# Patient Record
Sex: Male | Born: 1955 | Race: White | Hispanic: No | Smoking: Never smoker
Health system: Southern US, Community
[De-identification: ages and names within clinical notes are randomized; demographics above are authoritative.]

## PROBLEM LIST (undated history)

## (undated) DIAGNOSIS — K625 Hemorrhage of anus and rectum: Secondary | ICD-10-CM

## (undated) DIAGNOSIS — C099 Malignant neoplasm of tonsil, unspecified: Secondary | ICD-10-CM

## (undated) DIAGNOSIS — F1910 Other psychoactive substance abuse, uncomplicated: Secondary | ICD-10-CM

## (undated) DIAGNOSIS — F10951 Alcohol use, unspecified with alcohol-induced psychotic disorder with hallucinations: Secondary | ICD-10-CM

## (undated) DIAGNOSIS — C3490 Malignant neoplasm of unspecified part of unspecified bronchus or lung: Secondary | ICD-10-CM

## (undated) DIAGNOSIS — R5383 Other fatigue: Secondary | ICD-10-CM

## (undated) DIAGNOSIS — N529 Male erectile dysfunction, unspecified: Secondary | ICD-10-CM

## (undated) DIAGNOSIS — E7800 Pure hypercholesterolemia, unspecified: Secondary | ICD-10-CM

## (undated) DIAGNOSIS — C8590 Non-Hodgkin lymphoma, unspecified, unspecified site: Secondary | ICD-10-CM

## (undated) DIAGNOSIS — Z8701 Personal history of pneumonia (recurrent): Secondary | ICD-10-CM

## (undated) DIAGNOSIS — I10 Essential (primary) hypertension: Secondary | ICD-10-CM

## (undated) DIAGNOSIS — F10251 Alcohol dependence with alcohol-induced psychotic disorder with hallucinations: Secondary | ICD-10-CM

## (undated) HISTORY — DX: Other fatigue: R53.83

## (undated) HISTORY — DX: Malignant neoplasm of tonsil, unspecified: C09.9

## (undated) HISTORY — DX: Other psychoactive substance abuse, uncomplicated: F19.10

## (undated) HISTORY — DX: Hemorrhage of anus and rectum: K62.5

## (undated) HISTORY — DX: Alcohol use, unspecified with alcohol-induced psychotic disorder with hallucinations: F10.951

## (undated) HISTORY — DX: Personal history of pneumonia (recurrent): Z87.01

## (undated) HISTORY — PX: JOINT REPLACEMENT: SHX530

## (undated) HISTORY — DX: Alcohol dependence with alcohol-induced psychotic disorder with hallucinations: F10.251

---
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
## 2006-10-11 DIAGNOSIS — C8590 Non-Hodgkin lymphoma, unspecified, unspecified site: Secondary | ICD-10-CM

## 2006-10-11 HISTORY — PX: TONSILLECTOMY: SUR1361

## 2006-10-11 HISTORY — DX: Non-Hodgkin lymphoma, unspecified, unspecified site: C85.90

## 2007-10-12 HISTORY — PX: REPLACEMENT TOTAL KNEE BILATERAL: SUR1225

## 2017-10-08 ENCOUNTER — Encounter: Payer: Self-pay | Admitting: Gynecology

## 2017-10-08 ENCOUNTER — Other Ambulatory Visit: Payer: Self-pay

## 2017-10-08 ENCOUNTER — Ambulatory Visit
Admission: EM | Admit: 2017-10-08 | Discharge: 2017-10-08 | Disposition: A | Discharge status: Home / Self Care | Payer: Self-pay | Attending: Family Medicine | Admitting: Family Medicine

## 2017-10-08 DIAGNOSIS — E785 Hyperlipidemia, unspecified: Secondary | ICD-10-CM

## 2017-10-08 DIAGNOSIS — M25511 Pain in right shoulder: Secondary | ICD-10-CM

## 2017-10-08 DIAGNOSIS — G8929 Other chronic pain: Secondary | ICD-10-CM

## 2017-10-08 DIAGNOSIS — B001 Herpesviral vesicular dermatitis: Secondary | ICD-10-CM

## 2017-10-08 DIAGNOSIS — I10 Essential (primary) hypertension: Secondary | ICD-10-CM

## 2017-10-08 DIAGNOSIS — N528 Other male erectile dysfunction: Secondary | ICD-10-CM

## 2017-10-08 HISTORY — DX: Male erectile dysfunction, unspecified: N52.9

## 2017-10-08 HISTORY — DX: Essential (primary) hypertension: I10

## 2017-10-08 HISTORY — DX: Pure hypercholesterolemia, unspecified: E78.00

## 2017-10-08 MED ORDER — TADALAFIL 5 MG PO TABS: 5.0000 mg | ORAL_TABLET | Freq: Every day | ORAL | 0 refills | Status: DC | PRN

## 2017-10-08 MED ORDER — ATORVASTATIN CALCIUM 40 MG PO TABS: 40.0000 mg | ORAL_TABLET | Freq: Every day | ORAL | 0 refills | Status: DC

## 2017-10-08 MED ORDER — BENAZEPRIL HCL 40 MG PO TABS: 40.0000 mg | ORAL_TABLET | Freq: Every day | ORAL | 0 refills | Status: DC

## 2017-10-08 MED ORDER — AMLODIPINE BESYLATE 10 MG PO TABS: 10.0000 mg | ORAL_TABLET | Freq: Every day | ORAL | 0 refills | Status: DC

## 2017-10-08 MED ORDER — ACYCLOVIR 400 MG PO TABS: 400.0000 mg | ORAL_TABLET | Freq: Every day | ORAL | 0 refills | Status: DC

## 2017-10-08 MED ORDER — TRAMADOL HCL 50 MG PO TABS: 50.0000 mg | ORAL_TABLET | Freq: Four times a day (QID) | ORAL | 0 refills | Status: DC | PRN

## 2017-10-08 NOTE — Discharge Instructions (Signed)
Establish care with a Primary Care Physician/Provider ASAP for chronic medical conditions management and follow up

## 2017-10-08 NOTE — ED Provider Notes (Signed)
[facility]    CSN: [id number] Arrival date & time: 10/08/17  1145     History   Chief Complaint Chief Complaint  Patient presents with  . Shoulder Pain  . Medication Refill    HPI George Wade is a 61 y.o. male.   61 yo male here with a c/o medication refills on his chronic medications for his chronic conditions. Patient states he just moved here and has not set up care with a PCP but is out of his chronic meds. States takes tramadol for a right shoulder rotator cuff tear work injury. (brings in note from out of state orthopedist patient was seeing). Chronic medical problems as per note.    The history is provided by the patient.  Shoulder Pain  Medication Refill    Past Medical History:  Diagnosis Date  . ED (erectile dysfunction)   . Hypercholesteremia   . Hypertension     There are no active problems to display for this patient.   History reviewed. No pertinent surgical history.     Home Medications    Prior to Admission medications   Medication Sig Start Date End Date Taking? Authorizing Provider  acyclovir (ZOVIRAX) 400 MG tablet Take 1 tablet (400 mg total) by mouth 5 (five) times daily. 10/08/17   Norval Gable, MD  amLODipine (NORVASC) 10 MG tablet Take 1 tablet (10 mg total) by mouth daily. 10/08/17   Norval Gable, MD  atorvastatin (LIPITOR) 40 MG tablet Take 1 tablet (40 mg total) by mouth daily. 10/08/17   Norval Gable, MD  benazepril (LOTENSIN) 40 MG tablet Take 1 tablet (40 mg total) by mouth daily. 10/08/17   Norval Gable, MD  tadalafil (CIALIS) 5 MG tablet Take 1 tablet (5 mg total) by mouth daily as needed for erectile dysfunction. 10/08/17   Norval Gable, MD  traMADol (ULTRAM) 50 MG tablet Take 1 tablet (50 mg total) by mouth every 6 (six) hours as needed. 10/08/17   Norval Gable, MD    Family History Family History  Problem Relation Age of Onset  . Hypertension Father     Social History Social History    Tobacco Use  . Smoking status: Never Smoker  . Smokeless tobacco: Never Used  Substance Use Topics  . Alcohol use: Yes  . Drug use: No     Allergies   Patient has no known allergies.   Review of Systems Review of Systems   Physical Exam Triage Vital Signs ED Triage Vitals  Enc Vitals Group     BP 10/08/17 1210 (!) 131/100     Pulse Rate 10/08/17 1210 97     Resp 10/08/17 1210 16     Temp 10/08/17 1210 99 F (37.2 C)     Temp Source 10/08/17 1210 Oral     SpO2 10/08/17 1210 98 %     Weight 10/08/17 1203 175 lb (79.4 kg)     Height 10/08/17 1203 5\' 9"  (1.753 m)     Head Circumference --      Peak Flow --      Pain Score 10/08/17 1203 7     Pain Loc --      Pain Edu? --      Excl. in [place]? --    No data found.  Updated Vital Signs BP (!) 131/100 (BP Location: Left Arm)   Pulse 97   Temp 99 F (37.2 C) (Oral)   Resp 16   Ht 5\' 9"  (1.753 m)  Wt 175 lb (79.4 kg)   SpO2 98%   BMI 25.84 kg/m   Visual Acuity Right Eye Distance:   Left Eye Distance:   Bilateral Distance:    Right Eye Near:   Left Eye Near:    Bilateral Near:     Physical Exam  Constitutional: He appears well-developed and well-nourished. No distress.  Cardiovascular: Normal rate, regular rhythm and normal heart sounds.  Pulmonary/Chest: Effort normal and breath sounds normal. No stridor. No respiratory distress. He has no wheezes. He has no rales.  Skin: He is not diaphoretic.  Nursing note and vitals reviewed.    UC Treatments / Results  Labs (all labs ordered are listed, but only abnormal results are displayed) Labs Reviewed - No data to display  EKG  EKG Interpretation None       Radiology No results found.  Procedures Procedures (including critical care time)  Medications Ordered in UC Medications - No data to display   Initial Impression / Assessment and Plan / UC Course  I have reviewed the triage vital signs and the nursing notes.  Pertinent labs & imaging  results that were available during my care of the patient were reviewed by me and considered in my medical decision making (see chart for details).       Final Clinical Impressions(s) / UC Diagnoses   Final diagnoses:  Essential hypertension  Hyperlipidemia, unspecified hyperlipidemia type  Chronic right shoulder pain  Other male erectile dysfunction  Recurrent cold sores    ED Discharge Orders        Ordered    acyclovir (ZOVIRAX) 400 MG tablet  5 times daily     10/08/17 1310    amLODipine (NORVASC) 10 MG tablet  Daily     10/08/17 1310    atorvastatin (LIPITOR) 40 MG tablet  Daily     10/08/17 1310    benazepril (LOTENSIN) 40 MG tablet  Daily     10/08/17 1310    traMADol (ULTRAM) 50 MG tablet  Every 6 hours PRN     10/08/17 1310    tadalafil (CIALIS) 5 MG tablet  Daily PRN     10/08/17 1310     1. diagnosis reviewed with patient 2. rx as per orders above; reviewed possible side effects, interactions, risks and benefits; meds refilled x 1 month only 3. Discussed with patient need to establish care with PCP locally for continuing management of his chronic medical problems  Controlled Substance Prescriptions [place] Controlled Substance Registry consulted? Not Applicable   Norval Gable, MD 10/08/17 1416
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
## 2017-12-09 ENCOUNTER — Inpatient Hospital Stay: Payer: Self-pay

## 2017-12-09 ENCOUNTER — Emergency Department: Payer: Self-pay

## 2017-12-09 ENCOUNTER — Other Ambulatory Visit: Payer: Self-pay

## 2017-12-09 ENCOUNTER — Encounter: Payer: Self-pay | Admitting: Emergency Medicine

## 2017-12-09 ENCOUNTER — Inpatient Hospital Stay
Admission: EM | Admit: 2017-12-09 | Discharge: 2017-12-12 | DRG: 897 | Disposition: A | Discharge status: Home / Self Care | Payer: Self-pay | Attending: Family Medicine | Admitting: Family Medicine

## 2017-12-09 DIAGNOSIS — E871 Hypo-osmolality and hyponatremia: Secondary | ICD-10-CM

## 2017-12-09 DIAGNOSIS — R0602 Shortness of breath: Secondary | ICD-10-CM | POA: Diagnosis present

## 2017-12-09 DIAGNOSIS — Z85118 Personal history of other malignant neoplasm of bronchus and lung: Secondary | ICD-10-CM

## 2017-12-09 DIAGNOSIS — Z79899 Other long term (current) drug therapy: Secondary | ICD-10-CM

## 2017-12-09 DIAGNOSIS — E876 Hypokalemia: Secondary | ICD-10-CM | POA: Diagnosis present

## 2017-12-09 DIAGNOSIS — I10 Essential (primary) hypertension: Secondary | ICD-10-CM | POA: Diagnosis present

## 2017-12-09 DIAGNOSIS — R911 Solitary pulmonary nodule: Secondary | ICD-10-CM | POA: Diagnosis present

## 2017-12-09 DIAGNOSIS — F10239 Alcohol dependence with withdrawal, unspecified: Principal | ICD-10-CM | POA: Diagnosis present

## 2017-12-09 DIAGNOSIS — Y92239 Unspecified place in hospital as the place of occurrence of the external cause: Secondary | ICD-10-CM | POA: Diagnosis present

## 2017-12-09 DIAGNOSIS — E785 Hyperlipidemia, unspecified: Secondary | ICD-10-CM | POA: Diagnosis present

## 2017-12-09 DIAGNOSIS — W19XXXA Unspecified fall, initial encounter: Secondary | ICD-10-CM | POA: Diagnosis not present

## 2017-12-09 DIAGNOSIS — Z23 Encounter for immunization: Secondary | ICD-10-CM

## 2017-12-09 DIAGNOSIS — F10939 Alcohol use, unspecified with withdrawal, unspecified: Secondary | ICD-10-CM | POA: Diagnosis present

## 2017-12-09 DIAGNOSIS — F1010 Alcohol abuse, uncomplicated: Secondary | ICD-10-CM

## 2017-12-09 HISTORY — DX: Malignant neoplasm of unspecified part of unspecified bronchus or lung: C34.90

## 2017-12-09 LAB — INFLUENZA PANEL BY PCR (TYPE A & B)
INFLAPCR: NEGATIVE
Influenza B By PCR: NEGATIVE

## 2017-12-09 LAB — CBC
HCT: 37.7 % — ABNORMAL LOW (ref 40.0–52.0)
Hemoglobin: 13.2 g/dL (ref 13.0–18.0)
MCH: 33.3 pg (ref 26.0–34.0)
MCHC: 35 g/dL (ref 32.0–36.0)
MCV: 95.4 fL (ref 80.0–100.0)
Platelets: 191 10*3/uL (ref 150–440)
RBC: 3.95 MIL/uL — ABNORMAL LOW (ref 4.40–5.90)
RDW: 13.4 % (ref 11.5–14.5)
WBC: 8.3 10*3/uL (ref 3.8–10.6)

## 2017-12-09 LAB — COMPREHENSIVE METABOLIC PANEL
ALT: 89 U/L — ABNORMAL HIGH (ref 17–63)
ANION GAP: 12 (ref 5–15)
AST: 97 U/L — ABNORMAL HIGH (ref 15–41)
Albumin: 3.9 g/dL (ref 3.5–5.0)
Alkaline Phosphatase: 61 U/L (ref 38–126)
BUN: 11 mg/dL (ref 6–20)
CHLORIDE: 87 mmol/L — AB (ref 101–111)
CO2: 24 mmol/L (ref 22–32)
Calcium: 8.6 mg/dL — ABNORMAL LOW (ref 8.9–10.3)
Creatinine, Ser: 0.71 mg/dL (ref 0.61–1.24)
Glucose, Bld: 136 mg/dL — ABNORMAL HIGH (ref 65–99)
POTASSIUM: 3.1 mmol/L — AB (ref 3.5–5.1)
Sodium: 123 mmol/L — ABNORMAL LOW (ref 135–145)
Total Bilirubin: 1.6 mg/dL — ABNORMAL HIGH (ref 0.3–1.2)
Total Protein: 6.6 g/dL (ref 6.5–8.1)

## 2017-12-09 LAB — URINE DRUG SCREEN, QUALITATIVE (ARMC ONLY)
AMPHETAMINES, UR SCREEN: NOT DETECTED
BENZODIAZEPINE, UR SCRN: NOT DETECTED
Barbiturates, Ur Screen: NOT DETECTED
Cannabinoid 50 Ng, Ur ~~LOC~~: NOT DETECTED
Cocaine Metabolite,Ur ~~LOC~~: NOT DETECTED
MDMA (ECSTASY) UR SCREEN: NOT DETECTED
Methadone Scn, Ur: NOT DETECTED
Opiate, Ur Screen: NOT DETECTED
Phencyclidine (PCP) Ur S: NOT DETECTED
TRICYCLIC, UR SCREEN: NOT DETECTED

## 2017-12-09 LAB — MAGNESIUM: Magnesium: 1.6 mg/dL — ABNORMAL LOW (ref 1.7–2.4)

## 2017-12-09 LAB — TSH: TSH: 1.186 u[IU]/mL (ref 0.350–4.500)

## 2017-12-09 LAB — ETHANOL: Alcohol, Ethyl (B): <10 mg/dL (ref <10)

## 2017-12-09 LAB — TROPONIN I: Troponin I: 0.03 ng/mL (ref <0.03)

## 2017-12-09 MED ORDER — PHENOL 1.4 % MT LIQD
1.0000 | OROMUCOSAL | Status: DC | PRN
  Filled 2017-12-09: qty 177

## 2017-12-09 MED ORDER — AMLODIPINE BESYLATE 5 MG PO TABS
ORAL_TABLET | ORAL | Status: AC
  Administered 2017-12-09: 10 mg via ORAL
  Filled 2017-12-09: qty 2

## 2017-12-09 MED ORDER — ACETAMINOPHEN 650 MG RE SUPP: 650.0000 mg | Freq: Four times a day (QID) | RECTAL | Status: DC | PRN

## 2017-12-09 MED ORDER — ONDANSETRON HCL 4 MG/2ML IJ SOLN: 4.0000 mg | Freq: Four times a day (QID) | INTRAMUSCULAR | Status: DC | PRN

## 2017-12-09 MED ORDER — POTASSIUM CHLORIDE IN NACL 40-0.9 MEQ/L-% IV SOLN
INTRAVENOUS | Status: DC
  Administered 2017-12-09 – 2017-12-10 (×3): 125 mL/h via INTRAVENOUS
  Filled 2017-12-09 (×5): qty 1000

## 2017-12-09 MED ORDER — POTASSIUM CHLORIDE CRYS ER 20 MEQ PO TBCR
40.0000 meq | EXTENDED_RELEASE_TABLET | ORAL | Status: AC
  Administered 2017-12-09 (×2): 40 meq via ORAL
  Filled 2017-12-09: qty 2

## 2017-12-09 MED ORDER — LORAZEPAM 1 MG PO TABS
1.0000 mg | ORAL_TABLET | Freq: Four times a day (QID) | ORAL | Status: AC | PRN
  Administered 2017-12-11 – 2017-12-12 (×2): 1 mg via ORAL
  Filled 2017-12-09 (×4): qty 1

## 2017-12-09 MED ORDER — ENOXAPARIN SODIUM 40 MG/0.4ML ~~LOC~~ SOLN
40.0000 mg | SUBCUTANEOUS | Status: DC
  Administered 2017-12-09 – 2017-12-11 (×3): 40 mg via SUBCUTANEOUS
  Filled 2017-12-09 (×3): qty 0.4

## 2017-12-09 MED ORDER — IOPAMIDOL (ISOVUE-300) INJECTION 61%
75.0000 mL | Freq: Once | INTRAVENOUS | Status: AC | PRN
  Administered 2017-12-09: 75 mL via INTRAVENOUS

## 2017-12-09 MED ORDER — LORAZEPAM 2 MG/ML IJ SOLN
1.0000 mg | Freq: Four times a day (QID) | INTRAMUSCULAR | Status: AC | PRN
  Administered 2017-12-09 – 2017-12-11 (×4): 1 mg via INTRAVENOUS
  Filled 2017-12-09 (×3): qty 1

## 2017-12-09 MED ORDER — ADULT MULTIVITAMIN W/MINERALS CH
1.0000 | ORAL_TABLET | Freq: Every day | ORAL | Status: DC
  Administered 2017-12-09 – 2017-12-12 (×4): 1 via ORAL
  Filled 2017-12-09 (×3): qty 1

## 2017-12-09 MED ORDER — FOLIC ACID 1 MG PO TABS
1.0000 mg | ORAL_TABLET | Freq: Every day | ORAL | Status: DC
  Administered 2017-12-09 – 2017-12-12 (×4): 1 mg via ORAL
  Filled 2017-12-09 (×3): qty 1

## 2017-12-09 MED ORDER — FOLIC ACID 1 MG PO TABS
ORAL_TABLET | ORAL | Status: AC
  Administered 2017-12-09: 1 mg via ORAL
  Filled 2017-12-09: qty 1

## 2017-12-09 MED ORDER — TRAMADOL HCL 50 MG PO TABS: 50.0000 mg | ORAL_TABLET | Freq: Four times a day (QID) | ORAL | Status: DC | PRN

## 2017-12-09 MED ORDER — INFLUENZA VAC SPLIT QUAD 0.5 ML IM SUSY
0.5000 mL | PREFILLED_SYRINGE | INTRAMUSCULAR | Status: AC
  Administered 2017-12-11: 0.5 mL via INTRAMUSCULAR
  Filled 2017-12-09: qty 0.5

## 2017-12-09 MED ORDER — BENAZEPRIL HCL 40 MG PO TABS: 40.0000 mg | ORAL_TABLET | Freq: Every day | ORAL | 0 refills | Status: DC

## 2017-12-09 MED ORDER — LORAZEPAM 2 MG PO TABS
0.0000 mg | ORAL_TABLET | Freq: Four times a day (QID) | ORAL | Status: AC
  Administered 2017-12-09 (×2): 2 mg via ORAL
  Administered 2017-12-10: 1 mg via ORAL
  Administered 2017-12-10: 2 mg via ORAL
  Filled 2017-12-09 (×4): qty 1

## 2017-12-09 MED ORDER — CEPASTAT 14.5 MG MT LOZG
1.0000 | LOZENGE | OROMUCOSAL | Status: DC | PRN
  Administered 2017-12-09: 1 via BUCCAL
  Filled 2017-12-09: qty 9

## 2017-12-09 MED ORDER — THIAMINE HCL 100 MG/ML IJ SOLN: 100.0000 mg | Freq: Every day | INTRAMUSCULAR | Status: DC

## 2017-12-09 MED ORDER — BENAZEPRIL HCL 20 MG PO TABS
40.0000 mg | ORAL_TABLET | Freq: Every day | ORAL | Status: DC
  Administered 2017-12-10 – 2017-12-12 (×3): 40 mg via ORAL
  Filled 2017-12-09 (×4): qty 2

## 2017-12-09 MED ORDER — SODIUM CHLORIDE 0.9 % IV SOLN
Freq: Once | INTRAVENOUS | Status: AC
  Administered 2017-12-09: 11:00:00 via INTRAVENOUS

## 2017-12-09 MED ORDER — ADULT MULTIVITAMIN W/MINERALS CH
ORAL_TABLET | ORAL | Status: AC
  Administered 2017-12-09: 1 via ORAL
  Filled 2017-12-09: qty 1

## 2017-12-09 MED ORDER — ATORVASTATIN CALCIUM 40 MG PO TABS: 40.0000 mg | ORAL_TABLET | Freq: Every day | ORAL | 0 refills | Status: DC

## 2017-12-09 MED ORDER — LORAZEPAM 1 MG PO TABS
1.0000 mg | ORAL_TABLET | Freq: Once | ORAL | Status: AC
  Administered 2017-12-09: 1 mg via ORAL
  Filled 2017-12-09: qty 1

## 2017-12-09 MED ORDER — POTASSIUM CHLORIDE CRYS ER 20 MEQ PO TBCR
EXTENDED_RELEASE_TABLET | ORAL | Status: AC
  Administered 2017-12-09: 40 meq via ORAL
  Filled 2017-12-09: qty 2

## 2017-12-09 MED ORDER — ATORVASTATIN CALCIUM 20 MG PO TABS
40.0000 mg | ORAL_TABLET | Freq: Every day | ORAL | Status: DC
  Administered 2017-12-09 – 2017-12-11 (×3): 40 mg via ORAL
  Filled 2017-12-09 (×3): qty 2

## 2017-12-09 MED ORDER — VITAMIN B-1 100 MG PO TABS
100.0000 mg | ORAL_TABLET | Freq: Every day | ORAL | Status: DC
  Administered 2017-12-09 – 2017-12-12 (×4): 100 mg via ORAL
  Filled 2017-12-09 (×3): qty 1

## 2017-12-09 MED ORDER — LORAZEPAM 2 MG/ML IJ SOLN
0.0000 mg | Freq: Two times a day (BID) | INTRAMUSCULAR | Status: DC
  Administered 2017-12-11: 2 mg via INTRAVENOUS
  Filled 2017-12-09: qty 1

## 2017-12-09 MED ORDER — AMLODIPINE BESYLATE 10 MG PO TABS: 10.0000 mg | ORAL_TABLET | Freq: Every day | ORAL | 0 refills | Status: DC

## 2017-12-09 MED ORDER — AMLODIPINE BESYLATE 10 MG PO TABS
10.0000 mg | ORAL_TABLET | Freq: Every day | ORAL | Status: DC
  Administered 2017-12-09 – 2017-12-12 (×4): 10 mg via ORAL
  Filled 2017-12-09 (×3): qty 1

## 2017-12-09 MED ORDER — ONDANSETRON HCL 4 MG PO TABS: 4.0000 mg | ORAL_TABLET | Freq: Four times a day (QID) | ORAL | Status: DC | PRN

## 2017-12-09 MED ORDER — LORAZEPAM 2 MG/ML IJ SOLN
0.0000 mg | Freq: Four times a day (QID) | INTRAMUSCULAR | Status: AC
  Administered 2017-12-10 (×2): 2 mg via INTRAVENOUS
  Administered 2017-12-11: 1 mg via INTRAVENOUS
  Filled 2017-12-09 (×4): qty 1

## 2017-12-09 MED ORDER — PANTOPRAZOLE SODIUM 40 MG PO TBEC
40.0000 mg | DELAYED_RELEASE_TABLET | Freq: Every day | ORAL | Status: DC
  Administered 2017-12-09 – 2017-12-12 (×4): 40 mg via ORAL
  Filled 2017-12-09 (×5): qty 1

## 2017-12-09 MED ORDER — ACETAMINOPHEN 325 MG PO TABS: 650.0000 mg | ORAL_TABLET | Freq: Four times a day (QID) | ORAL | Status: DC | PRN

## 2017-12-09 MED ORDER — LORAZEPAM 2 MG PO TABS
0.0000 mg | ORAL_TABLET | Freq: Two times a day (BID) | ORAL | Status: DC
  Administered 2017-12-12: 1 mg via ORAL

## 2017-12-09 MED ORDER — VITAMIN B-1 100 MG PO TABS
100.0000 mg | ORAL_TABLET | Freq: Every day | ORAL | Status: DC
  Filled 2017-12-09: qty 1
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
## 2017-12-09 NOTE — Clinical Social Work Note (Signed)
Clinical Social Work Assessment  Patient Details  Name: George Wade MRN: [id number] Date of Birth: 09-Dec-1955  Date of referral:  12/09/17               Reason for consult:  [employer], Substance Use/ETOH Abuse                Permission sought to share information with:  George Wade granted to share information::  Yes, Verbal Permission Granted  Name::        George Wade::     Relationship::     Contact Information:     Housing/Transportation Living arrangements for the past 2 months:  Single Family Home Source of Information:  Patient Patient Interpreter Needed:  None Criminal Activity/Legal Involvement Pertinent to Current Situation/Hospitalization:  No - Comment as needed Significant Relationships:  Spouse, Friend Lives with:  Spouse, Friends Do you feel safe going back to the place where you live?  Yes Need for family participation in patient care:  Yes (Comment)  Care giving concerns:  Patient lives in [place] with his Wade George Wade and his friend George Wade and George Wade.    Social Worker assessment / plan:  Holiday representative (CSW) received substance abuse consult. Patient came to [facility] to detox from alcohol. CSW met with patient alone at bedside to address consult. Patient appeared to be trembling and stated that he did not feel well. Patient appeared impulsive and was getting out of bed. Patient reported that he lives in [place] and he wants to go to [facility] for detox. CSW explained that [facility] does not usually accept patient's from being admitted inpatient in the hospital because they are done with detox. Patient also asked about [facility] in [facility] and stated that he wants to get help for alcohol abuse. CSW gave patient [facility] resources including [facility] in [place]. Patient requested to call [facility] and used CSW's phone to do so. Patient completed a telephone interview with George Wade at [facility]. Per  [facility] residents are harder to get resources for in [place]. It is unlikely that patient will get into [facility] in [place]. CSW explained inpatient substance abuse rehab options. Per patient he does not have insurance and can't private pay for rehab. CSW explained that [facility] and is an inpatient rehab that accepts patients without insurance. Patient stated that he is not sure what he wants to do at this time. CSW provided emotional support. CSW will continue to follow and assist as needed.   Employment status:  Disabled (Comment on whether or not currently receiving Disability), Unemployed Insurance information:  Self Pay (Medicaid Pending) PT Recommendations:  Not assessed at this time Information / Referral to community resources:  Residential Substance Abuse Treatment Options, Outpatient Substance Abuse Treatment Options  Patient/Family's Response to care:  Patient is unsure of what he wants to do.   Patient/Family's Understanding of and Emotional Response to Diagnosis, Current Treatment, and Prognosis:  Patient stated that he was told me might have cancer. CSW provided emotional support.   Emotional Assessment Appearance:  Appears stated age Attitude/Demeanor/Rapport:    Affect (typically observed):  Restless, Hopeful Orientation:  Oriented to Self, Oriented to  Time, Fluctuating Orientation (Suspected and/or reported Sundowners) Alcohol / Substance use:  Alcohol Use Psych involvement (Current and /or in the community):  No (Comment)  Discharge Needs  Concerns to be addressed:  Discharge Planning Concerns  Readmission within the last 30 days:  No Current discharge risk:  Substance Abuse Barriers to Discharge:  Continued Medical Work up   [employer], Veronia Beets, LCSW 12/09/2017, 3:26 PM

## 2017-12-09 NOTE — ED Triage Notes (Addendum)
Patient ambulatory to triage with steady gait, without difficulty or distress noted; pt reports here for alcohol detox; denies SI or HI; st last drink hr ago

## 2017-12-09 NOTE — ED Notes (Signed)
Troponin of 0.03 reported to Dr Thomasene Mohair with verbal acknowledgement.

## 2017-12-09 NOTE — ED Provider Notes (Addendum)
[facility] Emergency Department Provider Note  Time seen: 8:45 AM  I have reviewed the triage vital signs and the nursing notes.   HISTORY  Chief Complaint Alcohol Problem    HPI George Wade is a 62 y.o. male with a past medical history of hypertension, alcohol use, presents to the emergency department for detox.  According to the patient he has been drinking alcohol since he was 62 years old, has never detoxed from alcohol.  Patient states he wishes to detox from alcohol, last drink approximately 2 hours ago.  Denies any recreational drug use.  Patient takes medications for hypertension but ran out 2 or 3 weeks ago.  Has no medical complaints today.  Denies any withdrawal seizure history, states he will occasionally get shaky if he does not drink but states this is fairly mild.  Past Medical History:  Diagnosis Date  . ED (erectile dysfunction)   . Hypercholesteremia   . Hypertension     There are no active problems to display for this patient.   History reviewed. No pertinent surgical history.  Prior to Admission medications   Medication Sig Start Date End Date Taking? Authorizing Provider  acyclovir (ZOVIRAX) 400 MG tablet Take 1 tablet (400 mg total) by mouth 5 (five) times daily. 10/08/17   Norval Gable, MD  amLODipine (NORVASC) 10 MG tablet Take 1 tablet (10 mg total) by mouth daily. 10/08/17   Norval Gable, MD  atorvastatin (LIPITOR) 40 MG tablet Take 1 tablet (40 mg total) by mouth daily. 10/08/17   Norval Gable, MD  benazepril (LOTENSIN) 40 MG tablet Take 1 tablet (40 mg total) by mouth daily. 10/08/17   Norval Gable, MD  tadalafil (CIALIS) 5 MG tablet Take 1 tablet (5 mg total) by mouth daily as needed for erectile dysfunction. 10/08/17   Norval Gable, MD  traMADol (ULTRAM) 50 MG tablet Take 1 tablet (50 mg total) by mouth every 6 (six) hours as needed. 10/08/17   Norval Gable, MD    No Known Allergies  Family History  Problem  Relation Age of Onset  . Hypertension Father     Social History Social History   Tobacco Use  . Smoking status: Never Smoker  . Smokeless tobacco: Never Used  Substance Use Topics  . Alcohol use: Yes    Comment: daily   . Drug use: No    Review of Systems Constitutional: Negative for fever. Eyes: Negative for visual complaints ENT: Negative for recent illness/congestion Cardiovascular: Negative for chest pain. Respiratory: Negative for cough or congestion Gastrointestinal: Negative for abdominal pain, vomiting Genitourinary: Negative for urinary compaints Musculoskeletal: Negative for musculoskeletal complaints Skin: Negative for skin complaints  Neurological: Negative for headache All other ROS negative  ____________________________________________   PHYSICAL EXAM:  VITAL SIGNS: ED Triage Vitals  Enc Vitals Group     BP 12/09/17 0653 (!) 129/96     Pulse Rate 12/09/17 0653 (!) 109     Resp 12/09/17 0653 20     Temp 12/09/17 0653 97.8 F (36.6 C)     Temp Source 12/09/17 0653 Oral     SpO2 12/09/17 0653 97 %     Weight 12/09/17 0652 170 lb (77.1 kg)     Height 12/09/17 0652 5\' 9"  (1.753 m)     Head Circumference --      Peak Flow --      Pain Score --      Pain Loc --      Pain Edu? --  Excl. in [place]? --    Constitutional: Alert and oriented. Well appearing and in no distress. Eyes: Normal exam ENT   Head: Normocephalic and atraumatic.   Mouth/Throat: Mucous membranes are moist. Cardiovascular: Normal rate, regular rhythm. No murmur Respiratory: Normal respiratory effort without tachypnea nor retractions. Breath sounds are clear  Gastrointestinal: Soft and nontender. No distention.  Musculoskeletal: Nontender with normal range of motion in all extremities.  Neurologic:  Normal speech and language. No gross focal neurologic deficits Skin:  Skin is warm, dry and intact.  Psychiatric: Mood and affect are normal.    ____________________________________________   INITIAL IMPRESSION / ASSESSMENT AND PLAN / ED COURSE  Pertinent labs & imaging results that were available during my care of the patient were reviewed by me and considered in my medical decision making (see chart for details).  Patient presents the emergency department with alcohol use disorder hoping to detox.  Differential includes substance use, alcohol use, and complicated detox.  We will check basic labs and discuss with behavioral health to see if the patient could be placed into a detox facility.  Currently the patient appears well, no distress he has no medical complaints at this time.  We will also refill the patient's blood pressure medications for him.  His labs have resulted with a significant hyponatremia 123.  I brought this up to the patient he states he has been feeling very weak over the past several weeks and shaky at times which he thought could have been due to alcohol.  Given the patient's hyponatremia we will admit to the hospitalist service.  I will order [place] protocols for the patient.   Interpreted by myself shows sinus tachycardia 107 bpm with a narrow QRS, normal axis, normal intervals, nonspecific ST changes. ____________________________________________   FINAL CLINICAL IMPRESSION(S) / ED DIAGNOSES  Alcohol use disorder Hyponatremia   Harvest Dark, MD 12/09/17 1031    Harvest Dark, MD 12/09/17 [id number]

## 2017-12-09 NOTE — ED Notes (Signed)
Pt reports that he is feeling nauseated.

## 2017-12-09 NOTE — Progress Notes (Signed)
Patient wants to know what this "exercise" is. Wants to know why all the people are taking equipment apart and putting it back together.  Reports he can hear them talking.  When he extends his arms he has no tremor, yet when trying to eat he has severe tremors and is having trouble eating because of it.  Assisted him to the bathroom.  He is very unsteady on his feet.

## 2017-12-09 NOTE — Progress Notes (Signed)
Patient has been alert and oriented with no complaints of pain. Ativan given twice as patient was becoming more restless, agitated and having tremors. Up to bathroom with assistance as patient's gait was a little unsteady. Patient with concerns about the results of his chest imaging and has questions for Dr. Posey Pronto. Care signed off to Marya Fossa, RN

## 2017-12-10 LAB — BASIC METABOLIC PANEL
ANION GAP: 7 (ref 5–15)
BUN: 8 mg/dL (ref 6–20)
CALCIUM: 8.3 mg/dL — AB (ref 8.9–10.3)
CO2: 22 mmol/L (ref 22–32)
Chloride: 104 mmol/L (ref 101–111)
Creatinine, Ser: 0.61 mg/dL (ref 0.61–1.24)
Glucose, Bld: 119 mg/dL — ABNORMAL HIGH (ref 65–99)
POTASSIUM: 4.1 mmol/L (ref 3.5–5.1)
SODIUM: 133 mmol/L — AB (ref 135–145)

## 2017-12-10 LAB — CBC
HCT: 34.5 % — ABNORMAL LOW (ref 40.0–52.0)
Hemoglobin: 12 g/dL — ABNORMAL LOW (ref 13.0–18.0)
MCH: 33.5 pg (ref 26.0–34.0)
MCHC: 34.8 g/dL (ref 32.0–36.0)
MCV: 96.2 fL (ref 80.0–100.0)
PLATELETS: 153 10*3/uL (ref 150–440)
RBC: 3.59 MIL/uL — AB (ref 4.40–5.90)
RDW: 13.5 % (ref 11.5–14.5)
WBC: 8.2 10*3/uL (ref 3.8–10.6)

## 2017-12-10 MED ORDER — HYDROCODONE-ACETAMINOPHEN 5-325 MG PO TABS
1.0000 | ORAL_TABLET | Freq: Four times a day (QID) | ORAL | Status: DC | PRN
  Administered 2017-12-10 – 2017-12-12 (×6): 1 via ORAL
  Filled 2017-12-10 (×7): qty 1

## 2017-12-10 NOTE — Progress Notes (Signed)
[facility] [place] NAME: George Wade    MR#:  [id number]  DATE OF BIRTH:  1956/01/19  SUBJECTIVE:  CHIEF COMPLAINT:   Chief Complaint  Patient presents with  . Alcohol Problem  Patient evaluated with nurse present, continued jitteriness/restlessness, problems with vision-made better with Ativan per patient, discontinue IV fluids  REVIEW OF SYSTEMS:  CONSTITUTIONAL: No fever, fatigue or weakness.  EYES: No blurred or double vision.  EARS, NOSE, AND THROAT: No tinnitus or ear pain.  RESPIRATORY: No cough, shortness of breath, wheezing or hemoptysis.  CARDIOVASCULAR: No chest pain, orthopnea, edema.  GASTROINTESTINAL: No nausea, vomiting, diarrhea or abdominal pain.  GENITOURINARY: No dysuria, hematuria.  ENDOCRINE: No polyuria, nocturia,  HEMATOLOGY: No anemia, easy bruising or bleeding SKIN: No rash or lesion. MUSCULOSKELETAL: No joint pain or arthritis.   NEUROLOGIC: No tingling, numbness, weakness.  PSYCHIATRY: No anxiety or depression.   ROS  DRUG ALLERGIES:  No Known Allergies  VITALS:  Blood pressure 129/83, pulse 95, temperature 98.6 F (37 C), resp. rate 18, height 5\' 9"  (1.753 m), weight 77.1 kg (170 lb), SpO2 94 %.  PHYSICAL EXAMINATION:  GENERAL:  62 y.o.-year-old patient lying in the bed with no acute distress.  EYES: Pupils equal, round, reactive to light and accommodation. No scleral icterus. Extraocular muscles intact.  HEENT: Head atraumatic, normocephalic. Oropharynx and nasopharynx clear.  NECK:  Supple, no jugular venous distention. No thyroid enlargement, no tenderness.  LUNGS: Normal breath sounds bilaterally, no wheezing, rales,rhonchi or crepitation. No use of accessory muscles of respiration.  CARDIOVASCULAR: S1, S2 normal. No murmurs, rubs, or gallops.  ABDOMEN: Soft, nontender, nondistended. Bowel sounds present. No organomegaly or mass.  EXTREMITIES: No pedal edema, cyanosis, or clubbing.   NEUROLOGIC: Cranial nerves II through XII are intact. Muscle strength 5/5 in all extremities. Sensation intact. Gait not checked.  PSYCHIATRIC: The patient is alert and oriented x 3.  SKIN: No obvious rash, lesion, or ulcer.   Physical Exam LABORATORY PANEL:   CBC Recent Labs  Lab 12/10/17 0343  WBC 8.2  HGB 12.0*  HCT 34.5*  PLT 153   ------------------------------------------------------------------------------------------------------------------  Chemistries  Recent Labs  Lab 12/09/17 0857 12/10/17 0343  NA 123* 133*  K 3.1* 4.1  CL 87* 104  CO2 24 22  GLUCOSE 136* 119*  BUN 11 8  CREATININE 0.71 0.61  CALCIUM 8.6* 8.3*  MG 1.6*  --   AST 97*  --   ALT 89*  --   ALKPHOS 61  --   BILITOT 1.6*  --    ------------------------------------------------------------------------------------------------------------------  Cardiac Enzymes Recent Labs  Lab 12/09/17 0857  TROPONINI 0.03*   ------------------------------------------------------------------------------------------------------------------  RADIOLOGY:  Dg Chest 2 View  Result Date: 12/09/2017 CLINICAL DATA:  Hyponatremia.  Small cell lung carcinoma EXAM: CHEST  2 VIEW COMPARISON:  None. FINDINGS: There is no edema or consolidation. There is mild bibasilar atelectasis. Heart size and pulmonary vascularity are normal. There is aortic atherosclerosis. There is soft tissue fullness in the right paratracheal region concerning for adenopathy. No other potential adenopathy. No bone lesions. IMPRESSION: Suspect right paratracheal region adenopathy. This area of opacity measures 4.6 x 2.2 cm. Contrast enhanced chest CT could confirm as clinically indicated. Mild bibasilar atelectasis. No edema or consolidation. There is aortic atherosclerosis. Aortic Atherosclerosis (ICD10-I70.0). Electronically Signed   By: Lowella Grip III M.D.   On: 12/09/2017 11:45   Ct Chest W Contrast  Result Date: 12/09/2017 CLINICAL DATA:   Abnormal chest  x-ray. Suspicion of right paratracheal adenopathy. EXAM: CT CHEST WITH CONTRAST TECHNIQUE: Multidetector CT imaging of the chest was performed during intravenous contrast administration. CONTRAST:  34mL ISOVUE-300 IOPAMIDOL (ISOVUE-300) INJECTION 61% COMPARISON:  None. FINDINGS: Cardiovascular: The heart is within normal limits in size. No pericardial effusion. There is mild tortuosity and calcification of the thoracic aorta. Age advanced three-vessel coronary artery calcifications are also noted. Mediastinum/Nodes: Small scattered mediastinal lymph nodes all measuring less than 8 mm. Right paratracheal node on image number 26 measures 7.5 mm. Lower right paratracheal node on image number 51 measures 6 mm. Subcarinal lymph node on image number 67 measures 7 mm. No right paratracheal lymphadenopathy. The findings on the x-ray likely due to mildly tortuous ectatic vessels. The esophagus is slightly dilated and contains some fluid and debris. There is a moderate-sized hiatal hernia and this could be due to reflux. Lungs/Pleura: Moderate breathing motion artifact. Suspect mild emphysematous changes. There is a vague ground-glass nodule in the right upper lobe on image number 49 measuring 13 x 9 mm. This is an indeterminate finding that will require follow-up. 5.5 mm superior segment right lower lobe pulmonary nodule on image number 60. Sub 4 mm right upper lobe pulmonary nodule on image number 65. Nodular density measuring 6.5 mm adjacent to the major fissure in the right lower lobe on image number 65 is likely a lymph node. Sub 4 mm right lower lobe pulmonary nodule on image number 69. 5 mm subpleural left lower lobe pulmonary nodule on image number 99. Upper Abdomen: The upper abdomen demonstrates diffuse and fairly marked fatty infiltration of the liver but no focal hepatic lesions. Musculoskeletal: The chest wall is unremarkable. No supraclavicular or axillary adenopathy. IMPRESSION: 1. No mediastinal  or hilar mass or overt adenopathy. Small scattered lymph nodes are noted. 2. 13 x 9 mm ground-glass nodule in the right upper lobe and several other smaller solid pulmonary nodules. Non-contrast chest CT at 3-6 months is recommended. If nodules persist, subsequent management will be based upon the most suspicious nodule(s). This recommendation follows the consensus statement: Guidelines for Management of Incidental Pulmonary Nodules Detected on CT Images: From the Fleischner Society 2017; Radiology 2017; 284:228-243. 3. Hiatal hernia and slightly dilated fluid-filled esophagus, likely due to reflux. 4. Age advanced coronary artery calcifications. 5. Diffuse and significant fatty infiltration of the liver. Aortic Atherosclerosis (ICD10-I70.0) and Emphysema (ICD10-J43.9). Electronically Signed   By: Marijo Sanes M.D.   On: 12/09/2017 12:49    ASSESSMENT AND PLAN:  Patient is a 62 year old white male with history of lung cancer and alcohol abuse presents to the ED for detox request  1.  Hyponatremia Nearly resolved likely related to alcohol abuse  Repleted with IV fluids for rehydration  2.  Hypokalemia likely related to alcohol abuse Repleted   3.  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    Acute on chronic alcoholism  Stable  Continue alcohol withdrawal protocol   5.  Essential hypertension, chronic Stable on Norvasc and benazepril  6.  Hyperlipidemia Stable on Lipitor  All the records are reviewed and case discussed with Care Management/Social Workerr. Management plans discussed with the patient, family and they are in agreement.  CODE STATUS: full  TOTAL TIME TAKING  CARE OF THIS PATIENT: 40 minutes.      POSSIBLE D/C IN 1-2 DAYS, DEPENDING ON CLINICAL CONDITION.   George Wade M.D on 12/10/2017   Between 7am to 6pm - Pager - [phone]  After 6pm go to [email or web address] - password [email or web address] [facility]  Office  [phone]  CC: Primary care physician; Patient, [name] Per  Note: This dictation was prepared with Dragon dictation along with smaller phrase technology. Any transcriptional errors that result from this process are unintentional.

## 2017-12-10 NOTE — Plan of Care (Signed)
  Progressing Coping: Level of anxiety will decrease 12/10/2017 2151 - Progressing by Loran Senters, RN Pain Managment: General experience of comfort will improve 12/10/2017 2151 - Progressing by Loran Senters, RN Physical Regulation: Complications related to the disease process, condition or treatment will be avoided or minimized 12/10/2017 2151 - Progressing by Loran Senters, RN Safety: Ability to remain free from injury will improve 12/10/2017 2151 - Progressing by Loran Senters, RN

## 2017-12-11 LAB — HIV ANTIBODY (ROUTINE TESTING W REFLEX): HIV Screen 4th Generation wRfx: NONREACTIVE

## 2017-12-11 MED ORDER — FLEET ENEMA 7-19 GM/118ML RE ENEM: 1.0000 | ENEMA | Freq: Once | RECTAL | Status: DC

## 2017-12-11 NOTE — Progress Notes (Signed)
Patient ambulated to BR with 1 assist and had BM

## 2017-12-11 NOTE — Clinical Social Work Note (Signed)
CSW met with the patient at bedside to discuss [facility] as an option for care. The patient indicated that he is not yet ready to make a decision regarding referral. The CSW provided the information about [facility] for the patient to consider including "What to Expect/What to Bring", the list of services offered, and how voluntary commitment would work as the patient is not appropriate for IVC. The patient thanked the CSW and plans to make a decision by tomorrow morning. CSW is following.  Santiago Bumpers, MSW, [name] [phone]

## 2017-12-11 NOTE — Progress Notes (Signed)
[facility] [place] NAME: George Wade    MR#:  [id number]  DATE OF BIRTH:  10-03-1956  SUBJECTIVE:  CHIEF COMPLAINT:   Chief Complaint  Patient presents with  . Alcohol Problem  Patient complains of a fall this morning with nursing staff present, continues to complain of vision changes-may better with Ativan per patient, continued restlessness/tremulousness, physical therapy to see, check orthostatics, ambulate with assistance  REVIEW OF SYSTEMS:  CONSTITUTIONAL: No fever, fatigue or weakness.  EYES: No blurred or double vision.  EARS, NOSE, AND THROAT: No tinnitus or ear pain.  RESPIRATORY: No cough, shortness of breath, wheezing or hemoptysis.  CARDIOVASCULAR: No chest pain, orthopnea, edema.  GASTROINTESTINAL: No nausea, vomiting, diarrhea or abdominal pain.  GENITOURINARY: No dysuria, hematuria.  ENDOCRINE: No polyuria, nocturia,  HEMATOLOGY: No anemia, easy bruising or bleeding SKIN: No rash or lesion. MUSCULOSKELETAL: No joint pain or arthritis.   NEUROLOGIC: No tingling, numbness, weakness.  PSYCHIATRY: No anxiety or depression.   ROS  DRUG ALLERGIES:  No Known Allergies  VITALS:  Blood pressure (!) 143/97, pulse 88, temperature 98 F (36.7 C), temperature source Oral, resp. rate 18, height 5\' 9"  (1.753 m), weight 77.1 kg (170 lb), SpO2 96 %.  PHYSICAL EXAMINATION:  GENERAL:  62 y.o.-year-old patient lying in the bed with no acute distress.  EYES: Pupils equal, round, reactive to light and accommodation. No scleral icterus. Extraocular muscles intact.  HEENT: Head atraumatic, normocephalic. Oropharynx and nasopharynx clear.  NECK:  Supple, no jugular venous distention. No thyroid enlargement, no tenderness.  LUNGS: Normal breath sounds bilaterally, no wheezing, rales,rhonchi or crepitation. No use of accessory muscles of respiration.  CARDIOVASCULAR: S1, S2 normal. No murmurs, rubs, or gallops.  ABDOMEN: Soft, nontender,  nondistended. Bowel sounds present. No organomegaly or mass.  EXTREMITIES: No pedal edema, cyanosis, or clubbing.  NEUROLOGIC: Cranial nerves II through XII are intact. Muscle strength 5/5 in all extremities. Sensation intact. Gait not checked.  PSYCHIATRIC: The patient is alert and oriented x 3.  SKIN: No obvious rash, lesion, or ulcer.   Physical Exam LABORATORY PANEL:   CBC Recent Labs  Lab 12/10/17 0343  WBC 8.2  HGB 12.0*  HCT 34.5*  PLT 153   ------------------------------------------------------------------------------------------------------------------  Chemistries  Recent Labs  Lab 12/09/17 0857 12/10/17 0343  NA 123* 133*  K 3.1* 4.1  CL 87* 104  CO2 24 22  GLUCOSE 136* 119*  BUN 11 8  CREATININE 0.71 0.61  CALCIUM 8.6* 8.3*  MG 1.6*  --   AST 97*  --   ALT 89*  --   ALKPHOS 61  --   BILITOT 1.6*  --    ------------------------------------------------------------------------------------------------------------------  Cardiac Enzymes Recent Labs  Lab 12/09/17 0857  TROPONINI 0.03*   ------------------------------------------------------------------------------------------------------------------  RADIOLOGY:  Dg Chest 2 View  Result Date: 12/09/2017 CLINICAL DATA:  Hyponatremia.  Small cell lung carcinoma EXAM: CHEST  2 VIEW COMPARISON:  None. FINDINGS: There is no edema or consolidation. There is mild bibasilar atelectasis. Heart size and pulmonary vascularity are normal. There is aortic atherosclerosis. There is soft tissue fullness in the right paratracheal region concerning for adenopathy. No other potential adenopathy. No bone lesions. IMPRESSION: Suspect right paratracheal region adenopathy. This area of opacity measures 4.6 x 2.2 cm. Contrast enhanced chest CT could confirm as clinically indicated. Mild bibasilar atelectasis. No edema or consolidation. There is aortic atherosclerosis. Aortic Atherosclerosis (ICD10-I70.0). Electronically Signed   By:  Lowella Grip III M.D.  On: 12/09/2017 11:45   Ct Chest W Contrast  Result Date: 12/09/2017 CLINICAL DATA:  Abnormal chest x-ray. Suspicion of right paratracheal adenopathy. EXAM: CT CHEST WITH CONTRAST TECHNIQUE: Multidetector CT imaging of the chest was performed during intravenous contrast administration. CONTRAST:  35mL ISOVUE-300 IOPAMIDOL (ISOVUE-300) INJECTION 61% COMPARISON:  None. FINDINGS: Cardiovascular: The heart is within normal limits in size. No pericardial effusion. There is mild tortuosity and calcification of the thoracic aorta. Age advanced three-vessel coronary artery calcifications are also noted. Mediastinum/Nodes: Small scattered mediastinal lymph nodes all measuring less than 8 mm. Right paratracheal node on image number 26 measures 7.5 mm. Lower right paratracheal node on image number 51 measures 6 mm. Subcarinal lymph node on image number 67 measures 7 mm. No right paratracheal lymphadenopathy. The findings on the x-ray likely due to mildly tortuous ectatic vessels. The esophagus is slightly dilated and contains some fluid and debris. There is a moderate-sized hiatal hernia and this could be due to reflux. Lungs/Pleura: Moderate breathing motion artifact. Suspect mild emphysematous changes. There is a vague ground-glass nodule in the right upper lobe on image number 49 measuring 13 x 9 mm. This is an indeterminate finding that will require follow-up. 5.5 mm superior segment right lower lobe pulmonary nodule on image number 60. Sub 4 mm right upper lobe pulmonary nodule on image number 65. Nodular density measuring 6.5 mm adjacent to the major fissure in the right lower lobe on image number 65 is likely a lymph node. Sub 4 mm right lower lobe pulmonary nodule on image number 69. 5 mm subpleural left lower lobe pulmonary nodule on image number 99. Upper Abdomen: The upper abdomen demonstrates diffuse and fairly marked fatty infiltration of the liver but no focal hepatic lesions.  Musculoskeletal: The chest wall is unremarkable. No supraclavicular or axillary adenopathy. IMPRESSION: 1. No mediastinal or hilar mass or overt adenopathy. Small scattered lymph nodes are noted. 2. 13 x 9 mm ground-glass nodule in the right upper lobe and several other smaller solid pulmonary nodules. Non-contrast chest CT at 3-6 months is recommended. If nodules persist, subsequent management will be based upon the most suspicious nodule(s). This recommendation follows the consensus statement: Guidelines for Management of Incidental Pulmonary Nodules Detected on CT Images: From the Fleischner Society 2017; Radiology 2017; 284:228-243. 3. Hiatal hernia and slightly dilated fluid-filled esophagus, likely due to reflux. 4. Age advanced coronary artery calcifications. 5. Diffuse and significant fatty infiltration of the liver. Aortic Atherosclerosis (ICD10-I70.0) and Emphysema (ICD10-J43.9). Electronically Signed   By: Marijo Sanes M.D.   On: 12/09/2017 12:49    ASSESSMENT AND PLAN:  Patient is a 62 year old white male with history of lung cancer and alcohol abuse presents to the ED for detox request  1. Acute on chronic alcoholism  Stable Fall this morning, tremulousness, restlessness, jitteriness Continue alcohol withdrawal protocol, fall precautions, check orthostatics, physical therapy to evaluate/treat, ambulate with assistance   2. 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 Management plans discussed with the patient, family and they are in agreement.  CODE STATUS: full  TOTAL TIME TAKING CARE OF THIS PATIENT: 35 minutes.     POSSIBLE D/C IN 1-2 DAYS, DEPENDING ON CLINICAL CONDITION.   Avel Peace Naima Veldhuizen M.D on 12/11/2017   Between 7am to 6pm - Pager - [phone]  After 6pm go to [email or web address] - password [email or web address] [facility]  Office  [phone]  CC: Primary care physician; Patient, [name] Per  Note: This dictation was prepared with Dragon dictation along with smaller phrase technology. Any transcriptional errors that result from this process are unintentional.

## 2017-12-12 ENCOUNTER — Other Ambulatory Visit: Payer: Self-pay | Admitting: *Deleted

## 2017-12-12 ENCOUNTER — Telehealth: Payer: Self-pay | Admitting: *Deleted

## 2017-12-12 MED ORDER — ADULT MULTIVITAMIN W/MINERALS CH: 1.0000 | ORAL_TABLET | Freq: Every day | ORAL | 0 refills | Status: DC

## 2017-12-12 MED ORDER — THIAMINE HCL 100 MG PO TABS: 100.0000 mg | ORAL_TABLET | Freq: Every day | ORAL | 0 refills | Status: DC

## 2017-12-12 MED ORDER — FOLIC ACID 1 MG PO TABS: 1.0000 mg | ORAL_TABLET | Freq: Every day | ORAL | 0 refills | Status: DC

## 2017-12-12 NOTE — Progress Notes (Addendum)
Clinical Social Worker (Chrisney) received a call from [place] with [facility] stating that patient can come on Wednesday 12/14/17 at 10 am for inpatient substance abuse treatment to [address] [place], [place] [address], Phone: [phone] or [phone]. CSW let Amy speak with patient directly via telephone and made him aware of above. Patient reported that he will go to [place] on Wednesday and is going back to [place] now. RN aware of above.   [employer], LCSW [phone]

## 2017-12-12 NOTE — Telephone Encounter (Signed)
Called pt to discuss appt scheduled for [name] on Friday 3/8. Pt did not answer. Message left to call back.

## 2017-12-12 NOTE — Progress Notes (Signed)
[facility] gave authorization for patient to go to [facility] for inpatient substance treatment. Authorization # [id number]. Amy in admissions at [facility] is aware of above.   [employer], LCSW [phone]

## 2017-12-12 NOTE — Progress Notes (Signed)
Clinical Social Worker (CSW) met with patient to discuss D/C plan. CSW explained to patient that he is stable for discharge today per MD. Patient reported that he has no where to go. On Friday 12/09/17 patient told this CSW that he lived in [place] with his friend [name], Jeannine Kitten wife and his wife [name]. Patient stated that he does not want to go back to [place] because his wife [name] does not want him to come back. Patient reported that he has no other friends or family to stay with. CSW offered [facility] and patient refused to go there. Patient reported that he would not stay in a shelter and does not understand why he can't stay at [facility]. CSW explained that [facility] is an acute hospital and not a long term treatment center. Patient asked CSW to send referral to [facility] for inpatient substance abuse treatment.  CSW sent referral to [facility] and [facility] for authorization. Patient reported that he would follow up on referral from home and and his friend Rogers Seeds from [place] will help him. Patient reported that he would go to [place] and said he has no transportation and no money for a taxi. CSW provided patient with a taxi voucher. RN aware of above. Please reconsult if future social work needs arise. CSW signing off.   [employer], LCSW [phone]

## 2017-12-12 NOTE — Care Management Note (Addendum)
Case Management Note  Patient Details  Name: Della Homan MRN: [id number] Date of Birth: December 12, 1955  Subjective/Objective:  Met with patient at bedside. Provided patient with information for the [facility] clinic. He states he has a walker at home. [facility] given [facility]. Email sent to both agencies.                   Action/Plan:   Expected Discharge Date:  12/12/17               Expected Discharge Plan:  Home/Self Care  In-House Referral:     Discharge planning Services  CM Consult  Post Acute Care Choice:    Choice offered to:     DME Arranged:    DME Agency:     HH Arranged:    HH Agency:     Status of Service:  Completed, signed off  If discussed at [employer] of Stay Meetings, dates discussed:    Additional Comments:  Jolly Mango, RN 12/12/2017, 1:59 PM

## 2017-12-12 NOTE — Plan of Care (Signed)
  Education: Knowledge of General Education information will improve 12/12/2017 0434 - Progressing by Tokiko Diefenderfer, Lucille Passy, RN   Health Behavior/Discharge Planning: Ability to manage health-related needs will improve 12/12/2017 0434 - Progressing by Christna Kulick, Lucille Passy, RN   Clinical Measurements: Ability to maintain clinical measurements within normal limits will improve 12/12/2017 0434 - Progressing by Anshul Meddings, Lucille Passy, RN Will remain free from infection 12/12/2017 0434 - Progressing by Bailey Faiella, Lucille Passy, RN Diagnostic test results will improve 12/12/2017 0434 - Progressing by Charlii Yost, Lucille Passy, RN Respiratory complications will improve 12/12/2017 0434 - Progressing by Bryna Colander, RN Cardiovascular complication will be avoided 12/12/2017 0434 - Progressing by Bryna Colander, RN   Activity: Risk for activity intolerance will decrease 12/12/2017 0434 - Progressing by Bryna Colander, RN   Nutrition: Adequate nutrition will be maintained 12/12/2017 0434 - Progressing by Bryna Colander, RN   Coping: Level of anxiety will decrease 12/12/2017 0434 - Progressing by Whyatt Klinger, Lucille Passy, RN
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
## 2017-12-13 ENCOUNTER — Encounter: Payer: Self-pay | Admitting: *Deleted

## 2017-12-14 NOTE — Progress Notes (Signed)
12/14/17: Clinical Social Worker (CSW) contacted [facility] to inquire if patient was able to admit to inpatient rehab. Per Amy admissions coordinator at [facility] patient did show up for his appointment today and was admitted to inpatient substance abuse rehab today.   [employer], LCSW [phone]

## 2017-12-16 ENCOUNTER — Encounter: Payer: Self-pay | Admitting: *Deleted

## 2017-12-16 ENCOUNTER — Encounter: Payer: Self-pay | Admitting: Oncology

## 2017-12-16 ENCOUNTER — Inpatient Hospital Stay: Payer: Self-pay | Attending: Oncology | Admitting: Oncology

## 2017-12-16 ENCOUNTER — Other Ambulatory Visit: Payer: Self-pay

## 2017-12-16 VITALS — BP 134/101 | HR 100 | Temp 98.4°F | Resp 18 | Ht 69.0 in | Wt 183.0 lb

## 2017-12-16 DIAGNOSIS — K760 Fatty (change of) liver, not elsewhere classified: Secondary | ICD-10-CM | POA: Insufficient documentation

## 2017-12-16 DIAGNOSIS — N529 Male erectile dysfunction, unspecified: Secondary | ICD-10-CM | POA: Insufficient documentation

## 2017-12-16 DIAGNOSIS — R599 Enlarged lymph nodes, unspecified: Secondary | ICD-10-CM | POA: Insufficient documentation

## 2017-12-16 DIAGNOSIS — I700 Atherosclerosis of aorta: Secondary | ICD-10-CM | POA: Insufficient documentation

## 2017-12-16 DIAGNOSIS — Z8589 Personal history of malignant neoplasm of other organs and systems: Secondary | ICD-10-CM | POA: Insufficient documentation

## 2017-12-16 DIAGNOSIS — K449 Diaphragmatic hernia without obstruction or gangrene: Secondary | ICD-10-CM | POA: Insufficient documentation

## 2017-12-16 DIAGNOSIS — Z923 Personal history of irradiation: Secondary | ICD-10-CM | POA: Insufficient documentation

## 2017-12-16 DIAGNOSIS — R911 Solitary pulmonary nodule: Secondary | ICD-10-CM

## 2017-12-16 DIAGNOSIS — E7800 Pure hypercholesterolemia, unspecified: Secondary | ICD-10-CM | POA: Insufficient documentation

## 2017-12-16 DIAGNOSIS — I10 Essential (primary) hypertension: Secondary | ICD-10-CM | POA: Insufficient documentation

## 2017-12-16 DIAGNOSIS — F1010 Alcohol abuse, uncomplicated: Secondary | ICD-10-CM | POA: Insufficient documentation

## 2017-12-16 DIAGNOSIS — Z809 Family history of malignant neoplasm, unspecified: Secondary | ICD-10-CM | POA: Insufficient documentation

## 2017-12-16 DIAGNOSIS — Z79899 Other long term (current) drug therapy: Secondary | ICD-10-CM | POA: Insufficient documentation

## 2017-12-16 DIAGNOSIS — E871 Hypo-osmolality and hyponatremia: Secondary | ICD-10-CM | POA: Insufficient documentation

## 2017-12-16 DIAGNOSIS — R918 Other nonspecific abnormal finding of lung field: Secondary | ICD-10-CM | POA: Insufficient documentation

## 2017-12-16 NOTE — Progress Notes (Signed)
  Oncology Nurse Navigator Documentation  Navigator Location: [facility]-Med Onc (12/16/17 1100)   )Navigator Encounter Type: Initial MedOnc (12/16/17 1100)   Abnormal Finding Date: 12/09/17 (12/16/17 1100)                     Barriers/Navigation Needs: Coordination of Care (12/16/17 1100)   Interventions: Coordination of Care (12/16/17 1100)   Coordination of Care: Appts;Radiology (12/16/17 1100)        Acuity: Level 1 (12/16/17 1100) Acuity Level 1: Initial guidance, education and coordination as needed;Minimal follow up required (12/16/17 1100)  met with patient during initial med-onc consultation with [name] to review CT scan and plan further follow up. All questions answered at the time of visit. Reviewed upcoming appts with patient. Pt given list of primary care clinics in the area and encouraged to call to establish care with PCP. Contact info given and instructed to call with any further needs or concerns. Pt verbalized understanding. Nothing further needed at this time.      Time Spent with Patient: 60 (12/16/17 1100)
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
## 2018-03-13 ENCOUNTER — Other Ambulatory Visit: Payer: Self-pay

## 2018-03-13 ENCOUNTER — Telehealth: Payer: Self-pay | Admitting: *Deleted

## 2018-03-13 ENCOUNTER — Ambulatory Visit: Admission: RE | Admit: 2018-03-13 | Payer: Self-pay | Source: Ambulatory Visit

## 2018-03-13 NOTE — Telephone Encounter (Signed)
No showed for PET this morning and they are not able to reach him to discuss rescheduling it

## 2018-03-16 ENCOUNTER — Telehealth: Payer: Self-pay | Admitting: *Deleted

## 2018-03-16 NOTE — Telephone Encounter (Signed)
Called pt and left message that pt missed ct scan on6/3. Not sure if pt did not know about it, forgot it or if something else happened but if he could call me I will be happy to reschedule it. I left my direct number.

## 2018-03-17 ENCOUNTER — Inpatient Hospital Stay: Payer: Self-pay | Admitting: Oncology

## 2018-04-05 ENCOUNTER — Encounter: Payer: Self-pay | Admitting: *Deleted

## 2018-04-07 ENCOUNTER — Telehealth: Payer: Self-pay | Admitting: *Deleted

## 2018-04-07 NOTE — Telephone Encounter (Signed)
Sent a letter to pt that we are unable to get in touch with him by phone and sending a message to let him know to call our office to get pet scan, labs and see md rescheduled.  If he does not call we will not be able to reschedule for the inability to contact him by telephone. The patient has a voicemail and I left message but never got call back

## 2019-03-31 ENCOUNTER — Inpatient Hospital Stay
Admission: EM | Admit: 2019-03-31 | Discharge: 2019-04-28 | DRG: 377 | Disposition: A | Discharge status: Home / Self Care | Payer: Self-pay | Attending: Internal Medicine | Admitting: Internal Medicine

## 2019-03-31 ENCOUNTER — Emergency Department: Payer: Self-pay

## 2019-03-31 ENCOUNTER — Other Ambulatory Visit: Payer: Self-pay

## 2019-03-31 ENCOUNTER — Encounter: Payer: Self-pay | Admitting: Emergency Medicine

## 2019-03-31 DIAGNOSIS — F10239 Alcohol dependence with withdrawal, unspecified: Secondary | ICD-10-CM | POA: Diagnosis present

## 2019-03-31 DIAGNOSIS — E512 Wernicke's encephalopathy: Secondary | ICD-10-CM | POA: Diagnosis present

## 2019-03-31 DIAGNOSIS — F10939 Alcohol use, unspecified with withdrawal, unspecified: Secondary | ICD-10-CM

## 2019-03-31 DIAGNOSIS — S80212A Abrasion, left knee, initial encounter: Secondary | ICD-10-CM | POA: Diagnosis present

## 2019-03-31 DIAGNOSIS — Z96653 Presence of artificial knee joint, bilateral: Secondary | ICD-10-CM | POA: Diagnosis present

## 2019-03-31 DIAGNOSIS — Z8711 Personal history of peptic ulcer disease: Secondary | ICD-10-CM

## 2019-03-31 DIAGNOSIS — Y95 Nosocomial condition: Secondary | ICD-10-CM | POA: Diagnosis present

## 2019-03-31 DIAGNOSIS — J690 Pneumonitis due to inhalation of food and vomit: Secondary | ICD-10-CM | POA: Diagnosis present

## 2019-03-31 DIAGNOSIS — Z79899 Other long term (current) drug therapy: Secondary | ICD-10-CM

## 2019-03-31 DIAGNOSIS — Z20828 Contact with and (suspected) exposure to other viral communicable diseases: Secondary | ICD-10-CM | POA: Diagnosis present

## 2019-03-31 DIAGNOSIS — Z923 Personal history of irradiation: Secondary | ICD-10-CM

## 2019-03-31 DIAGNOSIS — S80812A Abrasion, left lower leg, initial encounter: Secondary | ICD-10-CM | POA: Diagnosis present

## 2019-03-31 DIAGNOSIS — Z7141 Alcohol abuse counseling and surveillance of alcoholic: Secondary | ICD-10-CM

## 2019-03-31 DIAGNOSIS — K208 Other esophagitis: Secondary | ICD-10-CM | POA: Diagnosis present

## 2019-03-31 DIAGNOSIS — J189 Pneumonia, unspecified organism: Secondary | ICD-10-CM

## 2019-03-31 DIAGNOSIS — D62 Acute posthemorrhagic anemia: Secondary | ICD-10-CM | POA: Diagnosis present

## 2019-03-31 DIAGNOSIS — S80211A Abrasion, right knee, initial encounter: Secondary | ICD-10-CM | POA: Diagnosis present

## 2019-03-31 DIAGNOSIS — E8770 Fluid overload, unspecified: Secondary | ICD-10-CM | POA: Diagnosis present

## 2019-03-31 DIAGNOSIS — R509 Fever, unspecified: Secondary | ICD-10-CM

## 2019-03-31 DIAGNOSIS — E860 Dehydration: Secondary | ICD-10-CM | POA: Diagnosis present

## 2019-03-31 DIAGNOSIS — Z4659 Encounter for fitting and adjustment of other gastrointestinal appliance and device: Secondary | ICD-10-CM

## 2019-03-31 DIAGNOSIS — R7881 Bacteremia: Secondary | ICD-10-CM | POA: Diagnosis present

## 2019-03-31 DIAGNOSIS — Z85118 Personal history of other malignant neoplasm of bronchus and lung: Secondary | ICD-10-CM

## 2019-03-31 DIAGNOSIS — Z8249 Family history of ischemic heart disease and other diseases of the circulatory system: Secondary | ICD-10-CM

## 2019-03-31 DIAGNOSIS — E7800 Pure hypercholesterolemia, unspecified: Secondary | ICD-10-CM | POA: Diagnosis present

## 2019-03-31 DIAGNOSIS — R1310 Dysphagia, unspecified: Secondary | ICD-10-CM | POA: Diagnosis present

## 2019-03-31 DIAGNOSIS — E871 Hypo-osmolality and hyponatremia: Secondary | ICD-10-CM | POA: Diagnosis present

## 2019-03-31 DIAGNOSIS — N179 Acute kidney failure, unspecified: Secondary | ICD-10-CM | POA: Diagnosis present

## 2019-03-31 DIAGNOSIS — S80811A Abrasion, right lower leg, initial encounter: Secondary | ICD-10-CM | POA: Diagnosis present

## 2019-03-31 DIAGNOSIS — Z85818 Personal history of malignant neoplasm of other sites of lip, oral cavity, and pharynx: Secondary | ICD-10-CM

## 2019-03-31 DIAGNOSIS — E878 Other disorders of electrolyte and fluid balance, not elsewhere classified: Secondary | ICD-10-CM | POA: Diagnosis present

## 2019-03-31 DIAGNOSIS — I10 Essential (primary) hypertension: Secondary | ICD-10-CM | POA: Diagnosis present

## 2019-03-31 DIAGNOSIS — J9690 Respiratory failure, unspecified, unspecified whether with hypoxia or hypercapnia: Secondary | ICD-10-CM

## 2019-03-31 DIAGNOSIS — E872 Acidosis: Secondary | ICD-10-CM | POA: Diagnosis present

## 2019-03-31 DIAGNOSIS — E876 Hypokalemia: Secondary | ICD-10-CM | POA: Diagnosis present

## 2019-03-31 DIAGNOSIS — K922 Gastrointestinal hemorrhage, unspecified: Principal | ICD-10-CM | POA: Diagnosis present

## 2019-03-31 DIAGNOSIS — W19XXXA Unspecified fall, initial encounter: Secondary | ICD-10-CM | POA: Diagnosis present

## 2019-03-31 DIAGNOSIS — K920 Hematemesis: Secondary | ICD-10-CM | POA: Diagnosis present

## 2019-03-31 DIAGNOSIS — J9601 Acute respiratory failure with hypoxia: Secondary | ICD-10-CM | POA: Diagnosis present

## 2019-03-31 DIAGNOSIS — G312 Degeneration of nervous system due to alcohol: Secondary | ICD-10-CM | POA: Diagnosis present

## 2019-03-31 DIAGNOSIS — T796XXA Traumatic ischemia of muscle, initial encounter: Secondary | ICD-10-CM | POA: Diagnosis present

## 2019-03-31 DIAGNOSIS — B957 Other staphylococcus as the cause of diseases classified elsewhere: Secondary | ICD-10-CM | POA: Diagnosis present

## 2019-03-31 DIAGNOSIS — R0600 Dyspnea, unspecified: Secondary | ICD-10-CM

## 2019-03-31 LAB — URINALYSIS, COMPLETE (UACMP) WITH MICROSCOPIC
Bilirubin Urine: NEGATIVE
Glucose, UA: NEGATIVE mg/dL
Ketones, ur: 5 mg/dL — AB
Leukocytes,Ua: NEGATIVE
Nitrite: NEGATIVE
Protein, ur: 30 mg/dL — AB
Specific Gravity, Urine: 1.013 (ref 1.005–1.030)
pH: 5 (ref 5.0–8.0)

## 2019-03-31 LAB — CBC WITH DIFFERENTIAL/PLATELET
Abs Immature Granulocytes: 0 10*3/uL (ref 0.00–0.07)
Basophils Absolute: 0 10*3/uL (ref 0.0–0.1)
Basophils Relative: 0 %
Eosinophils Absolute: 0 10*3/uL (ref 0.0–0.5)
Eosinophils Relative: 0 %
HCT: 25.7 % — ABNORMAL LOW (ref 39.0–52.0)
Hemoglobin: 6.3 g/dL — ABNORMAL LOW (ref 13.0–17.0)
Lymphocytes Relative: 1 %
Lymphs Abs: 0.2 10*3/uL — ABNORMAL LOW (ref 0.7–4.0)
MCH: 19.7 pg — ABNORMAL LOW (ref 26.0–34.0)
MCHC: 24.5 g/dL — ABNORMAL LOW (ref 30.0–36.0)
MCV: 80.6 fL (ref 80.0–100.0)
Monocytes Absolute: 0.7 10*3/uL (ref 0.1–1.0)
Monocytes Relative: 3 %
Neutro Abs: 22.1 10*3/uL — ABNORMAL HIGH (ref 1.7–7.7)
Neutrophils Relative %: 96 %
Platelets: 386 10*3/uL (ref 150–400)
RBC: 3.19 MIL/uL — ABNORMAL LOW (ref 4.22–5.81)
RDW: 25.6 % — ABNORMAL HIGH (ref 11.5–15.5)
Smear Review: NORMAL
WBC: 23 10*3/uL — ABNORMAL HIGH (ref 4.0–10.5)
nRBC: 1.2 % — ABNORMAL HIGH (ref 0.0–0.2)
nRBC: 5 /100 WBC — ABNORMAL HIGH

## 2019-03-31 LAB — COMPREHENSIVE METABOLIC PANEL
ALT: 98 U/L — ABNORMAL HIGH (ref 0–44)
AST: 213 U/L — ABNORMAL HIGH (ref 15–41)
Albumin: 2.9 g/dL — ABNORMAL LOW (ref 3.5–5.0)
Alkaline Phosphatase: 280 U/L — ABNORMAL HIGH (ref 38–126)
Anion gap: 38 — ABNORMAL HIGH (ref 5–15)
BUN: 17 mg/dL (ref 8–23)
CO2: 12 mmol/L — ABNORMAL LOW (ref 22–32)
Calcium: 13.4 mg/dL (ref 8.9–10.3)
Chloride: 78 mmol/L — ABNORMAL LOW (ref 98–111)
Creatinine, Ser: 1.54 mg/dL — ABNORMAL HIGH (ref 0.61–1.24)
GFR calc Af Amer: 55 mL/min — ABNORMAL LOW (ref >60)
GFR calc non Af Amer: 48 mL/min — ABNORMAL LOW (ref >60)
Glucose, Bld: 82 mg/dL (ref 70–99)
Potassium: 3.2 mmol/L — ABNORMAL LOW (ref 3.5–5.1)
Sodium: 128 mmol/L — ABNORMAL LOW (ref 135–145)
Total Bilirubin: 1.9 mg/dL — ABNORMAL HIGH (ref 0.3–1.2)
Total Protein: 6.3 g/dL — ABNORMAL LOW (ref 6.5–8.1)

## 2019-03-31 LAB — BLOOD GAS, ARTERIAL
Acid-base deficit: 6.7 mmol/L — ABNORMAL HIGH (ref 0.0–2.0)
Bicarbonate: 17.3 mmol/L — ABNORMAL LOW (ref 20.0–28.0)
FIO2: 0.4
O2 Saturation: 94.2 %
Patient temperature: 37
pCO2 arterial: 30 mmHg — ABNORMAL LOW (ref 32.0–48.0)
pH, Arterial: 7.37 (ref 7.350–7.450)
pO2, Arterial: 74 mmHg — ABNORMAL LOW (ref 83.0–108.0)

## 2019-03-31 LAB — LACTIC ACID, PLASMA: Lactic Acid, Venous: >11 mmol/L (ref 0.5–1.9)

## 2019-03-31 LAB — SARS CORONAVIRUS 2 BY RT PCR (HOSPITAL ORDER, PERFORMED IN ~~LOC~~ HOSPITAL LAB): SARS Coronavirus 2: NEGATIVE

## 2019-03-31 LAB — ABO/RH: ABO/RH(D): B POS

## 2019-03-31 LAB — TROPONIN I: Troponin I: 0.04 ng/mL (ref <0.03)

## 2019-03-31 LAB — CK: Total CK: 1674 U/L — ABNORMAL HIGH (ref 49–397)

## 2019-03-31 LAB — ETHANOL: Alcohol, Ethyl (B): <10 mg/dL (ref <10)

## 2019-03-31 LAB — MAGNESIUM: Magnesium: 2.2 mg/dL (ref 1.7–2.4)

## 2019-03-31 LAB — PREPARE RBC (CROSSMATCH)

## 2019-03-31 MED ORDER — PANTOPRAZOLE SODIUM 40 MG IV SOLR: 40.0000 mg | Freq: Two times a day (BID) | INTRAVENOUS | Status: DC

## 2019-03-31 MED ORDER — IPRATROPIUM-ALBUTEROL 0.5-2.5 (3) MG/3ML IN SOLN
3.0000 mL | Freq: Four times a day (QID) | RESPIRATORY_TRACT | Status: DC | PRN
  Administered 2019-04-04 (×2): 3 mL via RESPIRATORY_TRACT
  Filled 2019-03-31: qty 3

## 2019-03-31 MED ORDER — POTASSIUM CHLORIDE 10 MEQ/100ML IV SOLN
10.0000 meq | INTRAVENOUS | Status: AC
  Administered 2019-04-01 (×2): 10 meq via INTRAVENOUS
  Filled 2019-03-31 (×2): qty 100

## 2019-03-31 MED ORDER — SODIUM CHLORIDE 0.9 % IV BOLUS: 1000.0000 mL | Freq: Once | INTRAVENOUS | Status: DC

## 2019-03-31 MED ORDER — ONDANSETRON HCL 4 MG/2ML IJ SOLN
4.0000 mg | Freq: Four times a day (QID) | INTRAMUSCULAR | Status: DC | PRN
  Administered 2019-04-04 – 2019-04-21 (×3): 4 mg via INTRAVENOUS
  Filled 2019-03-31 (×3): qty 2

## 2019-03-31 MED ORDER — SODIUM CHLORIDE 0.9 % IV SOLN
1.0000 g | Freq: Once | INTRAVENOUS | Status: AC
  Administered 2019-03-31: 1 g via INTRAVENOUS
  Filled 2019-03-31: qty 10

## 2019-03-31 MED ORDER — SODIUM CHLORIDE 0.9 % IV BOLUS: 500.0000 mL | Freq: Once | INTRAVENOUS | Status: DC

## 2019-03-31 MED ORDER — SODIUM CHLORIDE 0.9 % IV SOLN: 8.0000 mg/h | INTRAVENOUS | Status: DC

## 2019-03-31 MED ORDER — SODIUM CHLORIDE 0.9 % IV SOLN
8.0000 mg/h | INTRAVENOUS | Status: DC
  Administered 2019-03-31 – 2019-04-02 (×3): 8 mg/h via INTRAVENOUS
  Filled 2019-03-31 (×4): qty 80

## 2019-03-31 MED ORDER — THIAMINE HCL 100 MG/ML IJ SOLN
100.0000 mg | Freq: Every day | INTRAMUSCULAR | Status: DC
  Administered 2019-04-01: 100 mg via INTRAVENOUS
  Filled 2019-03-31: qty 2

## 2019-03-31 MED ORDER — SODIUM CHLORIDE 0.9 % IV BOLUS
500.0000 mL | Freq: Once | INTRAVENOUS | Status: AC
  Administered 2019-03-31: 500 mL via INTRAVENOUS

## 2019-03-31 MED ORDER — VANCOMYCIN HCL 10 G IV SOLR
2000.0000 mg | Freq: Once | INTRAVENOUS | Status: DC
  Filled 2019-03-31: qty 2000

## 2019-03-31 MED ORDER — ONDANSETRON HCL 4 MG PO TABS: 4.0000 mg | ORAL_TABLET | Freq: Four times a day (QID) | ORAL | Status: DC | PRN

## 2019-03-31 MED ORDER — ONDANSETRON HCL 4 MG/2ML IJ SOLN
4.0000 mg | Freq: Once | INTRAMUSCULAR | Status: AC
Start: 2019-03-31 — End: 2019-03-31
  Administered 2019-03-31: 4 mg via INTRAVENOUS
  Filled 2019-03-31: qty 2

## 2019-03-31 MED ORDER — LORAZEPAM 2 MG/ML IJ SOLN: 1.0000 mg | INTRAMUSCULAR | Status: DC | PRN

## 2019-03-31 MED ORDER — LORAZEPAM 2 MG/ML IJ SOLN: 1.0000 mg | Freq: Once | INTRAMUSCULAR | Status: DC

## 2019-03-31 MED ORDER — VANCOMYCIN HCL IN DEXTROSE 1-5 GM/200ML-% IV SOLN: 1000.0000 mg | Freq: Once | INTRAVENOUS | Status: DC

## 2019-03-31 MED ORDER — SODIUM CHLORIDE 0.9 % IV SOLN
80.0000 mg | Freq: Once | INTRAVENOUS | Status: AC
  Administered 2019-03-31: 80 mg via INTRAVENOUS
  Filled 2019-03-31: qty 80

## 2019-03-31 MED ORDER — SODIUM CHLORIDE 0.9% IV SOLUTION
Freq: Once | INTRAVENOUS | Status: AC
  Administered 2019-03-31: 22:00:00 via INTRAVENOUS
  Filled 2019-03-31: qty 250

## 2019-03-31 MED ORDER — SODIUM CHLORIDE 0.9 % IV SOLN
2.0000 g | Freq: Two times a day (BID) | INTRAVENOUS | Status: DC
  Filled 2019-03-31: qty 2

## 2019-03-31 MED ORDER — SODIUM CHLORIDE 0.9% IV SOLUTION
Freq: Once | INTRAVENOUS | Status: DC
  Filled 2019-03-31: qty 250

## 2019-03-31 MED ORDER — SODIUM CHLORIDE 0.9 % IV SOLN
INTRAVENOUS | Status: DC
  Administered 2019-04-01: 04:00:00 via INTRAVENOUS

## 2019-03-31 MED ORDER — SODIUM CHLORIDE 0.9 % IV SOLN
50.0000 ug/h | INTRAVENOUS | Status: DC
  Administered 2019-04-01 – 2019-04-02 (×3): 50 ug/h via INTRAVENOUS
  Filled 2019-03-31 (×7): qty 1

## 2019-03-31 MED ORDER — FOLIC ACID 5 MG/ML IJ SOLN
1.0000 mg | Freq: Every day | INTRAMUSCULAR | Status: DC
  Administered 2019-04-01 – 2019-04-02 (×2): 1 mg via INTRAVENOUS
  Filled 2019-03-31 (×3): qty 0.2

## 2019-03-31 MED ORDER — SODIUM CHLORIDE 0.9 % IV SOLN
80.0000 mg | Freq: Once | INTRAVENOUS | Status: DC
  Filled 2019-03-31: qty 80

## 2019-03-31 MED ORDER — SODIUM CHLORIDE 0.9% FLUSH
3.0000 mL | Freq: Two times a day (BID) | INTRAVENOUS | Status: DC
  Administered 2019-04-01 – 2019-04-28 (×53): 3 mL via INTRAVENOUS

## 2019-03-31 MED ORDER — LORAZEPAM 2 MG/ML IJ SOLN
1.0000 mg | Freq: Once | INTRAMUSCULAR | Status: AC
Start: 2019-03-31 — End: 2019-03-31
  Administered 2019-03-31: 1 mg via INTRAVENOUS
  Filled 2019-03-31: qty 1
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
## 2019-03-31 NOTE — ED Notes (Signed)
Pt seems more congested since his arrival. Coughing and then spits out rust colored sputum. States the diarrhea he has been having intermittently for over a month has been dark.

## 2019-03-31 NOTE — ED Notes (Signed)
Pt seems more relaxed,
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
## 2019-03-31 NOTE — ED Provider Notes (Addendum)
[facility] Emergency Department Provider Note  ____________________________________________   I have reviewed the triage vital signs and the nursing notes. Where available I have reviewed prior notes and, if possible and indicated, outside hospital notes.    HISTORY  Chief Complaint Weakness and Diarrhea    HPI George Wade is a 63 y.o. male in by EMS, and at time of low staffing as of the coronavirus, patient has a history of daily alcohol drinking and alcohol withdrawal, states he been having diarrhea for the last month and dark vomit since yesterday.  Diarrhea sometimes looks dark.  No fevers no chills.  He states he fell yesterday and fell today last alcohol was yesterday.  He was going to the grocery store but did not make it.  He was outside in cold weather for couple hours before EMS came.  He states he did not pass out.  He did try to crawl and has some scuff marks on his knees.  Denies any neck pain or acute injury from his fall.  She is a little cold but not shaky.  No headache, no stiff neck, no abdominal pain no chest pain no cough no fever   Past Medical History:  Diagnosis Date  . ED (erectile dysfunction)   . Hypercholesteremia   . Hypertension   . Lung cancer [facility])     Patient Active Problem List   Diagnosis Date Noted  . Alcohol withdrawal ([place]) 12/09/2017    Past Surgical History:  Procedure Laterality Date  . HERNIA REPAIR  1958  . REPLACEMENT TOTAL KNEE BILATERAL  2009  . TONSILLECTOMY  2008    Prior to Admission medications   Medication Sig Start Date End Date Taking? Authorizing Provider  amLODipine (NORVASC) 10 MG tablet Take 1 tablet (10 mg total) by mouth daily. 12/09/17   Harvest Dark, MD  atorvastatin (LIPITOR) 40 MG tablet Take 1 tablet (40 mg total) by mouth daily. 12/09/17   Harvest Dark, MD  benazepril (LOTENSIN) 40 MG tablet Take 1 tablet (40 mg total) by mouth daily. 12/09/17   Harvest Dark, MD  folic  acid (FOLVITE) 1 MG tablet Take 1 tablet (1 mg total) by mouth daily. 12/12/17   Salary, Avel Peace, MD  Multiple Vitamin (MULTIVITAMIN WITH MINERALS) TABS tablet Take 1 tablet by mouth daily. 12/12/17   Salary, Avel Peace, MD  omeprazole (PRILOSEC) 40 MG capsule Take 40 mg by mouth daily.    [provider]  tadalafil (CIALIS) 5 MG tablet Take 1 tablet (5 mg total) by mouth daily as needed for erectile dysfunction. 10/08/17   Norval Gable, MD  thiamine 100 MG tablet Take 1 tablet (100 mg total) by mouth daily. 12/12/17   Salary, Avel Peace, MD    Allergies Patient has no known allergies.  Family History  Problem Relation Age of Onset  . Cancer Mother   . Hypertension Father   . Cancer Paternal Grandmother     Social History Social History   Tobacco Use  . Smoking status: Never Smoker  . Smokeless tobacco: Never Used  Substance Use Topics  . Alcohol use: No    Frequency: Never    Comment: Quit 2 days ago  . Drug use: No    Review of Systems Constitutional: No fever/chills Eyes: No visual changes. ENT: No sore throat. No stiff neck no neck pain Cardiovascular: Denies chest pain. Respiratory: Denies shortness of breath. Gastrointestinal:   See HPI  genitourinary: Negative for dysuria. Musculoskeletal: Negative lower extremity  swelling Skin: Negative for rash. Neurological: Negative for severe headaches, focal weakness or numbness.   ____________________________________________   PHYSICAL EXAM:  VITAL SIGNS: ED Triage Vitals  Enc Vitals Group     BP 03/31/19 1910 121/77     Pulse Rate 03/31/19 1910 (!) 119     Resp 03/31/19 1910 18     Temp 03/31/19 1910 98 F (36.7 C)     Temp Source 03/31/19 1910 Oral     SpO2 03/31/19 1910 95 %     Weight 03/31/19 1912 172 lb (78 kg)     Height 03/31/19 1912 5\' 9"  (1.753 m)     Head Circumference --      Peak Flow --      Pain Score 03/31/19 1911 3     Pain Loc --      Pain Edu? --      Excl. in [place]? --      Constitutional: Alert and oriented.  Poorly kempt and dirty initially dominant we did clean him up.  Shaky, pale but in no acute medical distress Eyes: Conjunctivae are pale Head: Atraumatic HEENT: No congestion/rhinnorhea. Mucous membranes are moist.  Oropharynx non-erythematous Neck:   Nontender with no meningismus, no masses, no stridor Cardiovascular: Tachycardia noted regular rhythm. Grossly normal heart sounds.  Good peripheral circulation. Respiratory: Normal respiratory effort.  No retractions. Lungs CTAB. Abdominal: Soft and nontender. No distention. No guarding no rebound Back:  There is no focal tenderness or step off.  there is no midline tenderness there are no lesions noted. there is no CVA tenderness Musculoskeletal: Lateral abrasions to the knee, but full painless range of motion of both knees.  Slight discomfort of the left knee when I palpate it.  No upper extremity tenderness. No joint effusions, no DVT signs strong distal pulses no edema Neurologic:  Normal speech and language. No gross focal neurologic deficits are appreciated.  Skin:  Skin is warm, dry and mild abrasions noted. No rash noted. Psychiatric: Mood and affect are anxious. Speech and behavior are normal.  ____________________________________________   LABS (all labs ordered are listed, but only abnormal results are displayed)  Labs Reviewed  CBC WITH DIFFERENTIAL/PLATELET - Abnormal; Notable for the following components:      Result Value   WBC 23.0 (*)    RBC 3.19 (*)    Hemoglobin 6.3 (*)    HCT 25.7 (*)    MCH 19.7 (*)    MCHC 24.5 (*)    RDW 25.6 (*)    nRBC 1.2 (*)    All other components within normal limits  SARS CORONAVIRUS 2 (HOSPITAL ORDER, [place] LAB)  CULTURE, BLOOD (ROUTINE X 2)  CULTURE, BLOOD (ROUTINE X 2)  URINE CULTURE  ETHANOL  COMPREHENSIVE METABOLIC PANEL  TROPONIN I  CK  URINALYSIS, COMPLETE (UACMP) WITH MICROSCOPIC  LACTIC ACID, PLASMA   LACTIC ACID, PLASMA  CBG MONITORING, ED  TYPE AND SCREEN    Pertinent labs  results that were available during my care of the patient were reviewed by me and considered in my medical decision making (see chart for details). ____________________________________________  EKG  I personally interpreted any EKGs ordered by me or triage Sinus tach rate 119, normal axis no acute ischemic changes ____________________________________________  RADIOLOGY  Pertinent labs & imaging results that were available during my care of the patient were reviewed by me and considered in my medical decision making (see chart for details). If possible, patient and/or family made  aware of any abnormal findings.  Ct Head Wo Contrast  Result Date: 03/31/2019 CLINICAL DATA:  Head trauma EXAM: CT HEAD WITHOUT CONTRAST TECHNIQUE: Contiguous axial images were obtained from the base of the skull through the vertex without intravenous contrast. COMPARISON:  None. FINDINGS: Brain: No acute territorial infarction, hemorrhage, or intracranial mass. Atrophy and mild small vessel ischemic changes of the white matter. Slight ventricular prominence felt secondary to atrophy. Vascular: No hyperdense vessels. Scattered calcifications at the carotid siphon Skull: Normal. Negative for fracture or focal lesion. Sinuses/Orbits: No acute finding. Mucosal thickening in the right maxillary sinus. Other: None IMPRESSION: 1. No CT evidence for acute intracranial abnormality. 2. Atrophy and mild small vessel ischemic changes of the white matter Electronically Signed   By: Donavan Foil M.D.   On: 03/31/2019 20:23   ____________________________________________    PROCEDURES  Procedure(s) performed: None  Procedures  Critical Care performed: CRITICAL CARE Performed by: Schuyler Amor   Total critical care time: 55 minutes  Critical care time was exclusive of separately billable procedures and treating other patients.  Critical  care was necessary to treat or prevent imminent or life-threatening deterioration.  Critical care was time spent personally by me on the following activities: development of treatment plan with patient and/or surrogate as well as nursing, discussions with consultants, evaluation of patient's response to treatment, examination of patient, obtaining history from patient or surrogate, ordering and performing treatments and interventions, ordering and review of laboratory studies, ordering and review of radiographic studies, pulse oximetry and re-evaluation of patient's condition.   ____________________________________________   INITIAL IMPRESSION / ASSESSMENT AND PLAN / ED COURSE  Pertinent labs & imaging results that were available during my care of the patient were reviewed by me and considered in my medical decision making (see chart for details).  Patient here after a non-syncopal fall, history of significant EtOH abuse, multiple medical problems identified, appeared to be in early withdrawal I did give him Ativan which helped the shaking, his heart rate is elevated likely from multifactorial reasons.  He had one episode of dark emesis here which appeared to have some blood in it.  Hemoglobin is 6.5 we are sending type and screen, he consents to transfusion we will transfuse him him starting on Protonix and Protonix drip.  Given possibility of varices I will give him Rocephin.  We are giving him IV fluid, blood pressures are holding.  We are getting x-ray of the chest and knee.  White count is nonspecific.  He will be admitted to the hospitalist service for multiple different pathologies.  ----------------------------------------- 9:46 PM on 03/31/2019 -----------------------------------------  dw dr Allen Norris who agrees w mgt follow.  No indication for emergent GI procedure at this time given very significant comorbidities.  Discussed again with Levada Dy, nurse practitioner and hospitalist service,  they are aware of the critical findings and are coming to see the patient.  He is still awake and mentating well.    ____________________________________________   FINAL CLINICAL IMPRESSION(S) / ED DIAGNOSES  Final diagnoses:  Fall      This chart was dictated using voice recognition software.  Despite best efforts to proofread,  errors can occur which can change meaning.      Schuyler Amor, MD 03/31/19 2038    Schuyler Amor, MD 03/31/19 2146    Schuyler Amor, MD 03/31/19 2159

## 2019-03-31 NOTE — ED Notes (Signed)
Pt gave consent for blood transfusion - attempted to sign but physically unable to sign. Called in [place] to witness verbal consent

## 2019-04-01 ENCOUNTER — Inpatient Hospital Stay: Payer: Self-pay

## 2019-04-01 DIAGNOSIS — F10239 Alcohol dependence with withdrawal, unspecified: Secondary | ICD-10-CM

## 2019-04-01 DIAGNOSIS — K922 Gastrointestinal hemorrhage, unspecified: Principal | ICD-10-CM

## 2019-04-01 LAB — BASIC METABOLIC PANEL
Anion gap: 14 (ref 5–15)
BUN: 19 mg/dL (ref 8–23)
CO2: 27 mmol/L (ref 22–32)
Calcium: 10.5 mg/dL — ABNORMAL HIGH (ref 8.9–10.3)
Chloride: 87 mmol/L — ABNORMAL LOW (ref 98–111)
Creatinine, Ser: 1.02 mg/dL (ref 0.61–1.24)
GFR calc Af Amer: >60 mL/min (ref >60)
GFR calc non Af Amer: >60 mL/min (ref >60)
Glucose, Bld: 120 mg/dL — ABNORMAL HIGH (ref 70–99)
Potassium: 3.5 mmol/L (ref 3.5–5.1)
Sodium: 128 mmol/L — ABNORMAL LOW (ref 135–145)

## 2019-04-01 LAB — BLOOD CULTURE ID PANEL (REFLEXED)

## 2019-04-01 LAB — CORTISOL-AM, BLOOD: Cortisol - AM: 54 ug/dL — ABNORMAL HIGH (ref 6.7–22.6)

## 2019-04-01 LAB — CBC
HCT: 25.1 % — ABNORMAL LOW (ref 39.0–52.0)
Hemoglobin: 6.9 g/dL — ABNORMAL LOW (ref 13.0–17.0)
MCH: 21.4 pg — ABNORMAL LOW (ref 26.0–34.0)
MCHC: 27.5 g/dL — ABNORMAL LOW (ref 30.0–36.0)
MCV: 78 fL — ABNORMAL LOW (ref 80.0–100.0)
Platelets: 206 10*3/uL (ref 150–400)
RBC: 3.22 MIL/uL — ABNORMAL LOW (ref 4.22–5.81)
RDW: 26.4 % — ABNORMAL HIGH (ref 11.5–15.5)
WBC: 15.4 10*3/uL — ABNORMAL HIGH (ref 4.0–10.5)
nRBC: 0.7 % — ABNORMAL HIGH (ref 0.0–0.2)

## 2019-04-01 LAB — MRSA PCR SCREENING: MRSA by PCR: NEGATIVE

## 2019-04-01 LAB — HEMOGLOBIN AND HEMATOCRIT, BLOOD
HCT: 26.3 % — ABNORMAL LOW (ref 39.0–52.0)
HCT: 25.2 % — ABNORMAL LOW (ref 39.0–52.0)
Hemoglobin: 7.6 g/dL — ABNORMAL LOW (ref 13.0–17.0)
Hemoglobin: 7.2 g/dL — ABNORMAL LOW (ref 13.0–17.0)

## 2019-04-01 LAB — PROCALCITONIN: Procalcitonin: 2.77 ng/mL

## 2019-04-01 LAB — GLUCOSE, CAPILLARY
Glucose-Capillary: 107 mg/dL — ABNORMAL HIGH (ref 70–99)
Glucose-Capillary: 108 mg/dL — ABNORMAL HIGH (ref 70–99)

## 2019-04-01 LAB — PROTIME-INR
INR: 1.2 (ref 0.8–1.2)
Prothrombin Time: 15.2 seconds (ref 11.4–15.2)

## 2019-04-01 LAB — LACTIC ACID, PLASMA: Lactic Acid, Venous: 5.2 mmol/L (ref 0.5–1.9)

## 2019-04-01 LAB — CK: Total CK: 1017 U/L — ABNORMAL HIGH (ref 49–397)

## 2019-04-01 MED ORDER — DEXMEDETOMIDINE HCL IN NACL 400 MCG/100ML IV SOLN
0.4000 ug/kg/h | INTRAVENOUS | Status: DC
  Administered 2019-04-01: 0.8 ug/kg/h via INTRAVENOUS
  Administered 2019-04-01: 23:00:00 1 ug/kg/h via INTRAVENOUS
  Administered 2019-04-01 – 2019-04-02 (×2): 0.4 ug/kg/h via INTRAVENOUS
  Administered 2019-04-02: 0.8 ug/kg/h via INTRAVENOUS
  Administered 2019-04-03: 0.4 ug/kg/h via INTRAVENOUS
  Filled 2019-04-01 (×6): qty 100

## 2019-04-01 MED ORDER — THIAMINE HCL 100 MG/ML IJ SOLN
500.0000 mg | Freq: Every day | INTRAVENOUS | Status: DC
  Administered 2019-04-02: 500 mg via INTRAVENOUS
  Filled 2019-04-01 (×2): qty 5

## 2019-04-01 MED ORDER — THIAMINE HCL 100 MG/ML IJ SOLN
Freq: Once | INTRAVENOUS | Status: AC
  Administered 2019-04-01: 07:00:00 via INTRAVENOUS
  Filled 2019-04-01: qty 1000

## 2019-04-01 MED ORDER — SODIUM CHLORIDE 0.9 % IV SOLN
3.0000 g | Freq: Three times a day (TID) | INTRAVENOUS | Status: DC
  Administered 2019-04-01 (×2): 3 g via INTRAVENOUS
  Filled 2019-04-01 (×4): qty 3

## 2019-04-01 MED ORDER — LORAZEPAM 2 MG/ML IJ SOLN
1.0000 mg | INTRAMUSCULAR | Status: DC | PRN
  Administered 2019-04-02: 2 mg via INTRAVENOUS
  Filled 2019-04-01: qty 1

## 2019-04-01 MED ORDER — ORAL CARE MOUTH RINSE
15.0000 mL | Freq: Two times a day (BID) | OROMUCOSAL | Status: DC
  Administered 2019-04-01 – 2019-04-28 (×48): 15 mL via OROMUCOSAL

## 2019-04-01 MED ORDER — SODIUM CHLORIDE 0.9 % IV SOLN
3.0000 g | Freq: Four times a day (QID) | INTRAVENOUS | Status: DC
  Administered 2019-04-01 – 2019-04-06 (×19): 3 g via INTRAVENOUS
  Filled 2019-04-01 (×27): qty 3

## 2019-04-01 MED ORDER — CHLORHEXIDINE GLUCONATE CLOTH 2 % EX PADS
6.0000 | MEDICATED_PAD | Freq: Every day | CUTANEOUS | Status: DC
  Administered 2019-04-01 – 2019-04-03 (×3): 6 via TOPICAL

## 2019-04-01 MED ORDER — POTASSIUM CHLORIDE 10 MEQ/100ML IV SOLN
10.0000 meq | INTRAVENOUS | Status: AC
  Administered 2019-04-01 (×4): 10 meq via INTRAVENOUS
  Filled 2019-04-01 (×4): qty 100

## 2019-04-01 MED ORDER — LORAZEPAM 2 MG/ML IJ SOLN: 1.0000 mg | INTRAMUSCULAR | Status: DC | PRN

## 2019-04-01 MED ORDER — FOLIC ACID 5 MG/ML IJ SOLN: 1.0000 mg | Freq: Every day | INTRAMUSCULAR | Status: DC

## 2019-04-01 MED ORDER — THIAMINE HCL 100 MG/ML IJ SOLN: 100.0000 mg | Freq: Every day | INTRAMUSCULAR | Status: DC

## 2019-04-01 NOTE — Consult Note (Signed)
PULMONARY / CRITICAL CARE MEDICINE  Name: George Wade MRN: [id number] DOB: January 09, 1956    LOS: 1  Referring Provider: Dr. Sidney Ace Reason for Referral: Acute GI bleed and aspiration pneumonia  HPI: 63 year old male with a history of previous GI bleed, lung cancer, erectile dysfunction, hypertension, hyperlipidemia and alcohol abuse was brought to the ED by EMS after being found on the ground outside by his neighbors.  Patient is confused but is able to provide some history.  Patient states that he had 2 falls recently, one inside his home and another one yesterday outside.  He states that he wanted to go to the store when he fell and could not get up.  Patient was down for approximately 4 hours prior to EMS arrival.  When EMS arrived, patient had blood clots in his mouth.  He reported drinking at least 1 bottle of wine daily.  He also states that he has had some diarrhea 2 days prior to the fall and also had episodes of vomiting.  Describes emesis as dark-colored. At the ED, his sodium was 128, potassium 3.2, CO2 12, creatinine 1.54 up from his baseline of 0.61, AST 213, ALT 98, CK 1674, lactic acid greater than 11, troponin of 0.04, WBC of 23,000, hemoglobin of 6.3, hematocrit of 25.7, and a platelet count of 153.  He was started on a blood transfusion, Protonix infusion, octreotide infusion and broad-spectrum antibiotics and admitted to the ICU for further management.  Past Medical History:  Diagnosis Date  . ED (erectile dysfunction)   . Hypercholesteremia   . Hypertension   . Lung cancer [facility])    Past Surgical History:  Procedure Laterality Date  . HERNIA REPAIR  1958  . REPLACEMENT TOTAL KNEE BILATERAL  2009  . TONSILLECTOMY  2008   No current facility-administered medications on file prior to encounter.    Current Outpatient Medications on File Prior to Encounter  Medication Sig  . amLODipine (NORVASC) 10 MG tablet Take 1 tablet (10 mg total) by mouth daily. (Patient not taking:  Reported on 03/31/2019)  . atorvastatin (LIPITOR) 40 MG tablet Take 1 tablet (40 mg total) by mouth daily. (Patient not taking: Reported on 03/31/2019)  . benazepril (LOTENSIN) 40 MG tablet Take 1 tablet (40 mg total) by mouth daily. (Patient not taking: Reported on 03/31/2019)  . folic acid (FOLVITE) 1 MG tablet Take 1 tablet (1 mg total) by mouth daily. (Patient not taking: Reported on 03/31/2019)  . Multiple Vitamin (MULTIVITAMIN WITH MINERALS) TABS tablet Take 1 tablet by mouth daily. (Patient not taking: Reported on 03/31/2019)  . omeprazole (PRILOSEC) 40 MG capsule Take 40 mg by mouth daily.  . tadalafil (CIALIS) 5 MG tablet Take 1 tablet (5 mg total) by mouth daily as needed for erectile dysfunction. (Patient not taking: Reported on 03/31/2019)  . thiamine 100 MG tablet Take 1 tablet (100 mg total) by mouth daily. (Patient not taking: Reported on 03/31/2019)    Allergies No Known Allergies  Family History Family History  Problem Relation Age of Onset  . Cancer Mother   . Hypertension Father   . Cancer Paternal Grandmother    Social History  reports that he has never smoked. He has never used smokeless tobacco. He reports that he does not drink alcohol or use drugs.  Review Of Systems:   Constitutional: Reports generalized malaise HENT: Negative for congestion and rhinorrhea.  Eyes: Negative for redness and visual disturbance.  Respiratory: Negative for shortness of breath and wheezing but reports cough and  congestion. Cardiovascular: Negative for chest pain and palpitations.  Gastrointestinal: Positive for nausea , vomiting, abdominal pain and loose stools Genitourinary: Negative for dysuria and urgency.  Endocrine: Denies polyuria, polyphagia and heat intolerance Musculoskeletal: Negative for myalgias and arthralgias.  Skin: Negative for pallor and wound.  Neurological: Negative for dizziness and headaches   VITAL SIGNS: BP (!) 151/98   Pulse (!) 117   Temp 98.2 F (36.8 C)  (Oral)   Resp (!) 23   Ht 5\' 9"  (1.753 m)   Wt 78 kg   SpO2 95%   BMI 25.40 kg/m   HEMODYNAMICS:    VENTILATOR SETTINGS:    INTAKE / OUTPUT: No intake/output data recorded.  PHYSICAL EXAMINATION: General: Disheveled, acutely ill looking HEENT: [place]/AT, PERRLA, trachea midline Neuro: confused, moves all extremities Cardiovascular: AP tachycardic, S1/S2, no MRG, no edma Lungs:  Bilateral breath sounds with diffuse rhonchi in all lung fields Abdomen: mildly distended, +BS, no organomegaly Musculoskeletal:  +rom, no deformities Skin: multiple bruises in BL upper and lower extremities  LABS:  BMET Recent Labs  Lab 03/31/19 1950 04/01/19 0302  NA 128* 128*  K 3.2* 3.5  CL 78* 87*  CO2 12* 27  BUN 17 19  CREATININE 1.54* 1.02  GLUCOSE 82 120*    Electrolytes Recent Labs  Lab 03/31/19 1950 04/01/19 0302  CALCIUM 13.4* 10.5*  MG 2.2  --     CBC Recent Labs  Lab 03/31/19 1950 04/01/19 0302  WBC 23.0* 15.4*  HGB 6.3* 6.9*  HCT 25.7* 25.1*  PLT 386 206    Coag's Recent Labs  Lab 04/01/19 0302  INR 1.2    Sepsis Markers Recent Labs  Lab 03/31/19 2043 04/01/19 0302  LATICACIDVEN >11.0* 5.2*    ABG Recent Labs  Lab 03/31/19 2213  PHART 7.37  PCO2ART 30*  PO2ART 74*    Liver Enzymes Recent Labs  Lab 03/31/19 1950  AST 213*  ALT 98*  ALKPHOS 280*  BILITOT 1.9*  ALBUMIN 2.9*    Cardiac Enzymes Recent Labs  Lab 03/31/19 1950  TROPONINI 0.04*    Glucose No results for input(s): GLUCAP in the last 168 hours.  Imaging Dg Abd 1 View  Result Date: 04/01/2019 CLINICAL DATA:  NG tube placement. EXAM: ABDOMEN - 1 VIEW COMPARISON:  None. FINDINGS: Tip and side port of the enteric tube below the diaphragm in the stomach. High-density material in the colon suggestive of enteric contrast. No bowel dilatation in the upper abdomen. IMPRESSION: Tip and side port of the enteric tube below the diaphragm in the stomach. Electronically Signed    By: Keith Rake M.D.   On: 04/01/2019 03:53   Ct Head Wo Contrast  Result Date: 03/31/2019 CLINICAL DATA:  Head trauma EXAM: CT HEAD WITHOUT CONTRAST TECHNIQUE: Contiguous axial images were obtained from the base of the skull through the vertex without intravenous contrast. COMPARISON:  None. FINDINGS: Brain: No acute territorial infarction, hemorrhage, or intracranial mass. Atrophy and mild small vessel ischemic changes of the white matter. Slight ventricular prominence felt secondary to atrophy. Vascular: No hyperdense vessels. Scattered calcifications at the carotid siphon Skull: Normal. Negative for fracture or focal lesion. Sinuses/Orbits: No acute finding. Mucosal thickening in the right maxillary sinus. Other: None IMPRESSION: 1. No CT evidence for acute intracranial abnormality. 2. Atrophy and mild small vessel ischemic changes of the white matter Electronically Signed   By: Donavan Foil M.D.   On: 03/31/2019 20:23   Portable Chest 1 View  Result Date: 04/01/2019  CLINICAL DATA:  Dyspnea. EXAM: PORTABLE CHEST 1 VIEW COMPARISON:  Radiographs yesterday. FINDINGS: Enteric tube in place with tip and side-port below the diaphragm. Slight progression in heterogeneous bilateral bibasilar opacities from prior exam. Borderline cardiomegaly is again seen. Low lung volumes. No pleural effusion or pneumothorax. IMPRESSION: 1. Slight progression in bibasilar heterogeneous opacities from prior exam, may be aspiration or pneumonia. 2. Lower lung volumes from prior. Electronically Signed   By: Keith Rake M.D.   On: 04/01/2019 03:55   Dg Chest Port 1 View  Result Date: 03/31/2019 CLINICAL DATA:  Weakness EXAM: PORTABLE CHEST 1 VIEW COMPARISON:  CT 12/09/2017 FINDINGS: Mild reticular opacities at the bases. No consolidation or effusion. Borderline heart size. No pneumothorax. IMPRESSION: Mild reticular opacities in the right greater than left lung base, could reflect atypical infection. Borderline  cardiomegaly. Electronically Signed   By: Donavan Foil M.D.   On: 03/31/2019 20:40   Dg Knee Complete 4 Views Left  Result Date: 03/31/2019 CLINICAL DATA:  Status post fall. EXAM: LEFT KNEE - COMPLETE 4+ VIEW COMPARISON:  None. FINDINGS: No evidence of fracture, or dislocation. Small suprapatellar joint effusion. Post total left knee arthroplasty with normal alignment of the orthopedic components and no evidence of loosening. Mild suprapatellar soft tissue swelling. IMPRESSION: 1. No acute fracture or dislocation identified about the left knee. 2. Small suprapatellar joint effusion. Electronically Signed   By: Fidela Salisbury M.D.   On: 03/31/2019 20:40   STUDIES:  none  CULTURES: Blood cultures x2 Urine culture  ANTIBIOTICS: Unasyn for aspiration pneumonia  SIGNIFICANT EVENTS: 04/01/2019: Admitted  LINES/TUBES: Peripheral IVs  DISCUSSION: 63 year old male with a history of GI bleed and alcohol abuse presenting with acute GI bleed, acute blood loss anemia, aspiration pneumonia  ASSESSMENT  Acute GI bleed Aspiration pneumonia Acute blood loss anemia Generalized weakness Alcohol abuse Severe lactic acidosis Rhabdomyolysis Acute renal failure Hypokalemia Elevated LFTs due to alcohol abuse  PLAN GI consulted Continue octreotide and Protonix drip Antibiotics as above for an aspiration pneumonia Trend procalcitonin and adjust antibiotics Transfuse 2 units of packed red blood cells Trend hemoglobin and hematocrit Insert nasogastric tube to and connected to low intermittent suction Aspiration precautions IV hydration to maintain mean arterial blood pressure greater than 65 Trend CK and creatinine Monitor and correct electrolytes Trend LFTs CIWA protocol Trend lactic acid   Best Practice: Code Status: Full code Diet: N.p.o. GI prophylaxis: Already on Protonix drip VTE prophylaxis: SCDs/pharmacologic DVT prophylaxis not indicated due to GI bleed  FAMILY  -  Updates: Family updated by admitting service and emergency room.  Will update with any new changes in treatment plan or patient condition   S. Tukov-Yual ANP-BC Pulmonary and [place] Pager [phone] or [phone]  NB: This document was prepared using Dragon voice recognition software and may include unintentional dictation errors.    04/01/2019, 4:12 AM

## 2019-04-01 NOTE — Progress Notes (Signed)
CODE SEPSIS - PHARMACY COMMUNICATION  **Broad Spectrum Antibiotics should be administered within 1 hour of Sepsis diagnosis**  Time Code Sepsis Called/Page Received: n/a  Antibiotics Ordered: ceftriaxone/vanc/cefepime  Time of 1st antibiotic administration: 2105  Additional action taken by pharmacy:   If necessary, Name of Provider/Nurse Contacted:     Tobie Lords ,PharmD Clinical Pharmacist  04/01/2019  12:00 AM

## 2019-04-01 NOTE — H&P (Signed)
[facility] [place] NAME: George Wade    MR#:  [id number]  DATE OF BIRTH:  August 04, 1956  DATE OF ADMISSION:  03/31/2019  PRIMARY CARE PHYSICIAN: Patient, [name] Per   REQUESTING/REFERRING PHYSICIAN: Charlotte Crumb, MD  CHIEF COMPLAINT:   Chief Complaint  Patient presents with   Weakness   Diarrhea    HISTORY OF PRESENT ILLNESS:  George Wade  is a 63 y.o. 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    He was brought to the emergency room by EMS services after being found outside his home.  Patient reports having laid in the rain for approximately 4 hours prior to EMS services being called by his neighbor.  He reports becoming progressively weak over the last month with diarrhea as well as nausea and vomiting over the last 2 days.  Stools have been black and tarry at times with coffee-ground emesis according to the patient's report.  Coffee-ground emesis has been noted in the emergency room as well.  Patient endorses abdominal pain.  He continues to drink at least 1 L of wine a day and has drank up to 3 L a day in the past.  He denies chest pain or shortness of breath.  He notes no fevers or chills.  He denies diaphoresis.  Patient has noticeable tremor with his last alcoholic intake having been 2 days ago and his report.  According to patient's medical record, he has a history of seizure related to alcohol withdrawal.  On review of patient's prior records, he has a history of GI bleed with EGD performed at [facility] on 09/29/2018 finding severe ulcerating esophagitis of the entire mid and distal esophagus.  There was no evidence of varices or active esophageal bleeding.  Findings also included 2 chronic appearing clean-based  ulcers in the gastric fundus.  CT brain completed on his arrival with no acute intracranial abnormalities seen. Chest x-ray demonstrates mild reticular opacities in the right and left lung base possibly reflecting atypical infection.  Hemoglobin is 6.3 with hematocrit 25.7 and platelet count 386.  Total CK is 1674.  Lactic acid is greater than 11.  WBC is 23,000.  Potassium is 3.2.  Alcohol level less than 10.  Patient is receiving 2 units packed red blood cells for blood loss anemia.  Protonix and octreotide infusions have been initiated.  He received 2 L normal saline as well as IV potassium replacement.  He has been started on broad-spectrum antibiotic therapy.  Dr. Allen Norris was consulted by the ED physician regarding GI bleed.  Patient was admitted by the hospitalist service with transfer of care to the ICU.  I have spoken with Dr. Baker Janus. Link physician. PAST MEDICAL HISTORY:   Past Medical History:  Diagnosis Date   ED (erectile dysfunction)    Hypercholesteremia    Hypertension    Lung cancer ([place])     PAST SURGICAL HISTORY:   Past Surgical History:  Procedure Laterality Date   HERNIA REPAIR  1958   REPLACEMENT TOTAL KNEE BILATERAL  2009   TONSILLECTOMY  2008    SOCIAL HISTORY:   Social History   Tobacco Use   Smoking status: Never Smoker   Smokeless tobacco: Never Used  Substance Use Topics   Alcohol use: No  Frequency: Never    Comment: Quit 2 days ago    FAMILY HISTORY:   Family History  Problem Relation Age of Onset   Cancer Mother    Hypertension Father    Cancer Paternal Grandmother     DRUG ALLERGIES:  No Known Allergies  REVIEW OF SYSTEMS:   Review of Systems  Constitutional: Positive for malaise/fatigue. Negative for chills and fever.  HENT: Negative for congestion, nosebleeds, sinus pain and sore throat.   Eyes: Negative for blurred vision, double vision and pain.  Respiratory: Negative for cough and shortness of breath.     Cardiovascular: Negative for chest pain, palpitations, orthopnea and leg swelling.  Gastrointestinal: Positive for abdominal pain, diarrhea, heartburn, melena, nausea and vomiting. Negative for constipation.  Genitourinary: Negative for dysuria, flank pain and hematuria.  Musculoskeletal: Positive for falls (twice outside his home). Negative for back pain, joint pain, myalgias and neck pain.  Skin: Negative for itching and rash.  Neurological: Positive for tremors and weakness. Negative for dizziness, sensory change, speech change, focal weakness, loss of consciousness and headaches.  Psychiatric/Behavioral: Positive for substance abuse (drinks one liter wine a day).      MEDICATIONS AT HOME:   Prior to Admission medications   Medication Sig Start Date End Date Taking? Authorizing Provider  amLODipine (NORVASC) 10 MG tablet Take 1 tablet (10 mg total) by mouth daily. Patient not taking: Reported on 03/31/2019 12/09/17   Harvest Dark, MD  atorvastatin (LIPITOR) 40 MG tablet Take 1 tablet (40 mg total) by mouth daily. Patient not taking: Reported on 03/31/2019 12/09/17   Harvest Dark, MD  benazepril (LOTENSIN) 40 MG tablet Take 1 tablet (40 mg total) by mouth daily. Patient not taking: Reported on 03/31/2019 12/09/17   Harvest Dark, MD  folic acid (FOLVITE) 1 MG tablet Take 1 tablet (1 mg total) by mouth daily. Patient not taking: Reported on 03/31/2019 12/12/17   Salary, Avel Peace, MD  Multiple Vitamin (MULTIVITAMIN WITH MINERALS) TABS tablet Take 1 tablet by mouth daily. Patient not taking: Reported on 03/31/2019 12/12/17   Salary, Holly Bodily D, MD  omeprazole (PRILOSEC) 40 MG capsule Take 40 mg by mouth daily.    [provider]  tadalafil (CIALIS) 5 MG tablet Take 1 tablet (5 mg total) by mouth daily as needed for erectile dysfunction. Patient not taking: Reported on 03/31/2019 10/08/17   Norval Gable, MD  thiamine 100 MG tablet Take 1 tablet (100 mg total) by mouth  daily. Patient not taking: Reported on 03/31/2019 12/12/17   Salary, Holly Bodily D, MD      VITAL SIGNS:  Blood pressure (!) 126/91, pulse (!) 124, temperature 98.2 F (36.8 C), temperature source Oral, resp. rate 20, height 5\' 9"  (1.753 m), weight 78 kg, SpO2 99 %.  PHYSICAL EXAMINATION:  Physical Exam Constitutional:      Appearance: He is ill-appearing.  HENT:     Head: Normocephalic.     Right Ear: External ear normal.     Left Ear: External ear normal.     Nose: Nose normal. No congestion.     Mouth/Throat:     Mouth: Mucous membranes are moist.     Pharynx: Oropharynx is clear.  Eyes:     General: No scleral icterus.    Extraocular Movements: Extraocular movements intact.     Conjunctiva/sclera: Conjunctivae normal.     Pupils: Pupils are equal, round, and reactive to light.  Neck:     Musculoskeletal: Normal range of motion and neck supple.  Cardiovascular:     Rate and Rhythm: Regular rhythm. Tachycardia present.     Pulses: Normal pulses.     Heart sounds: Normal heart sounds. No murmur. No friction rub. No gallop.   Pulmonary:     Effort: Pulmonary effort is normal.     Breath sounds: Rhonchi (BLL) present. No wheezing or rales.  Abdominal:     General: Bowel sounds are normal.     Palpations: Abdomen is soft.     Tenderness: There is no abdominal tenderness. There is no right CVA tenderness, left CVA tenderness, guarding or rebound.     Hernia: No hernia is present.  Musculoskeletal: Normal range of motion.        General: No swelling.     Right lower leg: No edema.     Left lower leg: No edema.  Skin:    Capillary Refill: Capillary refill takes less than 2 seconds.     Findings: Bruising and lesion (abrasions to bilateral knees and lower legs) present.  Neurological:     Mental Status: He is alert and oriented to person, place, and time.     Motor: Weakness present.  Psychiatric:        Behavior: Behavior normal.     LABORATORY PANEL:   CBC Recent Labs   Lab 03/31/19 1950  WBC 23.0*  HGB 6.3*  HCT 25.7*  PLT 386   ------------------------------------------------------------------------------------------------------------------  Chemistries  Recent Labs  Lab 03/31/19 1950  NA 128*  K 3.2*  CL 78*  CO2 12*  GLUCOSE 82  BUN 17  CREATININE 1.54*  CALCIUM 13.4*  MG 2.2  AST 213*  ALT 98*  ALKPHOS 280*  BILITOT 1.9*   ------------------------------------------------------------------------------------------------------------------  Cardiac Enzymes Recent Labs  Lab 03/31/19 1950  TROPONINI 0.04*   ------------------------------------------------------------------------------------------------------------------  RADIOLOGY:  Ct Head Wo Contrast  Result Date: 03/31/2019 CLINICAL DATA:  Head trauma EXAM: CT HEAD WITHOUT CONTRAST TECHNIQUE: Contiguous axial images were obtained from the base of the skull through the vertex without intravenous contrast. COMPARISON:  None. FINDINGS: Brain: No acute territorial infarction, hemorrhage, or intracranial mass. Atrophy and mild small vessel ischemic changes of the white matter. Slight ventricular prominence felt secondary to atrophy. Vascular: No hyperdense vessels. Scattered calcifications at the carotid siphon Skull: Normal. Negative for fracture or focal lesion. Sinuses/Orbits: No acute finding. Mucosal thickening in the right maxillary sinus. Other: None IMPRESSION: 1. No CT evidence for acute intracranial abnormality. 2. Atrophy and mild small vessel ischemic changes of the white matter Electronically Signed   By: Donavan Foil M.D.   On: 03/31/2019 20:23   Dg Chest Port 1 View  Result Date: 03/31/2019 CLINICAL DATA:  Weakness EXAM: PORTABLE CHEST 1 VIEW COMPARISON:  CT 12/09/2017 FINDINGS: Mild reticular opacities at the bases. No consolidation or effusion. Borderline heart size. No pneumothorax. IMPRESSION: Mild reticular opacities in the right greater than left lung base, could  reflect atypical infection. Borderline cardiomegaly. Electronically Signed   By: Donavan Foil M.D.   On: 03/31/2019 20:40   Dg Knee Complete 4 Views Left  Result Date: 03/31/2019 CLINICAL DATA:  Status post fall. EXAM: LEFT KNEE - COMPLETE 4+ VIEW COMPARISON:  None. FINDINGS: No evidence of fracture, or dislocation. Small suprapatellar joint effusion. Post total left knee arthroplasty with normal alignment of the orthopedic components and no evidence of loosening. Mild suprapatellar soft tissue swelling. IMPRESSION: 1. No acute fracture or dislocation identified about the left knee. 2. Small suprapatellar joint effusion. Electronically Signed   By:  Fidela Salisbury M.D.   On: 03/31/2019 20:40      IMPRESSION AND PLAN:   1.  GI bleed - Hemoglobin 6.3.  Patient received 2 units packed red blood cells.  We will continue hemoglobin hematocrit every 4 hours and transfuse if necessary. - Protonix and octreotide infusions initiated - Dr. Allen Norris consulted by the ED physician with plans for management discussed.  Planning to follow patient with no indication for emergent GI procedure given multiple significant comorbidities.  2.  Lactic acidosis - He received 2 L normal saline bolus currently with normal saline infusing to peripheral IV at 125 cc/h. - Broad-spectrum antibiotic coverage initiated with cefepime and vancomycin for possibility of aspiration pneumonia - Will recheck lactic acid per protocol - Blood cultures pending - Repeat chest x-ray in the a.m.  3.  Acute kidney injury - Likely secondary to dehydration.  Will follow BUN and creatinine closely with repeated BMP in the a.m.  4.  Alcoholism/EtOH abuse-last reported alcohol consumption was 48 hours ago - Alcohol level less than 10 on arrival - CIWA protocol initiated - IV thiamine and folic acid - Substance abuse cessation and rehabilitation options discussed.  5.  Hypokalemia - Patient received IV potassium replacement - We  will repeat potassium level in the a.m. with BMP -Telemetry monitoring in ICU setting  6.Leukocytosis --Possibly inflammatory response with GI bleed - May be related to aspiration pneumonia.  Therefore broad-spectrum IV antibiotic therapy has been initiated with cefepime and vancomycin.  Will repeat chest x-ray in the a.m. - Blood cultures are pending - We will repeat CBC in the a.m.  DVT prophylaxis initiated with SCDs.    All the records are reviewed and case discussed with ED provider. The plan of care was discussed in details with the patient (and family). I answered all questions. The patient agreed to proceed with the above mentioned plan. Further management will depend upon hospital course.   CODE STATUS: Full code  TOTAL TIME TAKING CARE OF THIS PATIENT: 45 minutes.    [place] on 04/01/2019 at 12:48 AM  Pager - [phone]  After 6pm go to [email or web address] - Proofreader  [facility]  Office  [phone]  CC: Primary care physician; Patient, [name] Per   Note: This dictation was prepared with Dragon dictation along with smaller phrase technology. Any transcriptional errors that result from this process are unintentional.

## 2019-04-01 NOTE — Consult Note (Signed)
George Lame, MD [facility]  [address], [place], [place] [address] Phone: [phone] Fax : [phone]  Consultation  Referring Provider:     Dr. Burlene Arnt Primary Care Physician:  Patient, [name] Per Primary Gastroenterologist: Althia Forts         Reason for Consultation:     GI bleed  Date of Admission:  03/31/2019 Date of Consultation:  04/01/2019         HPI:   George Wade is a 63 y.o. male who was admitted with lactic acidosis leukocytosis hypokalemia alcohol abuse and possible rhabdomyolysis.  The patient has a history of alcohol abuse with drinking 1 L of wine a day and has drank up to 3 L/day in the past.  Patient is not a great historian but states that he has been feeling weak and tired for over a month and then was found down outside of his house and he reported that he was laying there for 5-1/2 hours.  He reports that he has had black tarry stools and some coffee ground emesis in the past.  It was reported by the ER that he actually had hematemesis with coffee-ground emesis in the ER.  The patient has had an upper endoscopy at an outside hospital back in 2019 with severe erosive esophagitis with the findings of 2 chronic appearing clean-based ulcers in the gastric fundus. The patient's lactic acid on admission was greater than 11 and the patient was treated with IV fluids.  The patient was also found to have a hemoglobin of 6.3 with his previous hemoglobin in March 2012 0.0.  This morning the hemoglobin was 7.6.  His lactic acid remains elevated but is only 5.2 this morning.  His INR was also 1.2. The patient CK on admission was 1674 and this morning it is down to 1017.  Past Medical History:  Diagnosis Date  . ED (erectile dysfunction)   . Hypercholesteremia   . Hypertension   . Lung cancer [facility])     Past Surgical History:  Procedure Laterality Date  . HERNIA REPAIR  1958  . REPLACEMENT TOTAL KNEE BILATERAL  2009  . TONSILLECTOMY  2008    Prior to Admission  medications   Medication Sig Start Date End Date Taking? Authorizing Provider  amLODipine (NORVASC) 10 MG tablet Take 1 tablet (10 mg total) by mouth daily. Patient not taking: Reported on 03/31/2019 12/09/17   Harvest Dark, MD  atorvastatin (LIPITOR) 40 MG tablet Take 1 tablet (40 mg total) by mouth daily. Patient not taking: Reported on 03/31/2019 12/09/17   Harvest Dark, MD  benazepril (LOTENSIN) 40 MG tablet Take 1 tablet (40 mg total) by mouth daily. Patient not taking: Reported on 03/31/2019 12/09/17   Harvest Dark, MD  folic acid (FOLVITE) 1 MG tablet Take 1 tablet (1 mg total) by mouth daily. Patient not taking: Reported on 03/31/2019 12/12/17   Salary, Avel Peace, MD  Multiple Vitamin (MULTIVITAMIN WITH MINERALS) TABS tablet Take 1 tablet by mouth daily. Patient not taking: Reported on 03/31/2019 12/12/17   Salary, Holly Bodily D, MD  omeprazole (PRILOSEC) 40 MG capsule Take 40 mg by mouth daily.    [provider]  tadalafil (CIALIS) 5 MG tablet Take 1 tablet (5 mg total) by mouth daily as needed for erectile dysfunction. Patient not taking: Reported on 03/31/2019 10/08/17   Norval Gable, MD  thiamine 100 MG tablet Take 1 tablet (100 mg total) by mouth daily. Patient not taking: Reported on 03/31/2019 12/12/17  Salary, Avel Peace, MD    Family History  Problem Relation Age of Onset  . Cancer Mother   . Hypertension Father   . Cancer Paternal Grandmother      Social History   Tobacco Use  . Smoking status: Never Smoker  . Smokeless tobacco: Never Used  Substance Use Topics  . Alcohol use: No    Frequency: Never    Comment: Quit 2 days ago  . Drug use: No    Allergies as of 03/31/2019  . (No Known Allergies)    Review of Systems:    All systems reviewed and negative except where noted in HPI.   Physical Exam:  Vital signs in last 24 hours: Temp:  [97.7 F (36.5 C)-98.7 F (37.1 C)] 98.7 F (37.1 C) (06/21 0800) Pulse Rate:  [105-124] 106 (06/21 1000)  Resp:  [18-28] 19 (06/21 1000) BP: (121-152)/(77-100) 141/94 (06/21 1000) SpO2:  [93 %-99 %] 93 % (06/21 1000) Weight:  [78 kg] 78 kg (06/20 1912) Last BM Date: (PTA) General:   Pleasant, cooperative in NAD Head:  Normocephalic and atraumatic. Eyes:   No icterus.   Conjunctiva pink. PERRLA. Ears:  Normal auditory acuity. Neck:  Supple; no masses or thyroidomegaly Lungs: Respirations even and unlabored. Lungs with diffuse rhonchi.  Heart: Tachycardia;  Without murmur, clicks, rubs or gallops Abdomen:  Soft, nondistended, nontender. Normal bowel sounds. No appreciable masses or hepatomegaly.  No rebound or guarding.  Rectal:  Not performed. Msk:  Symmetrical without gross deformities.    Extremities:  Without edema, cyanosis or clubbing. Neurologic:  Alert ;  grossly normal neurologically. Skin:  Intact without significant lesions or rashes. Cervical Nodes:  No significant cervical adenopathy. Psych:  Alert and cooperative. Normal affect.  LAB RESULTS: Recent Labs    03/31/19 1950 04/01/19 0302 04/01/19 0737  WBC 23.0* 15.4*  --   HGB 6.3* 6.9* 7.6*  HCT 25.7* 25.1* 26.3*  PLT 386 206  --    BMET Recent Labs    03/31/19 1950 04/01/19 0302  NA 128* 128*  K 3.2* 3.5  CL 78* 87*  CO2 12* 27  GLUCOSE 82 120*  BUN 17 19  CREATININE 1.54* 1.02  CALCIUM 13.4* 10.5*   LFT Recent Labs    03/31/19 1950  PROT 6.3*  ALBUMIN 2.9*  AST 213*  ALT 98*  ALKPHOS 280*  BILITOT 1.9*   PT/INR Recent Labs    04/01/19 0302  LABPROT 15.2  INR 1.2    STUDIES: Dg Abd 1 View  Result Date: 04/01/2019 CLINICAL DATA:  NG tube placement. EXAM: ABDOMEN - 1 VIEW COMPARISON:  None. FINDINGS: Tip and side port of the enteric tube below the diaphragm in the stomach. High-density material in the colon suggestive of enteric contrast. No bowel dilatation in the upper abdomen. IMPRESSION: Tip and side port of the enteric tube below the diaphragm in the stomach. Electronically Signed   By:  Keith Rake M.D.   On: 04/01/2019 03:53   Ct Head Wo Contrast  Result Date: 03/31/2019 CLINICAL DATA:  Head trauma EXAM: CT HEAD WITHOUT CONTRAST TECHNIQUE: Contiguous axial images were obtained from the base of the skull through the vertex without intravenous contrast. COMPARISON:  None. FINDINGS: Brain: No acute territorial infarction, hemorrhage, or intracranial mass. Atrophy and mild small vessel ischemic changes of the white matter. Slight ventricular prominence felt secondary to atrophy. Vascular: No hyperdense vessels. Scattered calcifications at the carotid siphon Skull: Normal. Negative for fracture or focal lesion. Sinuses/Orbits:  No acute finding. Mucosal thickening in the right maxillary sinus. Other: None IMPRESSION: 1. No CT evidence for acute intracranial abnormality. 2. Atrophy and mild small vessel ischemic changes of the white matter Electronically Signed   By: Donavan Foil M.D.   On: 03/31/2019 20:23   Portable Chest 1 View  Result Date: 04/01/2019 CLINICAL DATA:  Dyspnea. EXAM: PORTABLE CHEST 1 VIEW COMPARISON:  Radiographs yesterday. FINDINGS: Enteric tube in place with tip and side-port below the diaphragm. Slight progression in heterogeneous bilateral bibasilar opacities from prior exam. Borderline cardiomegaly is again seen. Low lung volumes. No pleural effusion or pneumothorax. IMPRESSION: 1. Slight progression in bibasilar heterogeneous opacities from prior exam, may be aspiration or pneumonia. 2. Lower lung volumes from prior. Electronically Signed   By: Keith Rake M.D.   On: 04/01/2019 03:55   Dg Chest Port 1 View  Result Date: 03/31/2019 CLINICAL DATA:  Weakness EXAM: PORTABLE CHEST 1 VIEW COMPARISON:  CT 12/09/2017 FINDINGS: Mild reticular opacities at the bases. No consolidation or effusion. Borderline heart size. No pneumothorax. IMPRESSION: Mild reticular opacities in the right greater than left lung base, could reflect atypical infection. Borderline  cardiomegaly. Electronically Signed   By: Donavan Foil M.D.   On: 03/31/2019 20:40   Dg Knee Complete 4 Views Left  Result Date: 03/31/2019 CLINICAL DATA:  Status post fall. EXAM: LEFT KNEE - COMPLETE 4+ VIEW COMPARISON:  None. FINDINGS: No evidence of fracture, or dislocation. Small suprapatellar joint effusion. Post total left knee arthroplasty with normal alignment of the orthopedic components and no evidence of loosening. Mild suprapatellar soft tissue swelling. IMPRESSION: 1. No acute fracture or dislocation identified about the left knee. 2. Small suprapatellar joint effusion. Electronically Signed   By: Fidela Salisbury M.D.   On: 03/31/2019 20:40      Impression / Plan:   Assessment: Active Problems:   GI bleed   George Wade is a 63 y.o. y/o male with who was admitted after being found laying outside of his home for 5-1/2 hours.  The patient is confused and reports that he thinks it is 68 but knows he is in the hospital but is not sure which hospital.  The patient had hematemesis and reports dark stools.  The patient has a history of severe esophagitis on an EGD last year.  There was also clean-based ulcers that time.  The patient's labs showed lactic acidosis with hypokalemia and rhabdo.  The patient's hemoglobin appears to be stable and he has had no further GI bleeding since admitted to the ICU.  Plan: This patient likely has esophagitis and possibly peptic ulcer disease similarly will was found on his last EGD.  The patient is acutely ill and will need to be stabilized before any further GI work-up is instituted.  When the patient is more stable and possibly out of the ICU I would recommend a repeat upper endoscopy.  This can be done more urgently if the patient should start to decompensate with an acute GI bleed.  I have spoken to the hospitalist about the plan and I have also informed the patient.  Thank you for involving me in the care of this patient.      LOS: 1 day    George Lame, MD  04/01/2019, 11:22 AM    Note: This dictation was prepared with Dragon dictation along with smaller phrase technology. Any transcriptional errors that result from this process are unintentional.

## 2019-04-01 NOTE — Progress Notes (Signed)
PHARMACY - PHYSICIAN COMMUNICATION CRITICAL VALUE ALERT - BLOOD CULTURE IDENTIFICATION (BCID)  George Wade is an 63 y.o. male who presented to [facility] on 03/31/2019 with a chief complaint of weakness and diarrhea.   Assessment:  63 year old admitted with aspiration pneumonia ordered Unasyn.  Name of physician (or Provider) Contacted: [facility]  Current antibiotics: Unasyn  Changes to prescribed antibiotics recommended:  Patient is on recommended antibiotics - No changes needed  Results for orders placed or performed during the hospital encounter of 03/31/19  Blood Culture ID Panel (Reflexed) (Collected: 03/31/2019  8:43 PM)  Result Value Ref Range   Enterococcus species NOT DETECTED NOT DETECTED   Listeria monocytogenes NOT DETECTED NOT DETECTED   Staphylococcus species DETECTED (A) NOT DETECTED   Staphylococcus aureus (BCID) NOT DETECTED NOT DETECTED   Methicillin resistance DETECTED (A) NOT DETECTED   Streptococcus species NOT DETECTED NOT DETECTED   Streptococcus agalactiae NOT DETECTED NOT DETECTED   Streptococcus pneumoniae NOT DETECTED NOT DETECTED   Streptococcus pyogenes NOT DETECTED NOT DETECTED   Acinetobacter baumannii NOT DETECTED NOT DETECTED   Enterobacteriaceae species NOT DETECTED NOT DETECTED   Enterobacter cloacae complex NOT DETECTED NOT DETECTED   Escherichia coli NOT DETECTED NOT DETECTED   Klebsiella oxytoca NOT DETECTED NOT DETECTED   Klebsiella pneumoniae NOT DETECTED NOT DETECTED   Proteus species NOT DETECTED NOT DETECTED   Serratia marcescens NOT DETECTED NOT DETECTED   Haemophilus influenzae NOT DETECTED NOT DETECTED   Neisseria meningitidis NOT DETECTED NOT DETECTED   Pseudomonas aeruginosa NOT DETECTED NOT DETECTED   Candida albicans NOT DETECTED NOT DETECTED   Candida glabrata NOT DETECTED NOT DETECTED   Candida krusei NOT DETECTED NOT DETECTED   Candida parapsilosis NOT DETECTED NOT DETECTED   Candida tropicalis NOT DETECTED NOT DETECTED     Annye Forrey 04/01/2019  1:50 PM

## 2019-04-01 NOTE — Progress Notes (Deleted)
Patient admitted early this morning.  Seen and examined by me later in the morning.  Please see H&P for further details.  -GI following-plan for repeat upper endoscopy when patient is stabilized -Lactic acidosis improving with fluids -AKI improving -Continue CIWA  Hyman Bible, MD

## 2019-04-01 NOTE — Progress Notes (Signed)
[facility] [place] NAME: George Wade    MR#:  [id number]  DATE OF BIRTH:  08-29-1956  SUBJECTIVE:   Feeling a little bit better this morning.  Denies any abdominal pain or additional episodes of vomiting.  REVIEW OF SYSTEMS:  ROS-difficult to obtain, as patient is not really answering questions consistently  DRUG ALLERGIES:  No Known Allergies VITALS:  Blood pressure (!) 140/106, pulse (!) 102, temperature 98.2 F (36.8 C), temperature source Oral, resp. rate (!) 23, height 5\' 9"  (1.753 m), weight 78 kg, SpO2 93 %. PHYSICAL EXAMINATION:  Physical Exam  GENERAL:  Laying in the bed with no acute distress, mildly disheveled appearing. HEENT: Head atraumatic, normocephalic. Pupils equal, round, reactive to light and accommodation. No scleral icterus. Extraocular muscles intact. Oropharynx and nasopharynx clear.  NECK:  Supple, no jugular venous distention. No thyroid enlargement. LUNGS: Lungs are clear to auscultation bilaterally. No wheezes, crackles, rhonchi. No use of accessory muscles of respiration.  CARDIOVASCULAR: Tachycardic, regular rhythm, S1, S2 normal. No murmurs, rubs, or gallops.  ABDOMEN: Soft, nontender, nondistended. Bowel sounds present.  EXTREMITIES: No pedal edema, cyanosis, or clubbing.  NEUROLOGIC: CN 2-12 intact, no focal deficits. 5/5 muscle strength throughout all extremities. Sensation intact throughout. Gait not checked.  PSYCHIATRIC: The patient is alert and oriented x 3.  SKIN: No obvious rash, lesion, or ulcer. Multiple ecchymoses present. LABORATORY PANEL:  Male CBC Recent Labs  Lab 04/01/19 0302 04/01/19 0737  WBC 15.4*  --   HGB 6.9* 7.6*  HCT 25.1* 26.3*  PLT 206  --    ------------------------------------------------------------------------------------------------------------------ Chemistries  Recent Labs  Lab 03/31/19 1950 04/01/19 0302  NA 128* 128*  K 3.2* 3.5  CL 78* 87*  CO2 12*  27  GLUCOSE 82 120*  BUN 17 19  CREATININE 1.54* 1.02  CALCIUM 13.4* 10.5*  MG 2.2  --   AST 213*  --   ALT 98*  --   ALKPHOS 280*  --   BILITOT 1.9*  --    RADIOLOGY:  Dg Abd 1 View  Result Date: 04/01/2019 CLINICAL DATA:  NG tube placement. EXAM: ABDOMEN - 1 VIEW COMPARISON:  None. FINDINGS: Tip and side port of the enteric tube below the diaphragm in the stomach. High-density material in the colon suggestive of enteric contrast. No bowel dilatation in the upper abdomen. IMPRESSION: Tip and side port of the enteric tube below the diaphragm in the stomach. Electronically Signed   By: Keith Rake M.D.   On: 04/01/2019 03:53   Ct Head Wo Contrast  Result Date: 03/31/2019 CLINICAL DATA:  Head trauma EXAM: CT HEAD WITHOUT CONTRAST TECHNIQUE: Contiguous axial images were obtained from the base of the skull through the vertex without intravenous contrast. COMPARISON:  None. FINDINGS: Brain: No acute territorial infarction, hemorrhage, or intracranial mass. Atrophy and mild small vessel ischemic changes of the white matter. Slight ventricular prominence felt secondary to atrophy. Vascular: No hyperdense vessels. Scattered calcifications at the carotid siphon Skull: Normal. Negative for fracture or focal lesion. Sinuses/Orbits: No acute finding. Mucosal thickening in the right maxillary sinus. Other: None IMPRESSION: 1. No CT evidence for acute intracranial abnormality. 2. Atrophy and mild small vessel ischemic changes of the white matter Electronically Signed   By: Donavan Foil M.D.   On: 03/31/2019 20:23   Portable Chest 1 View  Result Date: 04/01/2019 CLINICAL DATA:  Dyspnea. EXAM: PORTABLE CHEST 1 VIEW COMPARISON:  Radiographs yesterday. FINDINGS: Enteric tube in  place with tip and side-port below the diaphragm. Slight progression in heterogeneous bilateral bibasilar opacities from prior exam. Borderline cardiomegaly is again seen. Low lung volumes. No pleural effusion or pneumothorax.  IMPRESSION: 1. Slight progression in bibasilar heterogeneous opacities from prior exam, may be aspiration or pneumonia. 2. Lower lung volumes from prior. Electronically Signed   By: Keith Rake M.D.   On: 04/01/2019 03:55   Dg Chest Port 1 View  Result Date: 03/31/2019 CLINICAL DATA:  Weakness EXAM: PORTABLE CHEST 1 VIEW COMPARISON:  CT 12/09/2017 FINDINGS: Mild reticular opacities at the bases. No consolidation or effusion. Borderline heart size. No pneumothorax. IMPRESSION: Mild reticular opacities in the right greater than left lung base, could reflect atypical infection. Borderline cardiomegaly. Electronically Signed   By: Donavan Foil M.D.   On: 03/31/2019 20:40   Dg Knee Complete 4 Views Left  Result Date: 03/31/2019 CLINICAL DATA:  Status post fall. EXAM: LEFT KNEE - COMPLETE 4+ VIEW COMPARISON:  None. FINDINGS: No evidence of fracture, or dislocation. Small suprapatellar joint effusion. Post total left knee arthroplasty with normal alignment of the orthopedic components and no evidence of loosening. Mild suprapatellar soft tissue swelling. IMPRESSION: 1. No acute fracture or dislocation identified about the left knee. 2. Small suprapatellar joint effusion. Electronically Signed   By: Fidela Salisbury M.D.   On: 03/31/2019 20:40   ASSESSMENT AND PLAN:   GI bleed- likely due to PUD. -GI following-plan for EGD when patient is more stable -Continue octreotide and Protonix drips -NG tube in place to low intermittent suction  Acute blood loss anemia-hemoglobin improved s/p 2 units PRBC -Serial H/H -Transfuse for hemoglobin < 7  Lactic acidosis-improving -Continue IV fluids  Aspiration pneumonia -Trend procalcitonin -Continue IV antibiotics -Blood cultures pending  Acute rhabdomyolysis-due to being down on the ground for a few hours -IV fluids -Trend CK  Alcohol abuse- LFTs elevated -CIWA protocol  -IV thiamine and folic acid -Alcohol cessation counseling performed  this admission  Leukocytosis-improving -Blood cultures pending -Recheck WBC in the morning  DVT prophylaxis initiated with SCDs.  All the records are reviewed and case discussed with Care Management/Social Worker. Management plans discussed with the patient, family and they are in agreement.  CODE STATUS: Full Code  TOTAL TIME TAKING CARE OF THIS PATIENT: 40 minutes.   More than 50% of the time was spent in counseling/coordination of care: YES  POSSIBLE D/C IN 3-4 DAYS, DEPENDING ON CLINICAL CONDITION.   [name] Benn Tarver M.D on 04/01/2019 at 1:46 PM  Between 7am to 6pm - Pager - [phone]  After 6pm go to [email or web address] - Proofreader  [facility]  Office  [phone]  CC: Primary care physician; Patient, [name] Per  Note: This dictation was prepared with Dragon dictation along with smaller phrase technology. Any transcriptional errors that result from this process are unintentional.

## 2019-04-01 NOTE — Progress Notes (Signed)
Pharmacy Antibiotic Note  George Wade is a 63 y.o. male admitted on 03/31/2019 with aspiration pneumonia.  Pharmacy has been consulted for Unasyn dosing.  Plan: Will start Unasyn 3g IV q8h  Height: 5\' 9"  (175.3 cm) Weight: 172 lb (78 kg) IBW/kg (Calculated) : 70.7  Temp (24hrs), Avg:98 F (36.7 C), Min:97.7 F (36.5 C), Max:98.2 F (36.8 C)  Recent Labs  Lab 03/31/19 1950 03/31/19 2043  WBC 23.0*  --   CREATININE 1.54*  --   LATICACIDVEN  --  >11.0*    Estimated Creatinine Clearance: 49.7 mL/min (A) (by C-G formula based on SCr of 1.54 mg/dL (H)).    No Known Allergies  Thank you for allowing pharmacy to be a part of this patient's care.  Tobie Lords, PharmD, BCPS Clinical Pharmacist 04/01/2019 1:46 AM

## 2019-04-01 NOTE — ED Notes (Signed)
ED TO INPATIENT HANDOFF REPORT  ED Nurse Name and Phone #: Tryniti Laatsch [phone]  S Name/Age/Gender George Wade 63 y.o. male Room/Bed: ED25A/ED25A  Code Status   Code Status: Full Code  Home/SNF/Other Home Patient oriented to: self and place Is this baseline? No   Triage Complete: Triage complete  Chief Complaint Diarrhea  Triage Note Pt found outside - intermittent diarrhea x 1 month. Went out to the car to go to the store, fell from weakness and had diarrhea. Laid in the rain x approx 4 hrs before neighbors saw him and called ems   Allergies No Known Allergies  Level of Care/Admitting Diagnosis ED Disposition    ED Disposition Condition [place]: [place] [[id number]]  Level of Care: ICU [6]  Covid Evaluation: Confirmed COVID Negative  Diagnosis: GI bleed [409811]  Admitting Physician: Mayer Camel [[id number]]  Attending Physician: Mayer Camel [[id number]]  Estimated length of stay: 5 - 7 days  Certification:: I certify this patient will need inpatient services for at least 2 midnights  PT Class (Do Not Modify): Inpatient [101]  PT Acc Code (Do Not Modify): Private [1]       B Medical/Surgery History Past Medical History:  Diagnosis Date  . ED (erectile dysfunction)   . Hypercholesteremia   . Hypertension   . Lung cancer [facility])    Past Surgical History:  Procedure Laterality Date  . HERNIA REPAIR  1958  . REPLACEMENT TOTAL KNEE BILATERAL  2009  . TONSILLECTOMY  2008     A IV Location/Drains/Wounds Patient Lines/Drains/Airways Status   Active Line/Drains/Airways    Name:   Placement date:   Placement time:   Site:   Days:   Peripheral IV 03/31/19 Left Antecubital   03/31/19    1950    Antecubital   1          Intake/Output Last 24 hours No intake or output data in the 24 hours ending 04/01/19 0003  Labs/Imaging Results for orders placed or performed during the hospital encounter of 03/31/19 (from the past  48 hour(s))  CBC with Differential     Status: Abnormal   Collection Time: 03/31/19  7:50 PM  Result Value Ref Range   WBC 23.0 (H) 4.0 - 10.5 K/uL   RBC 3.19 (L) 4.22 - 5.81 MIL/uL   Hemoglobin 6.3 (L) 13.0 - 17.0 g/dL   HCT 25.7 (L) 39.0 - 52.0 %   MCV 80.6 80.0 - 100.0 fL   MCH 19.7 (L) 26.0 - 34.0 pg   MCHC 24.5 (L) 30.0 - 36.0 g/dL   RDW 25.6 (H) 11.5 - 15.5 %   Platelets 386 150 - 400 K/uL   nRBC 1.2 (H) 0.0 - 0.2 %   Neutrophils Relative % 96 %   Neutro Abs 22.1 (H) 1.7 - 7.7 K/uL   Lymphocytes Relative 1 %   Lymphs Abs 0.2 (L) 0.7 - 4.0 K/uL   Monocytes Relative 3 %   Monocytes Absolute 0.7 0.1 - 1.0 K/uL   Eosinophils Relative 0 %   Eosinophils Absolute 0.0 0.0 - 0.5 K/uL   Basophils Relative 0 %   Basophils Absolute 0.0 0.0 - 0.1 K/uL   WBC Morphology MORPHOLOGY UNREMARKABLE    Smear Review Normal platelet morphology    nRBC 5 (H) 0 /100 WBC   Abs Immature Granulocytes 0.00 0.00 - 0.07 K/uL   Dimorphism PRESENT    Polychromasia PRESENT     Comment: Performed  at [facility], [address] [place], [place] [address]  Ethanol     Status: None   Collection Time: 03/31/19  7:50 PM  Result Value Ref Range   Alcohol, Ethyl (B) <10 <10 mg/dL    Comment: (NOTE) Lowest detectable limit for serum alcohol is 10 mg/dL. For medical purposes only. Performed at [facility], [address] [place], [place] [address]   Comprehensive metabolic panel     Status: Abnormal   Collection Time: 03/31/19  7:50 PM  Result Value Ref Range   Sodium 128 (L) 135 - 145 mmol/L    Comment: LYTES REPEATED  MLK   Potassium 3.2 (L) 3.5 - 5.1 mmol/L   Chloride 78 (L) 98 - 111 mmol/L   CO2 12 (L) 22 - 32 mmol/L   Glucose, Bld 82 70 - 99 mg/dL   BUN 17 8 - 23 mg/dL   Creatinine, Ser 1.54 (H) 0.61 - 1.24 mg/dL   Calcium 13.4 (HH) 8.9 - 10.3 mg/dL    Comment: CRITICAL RESULT CALLED TO, READ BACK BY AND VERIFIED WITH Jumana Paccione AT 2055 ON 03/31/19 JJB    Total Protein  6.3 (L) 6.5 - 8.1 g/dL   Albumin 2.9 (L) 3.5 - 5.0 g/dL   AST 213 (H) 15 - 41 U/L    Comment: RESULT CONFIRMED BY MANUAL DILUTION JJB   ALT 98 (H) 0 - 44 U/L    Comment: RESULT CONFIRMED BY MANUAL DILUTION JJB   Alkaline Phosphatase 280 (H) 38 - 126 U/L   Total Bilirubin 1.9 (H) 0.3 - 1.2 mg/dL   GFR calc non Af Amer 48 (L) >60 mL/min   GFR calc Af Amer 55 (L) >60 mL/min   Anion gap 38 (H) 5 - 15    Comment: Performed at [facility], [address] [place], [place] [address]  Troponin I - Once     Status: Abnormal   Collection Time: 03/31/19  7:50 PM  Result Value Ref Range   Troponin I 0.04 (HH) <0.03 ng/mL    Comment: CRITICAL RESULT CALLED TO, READ BACK BY AND VERIFIED WITH Kin Galbraith AT 2055 ON 03/31/19 JJB Performed at [facility], [address] [place], [place] [address]   CK     Status: Abnormal   Collection Time: 03/31/19  7:50 PM  Result Value Ref Range   Total CK 1,674 (H) 49 - 397 U/L    Comment: Performed at [facility], [address] [place], [place] [address]  Urinalysis, Complete w Microscopic     Status: Abnormal   Collection Time: 03/31/19  7:50 PM  Result Value Ref Range   Color, Urine YELLOW (A) YELLOW   APPearance CLOUDY (A) CLEAR   Specific Gravity, Urine 1.013 1.005 - 1.030   pH 5.0 5.0 - 8.0   Glucose, UA NEGATIVE NEGATIVE mg/dL   Hgb urine dipstick LARGE (A) NEGATIVE   Bilirubin Urine NEGATIVE NEGATIVE   Ketones, ur 5 (A) NEGATIVE mg/dL   Protein, ur 30 (A) NEGATIVE mg/dL   Nitrite NEGATIVE NEGATIVE   Leukocytes,Ua NEGATIVE NEGATIVE   RBC / HPF 0-5 0 - 5 RBC/hpf   WBC, UA 0-5 0 - 5 WBC/hpf   Bacteria, UA RARE (A) NONE SEEN   Squamous Epithelial / LPF 0-5 0 - 5   Hyaline Casts, UA PRESENT    Granular Casts, UA PRESENT     Comment: Performed at [facility], [address] [place],  [address]  Magnesium     Status: None   Collection Time: 03/31/19  7:50 PM  Result Value Ref Range   Magnesium  2.2 1.7 - 2.4 mg/dL    Comment: Performed at [facility], [address] [place], [place] [address]  ABO/Rh     Status: None   Collection Time: 03/31/19  7:50 PM  Result Value Ref Range   ABO/RH(D)      B POS Performed at [facility], [address], [place], [place] [address]   SARS Coronavirus 2 (CEPHEID - Performed in [facility] hospital lab), Hosp Order     Status: None   Collection Time: 03/31/19  8:43 PM   Specimen: Nasopharyngeal Swab  Result Value Ref Range   SARS Coronavirus 2 NEGATIVE NEGATIVE    Comment: (NOTE) If result is NEGATIVE SARS-CoV-2 target nucleic acids are NOT DETECTED. The SARS-CoV-2 RNA is generally detectable in upper and lower  respiratory specimens during the acute phase of infection. The lowest  concentration of SARS-CoV-2 viral copies this assay can detect is 250  copies / mL. A negative result does not preclude SARS-CoV-2 infection  and should not be used as the sole basis for treatment or other  patient management decisions.  A negative result may occur with  improper specimen collection / handling, submission of specimen other  than nasopharyngeal swab, presence of viral mutation(s) within the  areas targeted by this assay, and inadequate number of viral copies  (<250 copies / mL). A negative result must be combined with clinical  observations, patient history, and epidemiological information. If result is POSITIVE SARS-CoV-2 target nucleic acids are DETECTED. The SARS-CoV-2 RNA is generally detectable in upper and lower  respiratory specimens dur ing the acute phase of infection.  Positive  results are indicative of active infection with SARS-CoV-2.  Clinical  correlation with patient history and other diagnostic information is  necessary to determine patient infection status.  Positive results do  not rule out bacterial infection or co-infection with other viruses. If result is PRESUMPTIVE POSTIVE SARS-CoV-2 nucleic  acids MAY BE PRESENT.   A presumptive positive result was obtained on the submitted specimen  and confirmed on repeat testing.  While 2019 novel coronavirus  (SARS-CoV-2) nucleic acids may be present in the submitted sample  additional confirmatory testing may be necessary for epidemiological  and / or clinical management purposes  to differentiate between  SARS-CoV-2 and other Sarbecovirus currently known to infect humans.  If clinically indicated additional testing with an alternate test  methodology [phone]) is advised. The SARS-CoV-2 RNA is generally  detectable in upper and lower respiratory sp ecimens during the acute  phase of infection. The expected result is Negative. Fact Sheet for Patients:  StrictlyIdeas.no Fact Sheet for Healthcare Providers: BankingDealers.co.za This test is not yet approved or cleared by the [place] FDA and has been authorized for detection and/or diagnosis of SARS-CoV-2 by FDA under an Emergency Use Authorization (EUA).  This EUA will remain in effect (meaning this test can be used) for the duration of the COVID-19 declaration under Section 564(b)(1) of the Act, 21 U.S.C. section 360bbb-3(b)(1), unless the authorization is terminated or revoked sooner. Performed at [facility], [address] [place], [place] [address]   Type and screen [place]     Status: None (Preliminary result)   Collection Time: 03/31/19  8:43 PM  Result Value Ref Range   ABO/RH(D) B POS    Antibody Screen NEG  Sample Expiration 04/03/2019,2359    Unit Number [id number]    Blood Component Type RBC LR PHER2    Unit division 00    Status of Unit ALLOCATED    Transfusion Status OK TO TRANSFUSE    Crossmatch Result Compatible    Unit Number [id number]    Blood Component Type RED CELLS,LR    Unit division 00    Status of Unit ISSUED    Transfusion Status OK TO TRANSFUSE     Crossmatch Result      Compatible Performed at [facility], [place], [place] [address]   Lactic acid, plasma     Status: Abnormal   Collection Time: 03/31/19  8:43 PM  Result Value Ref Range   Lactic Acid, Venous >11.0 (HH) 0.5 - 1.9 mmol/L    Comment: CRITICAL RESULT CALLED TO, READ BACK BY AND VERIFIED WITH Johnny Latu AT 2132 ON 03/31/19 JJB Performed at [facility], [address] [place], [place] [address]   Prepare RBC     Status: None   Collection Time: 03/31/19  8:43 PM  Result Value Ref Range   Order Confirmation      ORDER PROCESSED BY BLOOD BANK Performed at [facility], [address] [place], [place] [address]   Blood gas, arterial     Status: Abnormal   Collection Time: 03/31/19 10:13 PM  Result Value Ref Range   FIO2 0.40    pH, Arterial 7.37 7.350 - 7.450   pCO2 arterial 30 (L) 32.0 - 48.0 mmHg   pO2, Arterial 74 (L) 83.0 - 108.0 mmHg   Bicarbonate 17.3 (L) 20.0 - 28.0 mmol/L   Acid-base deficit 6.7 (H) 0.0 - 2.0 mmol/L   O2 Saturation 94.2 %   Patient temperature 37.0    Collection site LEFT RADIAL    Sample type ARTERIAL DRAW    Allens test (pass/fail) PASS PASS    Comment: Performed at [facility], [address] [place], [place] [address]   Ct Head Wo Contrast  Result Date: 03/31/2019 CLINICAL DATA:  Head trauma EXAM: CT HEAD WITHOUT CONTRAST TECHNIQUE: Contiguous axial images were obtained from the base of the skull through the vertex without intravenous contrast. COMPARISON:  None. FINDINGS: Brain: No acute territorial infarction, hemorrhage, or intracranial mass. Atrophy and mild small vessel ischemic changes of the white matter. Slight ventricular prominence felt secondary to atrophy. Vascular: No hyperdense vessels. Scattered calcifications at the carotid siphon Skull: Normal. Negative for fracture or focal lesion. Sinuses/Orbits: No acute finding. Mucosal thickening in the right maxillary  sinus. Other: None IMPRESSION: 1. No CT evidence for acute intracranial abnormality. 2. Atrophy and mild small vessel ischemic changes of the white matter Electronically Signed   By: Donavan Foil M.D.   On: 03/31/2019 20:23   Dg Chest Port 1 View  Result Date: 03/31/2019 CLINICAL DATA:  Weakness EXAM: PORTABLE CHEST 1 VIEW COMPARISON:  CT 12/09/2017 FINDINGS: Mild reticular opacities at the bases. No consolidation or effusion. Borderline heart size. No pneumothorax. IMPRESSION: Mild reticular opacities in the right greater than left lung base, could reflect atypical infection. Borderline cardiomegaly. Electronically Signed   By: Donavan Foil M.D.   On: 03/31/2019 20:40   Dg Knee Complete 4 Views Left  Result Date: 03/31/2019 CLINICAL DATA:  Status post fall. EXAM: LEFT KNEE - COMPLETE 4+ VIEW COMPARISON:  None. FINDINGS: No evidence of fracture, or dislocation. Small suprapatellar joint effusion. Post total left knee arthroplasty with normal alignment  of the orthopedic components and no evidence of loosening. Mild suprapatellar soft tissue swelling. IMPRESSION: 1. No acute fracture or dislocation identified about the left knee. 2. Small suprapatellar joint effusion. Electronically Signed   By: Fidela Salisbury M.D.   On: 03/31/2019 20:40    Pending Labs Unresulted Labs (From admission, onward)    Start     Ordered   04/01/19 0500  Protime-INR  Tomorrow morning,   STAT     03/31/19 2340   04/01/19 0500  Cortisol-am, blood  Tomorrow morning,   STAT     03/31/19 2340   04/01/19 0500  Procalcitonin  Tomorrow morning,   STAT     03/31/19 2340   04/01/19 9381  Basic metabolic panel  Tomorrow morning,   STAT     03/31/19 2340   04/01/19 0500  CBC  Tomorrow morning,   STAT     03/31/19 2340   03/31/19 2341  HIV antibody (Routine Testing)  Once,   STAT     03/31/19 2340   03/31/19 2341  Hemoglobin and hematocrit, blood  Now then every 4 hours,   STAT     03/31/19 2340   03/31/19 2340  Lactic  acid, plasma  STAT Now then every 3 hours,   STAT     03/31/19 2339   03/31/19 2031  Urine culture  Once,   STAT     03/31/19 2030   03/31/19 2029  Lactic acid, plasma  Now then every 2 hours,   STAT     03/31/19 2028   03/31/19 2029  Culture, blood (routine x 2)  BLOOD CULTURE X 2,   STAT     03/31/19 2028   03/31/19 1950  Pathologist smear review  Once,   STAT     03/31/19 1950          Vitals/Pain Today's Vitals   03/31/19 2300 03/31/19 2314 03/31/19 2330 04/01/19 0000  BP: (!) 144/100 136/81 (!) 126/91 136/89  Pulse: (!) 120 (!) 122  (!) 124  Resp: (!) 21 19 18 20   Temp:  98.2 F (36.8 C)    TempSrc:  Oral    SpO2: 98% 97%  99%  Weight:      Height:      PainSc:  6       Isolation Precautions No active isolations  Medications Medications  pantoprazole (PROTONIX) 80 mg in sodium chloride 0.9 % 250 mL (0.32 mg/mL) infusion (8 mg/hr Intravenous New Bag/Given 03/31/19 2154)  pantoprazole (PROTONIX) injection 40 mg (has no administration in time range)  0.9 %  sodium chloride infusion (Manually program via Guardrails IV Fluids) (has no administration in time range)  sodium chloride 0.9 % bolus 1,000 mL (has no administration in time range)  sodium chloride 0.9 % bolus 500 mL (has no administration in time range)  LORazepam (ATIVAN) injection 1 mg (has no administration in time range)  sodium chloride flush (NS) 0.9 % injection 3 mL (has no administration in time range)  0.9 %  sodium chloride infusion (has no administration in time range)  ceFEPIme (MAXIPIME) 2 g in sodium chloride 0.9 % 100 mL IVPB (has no administration in time range)  ondansetron (ZOFRAN) tablet 4 mg (has no administration in time range)    Or  ondansetron (ZOFRAN) injection 4 mg (has no administration in time range)  folic acid injection 1 mg (has no administration in time range)  thiamine (B-1) injection 100 mg (has no administration in time range)  octreotide (SANDOSTATIN) 500 mcg in sodium  chloride 0.9 % 250 mL (2 mcg/mL) infusion (has no administration in time range)  LORazepam (ATIVAN) injection 1-2 mg (has no administration in time range)  ipratropium-albuterol (DUONEB) 0.5-2.5 (3) MG/3ML nebulizer solution 3 mL (has no administration in time range)  potassium chloride 10 mEq in 100 mL IVPB (has no administration in time range)  vancomycin (VANCOCIN) 2,000 mg in sodium chloride 0.9 % 500 mL IVPB (has no administration in time range)  LORazepam (ATIVAN) injection 1 mg (1 mg Intravenous Given 03/31/19 1956)  ondansetron (ZOFRAN) injection 4 mg (4 mg Intravenous Given 03/31/19 1957)  sodium chloride 0.9 % bolus 500 mL (0 mLs Intravenous Stopped 03/31/19 2059)  pantoprazole (PROTONIX) 80 mg in sodium chloride 0.9 % 100 mL IVPB (0 mg Intravenous Stopped 03/31/19 2212)  0.9 %  sodium chloride infusion (Manually program via Guardrails IV Fluids) ( Intravenous New Bag/Given 03/31/19 2142)  cefTRIAXone (ROCEPHIN) 1 g in sodium chloride 0.9 % 100 mL IVPB (0 g Intravenous Stopped 03/31/19 2143)    Mobility walks Low fall risk   Focused Assessments Cardiac Assessment Handoff:  Cardiac Rhythm: Sinus tachycardia Lab Results  Component Value Date   CKTOTAL 1,674 (H) 03/31/2019   TROPONINI 0.04 ([place]) 03/31/2019   No results found for: DDIMER Does the Patient currently have chest pain? No     R Recommendations: See Admitting Provider Note  Report given to:   Additional Notes:

## 2019-04-02 ENCOUNTER — Inpatient Hospital Stay: Payer: Self-pay

## 2019-04-02 DIAGNOSIS — L080 Pyoderma: Secondary | ICD-10-CM

## 2019-04-02 DIAGNOSIS — E872 Acidosis: Secondary | ICD-10-CM

## 2019-04-02 DIAGNOSIS — R7881 Bacteremia: Secondary | ICD-10-CM

## 2019-04-02 DIAGNOSIS — G9340 Encephalopathy, unspecified: Secondary | ICD-10-CM

## 2019-04-02 DIAGNOSIS — D62 Acute posthemorrhagic anemia: Secondary | ICD-10-CM

## 2019-04-02 DIAGNOSIS — I10 Essential (primary) hypertension: Secondary | ICD-10-CM

## 2019-04-02 DIAGNOSIS — F10231 Alcohol dependence with withdrawal delirium: Secondary | ICD-10-CM

## 2019-04-02 DIAGNOSIS — J181 Lobar pneumonia, unspecified organism: Secondary | ICD-10-CM

## 2019-04-02 DIAGNOSIS — E871 Hypo-osmolality and hyponatremia: Secondary | ICD-10-CM

## 2019-04-02 DIAGNOSIS — E7800 Pure hypercholesterolemia, unspecified: Secondary | ICD-10-CM

## 2019-04-02 DIAGNOSIS — Z96653 Presence of artificial knee joint, bilateral: Secondary | ICD-10-CM

## 2019-04-02 DIAGNOSIS — Z8589 Personal history of malignant neoplasm of other organs and systems: Secondary | ICD-10-CM

## 2019-04-02 DIAGNOSIS — W19XXXA Unspecified fall, initial encounter: Secondary | ICD-10-CM

## 2019-04-02 DIAGNOSIS — Z9089 Acquired absence of other organs: Secondary | ICD-10-CM

## 2019-04-02 DIAGNOSIS — N179 Acute kidney failure, unspecified: Secondary | ICD-10-CM

## 2019-04-02 DIAGNOSIS — E876 Hypokalemia: Secondary | ICD-10-CM

## 2019-04-02 DIAGNOSIS — Z9889 Other specified postprocedural states: Secondary | ICD-10-CM

## 2019-04-02 DIAGNOSIS — Z8719 Personal history of other diseases of the digestive system: Secondary | ICD-10-CM

## 2019-04-02 DIAGNOSIS — J690 Pneumonitis due to inhalation of food and vomit: Secondary | ICD-10-CM

## 2019-04-02 DIAGNOSIS — T796XXA Traumatic ischemia of muscle, initial encounter: Secondary | ICD-10-CM

## 2019-04-02 LAB — CBC
HCT: 28.9 % — ABNORMAL LOW (ref 39.0–52.0)
Hemoglobin: 8 g/dL — ABNORMAL LOW (ref 13.0–17.0)
MCH: 22.6 pg — ABNORMAL LOW (ref 26.0–34.0)
MCHC: 27.7 g/dL — ABNORMAL LOW (ref 30.0–36.0)
MCV: 81.6 fL (ref 80.0–100.0)
Platelets: 187 10*3/uL (ref 150–400)
RBC: 3.54 MIL/uL — ABNORMAL LOW (ref 4.22–5.81)
RDW: 25.2 % — ABNORMAL HIGH (ref 11.5–15.5)
WBC: 10.3 10*3/uL (ref 4.0–10.5)
nRBC: 0.9 % — ABNORMAL HIGH (ref 0.0–0.2)

## 2019-04-02 LAB — COMPREHENSIVE METABOLIC PANEL
ALT: 98 U/L — ABNORMAL HIGH (ref 0–44)
AST: 207 U/L — ABNORMAL HIGH (ref 15–41)
Albumin: 2.3 g/dL — ABNORMAL LOW (ref 3.5–5.0)
Alkaline Phosphatase: 166 U/L — ABNORMAL HIGH (ref 38–126)
Anion gap: 10 (ref 5–15)
BUN: 22 mg/dL (ref 8–23)
CO2: 25 mmol/L (ref 22–32)
Calcium: 9.5 mg/dL (ref 8.9–10.3)
Chloride: 105 mmol/L (ref 98–111)
Creatinine, Ser: 0.84 mg/dL (ref 0.61–1.24)
GFR calc Af Amer: >60 mL/min (ref >60)
GFR calc non Af Amer: >60 mL/min (ref >60)
Glucose, Bld: 121 mg/dL — ABNORMAL HIGH (ref 70–99)
Potassium: 3.9 mmol/L (ref 3.5–5.1)
Sodium: 140 mmol/L (ref 135–145)
Total Bilirubin: 2.2 mg/dL — ABNORMAL HIGH (ref 0.3–1.2)
Total Protein: 5 g/dL — ABNORMAL LOW (ref 6.5–8.1)

## 2019-04-02 LAB — TYPE AND SCREEN
ABO/RH(D): B POS
Antibody Screen: NEGATIVE
Unit division: 0
Unit division: 0

## 2019-04-02 LAB — BPAM RBC
Blood Product Expiration Date: 202007182359
Blood Product Expiration Date: 202007182359
ISSUE DATE / TIME: 202006202227
ISSUE DATE / TIME: 202006210433
Unit Type and Rh: 7300
Unit Type and Rh: 7300

## 2019-04-02 LAB — LACTIC ACID, PLASMA: Lactic Acid, Venous: 1.2 mmol/L (ref 0.5–1.9)

## 2019-04-02 LAB — CK: Total CK: 347 U/L (ref 49–397)

## 2019-04-02 LAB — GLUCOSE, CAPILLARY
Glucose-Capillary: 103 mg/dL — ABNORMAL HIGH (ref 70–99)
Glucose-Capillary: 121 mg/dL — ABNORMAL HIGH (ref 70–99)
Glucose-Capillary: 142 mg/dL — ABNORMAL HIGH (ref 70–99)
Glucose-Capillary: 118 mg/dL — ABNORMAL HIGH (ref 70–99)

## 2019-04-02 LAB — URINE CULTURE

## 2019-04-02 LAB — PATHOLOGIST SMEAR REVIEW

## 2019-04-02 MED ORDER — PANTOPRAZOLE SODIUM 40 MG IV SOLR
40.0000 mg | Freq: Two times a day (BID) | INTRAVENOUS | Status: DC
  Administered 2019-04-02 (×2): 40 mg via INTRAVENOUS
  Filled 2019-04-02 (×2): qty 40

## 2019-04-02 MED ORDER — VANCOMYCIN HCL 1.5 G IV SOLR
1500.0000 mg | Freq: Once | INTRAVENOUS | Status: AC
  Administered 2019-04-02: 1500 mg via INTRAVENOUS
  Filled 2019-04-02: qty 1500

## 2019-04-02 MED ORDER — VANCOMYCIN HCL IN DEXTROSE 1-5 GM/200ML-% IV SOLN
1000.0000 mg | Freq: Two times a day (BID) | INTRAVENOUS | Status: DC
  Administered 2019-04-03 – 2019-04-05 (×5): 1000 mg via INTRAVENOUS
  Filled 2019-04-02 (×8): qty 200

## 2019-04-02 MED ORDER — LORAZEPAM 2 MG/ML IJ SOLN
1.0000 mg | INTRAMUSCULAR | Status: DC | PRN
  Administered 2019-04-02: 1 mg via INTRAVENOUS
  Filled 2019-04-02: qty 1

## 2019-04-02 MED ORDER — DEXTROSE IN LACTATED RINGERS 5 % IV SOLN
INTRAVENOUS | Status: DC
  Administered 2019-04-02: 50 mL/h via INTRAVENOUS

## 2019-04-02 MED ORDER — VANCOMYCIN HCL 10 G IV SOLR
1500.0000 mg | Freq: Once | INTRAVENOUS | Status: DC
  Filled 2019-04-02: qty 1500

## 2019-04-02 NOTE — Progress Notes (Signed)
[place] [facility] [place] NAME: George Wade    MR#:  [id number]  DATE OF BIRTH:  14-Jan-1956  SUBJECTIVE:   Patient is confused today.  Appears sleepy, but arousable to voice.  No concerns.  REVIEW OF SYSTEMS:  ROS-difficult to obtain, as patient is sleepy  DRUG ALLERGIES:  No Known Allergies VITALS:  Blood pressure 134/85, pulse (!) 55, temperature 97.8 F (36.6 C), temperature source Axillary, resp. rate 14, height 5\' 9"  (1.753 m), weight 78 kg, SpO2 (!) 89 %. PHYSICAL EXAMINATION:  Physical Exam  GENERAL:  Laying in the bed with no acute distress, mildly disheveled appearing. HEENT: Head atraumatic, normocephalic. Pupils equal, round, reactive to light and accommodation. No scleral icterus. Extraocular muscles intact. Oropharynx and nasopharynx clear. + NG tube in place. NECK:  Supple, no jugular venous distention. No thyroid enlargement. LUNGS: Lungs are clear to auscultation bilaterally. No wheezes, crackles, rhonchi. No use of accessory muscles of respiration.  CARDIOVASCULAR: Bradycardic, regular rhythm, S1, S2 normal. No murmurs, rubs, or gallops.  ABDOMEN: Soft, nontender, nondistended. Bowel sounds present.  EXTREMITIES: No pedal edema, cyanosis, or clubbing.  NEUROLOGIC: CN 2-12 intact, no focal deficits. 5/5 muscle strength throughout all extremities. Sensation intact throughout. Gait not checked.  PSYCHIATRIC: The patient is alert and oriented x 3.  SKIN: No obvious rash, lesion, or ulcer. Multiple ecchymoses present. LABORATORY PANEL:  Male CBC Recent Labs  Lab 04/02/19 0556  WBC 10.3  HGB 8.0*  HCT 28.9*  PLT 187   ------------------------------------------------------------------------------------------------------------------ Chemistries  Recent Labs  Lab 03/31/19 1950  04/02/19 0556  NA 128*   < > 140  K 3.2*   < > 3.9  CL 78*   < > 105  CO2 12*   < > 25  GLUCOSE 82   < > 121*  BUN 17   < > 22   CREATININE 1.54*   < > 0.84  CALCIUM 13.4*   < > 9.5  MG 2.2  --   --   AST 213*  --  207*  ALT 98*  --  98*  ALKPHOS 280*  --  166*  BILITOT 1.9*  --  2.2*   < > = values in this interval not displayed.   RADIOLOGY:  Dg Chest Port 1 View  Result Date: 04/02/2019 CLINICAL DATA:  Respiratory failure.  History of lung cancer. EXAM: PORTABLE CHEST 1 VIEW COMPARISON:  04/01/2019.  03/31/2019. FINDINGS: NG tube in stable position. Stable cardiomegaly. Persistent progressed bilateral pulmonary infiltrates/edema again noted. Interim bibasilar atelectasis. No pleural effusion or pneumothorax. IMPRESSION: 1.  NG tube stable position. 2.  Stable cardiomegaly 3. Persistent progressive bilateral pulmonary infiltrates/edema noted. Bibasilar atelectasis. Electronically Signed   By: Marcello Moores  Register   On: 04/02/2019 07:23   ASSESSMENT AND PLAN:   GI bleed- likely due to PUD. -GI following- will likely need EGD prior to discharge, waiting for patient to become more stable -Continue Protonix IV bid -Plan to remove NG tube today -Octreotide drip stopped today  Acute blood loss anemia- hemoglobin stable s/p 2 units PRBC -Serial H/H -Transfuse for hemoglobin < 7  Coag-negative Staph bacteremia -Continue vancomycin and unasyn  Aspiration pneumonia -Trend procalcitonin -Continue IV antibiotics -Blood cultures pending  Acute rhabdomyolysis- resolved with IVFs -IV fluids  Alcohol abuse- LFTs elevated -CIWA protocol  -IV thiamine and folic acid -Alcohol cessation counseling performed this admission   DVT prophylaxis- SCDs.  All the records are reviewed and case discussed with  Care Management/Social Worker. Management plans discussed with the patient, family and they are in agreement.  CODE STATUS: Full Code  TOTAL TIME TAKING CARE OF THIS PATIENT: 35 minutes.   More than 50% of the time was spent in counseling/coordination of care: YES  POSSIBLE D/C IN 3-4 DAYS, DEPENDING ON CLINICAL  CONDITION.   [name] Mayo M.D on 04/02/2019 at 2:51 PM  Between 7am to 6pm - Pager - [phone]  After 6pm go to [email or web address] - Proofreader  [employer] [facility]  Office  [phone]  CC: Primary care physician; Patient, [name] Per  Note: This dictation was prepared with Dragon dictation along with smaller phrase technology. Any transcriptional errors that result from this process are unintentional.

## 2019-04-02 NOTE — Progress Notes (Signed)
He remains encephalopathic/delirious.  He remains on dexmedetomidine infusion.  Upon my initial evaluation this morning he had just received lorazepam and was significantly somnolent.  There was no respiratory distress.  He was not able to answer questions for me.  NG tube was in place without evidence of gastric bleeding.  He has been hemodynamically stable and hemoglobin has remained stable  Vitals:   04/02/19 0800 04/02/19 0900 04/02/19 1000 04/02/19 1100  BP: (!) 132/93 122/80 134/82 134/85  Pulse: 65 (!) 54 (!) 54 (!) 55  Resp: (!) 21 14 12 14   Temp: 97.8 F (36.6 C)     TempSrc: Axillary     SpO2: 95% (!) 88% 100% (!) 89%  Weight:      Height:      4 LPM   Gen: Disheveled, somnolent, no overt respiratory distress HEENT: NCAT, sclericterus Neck: No JVD noted Lungs: breath sounds full, bilateral rhonchi, no wheezes Cardiovascular: Cardiac, regular, no M noted Abdomen: Soft, nontender, normal BS Ext: No clubbing, cyanosis, edema Neuro: Encephalopathic, no focal findings Skin: Limited exam, no lesions noted  BMP Latest Ref Rng & Units 04/02/2019 04/01/2019 03/31/2019  Glucose 70 - 99 mg/dL 121(H) 120(H) 82  BUN 8 - 23 mg/dL 22 19 17   Creatinine 0.61 - 1.24 mg/dL 0.84 1.02 1.54(H)  Sodium 135 - 145 mmol/L 140 128(L) 128(L)  Potassium 3.5 - 5.1 mmol/L 3.9 3.5 3.2(L)  Chloride 98 - 111 mmol/L 105 87(L) 78(L)  CO2 22 - 32 mmol/L 25 27 12(L)  Calcium 8.9 - 10.3 mg/dL 9.5 10.5(H) 13.4(HH)   Hepatic Function Latest Ref Rng & Units 04/02/2019 03/31/2019 12/09/2017  Total Protein 6.5 - 8.1 g/dL 5.0(L) 6.3(L) 6.6  Albumin 3.5 - 5.0 g/dL 2.3(L) 2.9(L) 3.9  AST 15 - 41 U/L 207(H) 213(H) 97(H)  ALT 0 - 44 U/L 98(H) 98(H) 89(H)  Alk Phosphatase 38 - 126 U/L 166(H) 280(H) 61  Total Bilirubin 0.3 - 1.2 mg/dL 2.2(H) 1.9(H) 1.6(H)   CBC Latest Ref Rng & Units 04/02/2019 04/01/2019 04/01/2019  WBC 4.0 - 10.5 K/uL 10.3 - -  Hemoglobin 13.0 - 17.0 g/dL 8.0(L) 7.2(L) 7.6(L)  Hematocrit 39.0 - 52.0 %  28.9(L) 25.2(L) 26.3(L)  Platelets 150 - 400 K/uL 187 - -   CXR: Increasing RLL opacity  IMPRESSION:  Acute UGIB -appears to be no longer actively bleeding RLL aspiration pneumonia Acute hypoxemic respiratory failure Acute blood loss anemia without active bleeding presently Alcohol abuse with withdrawal syndrome/DTs Lactic acidosis, resolved Rhabdomyolysis, resolved AKI, resolved Hypokalemia, resolved Mildly elevated LFTs  The major problem requiring persistent ICU/SDU level of care is alcohol withdrawal syndrome  PLAN/REC: Continue ICU/SDU level of care Continue supplemental oxygen to maintain SPO2 >90% Continue current antibiotics (ampicillin-sulbactam) Continue ICU hemodynamic monitoring Monitor BMET intermittently Monitor I/Os Correct electrolytes as indicated Maintenance IVFs ordered as patient is NPO Gastroenterology following Discontinue NGT, octreotide infusion Change pantoprazole infusion to pantoprazole 40 mg IV every 12 hours Continue dexmedetomidine as needed for agitated delirium Continue lorazepam as needed for alcohol withdrawal syndrome.  Dosage adjusted 6/22  Merton Border, MD PCCM service Mobile [phone] Pager [phone] 04/02/2019 12:39 PM

## 2019-04-02 NOTE — Progress Notes (Signed)
Pharmacy Antibiotic Note  George Wade is a 63 y.o. male admitted on 03/31/2019 with aspiration pneumonia and now with CoNS bacteremia.  Pharmacy has been consulted for Unasyn and vancomycin dosing. Since admission his Scr is back to baseline and leukocytosis has resolved but he remains encephalopathic with 3/4 positive blood cultures  Plan: 1) continue Unasyn 3g IV q8h  2) Vancomycin 1000 mg IV Q 12 hrs following a 1500 mg loading dose Goal AUC 400-550. Expected AUC: 445 SCr used: 0.84 mg/dL T1/2:  8.7h  Height: 5\' 9"  (175.3 cm) Weight: 172 lb (78 kg) IBW/kg (Calculated) : 70.7  Temp (24hrs), Avg:97.9 F (36.6 C), Min:97.5 F (36.4 C), Max:98.2 F (36.8 C)  Recent Labs  Lab 03/31/19 1950 03/31/19 2043 04/01/19 0302 04/02/19 0556  WBC 23.0*  --  15.4* 10.3  CREATININE 1.54*  --  1.02 0.84  LATICACIDVEN  --  >11.0* 5.2* 1.2    Estimated Creatinine Clearance: 91.2 mL/min (by C-G formula based on SCr of 0.84 mg/dL).    Microbiology 6/20 BCx 3/4 S epidermidis 6/21 MRSA PCR (-) 6/20 UCx recollect 6/20 SARS CoV-2 (-)  Antibiotics this Admission Unasyn 6/21 >> Vancomycin 6/22 >>  Thank you for allowing pharmacy to be a part of this patient's care.  Vallery Sa, PharmD Clinical Pharmacist 04/02/2019 8:34 AM

## 2019-04-02 NOTE — Progress Notes (Signed)
George Lame, MD [facility]   [address], [place], [place] [address] Phone: [phone] Fax : [phone]   Subjective: The patient is sleeping comfortably and hard to arouse.  I was told that the patient had been given sedation due to agitation.  The patient's hemoglobin is stable without any signs of any further GI bleeding.  The patient has an NG tube that did not show any aspiration of blood.   Objective: Vital signs in last 24 hours: Vitals:   04/02/19 0800 04/02/19 0900 04/02/19 1000 04/02/19 1100  BP: (!) 132/93 122/80 134/82 134/85  Pulse: 65 (!) 54 (!) 54 (!) 55  Resp: (!) 21 14 12 14   Temp: 97.8 F (36.6 C)     TempSrc: Axillary     SpO2: 95% (!) 88% 100% (!) 89%  Weight:      Height:       Weight change:   Intake/Output Summary (Last 24 hours) at 04/02/2019 1341 Last data filed at 04/02/2019 1040 Gross per 24 hour  Intake 2226.24 ml  Output 1525 ml  Net 701.24 ml     Exam: Heart:: Regular rate and rhythm, S1S2 present or without murmur or extra heart sounds Lungs: normal and clear to auscultation and percussion Abdomen: soft, nontender, normal bowel sounds   Lab Results: @LABTEST2 @ Micro Results: Recent Results (from the past 240 hour(s))  SARS Coronavirus 2 (CEPHEID - Performed in [place] hospital lab), Hosp Order     Status: None   Collection Time: 03/31/19  8:43 PM   Specimen: Nasopharyngeal Swab  Result Value Ref Range Status   SARS Coronavirus 2 NEGATIVE NEGATIVE Final    Comment: (NOTE) If result is NEGATIVE SARS-CoV-2 target nucleic acids are NOT DETECTED. The SARS-CoV-2 RNA is generally detectable in upper and lower  respiratory specimens during the acute phase of infection. The lowest  concentration of SARS-CoV-2 viral copies this assay can detect is 250  copies / mL. A negative result does not preclude SARS-CoV-2 infection  and should not be used as the sole basis for treatment or other  patient management decisions.  A negative  result may occur with  improper specimen collection / handling, submission of specimen other  than nasopharyngeal swab, presence of viral mutation(s) within the  areas targeted by this assay, and inadequate number of viral copies  (<250 copies / mL). A negative result must be combined with clinical  observations, patient history, and epidemiological information. If result is POSITIVE SARS-CoV-2 target nucleic acids are DETECTED. The SARS-CoV-2 RNA is generally detectable in upper and lower  respiratory specimens dur ing the acute phase of infection.  Positive  results are indicative of active infection with SARS-CoV-2.  Clinical  correlation with patient history and other diagnostic information is  necessary to determine patient infection status.  Positive results do  not rule out bacterial infection or co-infection with other viruses. If result is PRESUMPTIVE POSTIVE SARS-CoV-2 nucleic acids MAY BE PRESENT.   A presumptive positive result was obtained on the submitted specimen  and confirmed on repeat testing.  While 2019 novel coronavirus  (SARS-CoV-2) nucleic acids may be present in the submitted sample  additional confirmatory testing may be necessary for epidemiological  and / or clinical management purposes  to differentiate between  SARS-CoV-2 and other Sarbecovirus currently known to infect humans.  If clinically indicated additional testing with an alternate test  methodology [phone]) is advised. The SARS-CoV-2 RNA is generally  detectable in upper and lower respiratory sp ecimens  during the acute  phase of infection. The expected result is Negative. Fact Sheet for Patients:  StrictlyIdeas.no Fact Sheet for Healthcare Providers: BankingDealers.co.za This test is not yet approved or cleared by the [place] FDA and has been authorized for detection and/or diagnosis of SARS-CoV-2 by FDA under an Emergency Use Authorization  (EUA).  This EUA will remain in effect (meaning this test can be used) for the duration of the COVID-19 declaration under Section 564(b)(1) of the Act, 21 U.S.C. section 360bbb-3(b)(1), unless the authorization is terminated or revoked sooner. Performed at [facility], [address] [place], [place] [address]   Culture, blood (routine x 2)     Status: Abnormal (Preliminary result)   Collection Time: 03/31/19  8:43 PM   Specimen: BLOOD  Result Value Ref Range Status   Specimen Description   Final    BLOOD LEFT ANTECUBITAL Performed at [facility], [address] [place], [place] [address]    Special Requests   Final    BOTTLES DRAWN AEROBIC AND ANAEROBIC Blood Culture adequate volume Performed at [facility], [address], [place], [place] [address]    Culture  Setup Time   Final    GRAM POSITIVE COCCI AEROBIC BOTTLE ONLY CRITICAL VALUE NOTED.  VALUE IS CONSISTENT WITH PREVIOUSLY REPORTED AND CALLED VALUE. [place] Performed at [facility], [place] [address] [place], [place] [address]    Culture STAPHYLOCOCCUS EPIDERMIDIS (A)  Final   Report Status PENDING  Incomplete  Culture, blood (routine x 2)     Status: Abnormal (Preliminary result)   Collection Time: 03/31/19  8:43 PM   Specimen: BLOOD  Result Value Ref Range Status   Specimen Description   Final    BLOOD RIGHT ANTECUBITAL Performed at [facility], [address] [place], [place] [address]    Special Requests   Final    BOTTLES DRAWN AEROBIC AND ANAEROBIC Blood Culture adequate volume Performed at [facility], [address] [place], [place] [address]    Culture  Setup Time   Final    Organism ID to follow GRAM POSITIVE COCCI IN BOTH AEROBIC AND ANAEROBIC BOTTLES CRITICAL RESULT CALLED TO, READ BACK BY AND VERIFIED WITH: MYRA SLAUGHTER ON 04/01/2019 AT 1344 QSD GRAM STAIN REVIEWED-AGREE WITH RESULT T. TYSOR  Performed at [facility], [place] [address], [place], [place] [address]    Culture STAPHYLOCOCCUS EPIDERMIDIS (A)  Final   Report Status PENDING  Incomplete  Blood Culture ID Panel (Reflexed)     Status: Abnormal   Collection Time: 03/31/19  8:43 PM  Result Value Ref Range Status   Enterococcus species NOT DETECTED NOT DETECTED Final   Listeria monocytogenes NOT DETECTED NOT DETECTED Final   Staphylococcus species DETECTED (A) NOT DETECTED Final    Comment: Methicillin (oxacillin) resistant coagulase negative staphylococcus. Possible blood culture contaminant (unless isolated from more than one blood culture draw or clinical case suggests pathogenicity). No antibiotic treatment is indicated for blood  culture contaminants. CRITICAL RESULT CALLED TO, READ BACK BY AND VERIFIED WITH: MYRA SLAUGHTER ON 04/01/2019 AT 1344 QSD    Staphylococcus aureus (BCID) NOT DETECTED NOT DETECTED Final   Methicillin resistance DETECTED (A) NOT DETECTED Final    Comment: CRITICAL RESULT CALLED TO, READ BACK BY AND VERIFIED WITH: MYRA SLAUGHTER ON 04/01/2019 AT 1344 QSD    Streptococcus species NOT DETECTED NOT DETECTED Final   Streptococcus agalactiae NOT DETECTED NOT DETECTED  Final   Streptococcus pneumoniae NOT DETECTED NOT DETECTED Final   Streptococcus pyogenes NOT DETECTED NOT DETECTED Final   Acinetobacter baumannii NOT DETECTED NOT DETECTED Final   Enterobacteriaceae species NOT DETECTED NOT DETECTED Final   Enterobacter cloacae complex NOT DETECTED NOT DETECTED Final   Escherichia coli NOT DETECTED NOT DETECTED Final   Klebsiella oxytoca NOT DETECTED NOT DETECTED Final   Klebsiella pneumoniae NOT DETECTED NOT DETECTED Final   Proteus species NOT DETECTED NOT DETECTED Final   Serratia marcescens NOT DETECTED NOT DETECTED Final   Haemophilus influenzae NOT DETECTED NOT DETECTED Final   Neisseria meningitidis NOT DETECTED NOT DETECTED Final   Pseudomonas aeruginosa NOT DETECTED NOT DETECTED  Final   Candida albicans NOT DETECTED NOT DETECTED Final   Candida glabrata NOT DETECTED NOT DETECTED Final   Candida krusei NOT DETECTED NOT DETECTED Final   Candida parapsilosis NOT DETECTED NOT DETECTED Final   Candida tropicalis NOT DETECTED NOT DETECTED Final    Comment: Performed at [facility], [address] [place], [place] [address]  Urine culture     Status: Abnormal   Collection Time: 03/31/19 10:13 PM   Specimen: Urine, Random  Result Value Ref Range Status   Specimen Description   Final    URINE, RANDOM Performed at [facility], [address], [place], [place] [address]    Special Requests   Final    NONE Performed at [facility], [address] [place], [place] [address]    Culture MULTIPLE SPECIES PRESENT, SUGGEST RECOLLECTION (A)  Final   Report Status 04/02/2019 FINAL  Final  MRSA PCR Screening     Status: None   Collection Time: 04/01/19  1:54 AM   Specimen: Nasopharyngeal  Result Value Ref Range Status   MRSA by PCR NEGATIVE NEGATIVE Final    Comment:        The GeneXpert MRSA Assay (FDA approved for NASAL specimens only), is one component of a comprehensive MRSA colonization surveillance program. It is not intended to diagnose MRSA infection nor to guide or monitor treatment for MRSA infections. Performed at [facility], [address] [place], [place] [address]    Studies/Results: Dg Abd 1 View  Result Date: 04/01/2019 CLINICAL DATA:  NG tube placement. EXAM: ABDOMEN - 1 VIEW COMPARISON:  None. FINDINGS: Tip and side port of the enteric tube below the diaphragm in the stomach. High-density material in the colon suggestive of enteric contrast. No bowel dilatation in the upper abdomen. IMPRESSION: Tip and side port of the enteric tube below the diaphragm in the stomach. Electronically Signed   By: Keith Rake M.D.   On: 04/01/2019 03:53   Ct Head Wo Contrast  Result Date: 03/31/2019 CLINICAL DATA:   Head trauma EXAM: CT HEAD WITHOUT CONTRAST TECHNIQUE: Contiguous axial images were obtained from the base of the skull through the vertex without intravenous contrast. COMPARISON:  None. FINDINGS: Brain: No acute territorial infarction, hemorrhage, or intracranial mass. Atrophy and mild small vessel ischemic changes of the white matter. Slight ventricular prominence felt secondary to atrophy. Vascular: No hyperdense vessels. Scattered calcifications at the carotid siphon Skull: Normal. Negative for fracture or focal lesion. Sinuses/Orbits: No acute finding. Mucosal thickening in the right maxillary sinus. Other: None IMPRESSION: 1. No CT evidence for acute intracranial abnormality. 2. Atrophy and mild small vessel ischemic changes of the white matter Electronically Signed   By: Donavan Foil M.D.   On: 03/31/2019 20:23   Dg Chest Port 1  View  Result Date: 04/02/2019 CLINICAL DATA:  Respiratory failure.  History of lung cancer. EXAM: PORTABLE CHEST 1 VIEW COMPARISON:  04/01/2019.  03/31/2019. FINDINGS: NG tube in stable position. Stable cardiomegaly. Persistent progressed bilateral pulmonary infiltrates/edema again noted. Interim bibasilar atelectasis. No pleural effusion or pneumothorax. IMPRESSION: 1.  NG tube stable position. 2.  Stable cardiomegaly 3. Persistent progressive bilateral pulmonary infiltrates/edema noted. Bibasilar atelectasis. Electronically Signed   By: Marcello Moores  Register   On: 04/02/2019 07:23   Portable Chest 1 View  Result Date: 04/01/2019 CLINICAL DATA:  Dyspnea. EXAM: PORTABLE CHEST 1 VIEW COMPARISON:  Radiographs yesterday. FINDINGS: Enteric tube in place with tip and side-port below the diaphragm. Slight progression in heterogeneous bilateral bibasilar opacities from prior exam. Borderline cardiomegaly is again seen. Low lung volumes. No pleural effusion or pneumothorax. IMPRESSION: 1. Slight progression in bibasilar heterogeneous opacities from prior exam, may be aspiration or  pneumonia. 2. Lower lung volumes from prior. Electronically Signed   By: Keith Rake M.D.   On: 04/01/2019 03:55   Dg Chest Port 1 View  Result Date: 03/31/2019 CLINICAL DATA:  Weakness EXAM: PORTABLE CHEST 1 VIEW COMPARISON:  CT 12/09/2017 FINDINGS: Mild reticular opacities at the bases. No consolidation or effusion. Borderline heart size. No pneumothorax. IMPRESSION: Mild reticular opacities in the right greater than left lung base, could reflect atypical infection. Borderline cardiomegaly. Electronically Signed   By: Donavan Foil M.D.   On: 03/31/2019 20:40   Dg Knee Complete 4 Views Left  Result Date: 03/31/2019 CLINICAL DATA:  Status post fall. EXAM: LEFT KNEE - COMPLETE 4+ VIEW COMPARISON:  None. FINDINGS: No evidence of fracture, or dislocation. Small suprapatellar joint effusion. Post total left knee arthroplasty with normal alignment of the orthopedic components and no evidence of loosening. Mild suprapatellar soft tissue swelling. IMPRESSION: 1. No acute fracture or dislocation identified about the left knee. 2. Small suprapatellar joint effusion. Electronically Signed   By: Fidela Salisbury M.D.   On: 03/31/2019 20:40   Medications: I have reviewed the patient's current medications. Scheduled Meds: . Chlorhexidine Gluconate Cloth  6 each Topical Q0600  . folic acid  1 mg Intravenous Daily  . mouth rinse  15 mL Mouth Rinse BID  . pantoprazole  40 mg Intravenous Q12H  . sodium chloride flush  3 mL Intravenous Q12H   Continuous Infusions: . ampicillin-sulbactam (UNASYN) IV Stopped (04/02/19 0856)  . dexmedetomidine (PRECEDEX) IV infusion 0.4 mcg/kg/hr (04/02/19 1040)  . dextrose 5% lactated ringers 50 mL/hr at 04/02/19 1040  . sodium chloride    . sodium chloride    . thiamine injection 96 mL/hr at 04/02/19 1040   PRN Meds:.ipratropium-albuterol, LORazepam, [DISCONTINUED] ondansetron **OR** ondansetron (ZOFRAN) IV   Assessment: Active Problems:   GI bleed    Plan:  The patient is stable at this point.  Without any further sign of bleeding I agree with taking the NG tube out.  The patients is still not an optimal condition to undergo any elective procedures.  If the patient should start bleeding again we can then consider an urgent EGD.   LOS: 2 days   George Wade 04/02/2019, 1:41 PM

## 2019-04-02 NOTE — Consult Note (Signed)
NAME: George Wade  DOB: May 27, 1956  MRN: [id number]  Date/Time: 04/02/2019 2:20 PM  REQUESTING PROVIDER:gonzales Subjective:  REASON FOR CONSULT: staph epidermidis bacteremia ?No History is available from patient- chart reviewed George Wade is a 63 y.o. 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  He is also had diarrhea for 1 month and weakness.  Patient has had nausea and vomiting for 2 days.  Stools have been black and tarry at times with coffee-ground emesis according to patient.  He also has abdominal pain.  He continues to drink at least 1 L of wine a day and he drank up to 3 L a day in the past.  He denies chest pain or shortness of breath. In the ED he had a WBC of 22, CK of 1674, hemoglobin of 6.3, lactate of 11.  Blood cultures were sent.He was given blood transfusion 2 units.   I am asked to see the patient for blood cultures being positive for staph epidermidis. In Feb 2020 he was in [place] [facility]  In November 2019 he was in [place] with altered mental status due to EtOH withdrawal,  alcoholic ketoacidosis and hepatic encephalopathy versus Wernicke's encephalopathy. Past Medical History:  Diagnosis Date  . ED (erectile dysfunction)   . Hypercholesteremia   . Hypertension   . Lung cancer [facility])   Multiple fractures of the left ribs,  Hepatic encephalopathy, alcohol withdrawal syndrome with delirium GI bleed with EGD performed on 09/29/2018 shows severe ulcerating esophagitis of the entire mid and distal esophagus. Past Surgical History:  Procedure Laterality Date  . HERNIA REPAIR  1958  . REPLACEMENT TOTAL KNEE BILATERAL  2009  . TONSILLECTOMY  2008    Social  History   Socioeconomic History  . Marital status: Married    Spouse name: Not on file  . Number of children: Not on file  . Years of education: Not on file  . Highest education level: Not on file  Occupational History  . Not on file  Social Needs  . Financial resource strain: Not on file  . Food insecurity    Worry: Not on file    Inability: Not on file  . Transportation needs    Medical: Not on file    Non-medical: Not on file  Tobacco Use  . Smoking status: Never Smoker  . Smokeless tobacco: Never Used  Substance and Sexual Activity  . Alcohol use: No    Frequency: Never    Comment: Quit 2 days ago  . Drug use: No  . Sexual activity: Not on file  Lifestyle  . Physical activity    Days per week: Not on file    Minutes per session: Not on file  . Stress: Not on file  Relationships  . Social Herbalist on phone: Not on file    Gets together: Not on file    Attends religious service: Not on file    Active member of club or organization: Not on file    Attends meetings of clubs or organizations: Not on file    Relationship status: Not on file  . Intimate partner violence    Fear of current or ex partner: Not on  file    Emotionally abused: Not on file    Physically abused: Not on file    Forced sexual activity: Not on file  Other Topics Concern  . Not on file  Social History Narrative  . Not on file    Family History  Problem Relation Age of Onset  . Cancer Mother   . Hypertension Father   . Cancer Paternal Grandmother    No Known Allergies  ? Current Facility-Administered Medications  Medication Dose Route Frequency Provider Last Rate Last Dose  . Ampicillin-Sulbactam (UNASYN) 3 g in sodium chloride 0.9 % 100 mL IVPB  3 g Intravenous Q6H Charlett Nose, RPH   Stopped at 04/02/19 4235  . Chlorhexidine Gluconate Cloth 2 % PADS 6 each  6 each Topical Q0600 Tukov-Yual, Magdalene S, NP   6 each at 04/02/19 0600  . dexmedetomidine (PRECEDEX) 400  MCG/100ML (4 mcg/mL) infusion  0.4-1.2 mcg/kg/hr Intravenous Titrated Wilhelmina Mcardle, MD 7.8 mL/hr at 04/02/19 1357 0.4 mcg/kg/hr at 04/02/19 1357  . dextrose 5 % in lactated ringers infusion   Intravenous Continuous Wilhelmina Mcardle, MD 50 mL/hr at 04/02/19 1040    . folic acid injection 1 mg  1 mg Intravenous Daily Seals, Theo Dills, NP   1 mg at 04/02/19 0954  . ipratropium-albuterol (DUONEB) 0.5-2.5 (3) MG/3ML nebulizer solution 3 mL  3 mL Nebulization Q6H PRN Seals, Levada Dy H, NP      . LORazepam (ATIVAN) injection 1 mg  1 mg Intravenous Q2H PRN Wilhelmina Mcardle, MD      . MEDLINE mouth rinse  15 mL Mouth Rinse BID Wilhelmina Mcardle, MD   15 mL at 04/02/19 1011  . ondansetron (ZOFRAN) injection 4 mg  4 mg Intravenous Q6H PRN Seals, Angela H, NP      . pantoprazole (PROTONIX) injection 40 mg  40 mg Intravenous Q12H Wilhelmina Mcardle, MD   40 mg at 04/02/19 0954  . sodium chloride 0.9 % bolus 1,000 mL  1,000 mL Intravenous Once Schuyler Amor, MD      . sodium chloride 0.9 % bolus 500 mL  500 mL Intravenous Once Schuyler Amor, MD      . sodium chloride flush (NS) 0.9 % injection 3 mL  3 mL Intravenous Q12H Seals, Angela H, NP   3 mL at 04/02/19 1011  . thiamine 500mg  in normal saline (58ml) IVPB  500 mg Intravenous Daily Wilhelmina Mcardle, MD 96 mL/hr at 04/02/19 1040    . vancomycin (VANCOCIN) 1,500 mg in sodium chloride 0.9 % 500 mL IVPB  1,500 mg Intravenous Once Tyler Pita, MD      . Derrill Memo ON 04/03/2019] vancomycin (VANCOCIN) IVPB 1000 mg/200 mL premix  1,000 mg Intravenous Q12H Dallie Piles, RPH         Abtx:  Anti-infectives (From admission, onward)   Start     Dose/Rate Route Frequency Ordered Stop   04/03/19 0000  vancomycin (VANCOCIN) IVPB 1000 mg/200 mL premix     1,000 mg 200 mL/hr over 60 Minutes Intravenous Every 12 hours 04/02/19 1405     04/02/19 1430  vancomycin (VANCOCIN) 1,500 mg in sodium chloride 0.9 % 500 mL IVPB  Status:  Discontinued     1,500 mg 250  mL/hr over 120 Minutes Intravenous  Once 04/02/19 1353 04/02/19 1356   04/02/19 1400  vancomycin (VANCOCIN) 1,500 mg in sodium chloride 0.9 % 500 mL IVPB     1,500 mg 250 mL/hr  over 120 Minutes Intravenous  Once 04/02/19 1356     04/01/19 1500  Ampicillin-Sulbactam (UNASYN) 3 g in sodium chloride 0.9 % 100 mL IVPB     3 g 200 mL/hr over 30 Minutes Intravenous Every 6 hours 04/01/19 1107     04/01/19 1000  ceFEPIme (MAXIPIME) 2 g in sodium chloride 0.9 % 100 mL IVPB  Status:  Discontinued     2 g 200 mL/hr over 30 Minutes Intravenous Every 12 hours 03/31/19 2340 04/01/19 0106   04/01/19 0130  Ampicillin-Sulbactam (UNASYN) 3 g in sodium chloride 0.9 % 100 mL IVPB  Status:  Discontinued     3 g 200 mL/hr over 30 Minutes Intravenous Every 8 hours 04/01/19 0120 04/01/19 1107   04/01/19 0000  vancomycin (VANCOCIN) 2,000 mg in sodium chloride 0.9 % 500 mL IVPB  Status:  Discontinued     2,000 mg 250 mL/hr over 120 Minutes Intravenous  Once 03/31/19 2355 04/01/19 0106   03/31/19 2345  vancomycin (VANCOCIN) IVPB 1000 mg/200 mL premix  Status:  Discontinued     1,000 mg 200 mL/hr over 60 Minutes Intravenous  Once 03/31/19 2340 03/31/19 2355   03/31/19 2045  cefTRIAXone (ROCEPHIN) 1 g in sodium chloride 0.9 % 100 mL IVPB     1 g 200 mL/hr over 30 Minutes Intravenous  Once 03/31/19 2039 03/31/19 2143      REVIEW OF SYSTEMS:  NA Objective:  VITALS:  BP 134/85   Pulse (!) 55   Temp 97.8 F (36.6 C) (Axillary)   Resp 14   Ht 5\' 9"  (1.753 m)   Wt 78 kg   SpO2 (!) 89%   BMI 25.40 kg/m  PHYSICAL EXAM:  General: obtunded on precedex  Head: Normocephalic, without obvious abnormality, atraumatic. Eyes: PERLA ENT cannot examine Cannot examine oral cavity Neck Rt parotid/upper neck area   swollen,  Back: cannot examine Lungs: b/l air entry- crepts Heart: Regular rate and rhythm, no murmur, rub or gallop. Abdomen: Soft, non-tender,not distended. Bowel sounds normal. No masses Extremities:  b/l knee scar, bruises over the knees      Skin: as above Lymph: Cervical, supraclavicular normal. Neurologic: cannot examine Pertinent Labs Lab Results CBC    Component Value Date/Time   WBC 10.3 04/02/2019 0556   RBC 3.54 (L) 04/02/2019 0556   HGB 8.0 (L) 04/02/2019 0556   HCT 28.9 (L) 04/02/2019 0556   PLT 187 04/02/2019 0556   MCV 81.6 04/02/2019 0556   MCH 22.6 (L) 04/02/2019 0556   MCHC 27.7 (L) 04/02/2019 0556   RDW 25.2 (H) 04/02/2019 0556   LYMPHSABS 0.2 (L) 03/31/2019 1950   MONOABS 0.7 03/31/2019 1950   EOSABS 0.0 03/31/2019 1950   BASOSABS 0.0 03/31/2019 1950    CMP Latest Ref Rng & Units 04/02/2019 04/01/2019 03/31/2019  Glucose 70 - 99 mg/dL 121(H) 120(H) 82  BUN 8 - 23 mg/dL 22 19 17   Creatinine 0.61 - 1.24 mg/dL 0.84 1.02 1.54(H)  Sodium 135 - 145 mmol/L 140 128(L) 128(L)  Potassium 3.5 - 5.1 mmol/L 3.9 3.5 3.2(L)  Chloride 98 - 111 mmol/L 105 87(L) 78(L)  CO2 22 - 32 mmol/L 25 27 12(L)  Calcium 8.9 - 10.3 mg/dL 9.5 10.5(H) 13.4(HH)  Total Protein 6.5 - 8.1 g/dL 5.0(L) - 6.3(L)  Total Bilirubin 0.3 - 1.2 mg/dL 2.2(H) - 1.9(H)  Alkaline Phos 38 - 126 U/L 166(H) - 280(H)  AST 15 - 41 U/L 207(H) - 213(H)  ALT 0 - 44 U/L 98(H) -  98(H)      Microbiology: Recent Results (from the past 240 hour(s))  SARS Coronavirus 2 (CEPHEID - Performed in [place] hospital lab), Hosp Order     Status: None   Collection Time: 03/31/19  8:43 PM   Specimen: Nasopharyngeal Swab  Result Value Ref Range Status   SARS Coronavirus 2 NEGATIVE NEGATIVE Final    Comment: (NOTE) If result is NEGATIVE SARS-CoV-2 target nucleic acids are NOT DETECTED. The SARS-CoV-2 RNA is generally detectable in upper and lower  respiratory specimens during the acute phase of infection. The lowest  concentration of SARS-CoV-2 viral copies this assay can detect is 250  copies / mL. A negative result does not preclude SARS-CoV-2 infection  and should not be used as the sole basis for  treatment or other  patient management decisions.  A negative result may occur with  improper specimen collection / handling, submission of specimen other  than nasopharyngeal swab, presence of viral mutation(s) within the  areas targeted by this assay, and inadequate number of viral copies  (<250 copies / mL). A negative result must be combined with clinical  observations, patient history, and epidemiological information. If result is POSITIVE SARS-CoV-2 target nucleic acids are DETECTED. The SARS-CoV-2 RNA is generally detectable in upper and lower  respiratory specimens dur ing the acute phase of infection.  Positive  results are indicative of active infection with SARS-CoV-2.  Clinical  correlation with patient history and other diagnostic information is  necessary to determine patient infection status.  Positive results do  not rule out bacterial infection or co-infection with other viruses. If result is PRESUMPTIVE POSTIVE SARS-CoV-2 nucleic acids MAY BE PRESENT.   A presumptive positive result was obtained on the submitted specimen  and confirmed on repeat testing.  While 2019 novel coronavirus  (SARS-CoV-2) nucleic acids may be present in the submitted sample  additional confirmatory testing may be necessary for epidemiological  and / or clinical management purposes  to differentiate between  SARS-CoV-2 and other Sarbecovirus currently known to infect humans.  If clinically indicated additional testing with an alternate test  methodology [phone]) is advised. The SARS-CoV-2 RNA is generally  detectable in upper and lower respiratory sp ecimens during the acute  phase of infection. The expected result is Negative. Fact Sheet for Patients:  StrictlyIdeas.no Fact Sheet for Healthcare Providers: BankingDealers.co.za This test is not yet approved or cleared by the [place] FDA and has been authorized for detection and/or  diagnosis of SARS-CoV-2 by FDA under an Emergency Use Authorization (EUA).  This EUA will remain in effect (meaning this test can be used) for the duration of the COVID-19 declaration under Section 564(b)(1) of the Act, 21 U.S.C. section 360bbb-3(b)(1), unless the authorization is terminated or revoked sooner. Performed at [facility], [address] [place], [place] [address]   Culture, blood (routine x 2)     Status: Abnormal (Preliminary result)   Collection Time: 03/31/19  8:43 PM   Specimen: BLOOD  Result Value Ref Range Status   Specimen Description   Final    BLOOD LEFT ANTECUBITAL Performed at [facility], [address] [place], [place] [address]    Special Requests   Final    BOTTLES DRAWN AEROBIC AND ANAEROBIC Blood Culture adequate volume Performed at [facility], [address] [place], [place] [address]    Culture  Setup Time   Final    GRAM POSITIVE COCCI AEROBIC BOTTLE ONLY CRITICAL VALUE NOTED.  VALUE IS  CONSISTENT WITH PREVIOUSLY REPORTED AND CALLED VALUE. [place] Performed at [facility], [place] [address], [place], [place] [address]    Culture STAPHYLOCOCCUS EPIDERMIDIS (A)  Final   Report Status PENDING  Incomplete  Culture, blood (routine x 2)     Status: Abnormal (Preliminary result)   Collection Time: 03/31/19  8:43 PM   Specimen: BLOOD  Result Value Ref Range Status   Specimen Description   Final    BLOOD RIGHT ANTECUBITAL Performed at [facility], [address] [place], [place] [address]    Special Requests   Final    BOTTLES DRAWN AEROBIC AND ANAEROBIC Blood Culture adequate volume Performed at [facility], [address] [place], [place] [address]    Culture  Setup Time   Final    Organism ID to follow GRAM POSITIVE COCCI IN BOTH AEROBIC AND ANAEROBIC BOTTLES CRITICAL RESULT CALLED TO, READ BACK BY AND VERIFIED WITH: MYRA SLAUGHTER ON  04/01/2019 AT 1344 QSD GRAM STAIN REVIEWED-AGREE WITH RESULT T. TYSOR Performed at [facility], [place] [address] [place], [place] [address]    Culture STAPHYLOCOCCUS EPIDERMIDIS (A)  Final   Report Status PENDING  Incomplete  Blood Culture ID Panel (Reflexed)     Status: Abnormal   Collection Time: 03/31/19  8:43 PM  Result Value Ref Range Status   Enterococcus species NOT DETECTED NOT DETECTED Final   Listeria monocytogenes NOT DETECTED NOT DETECTED Final   Staphylococcus species DETECTED (A) NOT DETECTED Final    Comment: Methicillin (oxacillin) resistant coagulase negative staphylococcus. Possible blood culture contaminant (unless isolated from more than one blood culture draw or clinical case suggests pathogenicity). No antibiotic treatment is indicated for blood  culture contaminants. CRITICAL RESULT CALLED TO, READ BACK BY AND VERIFIED WITH: MYRA SLAUGHTER ON 04/01/2019 AT 1344 QSD    Staphylococcus aureus (BCID) NOT DETECTED NOT DETECTED Final   Methicillin resistance DETECTED (A) NOT DETECTED Final    Comment: CRITICAL RESULT CALLED TO, READ BACK BY AND VERIFIED WITH: MYRA SLAUGHTER ON 04/01/2019 AT 1344 QSD    Streptococcus species NOT DETECTED NOT DETECTED Final   Streptococcus agalactiae NOT DETECTED NOT DETECTED Final   Streptococcus pneumoniae NOT DETECTED NOT DETECTED Final   Streptococcus pyogenes NOT DETECTED NOT DETECTED Final   Acinetobacter baumannii NOT DETECTED NOT DETECTED Final   Enterobacteriaceae species NOT DETECTED NOT DETECTED Final   Enterobacter cloacae complex NOT DETECTED NOT DETECTED Final   Escherichia coli NOT DETECTED NOT DETECTED Final   Klebsiella oxytoca NOT DETECTED NOT DETECTED Final   Klebsiella pneumoniae NOT DETECTED NOT DETECTED Final   Proteus species NOT DETECTED NOT DETECTED Final   Serratia marcescens NOT DETECTED NOT DETECTED Final   Haemophilus influenzae NOT DETECTED NOT DETECTED Final   Neisseria meningitidis NOT DETECTED NOT  DETECTED Final   Pseudomonas aeruginosa NOT DETECTED NOT DETECTED Final   Candida albicans NOT DETECTED NOT DETECTED Final   Candida glabrata NOT DETECTED NOT DETECTED Final   Candida krusei NOT DETECTED NOT DETECTED Final   Candida parapsilosis NOT DETECTED NOT DETECTED Final   Candida tropicalis NOT DETECTED NOT DETECTED Final    Comment: Performed at [facility], [address] [place],  [address]  Urine culture     Status: Abnormal   Collection Time: 03/31/19 10:13 PM   Specimen: Urine, Random  Result Value Ref Range Status   Specimen Description   Final    URINE, RANDOM Performed  at [facility], [address] [place], [place] [address]    Special Requests   Final    NONE Performed at [facility], [address] [place], [place] [address]    Culture MULTIPLE SPECIES PRESENT, SUGGEST RECOLLECTION (A)  Final   Report Status 04/02/2019 FINAL  Final  MRSA PCR Screening     Status: None   Collection Time: 04/01/19  1:54 AM   Specimen: Nasopharyngeal  Result Value Ref Range Status   MRSA by PCR NEGATIVE NEGATIVE Final    Comment:        The GeneXpert MRSA Assay (FDA approved for NASAL specimens only), is one component of a comprehensive MRSA colonization surveillance program. It is not intended to diagnose MRSA infection nor to guide or monitor treatment for MRSA infections. Performed at [facility], [address] [place], [place] [address]       Mild reticular opacities in the right greater than left lung base, .I have personally reviewed the films ? Impression/Recommendation 63 y.o. 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 ?  Encephalopathy secondary to alcohol abuse and withdrawal syndrome- also on precedex, r/o wernicke's  Staph epidermidis bacteremia- 3/4 bottle so a true pathogen- likely source could be the skin abrasion on both knees. He has B/l TKA - hence watch closely for infection Agree with vancomycin- will evaluate later to see whether he needs TEE   Acute GI bleed - has a h/o erosive esophagitis- and gastric ulcer- on PPI  Anemia- due to above- received blood transfusion  Fall secondary to all of the above- rhabdomyolysis  Pneumonia rt lower lobe likely aspiration on unasyn  ?AKI, lactic acidosis, hypokalemia and hyponatremia have resolved Had hypercalcemia on presentation which has normalized ___________________________________________________ Discussed with requesting provider and pharmacist Note:  This document was prepared using Dragon voice recognition software and may include unintentional dictation errors.

## 2019-04-03 DIAGNOSIS — J9601 Acute respiratory failure with hypoxia: Secondary | ICD-10-CM

## 2019-04-03 LAB — CBC
HCT: 29.4 % — ABNORMAL LOW (ref 39.0–52.0)
Hemoglobin: 8 g/dL — ABNORMAL LOW (ref 13.0–17.0)
MCH: 22.7 pg — ABNORMAL LOW (ref 26.0–34.0)
MCHC: 27.2 g/dL — ABNORMAL LOW (ref 30.0–36.0)
MCV: 83.3 fL (ref 80.0–100.0)
Platelets: 148 10*3/uL — ABNORMAL LOW (ref 150–400)
RBC: 3.53 MIL/uL — ABNORMAL LOW (ref 4.22–5.81)
RDW: 26 % — ABNORMAL HIGH (ref 11.5–15.5)
WBC: 9.5 10*3/uL (ref 4.0–10.5)
nRBC: 0.7 % — ABNORMAL HIGH (ref 0.0–0.2)

## 2019-04-03 LAB — BASIC METABOLIC PANEL
Anion gap: 9 (ref 5–15)
BUN: 18 mg/dL (ref 8–23)
CO2: 29 mmol/L (ref 22–32)
Calcium: 9.1 mg/dL (ref 8.9–10.3)
Chloride: 105 mmol/L (ref 98–111)
Creatinine, Ser: 0.58 mg/dL — ABNORMAL LOW (ref 0.61–1.24)
GFR calc Af Amer: >60 mL/min (ref >60)
GFR calc non Af Amer: >60 mL/min (ref >60)
Glucose, Bld: 113 mg/dL — ABNORMAL HIGH (ref 70–99)
Potassium: 3.2 mmol/L — ABNORMAL LOW (ref 3.5–5.1)
Sodium: 143 mmol/L (ref 135–145)

## 2019-04-03 LAB — HIV ANTIBODY (ROUTINE TESTING W REFLEX): HIV Screen 4th Generation wRfx: NONREACTIVE

## 2019-04-03 LAB — GLUCOSE, CAPILLARY
Glucose-Capillary: 104 mg/dL — ABNORMAL HIGH (ref 70–99)
Glucose-Capillary: 107 mg/dL — ABNORMAL HIGH (ref 70–99)

## 2019-04-03 LAB — VANCOMYCIN, TROUGH: Vancomycin Tr: 33 ug/mL (ref 15–20)

## 2019-04-03 MED ORDER — SODIUM CHLORIDE 0.9 % IV SOLN
INTRAVENOUS | Status: DC | PRN
  Administered 2019-04-03 – 2019-04-04 (×5): 500 mL via INTRAVENOUS
  Administered 2019-04-05: 100 mL via INTRAVENOUS
  Administered 2019-04-12 – 2019-04-13 (×2): 250 mL via INTRAVENOUS

## 2019-04-03 MED ORDER — VITAMIN B-1 100 MG PO TABS
500.0000 mg | ORAL_TABLET | Freq: Every day | ORAL | Status: DC
  Administered 2019-04-03 – 2019-04-04 (×2): 500 mg via ORAL
  Filled 2019-04-03 (×2): qty 5

## 2019-04-03 MED ORDER — TAB-A-VITE/IRON PO TABS
1.0000 | ORAL_TABLET | Freq: Every day | ORAL | Status: DC
  Administered 2019-04-03 – 2019-04-04 (×2): 1 via ORAL
  Filled 2019-04-03 (×2): qty 1

## 2019-04-03 MED ORDER — PANTOPRAZOLE SODIUM 40 MG PO TBEC
40.0000 mg | DELAYED_RELEASE_TABLET | Freq: Two times a day (BID) | ORAL | Status: DC
  Administered 2019-04-03 – 2019-04-05 (×5): 40 mg via ORAL
  Filled 2019-04-03 (×6): qty 1

## 2019-04-03 MED ORDER — LORAZEPAM 2 MG/ML IJ SOLN
1.0000 mg | INTRAMUSCULAR | Status: DC | PRN
  Administered 2019-04-04: 1 mg via INTRAVENOUS
  Filled 2019-04-03: qty 1

## 2019-04-03 MED ORDER — POTASSIUM CHLORIDE 2 MEQ/ML IV SOLN
INTRAVENOUS | Status: AC
  Administered 2019-04-03 (×2): via INTRAVENOUS
  Filled 2019-04-03: qty 1000

## 2019-04-03 NOTE — Evaluation (Signed)
Physical Therapy Evaluation Patient Details Name: George Wade MRN: [id number] DOB: [birth year] Today's Date: 04/03/2019   History of Present Illness  presented to ER secondary to progressive weakness, black/tarry stools and fall/unable to recover (outside in rain x4-5 hours); admitted for management of GIB, aspiration pna, encephalopathy and staph bacteremia.  Clinical Impression  Upon evaluation, patient alert and oriented to self, location and general situation; follows simple commands, but demonstrates noted deficits in STM, insight/awareness.  Generally weak and deconditioned throughout all extremities; generalized soreness reported throughout.  Does demonstrate isolated weakness/ROM deficits to R shoulder, R ankle and intermittent tremulousness throughout bilat LEs.  Currently requiring mod assist for bed mobility; mod assist for sit/stand, basic transfers and gait (4') with RW.  3-point, gait pattern with heavy WBing bilat UEs; assist from therapist to guide walker and direct movement (frequent verbal cuing for redirection to task).  R foot drop noted, patient with poor awareness of such (acute?).  Poor balance, overall poor safety. Requires RW and +1 at all times.  Will continue to assess/progess mobility as medically appropriate. Of note, patient vitals stable and WFL on RA throughout session. Would benefit from skilled PT to address above deficits and promote optimal return to PLOF; recommend transition to STR upon discharge from acute hospitalization.     Follow Up Recommendations SNF    Equipment Recommendations       Recommendations for Other Services       Precautions / Restrictions Precautions Precautions: Fall Restrictions Weight Bearing Restrictions: No      Mobility  Bed Mobility Overal bed mobility: Needs Assistance Bed Mobility: Supine to Sit     Supine to sit: Mod assist     General bed mobility comments: assist for LE management, truncal  elevation  Transfers Overall transfer level: Needs assistance Equipment used: Rolling walker (2 wheeled) Transfers: Sit to/from Stand Sit to Stand: Mod assist         General transfer comment: cuing for hand placement, assit for lift off and standing balance; mod tremulousness bilat LEs in closed-chain  Ambulation/Gait Ambulation/Gait assistance: Mod assist Gait Distance (Feet): 4 Feet Assistive device: Rolling walker (2 wheeled)       General Gait Details: 3-point, gait pattern with heavy WBing bilat UEs; assist from therapist to guide walker and direct movement (frequent verbal cuing for redirection to task).  R foot drop noted, patient with poor awareness of such (acute?).  Poor balance, overall poor safety. Requires RW and +1 at all times.  Stairs            Wheelchair Mobility    Modified Rankin (Stroke Patients Only)       Balance Overall balance assessment: Needs assistance Sitting-balance support: No upper extremity supported;Feet supported Sitting balance-Leahy Scale: Fair     Standing balance support: Bilateral upper extremity supported Standing balance-Leahy Scale: Poor                               Pertinent Vitals/Pain Pain Assessment: (endorses generalized soreness, unrated, throughout body)    Home Living Family/patient expects to be discharged to:: Private residence Living Arrangements: Alone   Type of Home: House Home Access: Stairs to enter Entrance Stairs-Rails: Right;Left;Can reach both Entrance Stairs-Number of Steps: 6 Home Layout: One level Home Equipment: None      Prior Function Level of Independence: Independent         Comments: Indep with ADLs, household and community mobilization without  assist device     Hand Dominance   Dominant Hand: Right    Extremity/Trunk Assessment   Upper Extremity Assessment Upper Extremity Assessment: (R shoulder grossly 3-/5 (limited by pain), R elbow and wrist 4-/5; L UE  grossly 4-/5)    Lower Extremity Assessment Lower Extremity Assessment: Generalized weakness(R ankle grossly 2+/5, otherwise, LEs grossly 3-/5)       Communication   Communication: No difficulties  Cognition Arousal/Alertness: Awake/alert Behavior During Therapy: WFL for tasks assessed/performed Overall Cognitive Status: No family/caregiver present to determine baseline cognitive functioning                                 General Comments: oriented to self, general location, situation; follows simple commands, but demonstrates noted recall with STM and immediate/delayed recall of new information      General Comments      Exercises Other Exercises Other Exercises: Sit/stand x2 with RW, mod assist +1--limited carry-over of new information between trials   Assessment/Plan    PT Assessment Patient needs continued PT services  PT Problem List Decreased strength;Decreased activity tolerance;Decreased mobility;Decreased cognition;Decreased safety awareness;Cardiopulmonary status limiting activity;Decreased range of motion;Decreased balance;Decreased coordination;Decreased knowledge of use of DME;Decreased knowledge of precautions;Pain       PT Treatment Interventions DME instruction;Stair training;Therapeutic activities;Balance training;Cognitive remediation;Therapeutic exercise;Gait training;Functional mobility training;Patient/family education;Neuromuscular re-education    PT Goals (Current goals can be found in the Care Plan section)  Acute Rehab PT Goals Patient Stated Goal: agreeable to session, eager for OOB to chair PT Goal Formulation: With patient Time For Goal Achievement: 04/17/19 Potential to Achieve Goals: Fair    Frequency Min 2X/week   Barriers to discharge Decreased caregiver support      Co-evaluation               AM-PAC PT "6 Clicks" Mobility  Outcome Measure Help needed turning from your back to your side while in a flat bed  without using bedrails?: A Lot Help needed moving from lying on your back to sitting on the side of a flat bed without using bedrails?: A Lot Help needed moving to and from a bed to a chair (including a wheelchair)?: A Lot Help needed standing up from a chair using your arms (e.g., wheelchair or bedside chair)?: A Lot Help needed to walk in hospital room?: A Lot Help needed climbing 3-5 steps with a railing? : Total 6 Click Score: 11    End of Session Equipment Utilized During Treatment: Gait belt Activity Tolerance: Patient tolerated treatment well Patient left: in chair;with call bell/phone within reach(nursing aware of position) Nurse Communication: Mobility status PT Visit Diagnosis: Muscle weakness (generalized) (M62.81);Difficulty in walking, not elsewhere classified (R26.2);History of falling (Z91.81);Unsteadiness on feet (R26.81)    Time: 2248-2500 PT Time Calculation (min) (ACUTE ONLY): 23 min   Charges:   PT Evaluation $PT Eval Moderate Complexity: 1 Mod PT Treatments $Therapeutic Activity: 8-22 mins       Azaria Bartell H. Owens Shark, PT, DPT, NCS 04/03/19, 11:21 PM [phone]

## 2019-04-03 NOTE — Progress Notes (Signed)
Cognition much improved.  Oriented to person place and time.  He is now off dexmedetomidine infusion.  He is requiring low flow nasal cannula oxygen but is in no respiratory distress.  He denies shortness of breath and pain.  No new complaints.   Yesterday, his blood cultures came back 2/2 for Staphylococcus epidermidis.  ID consultation was obtained and he is presently on vancomycin for this in addition to amp-sulbactam for aspiration PNA  Vitals:   04/03/19 0800 04/03/19 0846 04/03/19 0900 04/03/19 1000  BP: (!) 133/98  (!) 139/93 (!) 146/82  Pulse: 67  68 78  Resp: (!) 21  18 20   Temp: 98.5 F (36.9 C)     TempSrc: Oral     SpO2: 97% 95% 92% 94%  Weight:      Height:      3 LPM   Gen: NAD HEENT: NCAT, Mild sclericterus Neck: No JVD noted Lungs: breath sounds full, scattered rhonchi, no wheezes Cardiovascular: RRR, no murmurs Abdomen: Soft, nontender, normal BS Ext: Excoriations/abrasions over both knees Neuro: grossly intact Skin: Limited exam, no lesions noted   BMP Latest Ref Rng & Units 04/03/2019 04/02/2019 04/01/2019  Glucose 70 - 99 mg/dL 113(H) 121(H) 120(H)  BUN 8 - 23 mg/dL 18 22 19   Creatinine 0.61 - 1.24 mg/dL 0.58(L) 0.84 1.02  Sodium 135 - 145 mmol/L 143 140 128(L)  Potassium 3.5 - 5.1 mmol/L 3.2(L) 3.9 3.5  Chloride 98 - 111 mmol/L 105 105 87(L)  CO2 22 - 32 mmol/L 29 25 27   Calcium 8.9 - 10.3 mg/dL 9.1 9.5 10.5(H)   Hepatic Function Latest Ref Rng & Units 04/02/2019 03/31/2019 12/09/2017  Total Protein 6.5 - 8.1 g/dL 5.0(L) 6.3(L) 6.6  Albumin 3.5 - 5.0 g/dL 2.3(L) 2.9(L) 3.9  AST 15 - 41 U/L 207(H) 213(H) 97(H)  ALT 0 - 44 U/L 98(H) 98(H) 89(H)  Alk Phosphatase 38 - 126 U/L 166(H) 280(H) 61  Total Bilirubin 0.3 - 1.2 mg/dL 2.2(H) 1.9(H) 1.6(H)   CBC Latest Ref Rng & Units 04/03/2019 04/02/2019 04/01/2019  WBC 4.0 - 10.5 K/uL 9.5 10.3 -  Hemoglobin 13.0 - 17.0 g/dL 8.0(L) 8.0(L) 7.2(L)  Hematocrit 39.0 - 52.0 % 29.4(L) 28.9(L) 25.2(L)  Platelets 150 - 400  K/uL 148(L) 187 -    Results for orders placed or performed during the hospital encounter of 03/31/19  SARS Coronavirus 2 (CEPHEID - Performed in [place] hospital lab), Hosp Order     Status: None   Collection Time: 03/31/19  8:43 PM   Specimen: Nasopharyngeal Swab  Result Value Ref Range Status   SARS Coronavirus 2 NEGATIVE NEGATIVE Final    Comment: (NOTE) If result is NEGATIVE SARS-CoV-2 target nucleic acids are NOT DETECTED. The SARS-CoV-2 RNA is generally detectable in upper and lower  respiratory specimens during the acute phase of infection. The lowest  concentration of SARS-CoV-2 viral copies this assay can detect is 250  copies / mL. A negative result does not preclude SARS-CoV-2 infection  and should not be used as the sole basis for treatment or other  patient management decisions.  A negative result may occur with  improper specimen collection / handling, submission of specimen other  than nasopharyngeal swab, presence of viral mutation(s) within the  areas targeted by this assay, and inadequate number of viral copies  (<250 copies / mL). A negative result must be combined with clinical  observations, patient history, and epidemiological information. If result is POSITIVE SARS-CoV-2 target nucleic acids are DETECTED. The  SARS-CoV-2 RNA is generally detectable in upper and lower  respiratory specimens dur ing the acute phase of infection.  Positive  results are indicative of active infection with SARS-CoV-2.  Clinical  correlation with patient history and other diagnostic information is  necessary to determine patient infection status.  Positive results do  not rule out bacterial infection or co-infection with other viruses. If result is PRESUMPTIVE POSTIVE SARS-CoV-2 nucleic acids MAY BE PRESENT.   A presumptive positive result was obtained on the submitted specimen  and confirmed on repeat testing.  While 2019 novel coronavirus  (SARS-CoV-2) nucleic acids may be  present in the submitted sample  additional confirmatory testing may be necessary for epidemiological  and / or clinical management purposes  to differentiate between  SARS-CoV-2 and other Sarbecovirus currently known to infect humans.  If clinically indicated additional testing with an alternate test  methodology [phone]) is advised. The SARS-CoV-2 RNA is generally  detectable in upper and lower respiratory sp ecimens during the acute  phase of infection. The expected result is Negative. Fact Sheet for Patients:  StrictlyIdeas.no Fact Sheet for Healthcare Providers: BankingDealers.co.za This test is not yet approved or cleared by the [place] FDA and has been authorized for detection and/or diagnosis of SARS-CoV-2 by FDA under an Emergency Use Authorization (EUA).  This EUA will remain in effect (meaning this test can be used) for the duration of the COVID-19 declaration under Section 564(b)(1) of the Act, 21 U.S.C. section 360bbb-3(b)(1), unless the authorization is terminated or revoked sooner. Performed at [facility], [address] [place], [place] [address]   Culture, blood (routine x 2)     Status: Abnormal (Preliminary result)   Collection Time: 03/31/19  8:43 PM   Specimen: BLOOD  Result Value Ref Range Status   Specimen Description   Final    BLOOD LEFT ANTECUBITAL Performed at [facility], [address] [place], [place] [address]    Special Requests   Final    BOTTLES DRAWN AEROBIC AND ANAEROBIC Blood Culture adequate volume Performed at [facility], [address] [place], [place] [address]    Culture  Setup Time   Final    GRAM POSITIVE COCCI AEROBIC BOTTLE ONLY CRITICAL VALUE NOTED.  VALUE IS CONSISTENT WITH PREVIOUSLY REPORTED AND CALLED VALUE. [employer] Performed at [facility], [place] [address] [place], [place] [address]     Culture STAPHYLOCOCCUS EPIDERMIDIS (A)  Final   Report Status PENDING  Incomplete  Culture, blood (routine x 2)     Status: Abnormal (Preliminary result)   Collection Time: 03/31/19  8:43 PM   Specimen: BLOOD  Result Value Ref Range Status   Specimen Description   Final    BLOOD RIGHT ANTECUBITAL Performed at [facility], [address], [place], [place] [address]    Special Requests   Final    BOTTLES DRAWN AEROBIC AND ANAEROBIC Blood Culture adequate volume Performed at [facility], [address] [place], [place] [address]    Culture  Setup Time   Final    GRAM POSITIVE COCCI IN BOTH AEROBIC AND ANAEROBIC BOTTLES CRITICAL RESULT CALLED TO, READ BACK BY AND VERIFIED WITH: MYRA SLAUGHTER ON 04/01/2019 AT 1344 QSD GRAM STAIN REVIEWED-AGREE WITH RESULT T. TYSOR    Culture (A)  Final    STAPHYLOCOCCUS EPIDERMIDIS SUSCEPTIBILITIES TO FOLLOW Performed at [facility], [place] [address] [place], [place] [address]    Report  Status PENDING  Incomplete  Blood Culture ID Panel (Reflexed)     Status: Abnormal   Collection Time: 03/31/19  8:43 PM  Result Value Ref Range Status   Enterococcus species NOT DETECTED NOT DETECTED Final   Listeria monocytogenes NOT DETECTED NOT DETECTED Final   Staphylococcus species DETECTED (A) NOT DETECTED Final    Comment: Methicillin (oxacillin) resistant coagulase negative staphylococcus. Possible blood culture contaminant (unless isolated from more than one blood culture draw or clinical case suggests pathogenicity). No antibiotic treatment is indicated for blood  culture contaminants. CRITICAL RESULT CALLED TO, READ BACK BY AND VERIFIED WITH: MYRA SLAUGHTER ON 04/01/2019 AT 1344 QSD    Staphylococcus aureus (BCID) NOT DETECTED NOT DETECTED Final   Methicillin resistance DETECTED (A) NOT DETECTED Final    Comment: CRITICAL RESULT CALLED TO, READ BACK BY AND VERIFIED WITH: MYRA SLAUGHTER ON 04/01/2019 AT 1344 QSD     Streptococcus species NOT DETECTED NOT DETECTED Final   Streptococcus agalactiae NOT DETECTED NOT DETECTED Final   Streptococcus pneumoniae NOT DETECTED NOT DETECTED Final   Streptococcus pyogenes NOT DETECTED NOT DETECTED Final   Acinetobacter baumannii NOT DETECTED NOT DETECTED Final   Enterobacteriaceae species NOT DETECTED NOT DETECTED Final   Enterobacter cloacae complex NOT DETECTED NOT DETECTED Final   Escherichia coli NOT DETECTED NOT DETECTED Final   Klebsiella oxytoca NOT DETECTED NOT DETECTED Final   Klebsiella pneumoniae NOT DETECTED NOT DETECTED Final   Proteus species NOT DETECTED NOT DETECTED Final   Serratia marcescens NOT DETECTED NOT DETECTED Final   Haemophilus influenzae NOT DETECTED NOT DETECTED Final   Neisseria meningitidis NOT DETECTED NOT DETECTED Final   Pseudomonas aeruginosa NOT DETECTED NOT DETECTED Final   Candida albicans NOT DETECTED NOT DETECTED Final   Candida glabrata NOT DETECTED NOT DETECTED Final   Candida krusei NOT DETECTED NOT DETECTED Final   Candida parapsilosis NOT DETECTED NOT DETECTED Final   Candida tropicalis NOT DETECTED NOT DETECTED Final    Comment: Performed at [facility], [address] [place], [place] [address]  Urine culture     Status: Abnormal   Collection Time: 03/31/19 10:13 PM   Specimen: Urine, Random  Result Value Ref Range Status   Specimen Description   Final    URINE, RANDOM Performed at [facility], [address], [place], [place] [address]    Special Requests   Final    NONE Performed at [facility], [address] [place], [place] [address]    Culture MULTIPLE SPECIES PRESENT, SUGGEST RECOLLECTION (A)  Final   Report Status 04/02/2019 FINAL  Final  MRSA PCR Screening     Status: None   Collection Time: 04/01/19  1:54 AM   Specimen: Nasopharyngeal  Result Value Ref Range Status   MRSA by PCR NEGATIVE NEGATIVE Final    Comment:        The GeneXpert MRSA Assay (FDA approved  for NASAL specimens only), is one component of a comprehensive MRSA colonization surveillance program. It is not intended to diagnose MRSA infection nor to guide or monitor treatment for MRSA infections. Performed at [facility], [address] [place], [place] [address]   CULTURE, BLOOD (ROUTINE X 2) w Reflex to ID Panel     Status: None (Preliminary result)   Collection Time: 04/02/19  3:05 PM   Specimen: BLOOD RIGHT WRIST  Result Value Ref Range Status   Specimen Description BLOOD RIGHT WRIST  Final   Special Requests NONE  Final  Culture   Final    NO GROWTH < 24 HOURS Performed at [facility], [address] [place], [place] [address]    Report Status PENDING  Incomplete  Culture, blood (Routine X 2) w Reflex to ID Panel     Status: None (Preliminary result)   Collection Time: 04/02/19 10:45 PM   Specimen: BLOOD  Result Value Ref Range Status   Specimen Description BLOOD RIGHT ASSIST CONTROL  Final   Special Requests   Final    BOTTLES DRAWN AEROBIC AND ANAEROBIC Blood Culture adequate volume   Culture   Final    NO GROWTH < 12 HOURS Performed at [facility], [address] [place], [place] [address]    Report Status PENDING  Incomplete     Anti-infectives (From admission, onward)   Start     Dose/Rate Route Frequency Ordered Stop   04/03/19 0000  vancomycin (VANCOCIN) IVPB 1000 mg/200 mL premix     1,000 mg 200 mL/hr over 60 Minutes Intravenous Every 12 hours 04/02/19 1405     04/02/19 1430  vancomycin (VANCOCIN) 1,500 mg in sodium chloride 0.9 % 500 mL IVPB  Status:  Discontinued     1,500 mg 250 mL/hr over 120 Minutes Intravenous  Once 04/02/19 1353 04/02/19 1356   04/02/19 1400  vancomycin (VANCOCIN) 1,500 mg in sodium chloride 0.9 % 500 mL IVPB     1,500 mg 250 mL/hr over 120 Minutes Intravenous  Once 04/02/19 1356 04/02/19 1652   04/01/19 1500  Ampicillin-Sulbactam (UNASYN) 3 g in sodium chloride 0.9 % 100 mL IVPB     3  g 200 mL/hr over 30 Minutes Intravenous Every 6 hours 04/01/19 1107     04/01/19 1000  ceFEPIme (MAXIPIME) 2 g in sodium chloride 0.9 % 100 mL IVPB  Status:  Discontinued     2 g 200 mL/hr over 30 Minutes Intravenous Every 12 hours 03/31/19 2340 04/01/19 0106   04/01/19 0130  Ampicillin-Sulbactam (UNASYN) 3 g in sodium chloride 0.9 % 100 mL IVPB  Status:  Discontinued     3 g 200 mL/hr over 30 Minutes Intravenous Every 8 hours 04/01/19 0120 04/01/19 1107   04/01/19 0000  vancomycin (VANCOCIN) 2,000 mg in sodium chloride 0.9 % 500 mL IVPB  Status:  Discontinued     2,000 mg 250 mL/hr over 120 Minutes Intravenous  Once 03/31/19 2355 04/01/19 0106   03/31/19 2345  vancomycin (VANCOCIN) IVPB 1000 mg/200 mL premix  Status:  Discontinued     1,000 mg 200 mL/hr over 60 Minutes Intravenous  Once 03/31/19 2340 03/31/19 2355   03/31/19 2045  cefTRIAXone (ROCEPHIN) 1 g in sodium chloride 0.9 % 100 mL IVPB     1 g 200 mL/hr over 30 Minutes Intravenous  Once 03/31/19 2039 03/31/19 2143      CXR: No new film  IMPRESSION:  Acute UGIB, no longer bleeding RLL aspiration pneumonia Acute hypoxemic respiratory failure mild Acute blood loss anemia.  Not actively bleeding Alcohol withdrawal syndrome, much improved Coag negative staph bacteremia (2 of 2 BC +.  Therefore likely real) Lactic acidosis, resolved Rhabdomyolysis, resolved AKI, resolved Hypokalemia, resolved Mildly elevated LFTs   PLAN/REC: Transfer to MedSurg floor After transfer, PCCM will sign off. Please call if we can be of further assistance Continue supplemental oxygen to maintain SPO2 >90% Continue current antibiotics per ID service Monitor BMET intermittently Monitor I/Os Correct electrolytes as indicated Maintenance IVFs adjusted Advance diet and activity Continue pantoprazole now and after  discharge (see GI recommendations) Continue low-dose lorazepam as needed for alcohol withdrawal syndrome.     Merton Border,  MD PCCM service Mobile [phone] Pager [phone] 04/03/2019 3:07 PM

## 2019-04-03 NOTE — Progress Notes (Signed)
[facility] NAME: George Wade    MR#:  [id number]  DATE OF BIRTH:  June 22, 1956  SUBJECTIVE:   Patient states he is feeling much better today.  His breathing is better.  He denies any abdominal pain.  He is feeling more like his normal self.  NG tube was taken out yesterday.  REVIEW OF SYSTEMS:  Review of Systems  Constitutional: Positive for malaise/fatigue. Negative for chills and fever.  HENT: Negative for congestion and sore throat.   Eyes: Negative for blurred vision and double vision.  Respiratory: Negative for cough and shortness of breath.   Cardiovascular: Negative for chest pain and palpitations.  Gastrointestinal: Negative for nausea and vomiting.  Genitourinary: Negative for dysuria and urgency.  Musculoskeletal: Negative for back pain and neck pain.  Neurological: Positive for weakness. Negative for dizziness, focal weakness and headaches.  Psychiatric/Behavioral: Negative for depression. The patient is not nervous/anxious.     DRUG ALLERGIES:  No Known Allergies VITALS:  Blood pressure (!) 146/82, pulse 78, temperature 98.5 F (36.9 C), temperature source Oral, resp. rate 20, height 5\' 9"  (1.753 m), weight 78 kg, SpO2 94 %. PHYSICAL EXAMINATION:  Physical Exam  GENERAL:  Laying in the bed with no acute distress. HEENT: Head atraumatic, normocephalic. Pupils equal, round, reactive to light and accommodation. No scleral icterus. Extraocular muscles intact. Oropharynx and nasopharynx clear. NECK:  Supple, no jugular venous distention. No thyroid enlargement. LUNGS: Lungs are clear to auscultation bilaterally. No wheezes, crackles, rhonchi. No use of accessory muscles of respiration.  CARDIOVASCULAR: RRR, S1, S2 normal. No murmurs, rubs, or gallops.  ABDOMEN: Soft, nontender, nondistended. Bowel sounds present.  EXTREMITIES: No pedal edema, cyanosis, or clubbing.  NEUROLOGIC: CN 2-12 intact, no focal deficits.  +global weakness. Sensation intact throughout. Gait not checked.  PSYCHIATRIC: The patient is alert and oriented x 3.  SKIN: No obvious rash, lesion, or ulcer. Multiple ecchymoses present. LABORATORY PANEL:  Male CBC Recent Labs  Lab 04/03/19 0422  WBC 9.5  HGB 8.0*  HCT 29.4*  PLT 148*   ------------------------------------------------------------------------------------------------------------------ Chemistries  Recent Labs  Lab 03/31/19 1950  04/02/19 0556 04/03/19 0422  NA 128*   < > 140 143  K 3.2*   < > 3.9 3.2*  CL 78*   < > 105 105  CO2 12*   < > 25 29  GLUCOSE 82   < > 121* 113*  BUN 17   < > 22 18  CREATININE 1.54*   < > 0.84 0.58*  CALCIUM 13.4*   < > 9.5 9.1  MG 2.2  --   --   --   AST 213*  --  207*  --   ALT 98*  --  98*  --   ALKPHOS 280*  --  166*  --   BILITOT 1.9*  --  2.2*  --    < > = values in this interval not displayed.   RADIOLOGY:  No results found. ASSESSMENT AND PLAN:   GI bleed- likely due to PUD. -GI following- recommend outpatient colonoscopy -Continue Protonix IV bid, will also continue on discharge  Acute blood loss anemia- hemoglobin stable s/p 2 units PRBC -Serial H/H -Transfuse for hemoglobin < 7  Coag-negative Staph bacteremia -Continue vancomycin -ID consult  Aspiration pneumonia -Continue unasyn  Acute rhabdomyolysis- resolved with IVFs -IV fluids  Alcohol abuse- LFTs elevated -CIWA protocol  -IV thiamine and folic acid -Alcohol cessation counseling performed this admission  DVT prophylaxis- SCDs.  All the records are reviewed and case discussed with Care Management/Social Worker. Management plans discussed with the patient, family and they are in agreement.  CODE STATUS: Full Code  TOTAL TIME TAKING CARE OF THIS PATIENT: 35 minutes.   More than 50% of the time was spent in counseling/coordination of care: YES  POSSIBLE D/C IN 1-2 DAYS, DEPENDING ON CLINICAL CONDITION.   [name]  M.D on 04/03/2019  at 3:40 PM  Between 7am to 6pm - Pager - [phone]  After 6pm go to [email or web address] - Proofreader  [facility]  Office  [phone]  CC: Primary care physician; Patient, [name] Per  Note: This dictation was prepared with Dragon dictation along with smaller phrase technology. Any transcriptional errors that result from this process are unintentional.

## 2019-04-03 NOTE — Progress Notes (Signed)
Pharmacy Antibiotic Note  George Wade is a 63 y.o. male admitted on 03/31/2019 with aspiration pneumonia and now with CoNS bacteremia.  Pharmacy has been consulted for Unasyn and vancomycin dosing. Since admission his Scr is back to baseline and leukocytosis has resolved but he remains encephalopathic with 3/4 positive blood cultures. - likely source could be the skin abrasion on both knees since he has B/L TKA. ID will evaluate later to see whether he needs TEE  Plan: 1) continue Unasyn 3g IV q8h  2) continue vancomycin 1000 mg IV Q 12 h   Goal AUC 400-550.  Expected AUC: 445  SCr used: 0.80 mg/dL (rounded up)  T1/2:  8.7h  Obtain vancomycin levels with 4th  Dose: 6/24: Vp 1400, Vt 2300  Height: 5\' 9"  (175.3 cm) Weight: 172 lb (78 kg) IBW/kg (Calculated) : 70.7  Temp (24hrs), Avg:98.2 F (36.8 C), Min:98 F (36.7 C), Max:98.3 F (36.8 C)  Recent Labs  Lab 03/31/19 1950 03/31/19 2043 04/01/19 0302 04/02/19 0556 04/03/19 0422  WBC 23.0*  --  15.4* 10.3 9.5  CREATININE 1.54*  --  1.02 0.84 0.58*  LATICACIDVEN  --  >11.0* 5.2* 1.2  --     Estimated Creatinine Clearance: 95.7 mL/min (A) (by C-G formula based on SCr of 0.58 mg/dL (L)).    Microbiology 6/20 BCx 3/4 S epidermidis 6/21 MRSA PCR (-) 6/20 UCx recollect 6/20 SARS CoV-2 (-)  Antibiotics this Admission Unasyn 6/21 >> Vancomycin 6/22 >>  Thank you for allowing pharmacy to be a part of this patient's care.  Vallery Sa, PharmD Clinical Pharmacist 04/03/2019 8:01 AM

## 2019-04-03 NOTE — Progress Notes (Signed)
Report given to RN Barnabas Lister for patient to be transferred to room 215. Patient with two patient belongings bag. Patient's soaked clothing left in a bag, patient stated to throw away that clothing. Patient kept bag with muddy sandals. Patient wearing one ring, one watch and cell phone in hand.

## 2019-04-03 NOTE — Progress Notes (Signed)
[name], MD [facility]   [address], [place], [place] [address] Phone: [phone] Fax : [phone]   Subjective: This patient has had no further signs of any GI bleeding.  The patient's hemoglobin has been stable.  The patient is sitting up and eating and denying any abdominal pain nausea vomiting fevers or chills.  The patient is much more alert today than he was yesterday.   Objective: Vital signs in last 24 hours: Vitals:   04/03/19 0800 04/03/19 0846 04/03/19 0900 04/03/19 1000  BP: (!) 133/98  (!) 139/93 (!) 146/82  Pulse: 67  68 78  Resp: (!) 21  18 20   Temp: 98.5 F (36.9 C)     TempSrc: Oral     SpO2: 97% 95% 92% 94%  Weight:      Height:       Weight change:   Intake/Output Summary (Last 24 hours) at 04/03/2019 1328 Last data filed at 04/03/2019 0800 Gross per 24 hour  Intake 2201.7 ml  Output 800 ml  Net 1401.7 ml     Exam: Heart:: Regular rate and rhythm, S1S2 present or without murmur or extra heart sounds Lungs: normal and clear to auscultation and percussion Abdomen: soft, nontender, normal bowel sounds   Lab Results: @LABTEST2 @ Micro Results: Recent Results (from the past 240 hour(s))  SARS Coronavirus 2 (CEPHEID - Performed in [place] hospital lab), Hosp Order     Status: None   Collection Time: 03/31/19  8:43 PM   Specimen: Nasopharyngeal Swab  Result Value Ref Range Status   SARS Coronavirus 2 NEGATIVE NEGATIVE Final    Comment: (NOTE) If result is NEGATIVE SARS-CoV-2 target nucleic acids are NOT DETECTED. The SARS-CoV-2 RNA is generally detectable in upper and lower  respiratory specimens during the acute phase of infection. The lowest  concentration of SARS-CoV-2 viral copies this assay can detect is 250  copies / mL. A negative result does not preclude SARS-CoV-2 infection  and should not be used as the sole basis for treatment or other  patient management decisions.  A negative result may occur with  improper specimen  collection / handling, submission of specimen other  than nasopharyngeal swab, presence of viral mutation(s) within the  areas targeted by this assay, and inadequate number of viral copies  (<250 copies / mL). A negative result must be combined with clinical  observations, patient history, and epidemiological information. If result is POSITIVE SARS-CoV-2 target nucleic acids are DETECTED. The SARS-CoV-2 RNA is generally detectable in upper and lower  respiratory specimens dur ing the acute phase of infection.  Positive  results are indicative of active infection with SARS-CoV-2.  Clinical  correlation with patient history and other diagnostic information is  necessary to determine patient infection status.  Positive results do  not rule out bacterial infection or co-infection with other viruses. If result is PRESUMPTIVE POSTIVE SARS-CoV-2 nucleic acids MAY BE PRESENT.   A presumptive positive result was obtained on the submitted specimen  and confirmed on repeat testing.  While 2019 novel coronavirus  (SARS-CoV-2) nucleic acids may be present in the submitted sample  additional confirmatory testing may be necessary for epidemiological  and / or clinical management purposes  to differentiate between  SARS-CoV-2 and other Sarbecovirus currently known to infect humans.  If clinically indicated additional testing with an alternate test  methodology [id number]) is advised. The SARS-CoV-2 RNA is generally  detectable in upper and lower respiratory sp ecimens during the acute  phase of infection.  The expected result is Negative. Fact Sheet for Patients:  StrictlyIdeas.no Fact Sheet for Healthcare Providers: BankingDealers.co.za This test is not yet approved or cleared by the [place] FDA and has been authorized for detection and/or diagnosis of SARS-CoV-2 by FDA under an Emergency Use Authorization (EUA).  This EUA will remain in effect  (meaning this test can be used) for the duration of the COVID-19 declaration under Section 564(b)(1) of the Act, 21 U.S.C. section 360bbb-3(b)(1), unless the authorization is terminated or revoked sooner. Performed at [facility], [address] [place], [place] [address]   Culture, blood (routine x 2)     Status: Abnormal (Preliminary result)   Collection Time: 03/31/19  8:43 PM   Specimen: BLOOD  Result Value Ref Range Status   Specimen Description   Final    BLOOD LEFT ANTECUBITAL Performed at [facility], [address], [place], [place] [address]    Special Requests   Final    BOTTLES DRAWN AEROBIC AND ANAEROBIC Blood Culture adequate volume Performed at [facility], [address], [place], [place] [address]    Culture  Setup Time   Final    GRAM POSITIVE COCCI AEROBIC BOTTLE ONLY CRITICAL VALUE NOTED.  VALUE IS CONSISTENT WITH PREVIOUSLY REPORTED AND CALLED VALUE. [place] Performed at [facility], [place] [address] [place], [place] [address]    Culture STAPHYLOCOCCUS EPIDERMIDIS (A)  Final   Report Status PENDING  Incomplete  Culture, blood (routine x 2)     Status: Abnormal (Preliminary result)   Collection Time: 03/31/19  8:43 PM   Specimen: BLOOD  Result Value Ref Range Status   Specimen Description   Final    BLOOD RIGHT ANTECUBITAL Performed at [facility], [address], [place], [place] [address]    Special Requests   Final    BOTTLES DRAWN AEROBIC AND ANAEROBIC Blood Culture adequate volume Performed at [facility], [address] [place], [place] [address]    Culture  Setup Time   Final    GRAM POSITIVE COCCI IN BOTH AEROBIC AND ANAEROBIC BOTTLES CRITICAL RESULT CALLED TO, READ BACK BY AND VERIFIED WITH: MYRA SLAUGHTER ON 04/01/2019 AT 1344 QSD GRAM STAIN REVIEWED-AGREE WITH RESULT T. TYSOR    Culture (A)  Final    STAPHYLOCOCCUS EPIDERMIDIS  SUSCEPTIBILITIES TO FOLLOW Performed at [facility], [place] [address] [place], [place] [address]    Report Status PENDING  Incomplete  Blood Culture ID Panel (Reflexed)     Status: Abnormal   Collection Time: 03/31/19  8:43 PM  Result Value Ref Range Status   Enterococcus species NOT DETECTED NOT DETECTED Final   Listeria monocytogenes NOT DETECTED NOT DETECTED Final   Staphylococcus species DETECTED (A) NOT DETECTED Final    Comment: Methicillin (oxacillin) resistant coagulase negative staphylococcus. Possible blood culture contaminant (unless isolated from more than one blood culture draw or clinical case suggests pathogenicity). No antibiotic treatment is indicated for blood  culture contaminants. CRITICAL RESULT CALLED TO, READ BACK BY AND VERIFIED WITH: MYRA SLAUGHTER ON 04/01/2019 AT 1344 QSD    Staphylococcus aureus (BCID) NOT DETECTED NOT DETECTED Final   Methicillin resistance DETECTED (A) NOT DETECTED Final    Comment: CRITICAL RESULT CALLED TO, READ BACK BY AND VERIFIED WITH: MYRA SLAUGHTER ON 04/01/2019 AT 1344 QSD    Streptococcus species NOT DETECTED NOT DETECTED Final   Streptococcus agalactiae NOT DETECTED NOT DETECTED Final   Streptococcus  pneumoniae NOT DETECTED NOT DETECTED Final   Streptococcus pyogenes NOT DETECTED NOT DETECTED Final   Acinetobacter baumannii NOT DETECTED NOT DETECTED Final   Enterobacteriaceae species NOT DETECTED NOT DETECTED Final   Enterobacter cloacae complex NOT DETECTED NOT DETECTED Final   Escherichia coli NOT DETECTED NOT DETECTED Final   Klebsiella oxytoca NOT DETECTED NOT DETECTED Final   Klebsiella pneumoniae NOT DETECTED NOT DETECTED Final   Proteus species NOT DETECTED NOT DETECTED Final   Serratia marcescens NOT DETECTED NOT DETECTED Final   Haemophilus influenzae NOT DETECTED NOT DETECTED Final   Neisseria meningitidis NOT DETECTED NOT DETECTED Final   Pseudomonas aeruginosa NOT DETECTED NOT DETECTED Final   Candida  albicans NOT DETECTED NOT DETECTED Final   Candida glabrata NOT DETECTED NOT DETECTED Final   Candida krusei NOT DETECTED NOT DETECTED Final   Candida parapsilosis NOT DETECTED NOT DETECTED Final   Candida tropicalis NOT DETECTED NOT DETECTED Final    Comment: Performed at [facility] [address], [place], [place] [address]  Urine culture     Status: Abnormal   Collection Time: 03/31/19 10:13 PM   Specimen: Urine, Random  Result Value Ref Range Status   Specimen Description   Final    URINE, RANDOM Performed at [facility], [address] [place], [place] [address]    Special Requests   Final    NONE Performed at [facility], [address] [place], [place] [address]    Culture MULTIPLE SPECIES PRESENT, SUGGEST RECOLLECTION (A)  Final   Report Status 04/02/2019 FINAL  Final  MRSA PCR Screening     Status: None   Collection Time: 04/01/19  1:54 AM   Specimen: Nasopharyngeal  Result Value Ref Range Status   MRSA by PCR NEGATIVE NEGATIVE Final    Comment:        The GeneXpert MRSA Assay (FDA approved for NASAL specimens only), is one component of a comprehensive MRSA colonization surveillance program. It is not intended to diagnose MRSA infection nor to guide or monitor treatment for MRSA infections. Performed at [facility], [address] [place], [place] [address]   CULTURE, BLOOD (ROUTINE X 2) w Reflex to ID Panel     Status: None (Preliminary result)   Collection Time: 04/02/19  3:05 PM   Specimen: BLOOD RIGHT WRIST  Result Value Ref Range Status   Specimen Description BLOOD RIGHT WRIST  Final   Special Requests NONE  Final   Culture   Final    NO GROWTH < 24 HOURS Performed at [facility], [address] [place], [place] [address]    Report Status PENDING  Incomplete  Culture, blood (Routine X 2) w Reflex to ID Panel     Status: None (Preliminary result)   Collection Time: 04/02/19 10:45 PM   Specimen:  BLOOD  Result Value Ref Range Status   Specimen Description BLOOD RIGHT ASSIST CONTROL  Final   Special Requests   Final    BOTTLES DRAWN AEROBIC AND ANAEROBIC Blood Culture adequate volume   Culture   Final    NO GROWTH < 12 HOURS Performed at [facility], [address], [place], [place] [address]    Report Status PENDING  Incomplete   Studies/Results: Dg Chest Port 1 View  Result Date: 04/02/2019 CLINICAL DATA:  Respiratory failure.  History of lung cancer. EXAM: PORTABLE CHEST 1 VIEW COMPARISON:  04/01/2019.  03/31/2019. FINDINGS: NG tube in stable position. Stable cardiomegaly. Persistent progressed  bilateral pulmonary infiltrates/edema again noted. Interim bibasilar atelectasis. No pleural effusion or pneumothorax. IMPRESSION: 1.  NG tube stable position. 2.  Stable cardiomegaly 3. Persistent progressive bilateral pulmonary infiltrates/edema noted. Bibasilar atelectasis. Electronically Signed   By: Marcello Moores  Register   On: 04/02/2019 07:23   Medications: I have reviewed the patient's current medications. Scheduled Meds: . Chlorhexidine Gluconate Cloth  6 each Topical Q0600  . mouth rinse  15 mL Mouth Rinse BID  . multivitamins with iron  1 tablet Oral Daily  . pantoprazole  40 mg Oral BID AC  . sodium chloride flush  3 mL Intravenous Q12H  . thiamine  500 mg Oral Daily   Continuous Infusions: . ampicillin-sulbactam (UNASYN) IV 3 g (04/03/19 0935)  . dextrose 5 % with kcl 50 mL/hr at 04/03/19 0920  . vancomycin 1,000 mg (04/03/19 1201)   PRN Meds:.ipratropium-albuterol, LORazepam, [DISCONTINUED] ondansetron **OR** ondansetron (ZOFRAN) IV   Assessment: Active Problems:   GI bleed    Plan: This patient is being treated for aspiration pneumonia and bacteremia.  The patient had been found to have anemia.  He has a history of severe esophagitis.  The patient has had his NG tube removed.  No further GI intervention is needed at this time.  The patient should undergo a  colonoscopy at some time after discharge and be treated for his history of esophagitis with a PPI as an outpatient.  I will sign off.  Please call if any further GI concerns or questions.  We would like to thank you for the opportunity to participate in the care of [name].     LOS: 3 days   Khaniya Tenaglia 04/03/2019, 1:28 PM

## 2019-04-04 ENCOUNTER — Inpatient Hospital Stay: Payer: Self-pay

## 2019-04-04 LAB — VANCOMYCIN, PEAK: Vancomycin Pk: 25 ug/mL — ABNORMAL LOW (ref 30–40)

## 2019-04-04 LAB — CBC
HCT: 27.9 % — ABNORMAL LOW (ref 39.0–52.0)
Hemoglobin: 7.9 g/dL — ABNORMAL LOW (ref 13.0–17.0)
MCH: 22.4 pg — ABNORMAL LOW (ref 26.0–34.0)
MCHC: 28.3 g/dL — ABNORMAL LOW (ref 30.0–36.0)
MCV: 79 fL — ABNORMAL LOW (ref 80.0–100.0)
Platelets: 152 10*3/uL (ref 150–400)
RBC: 3.53 MIL/uL — ABNORMAL LOW (ref 4.22–5.81)
RDW: 27.5 % — ABNORMAL HIGH (ref 11.5–15.5)
WBC: 11.5 10*3/uL — ABNORMAL HIGH (ref 4.0–10.5)
nRBC: 0.7 % — ABNORMAL HIGH (ref 0.0–0.2)

## 2019-04-04 LAB — COMPREHENSIVE METABOLIC PANEL
ALT: 125 U/L — ABNORMAL HIGH (ref 0–44)
AST: 201 U/L — ABNORMAL HIGH (ref 15–41)
Albumin: 2.3 g/dL — ABNORMAL LOW (ref 3.5–5.0)
Alkaline Phosphatase: 201 U/L — ABNORMAL HIGH (ref 38–126)
Anion gap: 6 (ref 5–15)
BUN: 7 mg/dL — ABNORMAL LOW (ref 8–23)
CO2: 27 mmol/L (ref 22–32)
Calcium: 8.2 mg/dL — ABNORMAL LOW (ref 8.9–10.3)
Chloride: 99 mmol/L (ref 98–111)
Creatinine, Ser: 0.47 mg/dL — ABNORMAL LOW (ref 0.61–1.24)
GFR calc Af Amer: >60 mL/min (ref >60)
GFR calc non Af Amer: >60 mL/min (ref >60)
Glucose, Bld: 124 mg/dL — ABNORMAL HIGH (ref 70–99)
Potassium: 2.9 mmol/L — ABNORMAL LOW (ref 3.5–5.1)
Sodium: 132 mmol/L — ABNORMAL LOW (ref 135–145)
Total Bilirubin: 4.1 mg/dL — ABNORMAL HIGH (ref 0.3–1.2)
Total Protein: 5.1 g/dL — ABNORMAL LOW (ref 6.5–8.1)

## 2019-04-04 LAB — CULTURE, BLOOD (ROUTINE X 2)
Special Requests: ADEQUATE
Special Requests: ADEQUATE

## 2019-04-04 LAB — MAGNESIUM: Magnesium: 1.9 mg/dL (ref 1.7–2.4)

## 2019-04-04 LAB — POTASSIUM: Potassium: 3.4 mmol/L — ABNORMAL LOW (ref 3.5–5.1)

## 2019-04-04 MED ORDER — FOLIC ACID 1 MG PO TABS
1.0000 mg | ORAL_TABLET | Freq: Every day | ORAL | Status: DC
  Administered 2019-04-04 – 2019-04-05 (×2): 1 mg via ORAL
  Filled 2019-04-04 (×2): qty 1

## 2019-04-04 MED ORDER — LORAZEPAM 1 MG PO TABS: 1.0000 mg | ORAL_TABLET | Freq: Four times a day (QID) | ORAL | Status: AC | PRN

## 2019-04-04 MED ORDER — SODIUM CHLORIDE 0.9 % IV SOLN
INTRAVENOUS | Status: DC
  Administered 2019-04-04: 16:00:00 via INTRAVENOUS

## 2019-04-04 MED ORDER — ACETAMINOPHEN 325 MG PO TABS
650.0000 mg | ORAL_TABLET | Freq: Four times a day (QID) | ORAL | Status: DC | PRN
  Administered 2019-04-04 – 2019-04-17 (×4): 650 mg via ORAL
  Filled 2019-04-04 (×4): qty 2

## 2019-04-04 MED ORDER — VITAMIN B-1 100 MG PO TABS
100.0000 mg | ORAL_TABLET | Freq: Every day | ORAL | Status: DC
  Administered 2019-04-05: 100 mg via ORAL
  Filled 2019-04-04: qty 1

## 2019-04-04 MED ORDER — CALCIUM CARBONATE ANTACID 500 MG PO CHEW
2.0000 | CHEWABLE_TABLET | Freq: Three times a day (TID) | ORAL | Status: DC | PRN
  Filled 2019-04-04: qty 2

## 2019-04-04 MED ORDER — SODIUM CHLORIDE 0.9 % IV SOLN
12.5000 mg | Freq: Once | INTRAVENOUS | Status: AC
  Administered 2019-04-04: 12.5 mg via INTRAVENOUS
  Filled 2019-04-04: qty 0.5

## 2019-04-04 MED ORDER — ADULT MULTIVITAMIN W/MINERALS CH
1.0000 | ORAL_TABLET | Freq: Every day | ORAL | Status: DC
  Administered 2019-04-05 – 2019-04-16 (×10): 1 via ORAL
  Filled 2019-04-04 (×10): qty 1

## 2019-04-04 MED ORDER — LORAZEPAM 2 MG/ML IJ SOLN
1.0000 mg | Freq: Four times a day (QID) | INTRAMUSCULAR | Status: AC | PRN
  Administered 2019-04-05 – 2019-04-06 (×4): 1 mg via INTRAVENOUS
  Filled 2019-04-04 (×4): qty 1

## 2019-04-04 MED ORDER — IPRATROPIUM-ALBUTEROL 0.5-2.5 (3) MG/3ML IN SOLN
3.0000 mL | Freq: Four times a day (QID) | RESPIRATORY_TRACT | Status: DC
  Administered 2019-04-04 – 2019-04-09 (×17): 3 mL via RESPIRATORY_TRACT
  Filled 2019-04-04 (×18): qty 3

## 2019-04-04 MED ORDER — THIAMINE HCL 100 MG/ML IJ SOLN
100.0000 mg | Freq: Every day | INTRAMUSCULAR | Status: DC
  Administered 2019-04-06 – 2019-04-07 (×2): 100 mg via INTRAVENOUS
  Filled 2019-04-04 (×2): qty 2

## 2019-04-04 MED ORDER — POTASSIUM CHLORIDE CRYS ER 20 MEQ PO TBCR
40.0000 meq | EXTENDED_RELEASE_TABLET | Freq: Two times a day (BID) | ORAL | Status: AC
  Administered 2019-04-04 (×2): 40 meq via ORAL
  Filled 2019-04-04 (×2): qty 2

## 2019-04-04 MED ORDER — POTASSIUM CHLORIDE 10 MEQ/100ML IV SOLN
10.0000 meq | INTRAVENOUS | Status: AC
  Administered 2019-04-04 (×2): 10 meq via INTRAVENOUS
  Filled 2019-04-04 (×2): qty 100

## 2019-04-04 NOTE — Consult Note (Addendum)
PHARMACY CONSULT NOTE - FOLLOW UP  Pharmacy Consult for Electrolyte Monitoring and Replacement   Recent Labs: Potassium (mmol/L)  Date Value  04/04/2019 2.9 (L)   Magnesium (mg/dL)  Date Value  03/31/2019 2.2   Calcium (mg/dL)  Date Value  04/04/2019 8.2 (L)   Albumin (g/dL)  Date Value  04/04/2019 2.3 (L)   Sodium (mmol/L)  Date Value  04/04/2019 132 (L)   Corrected Calcium 9.56  Assessment: RQ is 62 YOM admitted for pneumonia and GI bleed on 03/31/19. Past medical history for alcohol abuse and patient is currently under CIWA protocol.  -Hypokalemic 3.5>3.9>3.2>2.9 -Hyponatremic 128>140>143>132 -Mg 2.2  Patient completed D5W solution w/40 mEq KCl on 6/24 at 0459. Patient has completed KCl 10 mEq IV x2 on 6/24 at 0857. Patient scheduled to dose 1 of 2 potassium 40 mEq po on 6/24 at 0852. Second dose of potassium 40 mEq po scheduled later today after lab draw.  Patient will have received 60 mEq before next lab draw.  Goal of Therapy:  Potassium 4 Magnesium 2  Plan: -Ordered labs for 6/24 at 1800 -Monitor sodium for correction:  -Continue potassium 40 mEq po as scheduled and adjust accordingly based on labs tonight -Monitor magnesium to stay within normal limits  Pharmacy will continue to monitor and adjust accordingly.  Marisa Cyphers ,PharmD 04/04/2019 11:42 AM   Lu Duffel, PharmD, BCPS Clinical Pharmacist 04/04/2019 12:45 PM

## 2019-04-04 NOTE — TOC Initial Note (Signed)
Transition of Care [facility]) - Initial/Assessment Note    Patient Details  Name: George Wade MRN: [id number] Date of Birth: 1955-12-10  Transition of [facility] CM/SW Contact:    Aziel Morgan, Lenice Llamas Phone Number: [phone]  04/04/2019, 3:33 PM  Clinical Narrative: PT is recommending SNF. Clinical Social Worker (CSW) met with patient to discuss D/C plan. Patient was alert and oriented X3 and was laying in the bed. CSW introduced self and explained role of CSW department. Per patient he lives alone in [place] and still drives. Per patient he has his own car and has no transportation concerns. Per patient he receives social security however he would not tell CSW how much he receives. CSW explained that in order to qualify for programs like home health patient would have to report his income. CSW explained that PT is recommending SNF however he has no insurance to pay for it. Per patient he does not want to go to SNF and reported that he is going home. Per patient he has a cane at home and reported that his friend [name] can check on him. Patient reported that he has [name]. Patient stated that he does drink alcohol however he does not see it as an issue for him. Per patient he does not want to give out his income information.             Expected Discharge Plan: [place] Barriers to Discharge: Continued Medical Work up   Patient Goals and CMS Choice Patient states their goals for this hospitalization and ongoing recovery are:: To feel better      Expected Discharge Plan and Services Expected Discharge Plan: [place] In-house Referral: Clinical Social Work Discharge Planning Services: CM Consult   Living arrangements for the past 2 months: [place]                                      Prior Living Arrangements/Services Living arrangements for the past 2 months: Single Family Home Lives with:: Self Patient language and need  for interpreter reviewed:: No Do you feel safe going back to the place where you live?: Yes      Need for Family Participation in Patient Care: Yes (Comment) Care giver support system in place?: No (comment) Current home services: DME(Patient has a cane at home.) Criminal Activity/Legal Involvement Pertinent to Current Situation/Hospitalization: No - Comment as needed  Activities of Daily Living      Permission Sought/Granted Permission sought to share information with : Case Manager Permission granted to share information with : Yes, Verbal Permission Granted              Emotional Assessment Appearance:: Appears older than stated age   Affect (typically observed): Guarded, Calm Orientation: : Oriented to Self, Oriented to Place, Oriented to  Time, Oriented to Situation Alcohol / Substance Use: Alcohol Use Psych Involvement: No (comment)  Admission diagnosis:  Dehydration [E86.0] Dyspnea [R06.00] Fall [W19.XXXA] Alcohol withdrawal syndrome with complication ([place]) [B35.329] Traumatic rhabdomyolysis, initial encounter ([place]) [T79.6XXA] Gastrointestinal hemorrhage, unspecified gastrointestinal hemorrhage type [K92.2] Patient Active Problem List   Diagnosis Date Noted  . GI bleed 03/31/2019  . Alcohol withdrawal ([place]) 12/09/2017   PCP:  Patient, [name] Per Pharmacy:   [facility] [address] [place] [address] Phone: [phone]  Fax: [phone]     Social Determinants of Health (SDOH) Interventions    Readmission Risk Interventions No flowsheet data found.

## 2019-04-04 NOTE — Consult Note (Signed)
PHARMACY CONSULT NOTE - FOLLOW UP  Pharmacy Consult for Electrolyte Monitoring and Replacement   Recent Labs: Potassium (mmol/L)  Date Value  04/04/2019 3.4 (L)   Magnesium (mg/dL)  Date Value  04/04/2019 1.9   Calcium (mg/dL)  Date Value  04/04/2019 8.2 (L)   Albumin (g/dL)  Date Value  04/04/2019 2.3 (L)   Sodium (mmol/L)  Date Value  04/04/2019 132 (L)   Corrected Calcium 9.56  Assessment: RQ is 62 YOM admitted for pneumonia and GI bleed on 03/31/19. Past medical history for alcohol abuse and patient is currently under CIWA protocol.  -Hypokalemic 3.5>3.9>3.2>2.9> 3.4 -Hyponatremic 128>140>143>132 -Mg 2.2 > 1.9  Patient completed D5W solution w/40 mEq KCl on 6/24 at 0459. Patient has completed KCl 10 mEq IV x2 on 6/24 at 0857. Patient scheduled to dose 1 of 2 potassium 40 mEq po on 6/24 at 0852. Second dose of potassium 40 mEq po scheduled later today after lab draw.  Patient will have received 60 mEq before next lab draw.  6/24@1841 : Potassium remains slightly hypokalemic. However, patient has an active order for KCL 40 mEq that is due at 2200.   Goal of Therapy:  Potassium 4 Magnesium 2  Plan: Will continue KCL 40 mEq PO that is due at 2200. Will recheck electrolytes with AM labs.   Pharmacy will continue to monitor and adjust accordingly.  Rowland Lathe ,PharmD 04/04/2019 6:40 PM

## 2019-04-04 NOTE — Progress Notes (Signed)
Around lunch time, Pt was noted to have a fever of 101.1, also he had body shakes. MD made aware and new orders were placed for CIWA protocol. Tylenol given and temp recheck was 99.7. HR in the 110s MD aware.  Pt not eating or drinking well due to coughing. Speech eval completed and pt is now on nectar liquids and puree foods.   Wynema Birch, RN

## 2019-04-04 NOTE — Progress Notes (Signed)
Pharmacy Antibiotic Note  George Wade is a 63 y.o. male admitted on 03/31/2019 with aspiration pneumonia and now with CoNS bacteremia.  Pharmacy has been consulted for Unasyn and vancomycin dosing. Since admission his Scr is back to baseline and leukocytosis has resolved but he remains encephalopathic with 3/4 positive blood cultures. - likely source could be the skin abrasion on both knees since he has B/L TKA. ID will evaluate later to see whether he needs TEE  6/24: VP @1528 - 25 mcg/mL Vancomycin peak is subtherapeutic. Last dose of vancomycin was @ 1354 on 6/24 and Vp was drawn @1528  (~1.5 hrs early). This does not given an accurate representation of peak. Will need to obtain another peak.   Plan: 1) continue Unasyn 3g IV q8h  2) continue vancomycin 1000 mg IV Q 12 h   Goal AUC 400-550.  Expected AUC: 445  SCr used: 0.80 mg/dL (rounded up)  T1/2:  8.7h  Obtain vancomycin levels with 5th Dose:    6/24: VP @0500                          6/25: VT @1330    Height: 5\' 9"  (175.3 cm) Weight: 172 lb (78 kg) IBW/kg (Calculated) : 70.7  Temp (24hrs), Avg:99.6 F (37.6 C), Min:98.6 F (37 C), Max:101.1 F (38.4 C)  Recent Labs  Lab 03/31/19 1950 03/31/19 2043 04/01/19 0302 04/02/19 0556 04/03/19 0422 04/03/19 1321 04/04/19 0307 04/04/19 1528  WBC 23.0*  --  15.4* 10.3 9.5  --  11.5*  --   CREATININE 1.54*  --  1.02 0.84 0.58*  --  0.47*  --   LATICACIDVEN  --  >11.0* 5.2* 1.2  --   --   --   --   VANCOTROUGH  --   --   --   --   --  33*  --   --   VANCOPEAK  --   --   --   --   --   --   --  25*    Estimated Creatinine Clearance: 95.7 mL/min (A) (by C-G formula based on SCr of 0.47 mg/dL (L)).    Microbiology 6/20 BCx 3/4 S epidermidis 6/21 MRSA PCR (-) 6/20 UCx recollect 6/20 SARS CoV-2 (-)  Antibiotics this Admission Unasyn 6/21 >> Vancomycin 6/22 >>  Thank you for allowing pharmacy to be a part of this patient's care.  Kristeen Miss, PharmD Clinical  Pharmacist 04/04/2019 4:54 PM

## 2019-04-04 NOTE — Evaluation (Signed)
Clinical/Bedside Swallow Evaluation Patient Details  Name: George Wade MRN: [id number] Date of Birth: 05-18-1956  Today's Date: 04/04/2019 Time: SLP Start Time (ACUTE ONLY): 1320 SLP Stop Time (ACUTE ONLY): 1415 SLP Time Calculation (min) (ACUTE ONLY): 55 min  Past Medical History:  Past Medical History:  Diagnosis Date  . ED (erectile dysfunction)   . Hypercholesteremia   . Hypertension   . Lung cancer [facility])    Past Surgical History:  Past Surgical History:  Procedure Laterality Date  . HERNIA REPAIR  1958  . REPLACEMENT TOTAL KNEE BILATERAL  2009  . TONSILLECTOMY  2008   HPI:  Pt is a 63 y.o. male who has a history of daily alcohol drinking and alcohol withdrawal, seizure in past who states he has been having diarrhea for the last month and dark vomit since yesterday. 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  This admission, he was brought to the emergency room by EMS services after being found outside his home.  Patient reports having laid in the rain for approximately 4 hours prior to EMS services being called by his neighbor.  He reports becoming progressively weak over the last month with diarrhea as well as nausea and vomiting over the last 2 days.  Pt seemed more congested since his arrival. Coughing and then spits out rust colored sputum.  On review of patient's prior records, he has a history of GI bleed with EGD performed at [facility] on 09/29/2018 finding severe ulcerating esophagitis of the entire mid and distal esophagus. There was no evidence of varices or active esophageal bleeding.  Currently, GI is following but unable to do endoscopy at this time secondary to pt's illness/status.  Pt has been more somnolent per MD notes.    Assessment / Plan /  Recommendation Clinical Impression  Pt appears to present w/ Moderate oropharyngeal phase dysphagia w/ risk for choking/aspiration impacted by a Moderate decline in pt's coordination of breathing w/ increased tremorous activity. Pt also exhibited hiccups, belching, and congested coughing at baseline PRIOR to po trials given at eval. Unsure if this presentation is related to his ETOH withdrawal. Pt appeared fatigued and required full assistance w/ positioning upright in bed. He was verbal giving attention to some Orientation questions and conversation accurately, but decreased awareness in general. He declined most po's but agreed to few trials w/ SLP.  Pt exhibited discoordinated respiratory support for calm inhalation/exhalation breathing w/ the impact also from hiccups and belching/cough; congested, nonproductive coughing. The discoordination in breathing impacted the timing of swallowing and apnea moment (suspected) resulting in potential aspiration when drinking thin liquids. He attempted to hold boluses orally to coordinate the timing of the swallow. Of the trials given, pt exhibited overt s/s of aspiration w/ the thin liquids(immediate coughing) but appeared to better coordinate the swallow w/ trials of Nectar liquids and purees - suspect d/t increased viscosity/texture allowing time for oral holding to prepare the A-P transfer/swallow. Pt only accepted a few trials total - no solids were assessed d/t this. Pt appeared easily fatigued and required rest break; he often closed eyes b/t trials.  Due to pt's declined Respiratory status and coordination of timing of breathing and swallowing, recommend modifying the diet to a Dysphagia 1(puree) w/ Nectar liquids; strict aspiration precautions; Reflux precautions; 100% supervision during meals, feeding support. ST services  will f/u w/ toleration of diet and trials to upgrade diet when safe, appropriate. MD/NSG updated on findings of this evaluation.  SLP Visit  Diagnosis: Dysphagia, oropharyngeal phase (R13.12)(impacted by )    Aspiration Risk  Moderate aspiration risk;Risk for inadequate nutrition/hydration    Diet Recommendation  Dysphagia level 1 (puree) w/ NECTAR consistency liquids; aspiration and Reflux precautions. Supervision and feeding support at meals.  Medication Administration: Crushed with puree(whole if able)    Other  Recommendations Recommended Consults: (Dietician f/u) Oral Care Recommendations: Oral care BID;Staff/trained caregiver to provide oral care Other Recommendations: Order thickener from pharmacy;Prohibited food (jello, ice cream, thin soups);Remove water pitcher;Have oral suction available   Follow up Recommendations Skilled Nursing facility(TBD)      Frequency and Duration min 3x week  2 weeks       Prognosis Prognosis for Safe Diet Advancement: Fair Barriers to Reach Goals: Severity of deficits      Swallow Study   General Date of Onset: 03/31/19 HPI: Pt is a 63 y.o. male who has a history of daily alcohol drinking and alcohol withdrawal, seizure in past who states he has been having diarrhea for the last month and dark vomit since yesterday. 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  This admission, he was brought to the emergency room by EMS services after being found outside his home.  Patient reports having laid in the rain for approximately 4 hours prior to EMS services being called by his neighbor.  He reports becoming progressively weak over the last month with diarrhea as well as nausea and vomiting over the last 2 days.  Pt seemed more congested since his arrival. Coughing and then spits out rust colored sputum.  On review of patient's prior records, he has a history of GI bleed with EGD performed at [facility] on 09/29/2018 finding severe ulcerating esophagitis of the entire mid and distal esophagus. There was no evidence of varices or active esophageal bleeding.  Currently, GI is following but unable to do endoscopy at this time secondary to pt's illness/status.  Pt has been more somnolent per MD notes.  Type of Study: Bedside Swallow Evaluation Previous Swallow Assessment: none Diet Prior to this Study: Regular;Thin liquids Temperature Spikes Noted: Yes(wbc 11.5) Respiratory Status: Nasal cannula(2 liters) History of Recent Intubation: No Behavior/Cognition: Alert;Cooperative;Pleasant mood;Requires cueing(min; decreased awareness overall but oriented to self/place) Oral Cavity Assessment: Within Functional Limits(grossly) Oral Care Completed by SLP: Recent completion by staff Oral Cavity - Dentition: Adequate natural dentition;Missing dentition(several) Vision: Functional for self-feeding Self-Feeding Abilities: Total assist(tremulous) Patient Positioning: Upright in bed(needed positioning) Baseline Vocal Quality: Low vocal intensity(mild Dysphonia) Volitional Cough: Congested Volitional Swallow: Able to elicit(given time)    Oral/Motor/Sensory Function Overall Oral Motor/Sensory Function: Mild impairment(decreased coordination; generalized weakness) Facial Symmetry: Within Functional Limits Lingual ROM: Within Functional Limits Lingual Symmetry: Within Functional Limits Lingual Strength: Within Functional Limits(fair)   Ice Chips Ice chips: Within functional limits(grossly) Presentation: Spoon(fed; 3 trials) Other Comments: grossly wfl w/ bolus management; though pt exhibited the decreased coordination in breathing and swallowing during trials   Thin Liquid Thin Liquid: Impaired Presentation: Cup;Self Fed;Straw(fully supported; 8 trials) Oral Phase Impairments: Reduced lingual movement/coordination Oral Phase Functional Implications: Prolonged oral  transit Pharyngeal  Phase Impairments: Cough - Immediate Other Comments: noted pt to have discoordinated respiratory support for  calm inhalation/exhalation w/ resulting hiccups and belching/cough; also similar baseline congested coughing. The discoordination in breathing impacted the timing of swallowing and apnea moment(suspected) resulting in potential aspiration when drinking thin liquids. He attempted to hold boluses orally to coordinate the timing of the swallow.     Nectar Thick Nectar Thick Liquid: Within functional limits(grossly) Presentation: Self Fed;Straw(fully supported; 3 trials) Other Comments: pt denied any further; he exhibited decreased coordination in breathing and swallowing during the trials   Honey Thick Honey Thick Liquid: Not tested   Puree Puree: Within functional limits(grossly) Presentation: Spoon(fed; 5 trials) Other Comments: pt denied any further; he exhibited decreased coordination in breathing and swallowing during the trials   Solid     Solid: Not tested       Orinda Kenner, MS, CCC-SLP Watson,Katherine 04/04/2019,4:43 PM

## 2019-04-04 NOTE — Progress Notes (Addendum)
[facility] [place] NAME: George Wade    MR#:  [id number]  DATE OF BIRTH:  1956/08/02  SUBJECTIVE:   Patient states he is feeling okay this morning.  He does feel like he is more tremulous today.  He also notes some mild shortness of breath this morning.  Per RN, patient had a choking episode while taking his morning medicines.  REVIEW OF SYSTEMS:  Review of Systems  Constitutional: Positive for malaise/fatigue. Negative for chills and fever.  HENT: Negative for congestion and sore throat.   Eyes: Negative for blurred vision and double vision.  Respiratory: Positive for shortness of breath. Negative for cough.   Cardiovascular: Negative for chest pain and palpitations.  Gastrointestinal: Negative for nausea and vomiting.  Genitourinary: Negative for dysuria and urgency.  Musculoskeletal: Negative for back pain and neck pain.  Neurological: Positive for tremors and weakness. Negative for dizziness, focal weakness and headaches.  Psychiatric/Behavioral: Negative for depression. The patient is not nervous/anxious.     DRUG ALLERGIES:  No Known Allergies VITALS:  Blood pressure 112/87, pulse (!) 121, temperature (!) 101.1 F (38.4 C), temperature source Oral, resp. rate (!) 24, height 5\' 9"  (1.753 m), weight 78 kg, SpO2 93 %. PHYSICAL EXAMINATION:  Physical Exam  GENERAL:  Laying in the bed with no acute distress. HEENT: Head atraumatic, normocephalic. Pupils equal, round, reactive to light and accommodation. No scleral icterus. Extraocular muscles intact. Oropharynx and nasopharynx clear. NECK:  Supple, no jugular venous distention. No thyroid enlargement. LUNGS: Lungs are clear to auscultation bilaterally. No wheezes, crackles, rhonchi. No use of accessory muscles of respiration.  CARDIOVASCULAR: Tachycardic, regular rhythm,, S1, S2 normal. No murmurs, rubs, or gallops.  ABDOMEN: Soft, nontender, nondistended. Bowel sounds present.   EXTREMITIES: No pedal edema, cyanosis, or clubbing.  NEUROLOGIC: CN 2-12 intact, no focal deficits. +global weakness. Sensation intact throughout. Gait not checked. +tremor PSYCHIATRIC: The patient is alert and oriented x 3.  SKIN: No obvious rash, lesion, or ulcer. Skin abrasions present on both knees. Multiple bruises present. LABORATORY PANEL:  Male CBC Recent Labs  Lab 04/04/19 0307  WBC 11.5*  HGB 7.9*  HCT 27.9*  PLT 152   ------------------------------------------------------------------------------------------------------------------ Chemistries  Recent Labs  Lab 03/31/19 1950  04/04/19 0307  NA 128*   < > 132*  K 3.2*   < > 2.9*  CL 78*   < > 99  CO2 12*   < > 27  GLUCOSE 82   < > 124*  BUN 17   < > 7*  CREATININE 1.54*   < > 0.47*  CALCIUM 13.4*   < > 8.2*  MG 2.2  --   --   AST 213*   < > 201*  ALT 98*   < > 125*  ALKPHOS 280*   < > 201*  BILITOT 1.9*   < > 4.1*   < > = values in this interval not displayed.   RADIOLOGY:  No results found. ASSESSMENT AND PLAN:   GI bleed- likely due to PUD. Patient had recent EGD with PUD and gastritis. -GI following- recommend outpatient colonoscopy -Has received IV Protonix, will transition to p.o. Protonix today  Acute blood loss anemia- hemoglobin stable s/p 2 units PRBC -Serial H/H -Transfuse for hemoglobin < 7  Coag-negative Staph bacteremia- likely source is skin abrasions on bilateral knees. -Continue vancomycin -ID consult- recommend TTE and if negative, patient will need TEE -Repeat blood cultures 6/22 with no growth  to date  Aspiration pneumonia- improving, now on room air -Continue unasyn -SLP eval pending  Hypokalemia -Replete and recheck  Acute rhabdomyolysis- resolved with IVFs -IV fluids  Alcohol abuse- LFTs elevated -CIWA protocol  -IV thiamine and folic acid -Alcohol cessation counseling performed this admission  DVT prophylaxis- SCDs.  All the records are reviewed and case  discussed with Care Management/Social Worker. Management plans discussed with the patient, family and they are in agreement.  CODE STATUS: Full Code  TOTAL TIME TAKING CARE OF THIS PATIENT: 33 minutes.   More than 50% of the time was spent in counseling/coordination of care: YES  POSSIBLE D/C IN 1-2 DAYS, DEPENDING ON CLINICAL CONDITION.   [name] Mayo M.D on 04/04/2019 at 2:19 PM  Between 7am to 6pm - Pager [phone]  After 6pm go to [email or web address] - Proofreader  [facility]  Office  [phone]  CC: Primary care physician; Patient, [name] Per  Note: This dictation was prepared with Dragon dictation along with smaller phrase technology. Any transcriptional errors that result from this process are unintentional.

## 2019-04-05 ENCOUNTER — Inpatient Hospital Stay: Payer: Self-pay

## 2019-04-05 ENCOUNTER — Inpatient Hospital Stay (HOSPITAL_COMMUNITY)
Admit: 2019-04-05 | Discharge: 2019-04-05 | Disposition: A | Discharge status: Home / Self Care | Payer: Self-pay | Attending: Internal Medicine | Admitting: Internal Medicine

## 2019-04-05 ENCOUNTER — Other Ambulatory Visit: Payer: Self-pay

## 2019-04-05 DIAGNOSIS — R7881 Bacteremia: Secondary | ICD-10-CM

## 2019-04-05 DIAGNOSIS — G312 Degeneration of nervous system due to alcohol: Secondary | ICD-10-CM

## 2019-04-05 DIAGNOSIS — R0603 Acute respiratory distress: Secondary | ICD-10-CM

## 2019-04-05 DIAGNOSIS — R945 Abnormal results of liver function studies: Secondary | ICD-10-CM

## 2019-04-05 DIAGNOSIS — D72829 Elevated white blood cell count, unspecified: Secondary | ICD-10-CM

## 2019-04-05 DIAGNOSIS — Z8711 Personal history of peptic ulcer disease: Secondary | ICD-10-CM

## 2019-04-05 DIAGNOSIS — D649 Anemia, unspecified: Secondary | ICD-10-CM

## 2019-04-05 DIAGNOSIS — Z9049 Acquired absence of other specified parts of digestive tract: Secondary | ICD-10-CM

## 2019-04-05 DIAGNOSIS — B957 Other staphylococcus as the cause of diseases classified elsewhere: Secondary | ICD-10-CM

## 2019-04-05 DIAGNOSIS — R509 Fever, unspecified: Secondary | ICD-10-CM

## 2019-04-05 LAB — BASIC METABOLIC PANEL
Anion gap: 9 (ref 5–15)
BUN: <5 mg/dL — ABNORMAL LOW (ref 8–23)
CO2: 24 mmol/L (ref 22–32)
Calcium: 7.9 mg/dL — ABNORMAL LOW (ref 8.9–10.3)
Chloride: 104 mmol/L (ref 98–111)
Creatinine, Ser: 0.39 mg/dL — ABNORMAL LOW (ref 0.61–1.24)
GFR calc Af Amer: >60 mL/min (ref >60)
GFR calc non Af Amer: >60 mL/min (ref >60)
Glucose, Bld: 110 mg/dL — ABNORMAL HIGH (ref 70–99)
Potassium: 3.7 mmol/L (ref 3.5–5.1)
Sodium: 137 mmol/L (ref 135–145)

## 2019-04-05 LAB — VANCOMYCIN, PEAK: Vancomycin Pk: 14 ug/mL — ABNORMAL LOW (ref 30–40)

## 2019-04-05 LAB — CBC
HCT: 29.9 % — ABNORMAL LOW (ref 39.0–52.0)
Hemoglobin: 8.4 g/dL — ABNORMAL LOW (ref 13.0–17.0)
MCH: 22.7 pg — ABNORMAL LOW (ref 26.0–34.0)
MCHC: 28.1 g/dL — ABNORMAL LOW (ref 30.0–36.0)
MCV: 80.8 fL (ref 80.0–100.0)
Platelets: 206 10*3/uL (ref 150–400)
RBC: 3.7 MIL/uL — ABNORMAL LOW (ref 4.22–5.81)
RDW: 27.9 % — ABNORMAL HIGH (ref 11.5–15.5)
WBC: 16.5 10*3/uL — ABNORMAL HIGH (ref 4.0–10.5)
nRBC: 0.6 % — ABNORMAL HIGH (ref 0.0–0.2)

## 2019-04-05 LAB — ECHOCARDIOGRAM COMPLETE
Height: 69 in
Weight: 2752 oz

## 2019-04-05 LAB — PHOSPHORUS: Phosphorus: 1.3 mg/dL — ABNORMAL LOW (ref 2.5–4.6)

## 2019-04-05 LAB — MAGNESIUM: Magnesium: 1.9 mg/dL (ref 1.7–2.4)

## 2019-04-05 MED ORDER — AMLODIPINE BESYLATE 5 MG PO TABS
5.0000 mg | ORAL_TABLET | Freq: Every day | ORAL | Status: DC
  Administered 2019-04-05: 14:00:00 5 mg via ORAL
  Filled 2019-04-05: qty 1

## 2019-04-05 MED ORDER — POTASSIUM PHOSPHATES 15 MMOLE/5ML IV SOLN: 40.0000 meq | Freq: Once | INTRAVENOUS | Status: DC

## 2019-04-05 MED ORDER — VANCOMYCIN HCL 1.25 G IV SOLR
1250.0000 mg | Freq: Two times a day (BID) | INTRAVENOUS | Status: DC
  Administered 2019-04-05 – 2019-04-07 (×5): 1250 mg via INTRAVENOUS
  Filled 2019-04-05 (×7): qty 1250

## 2019-04-05 MED ORDER — POTASSIUM PHOSPHATES 15 MMOLE/5ML IV SOLN
20.0000 mmol | Freq: Once | INTRAVENOUS | Status: AC
  Administered 2019-04-05: 10:00:00 20 mmol via INTRAVENOUS
  Filled 2019-04-05: qty 6.67

## 2019-04-05 NOTE — Progress Notes (Signed)
Pharmacy Antibiotic Note  George Wade is a 63 y.o. male admitted on 03/31/2019 with aspiration pneumonia and now with CoNS bacteremia.  Pharmacy has been consulted for Unasyn and vancomycin dosing. Since admission his Scr is back to baseline and leukocytosis has resolved but he remains encephalopathic with 3/4 positive blood cultures. - likely source could be the skin abrasion on both knees since he has B/L TKA. ID will evaluate later to see whether he needs TEE  6/24: VP @1528 - 25 mcg/mL Vancomycin peak is subtherapeutic. Last dose of vancomycin was @ 1354 on 6/24 and Vp was drawn @1528  (~1.5 hrs early). This does not given an accurate representation of peak. Will need to obtain another peak.   Plan: 1) continue Unasyn 3g IV q8h  2) Vancomycin peak: 14. Level appears to be drawn correctly. Will adjust vancomycin dose to vancomycin 1250 mg IV Q 12 h   Goal AUC 400-550.  Expected AUC: 530  SCr used: 0.80 mg/dL (rounded up)   IF plan is to continue patient on vancomycin - will obtain vancomycin levels with 5th Dose  Height: 5\' 9"  (175.3 cm) Weight: 172 lb (78 kg) IBW/kg (Calculated) : 70.7  Temp (24hrs), Avg:99.7 F (37.6 C), Min:98.5 F (36.9 C), Max:101.1 F (38.4 C)  Recent Labs  Lab 03/31/19 1950 03/31/19 2043 04/01/19 0302 04/02/19 0556 04/03/19 0422 04/03/19 1321 04/04/19 0307 04/04/19 1528 04/05/19 0424  WBC 23.0*  --  15.4* 10.3 9.5  --  11.5*  --  16.5*  CREATININE 1.54*  --  1.02 0.84 0.58*  --  0.47*  --   --   LATICACIDVEN  --  >11.0* 5.2* 1.2  --   --   --   --   --   VANCOTROUGH  --   --   --   --   --  33*  --   --   --   VANCOPEAK  --   --   --   --   --   --   --  25* 14*    Estimated Creatinine Clearance: 95.7 mL/min (A) (by C-G formula based on SCr of 0.47 mg/dL (L)).    Microbiology 6/20 BCx 3/4 S epidermidis 6/21 MRSA PCR (-) 6/20 UCx recollect 6/20 SARS CoV-2 (-)  Antibiotics this Admission Unasyn 6/21 >> Vancomycin 6/22 >>  Thank you for  allowing pharmacy to be a part of this patient's care.  Pernell Dupre, PharmD, BCPS Clinical Pharmacist 04/05/2019 5:15 AM

## 2019-04-05 NOTE — Progress Notes (Signed)
[name], male   DOB: 1955-12-12, 63 y.o.   MRN: [id number]  Spoke with friend for 45 years.  The patient is living in his RV on his property.  The patient has been in rehabs numerous times.  Does not want him to come back to the RV.   Dr Leslye Peer

## 2019-04-05 NOTE — Progress Notes (Signed)
MD notified: would you like to stop NS running at 6ml/hr. Right lung sounds wet and coughs when being feed.

## 2019-04-05 NOTE — Progress Notes (Signed)
MD notified: Temp 101.1, HR 111, BP 149/93, Resp 24, O2 93% on 2L.  Tylenol given. 1mg  of Ativan given for anxiety, tremors, hallusinations. The patient reports seeing a little girl in the room and talks to her.

## 2019-04-05 NOTE — Progress Notes (Signed)
*  PRELIMINARY RESULTS* Echocardiogram 2D Echocardiogram has been performed.  George Wade 04/05/2019, 10:36 AM

## 2019-04-05 NOTE — Progress Notes (Signed)
[name], male   DOB: 12/07/1955, 63 y.o.   MRN: [id number]  [facility] PROGRESS NOTE  George Wade OEV:[id number] DOB: 1956/01/06 DOA: 03/31/2019 PCP: Patient, [name] Per  HPI/Subjective: Patient feels okay.  Offers no complaints.  As per nurse he is having hallucinations.  I was talking with him and then he went to sleep.  His only contact in the computer is a friend and I left a message to try to get some more information.  Objective: Vitals:   04/05/19 1200 04/05/19 1231  BP: (!) 126/96   Pulse: (!) 103   Resp: (!) 28 (!) 24  Temp: 100.1 F (37.8 C)   SpO2: 95%     Filed Weights   03/31/19 1912  Weight: 78 kg    ROS: Review of Systems  Unable to perform ROS: Acuity of condition  Respiratory: Positive for cough and shortness of breath.   Cardiovascular: Negative for chest pain.  Gastrointestinal: Negative for abdominal pain.   Exam: Physical Exam  HENT:  Nose: No mucosal edema.  Mouth/Throat: No oropharyngeal exudate or posterior oropharyngeal edema.  Eyes: Pupils are equal, round, and reactive to light. Conjunctivae, EOM and lids are normal.  Neck: No JVD present. Carotid bruit is not present. No edema present. No thyroid mass and no thyromegaly present.  Cardiovascular: S1 normal and S2 normal. Exam reveals no gallop.  No murmur heard. Pulses:      Dorsalis pedis pulses are 2+ on the right side and 2+ on the left side.  Respiratory: No respiratory distress. He has no wheezes. He has no rhonchi. He has no rales.  GI: Soft. Bowel sounds are normal. There is no abdominal tenderness.  Musculoskeletal:     Right shoulder: He exhibits no swelling.  Lymphadenopathy:    He has no cervical adenopathy.  Neurological: He is alert. No cranial nerve deficit.  Skin: Skin is warm. No rash noted. Nails show no clubbing.  Psychiatric: He has a normal mood and affect.      Data Reviewed: Basic Metabolic Panel: Recent Labs  Lab 03/31/19 1950  04/01/19 0302 04/02/19 0556 04/03/19 0422 04/04/19 0307 04/04/19 1754 04/05/19 0424  NA 128* 128* 140 143 132*  --  137  K 3.2* 3.5 3.9 3.2* 2.9* 3.4* 3.7  CL 78* 87* 105 105 99  --  104  CO2 12* 27 25 29 27   --  24  GLUCOSE 82 120* 121* 113* 124*  --  110*  BUN 17 19 22 18  7*  --  <5*  CREATININE 1.54* 1.02 0.84 0.58* 0.47*  --  0.39*  CALCIUM 13.4* 10.5* 9.5 9.1 8.2*  --  7.9*  MG 2.2  --   --   --   --  1.9 1.9  PHOS  --   --   --   --   --   --  1.3*   Liver Function Tests: Recent Labs  Lab 03/31/19 1950 04/02/19 0556 04/04/19 0307  AST 213* 207* 201*  ALT 98* 98* 125*  ALKPHOS 280* 166* 201*  BILITOT 1.9* 2.2* 4.1*  PROT 6.3* 5.0* 5.1*  ALBUMIN 2.9* 2.3* 2.3*   CBC: Recent Labs  Lab 03/31/19 1950 04/01/19 0302  04/01/19 1706 04/02/19 0556 04/03/19 0422 04/04/19 0307 04/05/19 0424  WBC 23.0* 15.4*  --   --  10.3 9.5 11.5* 16.5*  NEUTROABS 22.1*  --   --   --   --   --   --   --  HGB 6.3* 6.9*   < > 7.2* 8.0* 8.0* 7.9* 8.4*  HCT 25.7* 25.1*   < > 25.2* 28.9* 29.4* 27.9* 29.9*  MCV 80.6 78.0*  --   --  81.6 83.3 79.0* 80.8  PLT 386 206  --   --  187 148* 152 206   < > = values in this interval not displayed.   Cardiac Enzymes: Recent Labs  Lab 03/31/19 1950 04/01/19 0302 04/02/19 0556  CKTOTAL 1,674* 1,017* 347  TROPONINI 0.04*  --   --     CBG: Recent Labs  Lab 04/02/19 0155 04/02/19 0604 04/02/19 1139 04/03/19 0009 04/03/19 0603  GLUCAP 103* 121* 118* 107* 104*    Recent Results (from the past 240 hour(s))  SARS Coronavirus 2 (CEPHEID - Performed in [place] hospital lab), Hosp Order     Status: None   Collection Time: 03/31/19  8:43 PM   Specimen: Nasopharyngeal Swab  Result Value Ref Range Status   SARS Coronavirus 2 NEGATIVE NEGATIVE Final    Comment: (NOTE) If result is NEGATIVE SARS-CoV-2 target nucleic acids are NOT DETECTED. The SARS-CoV-2 RNA is generally detectable in upper and lower  respiratory specimens during the  acute phase of infection. The lowest  concentration of SARS-CoV-2 viral copies this assay can detect is 250  copies / mL. A negative result does not preclude SARS-CoV-2 infection  and should not be used as the sole basis for treatment or other  patient management decisions.  A negative result may occur with  improper specimen collection / handling, submission of specimen other  than nasopharyngeal swab, presence of viral mutation(s) within the  areas targeted by this assay, and inadequate number of viral copies  (<250 copies / mL). A negative result must be combined with clinical  observations, patient history, and epidemiological information. If result is POSITIVE SARS-CoV-2 target nucleic acids are DETECTED. The SARS-CoV-2 RNA is generally detectable in upper and lower  respiratory specimens dur ing the acute phase of infection.  Positive  results are indicative of active infection with SARS-CoV-2.  Clinical  correlation with patient history and other diagnostic information is  necessary to determine patient infection status.  Positive results do  not rule out bacterial infection or co-infection with other viruses. If result is PRESUMPTIVE POSTIVE SARS-CoV-2 nucleic acids MAY BE PRESENT.   A presumptive positive result was obtained on the submitted specimen  and confirmed on repeat testing.  While 2019 novel coronavirus  (SARS-CoV-2) nucleic acids may be present in the submitted sample  additional confirmatory testing may be necessary for epidemiological  and / or clinical management purposes  to differentiate between  SARS-CoV-2 and other Sarbecovirus currently known to infect humans.  If clinically indicated additional testing with an alternate test  methodology [phone]) is advised. The SARS-CoV-2 RNA is generally  detectable in upper and lower respiratory sp ecimens during the acute  phase of infection. The expected result is Negative. Fact Sheet for Patients:   StrictlyIdeas.no Fact Sheet for Healthcare Providers: BankingDealers.co.za This test is not yet approved or cleared by the [place] FDA and has been authorized for detection and/or diagnosis of SARS-CoV-2 by FDA under an Emergency Use Authorization (EUA).  This EUA will remain in effect (meaning this test can be used) for the duration of the COVID-19 declaration under Section 564(b)(1) of the Act, 21 U.S.C. section 360bbb-3(b)(1), unless the authorization is terminated or revoked sooner. Performed at [facility], [address] [place], [place] [address]  Culture, blood (routine x 2)     Status: Abnormal   Collection Time: 03/31/19  8:43 PM   Specimen: BLOOD  Result Value Ref Range Status   Specimen Description   Final    BLOOD LEFT ANTECUBITAL Performed at [facility], [address] [place], [place] [address]    Special Requests   Final    BOTTLES DRAWN AEROBIC AND ANAEROBIC Blood Culture adequate volume Performed at [facility], [address], [place], [place] [address]    Culture  Setup Time   Final    GRAM POSITIVE COCCI AEROBIC BOTTLE ONLY CRITICAL VALUE NOTED.  VALUE IS CONSISTENT WITH PREVIOUSLY REPORTED AND CALLED VALUE. [place] Performed at [facility], [place] [address], [place], [place] [address]    Culture STAPHYLOCOCCUS EPIDERMIDIS (A)  Final   Report Status 04/04/2019 FINAL  Final   Organism ID, Bacteria STAPHYLOCOCCUS EPIDERMIDIS  Final      Susceptibility   Staphylococcus epidermidis - MIC*    CIPROFLOXACIN <=0.5 SENSITIVE Sensitive     ERYTHROMYCIN >=8 RESISTANT Resistant     GENTAMICIN <=0.5 SENSITIVE Sensitive     OXACILLIN >=4 RESISTANT Resistant     TETRACYCLINE <=1 SENSITIVE Sensitive     VANCOMYCIN 2 SENSITIVE Sensitive     TRIMETH/SULFA <=10 SENSITIVE Sensitive     CLINDAMYCIN <=0.25 SENSITIVE Sensitive     RIFAMPIN  <=0.5 SENSITIVE Sensitive     Inducible Clindamycin NEGATIVE Sensitive     * STAPHYLOCOCCUS EPIDERMIDIS  Culture, blood (routine x 2)     Status: Abnormal   Collection Time: 03/31/19  8:43 PM   Specimen: BLOOD  Result Value Ref Range Status   Specimen Description   Final    BLOOD RIGHT ANTECUBITAL Performed at [facility], [address], [place], [place] [address]    Special Requests   Final    BOTTLES DRAWN AEROBIC AND ANAEROBIC Blood Culture adequate volume Performed at [facility], [address] [place], [place] [address]    Culture  Setup Time   Final    GRAM POSITIVE COCCI IN BOTH AEROBIC AND ANAEROBIC BOTTLES CRITICAL RESULT CALLED TO, READ BACK BY AND VERIFIED WITH: MYRA SLAUGHTER ON 04/01/2019 AT 1344 QSD GRAM STAIN REVIEWED-AGREE WITH RESULT T. TYSOR    Culture (A)  Final    STAPHYLOCOCCUS EPIDERMIDIS SUSCEPTIBILITIES PERFORMED ON PREVIOUS CULTURE WITHIN THE LAST 5 DAYS. Performed at [facility], [place] [address], [place], [place] [address]    Report Status 04/04/2019 FINAL  Final  Blood Culture ID Panel (Reflexed)     Status: Abnormal   Collection Time: 03/31/19  8:43 PM  Result Value Ref Range Status   Enterococcus species NOT DETECTED NOT DETECTED Final   Listeria monocytogenes NOT DETECTED NOT DETECTED Final   Staphylococcus species DETECTED (A) NOT DETECTED Final    Comment: Methicillin (oxacillin) resistant coagulase negative staphylococcus. Possible blood culture contaminant (unless isolated from more than one blood culture draw or clinical case suggests pathogenicity). No antibiotic treatment is indicated for blood  culture contaminants. CRITICAL RESULT CALLED TO, READ BACK BY AND VERIFIED WITH: MYRA SLAUGHTER ON 04/01/2019 AT 1344 QSD    Staphylococcus aureus (BCID) NOT DETECTED NOT DETECTED Final   Methicillin resistance DETECTED (A) NOT DETECTED Final    Comment: CRITICAL RESULT CALLED TO, READ BACK BY AND VERIFIED WITH: MYRA  SLAUGHTER ON 04/01/2019 AT 1344 QSD    Streptococcus species NOT DETECTED NOT DETECTED Final  Streptococcus agalactiae NOT DETECTED NOT DETECTED Final   Streptococcus pneumoniae NOT DETECTED NOT DETECTED Final   Streptococcus pyogenes NOT DETECTED NOT DETECTED Final   Acinetobacter baumannii NOT DETECTED NOT DETECTED Final   Enterobacteriaceae species NOT DETECTED NOT DETECTED Final   Enterobacter cloacae complex NOT DETECTED NOT DETECTED Final   Escherichia coli NOT DETECTED NOT DETECTED Final   Klebsiella oxytoca NOT DETECTED NOT DETECTED Final   Klebsiella pneumoniae NOT DETECTED NOT DETECTED Final   Proteus species NOT DETECTED NOT DETECTED Final   Serratia marcescens NOT DETECTED NOT DETECTED Final   Haemophilus influenzae NOT DETECTED NOT DETECTED Final   Neisseria meningitidis NOT DETECTED NOT DETECTED Final   Pseudomonas aeruginosa NOT DETECTED NOT DETECTED Final   Candida albicans NOT DETECTED NOT DETECTED Final   Candida glabrata NOT DETECTED NOT DETECTED Final   Candida krusei NOT DETECTED NOT DETECTED Final   Candida parapsilosis NOT DETECTED NOT DETECTED Final   Candida tropicalis NOT DETECTED NOT DETECTED Final    Comment: Performed at [facility], [address] [place], [place] [address]  Urine culture     Status: Abnormal   Collection Time: 03/31/19 10:13 PM   Specimen: Urine, Random  Result Value Ref Range Status   Specimen Description   Final    URINE, RANDOM Performed at [facility], [address], [place], [place] [address]    Special Requests   Final    NONE Performed at [facility], [address] [place], [place] [address]    Culture MULTIPLE SPECIES PRESENT, SUGGEST RECOLLECTION (A)  Final   Report Status 04/02/2019 FINAL  Final  MRSA PCR Screening     Status: None   Collection Time: 04/01/19  1:54 AM   Specimen: Nasopharyngeal  Result Value Ref Range Status   MRSA by PCR NEGATIVE NEGATIVE Final    Comment:         The GeneXpert MRSA Assay (FDA approved for NASAL specimens only), is one component of a comprehensive MRSA colonization surveillance program. It is not intended to diagnose MRSA infection nor to guide or monitor treatment for MRSA infections. Performed at [facility], [address] [place], [place] [address]   CULTURE, BLOOD (ROUTINE X 2) w Reflex to ID Panel     Status: None (Preliminary result)   Collection Time: 04/02/19  3:05 PM   Specimen: BLOOD RIGHT WRIST  Result Value Ref Range Status   Specimen Description BLOOD RIGHT WRIST  Final   Special Requests NONE  Final   Culture   Final    NO GROWTH 2 DAYS Performed at [facility], [address], [place], [place] [address]    Report Status PENDING  Incomplete  Culture, blood (Routine X 2) w Reflex to ID Panel     Status: None (Preliminary result)   Collection Time: 04/02/19 10:45 PM   Specimen: BLOOD  Result Value Ref Range Status   Specimen Description BLOOD RIGHT ASSIST CONTROL  Final   Special Requests   Final    BOTTLES DRAWN AEROBIC AND ANAEROBIC Blood Culture adequate volume   Culture   Final    NO GROWTH 2 DAYS Performed at [facility], [address], [place], [place] [address]    Report Status PENDING  Incomplete     Studies: Dg Chest 1 View  Result Date: 04/04/2019 CLINICAL DATA:  Fever. Admitted for gastrointestinal bleeding. History of lung cancer and hypertension. EXAM: CHEST  1 VIEW COMPARISON:  Radiographs  04/02/2019, 04/01/2019 and 03/31/2019. FINDINGS: 1358 hours. The enteric tube has been removed. The heart size and mediastinal contours are stable. There are persistent low lung volumes with bibasilar infiltrates. Overall aeration of the right lung base has mildly improved. There is no pneumothorax or significant pleural effusion. The bones appear unchanged. IMPRESSION: Persistent bibasilar infiltrates suspicious for pneumonia, possibly on the basis of aspiration. Overall  right basilar aeration has mildly improved. Electronically Signed   By: Richardean Sale M.D.   On: 04/04/2019 15:56    Scheduled Meds: . amLODipine  5 mg Oral Daily  . folic acid  1 mg Oral Daily  . ipratropium-albuterol  3 mL Nebulization Q6H  . mouth rinse  15 mL Mouth Rinse BID  . multivitamin with minerals  1 tablet Oral Daily  . pantoprazole  40 mg Oral BID AC  . sodium chloride flush  3 mL Intravenous Q12H  . thiamine  100 mg Oral Daily   Or  . thiamine  100 mg Intravenous Daily   Continuous Infusions: . sodium chloride 100 mL (04/05/19 1026)  . sodium chloride 75 mL/hr at 04/04/19 1558  . ampicillin-sulbactam (UNASYN) IV 3 g (04/05/19 0615)  . potassium PHOSPHATE IVPB (in mmol) 20 mmol (04/05/19 1027)  . vancomycin      Assessment/Plan:  1. Staph epidermidis bacteremia on vancomycin.  Echocardiogram results still pending.  If this is negative will need a TEE.  With repeat temperature today I will repeat blood cultures.  Potential for prolonged antibiotics mentioned in rounds. 2. Aspiration pneumonia on Unasyn.  Repeat chest x-ray today with fever. 3. Acute blood loss anemia with GI bleed.  Recent endoscopy with peptic ulcer disease and gastritis.  On Protonix. 4. Hypokalemia and hypophosphatemia.  K-Phos given 5. Acute rhabdomyolysis improved with IV fluids 6. Alcohol withdrawal with hallucinations on alcohol withdrawal protocol.  Code Status:     Code Status Orders  (From admission, onward)         Start     Ordered   03/31/19 2341  Full code  Continuous     03/31/19 2340        Code Status History    Date Active Date Inactive Code Status Order ID Comments User Context   12/09/2017 1336 12/12/2017 1845 Full Code [id number]  Dustin Flock, MD Inpatient   Advance Care Planning Activity     Family Communication: Tried to reach friend who is the main contact in the computer Disposition Plan: No plan at this point in time  Consultants:  Infectious  disease  Antibiotics:  Vancomycin  Unasyn  Time spent: 28 minutes  [place]

## 2019-04-05 NOTE — Progress Notes (Signed)
    Cardiology consulted for TEE in the setting of staph epidermidis bacteremia. 2D echo was a very limited study secondary to patient incoherence/noncompliance. Notes indicate the patient has been suffering from hallucinations during the day today. This afternoon upon visiting with the patient, he seems to have a hard time grasping the risks of a TEE and why this is needed. We will make him NPO at midnight and revisit with him in the morning to reassess if he is an adequate candidate for this procedure at this time.

## 2019-04-05 NOTE — Progress Notes (Signed)
  Fever and increasing WBC Lethargic resp distress  Patient Vitals for the past 24 hrs:  BP Temp Temp src Pulse Resp SpO2  04/05/19 1620 - 99.1 F (37.3 C) Oral 96 19 92 %  04/05/19 1231 - - - - (!) 24 -  04/05/19 1200 (!) 126/96 100.1 F (37.8 C) Oral (!) 103 (!) 28 95 %  04/05/19 1028 - 99.7 F (37.6 C) Axillary - - -  04/05/19 0945 (!) 149/93 (!) 101.1 F (38.4 C) Axillary (!) 111 (!) 24 93 %  04/05/19 0613 (!) 152/95 - - (!) 106 - -  04/05/19 0445 (!) 166/101 98.5 F (36.9 C) Oral (!) 109 20 96 %  04/05/19 0029 (!) 161/108 99.8 F (37.7 C) Oral (!) 113 - -  04/04/19 2225 (!) 143/96 99.2 F (37.3 C) Oral (!) 116 20 (!) 85 %  04/04/19 2010 - - - - - 95 %  04/04/19 1954 - 99.5 F (37.5 C) Oral - - -  04/04/19 1822 - - - (!) 109 - 95 %  04/04/19 1816 - 100.3 F (37.9 C) Oral (!) 107 - 91 %   CBC Latest Ref Rng & Units 04/05/2019 04/04/2019 04/03/2019  WBC 4.0 - 10.5 K/uL 16.5(H) 11.5(H) 9.5  Hemoglobin 13.0 - 17.0 g/dL 8.4(L) 7.9(L) 8.0(L)  Hematocrit 39.0 - 52.0 % 29.9(L) 27.9(L) 29.4(L)  Platelets 150 - 400 K/uL 206 152 148(L)    CMP Latest Ref Rng & Units 04/05/2019 04/04/2019 04/04/2019  Glucose 70 - 99 mg/dL 110(H) - 124(H)  BUN 8 - 23 mg/dL <5(L) - 7(L)  Creatinine 0.61 - 1.24 mg/dL 0.39(L) - 0.47(L)  Sodium 135 - 145 mmol/L 137 - 132(L)  Potassium 3.5 - 5.1 mmol/L 3.7 3.4(L) 2.9(L)  Chloride 98 - 111 mmol/L 104 - 99  CO2 22 - 32 mmol/L 24 - 27  Calcium 8.9 - 10.3 mg/dL 7.9(L) - 8.2(L)  Total Protein 6.5 - 8.1 g/dL - - 5.1(L)  Total Bilirubin 0.3 - 1.2 mg/dL - - 4.1(H)  Alkaline Phos 38 - 126 U/L - - 201(H)  AST 15 - 41 U/L - - 201(H)  ALT 0 - 44 U/L - - 125(H)      Impression 63 y.o.malewith a history ofalcohol abuse, hypertension, hypercholesterolemia, 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 ?  Rt sided pneumonia- ongoing aspiration- on unasyn   Encephalopathysecondary to alcohol abuse and withdrawal syndrome-   Staph epidermidis bacteremia- 3/4 bottle so a true pathogen- likely source could be the skin abrasion on both knees. He has B/l TKA - hence watch closely for infection On vancomycin- 2 d echo done- ideally will need TEE but not sure whetehr he is a candidate because of resp status  Acute GI bleed- has a h/o erosive esophagitis- and gastric ulcer- on PPI  Anemia-due to above- received blood transfusion  Fall secondary to all of the above- rhabdomyolysis  ?AKI, lactic acidosis, hypokalemia and hyponatremia have resolved Had hypercalcemia on presentation which has normalized  Abnormal LFTS- AST>ALT- could  have cirrhosis VS acute alcoholic hepatitis Discussed with his nurse

## 2019-04-05 NOTE — Progress Notes (Signed)
PT Cancellation Note  Patient Details Name: George Wade MRN: [id number] DOB: 1955/10/31   Cancelled Treatment:    Reason Eval/Treat Not Completed: Patient not medically ready. Per nursing, pt not appropriate today. Pt with fever and having hallucinations.  Will re-attempt later when time allows.  Roxanne Gates, PT, DPT  Roxanne Gates 04/05/2019, 3:00 PM

## 2019-04-05 NOTE — Consult Note (Addendum)
PHARMACY CONSULT NOTE - FOLLOW UP  Pharmacy Consult for Electrolyte Monitoring and Replacement   Recent Labs: Potassium (mmol/L)  Date Value  04/05/2019 3.7   Magnesium (mg/dL)  Date Value  04/05/2019 1.9   Calcium (mg/dL)  Date Value  04/05/2019 7.9 (L)   Albumin (g/dL)  Date Value  04/04/2019 2.3 (L)   Phosphorus (mg/dL)  Date Value  04/05/2019 1.3 (L)   Sodium (mmol/L)  Date Value  04/05/2019 137   Corrected Calcium 9.26  Assessment: RQ is 62 YOM admitted for pneumonia and GI bleed on 03/31/19. Past medical history for alcohol abuse and patient is currently under CIWA protocol.  -Hypokalemic  - K 3.5>3.9>3.2>2.9> 3.4>3.7  -Mg 2.2 > 1.9  -Phos 1.3  Goal of Therapy:  Potassium 4 Magnesium 2 Phosphorus 2.5  Plan: Will give 30 mmol Potassium Phosphate.  Will recheck electrolytes with AM labs.   Pharmacy will continue to monitor and adjust accordingly.  Lu Duffel, PharmD, BCPS Clinical Pharmacist 04/05/2019 7:33 AM

## 2019-04-06 ENCOUNTER — Encounter
Admission: EM | Disposition: A | Discharge status: Home / Self Care | Payer: Self-pay | Source: Home / Self Care | Attending: Internal Medicine

## 2019-04-06 DIAGNOSIS — Z8509 Personal history of malignant neoplasm of other digestive organs: Secondary | ICD-10-CM

## 2019-04-06 LAB — BASIC METABOLIC PANEL
Anion gap: 10 (ref 5–15)
BUN: 7 mg/dL — ABNORMAL LOW (ref 8–23)
CO2: 24 mmol/L (ref 22–32)
Calcium: 7.5 mg/dL — ABNORMAL LOW (ref 8.9–10.3)
Chloride: 105 mmol/L (ref 98–111)
Creatinine, Ser: 0.42 mg/dL — ABNORMAL LOW (ref 0.61–1.24)
GFR calc Af Amer: >60 mL/min (ref >60)
GFR calc non Af Amer: >60 mL/min (ref >60)
Glucose, Bld: 103 mg/dL — ABNORMAL HIGH (ref 70–99)
Potassium: 3.4 mmol/L — ABNORMAL LOW (ref 3.5–5.1)
Sodium: 139 mmol/L (ref 135–145)

## 2019-04-06 LAB — PHOSPHORUS: Phosphorus: 1.5 mg/dL — ABNORMAL LOW (ref 2.5–4.6)

## 2019-04-06 LAB — AMMONIA: Ammonia: 18 umol/L (ref 9–35)

## 2019-04-06 SURGERY — ECHOCARDIOGRAM, TRANSESOPHAGEAL
Anesthesia: Moderate Sedation

## 2019-04-06 MED ORDER — LORAZEPAM 2 MG/ML IJ SOLN
0.5000 mg | Freq: Four times a day (QID) | INTRAMUSCULAR | Status: DC
  Administered 2019-04-06 – 2019-04-07 (×3): 0.5 mg via INTRAVENOUS
  Filled 2019-04-06 (×3): qty 1

## 2019-04-06 MED ORDER — SODIUM CHLORIDE 0.9 % IV SOLN
2.0000 g | Freq: Two times a day (BID) | INTRAVENOUS | Status: DC
  Administered 2019-04-06 (×2): 2 g via INTRAVENOUS
  Filled 2019-04-06 (×4): qty 2

## 2019-04-06 MED ORDER — POTASSIUM CHLORIDE 10 MEQ/100ML IV SOLN
10.0000 meq | INTRAVENOUS | Status: AC
  Administered 2019-04-06 (×2): 10 meq via INTRAVENOUS
  Filled 2019-04-06 (×2): qty 100

## 2019-04-06 MED ORDER — POTASSIUM PHOSPHATES 15 MMOLE/5ML IV SOLN
30.0000 mmol | Freq: Once | INTRAVENOUS | Status: AC
  Administered 2019-04-06: 11:00:00 30 mmol via INTRAVENOUS
  Filled 2019-04-06: qty 10

## 2019-04-06 MED ORDER — FOLIC ACID 5 MG/ML IJ SOLN
1.0000 mg | Freq: Every day | INTRAMUSCULAR | Status: DC
  Administered 2019-04-06 – 2019-04-11 (×6): 1 mg via INTRAVENOUS
  Filled 2019-04-06 (×6): qty 0.2

## 2019-04-06 MED ORDER — METRONIDAZOLE IN NACL 5-0.79 MG/ML-% IV SOLN
500.0000 mg | Freq: Three times a day (TID) | INTRAVENOUS | Status: DC
  Filled 2019-04-06 (×2): qty 100

## 2019-04-06 MED ORDER — POTASSIUM CHLORIDE CRYS ER 20 MEQ PO TBCR: 40.0000 meq | EXTENDED_RELEASE_TABLET | Freq: Once | ORAL | Status: DC

## 2019-04-06 MED ORDER — VITAMIN C 500 MG PO TABS
500.0000 mg | ORAL_TABLET | Freq: Two times a day (BID) | ORAL | Status: DC
  Administered 2019-04-08 – 2019-04-28 (×41): 500 mg via ORAL
  Filled 2019-04-06 (×41): qty 1

## 2019-04-06 MED ORDER — PANTOPRAZOLE SODIUM 40 MG IV SOLR
40.0000 mg | Freq: Two times a day (BID) | INTRAVENOUS | Status: DC
  Administered 2019-04-06 – 2019-04-15 (×19): 40 mg via INTRAVENOUS
  Filled 2019-04-06 (×19): qty 40

## 2019-04-06 MED ORDER — METRONIDAZOLE IN NACL 5-0.79 MG/ML-% IV SOLN
500.0000 mg | Freq: Three times a day (TID) | INTRAVENOUS | Status: DC
  Administered 2019-04-06 – 2019-04-07 (×2): 500 mg via INTRAVENOUS
  Filled 2019-04-06 (×5): qty 100

## 2019-04-06 MED ORDER — SODIUM CHLORIDE 0.9% FLUSH
10.0000 mL | INTRAVENOUS | Status: DC | PRN
  Administered 2019-04-19: 21:00:00 10 mL
  Filled 2019-04-06: qty 40

## 2019-04-06 NOTE — Progress Notes (Signed)
Pharmacy Antibiotic Note  George Wade is a 63 y.o. male admitted on 03/31/2019 with aspiration pneumonia and now with CoNS bacteremia.  Pharmacy has been consulted for Unasyn and vancomycin dosing. Since admission his Scr is back to baseline and leukocytosis has resolved but he remains encephalopathic with 3/4 positive blood cultures. - likely source could be the skin abrasion on both knees since he has B/L TKA. ID will evaluate later to see whether he needs TEE - Cards deemed unsafe/  Plan: 1) Continue Unasyn 3g IV q8h  2) Continue Vancomycin 1250 mg IV Q 12 h   Goal AUC 400-550.  Expected AUC: 530  SCr used: 0.80 mg/dL (rounded up)  Current plan is to continue patient on vancomycin - will obtain vancomycin levels with 5th Dose   Height: 5\' 9"  (175.3 cm) Weight: 172 lb (78 kg) IBW/kg (Calculated) : 70.7  Temp (24hrs), Avg:99.5 F (37.5 C), Min:98.6 F (37 C), Max:101.2 F (38.4 C)  Recent Labs  Lab 03/31/19 2043 04/01/19 0302 04/02/19 0556 04/03/19 0422 04/03/19 1321 04/04/19 0307 04/04/19 1528 04/05/19 0424 04/06/19 0742  WBC  --  15.4* 10.3 9.5  --  11.5*  --  16.5*  --   CREATININE  --  1.02 0.84 0.58*  --  0.47*  --  0.39* 0.42*  LATICACIDVEN >11.0* 5.2* 1.2  --   --   --   --   --   --   VANCOTROUGH  --   --   --   --  33*  --   --   --   --   VANCOPEAK  --   --   --   --   --   --  25* 14*  --     Estimated Creatinine Clearance: 95.7 mL/min (A) (by C-G formula based on SCr of 0.42 mg/dL (L)).    Microbiology 6/20 BCx 3/4 S epidermidis 6/21 MRSA PCR (-) 6/20 UCx recollect 6/20 SARS CoV-2 (-)  Antibiotics this Admission Unasyn 6/21 >> Vancomycin 6/22 >>  Thank you for allowing pharmacy to be a part of this patient's care.  Lu Duffel, PharmD, BCPS Clinical Pharmacist 04/06/2019 12:02 PM

## 2019-04-06 NOTE — Progress Notes (Signed)
Asked Network engineer, Leda Gauze, to call and order a Telesitter - none available in our supply on the unit.

## 2019-04-06 NOTE — TOC Initial Note (Signed)
Transition of Care [facility]) - Initial/Assessment Note    Patient Details  Name: George Wade MRN: [id number] Date of Birth: Nov 27, 1955  [facility] CM/SW Contact:    Beverly Sessions, RN Phone Number: 04/06/2019, 4:25 PM  Clinical Narrative:                 Patient admitted with Staph epidermidis bacteremia  Patient completely disoriented at this time and bedside RN placing mitts on patient  RNCM spoke with friend Minda Ditto who is listed at contact  Per [name] patient has lived in an [place] that belongs to [place] on his property for the last 2 years  [name] states that 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  Patient roughly receives $1800 a month in social security. Over the course of the past 2 years steve states that he has invested 20,000-$30,000 in the patient trying to help him get back on his feet, however it just has not worked, and the patient drinks all the time.  [name] states that after the patient was taken to the hospital he went out to RV and found it completely trashed.  [name] states "it's not that Im not interested in his care, but he can not come back and stay here when he is discharged  [name] states states the patient's relatives are as follows - 4 children who are estranged - ex wife - was finalized 5 months ago - a brother in [place] who is estranged - and a sister Chong Sicilian who also does not have anything to do with the patient.  [name] was able to provide me with her phone number [phone])  PT has seen patient and recommended SNF.  It is also noted that patient will require 2-4 weeks of IV antibiotics. Patient does not have health insurance. RNCM discussed case with [facility] leadership Nathaniel Man.  Per [name] patient will likely have to stay inpatient for duration of antibiotics due to the above information  The Following steps will need to be completed below - Monday follow up with financial counselors to determine if patient has been  assessed for Medicaid - If patient mental status does not improve reach out to sisters listed above - If mental status does not improve, and sister is not willing to make decision reach back out to leadership to discuss guardianship   Expected Discharge Plan: [place] Barriers to Discharge: Continued Medical Work up   Patient Goals and CMS Choice Patient states their goals for this hospitalization and ongoing recovery are:: To feel better      Expected Discharge Plan and Services Expected Discharge Plan: [place] In-house Referral: Clinical Social Work Discharge Planning Services: CM Consult   Living arrangements for the past 2 months: [place]                                      Prior Living Arrangements/Services Living arrangements for the past 2 months: Single Family Home Lives with:: Self Patient language and need for interpreter reviewed:: No Do you feel safe going back to the place where you live?: Yes      Need for Family Participation in Patient Care: Yes (Comment) Care giver support system in place?: No (comment) Current home services: DME(Patient has a cane at home.) Criminal Activity/Legal Involvement Pertinent to Current Situation/Hospitalization: No - Comment as needed  Activities of Daily Living Home Assistive Devices/Equipment: None ADL Screening (condition at time of admission) Patient's cognitive ability adequate to safely complete daily activities?: No Is the patient deaf or have difficulty hearing?: No Does the patient have difficulty seeing, even when wearing glasses/contacts?: No Does the patient have difficulty concentrating, remembering, or making decisions?: Yes Patient able to express need for assistance with ADLs?: Yes Does the patient have difficulty dressing or bathing?: Yes Independently performs ADLs?: No Communication: Needs assistance Is this a change from baseline?: Pre-admission  baseline Dressing (OT): Needs assistance Is this a change from baseline?: Change from baseline, expected to last <3days Grooming: Needs assistance Is this a change from baseline?: Change from baseline, expected to last <3 days Feeding: Needs assistance Is this a change from baseline?: Change from baseline, expected to last <3 days Bathing: Needs assistance Is this a change from baseline?: Change from baseline, expected to last <3 days Toileting: Needs assistance Is this a change from baseline?: Change from baseline, expected to last <3 days In/Out Bed: Needs assistance Is this a change from baseline?: Change from baseline, expected to last <3 days Walks in Home: Needs assistance Is this a change from baseline?: Change from baseline, expected to last <3 days Does the patient have difficulty walking or climbing stairs?: Yes Weakness of Legs: None Weakness of Arms/Hands: None  Permission Sought/Granted Permission sought to share information with : Case Manager Permission granted to share information with : Yes, Verbal Permission Granted              Emotional Assessment Appearance:: Appears older than stated age   Affect (typically observed): Guarded, Calm Orientation: : Oriented to Self, Oriented to Place, Oriented to  Time, Oriented to Situation Alcohol / Substance Use: Alcohol Use Psych Involvement: No (comment)  Admission diagnosis:  Dehydration [E86.0] Dyspnea [R06.00] Fall [W19.XXXA] Alcohol withdrawal syndrome with complication ([place]) [L54.492] Traumatic rhabdomyolysis, initial encounter ([place]) [T79.6XXA] Gastrointestinal hemorrhage, unspecified gastrointestinal hemorrhage type [K92.2] Patient Active Problem List   Diagnosis Date Noted  . GI bleed 03/31/2019  . Alcohol withdrawal ([place]) 12/09/2017   PCP:  Patient, [name] Per Pharmacy:   [facility] [address], [place] [address] Phone: [phone] Fax:  [phone]     Social Determinants of Health (SDOH) Interventions    Readmission Risk Interventions No flowsheet data found.

## 2019-04-06 NOTE — Progress Notes (Signed)
Progress Note  Patient Name: George Wade Date of Encounter: 04/06/2019  Primary Cardiologist: new to [facility]  Subjective   More lucid this morning. No chest pain or SOB. Afebrile.   Inpatient Medications    Scheduled Meds: . amLODipine  5 mg Oral Daily  . folic acid  1 mg Oral Daily  . ipratropium-albuterol  3 mL Nebulization Q6H  . mouth rinse  15 mL Mouth Rinse BID  . multivitamin with minerals  1 tablet Oral Daily  . pantoprazole  40 mg Oral BID AC  . sodium chloride flush  3 mL Intravenous Q12H  . thiamine  100 mg Oral Daily   Or  . thiamine  100 mg Intravenous Daily   Continuous Infusions: . sodium chloride Stopped (04/06/19 0201)  . ampicillin-sulbactam (UNASYN) IV Stopped (04/06/19 0328)  . vancomycin 1,250 mg (04/06/19 0028)   PRN Meds: sodium chloride, acetaminophen, calcium carbonate, LORazepam **OR** LORazepam, [DISCONTINUED] ondansetron **OR** ondansetron (ZOFRAN) IV, sodium chloride flush   Vital Signs    Vitals:   04/06/19 0110 04/06/19 0219 04/06/19 0507 04/06/19 0600  BP:  (!) 144/94 (!) 146/86   Pulse:  (!) 109 (!) 105   Resp:   20   Temp:  98.7 F (37.1 C) (!) 101.2 F (38.4 C) 98.6 F (37 C)  TempSrc:  Oral Oral Oral  SpO2: 92% 96% 96%   Weight:      Height:        Intake/Output Summary (Last 24 hours) at 04/06/2019 0734 Last data filed at 04/06/2019 0400 Gross per 24 hour  Intake 1444.02 ml  Output 1125 ml  Net 319.02 ml   Filed Weights   03/31/19 1912  Weight: 78 kg    Telemetry    SR - Personally Reviewed  ECG    n/a - Personally Reviewed  Physical Exam   GEN: No acute distress.   Neck: No JVD. Cardiac: RRR, no murmurs, rubs, or gallops.  Respiratory: Clear to auscultation bilaterally.  GI: Soft, nontender, non-distended.   MS: No edema; No deformity. Neuro:  Alert and oriented x 3; Nonfocal.  Psych: Normal affect.  Labs    Chemistry Recent Labs  Lab 03/31/19 1950  04/02/19 0556 04/03/19 0422 04/04/19  0307 04/04/19 1754 04/05/19 0424  NA 128*   < > 140 143 132*  --  137  K 3.2*   < > 3.9 3.2* 2.9* 3.4* 3.7  CL 78*   < > 105 105 99  --  104  CO2 12*   < > 25 29 27   --  24  GLUCOSE 82   < > 121* 113* 124*  --  110*  BUN 17   < > 22 18 7*  --  <5*  CREATININE 1.54*   < > 0.84 0.58* 0.47*  --  0.39*  CALCIUM 13.4*   < > 9.5 9.1 8.2*  --  7.9*  PROT 6.3*  --  5.0*  --  5.1*  --   --   ALBUMIN 2.9*  --  2.3*  --  2.3*  --   --   AST 213*  --  207*  --  201*  --   --   ALT 98*  --  98*  --  125*  --   --   ALKPHOS 280*  --  166*  --  201*  --   --   BILITOT 1.9*  --  2.2*  --  4.1*  --   --  GFRNONAA 48*   < > >60 >60 >60  --  >60  GFRAA 55*   < > >60 >60 >60  --  >60  ANIONGAP 38*   < > 10 9 6   --  9   < > = values in this interval not displayed.     Hematology Recent Labs  Lab 04/03/19 0422 04/04/19 0307 04/05/19 0424  WBC 9.5 11.5* 16.5*  RBC 3.53* 3.53* 3.70*  HGB 8.0* 7.9* 8.4*  HCT 29.4* 27.9* 29.9*  MCV 83.3 79.0* 80.8  MCH 22.7* 22.4* 22.7*  MCHC 27.2* 28.3* 28.1*  RDW 26.0* 27.5* 27.9*  PLT 148* 152 206    Cardiac Enzymes Recent Labs  Lab 03/31/19 1950  TROPONINI 0.04*   No results for input(s): TROPIPOC in the last 168 hours.   BNPNo results for input(s): BNP, PROBNP in the last 168 hours.   DDimer No results for input(s): DDIMER in the last 168 hours.   Radiology    Dg Chest 1 View  Result Date: 04/04/2019 IMPRESSION: Persistent bibasilar infiltrates suspicious for pneumonia, possibly on the basis of aspiration. Overall right basilar aeration has mildly improved. Electronically Signed   By: Richardean Sale M.D.   On: 04/04/2019 15:56   Dg Chest Port 1 View  Result Date: 04/05/2019 IMPRESSION: 1. Bilateral infiltrates, worsened in the right upper lobe. Electronically Signed   By: Titus Dubin M.D.   On: 04/05/2019 15:28    Cardiac Studies   2D Echo 04/05/2019: 1. The left ventricle has hyperdynamic systolic function, with an ejection fraction  of >65%. The cavity size was normal. There is mild concentric left ventricular hypertrophy. Left ventricular diastolic function could not be evaluated due to nondiagnostic images. No evidence of left ventricular regional wall motion abnormalities.  2. Left atrial size was not assessed.  3. The mitral valve is grossly normal. No evidence of mitral valve stenosis.  4. The tricuspid valve is not well visualized. Tricuspid valve regurgitation was not assessed by color flow Doppler.  5. The aortic valve is abnormal. Moderate thickening of the aortic valve. Moderate calcification of the aortic valve. No stenosis of the aortic valve.  6. The interatrial septum was not assessed.  Patient Profile     63 y.o. male with history of alcoholism, GI bleed, tonsillar cancer HTN and HLD admitted with GI bleed and bacteremia   Assessment & Plan    1. Staph epidermidis bacteremia: -Consulted for TEE -Upon reviewing the patient's chart, he is not currently a candidate for TEE given his history of severe erosive esophagitis with melena and coffee ground emesis, tonsillar cancer and with an admission HGB of 6.3 -There is also some concern regarding his recent hallucinations and combative nature during surface echo -Please call if his clinical scenario improves    For questions or updates, please contact [place] Please consult [email or web address] for contact info under Cardiology/STEMI.    Signed, Christell Faith, PA-C [place] Pager: [phone] 04/06/2019, 7:34 AM

## 2019-04-06 NOTE — Progress Notes (Signed)
Date of Admission:  03/31/2019     Subjective: More Awake and alert Says he is feeling better Has cough while eating  Medications:  . amLODipine  5 mg Oral Daily  . folic acid  1 mg Intravenous Daily  . ipratropium-albuterol  3 mL Nebulization Q6H  . mouth rinse  15 mL Mouth Rinse BID  . multivitamin with minerals  1 tablet Oral Daily  . pantoprazole (PROTONIX) IV  40 mg Intravenous Q12H  . sodium chloride flush  3 mL Intravenous Q12H  . thiamine  100 mg Oral Daily   Or  . thiamine  100 mg Intravenous Daily    Objective: Vital signs in last 24 hours: Temp:  [98.6 F (37 C)-101.2 F (38.4 C)] 98.6 F (37 C) (06/26 0600) Pulse Rate:  [96-109] 105 (06/26 0507) Resp:  [19-28] 20 (06/26 0507) BP: (126-146)/(79-96) 146/86 (06/26 0507) SpO2:  [92 %-98 %] 96 % (06/26 0507)  PHYSICAL EXAM:  General: Alert, cooperative, some resp distress, pale.  Lungs: crepts rt side- rhonchi a few both sides Heart: tachycardia Abdomen: Soft,  Extremities: atraumatic, no cyanosis. No edema. No clubbing Neurologic: Grossly non-focal  Lab Results Recent Labs    04/04/19 0307  04/05/19 0424 04/06/19 0742  WBC 11.5*  --  16.5*  --   HGB 7.9*  --  8.4*  --   HCT 27.9*  --  29.9*  --   NA 132*  --  137 139  K 2.9*   < > 3.7 3.4*  CL 99  --  104 105  CO2 27  --  24 24  BUN 7*  --  <5* 7*  CREATININE 0.47*  --  0.39* 0.42*   < > = values in this interval not displayed.   Liver Panel Recent Labs    04/04/19 0307  PROT 5.1*  ALBUMIN 2.3*  AST 201*  ALT 125*  ALKPHOS 201*  BILITOT 4.1*   Sedimentation Rate No results for input(s): ESRSEDRATE in the last 72 hours. C-Reactive Protein No results for input(s): CRP in the last 72 hours.  Microbiology:  Studies/Results: Dg Chest 1 View  Result Date: 04/04/2019 CLINICAL DATA:  Fever. Admitted for gastrointestinal bleeding. History of lung cancer and hypertension. EXAM: CHEST  1 VIEW COMPARISON:  Radiographs 04/02/2019, 04/01/2019  and 03/31/2019. FINDINGS: 1358 hours. The enteric tube has been removed. The heart size and mediastinal contours are stable. There are persistent low lung volumes with bibasilar infiltrates. Overall aeration of the right lung base has mildly improved. There is no pneumothorax or significant pleural effusion. The bones appear unchanged. IMPRESSION: Persistent bibasilar infiltrates suspicious for pneumonia, possibly on the basis of aspiration. Overall right basilar aeration has mildly improved. Electronically Signed   By: Richardean Sale M.D.   On: 04/04/2019 15:56   Dg Chest Port 1 View  Result Date: 04/05/2019 CLINICAL DATA:  Fever.  History of lung cancer. EXAM: PORTABLE CHEST 1 VIEW COMPARISON:  Chest x-ray from yesterday. FINDINGS: Stable cardiomediastinal silhouette. Persistent low lung volumes with bilateral infiltrates, increasing in the right upper lobe. No pleural effusion or pneumothorax. No acute osseous abnormality. IMPRESSION: 1. Bilateral infiltrates, worsened in the right upper lobe. Electronically Signed   By: Titus Dubin M.D.   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 ?  Rt sided pneumonia- ongoing aspiration- on unasyn- changed to cefepime and flagyl today because of worsening leucoytosis fever and pneumonia   Encephalopathysecondary to alcohol abuse and withdrawal syndrome-fluctuating   Staph epidermidis bacteremia- 3/4 bottle so a true pathogen- likely source could be the skin abrasion on both knees. He has B/l TKA - hence watch closely for infection On vancomycin- 2 d echo done- ideally will need TEE but not a  candidate because of resp status- will treat for 4 weeks  Acute GI bleed- has a h/o erosive esophagitis- and gastric ulcer- on PPI  Anemia-due to above- received blood transfusion  Fall secondary to all of the above- rhabdomyolysis  ?AKI, lactic acidosis, hypokalemia and hyponatremia have resolved Had hypercalcemia on presentation which has normalized  Abnormal LFTS- AST>ALT- could have cirrhosis VS acute alcoholic hepatitis Discussed with patient and Dr.weiting ID will follow him remotely this weekend-call if needed

## 2019-04-06 NOTE — Progress Notes (Signed)
Physical Therapy Treatment Patient Details Name: George Wade MRN: [id number] DOB: 04-14-1956 Today's Date: 04/06/2019    History of Present Illness presented to ER secondary to progressive weakness, black/tarry stools and fall/unable to recover (outside in rain x4-5 hours); admitted for management of GIB, aspiration pna, encephalopathy and staph bacteremia.    PT Comments     Pt. Was pleasant and willing to participate with PT, followed cues well and demonstrated increased participation with Supine to sit but O2 sats dropped to a low of 87% with 5L O2 via [place] with effort of transfer at EOB, returned to 94% when returned to reclined position and cued for breathing technique. Pt. continues to perform STS with mod. Assist but was unable to demonstrate AMB today due to fatigue and generalized weakness. Continues to be mildly confused, describing items he has at home as if present but recognizes that he is in the hospital. He described back pain during transfer which he states is due to hitting his spine on a hard edge in his home the night before his admission to the hospital, no back injury otherwise noted in record. Continues to be appropriate for skilled PT to work toward achieving mobility goals.    Follow Up Recommendations  SNF     Equipment Recommendations       Recommendations for Other Services       Precautions / Restrictions Precautions Precautions: Fall Restrictions Weight Bearing Restrictions: No    Mobility  Bed Mobility Overal bed mobility: Needs Assistance Bed Mobility: Supine to Sit     Supine to sit: Min assist     General bed mobility comments: Pt. able to actively bend knees and place feet on bed as well as perform 4 small bridges to scoot hips to L with verbal cues, grab handrail with RUE and roll to the L, and push through R hand to perform supine to sit with min. assist.  Transfers Overall transfer level: Needs assistance Equipment used: Rolling walker  (2 wheeled) Transfers: Sit to/from Stand Sit to Stand: Mod assist         General transfer comment: cuing for hand placement, assit for lift off and standing balance; Pt. endurance low, unable to attain fully upright posture  Ambulation/Gait             General Gait Details: Pt. lacked adequate strength and endurance to ambulate during this session   Stairs             Wheelchair Mobility    Modified Rankin (Stroke Patients Only)       Balance Overall balance assessment: Needs assistance Sitting-balance support: Feet supported;Bilateral upper extremity supported Sitting balance-Leahy Scale: Good Sitting balance - Comments: Pt. able to maintain upright with BUE support for ~3 min. with CGA, responds to verbal cue to correct lean Postural control: Posterior lean Standing balance support: Bilateral upper extremity supported Standing balance-Leahy Scale: Poor Standing balance comment: Pt. stood for ~ 5 sec. before choosing to sit from fatigue                            Cognition Arousal/Alertness: Awake/alert Behavior During Therapy: WFL for tasks assessed/performed Overall Cognitive Status: No family/caregiver present to determine baseline cognitive functioning                                 General Comments: Pt. was eager to participate  in PT. Upon questioning he began by stating that he was in a hospital and that it was July 27th, 2010 and the president was Rowe Clack but later was able to recognize that this is 202 and that Trump is president. He spoke about "solid steel boots" that he believed were nearby but which were not in the patient's belongings.      Exercises      General Comments        Pertinent Vitals/Pain Pain Assessment: Faces(Pt. described hitting "right on his spine" the night before his incident that ended in hospitalization) Faces Pain Scale: Hurts a little bit Pain Location: Back Pain Descriptors /  Indicators: Aching Pain Intervention(s): Repositioned;Limited activity within patient's tolerance;Monitored during session    Home Living                      Prior Function            PT Goals (current goals can now be found in the care plan section) Acute Rehab PT Goals Patient Stated Goal: agreeable to session, eager for OOB to chair PT Goal Formulation: With patient Time For Goal Achievement: 04/17/19 Potential to Achieve Goals: Fair Progress towards PT goals: Progressing toward goals(Progressing toward bed mobility goal only, unable to AMB today)    Frequency    Min 2X/week      PT Plan Current plan remains appropriate    Co-evaluation              AM-PAC PT "6 Clicks" Mobility   Outcome Measure  Help needed turning from your back to your side while in a flat bed without using bedrails?: A Little Help needed moving from lying on your back to sitting on the side of a flat bed without using bedrails?: A Lot Help needed moving to and from a bed to a chair (including a wheelchair)?: A Lot Help needed standing up from a chair using your arms (e.g., wheelchair or bedside chair)?: A Lot Help needed to walk in hospital room?: A Lot Help needed climbing 3-5 steps with a railing? : Total 6 Click Score: 12    End of Session Equipment Utilized During Treatment: Gait belt Activity Tolerance: Patient tolerated treatment well Patient left: with call bell/phone within reach;in bed(nursing aware of position) Nurse Communication: Other (comment)(Pt. requesting liquids) PT Visit Diagnosis: Muscle weakness (generalized) (M62.81);Difficulty in walking, not elsewhere classified (R26.2);History of falling (Z91.81);Unsteadiness on feet (R26.81)     Time: 9628-3662 PT Time Calculation (min) (ACUTE ONLY): 38 min  Charges:  $Therapeutic Activity: 38-52 mins                     Willa Rough DPT, ATC Willa Rough 04/06/2019, 12:16 PM

## 2019-04-06 NOTE — Progress Notes (Signed)
Initial Nutrition Assessment  DOCUMENTATION CODES:   Not applicable  INTERVENTION:   RD will add supplements once diet advanced  MVI, thiamine and folic acid in the setting of etoh abuse- wound consider high dose IV thiamine as pt at high risk for wernickes  Vitamin C 250mg  po BID  Pt at high refeed risk; recommend monitor K, Mg and P labs daily   NUTRITION DIAGNOSIS:   Inadequate oral intake related to acute illness(GIB) as evidenced by NPO status.  GOAL:   Patient will meet greater than or equal to 90% of their needs  MONITOR:   PO intake, Supplement acceptance, Labs, Weight trends, Skin, I & O's  REASON FOR ASSESSMENT:   Malnutrition Screening Tool    ASSESSMENT:   63 y.o. 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 Pt found to have aspiration PNA and GIB  RD working remotely.  Suspect pt with poor appetite and oral intake pta r/t etoh abuse. Pt remains NPO today; pt was seen by SLP but not able to advance diet r/t decline in mental status. RD will add supplements once diet advanced. Continue vitamins. Would consider high dose IV thiamine as pt at high risk for Wernickes. Pt is at high refeed risk; monitor electrolytes. Pt also noted to have diarrhea x 1 month. Per chart, pt appears fairly weight stable pta. GI consult pending.    Medications reviewed and include: folic acid, MVI, protonix, thiamine, cefepime, metronidazole, KPhos, vancomycin   Labs reviewed: K 3.4(L), BUN 7(L), creat 0.42(L), P 1.5(L) Wbc- 16.5(H), Hgb 8.4(L), Hct 29.9(L), MCH 22.7(L), MCHC 28.1(L)  Unable to complete Nutrition-Focused physical exam at this time.   Diet Order:   Diet Order    None     EDUCATION NEEDS:   No education needs have been identified at this time  Skin:  Skin Assessment: Reviewed RN Assessment(Ecchymosis)  Last BM:  6/24- TYPE 3  Height:   Ht Readings from Last 1 Encounters:  03/31/19 5\' 9"  (1.753 m)    Weight:   Wt Readings from Last 1 Encounters:  03/31/19 78 kg    Ideal Body Weight:  72.7 kg  BMI:  Body mass index is 25.4 kg/m.  Estimated Nutritional Needs:   Kcal:  1900-2200kcal/day  Protein:  95-110g/day  Fluid:  >2.1L/day  Koleen Distance MS, RD, LDN Pager #- [phone] Office#- [phone] After Hours Pager: [phone]

## 2019-04-06 NOTE — Progress Notes (Signed)
SLP Cancellation Note  Patient Details Name: Rolfe Hartsell MRN: [id number] DOB: March 20, 1956   Cancelled treatment:       Reason Eval/Treat Not Completed: Medical issues which prohibited therapy;Patient not medically ready. Consulted NSG re: pt's status at this time. NSG reported he was verbal but continues to exhibit a decline in mental status, behavior. She is having to given Ativan currently.  Recommend continue NPO status d/t increased risk for aspiration/choking d/t declined mental status; frequent oral care for hygiene and stimulation of swallowing. ST services will f/u tomorrow for ongoing assessment of swallowing. NSG agreed.      Orinda Kenner, [place], CCC-SLP Dosha Broshears 04/06/2019, 12:22 PM

## 2019-04-06 NOTE — Progress Notes (Signed)
[name], male   DOB: May 25, 1956, 63 y.o.   MRN: [id number]  [facility] PROGRESS NOTE  George Wade ZRA:[id number] DOB: October 10, 1956 DOA: 03/31/2019 PCP: Patient, [name] Per  HPI/Subjective: Patient more alert today.  Answered a few more questions.  States he was not feeling good.  Able to communicate a little bit better today.  Objective: Vitals:   04/06/19 1253 04/06/19 1416  BP:  (!) 130/93  Pulse:  (!) 107  Resp:  18  Temp:    SpO2: 94% 92%    Filed Weights   03/31/19 1912  Weight: 78 kg    ROS: Review of Systems  Unable to perform ROS: Acuity of condition  Respiratory: Positive for cough and shortness of breath.   Cardiovascular: Negative for chest pain.  Gastrointestinal: Negative for abdominal pain.   Exam: Physical Exam  HENT:  Nose: No mucosal edema.  Mouth/Throat: No oropharyngeal exudate or posterior oropharyngeal edema.  Eyes: Pupils are equal, round, and reactive to light. Conjunctivae, EOM and lids are normal.  Neck: No JVD present. Carotid bruit is not present. No edema present. No thyroid mass and no thyromegaly present.  Cardiovascular: S1 normal and S2 normal. Exam reveals no gallop.  No murmur heard. Pulses:      Dorsalis pedis pulses are 2+ on the right side and 2+ on the left side.  Respiratory: No respiratory distress. He has decreased breath sounds in the right lower field and the left lower field. He has no wheezes. He has rhonchi in the right lower field and the left lower field. He has no rales.  GI: Soft. Bowel sounds are normal. There is no abdominal tenderness.  Musculoskeletal:     Right ankle: He exhibits swelling.     Left ankle: He exhibits swelling.  Lymphadenopathy:    He has no cervical adenopathy.  Neurological: He is alert.  Skin: Skin is warm. Nails show no clubbing.  Skin abrasions bilateral knees.  Psychiatric: His affect is blunt.      Data Reviewed: Basic Metabolic Panel: Recent Labs  Lab  03/31/19 1950  04/02/19 0556 04/03/19 0422 04/04/19 0307 04/04/19 1754 04/05/19 0424 04/06/19 0742  NA 128*   < > 140 143 132*  --  137 139  K 3.2*   < > 3.9 3.2* 2.9* 3.4* 3.7 3.4*  CL 78*   < > 105 105 99  --  104 105  CO2 12*   < > 25 29 27   --  24 24  GLUCOSE 82   < > 121* 113* 124*  --  110* 103*  BUN 17   < > 22 18 7*  --  <5* 7*  CREATININE 1.54*   < > 0.84 0.58* 0.47*  --  0.39* 0.42*  CALCIUM 13.4*   < > 9.5 9.1 8.2*  --  7.9* 7.5*  MG 2.2  --   --   --   --  1.9 1.9  --   PHOS  --   --   --   --   --   --  1.3* 1.5*   < > = values in this interval not displayed.   Liver Function Tests: Recent Labs  Lab 03/31/19 1950 04/02/19 0556 04/04/19 0307  AST 213* 207* 201*  ALT 98* 98* 125*  ALKPHOS 280* 166* 201*  BILITOT 1.9* 2.2* 4.1*  PROT 6.3* 5.0* 5.1*  ALBUMIN 2.9* 2.3* 2.3*   CBC: Recent Labs  Lab 03/31/19 1950 04/01/19 0302  04/01/19 1706 04/02/19 0556 04/03/19 0422 04/04/19 0307 04/05/19 0424  WBC 23.0* 15.4*  --   --  10.3 9.5 11.5* 16.5*  NEUTROABS 22.1*  --   --   --   --   --   --   --   HGB 6.3* 6.9*   < > 7.2* 8.0* 8.0* 7.9* 8.4*  HCT 25.7* 25.1*   < > 25.2* 28.9* 29.4* 27.9* 29.9*  MCV 80.6 78.0*  --   --  81.6 83.3 79.0* 80.8  PLT 386 206  --   --  187 148* 152 206   < > = values in this interval not displayed.   Cardiac Enzymes: Recent Labs  Lab 03/31/19 1950 04/01/19 0302 04/02/19 0556  CKTOTAL 1,674* 1,017* 347  TROPONINI 0.04*  --   --     CBG: Recent Labs  Lab 04/02/19 0155 04/02/19 0604 04/02/19 1139 04/03/19 0009 04/03/19 0603  GLUCAP 103* 121* 118* 107* 104*    Recent Results (from the past 240 hour(s))  SARS Coronavirus 2 (CEPHEID - Performed in [place] hospital lab), Hosp Order     Status: None   Collection Time: 03/31/19  8:43 PM   Specimen: Nasopharyngeal Swab  Result Value Ref Range Status   SARS Coronavirus 2 NEGATIVE NEGATIVE Final    Comment: (NOTE) If result is NEGATIVE SARS-CoV-2 target nucleic  acids are NOT DETECTED. The SARS-CoV-2 RNA is generally detectable in upper and lower  respiratory specimens during the acute phase of infection. The lowest  concentration of SARS-CoV-2 viral copies this assay can detect is 250  copies / mL. A negative result does not preclude SARS-CoV-2 infection  and should not be used as the sole basis for treatment or other  patient management decisions.  A negative result may occur with  improper specimen collection / handling, submission of specimen other  than nasopharyngeal swab, presence of viral mutation(s) within the  areas targeted by this assay, and inadequate number of viral copies  (<250 copies / mL). A negative result must be combined with clinical  observations, patient history, and epidemiological information. If result is POSITIVE SARS-CoV-2 target nucleic acids are DETECTED. The SARS-CoV-2 RNA is generally detectable in upper and lower  respiratory specimens dur ing the acute phase of infection.  Positive  results are indicative of active infection with SARS-CoV-2.  Clinical  correlation with patient history and other diagnostic information is  necessary to determine patient infection status.  Positive results do  not rule out bacterial infection or co-infection with other viruses. If result is PRESUMPTIVE POSTIVE SARS-CoV-2 nucleic acids MAY BE PRESENT.   A presumptive positive result was obtained on the submitted specimen  and confirmed on repeat testing.  While 2019 novel coronavirus  (SARS-CoV-2) nucleic acids may be present in the submitted sample  additional confirmatory testing may be necessary for epidemiological  and / or clinical management purposes  to differentiate between  SARS-CoV-2 and other Sarbecovirus currently known to infect humans.  If clinically indicated additional testing with an alternate test  methodology [id number]) is advised. The SARS-CoV-2 RNA is generally  detectable in upper and lower respiratory  sp ecimens during the acute  phase of infection. The expected result is Negative. Fact Sheet for Patients:  StrictlyIdeas.no Fact Sheet for Healthcare Providers: BankingDealers.co.za This test is not yet approved or cleared by the [place] FDA and has been authorized for detection and/or diagnosis of SARS-CoV-2 by FDA under an Emergency Use Authorization (EUA).  This EUA  will remain in effect (meaning this test can be used) for the duration of the COVID-19 declaration under Section 564(b)(1) of the Act, 21 U.S.C. section 360bbb-3(b)(1), unless the authorization is terminated or revoked sooner. Performed at [facility], [address] [place], [place] [address]   Culture, blood (routine x 2)     Status: Abnormal   Collection Time: 03/31/19  8:43 PM   Specimen: BLOOD  Result Value Ref Range Status   Specimen Description   Final    BLOOD LEFT ANTECUBITAL Performed at [facility], [address], [place], [place] [address]    Special Requests   Final    BOTTLES DRAWN AEROBIC AND ANAEROBIC Blood Culture adequate volume Performed at [facility], [address] [place], [place] [address]    Culture  Setup Time   Final    GRAM POSITIVE COCCI AEROBIC BOTTLE ONLY CRITICAL VALUE NOTED.  VALUE IS CONSISTENT WITH PREVIOUSLY REPORTED AND CALLED VALUE. [place] Performed at [facility], [place] [address], [place], [place] [address]    Culture STAPHYLOCOCCUS EPIDERMIDIS (A)  Final   Report Status 04/04/2019 FINAL  Final   Organism ID, Bacteria STAPHYLOCOCCUS EPIDERMIDIS  Final      Susceptibility   Staphylococcus epidermidis - MIC*    CIPROFLOXACIN <=0.5 SENSITIVE Sensitive     ERYTHROMYCIN >=8 RESISTANT Resistant     GENTAMICIN <=0.5 SENSITIVE Sensitive     OXACILLIN >=4 RESISTANT Resistant     TETRACYCLINE <=1 SENSITIVE Sensitive     VANCOMYCIN 2 SENSITIVE  Sensitive     TRIMETH/SULFA <=10 SENSITIVE Sensitive     CLINDAMYCIN <=0.25 SENSITIVE Sensitive     RIFAMPIN <=0.5 SENSITIVE Sensitive     Inducible Clindamycin NEGATIVE Sensitive     * STAPHYLOCOCCUS EPIDERMIDIS  Culture, blood (routine x 2)     Status: Abnormal   Collection Time: 03/31/19  8:43 PM   Specimen: BLOOD  Result Value Ref Range Status   Specimen Description   Final    BLOOD RIGHT ANTECUBITAL Performed at [facility], [address], [place], [place] [address]    Special Requests   Final    BOTTLES DRAWN AEROBIC AND ANAEROBIC Blood Culture adequate volume Performed at [facility], [address] [place], [place] [address]    Culture  Setup Time   Final    GRAM POSITIVE COCCI IN BOTH AEROBIC AND ANAEROBIC BOTTLES CRITICAL RESULT CALLED TO, READ BACK BY AND VERIFIED WITH: MYRA SLAUGHTER ON 04/01/2019 AT 1344 QSD GRAM STAIN REVIEWED-AGREE WITH RESULT T. TYSOR    Culture (A)  Final    STAPHYLOCOCCUS EPIDERMIDIS SUSCEPTIBILITIES PERFORMED ON PREVIOUS CULTURE WITHIN THE LAST 5 DAYS. Performed at [facility], [place] [address] [place], [place] [address]    Report Status 04/04/2019 FINAL  Final  Blood Culture ID Panel (Reflexed)     Status: Abnormal   Collection Time: 03/31/19  8:43 PM  Result Value Ref Range Status   Enterococcus species NOT DETECTED NOT DETECTED Final   Listeria monocytogenes NOT DETECTED NOT DETECTED Final   Staphylococcus species DETECTED (A) NOT DETECTED Final    Comment: Methicillin (oxacillin) resistant coagulase negative staphylococcus. Possible blood culture contaminant (unless isolated from more than one blood culture draw or clinical case suggests pathogenicity). No antibiotic treatment is indicated for blood  culture contaminants. CRITICAL RESULT CALLED TO, READ BACK BY AND VERIFIED WITH: MYRA SLAUGHTER ON 04/01/2019 AT 1344 QSD  Staphylococcus aureus (BCID) NOT DETECTED NOT DETECTED Final   Methicillin resistance  DETECTED (A) NOT DETECTED Final    Comment: CRITICAL RESULT CALLED TO, READ BACK BY AND VERIFIED WITH: MYRA SLAUGHTER ON 04/01/2019 AT 1344 QSD    Streptococcus species NOT DETECTED NOT DETECTED Final   Streptococcus agalactiae NOT DETECTED NOT DETECTED Final   Streptococcus pneumoniae NOT DETECTED NOT DETECTED Final   Streptococcus pyogenes NOT DETECTED NOT DETECTED Final   Acinetobacter baumannii NOT DETECTED NOT DETECTED Final   Enterobacteriaceae species NOT DETECTED NOT DETECTED Final   Enterobacter cloacae complex NOT DETECTED NOT DETECTED Final   Escherichia coli NOT DETECTED NOT DETECTED Final   Klebsiella oxytoca NOT DETECTED NOT DETECTED Final   Klebsiella pneumoniae NOT DETECTED NOT DETECTED Final   Proteus species NOT DETECTED NOT DETECTED Final   Serratia marcescens NOT DETECTED NOT DETECTED Final   Haemophilus influenzae NOT DETECTED NOT DETECTED Final   Neisseria meningitidis NOT DETECTED NOT DETECTED Final   Pseudomonas aeruginosa NOT DETECTED NOT DETECTED Final   Candida albicans NOT DETECTED NOT DETECTED Final   Candida glabrata NOT DETECTED NOT DETECTED Final   Candida krusei NOT DETECTED NOT DETECTED Final   Candida parapsilosis NOT DETECTED NOT DETECTED Final   Candida tropicalis NOT DETECTED NOT DETECTED Final    Comment: Performed at [facility], [address] [place], [place] [address]  Urine culture     Status: Abnormal   Collection Time: 03/31/19 10:13 PM   Specimen: Urine, Random  Result Value Ref Range Status   Specimen Description   Final    URINE, RANDOM Performed at [facility], [address], [place], [place] [address]    Special Requests   Final    NONE Performed at [facility], [address] [place], [place] [address]    Culture MULTIPLE SPECIES PRESENT, SUGGEST RECOLLECTION (A)  Final   Report Status 04/02/2019 FINAL  Final  MRSA PCR Screening     Status: None   Collection Time: 04/01/19  1:54 AM    Specimen: Nasopharyngeal  Result Value Ref Range Status   MRSA by PCR NEGATIVE NEGATIVE Final    Comment:        The GeneXpert MRSA Assay (FDA approved for NASAL specimens only), is one component of a comprehensive MRSA colonization surveillance program. It is not intended to diagnose MRSA infection nor to guide or monitor treatment for MRSA infections. Performed at [facility], [address] [place], [place] [address]   CULTURE, BLOOD (ROUTINE X 2) w Reflex to ID Panel     Status: None (Preliminary result)   Collection Time: 04/02/19  3:05 PM   Specimen: BLOOD RIGHT WRIST  Result Value Ref Range Status   Specimen Description BLOOD RIGHT WRIST  Final   Special Requests NONE  Final   Culture   Final    NO GROWTH 4 DAYS Performed at [facility], [address], [place], [place] [address]    Report Status PENDING  Incomplete  Culture, blood (Routine X 2) w Reflex to ID Panel     Status: None (Preliminary result)   Collection Time: 04/02/19 10:45 PM   Specimen: BLOOD  Result Value Ref Range Status   Specimen Description BLOOD RIGHT ASSIST CONTROL  Final   Special Requests   Final    BOTTLES DRAWN AEROBIC AND ANAEROBIC Blood Culture adequate volume   Culture   Final    NO GROWTH 4 DAYS Performed at [facility], [address] [place], [place] [address]    Report Status PENDING  Incomplete  CULTURE, BLOOD (ROUTINE X 2) w Reflex to ID Panel     Status: None (Preliminary result)   Collection Time: 04/05/19  1:33 PM   Specimen: BLOOD RIGHT ARM  Result Value Ref Range Status   Specimen Description BLOOD RIGHT ARM  Final   Special Requests   Final    BOTTLES DRAWN AEROBIC AND ANAEROBIC Blood Culture results may not be optimal due to an excessive volume of blood received in culture bottles   Culture   Final    NO GROWTH < 24 HOURS Performed at [facility], [address] [place], [place] [address]    Report Status PENDING   Incomplete  CULTURE, BLOOD (ROUTINE X 2) w Reflex to ID Panel     Status: None (Preliminary result)   Collection Time: 04/05/19  1:42 PM   Specimen: BLOOD LEFT ARM  Result Value Ref Range Status   Specimen Description BLOOD LEFT ARM  Final   Special Requests   Final    BOTTLES DRAWN AEROBIC AND ANAEROBIC Blood Culture results may not be optimal due to an excessive volume of blood received in culture bottles   Culture   Final    NO GROWTH < 24 HOURS Performed at [facility], [address], [place],  [address]    Report Status PENDING  Incomplete     Studies: Dg Chest Port 1 View  Result Date: 04/05/2019 CLINICAL DATA:  Fever.  History of lung cancer. EXAM: PORTABLE CHEST 1 VIEW COMPARISON:  Chest x-ray from yesterday. FINDINGS: Stable cardiomediastinal silhouette. Persistent low lung volumes with bilateral infiltrates, increasing in the right upper lobe. No pleural effusion or pneumothorax. No acute osseous abnormality. IMPRESSION: 1. Bilateral infiltrates, worsened in the right upper lobe. Electronically Signed   By: Titus Dubin M.D.   On: 04/05/2019 15:28    Scheduled Meds: . amLODipine  5 mg Oral Daily  . folic acid  1 mg Intravenous Daily  . ipratropium-albuterol  3 mL Nebulization Q6H  . LORazepam  0.5 mg Intravenous Q6H  . mouth rinse  15 mL Mouth Rinse BID  . multivitamin with minerals  1 tablet Oral Daily  . pantoprazole (PROTONIX) IV  40 mg Intravenous Q12H  . sodium chloride flush  3 mL Intravenous Q12H  . thiamine  100 mg Oral Daily   Or  . thiamine  100 mg Intravenous Daily  . vitamin C  500 mg Oral BID   Continuous Infusions: . sodium chloride Stopped (04/06/19 0201)  . ceFEPime (MAXIPIME) IV 200 mL/hr at 04/06/19 1256  . metronidazole    . potassium PHOSPHATE IVPB (in mmol) 85 mL/hr at 04/06/19 1256  . vancomycin Stopped (04/06/19 0158)    Assessment/Plan:  1. Staph epidermidis bacteremia on vancomycin.  Echocardiogram negative.  Spoke  with cardiology and they do not want to do a TEE.  Spoke with infectious disease specialist and we will do 2 to 4 weeks of IV antibiotics.  Continue to watch temperature curve. 2. Aspiration pneumonia.  Repeat chest x-ray showed worsening aspiration.  Infectious disease changed antibiotics to cefepime and Flagyl. 3. Acute blood loss anemia with GI bleed.  Recent endoscopy with peptic ulcer disease and gastritis.  On Protonix. 4. Hypokalemia and hypophosphatemia.  K-Phos replaced. 5. Acute rhabdomyolysis improved with IV fluids 6. Alcohol withdrawal with hallucinations on alcohol withdrawal protocol.  Nurse asking for some standing dose Ativan.  Ammonia level 18. 7. Hypertension on Norvasc  Code Status:     Code Status Orders  (From admission, onward)         Start     Ordered   03/31/19 2341  Full code  Continuous     03/31/19 2340        Code Status History    Date Active Date Inactive Code Status Order ID Comments User Context   12/09/2017 1336 12/12/2017 1845 Full Code [id number]  Dustin Flock, MD Inpatient   Advance Care Planning Activity     Family Communication: Spoke with the patient's friend yesterday evening. Disposition Plan: No plan at this point in time  Consultants:  Infectious disease  Antibiotics:  Vancomycin  Unasyn  Time spent: 27 minutes  [place]

## 2019-04-06 NOTE — Consult Note (Addendum)
PHARMACY CONSULT NOTE - FOLLOW UP  Pharmacy Consult for Electrolyte Monitoring and Replacement   Recent Labs: Potassium (mmol/L)  Date Value  04/06/2019 3.4 (L)   Magnesium (mg/dL)  Date Value  04/05/2019 1.9   Calcium (mg/dL)  Date Value  04/06/2019 7.5 (L)   Albumin (g/dL)  Date Value  04/04/2019 2.3 (L)   Phosphorus (mg/dL)  Date Value  04/06/2019 1.5 (L)   Sodium (mmol/L)  Date Value  04/06/2019 139   Corrected Calcium 9.26  Assessment: RQ is 62 YOM admitted for pneumonia and GI bleed on 03/31/19. Past medical history for alcohol abuse and patient is currently under CIWA protocol.  -Hypokalemic  - K 3.5>3.9>3.2>2.9> 3.4>3.7> 3.4  -Mg 2.2 > 1.9  -Phos 1.3, now 1.5  Goal of Therapy:  Potassium 4 Magnesium 2 Phosphorus 2.5  Plan: Will give 30 mmol Potassium Phosphate  Will give KCl 10 meq IV x 2  Will recheck electrolytes with AM labs.   Pharmacy will continue to monitor and adjust accordingly.  Lu Duffel, PharmD, BCPS Clinical Pharmacist 04/06/2019 9:03 AM

## 2019-04-07 ENCOUNTER — Inpatient Hospital Stay: Payer: Self-pay

## 2019-04-07 DIAGNOSIS — F1020 Alcohol dependence, uncomplicated: Secondary | ICD-10-CM

## 2019-04-07 LAB — COMPREHENSIVE METABOLIC PANEL
ALT: 76 U/L — ABNORMAL HIGH (ref 0–44)
AST: 80 U/L — ABNORMAL HIGH (ref 15–41)
Albumin: 2 g/dL — ABNORMAL LOW (ref 3.5–5.0)
Alkaline Phosphatase: 213 U/L — ABNORMAL HIGH (ref 38–126)
Anion gap: 8 (ref 5–15)
BUN: 9 mg/dL (ref 8–23)
CO2: 21 mmol/L — ABNORMAL LOW (ref 22–32)
Calcium: 6.9 mg/dL — ABNORMAL LOW (ref 8.9–10.3)
Chloride: 110 mmol/L (ref 98–111)
Creatinine, Ser: 0.47 mg/dL — ABNORMAL LOW (ref 0.61–1.24)
GFR calc Af Amer: >60 mL/min (ref >60)
GFR calc non Af Amer: >60 mL/min (ref >60)
Glucose, Bld: 97 mg/dL (ref 70–99)
Potassium: 3.7 mmol/L (ref 3.5–5.1)
Sodium: 139 mmol/L (ref 135–145)
Total Bilirubin: 3.6 mg/dL — ABNORMAL HIGH (ref 0.3–1.2)
Total Protein: 5 g/dL — ABNORMAL LOW (ref 6.5–8.1)

## 2019-04-07 LAB — BLOOD GAS, ARTERIAL
Acid-base deficit: 1.1 mmol/L (ref 0.0–2.0)
Bicarbonate: 25.3 mmol/L (ref 20.0–28.0)
FIO2: 0.44
O2 Saturation: 91.2 %
Patient temperature: 37
pCO2 arterial: 48 mmHg (ref 32.0–48.0)
pH, Arterial: 7.33 — ABNORMAL LOW (ref 7.350–7.450)
pO2, Arterial: 66 mmHg — ABNORMAL LOW (ref 83.0–108.0)

## 2019-04-07 LAB — CBC
HCT: 25.1 % — ABNORMAL LOW (ref 39.0–52.0)
HCT: 29.1 % — ABNORMAL LOW (ref 39.0–52.0)
Hemoglobin: 7.1 g/dL — ABNORMAL LOW (ref 13.0–17.0)
Hemoglobin: 7.8 g/dL — ABNORMAL LOW (ref 13.0–17.0)
MCH: 22.8 pg — ABNORMAL LOW (ref 26.0–34.0)
MCH: 22.4 pg — ABNORMAL LOW (ref 26.0–34.0)
MCHC: 28.3 g/dL — ABNORMAL LOW (ref 30.0–36.0)
MCHC: 26.8 g/dL — ABNORMAL LOW (ref 30.0–36.0)
MCV: 80.4 fL (ref 80.0–100.0)
MCV: 83.6 fL (ref 80.0–100.0)
Platelets: 282 10*3/uL (ref 150–400)
Platelets: 277 10*3/uL (ref 150–400)
RBC: 3.12 MIL/uL — ABNORMAL LOW (ref 4.22–5.81)
RBC: 3.48 MIL/uL — ABNORMAL LOW (ref 4.22–5.81)
RDW: 29.2 % — ABNORMAL HIGH (ref 11.5–15.5)
RDW: 28.5 % — ABNORMAL HIGH (ref 11.5–15.5)
WBC: 12.7 10*3/uL — ABNORMAL HIGH (ref 4.0–10.5)
WBC: 11.7 10*3/uL — ABNORMAL HIGH (ref 4.0–10.5)
nRBC: 0.5 % — ABNORMAL HIGH (ref 0.0–0.2)
nRBC: 0.3 % — ABNORMAL HIGH (ref 0.0–0.2)

## 2019-04-07 LAB — CULTURE, BLOOD (ROUTINE X 2)
Culture: NO GROWTH
Culture: NO GROWTH
Special Requests: ADEQUATE

## 2019-04-07 LAB — BRAIN NATRIURETIC PEPTIDE: B Natriuretic Peptide: 528 pg/mL — ABNORMAL HIGH (ref 0.0–100.0)

## 2019-04-07 LAB — AMMONIA: Ammonia: 30 umol/L (ref 9–35)

## 2019-04-07 LAB — MRSA PCR SCREENING: MRSA by PCR: NEGATIVE

## 2019-04-07 LAB — PHOSPHORUS: Phosphorus: 2.2 mg/dL — ABNORMAL LOW (ref 2.5–4.6)

## 2019-04-07 LAB — MAGNESIUM: Magnesium: 2 mg/dL (ref 1.7–2.4)

## 2019-04-07 LAB — GLUCOSE, CAPILLARY: Glucose-Capillary: 87 mg/dL (ref 70–99)

## 2019-04-07 MED ORDER — POTASSIUM PHOSPHATES 15 MMOLE/5ML IV SOLN
10.0000 mmol | Freq: Once | INTRAVENOUS | Status: AC
  Administered 2019-04-07: 09:00:00 10 mmol via INTRAVENOUS
  Filled 2019-04-07: qty 3.33

## 2019-04-07 MED ORDER — NIACIN 500 MG PO TABS
500.0000 mg | ORAL_TABLET | Freq: Every day | ORAL | Status: DC
  Administered 2019-04-08 – 2019-04-26 (×19): 500 mg via ORAL
  Filled 2019-04-07 (×23): qty 1

## 2019-04-07 MED ORDER — FUROSEMIDE 10 MG/ML IJ SOLN
20.0000 mg | Freq: Once | INTRAMUSCULAR | Status: AC
  Administered 2019-04-07: 20 mg via INTRAVENOUS

## 2019-04-07 MED ORDER — THIAMINE HCL 100 MG/ML IJ SOLN
500.0000 mg | INTRAVENOUS | Status: AC
  Administered 2019-04-07 – 2019-04-11 (×5): 500 mg via INTRAVENOUS
  Filled 2019-04-07 (×6): qty 5

## 2019-04-07 MED ORDER — SODIUM CHLORIDE 0.9 % IV SOLN
2.0000 g | INTRAVENOUS | Status: AC
  Administered 2019-04-07 – 2019-04-13 (×7): 2 g via INTRAVENOUS
  Filled 2019-04-07: qty 2
  Filled 2019-04-07: qty 20
  Filled 2019-04-07 (×4): qty 2
  Filled 2019-04-07: qty 20

## 2019-04-07 MED ORDER — FUROSEMIDE 10 MG/ML IJ SOLN
INTRAMUSCULAR | Status: AC
  Administered 2019-04-07: 17:00:00 20 mg via INTRAVENOUS
  Filled 2019-04-07: qty 2

## 2019-04-07 MED ORDER — CLONIDINE HCL 0.1 MG/24HR TD PTWK: 0.1000 mg | MEDICATED_PATCH | TRANSDERMAL | Status: DC

## 2019-04-07 MED ORDER — K PHOS MONO-SOD PHOS DI & MONO 155-852-130 MG PO TABS
500.0000 mg | ORAL_TABLET | Freq: Three times a day (TID) | ORAL | Status: DC
  Filled 2019-04-07 (×2): qty 2

## 2019-04-07 MED ORDER — HALOPERIDOL LACTATE 5 MG/ML IJ SOLN
1.0000 mg | Freq: Four times a day (QID) | INTRAMUSCULAR | Status: DC | PRN
  Administered 2019-04-07: 1 mg via INTRAVENOUS
  Filled 2019-04-07 (×2): qty 1

## 2019-04-07 NOTE — Evaluation (Signed)
SLP Cancellation Note  Patient Details Name: George Wade MRN: [id number] DOB: 06-24-1956   Cancelled treatment:       Reason Eval/Treat Not Completed: Medical issues which prohibited therapy ST will follow up as able. Pt recently moved to ICU with [place] 04/07/2019, 9:56 AM

## 2019-04-07 NOTE — Consult Note (Addendum)
PHARMACY CONSULT NOTE - FOLLOW UP  Pharmacy Consult for Electrolyte Monitoring and Replacement   Recent Labs: Potassium (mmol/L)  Date Value  04/07/2019 3.7   Magnesium (mg/dL)  Date Value  04/07/2019 2.0   Calcium (mg/dL)  Date Value  04/07/2019 6.9 (L)   Albumin (g/dL)  Date Value  04/07/2019 2.0 (L)   Phosphorus (mg/dL)  Date Value  04/07/2019 2.2 (L)   Sodium (mmol/L)  Date Value  04/07/2019 139   Corrected Calcium 8.5  Assessment: RQ is 62 YOM admitted for pneumonia and GI bleed on 03/31/19. Past medical history for alcohol abuse and patient is currently under CIWA protocol.  -Hypokalemic  - K 3.5>3.9>3.2>2.9> 3.4>3.7  -Mg 2.2 > 1.9>> 2.0  -Phos 1.3>>1.5>> 2.2  Goal of Therapy:  Potassium ~4 Magnesium ~ 2 Phosphorus ~2.5  Plan: Patient not taking oral meds at this time.   Will order KPhos 42mmol IV x 1 dose.   Will recheck electrolytes with AM labs.   Pharmacy will continue to monitor and adjust accordingly.  Pernell Dupre, PharmD, BCPS Clinical Pharmacist 04/07/2019 5:28 AM

## 2019-04-07 NOTE — Progress Notes (Signed)
Patient ID: Batu Cassin, male   DOB: 1955/12/22, 63 y.o.   MRN: [id number]  [facility] PROGRESS NOTE  Colon Rueth NGE:[id number] DOB: 13-Nov-1955 DOA: 03/31/2019 PCP: Patient, [name] Per  HPI/Subjective: Patient is a very lethargic and hypoxemic today.  Tachypneic requiring 6 L of oxygen.  Patient was transferred to ICU for BiPAP Objective: Vitals:   04/07/19 1100 04/07/19 1132  BP: 131/86   Pulse: 100   Resp: (!) 36   Temp:    SpO2: 95% 97%    Filed Weights   03/31/19 1912 04/07/19 0906  Weight: 78 kg 82.3 kg    ROS: Review of Systems  Unable to perform ROS: Acuity of condition  Respiratory: Positive for cough and shortness of breath.   Cardiovascular: Negative for chest pain.  Gastrointestinal: Negative for abdominal pain.   Exam: Physical Exam  HENT:  Nose: No mucosal edema.  Mouth/Throat: No oropharyngeal exudate or posterior oropharyngeal edema.  Eyes: Pupils are equal, round, and reactive to light. Conjunctivae, EOM and lids are normal.  Neck: No JVD present. Carotid bruit is not present. No edema present. No thyroid mass and no thyromegaly present.  Cardiovascular: S1 normal and S2 normal. Exam reveals no gallop.  No murmur heard. Pulses:      Dorsalis pedis pulses are 2+ on the right side and 2+ on the left side.  Respiratory: He is in respiratory distress. He has decreased breath sounds in the right lower field and the left lower field. He has no wheezes. He has rhonchi in the right lower field and the left lower field. He has no rales.  Positive crackles bilaterally, diminished breath sounds  GI: Soft. Bowel sounds are normal. There is no abdominal tenderness.  Musculoskeletal:     Right ankle: He exhibits swelling.     Left ankle: He exhibits swelling.  Lymphadenopathy:    He has no cervical adenopathy.  Neurological: He is alert.  Skin: Skin is warm. Nails show no clubbing.  Skin abrasions bilateral knees.  Psychiatric: His affect is blunt.       Data Reviewed: Basic Metabolic Panel: Recent Labs  Lab 03/31/19 1950  04/03/19 0422 04/04/19 4010 04/04/19 1754 04/05/19 0424 04/06/19 0742 04/07/19 0458  NA 128*   < > 143 132*  --  137 139 139  K 3.2*   < > 3.2* 2.9* 3.4* 3.7 3.4* 3.7  CL 78*   < > 105 99  --  104 105 110  CO2 12*   < > 29 27  --  24 24 21*  GLUCOSE 82   < > 113* 124*  --  110* 103* 97  BUN 17   < > 18 7*  --  <5* 7* 9  CREATININE 1.54*   < > 0.58* 0.47*  --  0.39* 0.42* 0.47*  CALCIUM 13.4*   < > 9.1 8.2*  --  7.9* 7.5* 6.9*  MG 2.2  --   --   --  1.9 1.9  --  2.0  PHOS  --   --   --   --   --  1.3* 1.5* 2.2*   < > = values in this interval not displayed.   Liver Function Tests: Recent Labs  Lab 03/31/19 1950 04/02/19 0556 04/04/19 0307 04/07/19 0458  AST 213* 207* 201* 80*  ALT 98* 98* 125* 76*  ALKPHOS 280* 166* 201* 213*  BILITOT 1.9* 2.2* 4.1* 3.6*  PROT 6.3* 5.0* 5.1* 5.0*  ALBUMIN 2.9* 2.3* 2.3*  2.0*   CBC: Recent Labs  Lab 03/31/19 1950  04/02/19 0556 04/03/19 0422 04/04/19 0307 04/05/19 0424 04/07/19 0458  WBC 23.0*   < > 10.3 9.5 11.5* 16.5* 12.7*  NEUTROABS 22.1*  --   --   --   --   --   --   HGB 6.3*   < > 8.0* 8.0* 7.9* 8.4* 7.1*  HCT 25.7*   < > 28.9* 29.4* 27.9* 29.9* 25.1*  MCV 80.6   < > 81.6 83.3 79.0* 80.8 80.4  PLT 386   < > 187 148* 152 206 282   < > = values in this interval not displayed.   Cardiac Enzymes: Recent Labs  Lab 03/31/19 1950 04/01/19 0302 04/02/19 0556  CKTOTAL 1,674* 1,017* 347  TROPONINI 0.04*  --   --     CBG: Recent Labs  Lab 04/02/19 0604 04/02/19 1139 04/03/19 0009 04/03/19 0603 04/07/19 0901  GLUCAP 121* 118* 107* 104* 87    Recent Results (from the past 240 hour(s))  SARS Coronavirus 2 (CEPHEID - Performed in [facility] hospital lab), Hosp Order     Status: None   Collection Time: 03/31/19  8:43 PM   Specimen: Nasopharyngeal Swab  Result Value Ref Range Status   SARS Coronavirus 2 NEGATIVE NEGATIVE Final     Comment: (NOTE) If result is NEGATIVE SARS-CoV-2 target nucleic acids are NOT DETECTED. The SARS-CoV-2 RNA is generally detectable in upper and lower  respiratory specimens during the acute phase of infection. The lowest  concentration of SARS-CoV-2 viral copies this assay can detect is 250  copies / mL. A negative result does not preclude SARS-CoV-2 infection  and should not be used as the sole basis for treatment or other  patient management decisions.  A negative result may occur with  improper specimen collection / handling, submission of specimen other  than nasopharyngeal swab, presence of viral mutation(s) within the  areas targeted by this assay, and inadequate number of viral copies  (<250 copies / mL). A negative result must be combined with clinical  observations, patient history, and epidemiological information. If result is POSITIVE SARS-CoV-2 target nucleic acids are DETECTED. The SARS-CoV-2 RNA is generally detectable in upper and lower  respiratory specimens dur ing the acute phase of infection.  Positive  results are indicative of active infection with SARS-CoV-2.  Clinical  correlation with patient history and other diagnostic information is  necessary to determine patient infection status.  Positive results do  not rule out bacterial infection or co-infection with other viruses. If result is PRESUMPTIVE POSTIVE SARS-CoV-2 nucleic acids MAY BE PRESENT.   A presumptive positive result was obtained on the submitted specimen  and confirmed on repeat testing.  While 2019 novel coronavirus  (SARS-CoV-2) nucleic acids may be present in the submitted sample  additional confirmatory testing may be necessary for epidemiological  and / or clinical management purposes  to differentiate between  SARS-CoV-2 and other Sarbecovirus currently known to infect humans.  If clinically indicated additional testing with an alternate test  methodology [phone]) is advised. The SARS-CoV-2  RNA is generally  detectable in upper and lower respiratory sp ecimens during the acute  phase of infection. The expected result is Negative. Fact Sheet for Patients:  StrictlyIdeas.no Fact Sheet for Healthcare Providers: BankingDealers.co.za This test is not yet approved or cleared by the [place] FDA and has been authorized for detection and/or diagnosis of SARS-CoV-2 by FDA under an Emergency Use Authorization (EUA).  This EUA  will remain in effect (meaning this test can be used) for the duration of the COVID-19 declaration under Section 564(b)(1) of the Act, 21 U.S.C. section 360bbb-3(b)(1), unless the authorization is terminated or revoked sooner. Performed at [facility], [address] [place], [place] [address]   Culture, blood (routine x 2)     Status: Abnormal   Collection Time: 03/31/19  8:43 PM   Specimen: BLOOD  Result Value Ref Range Status   Specimen Description   Final    BLOOD LEFT ANTECUBITAL Performed at [facility], [address], [place], [place] [address]    Special Requests   Final    BOTTLES DRAWN AEROBIC AND ANAEROBIC Blood Culture adequate volume Performed at [facility], [address] [place], [place] [address]    Culture  Setup Time   Final    GRAM POSITIVE COCCI AEROBIC BOTTLE ONLY CRITICAL VALUE NOTED.  VALUE IS CONSISTENT WITH PREVIOUSLY REPORTED AND CALLED VALUE. [place] Performed at [facility], [place] [address], [place], [place] [address]    Culture STAPHYLOCOCCUS EPIDERMIDIS (A)  Final   Report Status 04/04/2019 FINAL  Final   Organism ID, Bacteria STAPHYLOCOCCUS EPIDERMIDIS  Final      Susceptibility   Staphylococcus epidermidis - MIC*    CIPROFLOXACIN <=0.5 SENSITIVE Sensitive     ERYTHROMYCIN >=8 RESISTANT Resistant     GENTAMICIN <=0.5 SENSITIVE Sensitive     OXACILLIN >=4 RESISTANT Resistant      TETRACYCLINE <=1 SENSITIVE Sensitive     VANCOMYCIN 2 SENSITIVE Sensitive     TRIMETH/SULFA <=10 SENSITIVE Sensitive     CLINDAMYCIN <=0.25 SENSITIVE Sensitive     RIFAMPIN <=0.5 SENSITIVE Sensitive     Inducible Clindamycin NEGATIVE Sensitive     * STAPHYLOCOCCUS EPIDERMIDIS  Culture, blood (routine x 2)     Status: Abnormal   Collection Time: 03/31/19  8:43 PM   Specimen: BLOOD  Result Value Ref Range Status   Specimen Description   Final    BLOOD RIGHT ANTECUBITAL Performed at [facility], [address] [place], [place] [address]    Special Requests   Final    BOTTLES DRAWN AEROBIC AND ANAEROBIC Blood Culture adequate volume Performed at [facility], [address] [place], [place] [address]    Culture  Setup Time   Final    GRAM POSITIVE COCCI IN BOTH AEROBIC AND ANAEROBIC BOTTLES CRITICAL RESULT CALLED TO, READ BACK BY AND VERIFIED WITH: MYRA SLAUGHTER ON 04/01/2019 AT 1344 QSD GRAM STAIN REVIEWED-AGREE WITH RESULT T. TYSOR    Culture (A)  Final    STAPHYLOCOCCUS EPIDERMIDIS SUSCEPTIBILITIES PERFORMED ON PREVIOUS CULTURE WITHIN THE LAST 5 DAYS. Performed at [facility], [place] [address], [place], [place] [address]    Report Status 04/04/2019 FINAL  Final  Blood Culture ID Panel (Reflexed)     Status: Abnormal   Collection Time: 03/31/19  8:43 PM  Result Value Ref Range Status   Enterococcus species NOT DETECTED NOT DETECTED Final   Listeria monocytogenes NOT DETECTED NOT DETECTED Final   Staphylococcus species DETECTED (A) NOT DETECTED Final    Comment: Methicillin (oxacillin) resistant coagulase negative staphylococcus. Possible blood culture contaminant (unless isolated from more than one blood culture draw or clinical case suggests pathogenicity). No antibiotic treatment is indicated for blood  culture contaminants. CRITICAL RESULT CALLED TO, READ BACK BY AND VERIFIED WITH: MYRA SLAUGHTER ON 04/01/2019 AT 1344 QSD  Staphylococcus aureus  (BCID) NOT DETECTED NOT DETECTED Final   Methicillin resistance DETECTED (A) NOT DETECTED Final    Comment: CRITICAL RESULT CALLED TO, READ BACK BY AND VERIFIED WITH: MYRA SLAUGHTER ON 04/01/2019 AT 1344 QSD    Streptococcus species NOT DETECTED NOT DETECTED Final   Streptococcus agalactiae NOT DETECTED NOT DETECTED Final   Streptococcus pneumoniae NOT DETECTED NOT DETECTED Final   Streptococcus pyogenes NOT DETECTED NOT DETECTED Final   Acinetobacter baumannii NOT DETECTED NOT DETECTED Final   Enterobacteriaceae species NOT DETECTED NOT DETECTED Final   Enterobacter cloacae complex NOT DETECTED NOT DETECTED Final   Escherichia coli NOT DETECTED NOT DETECTED Final   Klebsiella oxytoca NOT DETECTED NOT DETECTED Final   Klebsiella pneumoniae NOT DETECTED NOT DETECTED Final   Proteus species NOT DETECTED NOT DETECTED Final   Serratia marcescens NOT DETECTED NOT DETECTED Final   Haemophilus influenzae NOT DETECTED NOT DETECTED Final   Neisseria meningitidis NOT DETECTED NOT DETECTED Final   Pseudomonas aeruginosa NOT DETECTED NOT DETECTED Final   Candida albicans NOT DETECTED NOT DETECTED Final   Candida glabrata NOT DETECTED NOT DETECTED Final   Candida krusei NOT DETECTED NOT DETECTED Final   Candida parapsilosis NOT DETECTED NOT DETECTED Final   Candida tropicalis NOT DETECTED NOT DETECTED Final    Comment: Performed at [facility], [address], [place], [place] [address]  Urine culture     Status: Abnormal   Collection Time: 03/31/19 10:13 PM   Specimen: Urine, Random  Result Value Ref Range Status   Specimen Description   Final    URINE, RANDOM Performed at [facility] [address], [place], [place] [address]    Special Requests   Final    NONE Performed at [facility], [address] [place], [place] [address]    Culture MULTIPLE SPECIES PRESENT, SUGGEST RECOLLECTION (A)  Final   Report Status 04/02/2019 FINAL  Final  MRSA PCR Screening      Status: None   Collection Time: 04/01/19  1:54 AM   Specimen: Nasopharyngeal  Result Value Ref Range Status   MRSA by PCR NEGATIVE NEGATIVE Final    Comment:        The GeneXpert MRSA Assay (FDA approved for NASAL specimens only), is one component of a comprehensive MRSA colonization surveillance program. It is not intended to diagnose MRSA infection nor to guide or monitor treatment for MRSA infections. Performed at [facility], [address] [place], [place] [address]   CULTURE, BLOOD (ROUTINE X 2) w Reflex to ID Panel     Status: None   Collection Time: 04/02/19  3:05 PM   Specimen: BLOOD RIGHT WRIST  Result Value Ref Range Status   Specimen Description BLOOD RIGHT WRIST  Final   Special Requests NONE  Final   Culture   Final    NO GROWTH 5 DAYS Performed at [facility], [address] [place], [place] [address]    Report Status 04/07/2019 FINAL  Final  Culture, blood (Routine X 2) w Reflex to ID Panel     Status: None   Collection Time: 04/02/19 10:45 PM   Specimen: BLOOD  Result Value Ref Range Status   Specimen Description BLOOD RIGHT ASSIST CONTROL  Final   Special Requests   Final    BOTTLES DRAWN AEROBIC AND ANAEROBIC Blood Culture adequate volume   Culture   Final    NO GROWTH 5 DAYS Performed at [facility], [place]  [address] [place], [place] [address]    Report Status 04/07/2019 FINAL  Final  CULTURE, BLOOD (ROUTINE X 2) w Reflex to ID Panel     Status: None (Preliminary result)   Collection Time: 04/05/19  1:33 PM   Specimen: BLOOD RIGHT ARM  Result Value Ref Range Status   Specimen Description BLOOD RIGHT ARM  Final   Special Requests   Final    BOTTLES DRAWN AEROBIC AND ANAEROBIC Blood Culture results may not be optimal due to an excessive volume of blood received in culture bottles   Culture   Final    NO GROWTH 2 DAYS Performed at [facility], [address], [place], [place] [address]    Report  Status PENDING  Incomplete  CULTURE, BLOOD (ROUTINE X 2) w Reflex to ID Panel     Status: None (Preliminary result)   Collection Time: 04/05/19  1:42 PM   Specimen: BLOOD LEFT ARM  Result Value Ref Range Status   Specimen Description BLOOD LEFT ARM  Final   Special Requests   Final    BOTTLES DRAWN AEROBIC AND ANAEROBIC Blood Culture results may not be optimal due to an excessive volume of blood received in culture bottles   Culture   Final    NO GROWTH 2 DAYS Performed at [facility], [address], [place], [place] [address]    Report Status PENDING  Incomplete  MRSA PCR Screening     Status: None   Collection Time: 04/07/19  9:08 AM   Specimen: Nasopharyngeal  Result Value Ref Range Status   MRSA by PCR NEGATIVE NEGATIVE Final    Comment:        The GeneXpert MRSA Assay (FDA approved for NASAL specimens only), is one component of a comprehensive MRSA colonization surveillance program. It is not intended to diagnose MRSA infection nor to guide or monitor treatment for MRSA infections. Performed at [facility], [address] [place], [place] [address]      Studies: Dg Chest [facility] 1 View  Result Date: 04/07/2019 CLINICAL DATA:  Hypoxia.  Reported history of lung carcinoma EXAM: PORTABLE CHEST 1 VIEW COMPARISON:  April 05, 2019 FINDINGS: There is widespread airspace consolidation throughout the right lung. There is patchy infiltrate in the left base which is stable. There is new patchy infiltrate in the left upper lobe. Heart is mildly enlarged with pulmonary vascularity normal. No adenopathy. There is aortic atherosclerosis. No bone lesions. IMPRESSION: Widespread airspace opacity consistent with multifocal pneumonia, essentially stable the right and in the left base. New patchy infiltrate left upper lobe. Stable cardiomegaly.  Aortic Atherosclerosis (ICD10-I70.0). Electronically Signed   By: Lowella Grip III M.D.   On: 04/07/2019 11:34   Dg Chest Port  1 View  Result Date: 04/05/2019 CLINICAL DATA:  Fever.  History of lung cancer. EXAM: PORTABLE CHEST 1 VIEW COMPARISON:  Chest x-ray from yesterday. FINDINGS: Stable cardiomediastinal silhouette. Persistent low lung volumes with bilateral infiltrates, increasing in the right upper lobe. No pleural effusion or pneumothorax. No acute osseous abnormality. IMPRESSION: 1. Bilateral infiltrates, worsened in the right upper lobe. Electronically Signed   By: Titus Dubin M.D.   On: 04/05/2019 15:28    Scheduled Meds: . amLODipine  5 mg Oral Daily  . folic acid  1 mg Intravenous Daily  . ipratropium-albuterol  3 mL Nebulization Q6H  . mouth rinse  15 mL Mouth Rinse BID  . multivitamin with minerals  1 tablet Oral Daily  . pantoprazole (  PROTONIX) IV  40 mg Intravenous Q12H  . sodium chloride flush  3 mL Intravenous Q12H  . thiamine  100 mg Oral Daily   Or  . thiamine  100 mg Intravenous Daily  . vitamin C  500 mg Oral BID   Continuous Infusions: . sodium chloride Stopped (04/06/19 0201)  . cefTRIAXone (ROCEPHIN)  IV    . potassium PHOSPHATE IVPB (in mmol) 10 mmol (04/07/19 0843)  . vancomycin Stopped (04/07/19 0318)    Assessment/Plan:  1. Acute respiratory failure with hypoxia from multifocal pneumonia-healthcare associated pneumonia.  Transfer patient to  stepdown unit for BiPAP.  On vancomycin and intensivist added Rocephin.  Bronchodilator treatments as needed.  Discussed with Dr. Patsey Berthold intensivist 2. Staph epidermidis bacteremia on vancomycin.  Echocardiogram negative.  Spoke with cardiology and they do not want to do a TEE.  Spoke with infectious disease specialist and we will do 2 to 4 weeks of IV antibiotics.  Continue to watch temperature curve. 3. Aspiration pneumonia.  Repeat chest x-ray showed worsening aspiration.  Infectious disease is following.  Appreciate their recommendations 4. Acute blood loss anemia with GI bleed.  Recent endoscopy with peptic ulcer disease and  gastritis.  On Protonix. 5. Hypokalemia and hypophosphatemia.  Replete and recheck 6. Acute rhabdomyolysis improved with IV fluids 7. Alcohol withdrawal with hallucinations on alcohol withdrawal protocol.  Nurse asking for some standing dose Ativan.  Ammonia level 18. 8. Hypertension on Norvasc  Plan discussed with intensivist  Code Status:     Code Status Orders  (From admission, onward)         Start     Ordered   03/31/19 2341  Full code  Continuous     03/31/19 2340        Code Status History    Date Active Date Inactive Code Status Order ID Comments User Context   12/09/2017 1336 12/12/2017 1845 Full Code [id number]  Dustin Flock, MD Inpatient   Advance Care Planning Activity     Family Communication: Spoke with the patient's friend Minda Ditto.  He endorsed that patient does not have any family members who is willing to take care of him and also he refused to be his healthcare power of attorney as it is overwhelming .  Will consult social worker Disposition Plan: No plan at this point in time  Consultants:  Infectious disease  Antibiotics:  Vancomycin  Critical care time spent: 41minutes  Illene Silver [employer]  [employer]

## 2019-04-07 NOTE — Progress Notes (Addendum)
Follow up - Critical Care Medicine Note  Patient Details:    George Wade is an 63 y.o. male admitted to [facility] on 31 March 2019 after being found down.  He has a history of heavy alcohol use.  He has been evaluated by PCCM at the beginning of his admission when he was in the ICU.  He was noted to have issues with encephalopathy and delirium.  He remains encephalopathic and delirious.  He has also been noted to have staph epi bacteremia and is being treated with vancomycin.  He has issues with dysphagia due to his persistent encephalopathy and has had recurrent aspiration.  Currently he is n.p.o.  Suspect that he is aspirating his own secretions.  Today he presented back to the stepdown unit due to increased respiratory distress and encephalopathy.  He is requiring BiPAP.  Of note he had his antibiotics changed yesterday and has been on cefepime and metronidazole.  Both of these antibiotics can worsen encephalopathy.  Patient cannot provide history.  Review of systems cannot be obtained.  Note also the patient has had recent GI bleed due to erosive esophagitis.  This has resolved.  Patient has no next of kin.  He has a friend as a contact who has actually severed all ties with the patient.  Lines, Airways, Drains: External Urinary Catheter (Active)  Collection Container Standard drainage bag 04/07/19 0906  Securement Method Other (Comment) 04/07/19 0906  Intervention Equipment Changed 04/06/19 0936  Output (mL) 450 mL 04/07/19 1942    Anti-infectives:  Anti-infectives (From admission, onward)   Start     Dose/Rate Route Frequency Ordered Stop   04/07/19 1100  cefTRIAXone (ROCEPHIN) 2 g in sodium chloride 0.9 % 100 mL IVPB     2 g 200 mL/hr over 30 Minutes Intravenous Every 24 hours 04/07/19 1052     04/06/19 1800  metroNIDAZOLE (FLAGYL) IVPB 500 mg  Status:  Discontinued     500 mg 100 mL/hr over 60 Minutes Intravenous Every 8 hours 04/06/19 1102 04/07/19 1039   04/06/19 1100  ceFEPIme  (MAXIPIME) 2 g in sodium chloride 0.9 % 100 mL IVPB  Status:  Discontinued     2 g 200 mL/hr over 30 Minutes Intravenous Every 12 hours 04/06/19 1055 04/07/19 1040   04/06/19 1100  metroNIDAZOLE (FLAGYL) IVPB 500 mg  Status:  Discontinued     500 mg 100 mL/hr over 60 Minutes Intravenous Every 8 hours 04/06/19 1056 04/06/19 1102   04/05/19 1200  vancomycin (VANCOCIN) 1,250 mg in sodium chloride 0.9 % 250 mL IVPB     1,250 mg 166.7 mL/hr over 90 Minutes Intravenous Every 12 hours 04/05/19 0517     04/03/19 0000  vancomycin (VANCOCIN) IVPB 1000 mg/200 mL premix  Status:  Discontinued     1,000 mg 200 mL/hr over 60 Minutes Intravenous Every 12 hours 04/02/19 1405 04/05/19 0517   04/02/19 1430  vancomycin (VANCOCIN) 1,500 mg in sodium chloride 0.9 % 500 mL IVPB  Status:  Discontinued     1,500 mg 250 mL/hr over 120 Minutes Intravenous  Once 04/02/19 1353 04/02/19 1356   04/02/19 1400  vancomycin (VANCOCIN) 1,500 mg in sodium chloride 0.9 % 500 mL IVPB     1,500 mg 250 mL/hr over 120 Minutes Intravenous  Once 04/02/19 1356 04/02/19 1652   04/01/19 1500  Ampicillin-Sulbactam (UNASYN) 3 g in sodium chloride 0.9 % 100 mL IVPB  Status:  Discontinued     3 g 200 mL/hr over 30 Minutes Intravenous  Every 6 hours 04/01/19 1107 04/06/19 1055   04/01/19 1000  ceFEPIme (MAXIPIME) 2 g in sodium chloride 0.9 % 100 mL IVPB  Status:  Discontinued     2 g 200 mL/hr over 30 Minutes Intravenous Every 12 hours 03/31/19 2340 04/01/19 0106   04/01/19 0130  Ampicillin-Sulbactam (UNASYN) 3 g in sodium chloride 0.9 % 100 mL IVPB  Status:  Discontinued     3 g 200 mL/hr over 30 Minutes Intravenous Every 8 hours 04/01/19 0120 04/01/19 1107   04/01/19 0000  vancomycin (VANCOCIN) 2,000 mg in sodium chloride 0.9 % 500 mL IVPB  Status:  Discontinued     2,000 mg 250 mL/hr over 120 Minutes Intravenous  Once 03/31/19 2355 04/01/19 0106   03/31/19 2345  vancomycin (VANCOCIN) IVPB 1000 mg/200 mL premix  Status:  Discontinued      1,000 mg 200 mL/hr over 60 Minutes Intravenous  Once 03/31/19 2340 03/31/19 2355   03/31/19 2045  cefTRIAXone (ROCEPHIN) 1 g in sodium chloride 0.9 % 100 mL IVPB     1 g 200 mL/hr over 30 Minutes Intravenous  Once 03/31/19 2039 03/31/19 2143      Microbiology: Results for orders placed or performed during the hospital encounter of 03/31/19  SARS Coronavirus 2 (CEPHEID - Performed in [facility] hospital lab), Hosp Order     Status: None   Collection Time: 03/31/19  8:43 PM   Specimen: Nasopharyngeal Swab  Result Value Ref Range Status   SARS Coronavirus 2 NEGATIVE NEGATIVE Final    Comment: (NOTE) If result is NEGATIVE SARS-CoV-2 target nucleic acids are NOT DETECTED. The SARS-CoV-2 RNA is generally detectable in upper and lower  respiratory specimens during the acute phase of infection. The lowest  concentration of SARS-CoV-2 viral copies this assay can detect is 250  copies / mL. A negative result does not preclude SARS-CoV-2 infection  and should not be used as the sole basis for treatment or other  patient management decisions.  A negative result may occur with  improper specimen collection / handling, submission of specimen other  than nasopharyngeal swab, presence of viral mutation(s) within the  areas targeted by this assay, and inadequate number of viral copies  (<250 copies / mL). A negative result must be combined with clinical  observations, patient history, and epidemiological information. If result is POSITIVE SARS-CoV-2 target nucleic acids are DETECTED. The SARS-CoV-2 RNA is generally detectable in upper and lower  respiratory specimens dur ing the acute phase of infection.  Positive  results are indicative of active infection with SARS-CoV-2.  Clinical  correlation with patient history and other diagnostic information is  necessary to determine patient infection status.  Positive results do  not rule out bacterial infection or co-infection with other  viruses. If result is PRESUMPTIVE POSTIVE SARS-CoV-2 nucleic acids MAY BE PRESENT.   A presumptive positive result was obtained on the submitted specimen  and confirmed on repeat testing.  While 2019 novel coronavirus  (SARS-CoV-2) nucleic acids may be present in the submitted sample  additional confirmatory testing may be necessary for epidemiological  and / or clinical management purposes  to differentiate between  SARS-CoV-2 and other Sarbecovirus currently known to infect humans.  If clinically indicated additional testing with an alternate test  methodology [phone]) is advised. The SARS-CoV-2 RNA is generally  detectable in upper and lower respiratory sp ecimens during the acute  phase of infection. The expected result is Negative. Fact Sheet for Patients:  StrictlyIdeas.no Fact Sheet for  Healthcare Providers: BankingDealers.co.za This test is not yet approved or cleared by the [place] and has been authorized for detection and/or diagnosis of SARS-CoV-2 by FDA under an Emergency Use Authorization (EUA).  This EUA will remain in effect (meaning this test can be used) for the duration of the COVID-19 declaration under Section 564(b)(1) of the Act, 21 U.S.C. section 360bbb-3(b)(1), unless the authorization is terminated or revoked sooner. Performed at [facility], [address] [place], [place] [address]   Culture, blood (routine x 2)     Status: Abnormal   Collection Time: 03/31/19  8:43 PM   Specimen: BLOOD  Result Value Ref Range Status   Specimen Description   Final    BLOOD LEFT ANTECUBITAL Performed at [facility], [address] [place], [place] [address]    Special Requests   Final    BOTTLES DRAWN AEROBIC AND ANAEROBIC Blood Culture adequate volume Performed at [facility], [address], [place], [place] [address]    Culture  Setup Time   Final    GRAM POSITIVE  COCCI AEROBIC BOTTLE ONLY CRITICAL VALUE NOTED.  VALUE IS CONSISTENT WITH PREVIOUSLY REPORTED AND CALLED VALUE. Thermal Performed at [facility], [place] [address] [place], [place] [address]    Culture STAPHYLOCOCCUS EPIDERMIDIS (A)  Final   Report Status 04/04/2019 FINAL  Final   Organism ID, Bacteria STAPHYLOCOCCUS EPIDERMIDIS  Final      Susceptibility   Staphylococcus epidermidis - MIC*    CIPROFLOXACIN <=0.5 SENSITIVE Sensitive     ERYTHROMYCIN >=8 RESISTANT Resistant     GENTAMICIN <=0.5 SENSITIVE Sensitive     OXACILLIN >=4 RESISTANT Resistant     TETRACYCLINE <=1 SENSITIVE Sensitive     VANCOMYCIN 2 SENSITIVE Sensitive     TRIMETH/SULFA <=10 SENSITIVE Sensitive     CLINDAMYCIN <=0.25 SENSITIVE Sensitive     RIFAMPIN <=0.5 SENSITIVE Sensitive     Inducible Clindamycin NEGATIVE Sensitive     * STAPHYLOCOCCUS EPIDERMIDIS  Culture, blood (routine x 2)     Status: Abnormal   Collection Time: 03/31/19  8:43 PM   Specimen: BLOOD  Result Value Ref Range Status   Specimen Description   Final    BLOOD RIGHT ANTECUBITAL Performed at [facility], [address] [place], [place] [address]    Special Requests   Final    BOTTLES DRAWN AEROBIC AND ANAEROBIC Blood Culture adequate volume Performed at [facility], [address] [place], [place] [address]    Culture  Setup Time   Final    GRAM POSITIVE COCCI IN BOTH AEROBIC AND ANAEROBIC BOTTLES CRITICAL RESULT CALLED TO, READ BACK BY AND VERIFIED WITH: MYRA SLAUGHTER ON 04/01/2019 AT 1344 QSD GRAM STAIN REVIEWED-AGREE WITH RESULT T. TYSOR    Culture (A)  Final    STAPHYLOCOCCUS EPIDERMIDIS SUSCEPTIBILITIES PERFORMED ON PREVIOUS CULTURE WITHIN THE LAST 5 DAYS. Performed at [facility], [place] [address] [name], [place] [address]    Report Status 04/04/2019 FINAL  Final  Blood Culture ID Panel (Reflexed)     Status: Abnormal   Collection Time: 03/31/19  8:43  PM  Result Value Ref Range Status   Enterococcus species NOT DETECTED NOT DETECTED Final   Listeria monocytogenes NOT DETECTED NOT DETECTED Final   Staphylococcus species DETECTED (A) NOT DETECTED Final    Comment: Methicillin (oxacillin) resistant coagulase negative staphylococcus. Possible blood culture contaminant (unless isolated from more than one  blood culture draw or clinical case suggests pathogenicity). No antibiotic treatment is indicated for blood  culture contaminants. CRITICAL RESULT CALLED TO, READ BACK BY AND VERIFIED WITH: MYRA SLAUGHTER ON 04/01/2019 AT 1344 QSD    Staphylococcus aureus (BCID) NOT DETECTED NOT DETECTED Final   Methicillin resistance DETECTED (A) NOT DETECTED Final    Comment: CRITICAL RESULT CALLED TO, READ BACK BY AND VERIFIED WITH: MYRA SLAUGHTER ON 04/01/2019 AT 1344 QSD    Streptococcus species NOT DETECTED NOT DETECTED Final   Streptococcus agalactiae NOT DETECTED NOT DETECTED Final   Streptococcus pneumoniae NOT DETECTED NOT DETECTED Final   Streptococcus pyogenes NOT DETECTED NOT DETECTED Final   Acinetobacter baumannii NOT DETECTED NOT DETECTED Final   Enterobacteriaceae species NOT DETECTED NOT DETECTED Final   Enterobacter cloacae complex NOT DETECTED NOT DETECTED Final   Escherichia coli NOT DETECTED NOT DETECTED Final   Klebsiella oxytoca NOT DETECTED NOT DETECTED Final   Klebsiella pneumoniae NOT DETECTED NOT DETECTED Final   Proteus species NOT DETECTED NOT DETECTED Final   Serratia marcescens NOT DETECTED NOT DETECTED Final   Haemophilus influenzae NOT DETECTED NOT DETECTED Final   Neisseria meningitidis NOT DETECTED NOT DETECTED Final   Pseudomonas aeruginosa NOT DETECTED NOT DETECTED Final   Candida albicans NOT DETECTED NOT DETECTED Final   Candida glabrata NOT DETECTED NOT DETECTED Final   Candida krusei NOT DETECTED NOT DETECTED Final   Candida parapsilosis NOT DETECTED NOT DETECTED Final   Candida tropicalis NOT DETECTED NOT  DETECTED Final    Comment: Performed at [facility], [address], [place], [place] [address]  Urine culture     Status: Abnormal   Collection Time: 03/31/19 10:13 PM   Specimen: Urine, Random  Result Value Ref Range Status   Specimen Description   Final    URINE, RANDOM Performed at [facility], [address], [place], [place] [address]    Special Requests   Final    NONE Performed at [facility], [address] [place], [place] [address]    Culture MULTIPLE SPECIES PRESENT, SUGGEST RECOLLECTION (A)  Final   Report Status 04/02/2019 FINAL  Final  MRSA PCR Screening     Status: None   Collection Time: 04/01/19  1:54 AM   Specimen: Nasopharyngeal  Result Value Ref Range Status   MRSA by PCR NEGATIVE NEGATIVE Final    Comment:        The GeneXpert MRSA Assay (FDA approved for NASAL specimens only), is one component of a comprehensive MRSA colonization surveillance program. It is not intended to diagnose MRSA infection nor to guide or monitor treatment for MRSA infections. Performed at [facility], [address] [place], [place] [address]   CULTURE, BLOOD (ROUTINE X 2) w Reflex to ID Panel     Status: None   Collection Time: 04/02/19  3:05 PM   Specimen: BLOOD RIGHT WRIST  Result Value Ref Range Status   Specimen Description BLOOD RIGHT WRIST  Final   Special Requests NONE  Final   Culture   Final    NO GROWTH 5 DAYS Performed at [facility], [address] [place], [place] [address]    Report Status 04/07/2019 FINAL  Final  Culture, blood (Routine X 2) w Reflex to ID Panel     Status: None   Collection Time: 04/02/19 10:45 PM   Specimen: BLOOD  Result Value Ref Range Status   Specimen Description BLOOD RIGHT ASSIST CONTROL  Final  Special Requests   Final    BOTTLES DRAWN AEROBIC AND ANAEROBIC Blood Culture adequate volume   Culture   Final    NO GROWTH 5 DAYS Performed at [facility], [address] [place], [place] [address]    Report Status 04/07/2019 FINAL  Final  CULTURE, BLOOD (ROUTINE X 2) w Reflex to ID Panel     Status: None (Preliminary result)   Collection Time: 04/05/19  1:33 PM   Specimen: BLOOD RIGHT ARM  Result Value Ref Range Status   Specimen Description BLOOD RIGHT ARM  Final   Special Requests   Final    BOTTLES DRAWN AEROBIC AND ANAEROBIC Blood Culture results may not be optimal due to an excessive volume of blood received in culture bottles   Culture   Final    NO GROWTH 2 DAYS Performed at [facility], [address], [place], [place] [address]    Report Status PENDING  Incomplete  CULTURE, BLOOD (ROUTINE X 2) w Reflex to ID Panel     Status: None (Preliminary result)   Collection Time: 04/05/19  1:42 PM   Specimen: BLOOD LEFT ARM  Result Value Ref Range Status   Specimen Description BLOOD LEFT ARM  Final   Special Requests   Final    BOTTLES DRAWN AEROBIC AND ANAEROBIC Blood Culture results may not be optimal due to an excessive volume of blood received in culture bottles   Culture   Final    NO GROWTH 2 DAYS Performed at [facility], [address], [place], [place] [address]    Report Status PENDING  Incomplete  MRSA PCR Screening     Status: None   Collection Time: 04/07/19  9:08 AM   Specimen: Nasopharyngeal  Result Value Ref Range Status   MRSA by PCR NEGATIVE NEGATIVE Final    Comment:        The GeneXpert MRSA Assay (FDA approved for NASAL specimens only), is one component of a comprehensive MRSA colonization surveillance program. It is not intended to diagnose MRSA infection nor to guide or monitor treatment for MRSA infections. Performed at [facility], [address] [place], [place] [address]     Best Practice/Protocols:  VTE Prophylaxis: Mechanical GI Prophylaxis: Proton Pump Inhibitor CIWA  Events:   Studies: Dg Chest 1 View  Result Date: 04/04/2019 CLINICAL DATA:  Fever.  Admitted for gastrointestinal bleeding. History of lung cancer and hypertension. EXAM: CHEST  1 VIEW COMPARISON:  Radiographs 04/02/2019, 04/01/2019 and 03/31/2019. FINDINGS: 1358 hours. The enteric tube has been removed. The heart size and mediastinal contours are stable. There are persistent low lung volumes with bibasilar infiltrates. Overall aeration of the right lung base has mildly improved. There is no pneumothorax or significant pleural effusion. The bones appear unchanged. IMPRESSION: Persistent bibasilar infiltrates suspicious for pneumonia, possibly on the basis of aspiration. Overall right basilar aeration has mildly improved. Electronically Signed   By: Richardean Sale M.D.   On: 04/04/2019 15:56   Dg Abd 1 View  Result Date: 04/01/2019 CLINICAL DATA:  NG tube placement. EXAM: ABDOMEN - 1 VIEW COMPARISON:  None. FINDINGS: Tip and side port of the enteric tube below the diaphragm in the stomach. High-density material in the colon suggestive of enteric contrast. No bowel dilatation in the upper abdomen. IMPRESSION: Tip and side port of the enteric tube below the diaphragm in the stomach. Electronically Signed   By: Keith Rake M.D.   On: 04/01/2019 03:53  Ct Head Wo Contrast  Result Date: 03/31/2019 CLINICAL DATA:  Head trauma EXAM: CT HEAD WITHOUT CONTRAST TECHNIQUE: Contiguous axial images were obtained from the base of the skull through the vertex without intravenous contrast. COMPARISON:  None. FINDINGS: Brain: No acute territorial infarction, hemorrhage, or intracranial mass. Atrophy and mild small vessel ischemic changes of the white matter. Slight ventricular prominence felt secondary to atrophy. Vascular: No hyperdense vessels. Scattered calcifications at the carotid siphon Skull: Normal. Negative for fracture or focal lesion. Sinuses/Orbits: No acute finding. Mucosal thickening in the right maxillary sinus. Other: None IMPRESSION: 1. No CT evidence for acute intracranial  abnormality. 2. Atrophy and mild small vessel ischemic changes of the white matter Electronically Signed   By: Donavan Foil M.D.   On: 03/31/2019 20:23   Dg Chest Port 1 View  Result Date: 04/07/2019 CLINICAL DATA:  Hypoxia.  Reported history of lung carcinoma EXAM: PORTABLE CHEST 1 VIEW COMPARISON:  April 05, 2019 FINDINGS: There is widespread airspace consolidation throughout the right lung. There is patchy infiltrate in the left base which is stable. There is new patchy infiltrate in the left upper lobe. Heart is mildly enlarged with pulmonary vascularity normal. No adenopathy. There is aortic atherosclerosis. No bone lesions. IMPRESSION: Widespread airspace opacity consistent with multifocal pneumonia, essentially stable the right and in the left base. New patchy infiltrate left upper lobe. Stable cardiomegaly.  Aortic Atherosclerosis (ICD10-I70.0). Electronically Signed   By: Lowella Grip III M.D.   On: 04/07/2019 11:34   Dg Chest Port 1 View  Result Date: 04/05/2019 CLINICAL DATA:  Fever.  History of lung cancer. EXAM: PORTABLE CHEST 1 VIEW COMPARISON:  Chest x-ray from yesterday. FINDINGS: Stable cardiomediastinal silhouette. Persistent low lung volumes with bilateral infiltrates, increasing in the right upper lobe. No pleural effusion or pneumothorax. No acute osseous abnormality. IMPRESSION: 1. Bilateral infiltrates, worsened in the right upper lobe. Electronically Signed   By: Titus Dubin M.D.   On: 04/05/2019 15:28   Dg Chest Port 1 View  Result Date: 04/02/2019 CLINICAL DATA:  Respiratory failure.  History of lung cancer. EXAM: PORTABLE CHEST 1 VIEW COMPARISON:  04/01/2019.  03/31/2019. FINDINGS: NG tube in stable position. Stable cardiomegaly. Persistent progressed bilateral pulmonary infiltrates/edema again noted. Interim bibasilar atelectasis. No pleural effusion or pneumothorax. IMPRESSION: 1.  NG tube stable position. 2.  Stable cardiomegaly 3. Persistent progressive bilateral  pulmonary infiltrates/edema noted. Bibasilar atelectasis. Electronically Signed   By: Marcello Moores  Register   On: 04/02/2019 07:23   Portable Chest 1 View  Result Date: 04/01/2019 CLINICAL DATA:  Dyspnea. EXAM: PORTABLE CHEST 1 VIEW COMPARISON:  Radiographs yesterday. FINDINGS: Enteric tube in place with tip and side-port below the diaphragm. Slight progression in heterogeneous bilateral bibasilar opacities from prior exam. Borderline cardiomegaly is again seen. Low lung volumes. No pleural effusion or pneumothorax. IMPRESSION: 1. Slight progression in bibasilar heterogeneous opacities from prior exam, may be aspiration or pneumonia. 2. Lower lung volumes from prior. Electronically Signed   By: Keith Rake M.D.   On: 04/01/2019 03:55   Dg Chest Port 1 View  Result Date: 03/31/2019 CLINICAL DATA:  Weakness EXAM: PORTABLE CHEST 1 VIEW COMPARISON:  CT 12/09/2017 FINDINGS: Mild reticular opacities at the bases. No consolidation or effusion. Borderline heart size. No pneumothorax. IMPRESSION: Mild reticular opacities in the right greater than left lung base, could reflect atypical infection. Borderline cardiomegaly. Electronically Signed   By: Donavan Foil M.D.   On: 03/31/2019 20:40   Dg Knee Complete 4 Views Left  Result Date: 03/31/2019 CLINICAL DATA:  Status post fall. EXAM: LEFT KNEE - COMPLETE 4+ VIEW COMPARISON:  None. FINDINGS: No evidence of fracture, or dislocation. Small suprapatellar joint effusion. Post total left knee arthroplasty with normal alignment of the orthopedic components and no evidence of loosening. Mild suprapatellar soft tissue swelling. IMPRESSION: 1. No acute fracture or dislocation identified about the left knee. 2. Small suprapatellar joint effusion. Electronically Signed   By: Fidela Salisbury M.D.   On: 03/31/2019 20:40    Consults: Treatment Team:  Tsosie Billing, MD Tyler Pita, MD   Subjective:    Overnight Issues: Hypoxemia with increased  respiratory distress, suspect recurrent aspiration.  Patient encephalopathic.  Also sedated with standing dose benzodiazepine.  Objective:  Vital signs for last 24 hours: Temp:  [99.1 F (37.3 C)-99.9 F (37.7 C)] 99.1 F (37.3 C) (06/27 1400) Pulse Rate:  [88-106] 88 (06/27 1900) Resp:  [17-36] 17 (06/27 1900) BP: (95-136)/(67-96) 98/73 (06/27 1900) SpO2:  [90 %-99 %] 99 % (06/27 1900) FiO2 (%):  [40 %] 40 % (06/27 1400) Weight:  [82.3 kg] 82.3 kg (06/27 0906)  Hemodynamic parameters for last 24 hours:    Intake/Output from previous day: 06/26 0701 - 06/27 0700 In: -  Out: 900 [Urine:900]  Intake/Output this shift: Total I/O In: -  Out: 450 [Urine:450]  Vent settings for last 24 hours: FiO2 (%):  [40 %] 40 %  Physical Exam:  Gen: Obtunded, on BiPAP. HEENT: NCAT, Mild scleral icterus Neck: No JVD noted, scar from prior surgery on the right Lungs: breath sounds full, scattered rhonchi worse on the right, no wheezes Cardiovascular: RRR, no murmurs Abdomen: Soft, nontender, normal BS Ext: Excoriations/abrasions over both knees, various stages of healing.  2+ pitting edema to midshin Neuro: Encephalopathic, will not follow commands, moves all 4 spontaneously Skin: Limited exam, creations and abrasions as noted above  Chest x-ray reviewed independently as below:    Assessment/Plan:   1.  Acute hypoxemic respiratory failure, BiPAP dependent: Triggered by aspiration in the setting of persistent encephalopathy.  Continue BiPAP, wean off as tolerated.  Arterial blood gas does not show CO2 retention.  Keep n.p.o.  Discontinue cefepime and Flagyl in the setting of worsening encephalopathy.  Switched to Rocephin 2 g IV daily, monitor.  Recheck chest x-ray in the morning.  Patient also has evidence of mild volume overload (elevated BNP and edema) and will treat with Lasix 20 mg IV x1.  2.  Persistent encephalopathy in the setting of alcohol withdrawal: Consider Warnicke Korsakoff  syndrome versus alcoholic pellagra.  High-dose thiamine, supplement niacin when taking p.o. reliably.  Avoid standing dose sedative medications and only give benzodiazepines on an as needed basis according to CIWA scale, as needed Haldol for agitation.  Consider MRI of the brain if mental status fails to clear.  3.  Dysphagia: Aggravated by the above, n.p.o. for now, consider feeding tube placement postpyloric, per IR as patient has not had reliable nutrition since admission.  4.  Coag negative staph bacteremia: (3 out of 4 blood cultures positive) on vancomycin, ID following.  5.  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  Ammonia level normal.  6.  Social issues: Patient has no family and currently no support from friends.  Get social work consult.  Patient may require skilled nursing facility placement.     LOS: 7 days   Additional comments:None    Critical Care Total Time*: 35 minutes  C. Derrill Kay, MD [place] [facility] 04/07/2019  *Care during  the described time interval was provided by me and/or other providers on the critical care team.  I have reviewed this patient's available data, including medical history, events of note, physical examination and test results as part of my evaluation.

## 2019-04-08 ENCOUNTER — Inpatient Hospital Stay: Payer: Self-pay

## 2019-04-08 DIAGNOSIS — G9341 Metabolic encephalopathy: Secondary | ICD-10-CM

## 2019-04-08 LAB — BASIC METABOLIC PANEL
Anion gap: 8 (ref 5–15)
BUN: 10 mg/dL (ref 8–23)
CO2: 24 mmol/L (ref 22–32)
Calcium: 7 mg/dL — ABNORMAL LOW (ref 8.9–10.3)
Chloride: 108 mmol/L (ref 98–111)
Creatinine, Ser: 0.43 mg/dL — ABNORMAL LOW (ref 0.61–1.24)
GFR calc Af Amer: >60 mL/min (ref >60)
GFR calc non Af Amer: >60 mL/min (ref >60)
Glucose, Bld: 94 mg/dL (ref 70–99)
Potassium: 3.6 mmol/L (ref 3.5–5.1)
Sodium: 140 mmol/L (ref 135–145)

## 2019-04-08 LAB — PHOSPHORUS: Phosphorus: 1.6 mg/dL — ABNORMAL LOW (ref 2.5–4.6)

## 2019-04-08 LAB — MAGNESIUM: Magnesium: 2.3 mg/dL (ref 1.7–2.4)

## 2019-04-08 LAB — VANCOMYCIN, PEAK: Vancomycin Pk: 13 ug/mL — ABNORMAL LOW (ref 30–40)

## 2019-04-08 MED ORDER — GUAIFENESIN-DM 100-10 MG/5ML PO SYRP
5.0000 mL | ORAL_SOLUTION | ORAL | Status: DC | PRN
  Administered 2019-04-08 – 2019-04-24 (×16): 5 mL via ORAL
  Filled 2019-04-08 (×16): qty 5

## 2019-04-08 MED ORDER — FUROSEMIDE 10 MG/ML IJ SOLN
20.0000 mg | Freq: Once | INTRAMUSCULAR | Status: AC
  Administered 2019-04-08: 20 mg via INTRAVENOUS
  Filled 2019-04-08: qty 2

## 2019-04-08 MED ORDER — POTASSIUM PHOSPHATES 15 MMOLE/5ML IV SOLN
25.0000 mmol | Freq: Once | INTRAVENOUS | Status: AC
  Administered 2019-04-08: 25 mmol via INTRAVENOUS
  Filled 2019-04-08: qty 8.33

## 2019-04-08 MED ORDER — POTASSIUM PHOSPHATES 15 MMOLE/5ML IV SOLN
20.0000 mmol | Freq: Once | INTRAVENOUS | Status: DC
  Filled 2019-04-08: qty 6.67

## 2019-04-08 MED ORDER — VANCOMYCIN HCL 1.5 G IV SOLR
1500.0000 mg | Freq: Two times a day (BID) | INTRAVENOUS | Status: DC
  Administered 2019-04-08 – 2019-04-12 (×10): 1500 mg via INTRAVENOUS
  Filled 2019-04-08 (×14): qty 1500

## 2019-04-08 NOTE — Consult Note (Signed)
PHARMACY CONSULT NOTE - FOLLOW UP  Pharmacy Consult for Electrolyte Monitoring and Replacement   Recent Labs: Potassium (mmol/L)  Date Value  04/08/2019 3.6   Magnesium (mg/dL)  Date Value  04/08/2019 2.3   Calcium (mg/dL)  Date Value  04/08/2019 7.0 (L)   Albumin (g/dL)  Date Value  04/07/2019 2.0 (L)   Phosphorus (mg/dL)  Date Value  04/08/2019 1.6 (L)   Sodium (mmol/L)  Date Value  04/08/2019 140   Corrected Calcium 8.6  Assessment: RQ is 62 YOM admitted for pneumonia and GI bleed on 03/31/19. Past medical history for alcohol abuse and patient is currently under CIWA protocol.  Goal of Therapy:  Potassium ~4 Magnesium ~ 2 Phosphorus ~2.5  Plan: Patient not taking oral meds at this time.   Will order KPhos 66mmol IV x 1 dose.   Will recheck electrolytes with AM labs.   Pharmacy will continue to monitor and adjust accordingly.  Pernell Dupre, PharmD, BCPS Clinical Pharmacist 04/08/2019 4:52 AM

## 2019-04-08 NOTE — Progress Notes (Signed)
[name], male   DOB: 1956-07-17, 63 y.o.   MRN: [id number]  [facility] PROGRESS NOTE  George Wade YPP:[id number] DOB: 12-May-1956 DOA: 03/31/2019 PCP: Patient, [name] Per  HPI/Subjective: Patient is off BiPAP feeling much better answering questions appropriately awake and alert Objective: Vitals:   04/08/19 0759 04/08/19 0800  BP:  106/65  Pulse:  85  Resp:  20  Temp:  99.8 F (37.7 C)  SpO2: 90% 92%    Filed Weights   03/31/19 1912 04/07/19 0906 04/08/19 0435  Weight: 78 kg 82.3 kg 83.8 kg    ROS: Review of Systems  Constitutional: Negative for chills and fever.  HENT: Negative for nosebleeds and tinnitus.   Eyes: Negative for blurred vision.  Respiratory: Positive for cough. Negative for shortness of breath.   Cardiovascular: Negative for palpitations.  Gastrointestinal: Negative for abdominal pain.  Genitourinary: Negative for dysuria and frequency.  Skin: Negative for itching.  Neurological: Negative for dizziness, tremors and weakness.  Psychiatric/Behavioral: Negative for suicidal ideas. The patient is not nervous/anxious.    Exam: Physical Exam  HENT:  Nose: No mucosal edema.  Mouth/Throat: No oropharyngeal exudate or posterior oropharyngeal edema.  Eyes: Pupils are equal, round, and reactive to light. Conjunctivae, EOM and lids are normal.  Neck: No JVD present. Carotid bruit is not present. No edema present. No thyroid mass and no thyromegaly present.  Cardiovascular: S1 normal and S2 normal. Exam reveals no gallop.  No murmur heard. Pulses:      Dorsalis pedis pulses are 2+ on the right side and 2+ on the left side.  Respiratory: No respiratory distress. He has decreased breath sounds in the right lower field and the left lower field. He has no wheezes. He has rhonchi in the right lower field and the left lower field. He has no rales.  Positive crackles bilaterally, diminished breath sounds  GI: Soft. Bowel sounds are normal. There  is no abdominal tenderness.  Musculoskeletal:     Right ankle: He exhibits swelling.     Left ankle: He exhibits swelling.  Lymphadenopathy:    He has no cervical adenopathy.  Neurological: He is alert.  Skin: Skin is warm. Nails show no clubbing.  Skin abrasions bilateral knees.  Psychiatric: His affect is blunt.      Data Reviewed: Basic Metabolic Panel: Recent Labs  Lab 04/04/19 0307 04/04/19 1754 04/05/19 0424 04/06/19 0742 04/07/19 0458 04/08/19 0315  NA 132*  --  137 139 139 140  K 2.9* 3.4* 3.7 3.4* 3.7 3.6  CL 99  --  104 105 110 108  CO2 27  --  24 24 21* 24  GLUCOSE 124*  --  110* 103* 97 94  BUN 7*  --  <5* 7* 9 10  CREATININE 0.47*  --  0.39* 0.42* 0.47* 0.43*  CALCIUM 8.2*  --  7.9* 7.5* 6.9* 7.0*  MG  --  1.9 1.9  --  2.0 2.3  PHOS  --   --  1.3* 1.5* 2.2* 1.6*   Liver Function Tests: Recent Labs  Lab 04/02/19 0556 04/04/19 0307 04/07/19 0458  AST 207* 201* 80*  ALT 98* 125* 76*  ALKPHOS 166* 201* 213*  BILITOT 2.2* 4.1* 3.6*  PROT 5.0* 5.1* 5.0*  ALBUMIN 2.3* 2.3* 2.0*   CBC: Recent Labs  Lab 04/03/19 0422 04/04/19 0307 04/05/19 0424 04/07/19 0458 04/07/19 1720  WBC 9.5 11.5* 16.5* 12.7* 11.7*  HGB 8.0* 7.9* 8.4* 7.1* 7.8*  HCT 29.4* 27.9* 29.9*  25.1* 29.1*  MCV 83.3 79.0* 80.8 80.4 83.6  PLT 148* 152 206 282 277   Cardiac Enzymes: Recent Labs  Lab 04/02/19 0556  CKTOTAL 347    CBG: Recent Labs  Lab 04/02/19 0604 04/02/19 1139 04/03/19 0009 04/03/19 0603 04/07/19 0901  GLUCAP 121* 118* 107* 104* 87    Recent Results (from the past 240 hour(s))  SARS Coronavirus 2 (CEPHEID - Performed in [place] hospital lab), Hosp Order     Status: None   Collection Time: 03/31/19  8:43 PM   Specimen: Nasopharyngeal Swab  Result Value Ref Range Status   SARS Coronavirus 2 NEGATIVE NEGATIVE Final    Comment: (NOTE) If result is NEGATIVE SARS-CoV-2 target nucleic acids are NOT DETECTED. The SARS-CoV-2 RNA is generally detectable  in upper and lower  respiratory specimens during the acute phase of infection. The lowest  concentration of SARS-CoV-2 viral copies this assay can detect is 250  copies / mL. A negative result does not preclude SARS-CoV-2 infection  and should not be used as the sole basis for treatment or other  patient management decisions.  A negative result may occur with  improper specimen collection / handling, submission of specimen other  than nasopharyngeal swab, presence of viral mutation(s) within the  areas targeted by this assay, and inadequate number of viral copies  (<250 copies / mL). A negative result must be combined with clinical  observations, patient history, and epidemiological information. If result is POSITIVE SARS-CoV-2 target nucleic acids are DETECTED. The SARS-CoV-2 RNA is generally detectable in upper and lower  respiratory specimens dur ing the acute phase of infection.  Positive  results are indicative of active infection with SARS-CoV-2.  Clinical  correlation with patient history and other diagnostic information is  necessary to determine patient infection status.  Positive results do  not rule out bacterial infection or co-infection with other viruses. If result is PRESUMPTIVE POSTIVE SARS-CoV-2 nucleic acids MAY BE PRESENT.   A presumptive positive result was obtained on the submitted specimen  and confirmed on repeat testing.  While 2019 novel coronavirus  (SARS-CoV-2) nucleic acids may be present in the submitted sample  additional confirmatory testing may be necessary for epidemiological  and / or clinical management purposes  to differentiate between  SARS-CoV-2 and other Sarbecovirus currently known to infect humans.  If clinically indicated additional testing with an alternate test  methodology [phone]) is advised. The SARS-CoV-2 RNA is generally  detectable in upper and lower respiratory sp ecimens during the acute  phase of infection. The expected result is  Negative. Fact Sheet for Patients:  StrictlyIdeas.no Fact Sheet for Healthcare Providers: BankingDealers.co.za This test is not yet approved or cleared by the [place] FDA and has been authorized for detection and/or diagnosis of SARS-CoV-2 by FDA under an Emergency Use Authorization (EUA).  This EUA will remain in effect (meaning this test can be used) for the duration of the COVID-19 declaration under Section 564(b)(1) of the Act, 21 U.S.C. section 360bbb-3(b)(1), unless the authorization is terminated or revoked sooner. Performed at [facility], [address] [place], [place] [address]   Culture, blood (routine x 2)     Status: Abnormal   Collection Time: 03/31/19  8:43 PM   Specimen: BLOOD  Result Value Ref Range Status   Specimen Description   Final    BLOOD LEFT ANTECUBITAL Performed at [facility], [address] [place], [place] [address]    Special Requests   Final  BOTTLES DRAWN AEROBIC AND ANAEROBIC Blood Culture adequate volume Performed at [facility], [address] [place], [place] [address]    Culture  Setup Time   Final    GRAM POSITIVE COCCI AEROBIC BOTTLE ONLY CRITICAL VALUE NOTED.  VALUE IS CONSISTENT WITH PREVIOUSLY REPORTED AND CALLED VALUE. [place] Performed at [facility], [place] [address] [place], [place] [address]    Culture STAPHYLOCOCCUS EPIDERMIDIS (A)  Final   Report Status 04/04/2019 FINAL  Final   Organism ID, Bacteria STAPHYLOCOCCUS EPIDERMIDIS  Final      Susceptibility   Staphylococcus epidermidis - MIC*    CIPROFLOXACIN <=0.5 SENSITIVE Sensitive     ERYTHROMYCIN >=8 RESISTANT Resistant     GENTAMICIN <=0.5 SENSITIVE Sensitive     OXACILLIN >=4 RESISTANT Resistant     TETRACYCLINE <=1 SENSITIVE Sensitive     VANCOMYCIN 2 SENSITIVE Sensitive     TRIMETH/SULFA <=10 SENSITIVE Sensitive     CLINDAMYCIN <=0.25  SENSITIVE Sensitive     RIFAMPIN <=0.5 SENSITIVE Sensitive     Inducible Clindamycin NEGATIVE Sensitive     * STAPHYLOCOCCUS EPIDERMIDIS  Culture, blood (routine x 2)     Status: Abnormal   Collection Time: 03/31/19  8:43 PM   Specimen: BLOOD  Result Value Ref Range Status   Specimen Description   Final    BLOOD RIGHT ANTECUBITAL Performed at [facility], [address] [place], [place] [address]    Special Requests   Final    BOTTLES DRAWN AEROBIC AND ANAEROBIC Blood Culture adequate volume Performed at [facility], [address] [place], [place] [address]    Culture  Setup Time   Final    GRAM POSITIVE COCCI IN BOTH AEROBIC AND ANAEROBIC BOTTLES CRITICAL RESULT CALLED TO, READ BACK BY AND VERIFIED WITH: MYRA SLAUGHTER ON 04/01/2019 AT 1344 QSD GRAM STAIN REVIEWED-AGREE WITH RESULT T. TYSOR    Culture (A)  Final    STAPHYLOCOCCUS EPIDERMIDIS SUSCEPTIBILITIES PERFORMED ON PREVIOUS CULTURE WITHIN THE LAST 5 DAYS. Performed at [facility], [place] [address] [place], [place] [address]    Report Status 04/04/2019 FINAL  Final  Blood Culture ID Panel (Reflexed)     Status: Abnormal   Collection Time: 03/31/19  8:43 PM  Result Value Ref Range Status   Enterococcus species NOT DETECTED NOT DETECTED Final   Listeria monocytogenes NOT DETECTED NOT DETECTED Final   Staphylococcus species DETECTED (A) NOT DETECTED Final    Comment: Methicillin (oxacillin) resistant coagulase negative staphylococcus. Possible blood culture contaminant (unless isolated from more than one blood culture draw or clinical case suggests pathogenicity). No antibiotic treatment is indicated for blood  culture contaminants. CRITICAL RESULT CALLED TO, READ BACK BY AND VERIFIED WITH: MYRA SLAUGHTER ON 04/01/2019 AT 1344 QSD    Staphylococcus aureus (BCID) NOT DETECTED NOT DETECTED Final   Methicillin resistance DETECTED (A) NOT DETECTED Final    Comment: CRITICAL RESULT CALLED TO, READ  BACK BY AND VERIFIED WITH: MYRA SLAUGHTER ON 04/01/2019 AT 1344 QSD    Streptococcus species NOT DETECTED NOT DETECTED Final   Streptococcus agalactiae NOT DETECTED NOT DETECTED Final   Streptococcus pneumoniae NOT DETECTED NOT DETECTED Final   Streptococcus pyogenes NOT DETECTED NOT DETECTED Final   Acinetobacter baumannii NOT DETECTED NOT DETECTED Final   Enterobacteriaceae species NOT DETECTED NOT DETECTED Final   Enterobacter cloacae complex NOT DETECTED NOT DETECTED Final   Escherichia coli NOT DETECTED NOT DETECTED Final  Klebsiella oxytoca NOT DETECTED NOT DETECTED Final   Klebsiella pneumoniae NOT DETECTED NOT DETECTED Final   Proteus species NOT DETECTED NOT DETECTED Final   Serratia marcescens NOT DETECTED NOT DETECTED Final   Haemophilus influenzae NOT DETECTED NOT DETECTED Final   Neisseria meningitidis NOT DETECTED NOT DETECTED Final   Pseudomonas aeruginosa NOT DETECTED NOT DETECTED Final   Candida albicans NOT DETECTED NOT DETECTED Final   Candida glabrata NOT DETECTED NOT DETECTED Final   Candida krusei NOT DETECTED NOT DETECTED Final   Candida parapsilosis NOT DETECTED NOT DETECTED Final   Candida tropicalis NOT DETECTED NOT DETECTED Final    Comment: Performed at [facility], [address], [place], [place] [address]  Urine culture     Status: Abnormal   Collection Time: 03/31/19 10:13 PM   Specimen: Urine, Random  Result Value Ref Range Status   Specimen Description   Final    URINE, RANDOM Performed at [facility], [address] [place], [place] [address]    Special Requests   Final    NONE Performed at [facility], [address], [place], [place] [address]    Culture MULTIPLE SPECIES PRESENT, SUGGEST RECOLLECTION (A)  Final   Report Status 04/02/2019 FINAL  Final  MRSA PCR Screening     Status: None   Collection Time: 04/01/19  1:54 AM   Specimen: Nasopharyngeal  Result Value Ref Range Status   MRSA by PCR NEGATIVE  NEGATIVE Final    Comment:        The GeneXpert MRSA Assay (FDA approved for NASAL specimens only), is one component of a comprehensive MRSA colonization surveillance program. It is not intended to diagnose MRSA infection nor to guide or monitor treatment for MRSA infections. Performed at [facility], [address] [place], [place] [address]   CULTURE, BLOOD (ROUTINE X 2) w Reflex to ID Panel     Status: None   Collection Time: 04/02/19  3:05 PM   Specimen: BLOOD RIGHT WRIST  Result Value Ref Range Status   Specimen Description BLOOD RIGHT WRIST  Final   Special Requests NONE  Final   Culture   Final    NO GROWTH 5 DAYS Performed at [facility], [address] [place], [place] [address]    Report Status 04/07/2019 FINAL  Final  Culture, blood (Routine X 2) w Reflex to ID Panel     Status: None   Collection Time: 04/02/19 10:45 PM   Specimen: BLOOD  Result Value Ref Range Status   Specimen Description BLOOD RIGHT ASSIST CONTROL  Final   Special Requests   Final    BOTTLES DRAWN AEROBIC AND ANAEROBIC Blood Culture adequate volume   Culture   Final    NO GROWTH 5 DAYS Performed at [facility], [address] [place], [place] [address]    Report Status 04/07/2019 FINAL  Final  CULTURE, BLOOD (ROUTINE X 2) w Reflex to ID Panel     Status: None (Preliminary result)   Collection Time: 04/05/19  1:33 PM   Specimen: BLOOD RIGHT ARM  Result Value Ref Range Status   Specimen Description BLOOD RIGHT ARM  Final   Special Requests   Final    BOTTLES DRAWN AEROBIC AND ANAEROBIC Blood Culture results may not be optimal due to an excessive volume of blood received in culture bottles   Culture   Final    NO GROWTH 3 DAYS Performed at [facility], [address] [place], [place] [address]    Report Status PENDING  Incomplete  CULTURE, BLOOD (ROUTINE X 2) w Reflex to ID Panel     Status: None (Preliminary result)   Collection Time:  04/05/19  1:42 PM   Specimen: BLOOD LEFT ARM  Result Value Ref Range Status   Specimen Description BLOOD LEFT ARM  Final   Special Requests   Final    BOTTLES DRAWN AEROBIC AND ANAEROBIC Blood Culture results may not be optimal due to an excessive volume of blood received in culture bottles   Culture   Final    NO GROWTH 3 DAYS Performed at [facility], [address] [place], [place] [address]    Report Status PENDING  Incomplete  MRSA PCR Screening     Status: None   Collection Time: 04/07/19  9:08 AM   Specimen: Nasopharyngeal  Result Value Ref Range Status   MRSA by PCR NEGATIVE NEGATIVE Final    Comment:        The GeneXpert MRSA Assay (FDA approved for NASAL specimens only), is one component of a comprehensive MRSA colonization surveillance program. It is not intended to diagnose MRSA infection nor to guide or monitor treatment for MRSA infections. Performed at [facility], [address], [place], [place] [address]      Studies: Dg Chest [facility] 1 View  Result Date: 04/08/2019 CLINICAL DATA:  Pneumonia EXAM: PORTABLE CHEST 1 VIEW COMPARISON:  Chest x-rays dated 04/07/2019 and 04/05/2019. FINDINGS: Continued airspace opacities throughout the RIGHT lung, without significant change. Patchy hazy opacities also persist within the LEFT perihilar and lower lung zones, unchanged. Probable RIGHT pleural effusion. No pneumothorax seen. Stable cardiomegaly. IMPRESSION: No significant interval change. Persistent multifocal/bilateral pneumonia. Electronically Signed   By: Franki Cabot M.D.   On: 04/08/2019 06:43   Dg Chest Port 1 View  Result Date: 04/07/2019 CLINICAL DATA:  Hypoxia.  Reported history of lung carcinoma EXAM: PORTABLE CHEST 1 VIEW COMPARISON:  April 05, 2019 FINDINGS: There is widespread airspace consolidation throughout the right lung. There is patchy infiltrate in the left base which is stable. There is new patchy infiltrate in the left upper lobe.  Heart is mildly enlarged with pulmonary vascularity normal. No adenopathy. There is aortic atherosclerosis. No bone lesions. IMPRESSION: Widespread airspace opacity consistent with multifocal pneumonia, essentially stable the right and in the left base. New patchy infiltrate left upper lobe. Stable cardiomegaly.  Aortic Atherosclerosis (ICD10-I70.0). Electronically Signed   By: Lowella Grip III M.D.   On: 04/07/2019 11:34    Scheduled Meds: . folic acid  1 mg Intravenous Daily  . ipratropium-albuterol  3 mL Nebulization Q6H  . mouth rinse  15 mL Mouth Rinse BID  . multivitamin with minerals  1 tablet Oral Daily  . niacin  500 mg Oral QHS  . pantoprazole (PROTONIX) IV  40 mg Intravenous Q12H  . sodium chloride flush  3 mL Intravenous Q12H  . vitamin C  500 mg Oral BID   Continuous Infusions: . sodium chloride Stopped (04/06/19 0201)  . cefTRIAXone (ROCEPHIN)  IV 2 g (04/07/19 1518)  . thiamine injection 500 mg (04/07/19 1937)  . vancomycin 1,500 mg (04/08/19 1214)    Assessment/Plan:  1. Acute respiratory failure with hypoxia from multifocal pneumonia-healthcare associated pneumonia.  Ankle improving.  Off BiPAP.  On high flow oxygen, wean off oxygen as tolerated on vancomycin and intensivist added Rocephin as patient was already on Unasyn, bronchodilator treatments as needed.  Discussed with  Dr. Patsey Berthold intensivist. 2. Staph epidermidis bacteremia on vancomycin.  Echocardiogram negative.  Spoke with cardiology and they do not want to do a TEE.   infectious disease specialist and recommending  2 to 4 weeks of IV antibiotics.  Continue to watch temperature curve. 3. Aspiration pneumonia.  Repeat chest x-ray showed worsening aspiration.  Infectious disease is following.  Appreciate their recommendations, will f/u with ID inam 4. Acute blood loss anemia with GI bleed.  Recent endoscopy with peptic ulcer disease and gastritis.  On Protonix. 5. Hypokalemia and hypophosphatemia.  Replete and  recheck 6. Acute rhabdomyolysis improved with IV fluids 7. Alcohol withdrawal with hallucinations on alcohol withdrawal protocol.  Nurse asking for some standing dose Ativan.  Ammonia level 18. 8. Hypertension on Norvasc  Plan discussed with intensivist  Code Status:     Code Status Orders  (From admission, onward)         Start     Ordered   03/31/19 2341  Full code  Continuous     03/31/19 2340        Code Status History    Date Active Date Inactive Code Status Order ID Comments User Context   12/09/2017 1336 12/12/2017 1845 Full Code [id number]  Dustin Flock, MD Inpatient   Advance Care Planning Activity     Family Communication: Spoke with the patient's friend Minda Ditto.  He endorsed that patient does not have any family members who is willing to take care of him and also he refused to be his healthcare power of attorney as it is overwhelming .  Will consult social worker Disposition Plan: No plan at this point in time  Consultants:  Infectious disease  Antibiotics:  Vancomycin  Critical care time spent: 32minutes  Illene Silver Shawnique Mariotti  [employer]

## 2019-04-08 NOTE — Progress Notes (Signed)
Pharmacy Antibiotic Note  George Wade is a 63 y.o. male admitted on 03/31/2019 with aspiration pneumonia and now with CoNS bacteremia.  Pharmacy has been consulted for  vancomycin dosing. Since admission his Scr is back to baseline and leukocytosis has resolved but he remains encephalopathic with 3/4 positive blood cultures. - likely source could be the skin abrasion on both knees since he has B/L TKA. ID will evaluate later to see whether he needs TEE - Cards deemed unsafe/  Plan:  6/28 Vanc Pk: 14. Level draw 2 1/2 hours after infusion. Level is subtherapeutic. Will increase dose to Vancomycin 1500 mg IV Q 12 h   Goal AUC 400-550.  Expected AUC: 530  SCr used: 0.70 mg/dL (rounded up)  Current plan is to continue patient on vancomycin. Will F/U on plan on Monday and order vancomycin levels with 5th dose of new regimen if needed.   Height: 5\' 9"  (175.3 cm) Weight: 184 lb 11.9 oz (83.8 kg) IBW/kg (Calculated) : 70.7  Temp (24hrs), Avg:98.8 F (37.1 C), Min:98.1 F (36.7 C), Max:99.2 F (37.3 C)  Recent Labs  Lab 04/02/19 0556 04/03/19 0422 04/03/19 1321 04/04/19 0307  04/05/19 0424 04/06/19 0742 04/07/19 0458 04/07/19 1720 04/08/19 0315  WBC 10.3 9.5  --  11.5*  --  16.5*  --  12.7* 11.7*  --   CREATININE 0.84 0.58*  --  0.47*  --  0.39* 0.42* 0.47*  --  0.43*  LATICACIDVEN 1.2  --   --   --   --   --   --   --   --   --   VANCOTROUGH  --   --  33*  --   --   --   --   --   --   --   VANCOPEAK  --   --   --   --    < > 14*  --   --   --  13*   < > = values in this interval not displayed.    Estimated Creatinine Clearance: 95.7 mL/min (A) (by C-G formula based on SCr of 0.43 mg/dL (L)).    Microbiology 6/20 BCx 3/4 S epidermidis 6/21 MRSA PCR (-) 6/20 UCx recollect 6/20 SARS CoV-2 (-)  Antibiotics this Admission Unasyn 6/21 >>6/26 Vancomycin 6/22 >> Ceftriaxone 6/27 >>   Thank you for allowing pharmacy to be a part of this patient's care.  Pernell Dupre,  PharmD, BCPS Clinical Pharmacist 04/08/2019 4:39 AM

## 2019-04-08 NOTE — Progress Notes (Signed)
PT Cancellation Note  Patient Details Name: George Wade MRN: [id number] DOB: [birth year]   Cancelled Treatment:    Reason Eval/Treat Not Completed: Medical issues which prohibited therapy(Per chart review, patient noted with transfer to CCU due to decline in respiratory status, requiring increased respiratory support.  Per guidelines, will require new orders to resume PT services after transfer to higher level of care. Please re-consult as medically appropriate.)   Emmory Solivan H. [place] Shark, PT, DPT, NCS 04/08/19, 12:13 PM [phone]

## 2019-04-08 NOTE — Progress Notes (Addendum)
Follow up - Critical Care Medicine Note  Patient Details:    George Wade is an 63 y.o. male admitted to [facility] on 31 March 2019 after being found down.  He has a history of heavy alcohol use.  He has been evaluated by PCCM at the beginning of his admission when he was in the ICU.  He was noted to have issues with encephalopathy and delirium.  He remains encephalopathic and delirious.  He has also been noted to have staph epi bacteremia and is being treated with vancomycin.  He has issues with dysphagia due to his persistent encephalopathy and has had recurrent aspiration.  Currently he is n.p.o.  Suspect that he is aspirating his own secretions.  Today he presented back to the stepdown unit due to increased respiratory distress and encephalopathy. He has had recent GI bleed due to erosive esophagitis.   Patient has no next of kin.  He has a friend as a contact who has actually severed all ties with the patient.  Lines, Airways, Drains: External Urinary Catheter (Active)  Collection Container Standard drainage bag 04/07/19 0906  Securement Method Other (Comment) 04/07/19 0906  Intervention Equipment Changed 04/06/19 0936  Output (mL) 450 mL 04/07/19 1942    Anti-infectives:  Anti-infectives (From admission, onward)   Start     Dose/Rate Route Frequency Ordered Stop   04/08/19 1100  vancomycin (VANCOCIN) 1,500 mg in sodium chloride 0.9 % 500 mL IVPB     1,500 mg 250 mL/hr over 120 Minutes Intravenous Every 12 hours 04/08/19 0433     04/07/19 1100  cefTRIAXone (ROCEPHIN) 2 g in sodium chloride 0.9 % 100 mL IVPB     2 g 200 mL/hr over 30 Minutes Intravenous Every 24 hours 04/07/19 1052     04/06/19 1800  metroNIDAZOLE (FLAGYL) IVPB 500 mg  Status:  Discontinued     500 mg 100 mL/hr over 60 Minutes Intravenous Every 8 hours 04/06/19 1102 04/07/19 1039   04/06/19 1100  ceFEPIme (MAXIPIME) 2 g in sodium chloride 0.9 % 100 mL IVPB  Status:  Discontinued     2 g 200 mL/hr over 30 Minutes  Intravenous Every 12 hours 04/06/19 1055 04/07/19 1040   04/06/19 1100  metroNIDAZOLE (FLAGYL) IVPB 500 mg  Status:  Discontinued     500 mg 100 mL/hr over 60 Minutes Intravenous Every 8 hours 04/06/19 1056 04/06/19 1102   04/05/19 1200  vancomycin (VANCOCIN) 1,250 mg in sodium chloride 0.9 % 250 mL IVPB  Status:  Discontinued     1,250 mg 166.7 mL/hr over 90 Minutes Intravenous Every 12 hours 04/05/19 0517 04/08/19 0433   04/03/19 0000  vancomycin (VANCOCIN) IVPB 1000 mg/200 mL premix  Status:  Discontinued     1,000 mg 200 mL/hr over 60 Minutes Intravenous Every 12 hours 04/02/19 1405 04/05/19 0517   04/02/19 1430  vancomycin (VANCOCIN) 1,500 mg in sodium chloride 0.9 % 500 mL IVPB  Status:  Discontinued     1,500 mg 250 mL/hr over 120 Minutes Intravenous  Once 04/02/19 1353 04/02/19 1356   04/02/19 1400  vancomycin (VANCOCIN) 1,500 mg in sodium chloride 0.9 % 500 mL IVPB     1,500 mg 250 mL/hr over 120 Minutes Intravenous  Once 04/02/19 1356 04/02/19 1652   04/01/19 1500  Ampicillin-Sulbactam (UNASYN) 3 g in sodium chloride 0.9 % 100 mL IVPB  Status:  Discontinued     3 g 200 mL/hr over 30 Minutes Intravenous Every 6 hours 04/01/19 1107 04/06/19 1055  04/01/19 1000  ceFEPIme (MAXIPIME) 2 g in sodium chloride 0.9 % 100 mL IVPB  Status:  Discontinued     2 g 200 mL/hr over 30 Minutes Intravenous Every 12 hours 03/31/19 2340 04/01/19 0106   04/01/19 0130  Ampicillin-Sulbactam (UNASYN) 3 g in sodium chloride 0.9 % 100 mL IVPB  Status:  Discontinued     3 g 200 mL/hr over 30 Minutes Intravenous Every 8 hours 04/01/19 0120 04/01/19 1107   04/01/19 0000  vancomycin (VANCOCIN) 2,000 mg in sodium chloride 0.9 % 500 mL IVPB  Status:  Discontinued     2,000 mg 250 mL/hr over 120 Minutes Intravenous  Once 03/31/19 2355 04/01/19 0106   03/31/19 2345  vancomycin (VANCOCIN) IVPB 1000 mg/200 mL premix  Status:  Discontinued     1,000 mg 200 mL/hr over 60 Minutes Intravenous  Once 03/31/19 2340  03/31/19 2355   03/31/19 2045  cefTRIAXone (ROCEPHIN) 1 g in sodium chloride 0.9 % 100 mL IVPB     1 g 200 mL/hr over 30 Minutes Intravenous  Once 03/31/19 2039 03/31/19 2143      Microbiology: Results for orders placed or performed during the hospital encounter of 03/31/19  SARS Coronavirus 2 (CEPHEID - Performed in [facility] hospital lab), Hosp Order     Status: None   Collection Time: 03/31/19  8:43 PM   Specimen: Nasopharyngeal Swab  Result Value Ref Range Status   SARS Coronavirus 2 NEGATIVE NEGATIVE Final    Comment: (NOTE) If result is NEGATIVE SARS-CoV-2 target nucleic acids are NOT DETECTED. The SARS-CoV-2 RNA is generally detectable in upper and lower  respiratory specimens during the acute phase of infection. The lowest  concentration of SARS-CoV-2 viral copies this assay can detect is 250  copies / mL. A negative result does not preclude SARS-CoV-2 infection  and should not be used as the sole basis for treatment or other  patient management decisions.  A negative result may occur with  improper specimen collection / handling, submission of specimen other  than nasopharyngeal swab, presence of viral mutation(s) within the  areas targeted by this assay, and inadequate number of viral copies  (<250 copies / mL). A negative result must be combined with clinical  observations, patient history, and epidemiological information. If result is POSITIVE SARS-CoV-2 target nucleic acids are DETECTED. The SARS-CoV-2 RNA is generally detectable in upper and lower  respiratory specimens dur ing the acute phase of infection.  Positive  results are indicative of active infection with SARS-CoV-2.  Clinical  correlation with patient history and other diagnostic information is  necessary to determine patient infection status.  Positive results do  not rule out bacterial infection or co-infection with other viruses. If result is PRESUMPTIVE POSTIVE SARS-CoV-2 nucleic acids MAY BE  PRESENT.   A presumptive positive result was obtained on the submitted specimen  and confirmed on repeat testing.  While 2019 novel coronavirus  (SARS-CoV-2) nucleic acids may be present in the submitted sample  additional confirmatory testing may be necessary for epidemiological  and / or clinical management purposes  to differentiate between  SARS-CoV-2 and other Sarbecovirus currently known to infect humans.  If clinically indicated additional testing with an alternate test  methodology [phone]) is advised. The SARS-CoV-2 RNA is generally  detectable in upper and lower respiratory sp ecimens during the acute  phase of infection. The expected result is Negative. Fact Sheet for Patients:  StrictlyIdeas.no Fact Sheet for Healthcare Providers: BankingDealers.co.za This test is not yet approved  or cleared by the [place] and has been authorized for detection and/or diagnosis of SARS-CoV-2 by FDA under an Emergency Use Authorization (EUA).  This EUA will remain in effect (meaning this test can be used) for the duration of the COVID-19 declaration under Section 564(b)(1) of the Act, 21 U.S.C. section 360bbb-3(b)(1), unless the authorization is terminated or revoked sooner. Performed at [facility], [address] [place], [place] [address]   Culture, blood (routine x 2)     Status: Abnormal   Collection Time: 03/31/19  8:43 PM   Specimen: BLOOD  Result Value Ref Range Status   Specimen Description   Final    BLOOD LEFT ANTECUBITAL Performed at [facility], [address], [place], [place] [address]    Special Requests   Final    BOTTLES DRAWN AEROBIC AND ANAEROBIC Blood Culture adequate volume Performed at [facility], [address], [place], [place] [address]    Culture  Setup Time   Final    GRAM POSITIVE COCCI AEROBIC BOTTLE ONLY CRITICAL VALUE NOTED.  VALUE IS CONSISTENT WITH PREVIOUSLY  REPORTED AND CALLED VALUE. Angelina Performed at [facility], [place] [address], [place], [place] [address]    Culture STAPHYLOCOCCUS EPIDERMIDIS (A)  Final   Report Status 04/04/2019 FINAL  Final   Organism ID, Bacteria STAPHYLOCOCCUS EPIDERMIDIS  Final      Susceptibility   Staphylococcus epidermidis - MIC*    CIPROFLOXACIN <=0.5 SENSITIVE Sensitive     ERYTHROMYCIN >=8 RESISTANT Resistant     GENTAMICIN <=0.5 SENSITIVE Sensitive     OXACILLIN >=4 RESISTANT Resistant     TETRACYCLINE <=1 SENSITIVE Sensitive     VANCOMYCIN 2 SENSITIVE Sensitive     TRIMETH/SULFA <=10 SENSITIVE Sensitive     CLINDAMYCIN <=0.25 SENSITIVE Sensitive     RIFAMPIN <=0.5 SENSITIVE Sensitive     Inducible Clindamycin NEGATIVE Sensitive     * STAPHYLOCOCCUS EPIDERMIDIS  Culture, blood (routine x 2)     Status: Abnormal   Collection Time: 03/31/19  8:43 PM   Specimen: BLOOD  Result Value Ref Range Status   Specimen Description   Final    BLOOD RIGHT ANTECUBITAL Performed at [facility], [address] [place], [place] [address]    Special Requests   Final    BOTTLES DRAWN AEROBIC AND ANAEROBIC Blood Culture adequate volume Performed at [facility], [address] [place], [place] [address]    Culture  Setup Time   Final    GRAM POSITIVE COCCI IN BOTH AEROBIC AND ANAEROBIC BOTTLES CRITICAL RESULT CALLED TO, READ BACK BY AND VERIFIED WITH: MYRA SLAUGHTER ON 04/01/2019 AT 1344 QSD GRAM STAIN REVIEWED-AGREE WITH RESULT T. TYSOR    Culture (A)  Final    STAPHYLOCOCCUS EPIDERMIDIS SUSCEPTIBILITIES PERFORMED ON PREVIOUS CULTURE WITHIN THE LAST 5 DAYS. Performed at [facility], [place] [address], [place], [place] [address]    Report Status 04/04/2019 FINAL  Final  Blood Culture ID Panel (Reflexed)     Status: Abnormal   Collection Time: 03/31/19  8:43 PM  Result Value Ref Range Status   Enterococcus species NOT DETECTED NOT DETECTED  Final   Listeria monocytogenes NOT DETECTED NOT DETECTED Final   Staphylococcus species DETECTED (A) NOT DETECTED Final    Comment: Methicillin (oxacillin) resistant coagulase negative staphylococcus. Possible blood culture contaminant (unless isolated from more than one blood culture draw or clinical case suggests pathogenicity). No  antibiotic treatment is indicated for blood  culture contaminants. CRITICAL RESULT CALLED TO, READ BACK BY AND VERIFIED WITH: MYRA SLAUGHTER ON 04/01/2019 AT 1344 QSD    Staphylococcus aureus (BCID) NOT DETECTED NOT DETECTED Final   Methicillin resistance DETECTED (A) NOT DETECTED Final    Comment: CRITICAL RESULT CALLED TO, READ BACK BY AND VERIFIED WITH: MYRA SLAUGHTER ON 04/01/2019 AT 1344 QSD    Streptococcus species NOT DETECTED NOT DETECTED Final   Streptococcus agalactiae NOT DETECTED NOT DETECTED Final   Streptococcus pneumoniae NOT DETECTED NOT DETECTED Final   Streptococcus pyogenes NOT DETECTED NOT DETECTED Final   Acinetobacter baumannii NOT DETECTED NOT DETECTED Final   Enterobacteriaceae species NOT DETECTED NOT DETECTED Final   Enterobacter cloacae complex NOT DETECTED NOT DETECTED Final   Escherichia coli NOT DETECTED NOT DETECTED Final   Klebsiella oxytoca NOT DETECTED NOT DETECTED Final   Klebsiella pneumoniae NOT DETECTED NOT DETECTED Final   Proteus species NOT DETECTED NOT DETECTED Final   Serratia marcescens NOT DETECTED NOT DETECTED Final   Haemophilus influenzae NOT DETECTED NOT DETECTED Final   Neisseria meningitidis NOT DETECTED NOT DETECTED Final   Pseudomonas aeruginosa NOT DETECTED NOT DETECTED Final   Candida albicans NOT DETECTED NOT DETECTED Final   Candida glabrata NOT DETECTED NOT DETECTED Final   Candida krusei NOT DETECTED NOT DETECTED Final   Candida parapsilosis NOT DETECTED NOT DETECTED Final   Candida tropicalis NOT DETECTED NOT DETECTED Final    Comment: Performed at [facility], [address]  [place], [place] [address]  Urine culture     Status: Abnormal   Collection Time: 03/31/19 10:13 PM   Specimen: Urine, Random  Result Value Ref Range Status   Specimen Description   Final    URINE, RANDOM Performed at [facility], [address], [place], [place] [address]    Special Requests   Final    NONE Performed at [facility], [address] [place], [place] [address]    Culture MULTIPLE SPECIES PRESENT, SUGGEST RECOLLECTION (A)  Final   Report Status 04/02/2019 FINAL  Final  MRSA PCR Screening     Status: None   Collection Time: 04/01/19  1:54 AM   Specimen: Nasopharyngeal  Result Value Ref Range Status   MRSA by PCR NEGATIVE NEGATIVE Final    Comment:        The GeneXpert MRSA Assay (FDA approved for NASAL specimens only), is one component of a comprehensive MRSA colonization surveillance program. It is not intended to diagnose MRSA infection nor to guide or monitor treatment for MRSA infections. Performed at [facility], [address] [place], [place] [address]   CULTURE, BLOOD (ROUTINE X 2) w Reflex to ID Panel     Status: None   Collection Time: 04/02/19  3:05 PM   Specimen: BLOOD RIGHT WRIST  Result Value Ref Range Status   Specimen Description BLOOD RIGHT WRIST  Final   Special Requests NONE  Final   Culture   Final    NO GROWTH 5 DAYS Performed at [facility], [address] [place], [place] [address]    Report Status 04/07/2019 FINAL  Final  Culture, blood (Routine X 2) w Reflex to ID Panel     Status: None   Collection Time: 04/02/19 10:45 PM   Specimen: BLOOD  Result Value Ref Range Status   Specimen Description BLOOD RIGHT ASSIST CONTROL  Final   Special Requests   Final    BOTTLES  DRAWN AEROBIC AND ANAEROBIC Blood Culture adequate volume   Culture   Final    NO GROWTH 5 DAYS Performed at [facility], [address] [place], [place] [address]    Report Status 04/07/2019 FINAL  Final    CULTURE, BLOOD (ROUTINE X 2) w Reflex to ID Panel     Status: None (Preliminary result)   Collection Time: 04/05/19  1:33 PM   Specimen: BLOOD RIGHT ARM  Result Value Ref Range Status   Specimen Description BLOOD RIGHT ARM  Final   Special Requests   Final    BOTTLES DRAWN AEROBIC AND ANAEROBIC Blood Culture results may not be optimal due to an excessive volume of blood received in culture bottles   Culture   Final    NO GROWTH 3 DAYS Performed at [facility], [address], [place], [place] [address]    Report Status PENDING  Incomplete  CULTURE, BLOOD (ROUTINE X 2) w Reflex to ID Panel     Status: None (Preliminary result)   Collection Time: 04/05/19  1:42 PM   Specimen: BLOOD LEFT ARM  Result Value Ref Range Status   Specimen Description BLOOD LEFT ARM  Final   Special Requests   Final    BOTTLES DRAWN AEROBIC AND ANAEROBIC Blood Culture results may not be optimal due to an excessive volume of blood received in culture bottles   Culture   Final    NO GROWTH 3 DAYS Performed at [facility], [address], [place], [place] [address]    Report Status PENDING  Incomplete  MRSA PCR Screening     Status: None   Collection Time: 04/07/19  9:08 AM   Specimen: Nasopharyngeal  Result Value Ref Range Status   MRSA by PCR NEGATIVE NEGATIVE Final    Comment:        The GeneXpert MRSA Assay (FDA approved for NASAL specimens only), is one component of a comprehensive MRSA colonization surveillance program. It is not intended to diagnose MRSA infection nor to guide or monitor treatment for MRSA infections. Performed at [facility], [address] [place], [place] [address]     Best Practice/Protocols:  VTE Prophylaxis: Mechanical GI Prophylaxis: Proton Pump Inhibitor CIWA  Events: 6/27 Re admitted to Stepdown due to resp failure/encephalopathy 6/28 Encephalopathy resolved off BiPAP    Studies: Dg Chest 1 View  Result Date:  04/04/2019 CLINICAL DATA:  Fever. Admitted for gastrointestinal bleeding. History of lung cancer and hypertension. EXAM: CHEST  1 VIEW COMPARISON:  Radiographs 04/02/2019, 04/01/2019 and 03/31/2019. FINDINGS: 1358 hours. The enteric tube has been removed. The heart size and mediastinal contours are stable. There are persistent low lung volumes with bibasilar infiltrates. Overall aeration of the right lung base has mildly improved. There is no pneumothorax or significant pleural effusion. The bones appear unchanged. IMPRESSION: Persistent bibasilar infiltrates suspicious for pneumonia, possibly on the basis of aspiration. Overall right basilar aeration has mildly improved. Electronically Signed   By: Richardean Sale M.D.   On: 04/04/2019 15:56   Dg Abd 1 View  Result Date: 04/01/2019 CLINICAL DATA:  NG tube placement. EXAM: ABDOMEN - 1 VIEW COMPARISON:  None. FINDINGS: Tip and side port of the enteric tube below the diaphragm in the stomach. High-density material in the colon suggestive of enteric contrast. No bowel dilatation in the upper abdomen. IMPRESSION: Tip and side port of the enteric tube below the diaphragm in the stomach. Electronically Signed   By: Aurther Loft.D.  On: 04/01/2019 03:53   Ct Head Wo Contrast  Result Date: 03/31/2019 CLINICAL DATA:  Head trauma EXAM: CT HEAD WITHOUT CONTRAST TECHNIQUE: Contiguous axial images were obtained from the base of the skull through the vertex without intravenous contrast. COMPARISON:  None. FINDINGS: Brain: No acute territorial infarction, hemorrhage, or intracranial mass. Atrophy and mild small vessel ischemic changes of the white matter. Slight ventricular prominence felt secondary to atrophy. Vascular: No hyperdense vessels. Scattered calcifications at the carotid siphon Skull: Normal. Negative for fracture or focal lesion. Sinuses/Orbits: No acute finding. Mucosal thickening in the right maxillary sinus. Other: None IMPRESSION: 1. No CT evidence  for acute intracranial abnormality. 2. Atrophy and mild small vessel ischemic changes of the white matter Electronically Signed   By: Donavan Foil M.D.   On: 03/31/2019 20:23   Dg Chest Port 1 View  Result Date: 04/08/2019 CLINICAL DATA:  Pneumonia EXAM: PORTABLE CHEST 1 VIEW COMPARISON:  Chest x-rays dated 04/07/2019 and 04/05/2019. FINDINGS: Continued airspace opacities throughout the RIGHT lung, without significant change. Patchy hazy opacities also persist within the LEFT perihilar and lower lung zones, unchanged. Probable RIGHT pleural effusion. No pneumothorax seen. Stable cardiomegaly. IMPRESSION: No significant interval change. Persistent multifocal/bilateral pneumonia. Electronically Signed   By: Franki Cabot M.D.   On: 04/08/2019 06:43   Dg Chest Port 1 View  Result Date: 04/07/2019 CLINICAL DATA:  Hypoxia.  Reported history of lung carcinoma EXAM: PORTABLE CHEST 1 VIEW COMPARISON:  April 05, 2019 FINDINGS: There is widespread airspace consolidation throughout the right lung. There is patchy infiltrate in the left base which is stable. There is new patchy infiltrate in the left upper lobe. Heart is mildly enlarged with pulmonary vascularity normal. No adenopathy. There is aortic atherosclerosis. No bone lesions. IMPRESSION: Widespread airspace opacity consistent with multifocal pneumonia, essentially stable the right and in the left base. New patchy infiltrate left upper lobe. Stable cardiomegaly.  Aortic Atherosclerosis (ICD10-I70.0). Electronically Signed   By: Lowella Grip III M.D.   On: 04/07/2019 11:34   Dg Chest Port 1 View  Result Date: 04/05/2019 CLINICAL DATA:  Fever.  History of lung cancer. EXAM: PORTABLE CHEST 1 VIEW COMPARISON:  Chest x-ray from yesterday. FINDINGS: Stable cardiomediastinal silhouette. Persistent low lung volumes with bilateral infiltrates, increasing in the right upper lobe. No pleural effusion or pneumothorax. No acute osseous abnormality. IMPRESSION: 1.  Bilateral infiltrates, worsened in the right upper lobe. Electronically Signed   By: Titus Dubin M.D.   On: 04/05/2019 15:28   Dg Chest Port 1 View  Result Date: 04/02/2019 CLINICAL DATA:  Respiratory failure.  History of lung cancer. EXAM: PORTABLE CHEST 1 VIEW COMPARISON:  04/01/2019.  03/31/2019. FINDINGS: NG tube in stable position. Stable cardiomegaly. Persistent progressed bilateral pulmonary infiltrates/edema again noted. Interim bibasilar atelectasis. No pleural effusion or pneumothorax. IMPRESSION: 1.  NG tube stable position. 2.  Stable cardiomegaly 3. Persistent progressive bilateral pulmonary infiltrates/edema noted. Bibasilar atelectasis. Electronically Signed   By: Marcello Moores  Register   On: 04/02/2019 07:23   Portable Chest 1 View  Result Date: 04/01/2019 CLINICAL DATA:  Dyspnea. EXAM: PORTABLE CHEST 1 VIEW COMPARISON:  Radiographs yesterday. FINDINGS: Enteric tube in place with tip and side-port below the diaphragm. Slight progression in heterogeneous bilateral bibasilar opacities from prior exam. Borderline cardiomegaly is again seen. Low lung volumes. No pleural effusion or pneumothorax. IMPRESSION: 1. Slight progression in bibasilar heterogeneous opacities from prior exam, may be aspiration or pneumonia. 2. Lower lung volumes from prior. Electronically Signed   By:  Keith Rake M.D.   On: 04/01/2019 03:55   Dg Chest Port 1 View  Result Date: 03/31/2019 CLINICAL DATA:  Weakness EXAM: PORTABLE CHEST 1 VIEW COMPARISON:  CT 12/09/2017 FINDINGS: Mild reticular opacities at the bases. No consolidation or effusion. Borderline heart size. No pneumothorax. IMPRESSION: Mild reticular opacities in the right greater than left lung base, could reflect atypical infection. Borderline cardiomegaly. Electronically Signed   By: Donavan Foil M.D.   On: 03/31/2019 20:40   Dg Knee Complete 4 Views Left  Result Date: 03/31/2019 CLINICAL DATA:  Status post fall. EXAM: LEFT KNEE - COMPLETE 4+ VIEW  COMPARISON:  None. FINDINGS: No evidence of fracture, or dislocation. Small suprapatellar joint effusion. Post total left knee arthroplasty with normal alignment of the orthopedic components and no evidence of loosening. Mild suprapatellar soft tissue swelling. IMPRESSION: 1. No acute fracture or dislocation identified about the left knee. 2. Small suprapatellar joint effusion. Electronically Signed   By: Fidela Salisbury M.D.   On: 03/31/2019 20:40    Consults: Treatment Team:  Tsosie Billing, MD Tyler Pita, MD   Subjective:    Overnight Issues: Uneventful night.  Did have to have a sitter last night transiently.  This morning awake, alert and oriented!.  Very cooperative.  Drinking water without difficulty.  Hungry, wants to eat.  Objective:  Vital signs for last 24 hours: Temp:  [98.1 F (36.7 C)-99.8 F (37.7 C)] 99.8 F (37.7 C) (06/28 0800) Pulse Rate:  [77-102] 85 (06/28 0800) Resp:  [17-36] 20 (06/28 0800) BP: (90-136)/(58-93) 106/65 (06/28 0800) SpO2:  [84 %-100 %] 92 % (06/28 0800) FiO2 (%):  [40 %-55 %] 55 % (06/28 0800) Weight:  [82.3 kg-83.8 kg] 83.8 kg (06/28 0435)  Hemodynamic parameters for last 24 hours:    Intake/Output from previous day: 06/27 0701 - 06/28 0700 In: 756 [I.V.:6; IV Piggyback:750] Out: 700 [Urine:700]  Intake/Output this shift: Total I/O In: 490 [P.O.:480; I.V.:10] Out: -   Vent settings for last 24 hours: FiO2 (%):  [40 %-55 %] 55 %  Physical Exam:  Gen: Fully awake, alert, oriented, NAD, on high flow O2  HEENT: NCAT, Mild scleral icterus Neck: No JVD noted, scar from prior surgery on the right Lungs: breath sounds full, scattered rhonchi but not as pronounced as prior, no wheezes Cardiovascular: RRR, no murmurs Abdomen: Soft, nontender, normal BS Ext: Excoriations/abrasions over both knees, various stages of healing.  Plus pitting edema to midshin Neuro: Awake, alert, conversant and fluent.  Follows commands, no  overt focal deficit. Skin: Limited exam, excoriations and abrasions as noted above  Chest x-ray reviewed independently as below:    Assessment/Plan:   1.  Acute hypoxemic respiratory failure/aspiration pneumonia: Triggered by aspiration in the setting of  encephalopathy.  He has been switched to high flow O2, tolerating this well.  Titrate O2 for sats of 88 to 92%.  He appears to be drinking well without evidence of aspiration today as his encephalopathy has resolved.  Continue Rocephin 2 g IV daily, monitor.  Note that x-ray findings may take up to 8 weeks to clear.  Follow clinical picture rather than radiographic picture for now.  Do follow radiographs until clearing.  Patient also had evidence of mild volume overload (elevated BNP and edema) and will repeat Lasix 20 mg IV x1.  2.  Encephalopathy in the setting of alcohol withdrawal, metabolic derangements: Consider Warnicke Korsakoff syndrome versus alcoholic pellagra.  He received high-dose thiamine yesterday, give high dose x5 days  then decrease to 100 mg daily, supplementing  niacin with 500 mg p.o at at bedtime.  Avoid standing dose sedative medications and only give benzodiazepines on an as needed basis according to CIWA scale, and as needed Haldol for agitation.  However, I doubt the patient will require further CIWA scale dosing.   3.  Dysphagia: Aggravated by the above, he appears to be taking p.o.'s reliably today as his mental status has cleared.  Advance diet as tolerated and per speech pathology recommendations from prior.  4.  Coag negative staph bacteremia: (3 out of 4 blood cultures positive) on vancomycin, ID following.  5.  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  Ammonia level normal.  Mild elevations, monitor.  6.  Upper GI bleed due to erosive esophagitis: Continue Protonix.  No evidence of rebleed.  7.  Social issues: Patient has no family and currently no support from friends.  Will get social work consult.   Patient may require skilled nursing facility placement for rehabilitation.    LOS: 8 days   Additional comments: Care coordination done with bedside nurse.  Critical Care Total Time*:   C. Derrill Kay, MD [facility] 04/08/2019  *Care during the described time interval was provided by me and/or other providers on the critical care team.  I have reviewed this patient's available data, including medical history, events of note, physical examination and test results as part of my evaluation.

## 2019-04-09 DIAGNOSIS — Z923 Personal history of irradiation: Secondary | ICD-10-CM

## 2019-04-09 DIAGNOSIS — F1010 Alcohol abuse, uncomplicated: Secondary | ICD-10-CM

## 2019-04-09 LAB — CBC
HCT: 26.9 % — ABNORMAL LOW (ref 39.0–52.0)
Hemoglobin: 7.5 g/dL — ABNORMAL LOW (ref 13.0–17.0)
MCH: 22.3 pg — ABNORMAL LOW (ref 26.0–34.0)
MCHC: 27.9 g/dL — ABNORMAL LOW (ref 30.0–36.0)
MCV: 80.1 fL (ref 80.0–100.0)
Platelets: 313 10*3/uL (ref 150–400)
RBC: 3.36 MIL/uL — ABNORMAL LOW (ref 4.22–5.81)
WBC: 16 10*3/uL — ABNORMAL HIGH (ref 4.0–10.5)
nRBC: 0 % (ref 0.0–0.2)

## 2019-04-09 LAB — MAGNESIUM: Magnesium: 2 mg/dL (ref 1.7–2.4)

## 2019-04-09 LAB — BASIC METABOLIC PANEL
Anion gap: 9 (ref 5–15)
BUN: 8 mg/dL (ref 8–23)
CO2: 23 mmol/L (ref 22–32)
Calcium: 7.1 mg/dL — ABNORMAL LOW (ref 8.9–10.3)
Chloride: 106 mmol/L (ref 98–111)
Creatinine, Ser: 0.37 mg/dL — ABNORMAL LOW (ref 0.61–1.24)
GFR calc Af Amer: >60 mL/min (ref >60)
GFR calc non Af Amer: >60 mL/min (ref >60)
Glucose, Bld: 144 mg/dL — ABNORMAL HIGH (ref 70–99)
Potassium: 3.3 mmol/L — ABNORMAL LOW (ref 3.5–5.1)
Sodium: 138 mmol/L (ref 135–145)

## 2019-04-09 LAB — PHOSPHORUS
Phosphorus: <1 mg/dL — CL (ref 2.5–4.6)
Phosphorus: 2.2 mg/dL — ABNORMAL LOW (ref 2.5–4.6)

## 2019-04-09 LAB — POTASSIUM: Potassium: 3.4 mmol/L — ABNORMAL LOW (ref 3.5–5.1)

## 2019-04-09 MED ORDER — K PHOS MONO-SOD PHOS DI & MONO 155-852-130 MG PO TABS
500.0000 mg | ORAL_TABLET | ORAL | Status: AC
  Administered 2019-04-09 – 2019-04-10 (×4): 500 mg via ORAL
  Filled 2019-04-09 (×4): qty 2

## 2019-04-09 MED ORDER — LORAZEPAM 2 MG/ML IJ SOLN
INTRAMUSCULAR | Status: AC
  Administered 2019-04-09: 11:00:00 2 mg via INTRAVENOUS
  Filled 2019-04-09: qty 1

## 2019-04-09 MED ORDER — LORAZEPAM 2 MG/ML IJ SOLN
INTRAMUSCULAR | Status: AC
  Administered 2019-04-09: 1 mg via INTRAVENOUS
  Filled 2019-04-09: qty 1

## 2019-04-09 MED ORDER — LORAZEPAM 2 MG/ML IJ SOLN
2.0000 mg | Freq: Once | INTRAMUSCULAR | Status: AC
  Administered 2019-04-09: 11:00:00 2 mg via INTRAVENOUS

## 2019-04-09 MED ORDER — IPRATROPIUM-ALBUTEROL 0.5-2.5 (3) MG/3ML IN SOLN: 3.0000 mL | RESPIRATORY_TRACT | Status: DC | PRN

## 2019-04-09 MED ORDER — DEXMEDETOMIDINE HCL IN NACL 400 MCG/100ML IV SOLN
0.2000 ug/kg/h | INTRAVENOUS | Status: DC
  Administered 2019-04-09: 21:00:00 0.4 ug/kg/h via INTRAVENOUS
  Administered 2019-04-09: 17:00:00 0.6 ug/kg/h via INTRAVENOUS
  Filled 2019-04-09 (×2): qty 100

## 2019-04-09 MED ORDER — POTASSIUM PHOSPHATES 15 MMOLE/5ML IV SOLN
30.0000 mmol | Freq: Once | INTRAVENOUS | Status: AC
  Administered 2019-04-09: 30 mmol via INTRAVENOUS
  Filled 2019-04-09: qty 10

## 2019-04-09 MED ORDER — LORAZEPAM 2 MG/ML IJ SOLN
1.0000 mg | INTRAMUSCULAR | Status: DC | PRN
  Administered 2019-04-09 – 2019-04-15 (×2): 1 mg via INTRAVENOUS
  Filled 2019-04-09 (×2): qty 1

## 2019-04-09 MED ORDER — DIAZEPAM 5 MG PO TABS
5.0000 mg | ORAL_TABLET | Freq: Four times a day (QID) | ORAL | Status: DC
  Administered 2019-04-09 – 2019-04-15 (×22): 5 mg via ORAL
  Filled 2019-04-09: qty 1
  Filled 2019-04-09: qty 3
  Filled 2019-04-09: qty 1
  Filled 2019-04-09: qty 3
  Filled 2019-04-09: qty 1
  Filled 2019-04-09: qty 3
  Filled 2019-04-09: qty 1
  Filled 2019-04-09: qty 3
  Filled 2019-04-09: qty 1
  Filled 2019-04-09: qty 3
  Filled 2019-04-09 (×4): qty 1
  Filled 2019-04-09: qty 3
  Filled 2019-04-09 (×4): qty 1
  Filled 2019-04-09 (×2): qty 3
  Filled 2019-04-09 (×2): qty 1

## 2019-04-09 NOTE — Progress Notes (Signed)
CRITICAL CARE NOTE         SUBJECTIVE FINDINGS & SIGNIFICANT EVENTS   63 year old chronic alcoholic, admitted with encephalopathy likely alcoholic in etiology as well as coffee-ground emesis which has resolved now..  Had septic work-up which has been negative thus far, previous admission for staph bacteremia, and aspiration pneumonia.  -Today patient with component of confabulation and confusion but clinically improved with resolution of upper GI bleeding.  H/H borderline low, status post gastroenterology evaluation.  -Electrolyte imbalance with possible refeeding syndrome, dietitian and clinical pharmacologist evaluation in progress  PAST MEDICAL HISTORY   Past Medical History:  Diagnosis Date  . ED (erectile dysfunction)   . Hypercholesteremia   . Hypertension   . Lung cancer [facility])      SURGICAL HISTORY   Past Surgical History:  Procedure Laterality Date  . HERNIA REPAIR  1958  . REPLACEMENT TOTAL KNEE BILATERAL  2009  . TONSILLECTOMY  2008     FAMILY HISTORY   Family History  Problem Relation Age of Onset  . Cancer Mother   . Hypertension Father   . Cancer Paternal Grandmother      SOCIAL HISTORY   Social History   Tobacco Use  . Smoking status: Never Smoker  . Smokeless tobacco: Never Used  Substance Use Topics  . Alcohol use: No    Frequency: Never    Comment: Quit 2 days ago  . Drug use: No     MEDICATIONS   Current Medication:  Current Facility-Administered Medications:  .  0.9 %  sodium chloride infusion, , Intravenous, PRN, Mayo, Pete Pelt, MD, Stopped at 04/06/19 0201 .  acetaminophen (TYLENOL) tablet 650 mg, 650 mg, Oral, Q6H PRN, Mayo, Pete Pelt, MD, 650 mg at 04/08/19 1515 .  calcium carbonate (TUMS - dosed in mg elemental calcium) chewable tablet 400 mg of  elemental calcium, 2 tablet, Oral, Q8H PRN, Mayo, Pete Pelt, MD .  cefTRIAXone (ROCEPHIN) 2 g in sodium chloride 0.9 % 100 mL IVPB, 2 g, Intravenous, Q24H, Tyler Pita, MD, Last Rate: 200 mL/hr at 04/08/19 1439, 2 g at 04/08/19 1439 .  folic acid injection 1 mg, 1 mg, Intravenous, Daily, Lu Duffel, RPH, 1 mg at 04/08/19 1324 .  guaiFENesin-dextromethorphan (ROBITUSSIN DM) 100-10 MG/5ML syrup 5 mL, 5 mL, Oral, Q4H PRN, Tyler Pita, MD, 5 mL at 04/08/19 2112 .  haloperidol lactate (HALDOL) injection 1 mg, 1 mg, Intravenous, Q6H PRN, Tyler Pita, MD, 1 mg at 04/07/19 2313 .  ipratropium-albuterol (DUONEB) 0.5-2.5 (3) MG/3ML nebulizer solution 3 mL, 3 mL, Nebulization, Q6H, Mayo, Pete Pelt, MD, 3 mL at 04/09/19 0847 .  MEDLINE mouth rinse, 15 mL, Mouth Rinse, BID, Wilhelmina Mcardle, MD, 15 mL at 04/08/19 2115 .  multivitamin with minerals tablet 1 tablet, 1 tablet, Oral, Daily, Mayo, Pete Pelt, MD, 1 tablet at 04/08/19 4010 .  niacin tablet 500 mg, 500 mg, Oral, QHS, Tyler Pita, MD, 500 mg at 04/08/19 2112 .  [DISCONTINUED] ondansetron (ZOFRAN) tablet 4 mg, 4 mg, Oral, Q6H PRN **OR** ondansetron (ZOFRAN) injection 4 mg, 4 mg, Intravenous, Q6H PRN, Seals, Angela H, NP, 4 mg at 04/04/19 0249 .  pantoprazole (PROTONIX) injection 40 mg, 40 mg, Intravenous, Q12H, Loletha Grayer, MD, 40 mg at 04/08/19 2112 .  potassium PHOSPHATE 30 mmol in dextrose 5 % 500 mL infusion, 30 mmol, Intravenous, Once, Charlett Nose, RPH .  sodium chloride flush (NS) 0.9 % injection 10-40 mL, 10-40 mL, Intracatheter, PRN,  Wieting, [place], MD .  sodium chloride flush (NS) 0.9 % injection 3 mL, 3 mL, Intravenous, Q12H, Seals, Angela H, NP, 3 mL at 04/08/19 2113 .  thiamine 500mg  in normal saline (25ml) IVPB, 500 mg, Intravenous, Q24H, Tyler Pita, MD, Last Rate: 100 mL/hr at 04/08/19 2016, 500 mg at 04/08/19 2016 .  vancomycin (VANCOCIN) 1,500 mg in sodium chloride 0.9 % 500 mL  IVPB, 1,500 mg, Intravenous, Q12H, Hallaji, Sheema M, RPH, Last Rate: 250 mL/hr at 04/08/19 2250, 1,500 mg at 04/08/19 2250 .  vitamin C (ASCORBIC ACID) tablet 500 mg, 500 mg, Oral, BID, Leslye Peer, Olon, MD, 500 mg at 04/08/19 2112    ALLERGIES   Patient has no known allergies.    REVIEW OF SYSTEMS    10 point ROS conducted and is negative except as per subjective findings.  Patient reports having doctors appointment in the community which he wants to rush out to go see, as well as a missing VCR.  PHYSICAL EXAMINATION   Vitals:   04/09/19 0600 04/09/19 0848  BP: (!) 122/93   Pulse: 99 85  Resp: (!) 24 (!) 21  Temp:    SpO2: 96%     GENERAL: Anxious appearing HEAD: Normocephalic, atraumatic.  EYES: Pupils equal, round, reactive to light.  No scleral icterus.  MOUTH: Moist mucosal membrane. NECK: Supple. No thyromegaly. No nodules. No JVD.  PULMONARY: Rhonchorous breath sounds worse on the right CARDIOVASCULAR: S1 and S2. Regular rate and rhythm. No murmurs, rubs, or gallops.  GASTROINTESTINAL: Soft, nontender, non-distended. No masses. Positive bowel sounds. No hepatosplenomegaly.  MUSCULOSKELETAL: No swelling, clubbing, or edema.  NEUROLOGIC: Mild distress due to acute illness SKIN:intact,warm,dry   LABS AND IMAGING     LAB RESULTS: Recent Labs  Lab 04/07/19 0458 04/08/19 0315 04/09/19 0414  NA 139 140 138  K 3.7 3.6 3.3*  CL 110 108 106  CO2 21* 24 23  BUN 9 10 8   CREATININE 0.47* 0.43* 0.37*  GLUCOSE 97 94 144*   Recent Labs  Lab 04/07/19 0458 04/07/19 1720 04/09/19 0414  HGB 7.1* 7.8* 7.5*  HCT 25.1* 29.1* 26.9*  WBC 12.7* 11.7* 16.0*  PLT 282 277 313     IMAGING RESULTS: No results found.    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   Refeeding syndrome  -Phos less than 1 -Multiple electrolyte abnormalities -Appreciate dietitian pharmacology evaluation  Acute blood loss anemia Due to upper GI bleed with coffee-ground emesis -On Protonix IV -Status post GI evaluation-appreciate input    Staph epidermidis bacteremia  -Multiple blood cultures positive -Continue with current IV antibiotics and vancomycin appreciate pharmacy -follow up cultures -consider stress dose steroids   ID -continue IV abx as prescibed -follow up cultures  GI/Nutrition GI PROPHYLAXIS as indicated DIET-->TF's as tolerated Constipation protocol as indicated  ENDO - ICU hypoglycemic\Hyperglycemia protocol -check FSBS per protocol   ELECTROLYTES -follow labs as needed -replace as needed -pharmacy consultation   DVT/GI PRX ordered -SCDs  TRANSFUSIONS AS NEEDED MONITOR FSBS ASSESS the need for LABS as needed   Critical care provider statement:    Critical care time (minutes):  32   Critical care time was exclusive of:  Separately billable procedures and treating other patients   Critical care was necessary to treat or prevent imminent or life-threatening deterioration of the following conditions:   Acute hypoxemic respiratory failure due to aspiration pneumonia, alcoholic encephalopathy, multiple metabolic derangements, dysphagia, coagulation negative staph bacteremia, transaminitis, upper GI bleeding due to erosive esophagitis, multiple comorbid conditions   Critical care was time spent personally by me on the following activities:  Development of treatment plan with patient or surrogate, discussions with consultants, evaluation of patient's response to treatment, examination of patient, obtaining history from patient or surrogate, ordering and performing  treatments and interventions, ordering and review of laboratory studies and re-evaluation of patient's condition.  I assumed direction of critical care for this patient from another provider in my specialty: no    This document was prepared using Dragon voice recognition software and may include unintentional dictation errors.    Ottie Glazier, M.D.  Division of [place]

## 2019-04-09 NOTE — Progress Notes (Signed)
[name], male   DOB: 06/09/1956, 63 y.o.   MRN: [id number]  [employer] PROGRESS NOTE  George Wade HGD:[id number] DOB: [birth year] DOA: 03/31/2019 PCP: Patient, [name] Per  HPI/Subjective: Patient is off BiPAP high flow oxygen currently on 4 L of oxygen feels much better awake and alert Objective: Vitals:   04/09/19 0600 04/09/19 0848  BP: (!) 122/93   Pulse: 99 85  Resp: (!) 24 (!) 21  Temp:    SpO2: 96%     Filed Weights   03/31/19 1912 04/07/19 0906 04/08/19 0435  Weight: 78 kg 82.3 kg 83.8 kg    ROS: Review of Systems  Constitutional: Negative for chills and fever.  HENT: Negative for nosebleeds and tinnitus.   Eyes: Negative for blurred vision.  Respiratory: Positive for cough. Negative for shortness of breath.   Cardiovascular: Negative for palpitations.  Gastrointestinal: Negative for abdominal pain.  Genitourinary: Negative for dysuria and frequency.  Skin: Negative for itching.  Neurological: Negative for dizziness, tremors and weakness.  Psychiatric/Behavioral: Negative for suicidal ideas. The patient is not nervous/anxious.    Exam: Physical Exam  HENT:  Nose: No mucosal edema.  Mouth/Throat: No oropharyngeal exudate or posterior oropharyngeal edema.  Eyes: Pupils are equal, round, and reactive to light. Conjunctivae, EOM and lids are normal.  Neck: No JVD present. Carotid bruit is not present. No edema present. No thyroid mass and no thyromegaly present.  Cardiovascular: S1 normal and S2 normal. Exam reveals no gallop.  No murmur heard. Pulses:      Dorsalis pedis pulses are 2+ on the right side and 2+ on the left side.  Respiratory: No respiratory distress. He has decreased breath sounds in the right lower field and the left lower field. He has no wheezes. He has rhonchi in the right lower field and the left lower field. He has no rales.  Positive crackles bilaterally, diminished breath sounds  GI: Soft. Bowel sounds are normal.  There is no abdominal tenderness.  Musculoskeletal:     Right ankle: He exhibits swelling.     Left ankle: He exhibits swelling.  Lymphadenopathy:    He has no cervical adenopathy.  Neurological: He is alert.  Skin: Skin is warm. Nails show no clubbing.  Skin abrasions bilateral knees.  Psychiatric: His affect is blunt.      Data Reviewed: Basic Metabolic Panel: Recent Labs  Lab 04/04/19 1754 04/05/19 0424 04/06/19 0742 04/07/19 0458 04/08/19 0315 04/09/19 0414  NA  --  137 139 139 140 138  K 3.4* 3.7 3.4* 3.7 3.6 3.3*  CL  --  104 105 110 108 106  CO2  --  24 24 21* 24 23  GLUCOSE  --  110* 103* 97 94 144*  BUN  --  <5* 7* 9 10 8   CREATININE  --  0.39* 0.42* 0.47* 0.43* 0.37*  CALCIUM  --  7.9* 7.5* 6.9* 7.0* 7.1*  MG 1.9 1.9  --  2.0 2.3 2.0  PHOS  --  1.3* 1.5* 2.2* 1.6* <1.0*   Liver Function Tests: Recent Labs  Lab 04/04/19 0307 04/07/19 0458  AST 201* 80*  ALT 125* 76*  ALKPHOS 201* 213*  BILITOT 4.1* 3.6*  PROT 5.1* 5.0*  ALBUMIN 2.3* 2.0*   CBC: Recent Labs  Lab 04/04/19 0307 04/05/19 0424 04/07/19 0458 04/07/19 1720 04/09/19 0414  WBC 11.5* 16.5* 12.7* 11.7* 16.0*  HGB 7.9* 8.4* 7.1* 7.8* 7.5*  HCT 27.9* 29.9* 25.1* 29.1* 26.9*  MCV 79.0* 80.8  80.4 83.6 80.1  PLT 152 206 282 277 313   Cardiac Enzymes: No results for input(s): CKTOTAL, CKMB, CKMBINDEX, TROPONINI in the last 168 hours.  CBG: Recent Labs  Lab 04/02/19 1139 04/03/19 0009 04/03/19 0603 04/07/19 0901  GLUCAP 118* 107* 104* 87    Recent Results (from the past 240 hour(s))  SARS Coronavirus 2 (CEPHEID - Performed in [facility] hospital lab), Hosp Order     Status: None   Collection Time: 03/31/19  8:43 PM   Specimen: Nasopharyngeal Swab  Result Value Ref Range Status   SARS Coronavirus 2 NEGATIVE NEGATIVE Final    Comment: (NOTE) If result is NEGATIVE SARS-CoV-2 target nucleic acids are NOT DETECTED. The SARS-CoV-2 RNA is generally detectable in upper and lower   respiratory specimens during the acute phase of infection. The lowest  concentration of SARS-CoV-2 viral copies this assay can detect is 250  copies / mL. A negative result does not preclude SARS-CoV-2 infection  and should not be used as the sole basis for treatment or other  patient management decisions.  A negative result may occur with  improper specimen collection / handling, submission of specimen other  than nasopharyngeal swab, presence of viral mutation(s) within the  areas targeted by this assay, and inadequate number of viral copies  (<250 copies / mL). A negative result must be combined with clinical  observations, patient history, and epidemiological information. If result is POSITIVE SARS-CoV-2 target nucleic acids are DETECTED. The SARS-CoV-2 RNA is generally detectable in upper and lower  respiratory specimens dur ing the acute phase of infection.  Positive  results are indicative of active infection with SARS-CoV-2.  Clinical  correlation with patient history and other diagnostic information is  necessary to determine patient infection status.  Positive results do  not rule out bacterial infection or co-infection with other viruses. If result is PRESUMPTIVE POSTIVE SARS-CoV-2 nucleic acids MAY BE PRESENT.   A presumptive positive result was obtained on the submitted specimen  and confirmed on repeat testing.  While 2019 novel coronavirus  (SARS-CoV-2) nucleic acids may be present in the submitted sample  additional confirmatory testing may be necessary for epidemiological  and / or clinical management purposes  to differentiate between  SARS-CoV-2 and other Sarbecovirus currently known to infect humans.  If clinically indicated additional testing with an alternate test  methodology [phone]) is advised. The SARS-CoV-2 RNA is generally  detectable in upper and lower respiratory sp ecimens during the acute  phase of infection. The expected result is Negative. Fact  Sheet for Patients:  StrictlyIdeas.no Fact Sheet for Healthcare Providers: BankingDealers.co.za This test is not yet approved or cleared by the [place] FDA and has been authorized for detection and/or diagnosis of SARS-CoV-2 by FDA under an Emergency Use Authorization (EUA).  This EUA will remain in effect (meaning this test can be used) for the duration of the COVID-19 declaration under Section 564(b)(1) of the Act, 21 U.S.C. section 360bbb-3(b)(1), unless the authorization is terminated or revoked sooner. Performed at [facility], [address] [place], [place] [address]   Culture, blood (routine x 2)     Status: Abnormal   Collection Time: 03/31/19  8:43 PM   Specimen: BLOOD  Result Value Ref Range Status   Specimen Description   Final    BLOOD LEFT ANTECUBITAL Performed at [facility], [address], [place], [place] [address]    Special Requests   Final    BOTTLES DRAWN AEROBIC AND ANAEROBIC  Blood Culture adequate volume Performed at [facility], [address] [place], [place] [address]    Culture  Setup Time   Final    GRAM POSITIVE COCCI AEROBIC BOTTLE ONLY CRITICAL VALUE NOTED.  VALUE IS CONSISTENT WITH PREVIOUSLY REPORTED AND CALLED VALUE. [place] Performed at [facility], [place] [address], [place], [place] [address]    Culture STAPHYLOCOCCUS EPIDERMIDIS (A)  Final   Report Status 04/04/2019 FINAL  Final   Organism ID, Bacteria STAPHYLOCOCCUS EPIDERMIDIS  Final      Susceptibility   Staphylococcus epidermidis - MIC*    CIPROFLOXACIN <=0.5 SENSITIVE Sensitive     ERYTHROMYCIN >=8 RESISTANT Resistant     GENTAMICIN <=0.5 SENSITIVE Sensitive     OXACILLIN >=4 RESISTANT Resistant     TETRACYCLINE <=1 SENSITIVE Sensitive     VANCOMYCIN 2 SENSITIVE Sensitive     TRIMETH/SULFA <=10 SENSITIVE Sensitive     CLINDAMYCIN <=0.25 SENSITIVE  Sensitive     RIFAMPIN <=0.5 SENSITIVE Sensitive     Inducible Clindamycin NEGATIVE Sensitive     * STAPHYLOCOCCUS EPIDERMIDIS  Culture, blood (routine x 2)     Status: Abnormal   Collection Time: 03/31/19  8:43 PM   Specimen: BLOOD  Result Value Ref Range Status   Specimen Description   Final    BLOOD RIGHT ANTECUBITAL Performed at [facility], [address], [place], [place] [address]    Special Requests   Final    BOTTLES DRAWN AEROBIC AND ANAEROBIC Blood Culture adequate volume Performed at [facility], [address] [place], [place] [address]    Culture  Setup Time   Final    GRAM POSITIVE COCCI IN BOTH AEROBIC AND ANAEROBIC BOTTLES CRITICAL RESULT CALLED TO, READ BACK BY AND VERIFIED WITH: MYRA SLAUGHTER ON 04/01/2019 AT 1344 QSD GRAM STAIN REVIEWED-AGREE WITH RESULT T. TYSOR    Culture (A)  Final    STAPHYLOCOCCUS EPIDERMIDIS SUSCEPTIBILITIES PERFORMED ON PREVIOUS CULTURE WITHIN THE LAST 5 DAYS. Performed at [facility], [place] [address] [place], [place] [address]    Report Status 04/04/2019 FINAL  Final  Blood Culture ID Panel (Reflexed)     Status: Abnormal   Collection Time: 03/31/19  8:43 PM  Result Value Ref Range Status   Enterococcus species NOT DETECTED NOT DETECTED Final   Listeria monocytogenes NOT DETECTED NOT DETECTED Final   Staphylococcus species DETECTED (A) NOT DETECTED Final    Comment: Methicillin (oxacillin) resistant coagulase negative staphylococcus. Possible blood culture contaminant (unless isolated from more than one blood culture draw or clinical case suggests pathogenicity). No antibiotic treatment is indicated for blood  culture contaminants. CRITICAL RESULT CALLED TO, READ BACK BY AND VERIFIED WITH: MYRA SLAUGHTER ON 04/01/2019 AT 1344 QSD    Staphylococcus aureus (BCID) NOT DETECTED NOT DETECTED Final   Methicillin resistance DETECTED (A) NOT DETECTED Final    Comment: CRITICAL RESULT CALLED TO, READ BACK BY AND  VERIFIED WITH: MYRA SLAUGHTER ON 04/01/2019 AT 1344 QSD    Streptococcus species NOT DETECTED NOT DETECTED Final   Streptococcus agalactiae NOT DETECTED NOT DETECTED Final   Streptococcus pneumoniae NOT DETECTED NOT DETECTED Final   Streptococcus pyogenes NOT DETECTED NOT DETECTED Final   Acinetobacter baumannii NOT DETECTED NOT DETECTED Final   Enterobacteriaceae species NOT DETECTED NOT DETECTED Final   Enterobacter cloacae complex NOT DETECTED NOT DETECTED Final   Escherichia coli NOT DETECTED NOT DETECTED Final   Klebsiella oxytoca NOT DETECTED  NOT DETECTED Final   Klebsiella pneumoniae NOT DETECTED NOT DETECTED Final   Proteus species NOT DETECTED NOT DETECTED Final   Serratia marcescens NOT DETECTED NOT DETECTED Final   Haemophilus influenzae NOT DETECTED NOT DETECTED Final   Neisseria meningitidis NOT DETECTED NOT DETECTED Final   Pseudomonas aeruginosa NOT DETECTED NOT DETECTED Final   Candida albicans NOT DETECTED NOT DETECTED Final   Candida glabrata NOT DETECTED NOT DETECTED Final   Candida krusei NOT DETECTED NOT DETECTED Final   Candida parapsilosis NOT DETECTED NOT DETECTED Final   Candida tropicalis NOT DETECTED NOT DETECTED Final    Comment: Performed at [facility], [address] [place], [place] [address]  Urine culture     Status: Abnormal   Collection Time: 03/31/19 10:13 PM   Specimen: Urine, Random  Result Value Ref Range Status   Specimen Description   Final    URINE, RANDOM Performed at [facility], [address] [place], [place] [address]    Special Requests   Final    NONE Performed at [facility], [address] [place], [place] [address]    Culture MULTIPLE SPECIES PRESENT, SUGGEST RECOLLECTION (A)  Final   Report Status 04/02/2019 FINAL  Final  MRSA PCR Screening     Status: None   Collection Time: 04/01/19  1:54 AM   Specimen: Nasopharyngeal  Result Value Ref Range Status   MRSA by PCR NEGATIVE NEGATIVE Final     Comment:        The GeneXpert MRSA Assay (FDA approved for NASAL specimens only), is one component of a comprehensive MRSA colonization surveillance program. It is not intended to diagnose MRSA infection nor to guide or monitor treatment for MRSA infections. Performed at [facility], [address] [place], [place] [address]   CULTURE, BLOOD (ROUTINE X 2) w Reflex to ID Panel     Status: None   Collection Time: 04/02/19  3:05 PM   Specimen: BLOOD RIGHT WRIST  Result Value Ref Range Status   Specimen Description BLOOD RIGHT WRIST  Final   Special Requests NONE  Final   Culture   Final    NO GROWTH 5 DAYS Performed at [facility], [address] [place], [place] [address]    Report Status 04/07/2019 FINAL  Final  Culture, blood (Routine X 2) w Reflex to ID Panel     Status: None   Collection Time: 04/02/19 10:45 PM   Specimen: BLOOD  Result Value Ref Range Status   Specimen Description BLOOD RIGHT ASSIST CONTROL  Final   Special Requests   Final    BOTTLES DRAWN AEROBIC AND ANAEROBIC Blood Culture adequate volume   Culture   Final    NO GROWTH 5 DAYS Performed at [facility], [address] [place], [place] [address]    Report Status 04/07/2019 FINAL  Final  CULTURE, BLOOD (ROUTINE X 2) w Reflex to ID Panel     Status: None (Preliminary result)   Collection Time: 04/05/19  1:33 PM   Specimen: BLOOD RIGHT ARM  Result Value Ref Range Status   Specimen Description BLOOD RIGHT ARM  Final   Special Requests   Final    BOTTLES DRAWN AEROBIC AND ANAEROBIC Blood Culture results may not be optimal due to an excessive volume of blood received in culture bottles   Culture   Final    NO GROWTH 4 DAYS Performed at [facility], [address] [place], [place]  [address]    Report Status PENDING  Incomplete  CULTURE, BLOOD (ROUTINE X 2) w Reflex to ID Panel     Status: None (Preliminary result)   Collection Time: 04/05/19  1:42 PM    Specimen: BLOOD LEFT ARM  Result Value Ref Range Status   Specimen Description BLOOD LEFT ARM  Final   Special Requests   Final    BOTTLES DRAWN AEROBIC AND ANAEROBIC Blood Culture results may not be optimal due to an excessive volume of blood received in culture bottles   Culture   Final    NO GROWTH 4 DAYS Performed at [facility], [address] [place], [place] [address]    Report Status PENDING  Incomplete  MRSA PCR Screening     Status: None   Collection Time: 04/07/19  9:08 AM   Specimen: Nasopharyngeal  Result Value Ref Range Status   MRSA by PCR NEGATIVE NEGATIVE Final    Comment:        The GeneXpert MRSA Assay (FDA approved for NASAL specimens only), is one component of a comprehensive MRSA colonization surveillance program. It is not intended to diagnose MRSA infection nor to guide or monitor treatment for MRSA infections. Performed at [facility], [address], [place], [place] [address]      Studies: Dg Chest [facility] 1 View  Result Date: 04/08/2019 CLINICAL DATA:  Pneumonia EXAM: PORTABLE CHEST 1 VIEW COMPARISON:  Chest x-rays dated 04/07/2019 and 04/05/2019. FINDINGS: Continued airspace opacities throughout the RIGHT lung, without significant change. Patchy hazy opacities also persist within the LEFT perihilar and lower lung zones, unchanged. Probable RIGHT pleural effusion. No pneumothorax seen. Stable cardiomegaly. IMPRESSION: No significant interval change. Persistent multifocal/bilateral pneumonia. Electronically Signed   By: Franki Cabot M.D.   On: 04/08/2019 06:43    Scheduled Meds: . diazepam  5 mg Oral H8I  . folic acid  1 mg Intravenous Daily  . ipratropium-albuterol  3 mL Nebulization Q6H  . LORazepam      . mouth rinse  15 mL Mouth Rinse BID  . multivitamin with minerals  1 tablet Oral Daily  . niacin  500 mg Oral QHS  . pantoprazole (PROTONIX) IV  40 mg Intravenous Q12H  . sodium chloride flush  3 mL Intravenous Q12H  .  vitamin C  500 mg Oral BID   Continuous Infusions: . sodium chloride Stopped (04/06/19 0201)  . cefTRIAXone (ROCEPHIN)  IV 2 g (04/09/19 1037)  . potassium PHOSPHATE IVPB (in mmol)    . thiamine injection 500 mg (04/08/19 2016)  . vancomycin 1,500 mg (04/08/19 2250)    Assessment/Plan:  1. Acute respiratory failure with hypoxia from multifocal pneumonia-healthcare associated pneumonia.  Clinically improving.  Off BiPAP.  Off  high flow oxygen, wean off oxygen as tolerated on vancomycin and intensivist added Rocephin as patient was already on Unasyn, bronchodilator treatments as needed.  Discussed with intensivist. 2. Staph epidermidis bacteremia on vancomycin.  Echocardiogram negative.  Spoke with cardiology and they do not want to do a TEE.   infectious disease specialist and recommending  2 to 4 weeks of IV antibiotics.  Continue to watch temperature curve.  Follow-up with ID 3. Aspiration pneumonia.  Repeat chest x-ray showed worsening aspiration.  Infectious disease is following.  Appreciate their recommendations, will f/u with ID  4. Acute blood loss anemia with GI bleed.  Recent endoscopy with peptic ulcer disease and gastritis.  On Protonix. 5. Hypokalemia and hypophosphatemia.  Replete and recheck 6. Acute  rhabdomyolysis improved with IV fluids 7. Alcohol withdrawal with hallucinations on alcohol withdrawal protocol.  Nurse asking for some standing dose Ativan.  Ammonia level 18. 8. Hypertension on Norvasc  Plan discussed with intensivist  Code Status:     Code Status Orders  (From admission, onward)         Start     Ordered   03/31/19 2341  Full code  Continuous     03/31/19 2340        Code Status History    Date Active Date Inactive Code Status Order ID Comments User Context   12/09/2017 1336 12/12/2017 1845 Full Code [id number]  Dustin Flock, MD Inpatient   Advance Care Planning Activity     Family Communication: Spoke with the patient's friend Minda Ditto.  He  endorsed that patient does not have any family members who is willing to take care of him and also he refused to be his healthcare power of attorney as it is overwhelming .  Will follow up with social worker Disposition Plan: No plan at this point in time  Consultants:  Infectious disease  Antibiotics:  Vancomycin  Critical care time spent: 11minutes  [place]

## 2019-04-09 NOTE — Consult Note (Signed)
PHARMACY CONSULT NOTE - FOLLOW UP  Pharmacy Consult for Electrolyte Monitoring and Replacement   Recent Labs: Potassium (mmol/L)  Date Value  04/09/2019 3.3 (L)   Magnesium (mg/dL)  Date Value  04/09/2019 2.0   Calcium (mg/dL)  Date Value  04/09/2019 7.1 (L)   Albumin (g/dL)  Date Value  04/07/2019 2.0 (L)   Phosphorus (mg/dL)  Date Value  04/09/2019 <1.0 (LL)   Sodium (mmol/L)  Date Value  04/09/2019 138   Corrected Calcium 8.7  Assessment: RQ is 62 YOM admitted for pneumonia and GI bleed on 03/31/19. Past medical history for alcohol abuse and patient is currently under CIWA protocol.  -IBW 70.7  KPhos 9mmol IV given at 0543 on 6/28  Goal of Therapy:  Potassium ~4 Magnesium ~ 2 Phosphorus ~2.5  Plan: Will order KPhos 13mmol IV x 1 dose. (expect ~21mEq potassium replensihment)  Will recheck electrolytes at 1800.   Pharmacy will continue to monitor and adjust accordingly.  Marisa Cyphers, PharmD Candidate,04/09/2019 8:37 AM

## 2019-04-09 NOTE — Progress Notes (Signed)
SLP Cancellation Note  Patient Details Name: George Wade MRN: [id number] DOB: 08/01/1956   Cancelled treatment:       Reason Eval/Treat Not Completed: Patient's level of consciousness;Patient not medically ready. NSG reported pt did well eating/drinking at breakfast meal this morning. However, he just recently became agitated w/ increased respiratory presentation requiring medication to calm. He is currently too drowsy for tx. ST services will f/u tomorrow. NSG agreed.     Orinda Kenner, MS, CCC-SLP [place] 04/09/2019, 11:40 AM

## 2019-04-09 NOTE — Progress Notes (Signed)
ID Pt now in ICU Was transferred over the weekend because of resp distress,  On going Aspiration  Was also encephalopathic and cefepime and flagyl were discontinued by intensivist as risk for encephalopathy. He is now on ceftriaxone and vanco is being continued for staph epi bacteremia   Patient Vitals for the past 24 hrs:  BP Temp Temp src Pulse Resp SpO2  04/09/19 1200 105/81 99 F (37.2 C) Axillary 96 (!) 27 97 %  04/09/19 0848 - - - 85 (!) 21 -  04/09/19 0600 (!) 122/93 - - 99 (!) 24 96 %  04/09/19 0500 - - - 99 19 (!) 89 %  04/09/19 0400 138/88 - - (!) 102 (!) 27 92 %  04/09/19 0300 - - - 92 (!) 26 96 %  04/09/19 0253 - - - - - 97 %  04/09/19 0200 126/77 98.5 F (36.9 C) Oral 95 (!) 25 91 %  04/09/19 0103 131/86 - - - 15 90 %  04/09/19 0100 127/78 - - - (!) 26 90 %  04/09/19 0000 - - - - (!) 24 -  04/08/19 2300 - - - - (!) 24 93 %  04/08/19 2200 105/70 - - - (!) 23 93 %  04/08/19 2106 107/74 - - 93 20 92 %  04/08/19 2100 - - - 96 20 93 %  04/08/19 2032 - - - - - 97 %  04/08/19 2000 - 97.9 F (36.6 C) Oral 89 (!) 22 94 %  04/08/19 1920 93/71 - - 91 (!) 25 93 %  04/08/19 1900 - - - 88 (!) 21 96 %  04/08/19 1700 (!) 89/68 - - 89 (!) 23 97 %   Fluctuating mental status Nasal cannula     Impression/recommendation  63 y.o.malewith a history ofalcohol abuse, hypertension, hypercholesterolemia, 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 ?  Multi lobar pneumonia worse on the Rt side due to ongoing aspiration- was on unasyn- changed to cefepime and flagyl on 6/26 for worsening leucocytosis and new fevers , which improved but because of encephalopathy they were Dc and started ceftriaxone on 6/27 by intensivist. observe on ceftriaxone  Pt  had scc of tonsils ( rt ) and had radiation- could he have swallowing problems  because of scar tissue? Or could there be recurrence ? ENT evaluation is recommended  Encephalopathysecondary to alcohol abuse ( wernicke's /korsakoff's psychosis and withdrawal syndrome-fluctuating   Staph epidermidis bacteremia- 3/4 bottle so a true pathogen- likely source could be the skin abrasion on both knees. He has B/l TKA - hence watch closely for infection On vancomycin- 2 d echo done- ideally will need TEE but not a candidate because of resp status- will treat for 4 weeks until 04/28/27  Acute GI bleed- has a h/o erosive esophagitis- and gastric ulcer- on PPI  Anemia-due to above- received blood transfusion  Fall secondary to all of the above- rhabdomyolysis  ?hypokalemia and phophatemia - being corrected  Abnormal LFTS- AST>ALT- improving  Discussed with his nurse

## 2019-04-09 NOTE — Consult Note (Signed)
PHARMACY CONSULT NOTE - FOLLOW UP  Pharmacy Consult for Electrolyte Monitoring and Replacement   Recent Labs: Potassium (mmol/L)  Date Value  04/09/2019 3.4 (L)   Magnesium (mg/dL)  Date Value  04/09/2019 2.0   Calcium (mg/dL)  Date Value  04/09/2019 7.1 (L)   Albumin (g/dL)  Date Value  04/07/2019 2.0 (L)   Phosphorus (mg/dL)  Date Value  04/09/2019 2.2 (L)   Sodium (mmol/L)  Date Value  04/09/2019 138   Corrected Calcium 8.7  Assessment: RQ is 62 YOM admitted for pneumonia and GI bleed on 03/31/19. Past medical history for alcohol abuse and patient is currently under CIWA protocol.  -IBW 70.7  KPhos 32mmol IV given at 0543 on 6/28  Goal of Therapy:  Potassium ~4 Magnesium ~ 2 Phosphorus ~2.5  Plan: Will replace w/ Kphos neutral 500 mg PO q1h x 4 and will recheck w/ am labs.  Pharmacy will continue to monitor and adjust accordingly.  Tobie Lords, PharmD, BCPS Clinical Pharmacist 04/09/2019

## 2019-04-09 NOTE — Progress Notes (Signed)
Pharmacy Antibiotic Note  George Wade is a 63 y.o. male admitted on 03/31/2019 with aspiration pneumonia and now with CoNS bacteremia.  Pharmacy has been consulted for  vancomycin dosing. Since admission his Scr is back to baseline and leukocytosis has resolved but he remains encephalopathic with 3/4 positive blood cultures. - likely source could be the skin abrasion on both knees since he has B/L TKA. ID will evaluate later to see whether he needs TEE - Cards deemed unsafe.  Plan: Will continue Vancomycin 1500 mg IV Q 12 h  Current plan is to continue patient on vancomycin for 4 weeks of IV antibiotics per MD notes.  Will consider vancomycin levels for 06/30.   Patient continues on ceftriaxone 2g IV Q24hr for possible pneumonia.   Pharmacy will adjust and monitor accordingly.  Height: 5\' 9"  (175.3 cm) Weight: 184 lb 11.9 oz (83.8 kg) IBW/kg (Calculated) : 70.7  Temp (24hrs), Avg:98.5 F (36.9 C), Min:97.9 F (36.6 C), Max:99 F (37.2 C)  Recent Labs  Lab 04/03/19 1321 04/04/19 0307  04/05/19 0424 04/06/19 0742 04/07/19 0458 04/07/19 1720 04/08/19 0315 04/09/19 0414  WBC  --  11.5*  --  16.5*  --  12.7* 11.7*  --  16.0*  CREATININE  --  0.47*  --  0.39* 0.42* 0.47*  --  0.43* 0.37*  VANCOTROUGH 33*  --   --   --   --   --   --   --   --   VANCOPEAK  --   --    < > 14*  --   --   --  13*  --    < > = values in this interval not displayed.    Estimated Creatinine Clearance: 95.7 mL/min (A) (by C-G formula based on SCr of 0.37 mg/dL (L)).    Microbiology 6/20 BCx 3/4 S epidermidis 6/21 MRSA PCR (-) 6/20 UCx recollect 6/20 SARS CoV-2 (-)  Antibiotics this Admission Unasyn 6/21 >>6/26 Vancomycin 6/22 >> Ceftriaxone 6/27 >>   Thank you for allowing pharmacy to be a part of this patient's care.  Marisa Cyphers, PharmD Candidate 04/09/2019 2:29 PM

## 2019-04-10 LAB — VANCOMYCIN, PEAK: Vancomycin Pk: 23 ug/mL — ABNORMAL LOW (ref 30–40)

## 2019-04-10 LAB — RENAL FUNCTION PANEL
Albumin: 2 g/dL — ABNORMAL LOW (ref 3.5–5.0)
Anion gap: 7 (ref 5–15)
BUN: 6 mg/dL — ABNORMAL LOW (ref 8–23)
CO2: 23 mmol/L (ref 22–32)
Calcium: 7 mg/dL — ABNORMAL LOW (ref 8.9–10.3)
Chloride: 114 mmol/L — ABNORMAL HIGH (ref 98–111)
Creatinine, Ser: 0.5 mg/dL — ABNORMAL LOW (ref 0.61–1.24)
GFR calc Af Amer: >60 mL/min (ref >60)
GFR calc non Af Amer: >60 mL/min (ref >60)
Glucose, Bld: 154 mg/dL — ABNORMAL HIGH (ref 70–99)
Phosphorus: 2.2 mg/dL — ABNORMAL LOW (ref 2.5–4.6)
Potassium: 3.6 mmol/L (ref 3.5–5.1)
Sodium: 144 mmol/L (ref 135–145)

## 2019-04-10 LAB — CULTURE, BLOOD (ROUTINE X 2)
Culture: NO GROWTH
Culture: NO GROWTH

## 2019-04-10 LAB — VANCOMYCIN, TROUGH: Vancomycin Tr: 14 ug/mL — ABNORMAL LOW (ref 15–20)

## 2019-04-10 LAB — PROCALCITONIN: Procalcitonin: 0.86 ng/mL

## 2019-04-10 MED ORDER — POTASSIUM CHLORIDE CRYS ER 20 MEQ PO TBCR
40.0000 meq | EXTENDED_RELEASE_TABLET | Freq: Once | ORAL | Status: AC
  Administered 2019-04-10: 40 meq via ORAL
  Filled 2019-04-10: qty 2

## 2019-04-10 MED ORDER — POTASSIUM PHOSPHATES 15 MMOLE/5ML IV SOLN: 20.0000 mmol | Freq: Once | INTRAVENOUS | Status: DC

## 2019-04-10 MED ORDER — CLONIDINE HCL 0.1 MG PO TABS
0.1000 mg | ORAL_TABLET | Freq: Three times a day (TID) | ORAL | Status: DC
  Administered 2019-04-10 – 2019-04-16 (×18): 0.1 mg via ORAL
  Filled 2019-04-10 (×19): qty 1

## 2019-04-10 NOTE — Progress Notes (Addendum)
Speech Language Pathology Treatment: Dysphagia  Patient Details Name: George Wade MRN: [id number] DOB: 17-Jul-1956 Today's Date: 04/10/2019 Time: 0820-0900 SLP Time Calculation (min) (ACUTE ONLY): 40 min  Assessment / Plan / Recommendation Clinical Impression  Pt seen for ongoing assessment of toleration of diet now that pt's medical and mental status' have improved, and pt has returned to an oral diet(regular diet ordered by MD ~2 days ago). Pt is more alert, calm w/ only intermittent episodes of confusion, agitation. He does remain on Precedex d/t Encephalopathy likely alcoholic in etiology(chronic ETOH use per chart notes). He has had coffee ground emesis which has resolved; assessed by GI in past. Now being followed by Dietician d/t Electrolyte imbalance. Pt is on a PPI. Pt was eating his breakfast meal. He was educated on general aspiration precautions to include need for reducing risk for aspiration by using Single, Small sips via cup/straw vs fast, gulping drinking behavior. Pt fed self practicing these aspiration precautions w/ min verbal/visual cues by SLP. He appeared to adequately tolerate trials of thin liquids via both cup/straw w/ no immediate, overt s/s of aspiration noted - rest breaks and small, single sips were utilized during drinking as precautions to lessen any SOB/WOB(pt is mildy dysphonic when talking). Pt demonstrated a clear vocal quality b/t trials; no decline in respiratory status or O2 sats from his baseline during/post trials. Pt consumed trials of soft solid foods w/ no overt s/s of aspiration noted as well. Encouraged small bites for easier mastication and conservation of energy. Pt was able to state back the precautions and describe how to eat/drink to reduce risk for aspiration. Worked w/ pt on education on aspiration precautions and need to monitor respiratory status to take rest breaks when needed to avoid WOB/SOB w/ oral intake the remainder of session.  Recommend a  more mech soft diet consistency for ease of mastication and self-feeding; general aspiration precautions; Pills in puree for safer swallowing at this time. Pt is on a PPI. Pt needs tray setup and positioning; monitoring for follow through w/ precautions during meals and support. Recommend reducing distractions during meals. Precautions posted in room. NSG to reconsult ST services if any decline in status while admitted. MD/NSG updated and agreed.     HPI HPI: Pt is a 63 y.o. male who has a history of daily alcohol drinking and alcohol withdrawal, seizure in past who states he has been having diarrhea for the last month and dark vomit since yesterday. 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  This admission, he was brought to the emergency room by EMS services after being found outside his home.  Patient reports having laid in the rain for approximately 4 hours prior to EMS services being called by his neighbor.  He reports becoming progressively weak over the last month with diarrhea as well as nausea and vomiting over the last 2 days.  Pt seemed more congested since his arrival. Coughing and then spits out rust colored sputum.  On review of patient's prior records, he has a history of GI bleed with EGD performed at [facility] on 09/29/2018 finding severe ulcerating esophagitis of the entire mid and distal esophagus. There was no evidence of varices or active esophageal bleeding.  Currently, GI is following but unable to do endoscopy at this time secondary to pt's illness/status.  Pt has been more somnolent per MD notes. Pt was transferred to CCU d/t mental status and medical status. He remains on precedex for agitation and is monitored carefully. He appears to be improving overall per MD report.  Pt is now on a regular diet per MD order post being made NPO d/t the decline in his status briefly.       SLP Plan  All goals met       Recommendations  Diet recommendations: Dysphagia 3 (mechanical soft);Thin liquid(easier intake overall) Liquids provided via: Cup;Straw Medication Administration: Whole meds with puree(for safer swallowing ) Supervision: Patient able to self feed;Intermittent supervision to cue for compensatory strategies(tray setup) Compensations: Minimize environmental distractions;Slow rate;Small sips/bites;Lingual sweep for clearance of pocketing;Follow solids with liquid Postural Changes and/or Swallow Maneuvers: Seated upright 90 degrees;Upright 30-60 min after meal                General recommendations: (Dietician f/u for support) Oral Care Recommendations: Oral care BID;Staff/trained caregiver to provide oral care Follow up Recommendations: Skilled Nursing facility(TBD) SLP Visit Diagnosis: Dysphagia, oral phase (R13.11)(impacted by overall weakness) Plan: All goals met       GO                 Orinda Kenner, MS, CCC-SLP Watson,Katherine 04/10/2019, 1:22 PM

## 2019-04-10 NOTE — Progress Notes (Signed)
Pharmacy Antibiotic Note  George Wade is a 63 y.o. male admitted on 03/31/2019 with aspiration pneumonia and now with MRSE bacteremia.  Pharmacy has been consulted for  vancomycin dosing. Since admission his Scr is back to baseline and leukocytosis has resolved but he remains encephalopathic with 3/4 positive blood cultures. - likely source could be the skin abrasion on both knees since he has B/L TKA. Ideally needs TEE but not a candidate.    Today, 04/10/2019 Day #9 vancomycin Day #5 cefepime to ceftriaxone - SCr low, up for recent values, unsure of clinical significance, good UOP - afebrile - No WBC today - O2 needs appear improved  Plan: - continue Vancomycin 1500 mg IV Q 12 h.  Plan for vancomycin levels today (prior to 5th dose) vanco 1500mg  IV q12h dose at 10:00 vanco peak at 13: 30 =  vanco trough at 21:30 =   Current plan is to continue patient on vancomycin for 4 weeks of IV antibiotics per MD notes (until 7/18).  Patient continues on ceftriaxone 2g IV Q24hr for possible pneumonia - f/u length of therapy  Pharmacy will adjust and monitor accordingly.  Height: 5\' 9"  (175.3 cm) Weight: 184 lb 11.9 oz (83.8 kg) IBW/kg (Calculated) : 70.7  Temp (24hrs), Avg:98.7 F (37.1 C), Min:97.9 F (36.6 C), Max:99.3 F (37.4 C)  Recent Labs  Lab 04/03/19 1321  04/04/19 0307  04/05/19 0424 04/06/19 0742 04/07/19 0458 04/07/19 1720 04/08/19 0315 04/09/19 0414 04/10/19 0513  WBC  --   --  11.5*  --  16.5*  --  12.7* 11.7*  --  16.0*  --   CREATININE  --    < > 0.47*  --  0.39* 0.42* 0.47*  --  0.43* 0.37* 0.50*  VANCOTROUGH 33*  --   --   --   --   --   --   --   --   --   --   VANCOPEAK  --   --   --    < > 14*  --   --   --  13*  --   --    < > = values in this interval not displayed.    Estimated Creatinine Clearance: 95.7 mL/min (A) (by C-G formula based on SCr of 0.5 mg/dL (L)).    Microbiology 6/20 BCx 3/4 methicillin-resistant S epidermidis 6/21 MRSA PCR (-) 6/20  UCx" Mx spp 6/20 SARS CoV-2 (-) 6/22 Bcx: NG 6/25 Bcx: NG  Antibiotics this Admission Unasyn 6/21 >>6/26 Vancomycin 6/22 >> Ceftriaxone 6/27 >>   Thank you for allowing pharmacy to be a part of this patient's care.  Doreene Eland, PharmD, BCPS.   Work Cell: [phone] 04/10/2019 9:17 AM

## 2019-04-10 NOTE — Progress Notes (Signed)
Pharmacy Antibiotic Note  George Wade is a 63 y.o. male admitted on 03/31/2019 with aspiration pneumonia and now with MRSE bacteremia.  Pharmacy has been consulted for  vancomycin dosing. Since admission his Scr is back to baseline and leukocytosis has resolved but he remains encephalopathic with 3/4 positive blood cultures. - likely source could be the skin abrasion on both knees since he has B/L TKA. Ideally needs TEE but not a candidate.    Today, 04/10/2019 Day #9 vancomycin Day #5 cefepime to ceftriaxone - SCr low, up for recent values, unsure of clinical significance, good UOP - afebrile - No WBC today - O2 needs appear improved  Plan: - continue Vancomycin 1500 mg IV Q 12 h.  Plan for vancomycin levels today (prior to 5th dose) vanco 1500mg  IV q12h dose at 10:00 vanco peak at 14:31 = 23 mcg/mL  vanco trough at 21:30 = 14 mcg/mL   Calculated AUC = 476 Will continue pt on current dose.   Current plan is to continue patient on vancomycin for 4 weeks of IV antibiotics per MD notes (until 7/18).  Patient continues on ceftriaxone 2g IV Q24hr for possible pneumonia - f/u length of therapy  Pharmacy will adjust and monitor accordingly.  Height: 5\' 9"  (175.3 cm) Weight: 184 lb 11.9 oz (83.8 kg) IBW/kg (Calculated) : 70.7  Temp (24hrs), Avg:98.9 F (37.2 C), Min:97.9 F (36.6 C), Max:99.3 F (37.4 C)  Recent Labs  Lab 04/04/19 0307  04/05/19 0424 04/06/19 0742 04/07/19 0458 04/07/19 1720 04/08/19 0315 04/09/19 0414 04/10/19 0513 04/10/19 1431 04/10/19 2113  WBC 11.5*  --  16.5*  --  12.7* 11.7*  --  16.0*  --   --   --   CREATININE 0.47*  --  0.39* 0.42* 0.47*  --  0.43* 0.37* 0.50*  --   --   VANCOTROUGH  --   --   --   --   --   --   --   --   --   --  14*  VANCOPEAK  --    < > 14*  --   --   --  13*  --   --  23*  --    < > = values in this interval not displayed.    Estimated Creatinine Clearance: 95.7 mL/min (A) (by C-G formula based on SCr of 0.5 mg/dL (L)).     Microbiology 6/20 BCx 3/4 methicillin-resistant S epidermidis 6/21 MRSA PCR (-) 6/20 UCx" Mx spp 6/20 SARS CoV-2 (-) 6/22 Bcx: NG 6/25 Bcx: NG  Antibiotics this Admission Unasyn 6/21 >>6/26 Vancomycin 6/22 >> Ceftriaxone 6/27 >>   Thank you for allowing pharmacy to be a part of this patient's care.  Doreene Eland, PharmD, BCPS.   Work Cell: [phone] 04/10/2019 10:17 PM

## 2019-04-10 NOTE — Progress Notes (Signed)
CRITICAL CARE NOTE         SUBJECTIVE FINDINGS & SIGNIFICANT EVENTS   63 year old chronic alcoholic, admitted with encephalopathy likely alcoholic in etiology as well as coffee-ground emesis which has resolved now..  Had septic work-up which has revealed repeat blood cultures negative, previously + for staph epi bacteremia, and cxr with right sided infiltrate suggestive of aspiration pneumonia. - ID on case appreciate collaboration -currently on Rocephin only.   -withdrawal symptoms improved continues to require low dose Precedex gtt, still confabulates with confusion,  H/H borderline low, status post gastroenterology evaluation.    -Electrolyte imbalance with possible refeeding syndrome, dietitian and clinical pharmacologist evaluation in progress     PAST MEDICAL HISTORY   Past Medical History:  Diagnosis Date  . ED (erectile dysfunction)   . Hypercholesteremia   . Hypertension   . Lung cancer [facility])      SURGICAL HISTORY   Past Surgical History:  Procedure Laterality Date  . HERNIA REPAIR  1958  . REPLACEMENT TOTAL KNEE BILATERAL  2009  . TONSILLECTOMY  2008     FAMILY HISTORY   Family History  Problem Relation Age of Onset  . Cancer Mother   . Hypertension Father   . Cancer Paternal Grandmother      SOCIAL HISTORY   Social History   Tobacco Use  . Smoking status: Never Smoker  . Smokeless tobacco: Never Used  Substance Use Topics  . Alcohol use: No    Frequency: Never    Comment: Quit 2 days ago  . Drug use: No     MEDICATIONS   Current Medication:  Current Facility-Administered Medications:  .  0.9 %  sodium chloride infusion, , Intravenous, PRN, Mayo, Pete Pelt, MD, Stopped at 04/06/19 0201 .  acetaminophen (TYLENOL) tablet 650 mg, 650 mg, Oral, Q6H PRN, Mayo, Pete Pelt, MD, 650 mg at 04/08/19 1515 .  calcium carbonate (TUMS - dosed in mg elemental calcium) chewable tablet 400 mg of elemental calcium, 2 tablet, Oral, Q8H PRN, Mayo, Pete Pelt, MD .  cefTRIAXone (ROCEPHIN) 2 g in sodium chloride 0.9 % 100 mL IVPB, 2 g, Intravenous, Q24H, Tyler Pita, MD, Last Rate: 200 mL/hr at 04/09/19 1037, 2 g at 04/09/19 1037 .  dexmedetomidine (PRECEDEX) 400 MCG/100ML (4 mcg/mL) infusion, 0.2-0.6 mcg/kg/hr, Intravenous, Titrated, Charlett Nose, RPH, Last Rate: 8.38 mL/hr at 04/09/19 2035, 0.4 mcg/kg/hr at 04/09/19 2035 .  diazepam (VALIUM) tablet 5 mg, 5 mg, Oral, Q6H, Mickal Meno, MD, 5 mg at 04/10/19 0526 .  folic acid injection 1 mg, 1 mg, Intravenous, Daily, Shanlever, Pierce Crane, RPH, 1 mg at 04/09/19 1030 .  guaiFENesin-dextromethorphan (ROBITUSSIN DM) 100-10 MG/5ML syrup 5 mL, 5 mL, Oral, Q4H PRN, Tyler Pita, MD, 5 mL at 04/08/19 2112 .  haloperidol lactate (HALDOL) injection 1 mg, 1 mg, Intravenous, Q6H PRN, Tyler Pita, MD, 1 mg at 04/07/19 2313 .  ipratropium-albuterol (DUONEB) 0.5-2.5 (3) MG/3ML nebulizer solution 3 mL, 3 mL, Nebulization, Q4H PRN, Lanney Gins, Astra Gregg, MD .  LORazepam (ATIVAN) injection 1 mg, 1 mg, Intravenous, Q2H PRN, Ottie Glazier, MD, 1 mg at 04/09/19 1351 .  MEDLINE mouth rinse, 15 mL, Mouth Rinse, BID, Wilhelmina Mcardle, MD, 15 mL at 04/09/19 2151 .  multivitamin with minerals tablet 1 tablet, 1 tablet, Oral, Daily, Mayo, Pete Pelt, MD, 1 tablet at 04/09/19 1100 .  niacin tablet 500 mg, 500 mg, Oral, QHS, Tyler Pita, MD, 500 mg at 04/09/19 2155 .  [DISCONTINUED]  ondansetron (ZOFRAN) tablet 4 mg, 4 mg, Oral, Q6H PRN **OR** ondansetron (ZOFRAN) injection 4 mg, 4 mg, Intravenous, Q6H PRN, Seals, Angela H, NP, 4 mg at 04/04/19 0249 .  pantoprazole (PROTONIX) injection 40 mg, 40 mg, Intravenous, Q12H, Loletha Grayer, MD, 40 mg at 04/09/19 2151 .  sodium chloride flush (NS) 0.9 % injection 10-40 mL, 10-40 mL,  Intracatheter, PRN, Wieting, Wyman, MD .  sodium chloride flush (NS) 0.9 % injection 3 mL, 3 mL, Intravenous, Q12H, Seals, Angela H, NP, 3 mL at 04/09/19 2152 .  thiamine 500mg  in normal saline (45ml) IVPB, 500 mg, Intravenous, Q24H, Tyler Pita, MD, Last Rate: 100 mL/hr at 04/09/19 2021, 500 mg at 04/09/19 2021 .  vancomycin (VANCOCIN) 1,500 mg in sodium chloride 0.9 % 500 mL IVPB, 1,500 mg, Intravenous, Q12H, Hallaji, Sheema M, RPH, Last Rate: 250 mL/hr at 04/09/19 2205, 1,500 mg at 04/09/19 2205 .  vitamin C (ASCORBIC ACID) tablet 500 mg, 500 mg, Oral, BID, Leslye Peer, Kinney, MD, 500 mg at 04/09/19 2153    ALLERGIES   Patient has no known allergies.    REVIEW OF SYSTEMS    10 point ROS conducted and is negative except as per subjective findings.  Patient reports having doctors appointment in the community which he wants to rush out to go see, as well as a missing VCR.  PHYSICAL EXAMINATION   Vitals:   04/10/19 0500 04/10/19 0600  BP:  (!) 89/65  Pulse: 87 70  Resp: (!) 27 (!) 25  Temp:    SpO2: (!) 85% 91%    GENERAL: Anxious appearing HEAD: Normocephalic, atraumatic.  EYES: Pupils equal, round, reactive to light.  No scleral icterus.  MOUTH: Moist mucosal membrane. NECK: Supple. No thyromegaly. No nodules. No JVD.  PULMONARY: Rhonchorous breath sounds worse on the right CARDIOVASCULAR: S1 and S2. Regular rate and rhythm. No murmurs, rubs, or gallops.  GASTROINTESTINAL: Soft, nontender, non-distended. No masses. Positive bowel sounds. No hepatosplenomegaly.  MUSCULOSKELETAL: No swelling, clubbing, or edema.  NEUROLOGIC: Mild distress due to acute illness SKIN:intact,warm,dry   LABS AND IMAGING     LAB RESULTS: Recent Labs  Lab 04/08/19 0315 04/09/19 0414 04/09/19 2153 04/10/19 0513  NA 140 138  --  144  K 3.6 3.3* 3.4* 3.6  CL 108 106  --  114*  CO2 24 23  --  23  BUN 10 8  --  6*  CREATININE 0.43* 0.37*  --  0.50*  GLUCOSE 94 144*  --  154*    Recent Labs  Lab 04/07/19 0458 04/07/19 1720 04/09/19 0414  HGB 7.1* 7.8* 7.5*  HCT 25.1* 29.1* 26.9*  WBC 12.7* 11.7* 16.0*  PLT 282 277 313     IMAGING RESULTS: No results found.    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   Refeeding syndrome  -Phos less than 1 -Multiple electrolyte abnormalities -Appreciate dietitian pharmacology evaluation  Acute blood loss anemia Due to upper GI bleed with coffee-ground emesis -On Protonix IV -Status post GI evaluation-appreciate input    Staph epidermidis bacteremia  -Multiple blood cultures positive -Continue with current IV antibiotics and vancomycin appreciate pharmacy -follow up cultures -consider stress dose steroids   ID -continue  IV abx as prescibed -follow up cultures  GI/Nutrition GI PROPHYLAXIS as indicated DIET-->TF's as tolerated Constipation protocol as indicated  ENDO - ICU hypoglycemic\Hyperglycemia protocol -check FSBS per protocol   ELECTROLYTES -follow labs as needed -replace as needed -pharmacy consultation   DVT/GI PRX ordered -SCDs  TRANSFUSIONS AS NEEDED MONITOR FSBS ASSESS the need for LABS as needed   Critical care provider statement:    Critical care time (minutes):  32   Critical care time was exclusive of:  Separately billable procedures and treating other patients   Critical care was necessary to treat or prevent imminent or life-threatening deterioration of the following conditions:   Acute hypoxemic respiratory failure due to aspiration pneumonia, alcoholic encephalopathy, multiple metabolic  derangements, dysphagia, coagulation negative staph bacteremia, transaminitis, upper GI bleeding due to erosive esophagitis, multiple comorbid conditions   Critical care was time spent personally by me on the following activities:  Development of treatment plan with patient or surrogate, discussions with consultants, evaluation of patient's response to treatment, examination of patient, obtaining history from patient or surrogate, ordering and performing treatments and interventions, ordering and review of laboratory studies and re-evaluation of patient's condition.  I assumed direction of critical care for this patient from another provider in my specialty: no    This document was prepared using Dragon voice recognition software and may include unintentional dictation errors.    Ottie Glazier, M.D.  Division of [place]

## 2019-04-10 NOTE — Progress Notes (Signed)
Nutrition Follow-up  RD working remotely.  DOCUMENTATION CODES:   Not applicable  INTERVENTION:  Provide Ensure Enlive po BID, each supplement provides 350 kcal and 20 grams of protein.  NUTRITION DIAGNOSIS:   Inadequate oral intake related to acute illness(GIB) as evidenced by NPO status.  Resolving - diet advanced.  GOAL:   Patient will meet greater than or equal to 90% of their needs  Progressing.  MONITOR:   PO intake, Supplement acceptance, Labs, Weight trends, Skin, I & O's  REASON FOR ASSESSMENT:   Malnutrition Screening Tool    ASSESSMENT:   63 y.o. 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 Pt found to have aspiration PNA and GIB  Patient was previously on dysphagia 1 diet with nectar-thick liquids. He was then made NPO. He is now on heart healthy diet. PO intake is variable. He ate 75-100% of meals on 6/28 but then on 6/29 ate 0-30% of meals. He became drowsy from medication yesterday so was unable to have SLP follow-up. Patient is on high-dose IV thiamine and is also receiving folic acid, niacin, and vitamin C supplementation. He has been refeeding and electrolytes are being monitored by pharmacy.  Medications reviewed and include: folic acid 1 mg daily IV, MVI daily, niacin 500 mg QHS, pantoprazole, vitamin C 500 mg BID, ceftriaxone, thiamine 500 mg IV daily, vancomycin.  Labs reviewed: Chloride 114, BUN 6, Creatinine 0.5, Phosphorus 2.2.  Diet Order:   Diet Order            Diet Heart Room service appropriate? Yes; Fluid consistency: Thin  Diet effective now             EDUCATION NEEDS:   No education needs have been identified at this  time  Skin:  Skin Assessment: Skin Integrity Issues:(MSAD to groin, scrotum, perineum)  Last BM:  04/09/2019 - medium type 6  Height:   Ht Readings from Last 1 Encounters:  03/31/19 5\' 9"  (1.753 m)   Weight:   Wt Readings from Last 1 Encounters:  04/08/19 83.8 kg   Ideal Body Weight:  72.7 kg  BMI:  Body mass index is 27.28 kg/m.  Estimated Nutritional Needs:   Kcal:  1900-2200kcal/day  Protein:  95-110g/day  Fluid:  >2.1L/day  George Blade, MS, RD, LDN Office: [phone] Pager: [phone] After Hours/Weekend Pager: [phone]

## 2019-04-10 NOTE — Progress Notes (Signed)
[name], male   DOB: 02/07/1956, 63 y.o.   MRN: [id number]  [employer] PROGRESS NOTE  Dewight Catino MLY:[id number] DOB: June 15, 1956 DOA: 03/31/2019 PCP: Patient, [name] Per  HPI/Subjective: Patient is on precedex gtt , intermittent episodes of  pleasant confusion  , off BiPAP high flow oxygen currently on 4 L of oxygen feels much better awake and alert Objective: Vitals:   04/10/19 1100 04/10/19 1206  BP:  116/81  Pulse: 72   Resp: (!) 25   Temp:    SpO2: 98%     Filed Weights   03/31/19 1912 04/07/19 0906 04/08/19 0435  Weight: 78 kg 82.3 kg 83.8 kg    ROS: Review of Systems  Constitutional: Negative for chills and fever.  HENT: Negative for nosebleeds and tinnitus.   Eyes: Negative for blurred vision.  Respiratory: Positive for cough. Negative for shortness of breath.   Cardiovascular: Negative for palpitations.  Gastrointestinal: Negative for abdominal pain.  Genitourinary: Negative for dysuria and frequency.  Skin: Negative for itching.  Neurological: Negative for dizziness, tremors and weakness.  Psychiatric/Behavioral: Negative for suicidal ideas. The patient is not nervous/anxious.    Exam: Physical Exam  HENT:  Nose: No mucosal edema.  Mouth/Throat: No oropharyngeal exudate or posterior oropharyngeal edema.  Eyes: Pupils are equal, round, and reactive to light. Conjunctivae, EOM and lids are normal.  Neck: No JVD present. Carotid bruit is not present. No edema present. No thyroid mass and no thyromegaly present.  Cardiovascular: S1 normal and S2 normal. Exam reveals no gallop.  No murmur heard. Pulses:      Dorsalis pedis pulses are 2+ on the right side and 2+ on the left side.  Respiratory: No respiratory distress. He has decreased breath sounds in the right lower field and the left lower field. He has no wheezes. He has rhonchi in the right lower field and the left lower field. He has no rales.  Positive crackles bilaterally, diminished  breath sounds  GI: Soft. Bowel sounds are normal. There is no abdominal tenderness.  Musculoskeletal:     Right ankle: He exhibits swelling.     Left ankle: He exhibits swelling.  Lymphadenopathy:    He has no cervical adenopathy.  Neurological: He is alert.  Skin: Skin is warm. Nails show no clubbing.  Skin abrasions bilateral knees.  Psychiatric: His affect is blunt.      Data Reviewed: Basic Metabolic Panel: Recent Labs  Lab 04/04/19 1754  04/05/19 0424 04/06/19 8127 04/07/19 5170 04/08/19 0315 04/09/19 0414 04/09/19 2153 04/10/19 0513  NA  --   --  137 139 139 140 138  --  144  K 3.4*  --  3.7 3.4* 3.7 3.6 3.3* 3.4* 3.6  CL  --   --  104 105 110 108 106  --  114*  CO2  --   --  24 24 21* 24 23  --  23  GLUCOSE  --   --  110* 103* 97 94 144*  --  154*  BUN  --   --  <5* 7* 9 10 8   --  6*  CREATININE  --   --  0.39* 0.42* 0.47* 0.43* 0.37*  --  0.50*  CALCIUM  --   --  7.9* 7.5* 6.9* 7.0* 7.1*  --  7.0*  MG 1.9  --  1.9  --  2.0 2.3 2.0  --   --   PHOS  --    < > 1.3* 1.5*  2.2* 1.6* <1.0* 2.2* 2.2*   < > = values in this interval not displayed.   Liver Function Tests: Recent Labs  Lab 04/04/19 0307 04/07/19 0458 04/10/19 0513  AST 201* 80*  --   ALT 125* 76*  --   ALKPHOS 201* 213*  --   BILITOT 4.1* 3.6*  --   PROT 5.1* 5.0*  --   ALBUMIN 2.3* 2.0* 2.0*   CBC: Recent Labs  Lab 04/04/19 0307 04/05/19 0424 04/07/19 0458 04/07/19 1720 04/09/19 0414  WBC 11.5* 16.5* 12.7* 11.7* 16.0*  HGB 7.9* 8.4* 7.1* 7.8* 7.5*  HCT 27.9* 29.9* 25.1* 29.1* 26.9*  MCV 79.0* 80.8 80.4 83.6 80.1  PLT 152 206 282 277 313   Cardiac Enzymes: No results for input(s): CKTOTAL, CKMB, CKMBINDEX, TROPONINI in the last 168 hours.  CBG: Recent Labs  Lab 04/07/19 0901  GLUCAP 87    Recent Results (from the past 240 hour(s))  SARS Coronavirus 2 (CEPHEID - Performed in [facility] hospital lab), Hosp Order     Status: None   Collection Time: 03/31/19  8:43 PM    Specimen: Nasopharyngeal Swab  Result Value Ref Range Status   SARS Coronavirus 2 NEGATIVE NEGATIVE Final    Comment: (NOTE) If result is NEGATIVE SARS-CoV-2 target nucleic acids are NOT DETECTED. The SARS-CoV-2 RNA is generally detectable in upper and lower  respiratory specimens during the acute phase of infection. The lowest  concentration of SARS-CoV-2 viral copies this assay can detect is 250  copies / mL. A negative result does not preclude SARS-CoV-2 infection  and should not be used as the sole basis for treatment or other  patient management decisions.  A negative result may occur with  improper specimen collection / handling, submission of specimen other  than nasopharyngeal swab, presence of viral mutation(s) within the  areas targeted by this assay, and inadequate number of viral copies  (<250 copies / mL). A negative result must be combined with clinical  observations, patient history, and epidemiological information. If result is POSITIVE SARS-CoV-2 target nucleic acids are DETECTED. The SARS-CoV-2 RNA is generally detectable in upper and lower  respiratory specimens dur ing the acute phase of infection.  Positive  results are indicative of active infection with SARS-CoV-2.  Clinical  correlation with patient history and other diagnostic information is  necessary to determine patient infection status.  Positive results do  not rule out bacterial infection or co-infection with other viruses. If result is PRESUMPTIVE POSTIVE SARS-CoV-2 nucleic acids MAY BE PRESENT.   A presumptive positive result was obtained on the submitted specimen  and confirmed on repeat testing.  While 2019 novel coronavirus  (SARS-CoV-2) nucleic acids may be present in the submitted sample  additional confirmatory testing may be necessary for epidemiological  and / or clinical management purposes  to differentiate between  SARS-CoV-2 and other Sarbecovirus currently known to infect humans.  If  clinically indicated additional testing with an alternate test  methodology [phone]) is advised. The SARS-CoV-2 RNA is generally  detectable in upper and lower respiratory sp ecimens during the acute  phase of infection. The expected result is Negative. Fact Sheet for Patients:  StrictlyIdeas.no Fact Sheet for Healthcare Providers: BankingDealers.co.za This test is not yet approved or cleared by the [place] FDA and has been authorized for detection and/or diagnosis of SARS-CoV-2 by FDA under an Emergency Use Authorization (EUA).  This EUA will remain in effect (meaning this test can be used) for the duration of the  COVID-19 declaration under Section 564(b)(1) of the Act, 21 U.S.C. section 360bbb-3(b)(1), unless the authorization is terminated or revoked sooner. Performed at [facility], [address] [place], [place] [address]   Culture, blood (routine x 2)     Status: Abnormal   Collection Time: 03/31/19  8:43 PM   Specimen: BLOOD  Result Value Ref Range Status   Specimen Description   Final    BLOOD LEFT ANTECUBITAL Performed at [facility], [address] [place], [place] [address]    Special Requests   Final    BOTTLES DRAWN AEROBIC AND ANAEROBIC Blood Culture adequate volume Performed at [facility], [address], [place], [place] [address]    Culture  Setup Time   Final    GRAM POSITIVE COCCI AEROBIC BOTTLE ONLY CRITICAL VALUE NOTED.  VALUE IS CONSISTENT WITH PREVIOUSLY REPORTED AND CALLED VALUE. [place] Performed at [facility], [place] [address], [place], [place] [address]    Culture STAPHYLOCOCCUS EPIDERMIDIS (A)  Final   Report Status 04/04/2019 FINAL  Final   Organism ID, Bacteria STAPHYLOCOCCUS EPIDERMIDIS  Final      Susceptibility   Staphylococcus epidermidis - MIC*    CIPROFLOXACIN <=0.5 SENSITIVE Sensitive     ERYTHROMYCIN  >=8 RESISTANT Resistant     GENTAMICIN <=0.5 SENSITIVE Sensitive     OXACILLIN >=4 RESISTANT Resistant     TETRACYCLINE <=1 SENSITIVE Sensitive     VANCOMYCIN 2 SENSITIVE Sensitive     TRIMETH/SULFA <=10 SENSITIVE Sensitive     CLINDAMYCIN <=0.25 SENSITIVE Sensitive     RIFAMPIN <=0.5 SENSITIVE Sensitive     Inducible Clindamycin NEGATIVE Sensitive     * STAPHYLOCOCCUS EPIDERMIDIS  Culture, blood (routine x 2)     Status: Abnormal   Collection Time: 03/31/19  8:43 PM   Specimen: BLOOD  Result Value Ref Range Status   Specimen Description   Final    BLOOD RIGHT ANTECUBITAL Performed at [facility], [address] [place], [place] [address]    Special Requests   Final    BOTTLES DRAWN AEROBIC AND ANAEROBIC Blood Culture adequate volume Performed at [facility], [address] [place],  [address]    Culture  Setup Time   Final    GRAM POSITIVE COCCI IN BOTH AEROBIC AND ANAEROBIC BOTTLES CRITICAL RESULT CALLED TO, READ BACK BY AND VERIFIED WITH: MYRA SLAUGHTER ON 04/01/2019 AT 1344 QSD GRAM STAIN REVIEWED-AGREE WITH RESULT T. TYSOR    Culture (A)  Final    STAPHYLOCOCCUS EPIDERMIDIS SUSCEPTIBILITIES PERFORMED ON PREVIOUS CULTURE WITHIN THE LAST 5 DAYS. Performed at [facility], [place] [address] [place],  [address]    Report Status 04/04/2019 FINAL  Final  Blood Culture ID Panel (Reflexed)     Status: Abnormal   Collection Time: 03/31/19  8:43 PM  Result Value Ref Range Status   Enterococcus species NOT DETECTED NOT DETECTED Final   Listeria monocytogenes NOT DETECTED NOT DETECTED Final   Staphylococcus species DETECTED (A) NOT DETECTED Final    Comment: Methicillin (oxacillin) resistant coagulase negative staphylococcus. Possible blood culture contaminant (unless isolated from more than one blood culture draw or clinical case suggests pathogenicity). No antibiotic treatment is indicated for blood  culture contaminants. CRITICAL RESULT  CALLED TO, READ BACK BY AND VERIFIED WITH: MYRA SLAUGHTER ON 04/01/2019 AT 1344 QSD    Staphylococcus aureus (BCID) NOT DETECTED NOT DETECTED Final   Methicillin resistance DETECTED (A)  NOT DETECTED Final    Comment: CRITICAL RESULT CALLED TO, READ BACK BY AND VERIFIED WITH: MYRA SLAUGHTER ON 04/01/2019 AT 1344 QSD    Streptococcus species NOT DETECTED NOT DETECTED Final   Streptococcus agalactiae NOT DETECTED NOT DETECTED Final   Streptococcus pneumoniae NOT DETECTED NOT DETECTED Final   Streptococcus pyogenes NOT DETECTED NOT DETECTED Final   Acinetobacter baumannii NOT DETECTED NOT DETECTED Final   Enterobacteriaceae species NOT DETECTED NOT DETECTED Final   Enterobacter cloacae complex NOT DETECTED NOT DETECTED Final   Escherichia coli NOT DETECTED NOT DETECTED Final   Klebsiella oxytoca NOT DETECTED NOT DETECTED Final   Klebsiella pneumoniae NOT DETECTED NOT DETECTED Final   Proteus species NOT DETECTED NOT DETECTED Final   Serratia marcescens NOT DETECTED NOT DETECTED Final   Haemophilus influenzae NOT DETECTED NOT DETECTED Final   Neisseria meningitidis NOT DETECTED NOT DETECTED Final   Pseudomonas aeruginosa NOT DETECTED NOT DETECTED Final   Candida albicans NOT DETECTED NOT DETECTED Final   Candida glabrata NOT DETECTED NOT DETECTED Final   Candida krusei NOT DETECTED NOT DETECTED Final   Candida parapsilosis NOT DETECTED NOT DETECTED Final   Candida tropicalis NOT DETECTED NOT DETECTED Final    Comment: Performed at [facility], [address] [place], [place] [address]  Urine culture     Status: Abnormal   Collection Time: 03/31/19 10:13 PM   Specimen: Urine, Random  Result Value Ref Range Status   Specimen Description   Final    URINE, RANDOM Performed at [facility], [address], [place], [place] [address]    Special Requests   Final    NONE Performed at [facility], [address] [place], [place] [address]    Culture MULTIPLE  SPECIES PRESENT, SUGGEST RECOLLECTION (A)  Final   Report Status 04/02/2019 FINAL  Final  MRSA PCR Screening     Status: None   Collection Time: 04/01/19  1:54 AM   Specimen: Nasopharyngeal  Result Value Ref Range Status   MRSA by PCR NEGATIVE NEGATIVE Final    Comment:        The GeneXpert MRSA Assay (FDA approved for NASAL specimens only), is one component of a comprehensive MRSA colonization surveillance program. It is not intended to diagnose MRSA infection nor to guide or monitor treatment for MRSA infections. Performed at [facility], [address] [place], [place] [address]   CULTURE, BLOOD (ROUTINE X 2) w Reflex to ID Panel     Status: None   Collection Time: 04/02/19  3:05 PM   Specimen: BLOOD RIGHT WRIST  Result Value Ref Range Status   Specimen Description BLOOD RIGHT WRIST  Final   Special Requests NONE  Final   Culture   Final    NO GROWTH 5 DAYS Performed at [facility], [address], [place], [place] [address]    Report Status 04/07/2019 FINAL  Final  Culture, blood (Routine X 2) w Reflex to ID Panel     Status: None   Collection Time: 04/02/19 10:45 PM   Specimen: BLOOD  Result Value Ref Range Status   Specimen Description BLOOD RIGHT ASSIST CONTROL  Final   Special Requests   Final    BOTTLES DRAWN AEROBIC AND ANAEROBIC Blood Culture adequate volume   Culture   Final    NO GROWTH 5 DAYS Performed at [facility], [address], [place],  [address]    Report Status 04/07/2019 FINAL  Final  CULTURE, BLOOD (ROUTINE X 2) w Reflex to ID Panel     Status: None   Collection Time: 04/05/19  1:33 PM   Specimen: BLOOD RIGHT ARM  Result Value Ref Range Status   Specimen Description BLOOD RIGHT ARM  Final   Special Requests   Final    BOTTLES DRAWN AEROBIC AND ANAEROBIC Blood Culture results may not be optimal due to an excessive volume of blood received in culture bottles   Culture   Final    NO GROWTH 5  DAYS Performed at [facility], [address] [place], [place] [address]    Report Status 04/10/2019 FINAL  Final  CULTURE, BLOOD (ROUTINE X 2) w Reflex to ID Panel     Status: None   Collection Time: 04/05/19  1:42 PM   Specimen: BLOOD LEFT ARM  Result Value Ref Range Status   Specimen Description BLOOD LEFT ARM  Final   Special Requests   Final    BOTTLES DRAWN AEROBIC AND ANAEROBIC Blood Culture results may not be optimal due to an excessive volume of blood received in culture bottles   Culture   Final    NO GROWTH 5 DAYS Performed at [facility], [address] [place], [place] [address]    Report Status 04/10/2019 FINAL  Final  MRSA PCR Screening     Status: None   Collection Time: 04/07/19  9:08 AM   Specimen: Nasopharyngeal  Result Value Ref Range Status   MRSA by PCR NEGATIVE NEGATIVE Final    Comment:        The GeneXpert MRSA Assay (FDA approved for NASAL specimens only), is one component of a comprehensive MRSA colonization surveillance program. It is not intended to diagnose MRSA infection nor to guide or monitor treatment for MRSA infections. Performed at [facility], [address], [place], [place] [address]      Studies: No results found.  Scheduled Meds: . cloNIDine  0.1 mg Oral TID  . diazepam  5 mg Oral G9F  . folic acid  1 mg Intravenous Daily  . mouth rinse  15 mL Mouth Rinse BID  . multivitamin with minerals  1 tablet Oral Daily  . niacin  500 mg Oral QHS  . pantoprazole (PROTONIX) IV  40 mg Intravenous Q12H  . sodium chloride flush  3 mL Intravenous Q12H  . vitamin C  500 mg Oral BID   Continuous Infusions: . sodium chloride Stopped (04/06/19 0201)  . cefTRIAXone (ROCEPHIN)  IV 2 g (04/10/19 1209)  . dexmedetomidine (PRECEDEX) IV infusion 0.1 mcg/kg/hr (04/10/19 1059)  . thiamine injection 500 mg (04/09/19 2021)  . vancomycin 1,500 mg (04/10/19 0951)    Assessment/Plan:  1. Acute respiratory failure  with hypoxia from multifocal pneumonia-healthcare associated pneumonia.  Ongoing aspiration, clinically improving.  Off BiPAP.  Off  high flow oxygen, wean off oxygen as tolerated on vancomycin and intensivist added Rocephin as patient was already on Unasyn, bronchodilator treatments as needed.  Discussed with intensivist. 2. Staph epidermidis bacteremia on vancomycin.  Echocardiogram negative.  Spoke with cardiology and they do not want to do a TEE.   infectious disease specialist and recommending  2 to 4 weeks of IV antibiotics.  Continue to watch temperature curve.  Follow-up with ID 3. Aspiration pneumonia.  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  Scar tissue ID is recommending ENT evaluation. repeat chest x-ray showed worsening aspiration.  Infectious disease is  following.  Appreciate their recommendations, will f/u with ID  4. Acute blood loss anemia with GI bleed.  Recent endoscopy with peptic ulcer disease and gastritis.  On Protonix. 5. Hypokalemia and hypophosphatemia.  Replete and recheck 6. Acute rhabdomyolysis improved with IV fluids 7. Alcohol withdrawal with hallucinations on alcohol withdrawal protocol.  Nurse asking for some standing dose Ativan.  Ammonia level 18. 8. Hypertension on Norvasc  Plan discussed with intensivist  Code Status:     Code Status Orders  (From admission, onward)         Start     Ordered   03/31/19 2341  Full code  Continuous     03/31/19 2340        Code Status History    Date Active Date Inactive Code Status Order ID Comments User Context   12/09/2017 1336 12/12/2017 1845 Full Code [id number]  Dustin Flock, MD Inpatient   Advance Care Planning Activity     Family Communication: Spoke with the patient's friend Minda Ditto.  He endorsed that patient does not have any family members who is willing to take care of him and also he refused to be his healthcare power of attorney as it is  overwhelming .  Will follow up with social worker Disposition Plan: No plan at this point in time  Consultants:  Infectious disease  Intensivist  Antibiotics: Vancomycin Rocephin Critical care time spent: 41minutes  [place]

## 2019-04-10 NOTE — Progress Notes (Signed)
Pharmacy Electrolyte Monitoring Consult:  Pharmacy consulted to assist in monitoring and replacing electrolytes in this 63 y.o. male admitted on 03/31/2019 for GI Bleed. Patient admitted to ICU fore severe alcohol withdrawal and alcoholic encephalopathy.   Labs:  Sodium (mmol/L)  Date Value  04/10/2019 144   Potassium (mmol/L)  Date Value  04/10/2019 3.6   Magnesium (mg/dL)  Date Value  04/09/2019 2.0   Phosphorus (mg/dL)  Date Value  04/10/2019 2.2 (L)   Calcium (mg/dL)  Date Value  04/10/2019 7.0 (L)   Albumin (g/dL)  Date Value  04/10/2019 2.0 (L)   Corrected Calcium: 8.6   Assessment/Plan: Potassium 13mEq PO x 1.   Will obtain follow up labs in am.   Will replace for goal potassium ~4, goal magnesium ~ 2, and goal phosphorus ~ 2.5.   Pharmacy will continue to monitor and adjust per consult.   Simpson,Michael L 04/10/2019 4:34 PM

## 2019-04-10 NOTE — Consult Note (Signed)
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  He has been admitted to the hospital for severe aspiration pneumonia and alcohol withdrawal.  ENT was asked to evaluate for possible laryngeal abnormality which could be leading to aspiration. He is answering questions appropriately has a normal voice. Allergies: No Known Allergies  ROS: Review of systems normal other than 12 systems except per HPI.  PMH:  Past Medical History:  Diagnosis Date  . ED (erectile dysfunction)   . Hypercholesteremia   . Hypertension   . Lung cancer [facility])     FH:  Family History  Problem Relation Age of Onset  . Cancer Mother   . Hypertension Father   . Cancer Paternal Grandmother     SH:  Social History   Socioeconomic History  . Marital status: Married    Spouse name: Not on file  . Number of children: Not on file  . Years of education: Not on file  . Highest education level: Not on file  Occupational History  . Not on file  Social Needs  . Financial resource strain: Not on file  . Food insecurity    Worry: Not on file    Inability: Not on file  . Transportation needs    Medical: Not on file    Non-medical: Not on file  Tobacco Use  . Smoking status: Never Smoker  . Smokeless tobacco: Never Used  Substance and Sexual Activity  . Alcohol use: No    Frequency: Never    Comment: Quit 2 days ago  . Drug use: No  . Sexual activity: Not on file  Lifestyle  . Physical activity    Days per week: Not on file    Minutes per session: Not on file  . Stress: Not on file  Relationships  . Social Herbalist on phone: Not on file    Gets together: Not on file    Attends religious service: Not on file    Active member of club or organization: Not on file    Attends meetings of clubs or organizations: Not on file     Relationship status: Not on file  . Intimate partner violence    Fear of current or ex partner: Not on file    Emotionally abused: Not on file    Physically abused: Not on file    Forced sexual activity: Not on file  Other Topics Concern  . Not on file  Social History Narrative  . Not on file    PSH:  Past Surgical History:  Procedure Laterality Date  . HERNIA REPAIR  1958  . REPLACEMENT TOTAL KNEE BILATERAL  2009  . TONSILLECTOMY  2008    Physical  Exam: CN 2-12 grossly intact and symmetric. EAC/TMs normal BL. Oral cavity, lips, gums, ororpharynx normal with no masses or lesions. Skin warm and dry. Nasal cavity without polyps or purulence. External nose and ears without masses or lesions. EOMI, PERRLA. Neck supple with no masses or lesions is a previous neck dissection scar on the right. No lymphadenopathy palpated. Thyroid normal with no masses.  Procedure: Flexible fiberoptic laryngoscopy at bedside.  Topical anesthetic and phenylephrine lidocaine solution was introduced into each nostril approximately 1-1/2 cc was used.  After proximately 10 minutes a flexible fiberoptic laryngoscope was introduced through the left nostril.  Examination of  the larynx showed normal epiglottis normal vocal cord mobility no evidence of tumor mass.   A/P: Aspiration pneumonia-there is no anatomic deformity which I can detect that would cause aspiration.   I would recommend a modified barium swallow or a FEES study done by speech and language pathology if aspiration is a concern this would allow for advice regarding diet in the future.  ICU time approx 35 mins.   George Wade 04/10/2019 5:50 PM

## 2019-04-11 DIAGNOSIS — Z85850 Personal history of malignant neoplasm of thyroid: Secondary | ICD-10-CM

## 2019-04-11 LAB — BASIC METABOLIC PANEL
Anion gap: 8 (ref 5–15)
BUN: <5 mg/dL — ABNORMAL LOW (ref 8–23)
CO2: 21 mmol/L — ABNORMAL LOW (ref 22–32)
Calcium: 7 mg/dL — ABNORMAL LOW (ref 8.9–10.3)
Chloride: 111 mmol/L (ref 98–111)
Creatinine, Ser: 0.56 mg/dL — ABNORMAL LOW (ref 0.61–1.24)
GFR calc Af Amer: >60 mL/min (ref >60)
GFR calc non Af Amer: >60 mL/min (ref >60)
Glucose, Bld: 117 mg/dL — ABNORMAL HIGH (ref 70–99)
Potassium: 3.5 mmol/L (ref 3.5–5.1)
Sodium: 140 mmol/L (ref 135–145)

## 2019-04-11 LAB — CBC
HCT: 26.7 % — ABNORMAL LOW (ref 39.0–52.0)
Hemoglobin: 7.4 g/dL — ABNORMAL LOW (ref 13.0–17.0)
MCH: 22.8 pg — ABNORMAL LOW (ref 26.0–34.0)
MCHC: 27.7 g/dL — ABNORMAL LOW (ref 30.0–36.0)
MCV: 82.4 fL (ref 80.0–100.0)
Platelets: 342 10*3/uL (ref 150–400)
RBC: 3.24 MIL/uL — ABNORMAL LOW (ref 4.22–5.81)
WBC: 15.2 10*3/uL — ABNORMAL HIGH (ref 4.0–10.5)
nRBC: 0.1 % (ref 0.0–0.2)

## 2019-04-11 LAB — MAGNESIUM: Magnesium: 2 mg/dL (ref 1.7–2.4)

## 2019-04-11 LAB — PHOSPHORUS: Phosphorus: 1.4 mg/dL — ABNORMAL LOW (ref 2.5–4.6)

## 2019-04-11 LAB — PROCALCITONIN: Procalcitonin: 0.55 ng/mL

## 2019-04-11 MED ORDER — FOLIC ACID 1 MG PO TABS
1.0000 mg | ORAL_TABLET | Freq: Every day | ORAL | Status: DC
  Administered 2019-04-12 – 2019-04-28 (×17): 1 mg via ORAL
  Filled 2019-04-11 (×17): qty 1

## 2019-04-11 MED ORDER — VITAMIN B-1 100 MG PO TABS
100.0000 mg | ORAL_TABLET | Freq: Every day | ORAL | Status: DC
  Administered 2019-04-12 – 2019-04-16 (×5): 100 mg via ORAL
  Filled 2019-04-11 (×5): qty 1

## 2019-04-11 MED ORDER — POTASSIUM PHOSPHATE MONOBASIC 500 MG PO TABS
500.0000 mg | ORAL_TABLET | Freq: Three times a day (TID) | ORAL | Status: DC
  Administered 2019-04-11 – 2019-04-27 (×59): 500 mg via ORAL
  Filled 2019-04-11 (×68): qty 1

## 2019-04-11 MED ORDER — POTASSIUM PHOSPHATES 15 MMOLE/5ML IV SOLN
30.0000 mmol | Freq: Once | INTRAVENOUS | Status: AC
  Administered 2019-04-11: 09:00:00 30 mmol via INTRAVENOUS
  Filled 2019-04-11: qty 10

## 2019-04-11 NOTE — Progress Notes (Signed)
[name], male   DOB: 06/01/1956, 63 y.o.   MRN: [id number]  [employer] PROGRESS NOTE  George Wade LDJ:[id number] DOB: 04/17/1956 DOA: 03/31/2019 PCP: Patient, [name] Per  HPI/Subjective: Patient is off  precedex gtt , intermittent episodes of  pleasant confusion  , off BiPAP high flow oxygen currently on 4 L of oxygen feels much better awake and alert Objective: Vitals:   04/11/19 1100 04/11/19 1150  BP:  140/90  Pulse: 85 82  Resp: (!) 26 18  Temp:  99.4 F (37.4 C)  SpO2: 94% 96%    Filed Weights   03/31/19 1912 04/07/19 0906 04/08/19 0435  Weight: 78 kg 82.3 kg 83.8 kg    ROS: Review of Systems  Constitutional: Negative for chills and fever.  HENT: Negative for nosebleeds and tinnitus.   Eyes: Negative for blurred vision.  Respiratory: Positive for cough. Negative for shortness of breath.   Cardiovascular: Negative for palpitations.  Gastrointestinal: Negative for abdominal pain.  Genitourinary: Negative for dysuria and frequency.  Skin: Negative for itching.  Neurological: Negative for dizziness, tremors and weakness.  Psychiatric/Behavioral: Negative for suicidal ideas. The patient is not nervous/anxious.    Exam: Physical Exam  HENT:  Nose: No mucosal edema.  Mouth/Throat: No oropharyngeal exudate or posterior oropharyngeal edema.  Eyes: Pupils are equal, round, and reactive to light. Conjunctivae, EOM and lids are normal.  Neck: No JVD present. Carotid bruit is not present. No edema present. No thyroid mass and no thyromegaly present.  Cardiovascular: S1 normal and S2 normal. Exam reveals no gallop.  No murmur heard. Pulses:      Dorsalis pedis pulses are 2+ on the right side and 2+ on the left side.  Respiratory: No respiratory distress. He has decreased breath sounds in the right lower field and the left lower field. He has no wheezes. He has rhonchi in the right lower field and the left lower field. He has no rales.  Positive  crackles bilaterally, diminished breath sounds  GI: Soft. Bowel sounds are normal. There is no abdominal tenderness.  Musculoskeletal:     Right ankle: He exhibits swelling.     Left ankle: He exhibits swelling.  Lymphadenopathy:    He has no cervical adenopathy.  Neurological: He is alert.  Skin: Skin is warm. Nails show no clubbing.  Skin abrasions bilateral knees.  Psychiatric: His affect is blunt.      Data Reviewed: Basic Metabolic Panel: Recent Labs  Lab 04/05/19 0424  04/07/19 0458 04/08/19 0315 04/09/19 0414 04/09/19 2153 04/10/19 0513 04/11/19 0407  NA 137   < > 139 140 138  --  144 140  K 3.7   < > 3.7 3.6 3.3* 3.4* 3.6 3.5  CL 104   < > 110 108 106  --  114* 111  CO2 24   < > 21* 24 23  --  23 21*  GLUCOSE 110*   < > 97 94 144*  --  154* 117*  BUN <5*   < > 9 10 8   --  6* <5*  CREATININE 0.39*   < > 0.47* 0.43* 0.37*  --  0.50* 0.56*  CALCIUM 7.9*   < > 6.9* 7.0* 7.1*  --  7.0* 7.0*  MG 1.9  --  2.0 2.3 2.0  --   --  2.0  PHOS 1.3*   < > 2.2* 1.6* <1.0* 2.2* 2.2* 1.4*   < > = values in this interval not displayed.  Liver Function Tests: Recent Labs  Lab 04/07/19 0458 04/10/19 0513  AST 80*  --   ALT 76*  --   ALKPHOS 213*  --   BILITOT 3.6*  --   PROT 5.0*  --   ALBUMIN 2.0* 2.0*   CBC: Recent Labs  Lab 04/05/19 0424 04/07/19 0458 04/07/19 1720 04/09/19 0414 04/11/19 0407  WBC 16.5* 12.7* 11.7* 16.0* 15.2*  HGB 8.4* 7.1* 7.8* 7.5* 7.4*  HCT 29.9* 25.1* 29.1* 26.9* 26.7*  MCV 80.8 80.4 83.6 80.1 82.4  PLT 206 282 277 313 342   Cardiac Enzymes: No results for input(s): CKTOTAL, CKMB, CKMBINDEX, TROPONINI in the last 168 hours.  CBG: Recent Labs  Lab 04/07/19 0901  GLUCAP 87    Recent Results (from the past 240 hour(s))  CULTURE, BLOOD (ROUTINE X 2) w Reflex to ID Panel     Status: None   Collection Time: 04/02/19  3:05 PM   Specimen: BLOOD RIGHT WRIST  Result Value Ref Range Status   Specimen Description BLOOD RIGHT WRIST  Final    Special Requests NONE  Final   Culture   Final    NO GROWTH 5 DAYS Performed at [facility], [address] [place], [place] [address]    Report Status 04/07/2019 FINAL  Final  Culture, blood (Routine X 2) w Reflex to ID Panel     Status: None   Collection Time: 04/02/19 10:45 PM   Specimen: BLOOD  Result Value Ref Range Status   Specimen Description BLOOD RIGHT ASSIST CONTROL  Final   Special Requests   Final    BOTTLES DRAWN AEROBIC AND ANAEROBIC Blood Culture adequate volume   Culture   Final    NO GROWTH 5 DAYS Performed at [facility], [address] [place], [place] [address]    Report Status 04/07/2019 FINAL  Final  CULTURE, BLOOD (ROUTINE X 2) w Reflex to ID Panel     Status: None   Collection Time: 04/05/19  1:33 PM   Specimen: BLOOD RIGHT ARM  Result Value Ref Range Status   Specimen Description BLOOD RIGHT ARM  Final   Special Requests   Final    BOTTLES DRAWN AEROBIC AND ANAEROBIC Blood Culture results may not be optimal due to an excessive volume of blood received in culture bottles   Culture   Final    NO GROWTH 5 DAYS Performed at [facility], [address] [place], [place] [address]    Report Status 04/10/2019 FINAL  Final  CULTURE, BLOOD (ROUTINE X 2) w Reflex to ID Panel     Status: None   Collection Time: 04/05/19  1:42 PM   Specimen: BLOOD LEFT ARM  Result Value Ref Range Status   Specimen Description BLOOD LEFT ARM  Final   Special Requests   Final    BOTTLES DRAWN AEROBIC AND ANAEROBIC Blood Culture results may not be optimal due to an excessive volume of blood received in culture bottles   Culture   Final    NO GROWTH 5 DAYS Performed at [facility], [address], [place],  [address]    Report Status 04/10/2019 FINAL  Final  MRSA PCR Screening     Status: None   Collection Time: 04/07/19  9:08 AM   Specimen: Nasopharyngeal  Result Value Ref Range Status   MRSA by PCR NEGATIVE NEGATIVE  Final    Comment:        The GeneXpert MRSA Assay (FDA approved  for NASAL specimens only), is one component of a comprehensive MRSA colonization surveillance program. It is not intended to diagnose MRSA infection nor to guide or monitor treatment for MRSA infections. Performed at [facility], [address], [place], [place] [address]      Studies: No results found.  Scheduled Meds: . cloNIDine  0.1 mg Oral TID  . diazepam  5 mg Oral Q6H  . [START ON 04/13/1637] folic acid  1 mg Oral Daily  . mouth rinse  15 mL Mouth Rinse BID  . multivitamin with minerals  1 tablet Oral Daily  . niacin  500 mg Oral QHS  . pantoprazole (PROTONIX) IV  40 mg Intravenous Q12H  . potassium phosphate (monobasic)  500 mg Oral TID WC & HS  . sodium chloride flush  3 mL Intravenous Q12H  . [START ON 04/12/2019] thiamine  100 mg Oral Daily  . vitamin C  500 mg Oral BID   Continuous Infusions: . sodium chloride Stopped (04/06/19 0201)  . cefTRIAXone (ROCEPHIN)  IV Stopped (04/10/19 2049)  . potassium PHOSPHATE IVPB (in mmol) 85 mL/hr at 04/11/19 0839  . thiamine injection Stopped (04/11/19 0237)  . vancomycin 1,500 mg (04/11/19 1033)    Assessment/Plan:  1. Acute respiratory failure with hypoxia from multifocal pneumonia-healthcare associated pneumonia.  Ongoing aspiration, clinically improving.  Off BiPAP.  Off  high flow oxygen, wean off oxygen as tolerated on vancomycin and intensivist added Rocephin as patient was already on Unasyn, bronchodilator treatments as needed.  Discussed with intensivist and ID 2. Staph epidermidis bacteremia on vancomycin.  Echocardiogram negative.  Spoke with cardiology and they do not want to do a TEE.   infectious disease specialist and recommending  2 to 4 weeks of IV antibiotics.  Continue to watch temperature curve.  Follow-up with ID 3. Aspiration pneumonia.  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   Scar tissue ID is recommending ENT evaluation. repeat chest x-ray showed worsening aspiration.  Seen by Dr. Tami Ribas from ENT, no anatomical deformity was detected which could cause aspiration.  Recommending modified barium swallow or FEES study done by speech therapy if aspiration is a concern appreciate their recommendations,  will f/u with ID  4. Acute blood loss anemia with GI bleed.  Recent endoscopy with peptic ulcer disease and gastritis.  On Protonix. 5. Hypokalemia and hypophosphatemia.  Replete and recheck 6. Acute rhabdomyolysis improved with IV fluids 7. Alcohol withdrawal with hallucinations on alcohol withdrawal protocol.  Nurse asking for some standing dose Ativan.  Ammonia level 18. 8. Hypertension on Norvasc 9. Generalized weakness PT consult  Plan discussed with intensivist.  Patient was transferred from ICU to regular floor.  Will follow with social worker for disposition  Code Status:     Code Status Orders  (From admission, onward)         Start     Ordered   03/31/19 2341  Full code  Continuous     03/31/19 2340        Code Status History    Date Active Date Inactive Code Status Order ID Comments User Context   12/09/2017 1336 12/12/2017 1845 Full Code [id number]  Dustin Flock, MD Inpatient   Advance Care Planning Activity     Family Communication: Spoke with the patient's friend Minda Ditto.  He endorsed that patient does not have any family members who is willing to take care of him and also he refused to be  his healthcare power of attorney as it is overwhelming .  Will follow up with social worker Disposition Plan: No plan at this point in time  Consultants:  Infectious disease  Intensivist  Antibiotics: Vancomycin Rocephin Critical care time spent: 55minutes  [place]

## 2019-04-11 NOTE — Evaluation (Signed)
Physical Therapy RE-Evaluation Patient Details Name: George Wade MRN: [id number] DOB: 1956/08/10 Today's Date: 04/11/2019   History of Present Illness  presented to ER secondary to progressive weakness, black/tarry stools and fall/unable to recover (outside in rain x4-5 hours); admitted for management of GIB, aspiration pna, encephalopathy and staph bacteremia. Stay complicated by CCU admission secondary to aspiration pneumonia. and alcohol withdrawl. Pt currently requesting cough medicine, RN notified.  Clinical Impression  Re-evaluation performed this date. Pt is a pleasant 63 year old male who was admitted for GI bleed with stay complicated by CCU admission with aspiration pneumonia. Cognition appears improved from previous session, however still foggy. Pt performs bed mobility with min assist, transfers with mod assist, and ambulation with mod assist and RW. R foot drop noted. Only able to ambulate a few feet, limited by fatigue. Pt follows commands well and eager to participate. Pt demonstrates deficits with strength/mobility/balance. Pt on RA at beginning of session with sats at 86%. Donned 2L of O2 with increase to 92%, however decreases with exertion to 84% with SOB symptoms. Cued for pursed lip breathing. Would benefit from skilled PT to address above deficits and promote optimal return to PLOF; recommend transition to STR upon discharge from acute hospitalization.      Follow Up Recommendations SNF    Equipment Recommendations  Rolling walker with 5" wheels    Recommendations for Other Services       Precautions / Restrictions Precautions Precautions: Fall Restrictions Weight Bearing Restrictions: No      Mobility  Bed Mobility Overal bed mobility: Needs Assistance Bed Mobility: Supine to Sit     Supine to sit: Min assist     General bed mobility comments: needs assist sliding B LEs off bed. ONce seated at EOB, able to sit with upright posture.   Transfers Overall  transfer level: Needs assistance Equipment used: Rolling walker (2 wheeled) Transfers: Sit to/from Stand Sit to Stand: Mod assist         General transfer comment: elevated bed. RW used. Unsteady. 2 attempts each lasting approx 30 seconds prior to fatigue  Ambulation/Gait Ambulation/Gait assistance: Mod assist Gait Distance (Feet): 2 Feet Assistive device: Rolling walker (2 wheeled) Gait Pattern/deviations: Step-to pattern     General Gait Details: side stepped towards HOB. Has difficulty taking step with R foot, drag noted  Stairs            Wheelchair Mobility    Modified Rankin (Stroke Patients Only)       Balance Overall balance assessment: Needs assistance Sitting-balance support: Feet supported;Bilateral upper extremity supported Sitting balance-Leahy Scale: Good     Standing balance support: Bilateral upper extremity supported Standing balance-Leahy Scale: Poor                               Pertinent Vitals/Pain Pain Assessment: No/denies pain    Home Living Family/patient expects to be discharged to:: Private residence Living Arrangements: Alone Available Help at Discharge: Friend(s);Available PRN/intermittently Type of Home: (RV) Home Access: Stairs to enter Entrance Stairs-Rails: Right;Left;Can reach both Entrance Stairs-Number of Steps: 6 Home Layout: One level Home Equipment: None      Prior Function Level of Independence: Independent         Comments: Indep with ADLs, household and community mobilization without assist device     Hand Dominance   Dominant Hand: Right    Extremity/Trunk Assessment   Upper Extremity Assessment Upper Extremity Assessment: Generalized  weakness(B UE grossly 3+/5)    Lower Extremity Assessment Lower Extremity Assessment: Generalized weakness(B LE grossly 3+/5)       Communication   Communication: No difficulties  Cognition Arousal/Alertness: Awake/alert Behavior During Therapy:  WFL for tasks assessed/performed Overall Cognitive Status: Within Functional Limits for tasks assessed                                        General Comments      Exercises Other Exercises Other Exercises: supine ther-ex performed including B LE SLRs, hip abd/add, and AP (mod A for R). 10 reps with min assist   Assessment/Plan    PT Assessment Patient needs continued PT services  PT Problem List Decreased strength;Decreased activity tolerance;Decreased mobility;Decreased cognition;Decreased safety awareness;Cardiopulmonary status limiting activity;Decreased range of motion;Decreased balance;Decreased coordination;Decreased knowledge of use of DME;Decreased knowledge of precautions;Pain       PT Treatment Interventions DME instruction;Stair training;Therapeutic activities;Balance training;Cognitive remediation;Therapeutic exercise;Gait training;Functional mobility training;Patient/family education;Neuromuscular re-education    PT Goals (Current goals can be found in the Care Plan section)  Acute Rehab PT Goals Patient Stated Goal: to walk again PT Goal Formulation: With patient Time For Goal Achievement: 04/25/19 Potential to Achieve Goals: Good    Frequency Min 2X/week   Barriers to discharge Decreased caregiver support      Co-evaluation               AM-PAC PT "6 Clicks" Mobility  Outcome Measure Help needed turning from your back to your side while in a flat bed without using bedrails?: A Little Help needed moving from lying on your back to sitting on the side of a flat bed without using bedrails?: A Lot Help needed moving to and from a bed to a chair (including a wheelchair)?: Total Help needed standing up from a chair using your arms (e.g., wheelchair or bedside chair)?: Total Help needed to walk in hospital room?: Total Help needed climbing 3-5 steps with a railing? : Total 6 Click Score: 9    End of Session Equipment Utilized During  Treatment: Oxygen;Gait belt Activity Tolerance: Patient tolerated treatment well Patient left: with call bell/phone within reach;in bed Nurse Communication: Mobility status PT Visit Diagnosis: Muscle weakness (generalized) (M62.81);Difficulty in walking, not elsewhere classified (R26.2);History of falling (Z91.81);Unsteadiness on feet (R26.81)    Time: 8921-1941 PT Time Calculation (min) (ACUTE ONLY): 31 min   Charges:   PT Evaluation $PT Eval Low Complexity: 1 Low PT Treatments $Therapeutic Exercise: 8-22 mins        Greggory Stallion, PT, DPT [phone]   Saavi Mceachron 04/11/2019, 4:49 PM

## 2019-04-11 NOTE — Progress Notes (Signed)
ID  Out of ICU Says he is feeling better Confused and seems to be confabulating  Patient Vitals for the past 24 hrs:  BP Temp Temp src Pulse Resp SpO2  04/11/19 1150 140/90 99.4 F (37.4 C) Oral 82 18 96 %  04/11/19 1100 - - - 85 (!) 26 94 %  04/11/19 1025 127/88 - - - - -  04/11/19 1000 - - - 90 (!) 22 95 %  04/11/19 0900 - - - 93 (!) 28 98 %  04/11/19 0800 134/86 99 F (37.2 C) Oral 96 (!) 31 90 %  04/11/19 0700 - - - 87 (!) 26 98 %  04/11/19 0600 130/79 - - 86 (!) 26 97 %  04/11/19 0400 115/82 - - 91 (!) 28 91 %  04/11/19 0200 114/87 99.1 F (37.3 C) Axillary (!) 101 (!) 25 93 %  04/11/19 0000 (!) 149/91 - - 83 (!) 26 97 %  04/10/19 2200 139/88 - - 84 (!) 25 94 %  04/10/19 2000 118/80 99.2 F (37.3 C) Oral 85 (!) 29 96 %  04/10/19 1900 - - - 94 (!) 37 95 %  04/10/19 1800 - - - 89 (!) 28 94 %    Chest b/l air entry Few crepts Both knees abrasion healing  CBC Latest Ref Rng & Units 04/11/2019 04/09/2019 04/07/2019  WBC 4.0 - 10.5 K/uL 15.2(H) 16.0(H) 11.7(H)  Hemoglobin 13.0 - 17.0 g/dL 7.4(L) 7.5(L) 7.8(L)  Hematocrit 39.0 - 52.0 % 26.7(L) 26.9(L) 29.1(L)  Platelets 150 - 400 K/uL 342 313 277     CMP Latest Ref Rng & Units 04/11/2019 04/10/2019 04/09/2019  Glucose 70 - 99 mg/dL 117(H) 154(H) -  BUN 8 - 23 mg/dL <5(L) 6(L) -  Creatinine 0.61 - 1.24 mg/dL 0.56(L) 0.50(L) -  Sodium 135 - 145 mmol/L 140 144 -  Potassium 3.5 - 5.1 mmol/L 3.5 3.6 3.4(L)  Chloride 98 - 111 mmol/L 111 114(H) -  CO2 22 - 32 mmol/L 21(L) 23 -  Calcium 8.9 - 10.3 mg/dL 7.0(L) 7.0(L) -  Total Protein 6.5 - 8.1 g/dL - - -  Total Bilirubin 0.3 - 1.2 mg/dL - - -  Alkaline Phos 38 - 126 U/L - - -  AST 15 - 41 U/L - - -  ALT 0 - 44 U/L - - -    Impression/recommendation  62 y.o.malewith a history ofalcohol abuse, hypertension, hypercholesterolemia, 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 ?  Multi lobar pneumonia worse on the Rt side due to ongoing aspiration- was on unasyn- changed to cefepime and flagyl on 6/26 for worsening leucocytosis and new fevers , which improved but because of encephalopathy they were Dc and started ceftriaxone on 6/27 by intensivist. will give for 5 days  Pt had scc of tonsils ( rt ) and had radiation- ENT saw patient and ruled out local pathology for cause of aspiration  Encephalopathysecondary to alcohol abuse ( wernicke's /korsakoff's psychosis and withdrawal syndrome-fluctuating   Staph epidermidis bacteremia- 3/4 bottle so a true pathogen- likely source could be the skin abrasion on both knees. On vancomycin- 2 d echo done-no vegetation As repeat blood culture promptly negative dont suspect endocarditis, b/l knee TKA site okay except for superficial bruising Will  treat for 2-3  weeks total  Acute GI bleed- has a h/o erosive esophagitis- and gastric ulcer- on PPI  Anemia-due to above- received blood transfusion  Fall secondary to all of the above- rhabdomyolysis    Abnormal LFTS- AST>ALT- improving  Discussed withpatient

## 2019-04-11 NOTE — TOC Progression Note (Signed)
Transition of Care [facility]) - Progression Note    Patient Details  Name: George Wade MRN: [id number] Date of Birth: 01/30/1956  Transition of [facility]) CM/SW Contact  Gladyes Kudo, Lenice Llamas Phone Number: [phone]  04/11/2019, 4:24 PM  Clinical Narrative:  Clinical Social Worker (CSW) met with patient today and he is alert and oriented X3. CSW made patient aware that per his friend [name] he can't come back to the RV where he was living at before coming to the hospital. Patient reported that he does not want anybody at [facility] to talk to [place]. Patient reported that he has nowhere to go at this point and will talk to [place]. Patient reported that he does not want to go to SNF because he can't pay for it. CSW discussed case with CSW Surveyor, quantity. CSW will continue to follow and assist as needed.     Expected Discharge Plan: [place] Barriers to Discharge: Continued Medical Work up  Expected Discharge Plan and Services Expected Discharge Plan: [place] In-house Referral: Clinical Social Work Discharge Planning Services: CM Consult   Living arrangements for the past 2 months: Single Family Home                                       Social Determinants of Health (SDOH) Interventions    Readmission Risk Interventions No flowsheet data found.

## 2019-04-11 NOTE — Progress Notes (Signed)
Pt transferred to Rm #[id number] at this time. Pt alert and oriented. VSS prior to transfer.

## 2019-04-11 NOTE — Progress Notes (Signed)
Pharmacy Electrolyte Monitoring Consult:  Pharmacy consulted to assist in monitoring and replacing electrolytes in this 63 y.o. male admitted on 03/31/2019 for GI Bleed. Patient admitted to ICU fore severe alcohol withdrawal and alcoholic encephalopathy.   Labs:  Sodium (mmol/L)  Date Value  04/11/2019 140   Potassium (mmol/L)  Date Value  04/11/2019 3.5   Magnesium (mg/dL)  Date Value  04/11/2019 2.0   Phosphorus (mg/dL)  Date Value  04/11/2019 1.4 (L)   Calcium (mg/dL)  Date Value  04/11/2019 7.0 (L)   Albumin (g/dL)  Date Value  04/10/2019 2.0 (L)   Corrected Calcium: 8.6   Assessment/Plan: Potassium phosphate 10mmol IV x 1. Will start potassium phosphate 500mg  PO TIDwm.   Will obtain follow up labs in am.   Will replace for goal potassium ~4, goal magnesium ~ 2, and goal phosphorus ~ 2.5.   Pharmacy will continue to monitor and adjust per consult.   Johnattan Strassman L 04/11/2019 11:39 AM

## 2019-04-11 NOTE — Progress Notes (Signed)
CRITICAL CARE NOTE         SUBJECTIVE FINDINGS & SIGNIFICANT EVENTS   63 year old chronic alcoholic, admitted with encephalopathy likely alcoholic in etiology as well as coffee-ground emesis which has resolved now..  Had septic work-up which has revealed repeat blood cultures negative, previously + for staph epi bacteremia, and cxr with right sided infiltrate suggestive of aspiration pneumonia.  - ID on case appreciate collaboration -currently on Rocephin only.  Status post ENT evaluation for possible upper airway lesion which has been ruled out now.  -withdrawal symptoms improved continues now off of Precedex gtt, still confabulates with confusion,  H/H borderline low, status post gastroenterology evaluation.  -Electrolyte imbalance with possible refeeding syndrome, dietitian and clinical pharmacologist evaluation in progress   PAST MEDICAL HISTORY   Past Medical History:  Diagnosis Date  . ED (erectile dysfunction)   . Hypercholesteremia   . Hypertension   . Lung cancer [facility])      SURGICAL HISTORY   Past Surgical History:  Procedure Laterality Date  . HERNIA REPAIR  1958  . REPLACEMENT TOTAL KNEE BILATERAL  2009  . TONSILLECTOMY  2008     FAMILY HISTORY   Family History  Problem Relation Age of Onset  . Cancer Mother   . Hypertension Father   . Cancer Paternal Grandmother      SOCIAL HISTORY   Social History   Tobacco Use  . Smoking status: Never Smoker  . Smokeless tobacco: Never Used  Substance Use Topics  . Alcohol use: No    Frequency: Never    Comment: Quit 2 days ago  . Drug use: No     MEDICATIONS   Current Medication:  Current Facility-Administered Medications:  .  0.9 %  sodium chloride infusion, , Intravenous, PRN, Mayo, Pete Pelt, MD, Stopped at 04/06/19 0201 .   acetaminophen (TYLENOL) tablet 650 mg, 650 mg, Oral, Q6H PRN, Mayo, Pete Pelt, MD, 650 mg at 04/08/19 1515 .  calcium carbonate (TUMS - dosed in mg elemental calcium) chewable tablet 400 mg of elemental calcium, 2 tablet, Oral, Q8H PRN, Mayo, Pete Pelt, MD .  cefTRIAXone (ROCEPHIN) 2 g in sodium chloride 0.9 % 100 mL IVPB, 2 g, Intravenous, Q24H, Tyler Pita, MD, Stopped at 04/10/19 2049 .  cloNIDine (CATAPRES) tablet 0.1 mg, 0.1 mg, Oral, TID, Alondria Mousseau, MD, 0.1 mg at 04/10/19 2143 .  dexmedetomidine (PRECEDEX) 400 MCG/100ML (4 mcg/mL) infusion, 0.2-0.6 mcg/kg/hr, Intravenous, Titrated, Charlett Nose, RPH, Stopped at 04/10/19 1203 .  diazepam (VALIUM) tablet 5 mg, 5 mg, Oral, Q6H, Shamicka Inga, MD, 5 mg at 04/11/19 0830 .  folic acid injection 1 mg, 1 mg, Intravenous, Daily, Lu Duffel, RPH, 1 mg at 04/10/19 6314 .  guaiFENesin-dextromethorphan (ROBITUSSIN DM) 100-10 MG/5ML syrup 5 mL, 5 mL, Oral, Q4H PRN, Tyler Pita, MD, 5 mL at 04/10/19 1825 .  haloperidol lactate (HALDOL) injection 1 mg, 1 mg, Intravenous, Q6H PRN, Tyler Pita, MD, 1 mg at 04/07/19 2313 .  ipratropium-albuterol (DUONEB) 0.5-2.5 (3) MG/3ML nebulizer solution 3 mL, 3 mL, Nebulization, Q4H PRN, Lanney Gins, Keefe Zawistowski, MD .  LORazepam (ATIVAN) injection 1 mg, 1 mg, Intravenous, Q2H PRN, Ottie Glazier, MD, 1 mg at 04/09/19 1351 .  MEDLINE mouth rinse, 15 mL, Mouth Rinse, BID, Wilhelmina Mcardle, MD, 15 mL at 04/10/19 2115 .  multivitamin with minerals tablet 1 tablet, 1 tablet, Oral, Daily, Mayo, Pete Pelt, MD, 1 tablet at 04/10/19 [phone] .  niacin tablet 500 mg, 500 mg,  Oral, Anibal Henderson, MD, 500 mg at 04/10/19 2143 .  [DISCONTINUED] ondansetron (ZOFRAN) tablet 4 mg, 4 mg, Oral, Q6H PRN **OR** ondansetron (ZOFRAN) injection 4 mg, 4 mg, Intravenous, Q6H PRN, Seals, Angela H, NP, 4 mg at 04/04/19 0249 .  pantoprazole (PROTONIX) injection 40 mg, 40 mg, Intravenous, Q12H, Loletha Grayer,  MD, 40 mg at 04/10/19 2143 .  potassium PHOSPHATE 30 mmol in dextrose 5 % 500 mL infusion, 30 mmol, Intravenous, Once, Olivia Canter, [facility], Last Rate: 85 mL/hr at 04/11/19 0834, 30 mmol at 04/11/19 0834 .  sodium chloride flush (NS) 0.9 % injection 10-40 mL, 10-40 mL, Intracatheter, PRN, [place], [place], MD .  sodium chloride flush (NS) 0.9 % injection 3 mL, 3 mL, Intravenous, Q12H, Seals, Angela H, NP, 3 mL at 04/10/19 2115 .  thiamine 500mg  in normal saline (37ml) IVPB, 500 mg, Intravenous, Q24H, Tyler Pita, MD, Stopped at 04/11/19 [phone] .  vancomycin (VANCOCIN) 1,500 mg in sodium chloride 0.9 % 500 mL IVPB, 1,500 mg, Intravenous, Q12H, Pernell Dupre, RPH, Stopped at 04/11/19 0548 .  vitamin C (ASCORBIC ACID) tablet 500 mg, 500 mg, Oral, BID, Leslye Peer, Ibrahima, MD, 500 mg at 04/10/19 2144    ALLERGIES   Patient has no known allergies.    REVIEW OF SYSTEMS    10 point ROS conducted and is negative except as per subjective findings.  Patient reports having doctors appointment in the community which he wants to rush out to go see, as well as a missing VCR.  PHYSICAL EXAMINATION   Vitals:   04/11/19 0600 04/11/19 0700  BP: 130/79   Pulse: 86 87  Resp: (!) 26 (!) 26  Temp:    SpO2: 97% 98%    GENERAL: Anxious appearing HEAD: Normocephalic, atraumatic.  EYES: Pupils equal, round, reactive to light.  No scleral icterus.  MOUTH: Moist mucosal membrane. NECK: Supple. No thyromegaly. No nodules. No JVD.  PULMONARY: Rhonchorous breath sounds worse on the right CARDIOVASCULAR: S1 and S2. Regular rate and rhythm. No murmurs, rubs, or gallops.  GASTROINTESTINAL: Soft, nontender, non-distended. No masses. Positive bowel sounds. No hepatosplenomegaly.  MUSCULOSKELETAL: No swelling, clubbing, or edema.  NEUROLOGIC: Mild distress due to acute illness SKIN:intact,warm,dry   LABS AND IMAGING     LAB RESULTS: Recent Labs  Lab 04/09/19 0414 04/09/19 2153 04/10/19  0513 04/11/19 0407  NA 138  --  144 140  K 3.3* 3.4* 3.6 3.5  CL 106  --  114* 111  CO2 23  --  23 21*  BUN 8  --  6* <5*  CREATININE 0.37*  --  0.50* 0.56*  GLUCOSE 144*  --  154* 117*   Recent Labs  Lab 04/07/19 1720 04/09/19 0414 04/11/19 0407  HGB 7.8* 7.5* 7.4*  HCT 29.1* 26.9* 26.7*  WBC 11.7* 16.0* 15.2*  PLT 277 313 342     IMAGING RESULTS: No results found.    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   Refeeding syndrome  -Phos less than 1 -Multiple electrolyte abnormalities -Appreciate dietitian pharmacology evaluation   Acute blood loss anemia Due to upper GI bleed with coffee-ground emesis -On Protonix IV -Status post GI evaluation-appreciate input  Staph epidermidis bacteremia  -Multiple blood cultures positive -Continue with current IV antibiotics and vancomycin appreciate pharmacy -follow up cultures -consider stress dose steroids   ID -continue IV abx as prescibed -follow up cultures  GI/Nutrition GI PROPHYLAXIS as indicated DIET-->TF's as tolerated Constipation protocol as indicated  ENDO - ICU hypoglycemic\Hyperglycemia protocol -check FSBS per protocol   ELECTROLYTES -follow labs as needed -replace as needed -pharmacy consultation   DVT/GI PRX ordered -SCDs  TRANSFUSIONS AS NEEDED MONITOR FSBS ASSESS the need for LABS as needed   Critical care provider statement:    Critical care time (minutes):  32   Critical care time was exclusive of:  Separately billable procedures and treating other patients   Critical care was necessary to treat or  prevent imminent or life-threatening deterioration of the following conditions:   Acute hypoxemic respiratory failure due to aspiration pneumonia, alcoholic encephalopathy, multiple metabolic derangements, dysphagia, coagulation negative staph bacteremia, transaminitis, upper GI bleeding due to erosive esophagitis, multiple comorbid conditions   Critical care was time spent personally by me on the following activities:  Development of treatment plan with patient or surrogate, discussions with consultants, evaluation of patient's response to treatment, examination of patient, obtaining history from patient or surrogate, ordering and performing treatments and interventions, ordering and review of laboratory studies and re-evaluation of patient's condition.  I assumed direction of critical care for this patient from another provider in my specialty: no    This document was prepared using Dragon voice recognition software and may include unintentional dictation errors.    Ottie Glazier, M.D.  Division of [place]

## 2019-04-12 ENCOUNTER — Inpatient Hospital Stay: Payer: Self-pay

## 2019-04-12 LAB — CBC
HCT: 28.1 % — ABNORMAL LOW (ref 39.0–52.0)
Hemoglobin: 7.7 g/dL — ABNORMAL LOW (ref 13.0–17.0)
MCH: 22.8 pg — ABNORMAL LOW (ref 26.0–34.0)
MCHC: 27.4 g/dL — ABNORMAL LOW (ref 30.0–36.0)
MCV: 83.1 fL (ref 80.0–100.0)
Platelets: 370 10*3/uL (ref 150–400)
RBC: 3.38 MIL/uL — ABNORMAL LOW (ref 4.22–5.81)
WBC: 12.4 10*3/uL — ABNORMAL HIGH (ref 4.0–10.5)
nRBC: 0 % (ref 0.0–0.2)

## 2019-04-12 LAB — BASIC METABOLIC PANEL
Anion gap: 7 (ref 5–15)
BUN: <5 mg/dL — ABNORMAL LOW (ref 8–23)
CO2: 21 mmol/L — ABNORMAL LOW (ref 22–32)
Calcium: 7.6 mg/dL — ABNORMAL LOW (ref 8.9–10.3)
Chloride: 112 mmol/L — ABNORMAL HIGH (ref 98–111)
Creatinine, Ser: 0.49 mg/dL — ABNORMAL LOW (ref 0.61–1.24)
GFR calc Af Amer: >60 mL/min (ref >60)
GFR calc non Af Amer: >60 mL/min (ref >60)
Glucose, Bld: 120 mg/dL — ABNORMAL HIGH (ref 70–99)
Potassium: 3.6 mmol/L (ref 3.5–5.1)
Sodium: 140 mmol/L (ref 135–145)

## 2019-04-12 LAB — PROCALCITONIN: Procalcitonin: 0.54 ng/mL

## 2019-04-12 LAB — MAGNESIUM: Magnesium: 2 mg/dL (ref 1.7–2.4)

## 2019-04-12 LAB — PHOSPHORUS: Phosphorus: 2.2 mg/dL — ABNORMAL LOW (ref 2.5–4.6)

## 2019-04-12 NOTE — Progress Notes (Signed)
ID  Subjective Says he is feeing better  Objective Awake and alert, no distress Patient Vitals for the past 24 hrs:  BP Temp Temp src Pulse Resp SpO2  04/12/19 1458 100/75 99 F (37.2 C) Oral 100 16 90 %  04/12/19 0617 (!) 154/91 99 F (37.2 C) Oral (!) 102 18 92 %  04/11/19 2120 (!) 141/93 - - 97 - -  04/11/19 1849 118/85 99.5 F (37.5 C) Oral (!) 105 20 92 %    CHEST-b/l air entry- few crepts  HS: s1s2  ABD: not examined  Abrasion on the knees healing  LABS CBC Latest Ref Rng & Units 04/12/2019 04/11/2019 04/09/2019  WBC 4.0 - 10.5 K/uL 12.4(H) 15.2(H) 16.0(H)  Hemoglobin 13.0 - 17.0 g/dL 7.7(L) 7.4(L) 7.5(L)  Hematocrit 39.0 - 52.0 % 28.1(L) 26.7(L) 26.9(L)  Platelets 150 - 400 K/uL 370 342 313    CMP Latest Ref Rng & Units 04/12/2019 04/11/2019 04/10/2019  Glucose 70 - 99 mg/dL 120(H) 117(H) 154(H)  BUN 8 - 23 mg/dL <5(L) <5(L) 6(L)  Creatinine 0.61 - 1.24 mg/dL 0.49(L) 0.56(L) 0.50(L)  Sodium 135 - 145 mmol/L 140 140 144  Potassium 3.5 - 5.1 mmol/L 3.6 3.5 3.6  Chloride 98 - 111 mmol/L 112(H) 111 114(H)  CO2 22 - 32 mmol/L 21(L) 21(L) 23  Calcium 8.9 - 10.3 mg/dL 7.6(L) 7.0(L) 7.0(L)  Total Protein 6.5 - 8.1 g/dL - - -  Total Bilirubin 0.3 - 1.2 mg/dL - - -  Alkaline Phos 38 - 126 U/L - - -  AST 15 - 41 U/L - - -  ALT 0 - 44 U/L - - -   62 y.o.malewith a history ofalcohol abuse, hypertension, hypercholesterolemia, 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 ?  Multi lobar pneumonia due to aspiration-wason unasyn- changed to cefepime and flagylon 6/26 for worsening leucocytosis and new fevers , which improved but because of encephalopathy they were Dc and started ceftriaxone on 6/27 by intensivist. Until 04/13/19  Pt had scc of  tonsils ( rt ) and had radiation- ENT saw patient and ruled out local pathology for cause of aspiration  Encephalopathysecondary to alcohol abuse( wernicke's /korsakoff's psychosisand withdrawal syndrome-much improved   Staph epidermidis bacteremia- 3/4 bottle  likely source could be the skin abrasion on both knees. On vancomycin- 2 d echo done-no vegetation As repeat blood culture promptly negative dont suspect endocarditis, b/l knee TKA site okay except for superficial bruising Will  treat for total of 2 weeks- last dose on 04/13/19   Acute GI bleed- has a h/o erosive esophagitis- and gastric ulcer- on PPI  Anemia-due to above- received blood transfusion  Fall secondary to all of the above- rhabdomyolysis    Abnormal LFTS- AST>ALT-improving  Discussed withpatient and Dr.Pyreddy ID will sign off- call if needed

## 2019-04-12 NOTE — Progress Notes (Signed)
[name], male   DOB: Nov 17, 1955, 63 y.o.   MRN: [id number]  [employer] PROGRESS NOTE  George Wade JME:[id number] DOB: [birth year] DOA: 03/31/2019 PCP: Patient, [name] Per  HPI/Subjective: Patient currently transferred to medical floor On oxygen via nasal cannula No fever No chills Has generalized weakness Objective: Vitals:   04/11/19 2120 04/12/19 0617  BP: (!) 141/93 (!) 154/91  Pulse: 97 (!) 102  Resp:  18  Temp:  99 F (37.2 C)  SpO2:  92%    Filed Weights   03/31/19 1912 04/07/19 0906 04/08/19 0435  Weight: 78 kg 82.3 kg 83.8 kg    ROS: Review of Systems  Constitutional: Negative for chills and fever.  HENT: Negative for nosebleeds and tinnitus.   Eyes: Negative for blurred vision.  Respiratory: Positive for cough. Negative for shortness of breath.   Cardiovascular: Negative for palpitations.  Gastrointestinal: Negative for abdominal pain.  Genitourinary: Negative for dysuria and frequency.  Skin: Negative for itching.  Neurological: Negative for dizziness, tremors and weakness.  Psychiatric/Behavioral: Negative for suicidal ideas. The patient is not nervous/anxious.    Exam: Physical Exam  HENT:  Nose: No mucosal edema.  Mouth/Throat: No oropharyngeal exudate or posterior oropharyngeal edema.  Eyes: Pupils are equal, round, and reactive to light. Conjunctivae, EOM and lids are normal.  Neck: No JVD present. Carotid bruit is not present. No edema present. No thyroid mass and no thyromegaly present.  Cardiovascular: S1 normal and S2 normal. Exam reveals no gallop.  No murmur heard. Pulses:      Dorsalis pedis pulses are 2+ on the right side and 2+ on the left side.  Respiratory: No respiratory distress. He has decreased breath sounds in the right lower field and the left lower field. He has no wheezes. He has rhonchi in the right lower field and the left lower field. He has no rales.  Positive crackles bilaterally, diminished breath  sounds  GI: Soft. Bowel sounds are normal. There is no abdominal tenderness.  Musculoskeletal:     Right ankle: He exhibits swelling.     Left ankle: He exhibits swelling.  Lymphadenopathy:    He has no cervical adenopathy.  Neurological: He is alert.  Skin: Skin is warm. Nails show no clubbing.  Skin abrasions bilateral knees.  Psychiatric: His affect is blunt.      Data Reviewed: Basic Metabolic Panel: Recent Labs  Lab 04/07/19 0458 04/08/19 0315 04/09/19 0414 04/09/19 2153 04/10/19 0513 04/11/19 0407 04/12/19 0423  NA 139 140 138  --  144 140 140  K 3.7 3.6 3.3* 3.4* 3.6 3.5 3.6  CL 110 108 106  --  114* 111 112*  CO2 21* 24 23  --  23 21* 21*  GLUCOSE 97 94 144*  --  154* 117* 120*  BUN 9 10 8   --  6* <5* <5*  CREATININE 0.47* 0.43* 0.37*  --  0.50* 0.56* 0.49*  CALCIUM 6.9* 7.0* 7.1*  --  7.0* 7.0* 7.6*  MG 2.0 2.3 2.0  --   --  2.0 2.0  PHOS 2.2* 1.6* <1.0* 2.2* 2.2* 1.4* 2.2*   Liver Function Tests: Recent Labs  Lab 04/07/19 0458 04/10/19 0513  AST 80*  --   ALT 76*  --   ALKPHOS 213*  --   BILITOT 3.6*  --   PROT 5.0*  --   ALBUMIN 2.0* 2.0*   CBC: Recent Labs  Lab 04/07/19 0458 04/07/19 1720 04/09/19 0414 04/11/19 0407 04/12/19 0423  WBC 12.7* 11.7* 16.0* 15.2* 12.4*  HGB 7.1* 7.8* 7.5* 7.4* 7.7*  HCT 25.1* 29.1* 26.9* 26.7* 28.1*  MCV 80.4 83.6 80.1 82.4 83.1  PLT 282 277 313 342 370   Cardiac Enzymes: No results for input(s): CKTOTAL, CKMB, CKMBINDEX, TROPONINI in the last 168 hours.  CBG: Recent Labs  Lab 04/07/19 0901  GLUCAP 87    Recent Results (from the past 240 hour(s))  CULTURE, BLOOD (ROUTINE X 2) w Reflex to ID Panel     Status: None   Collection Time: 04/02/19  3:05 PM   Specimen: BLOOD RIGHT WRIST  Result Value Ref Range Status   Specimen Description BLOOD RIGHT WRIST  Final   Special Requests NONE  Final   Culture   Final    NO GROWTH 5 DAYS Performed at [facility], [address] [place],  [place] [address]    Report Status 04/07/2019 FINAL  Final  Culture, blood (Routine X 2) w Reflex to ID Panel     Status: None   Collection Time: 04/02/19 10:45 PM   Specimen: BLOOD  Result Value Ref Range Status   Specimen Description BLOOD RIGHT ASSIST CONTROL  Final   Special Requests   Final    BOTTLES DRAWN AEROBIC AND ANAEROBIC Blood Culture adequate volume   Culture   Final    NO GROWTH 5 DAYS Performed at [facility], [address] [place], [place] [address]    Report Status 04/07/2019 FINAL  Final  CULTURE, BLOOD (ROUTINE X 2) w Reflex to ID Panel     Status: None   Collection Time: 04/05/19  1:33 PM   Specimen: BLOOD RIGHT ARM  Result Value Ref Range Status   Specimen Description BLOOD RIGHT ARM  Final   Special Requests   Final    BOTTLES DRAWN AEROBIC AND ANAEROBIC Blood Culture results may not be optimal due to an excessive volume of blood received in culture bottles   Culture   Final    NO GROWTH 5 DAYS Performed at [facility], [address] [place], [place] [address]    Report Status 04/10/2019 FINAL  Final  CULTURE, BLOOD (ROUTINE X 2) w Reflex to ID Panel     Status: None   Collection Time: 04/05/19  1:42 PM   Specimen: BLOOD LEFT ARM  Result Value Ref Range Status   Specimen Description BLOOD LEFT ARM  Final   Special Requests   Final    BOTTLES DRAWN AEROBIC AND ANAEROBIC Blood Culture results may not be optimal due to an excessive volume of blood received in culture bottles   Culture   Final    NO GROWTH 5 DAYS Performed at [facility], [address] [place], [place] [address]    Report Status 04/10/2019 FINAL  Final  MRSA PCR Screening     Status: None   Collection Time: 04/07/19  9:08 AM   Specimen: Nasopharyngeal  Result Value Ref Range Status   MRSA by PCR NEGATIVE NEGATIVE Final    Comment:        The GeneXpert MRSA Assay (FDA approved for NASAL specimens only), is one component of a comprehensive MRSA  colonization surveillance program. It is not intended to diagnose MRSA infection nor to guide or monitor treatment for MRSA infections. Performed at [facility], [address], [place], [place] [address]      Studies: No results found.  Scheduled Meds: . cloNIDine  0.1 mg Oral TID  .  diazepam  5 mg Oral J2E  . folic acid  1 mg Oral Daily  . mouth rinse  15 mL Mouth Rinse BID  . multivitamin with minerals  1 tablet Oral Daily  . niacin  500 mg Oral QHS  . pantoprazole (PROTONIX) IV  40 mg Intravenous Q12H  . potassium phosphate (monobasic)  500 mg Oral TID WC & HS  . sodium chloride flush  3 mL Intravenous Q12H  . thiamine  100 mg Oral Daily  . vitamin C  500 mg Oral BID   Continuous Infusions: . sodium chloride 250 mL (04/12/19 1036)  . cefTRIAXone (ROCEPHIN)  IV 2 g (04/12/19 1037)  . vancomycin 1,500 mg (04/11/19 2108)    Assessment/Plan:  1. Acute respiratory failure with hypoxia from multifocal pneumonia-healthcare associated pneumonia.  Ongoing aspiration, clinically improving.  Off BiPAP.  Off  high flow oxygen, wean off oxygen as tolerated on vancomycin and intensivist added Rocephin as patient was already on Unasyn, bronchodilator treatments as needed.  Discussed with intensivist and ID 2. Staph epidermidis bacteremia on vancomycin.  Echocardiogram negative.  Spoke with cardiology and they do not want to do a TEE.   infectious disease specialist and recommending  2 to 4 weeks of IV antibiotics.  Continue to watch temperature curve.   3. Discussed with infectious disease attending.  Tomorrow will be last dose of IV vancomycin antibiotic. 4. Aspiration pneumonia.  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  Scar tissue ID is recommending ENT evaluation. repeat chest x-ray showed worsening aspiration.  Seen by Dr. Tami Ribas from ENT, no anatomical deformity was detected which could cause aspiration.   Recommending modified barium swallow or FEES study done by speech therapy if aspiration is a concern appreciate their recommendations,  will f/u with ID  5. Acute blood loss anemia with GI bleed.  Recent endoscopy with peptic ulcer disease and gastritis.  On Protonix. 6. Hypokalemia and hypophosphatemia.  Replete and recheck 7. Acute rhabdomyolysis improved with IV fluids 8. Alcohol withdrawal with hallucinations on alcohol withdrawal protocol.  Nurse asking for some standing dose Ativan.  Ammonia level 18. 9. Hypertension on Norvasc 10. Generalized weakness PT consult appreciated.  Recommended SNF placement.  Will discuss with patient whether he wants to go to SNF or home after completion of IV antibiotics.   Code Status: Full code    Code Status Orders  (From admission, onward)         Start     Ordered   03/31/19 2341  Full code  Continuous     03/31/19 2340        Code Status History    Date Active Date Inactive Code Status Order ID Comments User Context   12/09/2017 1336 12/12/2017 1845 Full Code [id number]  Dustin Flock, MD Inpatient   Advance Care Planning Activity     Family Communication: Spoke with the patient's friend Minda Ditto.  He endorsed that patient does not have any family members who is willing to take care of him and also he refused to be his healthcare power of attorney as it is overwhelming .  Will follow up with social worker Disposition Plan: No plan at this point in time  Consultants:  Infectious disease  Intensivist  Antibiotics: Vancomycin Rocephin Total time taken and seen the patient 94 minutes  [place]

## 2019-04-12 NOTE — Progress Notes (Signed)
PT Cancellation Note  Patient Details Name: George Wade MRN: [id number] DOB: 1956/05/05   Cancelled Treatment:    Reason Eval/Treat Not Completed: Fatigue/lethargy limiting ability to participate;Patient declined, no reason specified. Treatment attempted; pt declined due to fatigue/lethargy noting that he has not had any sleep the past two nights. Pt states he is just not up to it today. States he will try tomorrow. Re attempt tomorrow.    Larae Grooms, PTA 04/12/2019, 2:33 PM

## 2019-04-12 NOTE — Evaluation (Addendum)
Objective Swallowing Evaluation: Type of Study: MBS-Modified Barium Swallow Study   Patient Details  Name: George Wade MRN: [id number] Date of Birth: 08/22/1956  Today's Date: 04/12/2019 Time: SLP Start Time (ACUTE ONLY): 0840 -SLP Stop Time (ACUTE ONLY): 0915  SLP Time Calculation (min) (ACUTE ONLY): 35 min   Past Medical History:  Past Medical History:  Diagnosis Date  . ED (erectile dysfunction)   . Hypercholesteremia   . Hypertension   . Lung cancer [facility])    Past Surgical History:  Past Surgical History:  Procedure Laterality Date  . HERNIA REPAIR  1958  . REPLACEMENT TOTAL KNEE BILATERAL  2009  . TONSILLECTOMY  2008   HPI: Per admitting H&P: "Pt is a 63 y.o. male who has a history of daily alcohol drinking and alcohol withdrawal, seizure in past who states he has been having diarrhea for the last month and dark vomit since yesterday. 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  This admission, he was brought to the emergency room by EMS services after being found outside his home.  Patient reports having laid in the rain for approximately 4 hours prior to EMS services being called by his neighbor.  He reports becoming progressively weak over the last month with diarrhea as well as nausea and vomiting over the last 2 days.  Pt seemed more congested since his arrival. Coughing and then spits out rust colored sputum.  On review of patient's prior records, he has a history of GI bleed with EGD performed at [facility] on 09/29/2018 finding severe ulcerating esophagitis of the entire mid and distal esophagus. There was no evidence of varices or active esophageal bleeding.  Currently, GI is following but unable to do endoscopy at this time secondary to pt's illness/status.  Pt  has been more somnolent per MD notes. "   Subjective: Pt was alert, pleasant and cooperative; however pleasantly confused.    Assessment / Plan / Recommendation  CHL IP CLINICAL IMPRESSIONS 04/12/2019  Clinical Impression This 63 y/o male presents with mild oral phase, mod-severe pharyngoesophageal phase dysphagia. Pharyngeal phase c/b delay in initiation of swallow for all consistencies tested (thin, nectar and honey thick liquid, puree, and soft solid), with swallow triggering as boluses spilled from valleculae to pyriform sinuses resulting in [place] that largely did not clear with small sips nectar thick liquids. In addition, observed retrograde flow of soft solid from cervical esophagus to pyriform sinuses which was SILENTLY ASPIRATED during next swallow. Pt w/known hx of severe ulcerating esophagitis full length of mid to distal esophagus likely contributing to dysphagia. Pt is at a SEVERE risk of aspiration due to severity of swallow delay, esophageal dysmotility, and decreased laryngeal sensation and subsequent lack of airway protection. Improved oral control and pharygoesophageal clearance observed with puree and honey thick liquids by cup with NO ASPIRATION or laryngeal penetration.  Rec. Dysphagia I (puree) diet with Honey Thick liquids by cup, with STRICT ADHERENCE to following safe swallow rec's: SMALL SINGLE SIPS thickened liquid alternated with small TSP's puree, position pt fully upright for ALL PO intake, give meds crushed in puree (pt was unable to propel whole Ba tablet posteriorlly given in puree ), SMALL more frequent meals as opposed to 3 large meals per day. Pt will need supervision with all PO intake to reinforce and ensure  adherence to diet and swallow recommendations as pt presents with decreased cognition and safety awareness due to long standing hx of EtoH abuse. May aslo consider GI consult for further assessment of esophageal dysphagia. SLP to f/u with  patient/nsg education re: MBSS results, safe swallow recommendations and aspiration precautions.  SLP Visit Diagnosis Dysphagia, oropharyngeal phase (R13.12);Dysphagia, pharyngoesophageal phase (R13.14)  Attention and concentration deficit following --  Frontal lobe and executive function deficit following --  Impact on safety and function Severe aspiration risk;Risk for inadequate nutrition/hydration      CHL IP TREATMENT RECOMMENDATION 04/12/2019  Treatment Recommendations Therapy as outlined in treatment plan below     Prognosis 04/12/2019  Prognosis for Safe Diet Advancement Guarded  Barriers to Reach Goals Cognitive deficits;Time post onset;Severity of deficits  Barriers/Prognosis Comment --    CHL IP DIET RECOMMENDATION 04/12/2019  SLP Diet Recommendations Dysphagia 1 (Puree) solids;Honey thick liquids  Liquid Administration via Cup  Medication Administration Crushed with puree  Compensations Minimize environmental distractions;Slow rate;Small sips/bites;Follow solids with liquid  Postural Changes Remain semi-upright after after feeds/meals (Comment);Seated upright at 90 degrees      CHL IP OTHER RECOMMENDATIONS 04/12/2019  Recommended Consults Consider GI evaluation;Consider esophageal assessment  Oral Care Recommendations Oral care QID  Other Recommendations Order thickener from pharmacy;Prohibited food (jello, ice cream, thin soups);Remove water pitcher;Clarify dietary restrictions      CHL IP FOLLOW UP RECOMMENDATIONS 04/12/2019  Follow up Recommendations Home health SLP;Skilled Nursing facility      CHL IP FREQUENCY AND DURATION 04/12/2019  Speech Therapy Frequency (ACUTE ONLY) min 1 x/week  Treatment Duration 1 week           CHL IP ORAL PHASE 04/12/2019  Oral Phase Impaired  Oral - Pudding Teaspoon --  Oral - Pudding Cup --  Oral - Honey Teaspoon --  Oral - Honey Cup --  Oral - Nectar Teaspoon --  Oral - Nectar Cup --  Oral - Nectar Straw --  Oral - Thin Teaspoon --   Oral - Thin Cup --  Oral - Thin Straw --  Oral - Puree --  Oral - Mech Soft --  Oral - Regular --  Oral - Multi-Consistency --  Oral - Pill --1 (able to propel posteriorly in puree)  Oral Phase - Comment Mild to mod A-P transit delay, piecemeal deglutition    CHL IP PHARYNGEAL PHASE 04/12/2019  Pharyngeal Phase Impaired  Pharyngeal- Pudding Teaspoon --  Pharyngeal --  Pharyngeal- Pudding Cup --  Pharyngeal --  Pharyngeal- Honey Teaspoon --1  Pharyngeal --  Pharyngeal- Honey Cup --2  Pharyngeal --  Pharyngeal- Nectar Teaspoon --2  Pharyngeal --  Pharyngeal- Nectar Cup --2  Pharyngeal --  Pharyngeal- Nectar Straw --  Pharyngeal --  Pharyngeal- Thin Teaspoon --  Pharyngeal --  Pharyngeal- Thin Cup --2  Pharyngeal --  Pharyngeal- Thin Straw --  Pharyngeal --  Pharyngeal- Puree --3  Pharyngeal --  Pharyngeal- Mechanical Soft --1  Pharyngeal --  Pharyngeal- Regular --  Pharyngeal --  Pharyngeal- Multi-consistency --  Pharyngeal --  Pharyngeal- Pill   Pharyngeal --  Pharyngeal Comment Swallow initiation delayed for all consistencies, triggering as bolus spilled from valleculae to pyriform sinuses, resulting in silent laryngeal penetration with Nectar thick liquids by cup which did not clear with f/u cough.     CHL IP CERVICAL ESOPHAGEAL PHASE 04/12/2019  Cervical Esophageal Phase Impaired  Pudding Teaspoon --  Pudding Cup --  Honey Teaspoon --  Honey Cup --  Nectar Teaspoon --  Nectar Cup --  [employer] --  Thin Teaspoon --  Thin Cup --  Thin Straw --  Puree --  Mechanical Soft --1  Regular --  Multi-consistency --  Pill --  Cervical Esophageal Comment Obsreved retrograde flow from cervical esophagus to pyriform sinuses with tsp soft solid which was SILENTLY ASPIRATED during next swallow, no amount was cleared with cues for f/u swallow.     Jaisen Wiltrout , MA, CCC-SLP 04/12/2019, 12:35 PM

## 2019-04-12 NOTE — Progress Notes (Signed)
Pharmacy Electrolyte Monitoring Consult:  Pharmacy consulted to assist in monitoring and replacing electrolytes in this 63 y.o. male admitted on 03/31/2019 for GI Bleed. Patient admitted to ICU fore severe alcohol withdrawal and alcoholic encephalopathy.   Labs:  Sodium (mmol/L)  Date Value  04/12/2019 140   Potassium (mmol/L)  Date Value  04/12/2019 3.6   Magnesium (mg/dL)  Date Value  04/12/2019 2.0   Phosphorus (mg/dL)  Date Value  04/12/2019 2.2 (L)   Calcium (mg/dL)  Date Value  04/12/2019 7.6 (L)   Albumin (g/dL)  Date Value  04/10/2019 2.0 (L)   Corrected Calcium: 8.6   Assessment/Plan: K and Mg WNL, Phos slightly low. Will continue potassium phosphate 500mg  PO TIDAC + HS.   Will obtain follow up labs in am.   Will replace for goal electrolytes WNL.   Pharmacy will continue to monitor and adjust per consult.   Tawnya Crook, PharmD Clinical Pharmacist 04/12/2019 10:08 AM

## 2019-04-13 LAB — CBC
HCT: 26.1 % — ABNORMAL LOW (ref 39.0–52.0)
Hemoglobin: 7 g/dL — ABNORMAL LOW (ref 13.0–17.0)
MCH: 22.1 pg — ABNORMAL LOW (ref 26.0–34.0)
MCHC: 26.8 g/dL — ABNORMAL LOW (ref 30.0–36.0)
MCV: 82.3 fL (ref 80.0–100.0)
Platelets: 359 10*3/uL (ref 150–400)
RBC: 3.17 MIL/uL — ABNORMAL LOW (ref 4.22–5.81)
RDW: 28.2 % — ABNORMAL HIGH (ref 11.5–15.5)
WBC: 10.6 10*3/uL — ABNORMAL HIGH (ref 4.0–10.5)
nRBC: 0 % (ref 0.0–0.2)

## 2019-04-13 LAB — PREPARE RBC (CROSSMATCH)

## 2019-04-13 LAB — MAGNESIUM: Magnesium: 2.2 mg/dL (ref 1.7–2.4)

## 2019-04-13 LAB — BASIC METABOLIC PANEL
Anion gap: 5 (ref 5–15)
BUN: 7 mg/dL — ABNORMAL LOW (ref 8–23)
CO2: 22 mmol/L (ref 22–32)
Calcium: 7.8 mg/dL — ABNORMAL LOW (ref 8.9–10.3)
Chloride: 113 mmol/L — ABNORMAL HIGH (ref 98–111)
Creatinine, Ser: 0.57 mg/dL — ABNORMAL LOW (ref 0.61–1.24)
GFR calc Af Amer: >60 mL/min (ref >60)
GFR calc non Af Amer: >60 mL/min (ref >60)
Glucose, Bld: 138 mg/dL — ABNORMAL HIGH (ref 70–99)
Potassium: 3.8 mmol/L (ref 3.5–5.1)
Sodium: 140 mmol/L (ref 135–145)

## 2019-04-13 LAB — PHOSPHORUS: Phosphorus: 2.8 mg/dL (ref 2.5–4.6)

## 2019-04-13 MED ORDER — SODIUM CHLORIDE 0.9% IV SOLUTION
Freq: Once | INTRAVENOUS | Status: AC
  Administered 2019-04-13: 13:00:00 via INTRAVENOUS

## 2019-04-13 MED ORDER — VANCOMYCIN HCL 10 G IV SOLR
1500.0000 mg | Freq: Two times a day (BID) | INTRAVENOUS | Status: AC
  Administered 2019-04-13 (×2): 1500 mg via INTRAVENOUS
  Filled 2019-04-13 (×2): qty 1500

## 2019-04-13 NOTE — Progress Notes (Signed)
Pharmacy Electrolyte Monitoring Consult:  Pharmacy consulted to assist in monitoring and replacing electrolytes in this 63 y.o. male admitted on 03/31/2019 for GI Bleed. Patient admitted to ICU fore severe alcohol withdrawal and alcoholic encephalopathy.   Labs:  Sodium (mmol/L)  Date Value  04/13/2019 140   Potassium (mmol/L)  Date Value  04/13/2019 3.8   Magnesium (mg/dL)  Date Value  04/13/2019 2.2   Phosphorus (mg/dL)  Date Value  04/13/2019 2.8   Calcium (mg/dL)  Date Value  04/13/2019 7.8 (L)   Albumin (g/dL)  Date Value  04/10/2019 2.0 (L)   Corrected Calcium: 8.6   Assessment/Plan: K, Mg, Phos WNL. Will continue potassium phosphate 500mg  PO TIDAC + HS.   Will follow up labs in the morning. If continue normal, will sign off on electrolyte consult.    Pharmacy will continue to monitor and adjust per consult.   Tawnya Crook, PharmD Clinical Pharmacist 04/13/2019 10:34 AM

## 2019-04-13 NOTE — Progress Notes (Signed)
[name], male   DOB: 1955-10-29, 63 y.o.   MRN: [id number]  [employer] PROGRESS NOTE  Davonne Jarnigan NFA:[id number] DOB: [birth year] DOA: 03/31/2019 PCP: Patient, [name] Per  HPI/Subjective: Patient currently transferred to medical floor On oxygen via nasal cannula No chills Has generalized weakness No new episodes of fever Objective: Vitals:   04/12/19 2044 04/13/19 0551  BP: 113/82 127/80  Pulse: 84 92  Resp:    Temp: 98.8 F (37.1 C) 98 F (36.7 C)  SpO2: 95% 95%    Filed Weights   03/31/19 1912 04/07/19 0906 04/08/19 0435  Weight: 78 kg 82.3 kg 83.8 kg    ROS: Review of Systems  Constitutional: Negative for chills and fever.  HENT: Negative for nosebleeds and tinnitus.   Eyes: Negative for blurred vision.  Respiratory: Positive for cough. Negative for shortness of breath.   Cardiovascular: Negative for palpitations.  Gastrointestinal: Negative for abdominal pain.  Genitourinary: Negative for dysuria and frequency.  Skin: Negative for itching.  Neurological: Negative for dizziness, tremors and weakness.  Psychiatric/Behavioral: Negative for suicidal ideas. The patient is not nervous/anxious.    Exam: Physical Exam  HENT:  Nose: No mucosal edema.  Mouth/Throat: No oropharyngeal exudate or posterior oropharyngeal edema.  Eyes: Pupils are equal, round, and reactive to light. Conjunctivae, EOM and lids are normal.  Neck: No JVD present. Carotid bruit is not present. No edema present. No thyroid mass and no thyromegaly present.  Cardiovascular: S1 normal and S2 normal. Exam reveals no gallop.  No murmur heard. Pulses:      Dorsalis pedis pulses are 2+ on the right side and 2+ on the left side.  Respiratory: No respiratory distress. He has decreased breath sounds in the right lower field and the left lower field. He has no wheezes. He has rhonchi in the right lower field and the left lower field. He has no rales.  Positive crackles bilaterally,  diminished breath sounds  GI: Soft. Bowel sounds are normal. There is no abdominal tenderness.  Musculoskeletal:     Right ankle: He exhibits swelling.     Left ankle: He exhibits swelling.  Lymphadenopathy:    He has no cervical adenopathy.  Neurological: He is alert.  Skin: Skin is warm. Nails show no clubbing.  Skin abrasions bilateral knees.  Psychiatric: His affect is blunt.      Data Reviewed: Basic Metabolic Panel: Recent Labs  Lab 04/08/19 0315 04/09/19 0414 04/09/19 2153 04/10/19 0513 04/11/19 0407 04/12/19 0423 04/13/19 0605  NA 140 138  --  144 140 140 140  K 3.6 3.3* 3.4* 3.6 3.5 3.6 3.8  CL 108 106  --  114* 111 112* 113*  CO2 24 23  --  23 21* 21* 22  GLUCOSE 94 144*  --  154* 117* 120* 138*  BUN 10 8  --  6* <5* <5* 7*  CREATININE 0.43* 0.37*  --  0.50* 0.56* 0.49* 0.57*  CALCIUM 7.0* 7.1*  --  7.0* 7.0* 7.6* 7.8*  MG 2.3 2.0  --   --  2.0 2.0 2.2  PHOS 1.6* <1.0* 2.2* 2.2* 1.4* 2.2* 2.8   Liver Function Tests: Recent Labs  Lab 04/07/19 0458 04/10/19 0513  AST 80*  --   ALT 76*  --   ALKPHOS 213*  --   BILITOT 3.6*  --   PROT 5.0*  --   ALBUMIN 2.0* 2.0*   CBC: Recent Labs  Lab 04/07/19 1720 04/09/19 0414 04/11/19 0407 04/12/19 0423  04/13/19 0605  WBC 11.7* 16.0* 15.2* 12.4* 10.6*  HGB 7.8* 7.5* 7.4* 7.7* 7.0*  HCT 29.1* 26.9* 26.7* 28.1* 26.1*  MCV 83.6 80.1 82.4 83.1 82.3  PLT 277 313 342 370 359   Cardiac Enzymes: No results for input(s): CKTOTAL, CKMB, CKMBINDEX, TROPONINI in the last 168 hours.  CBG: Recent Labs  Lab 04/07/19 0901  GLUCAP 87    Recent Results (from the past 240 hour(s))  CULTURE, BLOOD (ROUTINE X 2) w Reflex to ID Panel     Status: None   Collection Time: 04/05/19  1:33 PM   Specimen: BLOOD RIGHT ARM  Result Value Ref Range Status   Specimen Description BLOOD RIGHT ARM  Final   Special Requests   Final    BOTTLES DRAWN AEROBIC AND ANAEROBIC Blood Culture results may not be optimal due to an excessive  volume of blood received in culture bottles   Culture   Final    NO GROWTH 5 DAYS Performed at [facility], [address] [place], [place] [address]    Report Status 04/10/2019 FINAL  Final  CULTURE, BLOOD (ROUTINE X 2) w Reflex to ID Panel     Status: None   Collection Time: 04/05/19  1:42 PM   Specimen: BLOOD LEFT ARM  Result Value Ref Range Status   Specimen Description BLOOD LEFT ARM  Final   Special Requests   Final    BOTTLES DRAWN AEROBIC AND ANAEROBIC Blood Culture results may not be optimal due to an excessive volume of blood received in culture bottles   Culture   Final    NO GROWTH 5 DAYS Performed at [facility], [address] [place], [place] [address]    Report Status 04/10/2019 FINAL  Final  MRSA PCR Screening     Status: None   Collection Time: 04/07/19  9:08 AM   Specimen: Nasopharyngeal  Result Value Ref Range Status   MRSA by PCR NEGATIVE NEGATIVE Final    Comment:        The GeneXpert MRSA Assay (FDA approved for NASAL specimens only), is one component of a comprehensive MRSA colonization surveillance program. It is not intended to diagnose MRSA infection nor to guide or monitor treatment for MRSA infections. Performed at [facility], [address] [place], [place] [address]      Studies: No results found.  Scheduled Meds: . sodium chloride   Intravenous Once  . cloNIDine  0.1 mg Oral TID  . diazepam  5 mg Oral Y0D  . folic acid  1 mg Oral Daily  . mouth rinse  15 mL Mouth Rinse BID  . multivitamin with minerals  1 tablet Oral Daily  . niacin  500 mg Oral QHS  . pantoprazole (PROTONIX) IV  40 mg Intravenous Q12H  . potassium phosphate (monobasic)  500 mg Oral TID WC & HS  . sodium chloride flush  3 mL Intravenous Q12H  . thiamine  100 mg Oral Daily  . vitamin C  500 mg Oral BID   Continuous Infusions: . sodium chloride Stopped (04/12/19 1337)  . cefTRIAXone (ROCEPHIN)  IV 2 g (04/13/19 1201)  .  vancomycin 1,500 mg (04/13/19 1053)    Assessment/Plan:  1. Acute respiratory failure with hypoxia from multifocal pneumonia-healthcare associated pneumonia.  Ongoing aspiration, clinically improving.  Off BiPAP.  Off  high flow oxygen, wean off oxygen as tolerated on vancomycin and intensivist added Rocephin as patient was already on Unasyn, bronchodilator treatments as needed.  Discussed with intensivist and ID 2. Discussed with speech therapy, patient had swallow study and on dysphagia diet 3. Staph epidermidis bacteremia on vancomycin.  Echocardiogram negative.  Spoke with cardiology and they do not want to do a TEE.   infectious disease specialist and recommending  2 to 4 weeks of IV antibiotics.  Continue to watch temperature curve.   4. Discussed with infectious disease attending.  Antibiotics course will be finished today. 5. Aspiration pneumonia.  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  Scar tissue ID is recommending ENT evaluation. repeat chest x-ray showed worsening aspiration.  Seen by Dr. Tami Ribas from ENT, no anatomical deformity was detected which could cause aspiration.  Recommending modified barium swallow or FEES study done by speech therapy if aspiration is a concern appreciate their recommendations,  will f/u with ID  6. Acute blood loss anemia with GI bleed.  Recent endoscopy with peptic ulcer disease and gastritis.  On Protonix.  Confused 1 unit PRBC IV today. 7. Hypokalemia and hypophosphatemia.  Replete and recheck 8. Acute rhabdomyolysis improved with IV fluids 9. Alcohol withdrawal with hallucinations on alcohol withdrawal protocol.  Nurse asking for some standing dose Ativan.  Ammonia level 18. 10. Hypertension on Norvasc 11. Generalized weakness PT consult appreciated.  Recommended SNF placement.  12. Social worker follow-up for SNF placement   Code Status: Full code    Code Status Orders  (From admission,  onward)         Start     Ordered   03/31/19 2341  Full code  Continuous     03/31/19 2340        Code Status History    Date Active Date Inactive Code Status Order ID Comments User Context   12/09/2017 1336 12/12/2017 1845 Full Code [id number]  Dustin Flock, MD Inpatient   Advance Care Planning Activity     Family Communication: Spoke with the patient's friend Minda Ditto.  He endorsed that patient does not have any family members who is willing to take care of him and also he refused to be his healthcare power of attorney as it is overwhelming .  Will follow up with social worker Disposition Plan: No plan at this point in time  Consultants:  Infectious disease  Intensivist  Antibiotics: Vancomycin Rocephin Total time taken and seen the patient 21 minutes  [place]

## 2019-04-13 NOTE — TOC Progression Note (Signed)
Transition of Care [facility]) - Progression Note    Patient Details  Name: George Wade MRN: [id number] Date of Birth: [birth year]  [facility] CM/SW Contact  Shelbie Hutching, RN Phone Number: 04/13/2019, 10:15 AM  Clinical Narrative:     Patient is awake and alert this morning but confused about the situation.  Patient reports that the MD came in this morning and told him he was released, this is not the case patient is still not medically ready for discharge.  Patient also reports that the nurses will not let him get out of bed and walk around or go to the restroom, RNCM explained that the patient is weak from being sick and the nurses are afraid he might fall without assistance.  Patient also reports that he is in excellent health but is in a bad mood because he has been here for 40 days.  Patient has only been admitted for 13 days.  RNCM discussed with bedside nurse that patient may benefit from a psychiatric consult.    Patient does not have a safe discharge plan, he cannot return to where he was living before.  RNCM will give patient resources for [facility] including information on [employer] and the [employer].  Patient reports that he receives a little over $1000/ month so he would be able to pay reasonable rent.  Patient does not have a PCP, resources for indigent clinics also presented to patient.  RNCM will cont to follow.    Expected Discharge Plan: [place] Barriers to Discharge: Continued Medical Work up  Expected Discharge Plan and Services Expected Discharge Plan: [place] In-house Referral: Clinical Social Work Discharge Planning Services: CM Consult   Living arrangements for the past 2 months: Single Family Home                                       Social Determinants of Health (SDOH) Interventions    Readmission Risk Interventions No flowsheet data found.

## 2019-04-13 NOTE — Progress Notes (Signed)
Physical Therapy Treatment Patient Details Name: George Wade MRN: [id number] DOB: 04-19-1956 Today's Date: 04/13/2019    History of Present Illness presented to ER secondary to progressive weakness, black/tarry stools and fall/unable to recover (outside in rain x4-5 hours); admitted for management of GIB, aspiration pna, encephalopathy and staph bacteremia. Stay complicated by CCU admission secondary to aspiration pneumonia. and alcohol withdrawl. Pt currently requesting cough medicine, RN notified.    PT Comments    Appears more confused this date compared to previous session. Concerned about discharge plan. Asks repeated questions. Has no carry over from previous session and limited memory of therapy working with him. Follows commands well and able to participate. Good endurance with there-ex, fatigues quickly with ambulation. Chair alarm applied. Will continue to progress.   Follow Up Recommendations  SNF     Equipment Recommendations  Rolling walker with 5" wheels    Recommendations for Other Services       Precautions / Restrictions Precautions Precautions: Fall Restrictions Weight Bearing Restrictions: No    Mobility  Bed Mobility Overal bed mobility: Needs Assistance Bed Mobility: Supine to Sit     Supine to sit: Min assist     General bed mobility comments: follows commands well. Needs assist for sliding B LE off bed. Once seated at EOB, able to sit with upright posture  Transfers Overall transfer level: Needs assistance Equipment used: Rolling walker (2 wheeled) Transfers: Sit to/from Stand Sit to Stand: Mod assist         General transfer comment: elevated bed. RW used. Cues for hand placement. ONce standing, able to stand with upright posture  Ambulation/Gait Ambulation/Gait assistance: Min assist Gait Distance (Feet): 5 Feet Assistive device: Rolling walker (2 wheeled) Gait Pattern/deviations: Step-to pattern     General Gait Details: shuffle  gait. Cues for sequencing. Follows commands well. RW, fatigues with increased distance   Stairs             Wheelchair Mobility    Modified Rankin (Stroke Patients Only)       Balance                                            Cognition Arousal/Alertness: Awake/alert Behavior During Therapy: WFL for tasks assessed/performed Overall Cognitive Status: Impaired/Different from baseline                                 General Comments: looking at computer talking about drawers that aren't there. Confused to situation/place      Exercises Other Exercises Other Exercises: supine ther-ex, needs cues for all sequencing. AP, quad sets, SLRs, SAQ, and LAQ. B LE x 12 reps with cga    General Comments        Pertinent Vitals/Pain Pain Assessment: No/denies pain    Home Living                      Prior Function            PT Goals (current goals can now be found in the care plan section) Acute Rehab PT Goals Patient Stated Goal: to walk again PT Goal Formulation: With patient Time For Goal Achievement: 04/25/19 Potential to Achieve Goals: Good Progress towards PT goals: Progressing toward goals    Frequency    Min 2X/week  PT Plan Current plan remains appropriate    Co-evaluation              AM-PAC PT "6 Clicks" Mobility   Outcome Measure  Help needed turning from your back to your side while in a flat bed without using bedrails?: A Little Help needed moving from lying on your back to sitting on the side of a flat bed without using bedrails?: A Little Help needed moving to and from a bed to a chair (including a wheelchair)?: A Little Help needed standing up from a chair using your arms (e.g., wheelchair or bedside chair)?: A Lot Help needed to walk in hospital room?: A Lot Help needed climbing 3-5 steps with a railing? : Total 6 Click Score: 14    End of Session Equipment Utilized During Treatment:  Gait belt Activity Tolerance: Patient tolerated treatment well Patient left: in chair;with chair alarm set Nurse Communication: Mobility status PT Visit Diagnosis: Muscle weakness (generalized) (M62.81);Difficulty in walking, not elsewhere classified (R26.2);History of falling (Z91.81);Unsteadiness on feet (R26.81)     Time: 2831-5176 PT Time Calculation (min) (ACUTE ONLY): 25 min  Charges:  $Gait Training: 8-22 mins $Therapeutic Exercise: 8-22 mins                     Greggory Stallion, PT, DPT [phone]    Lejla Moeser 04/13/2019, 11:32 AM

## 2019-04-14 LAB — TYPE AND SCREEN
ABO/RH(D): B POS
Antibody Screen: NEGATIVE
Unit division: 0

## 2019-04-14 LAB — BPAM RBC
Blood Product Expiration Date: 202007302359
ISSUE DATE / TIME: 202007031328
Unit Type and Rh: 7300

## 2019-04-14 LAB — POTASSIUM: Potassium: 3.6 mmol/L (ref 3.5–5.1)

## 2019-04-14 LAB — HEMOGLOBIN AND HEMATOCRIT, BLOOD
HCT: 30.4 % — ABNORMAL LOW (ref 39.0–52.0)
Hemoglobin: 8.7 g/dL — ABNORMAL LOW (ref 13.0–17.0)

## 2019-04-14 LAB — MAGNESIUM: Magnesium: 2.1 mg/dL (ref 1.7–2.4)

## 2019-04-14 LAB — PHOSPHORUS: Phosphorus: 3.1 mg/dL (ref 2.5–4.6)

## 2019-04-14 MED ORDER — QUETIAPINE FUMARATE 25 MG PO TABS
12.5000 mg | ORAL_TABLET | Freq: Two times a day (BID) | ORAL | Status: DC
  Administered 2019-04-14 – 2019-04-21 (×15): 12.5 mg via ORAL
  Filled 2019-04-14 (×15): qty 1

## 2019-04-14 MED ORDER — CLINDAMYCIN PHOSPHATE 900 MG/50ML IV SOLN
900.0000 mg | Freq: Four times a day (QID) | INTRAVENOUS | Status: AC
  Administered 2019-04-14 (×3): 900 mg via INTRAVENOUS
  Filled 2019-04-14 (×3): qty 50

## 2019-04-14 NOTE — Progress Notes (Signed)
Pt. presented with complaints of skin burning, itching and noticeably with skin discoloration. Denies shortness of breath or difficulty swallowing. IV abx stopped. Notified Gardiner Barefoot, NP about patients condition. Instructed to call pharmacy for d/c instructions and or replacement medication.

## 2019-04-14 NOTE — Progress Notes (Signed)
Pharmacy Electrolyte Monitoring Consult:  Pharmacy consulted to assist in monitoring and replacing electrolytes in this 63 y.o. male admitted on 03/31/2019 for GI Bleed. Patient admitted to ICU fore severe alcohol withdrawal and alcoholic encephalopathy.   Labs:  Sodium (mmol/L)  Date Value  04/13/2019 140   Potassium (mmol/L)  Date Value  04/14/2019 3.6   Magnesium (mg/dL)  Date Value  04/14/2019 2.1   Phosphorus (mg/dL)  Date Value  04/14/2019 3.1   Calcium (mg/dL)  Date Value  04/13/2019 7.8 (L)   Albumin (g/dL)  Date Value  04/10/2019 2.0 (L)   Corrected Calcium: 8.6   Assessment/Plan: K, Mg, Phos WNL. Will continue potassium phosphate 500mg  PO TIDAC + HS.   Electrolytes have continued to be consistently normal  Pharmacy will sign off on electrolyte consult.  Dallie Piles, PharmD Clinical Pharmacist 04/14/2019 9:19 AM

## 2019-04-14 NOTE — Progress Notes (Signed)
[name], male   DOB: Apr 21, 1956, 63 y.o.   MRN: [id number]  [employer] [facility] PROGRESS NOTE  George Wade PYP:[id number] DOB: 1956/05/16 DOA: 03/31/2019 PCP: Patient, [name] Per  HPI/Subjective: Patient currently transferred to medical floor On oxygen via nasal cannula No chills Has generalized weakness No new episodes of fever Tolerated prbc transfusion well Objective: Vitals:   04/13/19 2113 04/14/19 0452  BP: (!) 141/92 137/90  Pulse: 88 89  Resp: 20 16  Temp: 98.4 F (36.9 C) 98.6 F (37 C)  SpO2: 90% 94%    Filed Weights   03/31/19 1912 04/07/19 0906 04/08/19 0435  Weight: 78 kg 82.3 kg 83.8 kg    ROS: Review of Systems  Constitutional: Negative for chills and fever.  HENT: Negative for nosebleeds and tinnitus.   Eyes: Negative for blurred vision.  Respiratory: Positive for cough. Negative for shortness of breath.   Cardiovascular: Negative for palpitations.  Gastrointestinal: Negative for abdominal pain.  Genitourinary: Negative for dysuria and frequency.  Skin: Negative for itching.  Neurological: Negative for dizziness, tremors and weakness.  Psychiatric/Behavioral: Negative for suicidal ideas. The patient is not nervous/anxious.    Exam: Physical Exam  HENT:  Nose: No mucosal edema.  Mouth/Throat: No oropharyngeal exudate or posterior oropharyngeal edema.  Eyes: Pupils are equal, round, and reactive to light. Conjunctivae, EOM and lids are normal.  Neck: No JVD present. Carotid bruit is not present. No edema present. No thyroid mass and no thyromegaly present.  Cardiovascular: S1 normal and S2 normal. Exam reveals no gallop.  No murmur heard. Pulses:      Dorsalis pedis pulses are 2+ on the right side and 2+ on the left side.  Respiratory: No respiratory distress. He has decreased breath sounds in the right lower field and the left lower field. He has no wheezes. He has rhonchi in the right lower field and the left lower field. He has no  rales.  Positive crackles bilaterally, diminished breath sounds  GI: Soft. Bowel sounds are normal. There is no abdominal tenderness.  Musculoskeletal:     Right ankle: He exhibits swelling.     Left ankle: He exhibits swelling.  Lymphadenopathy:    He has no cervical adenopathy.  Neurological: He is alert.  Skin: Skin is warm. Nails show no clubbing.  Skin abrasions bilateral knees.  Psychiatric: His affect is blunt.      Data Reviewed: Basic Metabolic Panel: Recent Labs  Lab 04/09/19 0414  04/10/19 0513 04/11/19 0407 04/12/19 0423 04/13/19 0605 04/14/19 0550  NA 138  --  144 140 140 140  --   K 3.3*   < > 3.6 3.5 3.6 3.8 3.6  CL 106  --  114* 111 112* 113*  --   CO2 23  --  23 21* 21* 22  --   GLUCOSE 144*  --  154* 117* 120* 138*  --   BUN 8  --  6* <5* <5* 7*  --   CREATININE 0.37*  --  0.50* 0.56* 0.49* 0.57*  --   CALCIUM 7.1*  --  7.0* 7.0* 7.6* 7.8*  --   MG 2.0  --   --  2.0 2.0 2.2 2.1  PHOS <1.0*   < > 2.2* 1.4* 2.2* 2.8 3.1   < > = values in this interval not displayed.   Liver Function Tests: Recent Labs  Lab 04/10/19 0513  ALBUMIN 2.0*   CBC: Recent Labs  Lab 04/07/19 1720 04/09/19 0414 04/11/19 0407  04/12/19 0423 04/13/19 0605 04/14/19 0550  WBC 11.7* 16.0* 15.2* 12.4* 10.6*  --   HGB 7.8* 7.5* 7.4* 7.7* 7.0* 8.7*  HCT 29.1* 26.9* 26.7* 28.1* 26.1* 30.4*  MCV 83.6 80.1 82.4 83.1 82.3  --   PLT 277 313 342 370 359  --    Cardiac Enzymes: No results for input(s): CKTOTAL, CKMB, CKMBINDEX, TROPONINI in the last 168 hours.  CBG: No results for input(s): GLUCAP in the last 168 hours.  Recent Results (from the past 240 hour(s))  CULTURE, BLOOD (ROUTINE X 2) w Reflex to ID Panel     Status: None   Collection Time: 04/05/19  1:33 PM   Specimen: BLOOD RIGHT ARM  Result Value Ref Range Status   Specimen Description BLOOD RIGHT ARM  Final   Special Requests   Final    BOTTLES DRAWN AEROBIC AND ANAEROBIC Blood Culture results may not be  optimal due to an excessive volume of blood received in culture bottles   Culture   Final    NO GROWTH 5 DAYS Performed at [facility], [address] [place], [place] [address]    Report Status 04/10/2019 FINAL  Final  CULTURE, BLOOD (ROUTINE X 2) w Reflex to ID Panel     Status: None   Collection Time: 04/05/19  1:42 PM   Specimen: BLOOD LEFT ARM  Result Value Ref Range Status   Specimen Description BLOOD LEFT ARM  Final   Special Requests   Final    BOTTLES DRAWN AEROBIC AND ANAEROBIC Blood Culture results may not be optimal due to an excessive volume of blood received in culture bottles   Culture   Final    NO GROWTH 5 DAYS Performed at [facility], [address] [place], [place] [address]    Report Status 04/10/2019 FINAL  Final  MRSA PCR Screening     Status: None   Collection Time: 04/07/19  9:08 AM   Specimen: Nasopharyngeal  Result Value Ref Range Status   MRSA by PCR NEGATIVE NEGATIVE Final    Comment:        The GeneXpert MRSA Assay (FDA approved for NASAL specimens only), is one component of a comprehensive MRSA colonization surveillance program. It is not intended to diagnose MRSA infection nor to guide or monitor treatment for MRSA infections. Performed at [facility], [address], [place],  [address]      Studies: No results found.  Scheduled Meds: . cloNIDine  0.1 mg Oral TID  . diazepam  5 mg Oral W2O  . folic acid  1 mg Oral Daily  . mouth rinse  15 mL Mouth Rinse BID  . multivitamin with minerals  1 tablet Oral Daily  . niacin  500 mg Oral QHS  . pantoprazole (PROTONIX) IV  40 mg Intravenous Q12H  . potassium phosphate (monobasic)  500 mg Oral TID WC & HS  . QUEtiapine  12.5 mg Oral BID  . sodium chloride flush  3 mL Intravenous Q12H  . thiamine  100 mg Oral Daily  . vitamin C  500 mg Oral BID   Continuous Infusions: . sodium chloride 250 mL (04/13/19 1308)  . clindamycin (CLEOCIN) IV 900 mg  (04/14/19 3785)    Assessment/Plan:  1. Acute respiratory failure with hypoxia from multifocal pneumonia-healthcare associated pneumonia improved.  Ongoing aspiration, clinically improving.  Off BiPAP.  Off  high flow oxygen, wean off oxygen as tolerated on vancomycin and intensivist added Rocephin as patient was  already on Unasyn, bronchodilator treatments as needed.  Discussed with intensivist and ID 2. Discussed with speech therapy, patient had swallow study and on dysphagia diet 3. Staph epidermidis bacteremia on vancomycin.  Echocardiogram negative.  Spoke with cardiology and they do not want to do a TEE.   infectious disease specialist and recommending  2 to 4 weeks of IV antibiotics.  Continue to watch temperature curve.   4. Discussed with infectious disease attending.  Antibiotics course will be finished today. 5. Aspiration pneumonia.  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  Scar tissue ID is recommending ENT evaluation. repeat chest x-ray showed worsening aspiration.  Seen by Dr. Tami Ribas from ENT, no anatomical deformity was detected which could cause aspiration.  Recommending modified barium swallow or FEES study done by speech therapy if aspiration is a concern appreciate their recommendations,  will f/u with ID  6. Acute blood loss anemia with GI bleed.  Recent endoscopy with peptic ulcer disease and gastritis.  On Protonix.  Confused 1 unit PRBC IV today.Monitor Hb and hct 7. Hypokalemia and hypophosphatemia.  Replete and recheck 8. Acute rhabdomyolysis improved with IV fluids 9. Alcohol withdrawal with hallucinations on alcohol withdrawal protocol.  Nurse asking for some standing dose Ativan.  Ammonia level 18. 10. Hypertension on Norvasc 11. Generalized weakness PT consult appreciated.  Recommended SNF placement.  12. Social worker follow-up for SNF placement ongoing 13. Patient says he cant take care of himself at home, PT  recommends SNF   Code Status: Full code    Code Status Orders  (From admission, onward)         Start     Ordered   03/31/19 2341  Full code  Continuous     03/31/19 2340        Code Status History    Date Active Date Inactive Code Status Order ID Comments User Context   12/09/2017 1336 12/12/2017 1845 Full Code [id number]  Dustin Flock, MD Inpatient   Advance Care Planning Activity     Family Communication: Spoke with the patient's friend Minda Ditto.  He endorsed that patient does not have any family members who is willing to take care of him and also he refused to be his healthcare power of attorney as it is overwhelming .  Will follow up with social worker Disposition Plan: No plan at this point in time  Consultants:  Infectious disease  Intensivist  Antibiotics: Vancomycin Rocephin Total time taken and seen the patient 24 minutes  [place]

## 2019-04-15 DIAGNOSIS — E512 Wernicke's encephalopathy: Secondary | ICD-10-CM

## 2019-04-15 LAB — GLUCOSE, CAPILLARY: Glucose-Capillary: 152 mg/dL — ABNORMAL HIGH (ref 70–99)

## 2019-04-15 LAB — AMMONIA: Ammonia: 16 umol/L (ref 9–35)

## 2019-04-15 MED ORDER — PANTOPRAZOLE SODIUM 40 MG PO TBEC
40.0000 mg | DELAYED_RELEASE_TABLET | Freq: Two times a day (BID) | ORAL | Status: DC
  Administered 2019-04-15 – 2019-04-28 (×26): 40 mg via ORAL
  Filled 2019-04-15 (×26): qty 1

## 2019-04-15 MED ORDER — DIAZEPAM 5 MG PO TABS: 5.0000 mg | ORAL_TABLET | Freq: Two times a day (BID) | ORAL | Status: DC | PRN

## 2019-04-15 MED ORDER — LORAZEPAM 2 MG/ML IJ SOLN: 1.0000 mg | Freq: Four times a day (QID) | INTRAMUSCULAR | Status: DC | PRN

## 2019-04-15 NOTE — Progress Notes (Signed)
[name], male   DOB: 04-20-1956, 63 y.o.   MRN: [id number]  [employer] PROGRESS NOTE  George Wade ASN:[id number] DOB: 09-May-1956 DOA: 03/31/2019 PCP: Patient, [name] Per  HPI/Subjective: Patient currently transferred to medical floor On oxygen via nasal cannula No chills Has generalized weakness Patient has delusional thoughts . Has some hallucinations No new episodes of fever Tolerated prbc transfusion well 1 day ago Objective: Vitals:   04/15/19 0700 04/15/19 0750  BP: (!) 145/100 (!) 141/96  Pulse: 97   Resp:    Temp: 98.3 F (36.8 C)   SpO2: 94%     Filed Weights   03/31/19 1912 04/07/19 0906 04/08/19 0435  Weight: 78 kg 82.3 kg 83.8 kg    ROS: Review of Systems  Constitutional: Negative.   HENT: Negative.   Eyes: Negative.   Respiratory: Negative.   Cardiovascular: Negative.   Gastrointestinal: Negative.   Genitourinary: Negative.   Musculoskeletal: Negative.   Skin: Negative.   Neurological: Negative.   Endo/Heme/Allergies: Negative.   Psychiatric/Behavioral: Delusional thoughts and hallucinations   All other systems reviewed and are negative. Exam: GENERAL: Pleasant-appearing in no apparent distress.  HEAD, EYES, EARS, NOSE AND THROAT: Atraumatic, normocephalic. Extraocular muscles are intact. Pupils equal and reactive to light. Sclerae anicteric. No conjunctival injection. No oro-pharyngeal erythema.  NECK: Supple. There is no jugular venous distention. No bruits, no lymphadenopathy, no thyromegaly.  HEART: Regular rate and rhythm, tachycardic. No murmurs, no rubs, no clicks.  LUNGS: Clear to auscultation bilaterally. No rales or rhonchi. No wheezes.  ABDOMEN: Soft, flat, nontender, nondistended. Has good bowel sounds. No hepatosplenomegaly appreciated.  EXTREMITIES: No evidence of any cyanosis, clubbing, or peripheral edema.  +2 pedal and radial pulses bilaterally.  NEUROLOGIC: The patient is alert, awake, and oriented x3 with no  focal motor or sensory deficits appreciated bilaterally.  SKIN: Moist and warm with no rashes appreciated.  LYMPHATIC: No cervical or axillary lymphadenopathy.   Data Reviewed: Basic Metabolic Panel: Recent Labs  Lab 04/09/19 0414  04/10/19 0513 04/11/19 0407 04/12/19 0423 04/13/19 0605 04/14/19 0550  NA 138  --  144 140 140 140  --   K 3.3*   < > 3.6 3.5 3.6 3.8 3.6  CL 106  --  114* 111 112* 113*  --   CO2 23  --  23 21* 21* 22  --   GLUCOSE 144*  --  154* 117* 120* 138*  --   BUN 8  --  6* <5* <5* 7*  --   CREATININE 0.37*  --  0.50* 0.56* 0.49* 0.57*  --   CALCIUM 7.1*  --  7.0* 7.0* 7.6* 7.8*  --   MG 2.0  --   --  2.0 2.0 2.2 2.1  PHOS <1.0*   < > 2.2* 1.4* 2.2* 2.8 3.1   < > = values in this interval not displayed.   Liver Function Tests: Recent Labs  Lab 04/10/19 0513  ALBUMIN 2.0*   CBC: Recent Labs  Lab 04/09/19 0414 04/11/19 0407 04/12/19 0423 04/13/19 0605 04/14/19 0550  WBC 16.0* 15.2* 12.4* 10.6*  --   HGB 7.5* 7.4* 7.7* 7.0* 8.7*  HCT 26.9* 26.7* 28.1* 26.1* 30.4*  MCV 80.1 82.4 83.1 82.3  --   PLT 313 342 370 359  --    Cardiac Enzymes: No results for input(s): CKTOTAL, CKMB, CKMBINDEX, TROPONINI in the last 168 hours.  CBG: No results for input(s): GLUCAP in the last 168 hours.  Recent Results (from  the past 240 hour(s))  CULTURE, BLOOD (ROUTINE X 2) w Reflex to ID Panel     Status: None   Collection Time: 04/05/19  1:33 PM   Specimen: BLOOD RIGHT ARM  Result Value Ref Range Status   Specimen Description BLOOD RIGHT ARM  Final   Special Requests   Final    BOTTLES DRAWN AEROBIC AND ANAEROBIC Blood Culture results may not be optimal due to an excessive volume of blood received in culture bottles   Culture   Final    NO GROWTH 5 DAYS Performed at [facility], [address] [place], [place] [address]    Report Status 04/10/2019 FINAL  Final  CULTURE, BLOOD (ROUTINE X 2) w Reflex to ID Panel     Status: None   Collection  Time: 04/05/19  1:42 PM   Specimen: BLOOD LEFT ARM  Result Value Ref Range Status   Specimen Description BLOOD LEFT ARM  Final   Special Requests   Final    BOTTLES DRAWN AEROBIC AND ANAEROBIC Blood Culture results may not be optimal due to an excessive volume of blood received in culture bottles   Culture   Final    NO GROWTH 5 DAYS Performed at [facility], [address] [place], [place] [address]    Report Status 04/10/2019 FINAL  Final  MRSA PCR Screening     Status: None   Collection Time: 04/07/19  9:08 AM   Specimen: Nasopharyngeal  Result Value Ref Range Status   MRSA by PCR NEGATIVE NEGATIVE Final    Comment:        The GeneXpert MRSA Assay (FDA approved for NASAL specimens only), is one component of a comprehensive MRSA colonization surveillance program. It is not intended to diagnose MRSA infection nor to guide or monitor treatment for MRSA infections. Performed at [facility], [address], [place], [place] [address]      Studies: No results found.  Scheduled Meds: . cloNIDine  0.1 mg Oral TID  . diazepam  5 mg Oral N3I  . folic acid  1 mg Oral Daily  . mouth rinse  15 mL Mouth Rinse BID  . multivitamin with minerals  1 tablet Oral Daily  . niacin  500 mg Oral QHS  . pantoprazole (PROTONIX) IV  40 mg Intravenous Q12H  . potassium phosphate (monobasic)  500 mg Oral TID WC & HS  . QUEtiapine  12.5 mg Oral BID  . sodium chloride flush  3 mL Intravenous Q12H  . thiamine  100 mg Oral Daily  . vitamin C  500 mg Oral BID   Continuous Infusions: . sodium chloride 250 mL (04/13/19 1308)    Assessment/Plan:  1. Status post  respiratory failure with hypoxia from multifocal pneumonia-healthcare associated pneumonia improved.  Ongoing aspiration, clinically improved.  Off BiPAP.  Off  high flow oxygen.  Currently on oxygen via nasal cannula.  Completed course of vancomycin and Unasyn and Rocephin antibiotics. 2. Staph epidermidis  bacteremia resolved.  Echocardiogram negative.  Transesophageal echocardiogram deferred by cardiology.  Patient completed course of IV antibiotics as per infectious disease attending. 3. Aspiration pneumonia.  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  Status post ENT evaluation.  Status post modified barium swallow study on dysphagia diet.  Completed course of antibiotics. 4. Acute blood loss anemia with GI bleed.  Recent endoscopy with peptic ulcer disease and gastritis.  On Protonix.  s/p 1 unit  PRBC IV .Marland KitchenMonitor Hb and hct 5. Hypokalemia and hypophosphatemia resolved after repletion 6. Acute rhabdomyolysis improved with IV fluids 7. History of alcohol use.  Monitor for any withdrawal. 8. Hypertension on Norvasc 9. Generalized weakness PT consult appreciated.  Recommended SNF placement.  10. Social worker follow-up for SNF placement ongoing 11. Patient says he cant take care of himself at home, PT recommends SNF 12. Delusions or hallucinations.  Could be from alcohol withdrawal versus psychosis.  Will get psychiatry consult.   Code Status: Full code    Code Status Orders  (From admission, onward)         Start     Ordered   03/31/19 2341  Full code  Continuous     03/31/19 2340        Code Status History    Date Active Date Inactive Code Status Order ID Comments User Context   12/09/2017 1336 12/12/2017 1845 Full Code [id number]  Dustin Flock, MD Inpatient   Advance Care Planning Activity     Family Communication: Spoke with the patient's friend Minda Ditto.  He endorsed that patient does not have any family members who is willing to take care of him and also he refused to be his healthcare power of attorney as it is overwhelming .  Will follow up with social worker Disposition Plan: No plan at this point in time  Consultants:  Infectious disease  Intensivist  Antibiotics: Vancomycin Rocephin Total time taken and  seen the patient 25 minutes  [place]
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
## 2019-04-15 NOTE — Progress Notes (Signed)
Rm 110 had 40.00 (2 twenties), a debit card, a drivers license and a cell phone and neck chain along with shoes in his belongings bag, he wanted to keep them all with him in the bed-  Patient belongings verified by myself and Ginger Set designer

## 2019-04-16 DIAGNOSIS — E512 Wernicke's encephalopathy: Secondary | ICD-10-CM

## 2019-04-16 MED ORDER — CLONIDINE HCL 0.1 MG PO TABS
0.1000 mg | ORAL_TABLET | Freq: Two times a day (BID) | ORAL | Status: DC
  Administered 2019-04-16 – 2019-04-28 (×20): 0.1 mg via ORAL
  Filled 2019-04-16 (×22): qty 1

## 2019-04-16 MED ORDER — LORAZEPAM 0.5 MG PO TABS: 0.5000 mg | ORAL_TABLET | Freq: Three times a day (TID) | ORAL | Status: DC | PRN

## 2019-04-16 MED ORDER — LORAZEPAM 1 MG PO TABS: 1.0000 mg | ORAL_TABLET | Freq: Four times a day (QID) | ORAL | Status: AC | PRN

## 2019-04-16 MED ORDER — ONDANSETRON 4 MG PO TBDP
4.0000 mg | ORAL_TABLET | Freq: Four times a day (QID) | ORAL | Status: AC | PRN
  Filled 2019-04-16: qty 1

## 2019-04-16 MED ORDER — THIAMINE HCL 100 MG/ML IJ SOLN: 100.0000 mg | Freq: Once | INTRAMUSCULAR | Status: DC

## 2019-04-16 MED ORDER — ADULT MULTIVITAMIN W/MINERALS CH
1.0000 | ORAL_TABLET | Freq: Every day | ORAL | Status: DC
  Administered 2019-04-17 – 2019-04-28 (×12): 1 via ORAL
  Filled 2019-04-16 (×12): qty 1

## 2019-04-16 MED ORDER — LOPERAMIDE HCL 2 MG PO CAPS: 2.0000 mg | ORAL_CAPSULE | ORAL | Status: AC | PRN

## 2019-04-16 MED ORDER — VITAMIN B-1 100 MG PO TABS
100.0000 mg | ORAL_TABLET | Freq: Every day | ORAL | Status: DC
  Administered 2019-04-17 – 2019-04-28 (×12): 100 mg via ORAL
  Filled 2019-04-16 (×12): qty 1

## 2019-04-16 MED ORDER — HYDROXYZINE HCL 25 MG PO TABS
25.0000 mg | ORAL_TABLET | Freq: Four times a day (QID) | ORAL | Status: AC | PRN
  Filled 2019-04-16: qty 1

## 2019-04-16 NOTE — Progress Notes (Signed)
Speech Therapy note: reviewed chart notes; consulted MD then NSG re: pt's status. Pt continues to have delusional thoughts and hallucinations per MD note; confabulation of speech during conversation w/ him today. He exhibits poor safety judgement getting up from the chair w/out assistance and pulling off his catheter. He denies any difficulty swallowing; noted he has not been drinking the thickened liquids coming on trays at meals(many were stacked in room). When asked about this, he said he did not need them. Pt appears to have poor insight into his deficits.  Discussed w/ MD who agreed w/ a f/u MBSS tomorrow. Order placed. Pt would benefit from monitoring and assistance at meals to encourage oral intake. Psychiatry is consulted.      Orinda Kenner, Bright, CCC-SLP

## 2019-04-16 NOTE — Consult Note (Signed)
[facility] Face-to-Face Psychiatry Consult   Reason for Consult:  Confusion Referring Physician:  Dr.Sona Posey Pronto Patient Identification: Vale Mousseau MRN:  [id number] Principal Diagnosis: Wernicke's encephalopathy Diagnosis:  Principal Problem:   Wernicke's encephalopathy Active Problems:   GI bleed   Total Time spent with patient: 20 minutes  Subjective:   Sephiroth Mcluckie is a 63 y.o. male patient admitted with pneumonia after he was found by EMS outside his home.  HPI: Patient is a 63 year old male with a long history of severe alcohol dependence, history of GI bleed and hypercholesterolemia.  He has had several hospitalizations in the past few years at various hospitals in the state for alcohol intoxication and withdrawals. During this hospitalization he was hospitalized after being found outside his own home and brought by EMS.  Patient was admitted on 20 June on an alcohol withdrawal protocol along with treatment for his GI bleed, lactic acidosis and acute kidney injury. A consult was placed to psychiatry to assess for his confusion/hallucinations. Patient has been diagnosed with Wernicke's encephalopathy and his symptoms could be a sequelae of that since he has not had any alcohol since 20 June and it is unlikely that it is DTs associated with his alcohol withdrawal. Patient is oriented to self and to the month.  When asked about his mood he states that he is feeling depressed because his wife has left him and has not treated him well.  States that he needs to talk to her therapist about all that.  It is unclear if this is something from his past or stemming from his confusion. He denies any suicidal thoughts and has been mostly calm and not disruptive.  Past Psychiatric History: Patient with multiple admissions for alcohol intoxication and withdrawal and sequelae resulting from the alcohol abuse.  Risk to Self:  Due to his alcohol dependence yes Risk to Others:  None Prior Inpatient  Therapy:  yes Prior Outpatient Therapy:  Unknown  Past Medical History:  Past Medical History:  Diagnosis Date  . ED (erectile dysfunction)   . Hypercholesteremia   . Hypertension   . Lung cancer [facility])     Past Surgical History:  Procedure Laterality Date  . HERNIA REPAIR  1958  . REPLACEMENT TOTAL KNEE BILATERAL  2009  . TONSILLECTOMY  2008   Family History:  Family History  Problem Relation Age of Onset  . Cancer Mother   . Hypertension Father   . Cancer Paternal Grandmother    Family Psychiatric  History: unknown Social History:  Social History   Substance and Sexual Activity  Alcohol Use No  . Frequency: Never   Comment: Quit 2 days ago     Social History   Substance and Sexual Activity  Drug Use No    Social History   Socioeconomic History  . Marital status: Married    Spouse name: Not on file  . Number of children: Not on file  . Years of education: Not on file  . Highest education level: Not on file  Occupational History  . Not on file  Social Needs  . Financial resource strain: Not on file  . Food insecurity    Worry: Not on file    Inability: Not on file  . Transportation needs    Medical: Not on file    Non-medical: Not on file  Tobacco Use  . Smoking status: Never Smoker  . Smokeless tobacco: Never Used  Substance and Sexual Activity  . Alcohol use: No    Frequency:  Never    Comment: Quit 2 days ago  . Drug use: No  . Sexual activity: Not on file  Lifestyle  . Physical activity    Days per week: Not on file    Minutes per session: Not on file  . Stress: Not on file  Relationships  . Social Herbalist on phone: Not on file    Gets together: Not on file    Attends religious service: Not on file    Active member of club or organization: Not on file    Attends meetings of clubs or organizations: Not on file    Relationship status: Not on file  Other Topics Concern  . Not on file  Social History Narrative  . Not on file    Additional Social History:    Allergies:  No Known Allergies  Labs:  Results for orders placed or performed during the hospital encounter of 03/31/19 (from the past 48 hour(s))  Ammonia     Status: None   Collection Time: 04/15/19  1:23 PM  Result Value Ref Range   Ammonia 16 9 - 35 umol/L    Comment: Performed at [facility], [address] [place], [place] [address]  Glucose, capillary     Status: Abnormal   Collection Time: 04/15/19  9:31 PM  Result Value Ref Range   Glucose-Capillary 152 (H) 70 - 99 mg/dL    Current Facility-Administered Medications  Medication Dose Route Frequency Provider Last Rate Last Dose  . 0.9 %  sodium chloride infusion   Intravenous PRN Sela Hua, MD 10 mL/hr at 04/13/19 1308 250 mL at 04/13/19 1308  . acetaminophen (TYLENOL) tablet 650 mg  650 mg Oral Q6H PRN Sela Hua, MD   650 mg at 04/08/19 1515  . calcium carbonate (TUMS - dosed in mg elemental calcium) chewable tablet 400 mg of elemental calcium  2 tablet Oral Q8H PRN Mayo, Pete Pelt, MD      . cloNIDine (CATAPRES) tablet 0.1 mg  0.1 mg Oral BID Fritzi Mandes, MD      . folic acid (FOLVITE) tablet 1 mg  1 mg Oral Daily Ottie Glazier, MD   1 mg at 04/16/19 0931  . guaiFENesin-dextromethorphan (ROBITUSSIN DM) 100-10 MG/5ML syrup 5 mL  5 mL Oral Q4H PRN Tyler Pita, MD   5 mL at 04/16/19 1330  . hydrOXYzine (ATARAX/VISTARIL) tablet 25 mg  25 mg Oral Q6H PRN Patrecia Pour, NP      . ipratropium-albuterol (DUONEB) 0.5-2.5 (3) MG/3ML nebulizer solution 3 mL  3 mL Nebulization Q4H PRN Ottie Glazier, MD      . loperamide (IMODIUM) capsule 2-4 mg  2-4 mg Oral PRN Patrecia Pour, NP      . LORazepam (ATIVAN) tablet 1 mg  1 mg Oral Q6H PRN Patrecia Pour, NP      . MEDLINE mouth rinse  15 mL Mouth Rinse BID Wilhelmina Mcardle, MD   15 mL at 04/16/19 0932  . [START ON 04/17/2019] multivitamin with minerals tablet 1 tablet  1 tablet Oral Daily Waylan Boga Y, NP      .  niacin tablet 500 mg  500 mg Oral QHS Tyler Pita, MD   500 mg at 04/15/19 2203  . ondansetron (ZOFRAN) injection 4 mg  4 mg Intravenous Q6H PRN Seals, Theo Dills, NP   4 mg at 04/12/19 1138  . ondansetron (ZOFRAN-ODT) disintegrating tablet 4 mg  4 mg  Oral Q6H PRN Patrecia Pour, NP      . pantoprazole (PROTONIX) EC tablet 40 mg  40 mg Oral BID Dallie Piles, RPH   40 mg at 04/16/19 0931  . potassium phosphate (monobasic) (K-PHOS ORIGINAL) tablet 500 mg  500 mg Oral TID WC & HS Charlett Nose, RPH   500 mg at 04/16/19 1326  . QUEtiapine (SEROQUEL) tablet 12.5 mg  12.5 mg Oral BID Saundra Shelling, MD   12.5 mg at 04/16/19 0930  . sodium chloride flush (NS) 0.9 % injection 10-40 mL  10-40 mL Intracatheter PRN Wieting, Marlos, MD      . sodium chloride flush (NS) 0.9 % injection 3 mL  3 mL Intravenous Q12H Seals, Angela H, NP   3 mL at 04/16/19 0931  . [START ON 04/17/2019] thiamine (VITAMIN B-1) tablet 100 mg  100 mg Oral Daily Lord, Jamison Y, NP      . vitamin C (ASCORBIC ACID) tablet 500 mg  500 mg Oral BID Loletha Grayer, MD   500 mg at 04/16/19 1113    Musculoskeletal: Strength & Muscle Tone: Unable to assess Gait & Station: Unable to assess Patient leans: Unable to assess  Psychiatric Specialty Exam: Physical Exam  ROS  Blood pressure (!) 114/95, pulse 98, temperature 97.6 F (36.4 C), temperature source Oral, resp. rate 20, height 5\' 9"  (1.753 m), weight 83.8 kg, SpO2 95 %.Body mass index is 27.28 kg/m.  General Appearance: Disheveled  Eye Contact:  Fair  Speech:  Slow  Volume:  Decreased  Mood:  Dysphoric and Irritable  Affect:  Constricted and Labile  Thought Process:  Irrelevant  Orientation: To self  Thought Content:  Delusions and Rumination  Suicidal Thoughts:  No  Homicidal Thoughts:  No  Memory:  Immediate;   Poor Recent;   Poor Remote;   Poor  Judgement:  Impaired  Insight:  Shallow  Psychomotor Activity:  Decreased  Concentration:  Concentration:  Fair and Attention Span: Fair  Recall:  Poor  Fund of Knowledge:  Poor  Language:  Fair  Akathisia:  No  Handed:  Right  AIMS (if indicated):     Assets:  Communication Skills  ADL's:  Impaired  Cognition:  Impaired,  Mild  Sleep:        Treatment Plan Summary: Daily contact with patient to assess and evaluate symptoms and progress in treatment and Medication management  Patient is a 63 year old male with a long history of alcohol dependence and Wernicke's encephalopathy with multiple hospitalizations throughout the state who presents with GI bleeding and alcohol intoxication requiring extensive withdrawal treatment. Patient previously has refused any substance abuse related treatment or rehabilitation options. He has been presenting with some confusion which could probably be his baseline. Increase Seroquel to 25 mg twice daily.   Disposition: No evidence of imminent risk to self or others at present.   Patient does not meet criteria for psychiatric inpatient admission. Supportive therapy provided about ongoing stressors. Discussed crisis plan, support from social network, calling 911, coming to the Emergency Department, and calling Suicide Hotline.  Elvin So, MD 04/16/2019 3:16 PM

## 2019-04-16 NOTE — Progress Notes (Signed)
[name]    MR#:  [id number]  DATE OF BIRTH:  06-05-1956  SUBJECTIVE:   Patient appears confused. He tells me nurse is not allowing him to leave for his job interview at [employer] today Confused at baseline. He is calm REVIEW OF SYSTEMS:   Review of Systems  Unable to perform ROS: Mental status change   Tolerating Diet:yes Tolerating PT: SNF  DRUG ALLERGIES:  No Known Allergies  VITALS:  Blood pressure (!) 114/95, pulse 98, temperature 97.6 F (36.4 C), temperature source Oral, resp. rate 20, height 5\' 9"  (1.753 m), weight 83.8 kg, SpO2 95 %.  PHYSICAL EXAMINATION:   Physical Exam  GENERAL:  63 y.o.-year-old patient lying in the bed with no acute distress. Appears Chronically ill, disheveled EYES: Pupils equal, round, reactive to light and accommodation. No scleral icterus. Extraocular muscles intact.  HEENT: Head atraumatic, normocephalic. Oropharynx and nasopharynx clear.  NECK:  Supple, no jugular venous distention. No thyroid enlargement, no tenderness.  LUNGS: Normal breath sounds bilaterally, no wheezing, rales, rhonchi. No use of accessory muscles of respiration.  CARDIOVASCULAR: S1, S2 normal. No murmurs, rubs, or gallops.  ABDOMEN: Soft, nontender, nondistended. Bowel sounds present. No organomegaly or mass.  EXTREMITIES: No cyanosis, clubbing or edema b/l.    NEUROLOGIC: Cranial nerves II through XII are intact. No focal Motor or sensory deficits b/l.  Grossly intact, no focal PSYCHIATRIC:  patient is alert but disoriented x3 SKIN: No obvious rash, lesion, or ulcer.   LABORATORY PANEL:  CBC Recent Labs  Lab 04/13/19 0605 04/14/19 0550  WBC 10.6*  --   HGB 7.0* 8.7*  HCT 26.1* 30.4*  PLT 359  --     Chemistries  Recent Labs  Lab 04/13/19 0605 04/14/19 0550  NA 140  --   K 3.8 3.6  CL 113*  --   CO2 22  --   GLUCOSE 138*  --   BUN 7*  --   CREATININE 0.57*  --    CALCIUM 7.8*  --   MG 2.2 2.1   Cardiac Enzymes No results for input(s): TROPONINI in the last 168 hours. RADIOLOGY:  No results found. ASSESSMENT AND PLAN:  1. Status post  respiratory failure with hypoxia from multifocal pneumonia-healthcare associated pneumonia improved.  Ongoing aspiration, clinically improved.  Off BiPAP.  Off  high flow oxygen.  Currently on oxygen via nasal cannula.  Completed course of vancomycin and Unasyn and Rocephin antibiotics. 2. Staph epidermidis bacteremia resolved.  Echocardiogram negative.  Transesophageal echocardiogram deferred by cardiology.  Patient completed course of IV antibiotics as per infectious disease attending. 3. Aspiration pneumonia.  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  Status post ENT evaluation.  Status post modified barium swallow study on dysphagia diet.  Completed course of antibiotics.per speech pt will need repeat MBS 4. Acute blood loss anemia with GI bleed.  Recent endoscopy with peptic ulcer disease and gastritis.  On Protonix.  s/p 1 unit PRBC IV .Marland KitchenMonitor Hb and hct 5. Hypokalemia and hypophosphatemia resolved after repletion 6. Acute rhabdomyolysis improved with IV fluids 7. History of alcohol use.  Monitor for any withdrawal. 8. Hypertension on Norvasc 9. Generalized weakness PT consult appreciated.  Recommended SNF placement.  10. Social worker follow-up for SNF placement ongoing 11. Patient says he cant take care of himself at home, PT recommends SNF 12. Delusions or hallucinations.  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   psychiatry consult done--pending recommendations   Case discussed with Care Management/Social Worker.  CODE STATUS: full  DVT Prophylaxis: SCD/ambulation TOTAL TIME TAKING CARE OF THIS PATIENT: *30* minutes.  >50% time spent on counselling and coordination of care  POSSIBLE D/C IN *?* DAYS,  DEPENDING ON CLINICAL CONDITION.  Note: This dictation was prepared with Dragon dictation along with smaller phrase technology. Any transcriptional errors that result from this process are unintentional.  Fritzi Mandes M.D on 04/16/2019 at 1:34 PM  Between 7am to 6pm - Pager - [phone]  After 6pm go to [email or web address] - password [email or web address] [facility]  Office  [phone]  CC: Primary care physician; Patient, [name] PerPatient ID: George Wade, male   DOB: [birth year], 63 y.o.   MRN: [id number]

## 2019-04-16 NOTE — Progress Notes (Signed)
Physical Therapy Treatment Patient Details Name: George Wade MRN: [id number] DOB: 1955/10/22 Today's Date: 04/16/2019    History of Present Illness presented to ER secondary to progressive weakness, black/tarry stools and fall/unable to recover (outside in rain x4-5 hours); admitted for management of GIB, aspiration pna, encephalopathy and staph bacteremia. Stay complicated by CCU admission secondary to aspiration pneumonia. and alcohol withdrawl. Pt currently requesting cough medicine, RN notified.    PT Comments    Pt is making gradual progress towards goals, still very confused this date. He reports he lost his piano in the room and can't find it. He also talk about recent divorce. Pt becomes tearful while discussing details. Difficulty to keep pt on track to perform therapy. Strength appears improved this date with increased independence noted, however pt has limited recall of there-ex and needs cues to perform mobility tasks. Pleasant and agreeable to therapy. Will continue to progress as able.   Follow Up Recommendations  SNF     Equipment Recommendations  Rolling walker with 5" wheels    Recommendations for Other Services       Precautions / Restrictions Precautions Precautions: Fall Restrictions Weight Bearing Restrictions: No    Mobility  Bed Mobility Overal bed mobility: Needs Assistance Bed Mobility: Supine to Sit     Supine to sit: Min assist     General bed mobility comments: lacks awareness of how to initiate movement towards EOB. With cues, can follow commands and needs assist for B LE. Becomes SOB while seated at EOB. O2 sats at 87%. Donned 2L of O2 with sats improving to WNL  Transfers Overall transfer level: Needs assistance Equipment used: Rolling walker (2 wheeled) Transfers: Sit to/from Stand Sit to Stand: Min assist         General transfer comment: elevated bed. RW used. Cues for sequencing. Once standing, flexed posture noted. First attempt,  only able to tolerate standing approx 10 seconds prior to fatigue. 2 attempts performed.  Ambulation/Gait Ambulation/Gait assistance: Min assist Gait Distance (Feet): 5 Feet Assistive device: Rolling walker (2 wheeled) Gait Pattern/deviations: Step-to pattern     General Gait Details: initially takes good steps, then reverts back to shuffle gait. Fatigues quickly, 2L of O2 reapplied at end of mobility.   Stairs             Wheelchair Mobility    Modified Rankin (Stroke Patients Only)       Balance Overall balance assessment: Needs assistance Sitting-balance support: Feet supported;Bilateral upper extremity supported Sitting balance-Leahy Scale: Good     Standing balance support: Bilateral upper extremity supported Standing balance-Leahy Scale: Fair                              Cognition Arousal/Alertness: Awake/alert Behavior During Therapy: WFL for tasks assessed/performed Overall Cognitive Status: Impaired/Different from baseline                                        Exercises Other Exercises Other Exercises: supine ther-ex performed including B LE SLRs, AP, hip abd/add and quad sets. 15 reps. Also performed standing marching x 5 reps prior to fatigue and needs to sit. Cga given.    General Comments        Pertinent Vitals/Pain Pain Assessment: No/denies pain    Home Living  Prior Function            PT Goals (current goals can now be found in the care plan section) Acute Rehab PT Goals Patient Stated Goal: to walk again PT Goal Formulation: With patient Time For Goal Achievement: 04/25/19 Potential to Achieve Goals: Good Progress towards PT goals: Progressing toward goals    Frequency    Min 2X/week      PT Plan Current plan remains appropriate    Co-evaluation              AM-PAC PT "6 Clicks" Mobility   Outcome Measure  Help needed turning from your back to your  side while in a flat bed without using bedrails?: A Little Help needed moving from lying on your back to sitting on the side of a flat bed without using bedrails?: A Little Help needed moving to and from a bed to a chair (including a wheelchair)?: A Little Help needed standing up from a chair using your arms (e.g., wheelchair or bedside chair)?: A Little Help needed to walk in hospital room?: A Little Help needed climbing 3-5 steps with a railing? : A Lot 6 Click Score: 17    End of Session Equipment Utilized During Treatment: Gait belt Activity Tolerance: Patient tolerated treatment well Patient left: in chair;with chair alarm set Nurse Communication: Mobility status PT Visit Diagnosis: Muscle weakness (generalized) (M62.81);Difficulty in walking, not elsewhere classified (R26.2);History of falling (Z91.81);Unsteadiness on feet (R26.81)     Time: 6861-6837 PT Time Calculation (min) (ACUTE ONLY): 25 min  Charges:  $Gait Training: 8-22 mins $Therapeutic Exercise: 8-22 mins                     Greggory Stallion, PT, DPT [phone]    Yobani Schertzer 04/16/2019, 1:22 PM

## 2019-04-17 ENCOUNTER — Inpatient Hospital Stay: Payer: Self-pay

## 2019-04-17 NOTE — Progress Notes (Signed)
[place] at [place] NAME: George Wade    MR#:  [id number]  DATE OF BIRTH:  1955/11/20  SUBJECTIVE:   Where am I going today? Confused at baseline. He is calm REVIEW OF SYSTEMS:   Review of Systems  Unable to perform ROS: Mental status change   Tolerating Diet:yes Tolerating PT: SNF  DRUG ALLERGIES:  No Known Allergies  VITALS:  Blood pressure (!) 110/92, pulse 92, temperature 98.4 F (36.9 C), temperature source Oral, resp. rate 20, height 5\' 9"  (1.753 m), weight 83.8 kg, SpO2 96 %.  PHYSICAL EXAMINATION:   Physical Exam  GENERAL:  63 y.o.-year-old patient lying in the bed with no acute distress. Appears Chronically ill, disheveled EYES: Pupils equal, round, reactive to light and accommodation. No scleral icterus. Extraocular muscles intact.  HEENT: Head atraumatic, normocephalic. Oropharynx and nasopharynx clear.  NECK:  Supple, no jugular venous distention. No thyroid enlargement, no tenderness.  LUNGS: Normal breath sounds bilaterally, no wheezing, rales, rhonchi. No use of accessory muscles of respiration.  CARDIOVASCULAR: S1, S2 normal. No murmurs, rubs, or gallops.  ABDOMEN: Soft, nontender, nondistended. Bowel sounds present. No organomegaly or mass.  EXTREMITIES: No cyanosis, clubbing or edema b/l.    NEUROLOGIC: Cranial nerves II through XII are intact. No focal Motor or sensory deficits b/l.  Grossly intact, no focal PSYCHIATRIC:  patient is alert but disoriented x3 SKIN: No obvious rash, lesion, or ulcer.   LABORATORY PANEL:  CBC Recent Labs  Lab 04/13/19 0605 04/14/19 0550  WBC 10.6*  --   HGB 7.0* 8.7*  HCT 26.1* 30.4*  PLT 359  --     Chemistries  Recent Labs  Lab 04/13/19 0605 04/14/19 0550  NA 140  --   K 3.8 3.6  CL 113*  --   CO2 22  --   GLUCOSE 138*  --   BUN 7*  --   CREATININE 0.57*  --   CALCIUM 7.8*  --   MG 2.2 2.1   Cardiac Enzymes No results for input(s): TROPONINI in  the last 168 hours. RADIOLOGY:  No results found. ASSESSMENT AND PLAN:  1. Status post  respiratory failure with hypoxia from multifocal pneumonia-healthcare associated pneumonia improved.  Ongoing aspiration, clinically improved.  Off BiPAP.  Off  high flow oxygen.  Currently on oxygen via nasal cannula.  Completed course of vancomycin and Unasyn and Rocephin antibiotics. 2. Staph epidermidis bacteremia resolved.  Echocardiogram negative.  Transesophageal echocardiogram deferred by cardiology.  Patient completed course of IV antibiotics as per infectious disease attending. 3. Aspiration pneumonia.  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  Status post ENT evaluation.  Status post modified barium swallow study on dysphagia diet.  Completed course of antibiotics.per speech pt will need repeat MBS 4. Acute blood loss anemia with GI bleed.  Recent endoscopy with peptic ulcer disease and gastritis.  On Protonix.  s/p 1 unit PRBC IV .Marland KitchenMonitor Hb and hct 5. Hypokalemia and hypophosphatemia resolved after repletion 6. Acute rhabdomyolysis improved with IV fluids 7. History of alcohol use.  Monitor for any withdrawal. 8. Hypertension on Norvasc 9. Generalized weakness PT consult appreciated.  Recommended SNF placement.  10. Social worker follow-up for SNF placement ongoing 11. Delusions or hallucinations.  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  Pt apparently has no immediate family to help make decision. CM spoke  with steve(friend). No family to care for him. APS contacted for guardian ship    Case discussed with Care Management/Social Worker.  CODE STATUS: full  DVT Prophylaxis: SCD/ambulation TOTAL TIME TAKING CARE OF THIS PATIENT: *22 minutes.  >50% time spent on counselling and coordination of care  POSSIBLE D/C IN *?* DAYS,  DEPENDING ON CLINICAL CONDITION.  Note: This dictation was prepared with Dragon dictation along with smaller phrase technology. Any transcriptional errors that result from this process are unintentional.  Fritzi Mandes M.D on 04/17/2019 at 12:14 PM  Between 7am to 6pm - Pager - [phone]  After 6pm go to [email or web address] - password [email or web address] [facility]  Office  [phone]  CC: Primary care physician; Patient, [name] PerPatient ID: George Wade, male   DOB: [birth year], 63 y.o.   MRN: [id number]

## 2019-04-17 NOTE — Progress Notes (Signed)
   04/17/19 1000  Clinical Encounter Type  Visited With Patient  Visit Type Follow-up  Ch was rounding. Pt greeted the ch pleasantly. Pt said he was doing physically good but was experiencing some tension with the hospital care team. Pt said he would focus on the light at the end of the tunnel and expressed his hope to go home soon. Pt complained of pain on his right lower chest and asked for advil and cough drops. Ch let the nurse know. Ch did not notice any confusion in the pt yet on this visit. Ch will follow up later to continue providing emotional support.

## 2019-04-17 NOTE — Progress Notes (Signed)
Nutrition Follow-up  DOCUMENTATION CODES:   Not applicable  INTERVENTION:  Provide Magic cup TID with meals, each supplement provides 290 kcal and 9 grams of protein.  NUTRITION DIAGNOSIS:   Inadequate oral intake related to acute illness(GIB) as evidenced by NPO status.  Resolving.  GOAL:   Patient will meet greater than or equal to 90% of their needs  Progressing.  MONITOR:   PO intake, Supplement acceptance, Labs, Weight trends, Skin, I & O's  REASON FOR ASSESSMENT:   Malnutrition Screening Tool    ASSESSMENT:   63 y.o. 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 Pt found to have aspiration PNA and GIB  Patient reports his appetite is improved but not quite back to normal. He reports he is finishing "most" of his meals. Per chart he is eating 50-100% of meals. His diet was downgraded to dysphagia 1 with honey-thick on 7/2. He is receiving Magic Cup occasionally on trays and enjoys these. Will order to come on every tray to help meet calorie/protein needs.  Medications reviewed and include: folic acid 1 mg daily, MVI daily, niacin 500 mg QHS, pantoprazole, potassium phosphate 500 mg TID and QHS, Seroquel 12.5 mg BID, thiamine 100 mg daily, vitamin C 500 mg BID.  Labs reviewed.  Diet Order:   Diet Order            DIET - DYS 1 Room service appropriate? Yes; Fluid consistency: Honey Thick  Diet effective now             EDUCATION NEEDS:   No education needs have been identified at this time  Skin:  Skin Assessment: Skin Integrity Issues:(MSAD to groin)  Last BM:  04/16/2019 per chart  Height:   Ht Readings from Last 1 Encounters:  03/31/19 5\' 9"  (1.753 m)    Weight:   Wt Readings from Last 1 Encounters:  04/08/19 83.8 kg   Ideal Body Weight:  72.7 kg  BMI:  Body mass index is 27.28 kg/m.  Estimated Nutritional Needs:   Kcal:  1900-2200kcal/day  Protein:  95-110g/day  Fluid:  >2.1L/day  Willey Blade, MS, RD, LDN Office: [phone] Pager: [phone] After Hours/Weekend Pager: [phone]

## 2019-04-17 NOTE — Evaluation (Signed)
Objective Swallowing Evaluation: Type of Study: MBS-Modified Barium Swallow Study (repeat mbss)   Patient Details  Name: George Wade MRN: [id number] Date of Birth: October 14, 1955  Today's Date: 04/17/2019 Time: SLP Start Time (ACUTE ONLY): 1200 -SLP Stop Time (ACUTE ONLY): 1300  SLP Time Calculation (min) (ACUTE ONLY): 60 min   Past Medical History:  Past Medical History:  Diagnosis Date  . ED (erectile dysfunction)   . Hypercholesteremia   . Hypertension   . Lung cancer [facility])    Past Surgical History:  Past Surgical History:  Procedure Laterality Date  . HERNIA REPAIR  1958  . REPLACEMENT TOTAL KNEE BILATERAL  2009  . TONSILLECTOMY  2008   HPI: Pt is a 63 y.o. male who has a history of daily alcohol drinking and alcohol withdrawal, seizure in past who states he has been having diarrhea for the last month and dark vomit since yesterday. 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  This admission, he was brought to the emergency room by EMS services after being found outside his home.  Patient reports having laid in the rain for approximately 4 hours prior to EMS services being called by his neighbor.  He reports becoming progressively weak over the last month with diarrhea as well as nausea and vomiting over the last 2 days.  Pt seemed more congested since his arrival. Coughing and then spits out rust colored sputum.  On review of patient's prior records, he has a history of GI bleed with EGD performed at [facility] on 09/29/2018 finding severe ulcerating esophagitis of the entire mid and distal esophagus. There was no evidence of varices or active esophageal bleeding. GI has assessed this pt during admission.  Pt has required admission to CCU; he remains w/ significant  Cognitive/Mental Status decline w/ poor insight and judgement into safety issues/situations. Much Confabulation is noted in his conversation - suspect much of this is directly related to the extensive ETOH use, withdrawal issues. Psychiatry is following currently.    Subjective: Pt was alert, pleasant and cooperative. Mod+ Cognitive decline in his engagement and decisions/responses during the study(confabulation)    Assessment / Plan / Recommendation  CHL IP CLINICAL IMPRESSIONS 04/17/2019  Clinical Impression At this Repeat MBSS today, pt appears to present w/ Moderate-Severe oropharyngeal phase dysphagia w/ increased risk for aspiration thus Pulmonary decline from negative sequelae of aspiration. Of concern, pt exhibited aspiration during this study w/ an initially Silent response; much delayed cough response to the aspirated material. During the oral phase, noted increased oral phase time for bolus manipulation and piecemealing(into 3 smaller boluses) w/ each trial consistency, including all liquid consistency trials; min oral residue which pt cleared when given verbal cues to use lingual sweeping and f/u, DRY swallow. During the pharyngeal phase, pt exhibited a Moderately delayed pharyngeal swallow initiation w/ bolus consistencies spilling to, and filling, the pyriform sinuses - especially liquid consistencies of thin, Nectar, Honey. Pureed and minced, moistened trial consistencies appeared to trigger the pharyngeal swalllow at the level of the Valleculae. Min-Mod. pharyngeal residue remained along BOT and min+ amount throughout the pharynx in the Valleculae, aeryepiglottic folds, and Pyriform Sinuses indicating decreased pharyngeal pressure and decreased laryngeal excursion during the swallow. Min decreased anterior movement during laryngeal excursion appreciated. During the Esophageal phase, grossly appeared [facility] for bolus movement into/through the cervical  Esophagus viewable - shoulders obscured much  of the view. Min potential osteophyte noted at [facility] but did not appear to impede motility of bolus material through the cervical Esophagus.   As pt presents w/ Mod-Severe decline in Cognitive/Mental Status, he demonstrates poor insight and awareness into his deficits as well as using the recommended precautions and strategies to be utilized during oral intake for safer swallowing. Pt requires FULL Supervision w/ all oral intake. MD updated.   SLP Visit Diagnosis Dysphagia, oropharyngeal phase (R13.12)  Attention and concentration deficit following --  Frontal lobe and executive function deficit following --  Impact on safety and function Severe aspiration risk;Risk for inadequate nutrition/hydration      CHL IP TREATMENT RECOMMENDATION 04/17/2019  Treatment Recommendations Defer treatment plan to f/u with SLP     Prognosis 04/17/2019  Prognosis for Safe Diet Advancement Fair  Barriers to Reach Goals Cognitive deficits;Time post onset;Severity of deficits;Behavior  Barriers/Prognosis Comment --    CHL IP DIET RECOMMENDATION 04/17/2019  SLP Diet Recommendations Dysphagia 1 (Puree) solids;Honey thick liquids  Liquid Administration via Cup;No straw  Medication Administration Crushed with puree  Compensations Minimize environmental distractions;Small sips/bites;Slow rate;Lingual sweep for clearance of pocketing;Multiple dry swallows after each bite/sip;Follow solids with liquid  Postural Changes Remain semi-upright after after feeds/meals (Comment);Seated upright at 90 degrees      CHL IP OTHER RECOMMENDATIONS 04/17/2019  Recommended Consults --  Oral Care Recommendations Oral care BID;Staff/trained caregiver to provide oral care  Other Recommendations Order thickener from pharmacy;Prohibited food (jello, ice cream, thin soups);Remove water pitcher;Have oral suction available      CHL IP FOLLOW UP RECOMMENDATIONS 04/17/2019  Follow up Recommendations Skilled Nursing facility      [facility] IP FREQUENCY  AND DURATION 04/12/2019  Speech Therapy Frequency (ACUTE ONLY) min 1 x/week  Treatment Duration 1 week           CHL IP ORAL PHASE 04/17/2019  Oral Phase Impaired  Oral - Pudding Teaspoon --  Oral - Pudding Cup --  Oral - Honey Teaspoon --  Oral - Honey Cup --  Oral - Nectar Teaspoon --  Oral - Nectar Cup --  Oral - Nectar Straw --  Oral - Thin Teaspoon --  Oral - Thin Cup --  Oral - Thin Straw --  Oral - Puree --  Oral - Mech Soft --  Oral - Regular --  Oral - Multi-Consistency --  Oral - Pill --  Oral Phase - Comment noted increased oral phase time for bolus manipulation and piecemealing(into 3 smaller boluses) w/ each trial consistency, including all liquid consistency trials; min oral residue which pt cleared when given verbal cues to use lingual sweeping and f/u, DRY swallow    CHL IP PHARYNGEAL PHASE 04/17/2019  Pharyngeal Phase Impaired  Pharyngeal- Pudding Teaspoon --  Pharyngeal --  Pharyngeal- Pudding Cup --  Pharyngeal --  Pharyngeal- Honey Teaspoon --  Pharyngeal --  Pharyngeal- Honey Cup --  Pharyngeal --  Pharyngeal- Nectar Teaspoon --  Pharyngeal --  Pharyngeal- Nectar Cup --  Pharyngeal --  Pharyngeal- Nectar Straw --  Pharyngeal --  Pharyngeal- Thin Teaspoon --  Pharyngeal --  Pharyngeal- Thin Cup --  Pharyngeal --  Pharyngeal- Thin Straw --  Pharyngeal --  Pharyngeal- Puree --  Pharyngeal --  Pharyngeal- Mechanical Soft --  Pharyngeal --  Pharyngeal- Regular --  Pharyngeal --  Pharyngeal- Multi-consistency --  Pharyngeal --  Pharyngeal- Pill --  Pharyngeal --  Pharyngeal Comment pt exhibited a Moderately delayed  pharyngeal swallow initiation w/ bolus consistencies spilling to, and filling, the pyriform sinuses - especially liquid consistencies of thin, Nectar, Honey. Pureed and minced, moistened trial consistencies appeared to trigger the pharyngeal swalllow at the level of the Valleculae. Min-Mod. pharyngeal residue remained along BOT and min+  amount throughout the pharynx in the Valleculae, aeryepiglottic folds, and Pyriform Sinuses indicating decreased pharyngeal pressure and decreased laryngeal excursion during the swallow. Min decreased anterior movement during laryngeal excursion appreciated.      CHL IP CERVICAL ESOPHAGEAL PHASE 04/17/2019  Cervical Esophageal Phase WFL  Pudding Teaspoon --  Pudding Cup --  Honey Teaspoon --  Honey Cup --  Nectar Teaspoon --  Nectar Cup --  Nectar Straw --  Thin Teaspoon --  Thin Cup --  Thin Straw --  Puree --  Mechanical Soft --  Regular --  Multi-consistency --  Pill --  Cervical Esophageal Comment grossly appeared [facility] for bolus movement into/through the cervical Esophagus viewable - shoulders obscured much of the view.         Orinda Kenner, [place], CCC-SLP Jayel Inks 04/17/2019, 2:48 PM

## 2019-04-17 NOTE — Progress Notes (Signed)
PT Cancellation Note  Patient Details Name: George Wade MRN: [id number] DOB: [birth year]   Cancelled Treatment:    Reason Eval/Treat Not Completed: Fatigue/lethargy limiting ability to participate;Patient declined, no reason specified(Chart reviewed, attempted treatmentt session. Pt reports he is interested but drowsy and would prefer to work with PT later in day. Will reattempt at later date/time as able.)  10:59 AM, 04/17/19 Etta Grandchild, PT, DPT Physical Therapist - [facility]  [phone] ([place])    Buccola,Allan C 04/17/2019, 10:59 AM

## 2019-04-17 NOTE — TOC Progression Note (Signed)
Transition of Care [facility]) - Progression Note    Patient Details  Name: Isam Unrein MRN: [id number] Date of Birth: 21-Oct-1955  Transition of Care [facility]) CM/SW Contact  Shelbie Hutching, RN Phone Number: 04/17/2019, 2:59 PM  Clinical Narrative:    APS report has been made and request for guardianship.     Expected Discharge Plan: [place] Barriers to Discharge: Continued Medical Work up  Expected Discharge Plan and Services Expected Discharge Plan: [place] In-house Referral: Clinical Social Work Discharge Planning Services: CM Consult   Living arrangements for the past 2 months: Single Family Home                                       Social Determinants of Health (SDOH) Interventions    Readmission Risk Interventions No flowsheet data found.

## 2019-04-17 NOTE — TOC Progression Note (Signed)
Transition of Care [facility]) - Progression Note    Patient Details  Name: George Wade MRN: [id number] Date of Birth: Dec 23, 1955  Transition [facility] Care [facility]) CM/SW Contact  Shelbie Hutching, RN Phone Number: 04/17/2019, 10:31 AM  Clinical Narrative:    Patient is confused and unable to make decisions regarding discharge planning.  RNCM spoke with patient's friend [name], listed as his emergency contact.  [name] confirms that patient cannot return to his residence and the patient has no family that is willing to help or have anything to do with him.  [name] reports that he is taking the patient's belongings to [facility] off of [address] in [place] reports that the patient already has a storage unit there.  [name] also reports that the patient has a bank account at [facility] where his social security is direct deposited monthly $1800.  APS contacted about guardianship.   Expected Discharge Plan: [place] Barriers to Discharge: Continued Medical Work up  Expected Discharge Plan and Services Expected Discharge Plan: [place] In-house Referral: Clinical Social Work Discharge Planning Services: CM Consult   Living arrangements for the past 2 months: Single Family Home                                       Social Determinants of Health (SDOH) Interventions    Readmission Risk Interventions No flowsheet data found.

## 2019-04-18 NOTE — Progress Notes (Signed)
Speech Language Pathology Treatment: Dysphagia  Patient Details Name: George Wade MRN: [id number] DOB: Jul 21, 1956 Today's Date: 04/18/2019 Time: 7001-7494 SLP Time Calculation (min) (ACUTE ONLY): 35 min  Assessment / Plan / Recommendation Clinical Impression  Pt seen today for ongoing assessment of swallowing. Pt was resting but awakened easily to [employer] greeting. He verbally conversed w/ SLP; confabulation noted in his engagement/responses. Reviewed and discussed the results of the MBSS yesterday; risk for dysphagia w/ aspiration and Pulmonary decline; discussed food and drink options - the Honey consistency liquids recommended at this time. SLP gave education on aspiration precautions and need to use strategies to include f/u, DRY swallows to clear any oropharyngeal residue secondary to bolus piecemealing during the oral phase and decreased strength of swallow.  Pt consumed trials of the Honey consistency liquids w/ no overt s/s of aspiration noted; clear vocal quality noted b/t trials. Pt practiced single swallows w/ f/u, DRY swallows to clear any potential oropharyngeal residue. Noted pt continues to appear to piecemeal boluses, even liquids. Suspect related to Cognitive decline as the bolus piecemealing lengthens the oral phase time.  ST services will f/u w/ trials of minced food consistency at bedside in order to upgrade diet consistency if able and lessen the restriction of the diet consistency somewhat for pt. If pt's Cognitive/Mental Status improves in the next 1-2 weeks, then a repeat MBSS would be recommended to reassess potential upgrade to Nectar consistency liquids in his diet. Pt agreed.    HPI HPI: Pt is a 63 y.o. male who has a history of daily alcohol drinking and alcohol withdrawal, seizure in past who states he has been having diarrhea for the last month and dark vomit since yesterday. 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  This admission, he was brought to the emergency room by EMS services after being found outside his home.  Patient reports having laid in the rain for approximately 4 hours prior to EMS services being called by his neighbor.  He reports becoming progressively weak over the last month with diarrhea as well as nausea and vomiting over the last 2 days.  Pt seemed more congested since his arrival. Coughing and then spits out rust colored sputum.  On review of patient's prior records, he has a history of GI bleed with EGD performed at [facility] on 09/29/2018 finding severe ulcerating esophagitis of the entire mid and distal esophagus. There was no evidence of varices or active esophageal bleeding. GI has assessed this pt during admission.  Pt has required admission to CCU; he remains w/ significant Cognitive/Mental Status decline w/ poor insight and judgement into safety issues/situations. Much Confabulation is noted in his conversation - suspect much of this is directly related to the extensive ETOH use, withdrawal issues. Psychiatry is following currently.       SLP Plan  Continue with current plan of care       Recommendations  Diet recommendations: Dysphagia 1 (puree);Honey-thick liquid Liquids provided via: Cup;No straw Medication Administration: Whole meds with puree(or w/ Honey liquid) Supervision: Patient able to self feed;Intermittent supervision to cue for compensatory strategies Compensations: Minimize environmental distractions;Small sips/bites;Slow rate;Lingual sweep for clearance of pocketing;Multiple dry swallows after each bite/sip;Follow solids with liquid;Effortful swallow Postural Changes and/or Swallow Maneuvers: Seated upright 90 degrees;Upright 30-60 min after meal  General recommendations: (Dietician  f/u) Oral Care Recommendations: Oral care BID;Staff/trained caregiver to provide oral care Follow up Recommendations: Skilled Nursing facility(TBD) SLP Visit Diagnosis: Dysphagia, oropharyngeal phase (R13.12) Plan: Continue with current plan of care       [place], [place], CCC-SLP George Wade 04/18/2019, 2:10 PM

## 2019-04-18 NOTE — Progress Notes (Signed)
To whom it may concern: George Wade DOB 14-Feb-1956 will require a short term nursing home stay- anticipated 30 days or less for rehabilitation and strengthening.  The plan is for return home.

## 2019-04-18 NOTE — NC FL2 (Signed)
[place] LEVEL OF CARE SCREENING TOOL     IDENTIFICATION  Patient Name: George Wade Birthdate: 30-Sep-1956 Sex: male Admission Date (Current Location): 03/31/2019  [place] and [place] Number:  Engineering geologist and Address:  [facility], [address] [place], [place] [address]      Provider Number: [id number]  Attending Physician Name and Address:  Fritzi Mandes, MD  Relative Name and Phone Number:  none    Current Level of Care: Hospital Recommended Level of Care: [place] Prior Approval Number:    Date Approved/Denied:   PASRR Number: pending  Discharge Plan: SNF    Current Diagnoses: Patient Active Problem List   Diagnosis Date Noted  . Wernicke's encephalopathy 04/15/2019  . GI bleed 03/31/2019  . Alcohol withdrawal ([place]) 12/09/2017    Orientation RESPIRATION BLADDER Height & Weight     Self, Place  Normal Incontinent Weight: 83.8 kg Height:  5\' 9"  (175.3 cm)  BEHAVIORAL SYMPTOMS/MOOD NEUROLOGICAL BOWEL NUTRITION STATUS      Incontinent Diet(dysphagia diet- thickened liquids)  AMBULATORY STATUS COMMUNICATION OF NEEDS Skin   Extensive Assist Verbally Normal                       Personal Care Assistance Level of Assistance  Bathing, Feeding, Dressing Bathing Assistance: Limited assistance Feeding assistance: Independent Dressing Assistance: Maximum assistance     Functional Limitations Info             SPECIAL CARE FACTORS FREQUENCY  PT (By licensed PT), OT (By licensed OT)     PT Frequency: 5 times per week OT Frequency: 5 times per week            Contractures Contractures Info: Not present    Additional Factors Info  Code Status, Allergies Code Status Info: full Allergies Info: none           Current Medications (04/18/2019):  This is the current hospital active medication list Current Facility-Administered Medications  Medication Dose Route Frequency Provider  Last Rate Last Dose  . 0.9 %  sodium chloride infusion   Intravenous PRN Sela Hua, MD 10 mL/hr at 04/13/19 1308 250 mL at 04/13/19 1308  . acetaminophen (TYLENOL) tablet 650 mg  650 mg Oral Q6H PRN Sela Hua, MD   650 mg at 04/17/19 1833  . calcium carbonate (TUMS - dosed in mg elemental calcium) chewable tablet 400 mg of elemental calcium  2 tablet Oral Q8H PRN Mayo, Pete Pelt, MD      . cloNIDine (CATAPRES) tablet 0.1 mg  0.1 mg Oral BID Fritzi Mandes, MD   0.1 mg at 04/18/19 0933  . folic acid (FOLVITE) tablet 1 mg  1 mg Oral Daily Ottie Glazier, MD   1 mg at 04/18/19 0933  . guaiFENesin-dextromethorphan (ROBITUSSIN DM) 100-10 MG/5ML syrup 5 mL  5 mL Oral Q4H PRN Tyler Pita, MD   5 mL at 04/17/19 1836  . hydrOXYzine (ATARAX/VISTARIL) tablet 25 mg  25 mg Oral Q6H PRN Patrecia Pour, NP      . ipratropium-albuterol (DUONEB) 0.5-2.5 (3) MG/3ML nebulizer solution 3 mL  3 mL Nebulization Q4H PRN Ottie Glazier, MD      . loperamide (IMODIUM) capsule 2-4 mg  2-4 mg Oral PRN Patrecia Pour, NP      . LORazepam (ATIVAN) tablet 1 mg  1 mg Oral Q6H PRN Patrecia Pour, NP      . MEDLINE  mouth rinse  15 mL Mouth Rinse BID Wilhelmina Mcardle, MD   15 mL at 04/18/19 0933  . multivitamin with minerals tablet 1 tablet  1 tablet Oral Daily Patrecia Pour, NP   1 tablet at 04/18/19 [phone]  . niacin tablet 500 mg  500 mg Oral QHS Tyler Pita, MD   500 mg at 04/17/19 2103  . ondansetron (ZOFRAN) injection 4 mg  4 mg Intravenous Q6H PRN Seals, Theo Dills, NP   4 mg at 04/12/19 1138  . ondansetron (ZOFRAN-ODT) disintegrating tablet 4 mg  4 mg Oral Q6H PRN Patrecia Pour, NP      . pantoprazole (PROTONIX) EC tablet 40 mg  40 mg Oral BID Dallie Piles, RPH   40 mg at 04/18/19 6503  . potassium phosphate (monobasic) (K-PHOS ORIGINAL) tablet 500 mg  500 mg Oral TID WC & HS Charlett Nose, RPH   500 mg at 04/18/19 5465  . QUEtiapine (SEROQUEL) tablet 12.5 mg  12.5 mg Oral BID Saundra Shelling, MD   12.5 mg at 04/18/19 0933  . sodium chloride flush (NS) 0.9 % injection 10-40 mL  10-40 mL Intracatheter PRN Wieting, Christina, MD      . sodium chloride flush (NS) 0.9 % injection 3 mL  3 mL Intravenous Q12H Seals, Angela H, NP   3 mL at 04/18/19 0934  . thiamine (VITAMIN B-1) tablet 100 mg  100 mg Oral Daily Patrecia Pour, NP   100 mg at 04/18/19 6812  . vitamin C (ASCORBIC ACID) tablet 500 mg  500 mg Oral BID Loletha Grayer, MD   500 mg at 04/18/19 7517     Discharge Medications: Please see discharge summary for a list of discharge medications.  Relevant Imaging Results:  Relevant Lab Results:   Additional Information SS# [id number]  Shelbie Hutching, RN

## 2019-04-18 NOTE — Progress Notes (Signed)
[place] at [place] NAME: George Wade    MR#:  [id number]  DATE OF BIRTH:  Mar 18, 1956  SUBJECTIVE:   Confused at baseline. He is calm REVIEW OF SYSTEMS:   Review of Systems  Unable to perform ROS: Mental status change   Tolerating Diet:yes Tolerating PT: SNF  DRUG ALLERGIES:  No Known Allergies  VITALS:  Blood pressure 112/88, pulse 94, temperature 98.7 F (37.1 C), temperature source Oral, resp. rate 18, height 5\' 9"  (1.753 m), weight 83.8 kg, SpO2 96 %.  PHYSICAL EXAMINATION:   Physical Exam  GENERAL:  63 y.o.-year-old patient lying in the bed with no acute distress. Appears Chronically ill, disheveled EYES: Pupils equal, round, reactive to light and accommodation. No scleral icterus. Extraocular muscles intact.  HEENT: Head atraumatic, normocephalic. Oropharynx and nasopharynx clear.  NECK:  Supple, no jugular venous distention. No thyroid enlargement, no tenderness.  LUNGS: Normal breath sounds bilaterally, no wheezing, rales, rhonchi. No use of accessory muscles of respiration.  CARDIOVASCULAR: S1, S2 normal. No murmurs, rubs, or gallops.  ABDOMEN: Soft, nontender, nondistended. Bowel sounds present. No organomegaly or mass.  EXTREMITIES: No cyanosis, clubbing or edema b/l.    NEUROLOGIC: Cranial nerves II through XII are intact. No focal Motor or sensory deficits b/l.  Grossly intact, no focal PSYCHIATRIC:  patient is alert but disoriented SKIN: No obvious rash, lesion, or ulcer.   LABORATORY PANEL:  CBC Recent Labs  Lab 04/13/19 0605 04/14/19 0550  WBC 10.6*  --   HGB 7.0* 8.7*  HCT 26.1* 30.4*  PLT 359  --     Chemistries  Recent Labs  Lab 04/13/19 0605 04/14/19 0550  NA 140  --   K 3.8 3.6  CL 113*  --   CO2 22  --   GLUCOSE 138*  --   BUN 7*  --   CREATININE 0.57*  --   CALCIUM 7.8*  --   MG 2.2 2.1   Cardiac Enzymes No results for input(s): TROPONINI in the last 168 hours. RADIOLOGY:   No results found. ASSESSMENT AND PLAN:  1. Status post  respiratory failure with hypoxia from multifocal pneumonia-healthcare associated pneumonia improved.  Ongoing aspiration, clinically improved.  Off BiPAP.  Off  high flow oxygen.  Currently on oxygen via nasal cannula.  Completed course of vancomycin and Unasyn and Rocephin antibiotics. 2. Staph epidermidis bacteremia resolved.  Echocardiogram negative.  Transesophageal echocardiogram deferred by cardiology.  Patient completed course of IV antibiotics as per infectious disease attending. 3. Aspiration pneumonia.  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  Status post ENT evaluation.  Status post modified barium swallow study on dysphagia diet.  Completed course of antibiotics.per speech pt will need repeat MBS 4. Acute blood loss anemia with GI bleed.  Recent endoscopy with peptic ulcer disease and gastritis.  On Protonix.  s/p 1 unit PRBC IV .Marland KitchenMonitor Hb and hct 5. Hypokalemia and hypophosphatemia resolved after repletion 6. Acute rhabdomyolysis improved with IV fluids 7. History of alcohol use.  Monitor for any withdrawal. 8. Hypertension on Norvasc 9. Generalized weakness PT consult appreciated.  Recommended SNF placement.  10. Social worker follow-up for SNF placement ongoing 11. Delusions or hallucinations.  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  Pt apparently has no immediate family to help make decision. CM spoke with steve(friend). No family to care for  him. APS contacted for guardian ship    Case discussed with Care Management/Social Worker.  CODE STATUS: full  DVT Prophylaxis: SCD/ambulation TOTAL TIME TAKING CARE OF THIS PATIENT: *22 minutes.  >50% time spent on counselling and coordination of care  POSSIBLE D/C IN *?* DAYS, DEPENDING ON CLINICAL  CONDITION.  Note: This dictation was prepared with Dragon dictation along with smaller phrase technology. Any transcriptional errors that result from this process are unintentional.  Fritzi Mandes M.D on 04/18/2019 at 3:22 PM  Between 7am to 6pm - Pager - [phone]  After 6pm go to [email or web address] - password [email or web address] [facility]  Office  [phone]  CC: Primary care physician; Patient, [name] PerPatient ID: George Wade, male   DOB: [birth year], 63 y.o.   MRN: [id number]

## 2019-04-18 NOTE — TOC Progression Note (Signed)
Transition of Care [facility]) - Progression Note    Patient Details  Name: George Wade MRN: [id number] Date of Birth: 09/08/1956  Transition of Care [facility]) CM/SW Contact  Shelbie Hutching, RN Phone Number: 04/18/2019, 3:42 PM  Clinical Narrative:    RNCM called APS for follow up on request for guardianship for patient.  Message left with Elaina Hoops at [phone].     Expected Discharge Plan: [place] Barriers to Discharge: Continued Medical Work up  Expected Discharge Plan and Services Expected Discharge Plan: [place] In-house Referral: Clinical Social Work Discharge Planning Services: CM Consult   Living arrangements for the past 2 months: Single Family Home                                       Social Determinants of Health (SDOH) Interventions    Readmission Risk Interventions No flowsheet data found.

## 2019-04-19 MED ORDER — TRAZODONE HCL 50 MG PO TABS
50.0000 mg | ORAL_TABLET | Freq: Every evening | ORAL | Status: DC | PRN
  Administered 2019-04-19 – 2019-04-27 (×9): 50 mg via ORAL
  Filled 2019-04-19 (×9): qty 1

## 2019-04-19 NOTE — Progress Notes (Signed)
[place] at [place] NAME: Bence Trapp    MR#:  [id number]  DATE OF BIRTH:  25-May-1956  SUBJECTIVE:   Confused at baseline. He is calm Worked with PT today REVIEW OF SYSTEMS:   Review of Systems  Unable to perform ROS: Mental status change   Tolerating Diet:yes Tolerating PT: SNF  DRUG ALLERGIES:  No Known Allergies  VITALS:  Blood pressure 113/82, pulse (!) 102, temperature 98.1 F (36.7 C), temperature source Oral, resp. rate 18, height 5\' 9"  (1.753 m), weight 83.8 kg, SpO2 97 %.  PHYSICAL EXAMINATION:   Physical Exam  GENERAL:  63 y.o.-year-old patient lying in the bed with no acute distress. Appears Chronically ill, disheveled EYES: Pupils equal, round, reactive to light and accommodation. No scleral icterus. Extraocular muscles intact.  HEENT: Head atraumatic, normocephalic. Oropharynx and nasopharynx clear.  NECK:  Supple, no jugular venous distention. No thyroid enlargement, no tenderness.  LUNGS: Normal breath sounds bilaterally, no wheezing, rales, rhonchi. No use of accessory muscles of respiration.  CARDIOVASCULAR: S1, S2 normal. No murmurs, rubs, or gallops.  ABDOMEN: Soft, nontender, nondistended. Bowel sounds present. No organomegaly or mass.  EXTREMITIES: No cyanosis, clubbing or edema b/l.    NEUROLOGIC: Cranial nerves II through XII are intact. No focal Motor or sensory deficits b/l.  Grossly intact, no focal PSYCHIATRIC:  patient is alert but disoriented SKIN: No obvious rash, lesion, or ulcer.   LABORATORY PANEL:  CBC Recent Labs  Lab 04/13/19 0605 04/14/19 0550  WBC 10.6*  --   HGB 7.0* 8.7*  HCT 26.1* 30.4*  PLT 359  --     Chemistries  Recent Labs  Lab 04/13/19 0605 04/14/19 0550  NA 140  --   K 3.8 3.6  CL 113*  --   CO2 22  --   GLUCOSE 138*  --   BUN 7*  --   CREATININE 0.57*  --   CALCIUM 7.8*  --   MG 2.2 2.1   Cardiac Enzymes No results for input(s): TROPONINI in the  last 168 hours. RADIOLOGY:  No results found. ASSESSMENT AND PLAN:  1. Status post  respiratory failure with hypoxia from multifocal pneumonia-healthcare associated pneumonia improved.  Ongoing aspiration, clinically improved.  Off BiPAP.  Off  high flow oxygen.  Currently on oxygen via nasal cannula.  Completed course of vancomycin and Unasyn and Rocephin antibiotics. 2. Staph epidermidis bacteremia resolved.  Echocardiogram negative.  Transesophageal echocardiogram deferred by cardiology.  Patient completed course of IV antibiotics as per infectious disease attending. 3. Aspiration pneumonia.  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  Status post ENT evaluation.  Status post modified barium swallow study on dysphagia diet.  Completed course of antibiotics.per speech pt will need repeat MBS 4. Acute blood loss anemia with GI bleed.  Recent endoscopy with peptic ulcer disease and gastritis.  On Protonix.  s/p 1 unit PRBC IV .Marland KitchenMonitor Hb and hct 5. Hypokalemia and hypophosphatemia resolved after repletion 6. Acute rhabdomyolysis improved with IV fluids 7. History of alcohol use.  Monitor for any withdrawal. 8. Hypertension on Norvasc 9. Generalized weakness PT consult appreciated.  Recommended SNF placement.  10. Social worker follow-up for SNF placement ongoing 11. Delusions or hallucinations.  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  Pt apparently has no immediate family to help make decision. CM spoke with steve(friend).  No family to care for him. APS contacted for guardian ship  pt medically stable for now  Case discussed with Care Management/Social Worker.  CODE STATUS: full  DVT Prophylaxis: SCD/ambulation TOTAL TIME TAKING CARE OF THIS PATIENT: *22 minutes.  >50% time spent on counselling and coordination of  care  POSSIBLE D/C IN *?* DAYS, DEPENDING ON CLINICAL CONDITION.  Note: This dictation was prepared with Dragon dictation along with smaller phrase technology. Any transcriptional errors that result from this process are unintentional.  Fritzi Mandes M.D on 04/19/2019 at 2:11 PM  Between 7am to 6pm - Pager - [phone]  After 6pm go to [email or web address] - password [email or web address] [facility]  Office  [phone]  CC: Primary care physician; Patient, [name] PerPatient ID: [name], male   DOB: November 13, 1955, 63 y.o.   MRN: [id number]

## 2019-04-19 NOTE — Progress Notes (Signed)
Physical Therapy Treatment Patient Details Name: George Wade MRN: [id number] DOB: April 10, 1956 Today's Date: 04/19/2019    History of Present Illness 63 y/o male here with progressive weakness, black/tarry stools and fall/unable to recover (outside in rain x4-5 hours); admitted for management of GIB, aspiration pna, encephalopathy and staph bacteremia. Stay complicated by CCU admission secondary to aspiration pneumonia. and alcohol withdrawl.    PT Comments    Pt had, by far, his best PT session this date with ~60 ft of ambulation (using walker, on 2L O2) as ability to be much more independent with bed mobility and transfers.  He still needs to be regularly cued to stay on task but was more appropriate and did not display hits to any hallucinations, etc this date as he has previously.   Pt still impulsive at times and fatigued relatively quickly with modest bout of ambulation but again made significant gains this date.  Pt still not appropriate to d/c home independently (unsure if there is a "home" that he can d/c to).    Follow Up Recommendations  SNF;Supervision/Assistance - 24 hour     Equipment Recommendations  Rolling walker with 5" wheels    Recommendations for Other Services       Precautions / Restrictions Precautions Precautions: Fall Restrictions Weight Bearing Restrictions: No    Mobility  Bed Mobility Overal bed mobility: Needs Assistance Bed Mobility: Supine to Sit     Supine to sit: Supervision     General bed mobility comments: Pt was able to initiate getting to EOB, did not need extra assist/cuing  Transfers Overall transfer level: Needs assistance Equipment used: Rolling walker (2 wheeled) Transfers: Sit to/from Stand Sit to Stand: Supervision         General transfer comment: Pt needed cuing for set up and sequencing but was able to rise w/o assist  Ambulation/Gait Ambulation/Gait assistance: Supervision Gait Distance (Feet): 60 Feet Assistive  device: Rolling walker (2 wheeled)       General Gait Details: Pt was better able to ambulate this date with minimal reliance on the walker and good relative safety.  He was on 2L t/o the effort with sats initially in the high 90s and dropping relatively quickly to 91% with subjective fatigue.   Pt motivated but fatigued and likely could not have done much more   Stairs             Wheelchair Mobility    Modified Rankin (Stroke Patients Only)       Balance Overall balance assessment: Needs assistance Sitting-balance support: Feet supported;Bilateral upper extremity supported Sitting balance-Leahy Scale: Good     Standing balance support: Bilateral upper extremity supported Standing balance-Leahy Scale: Fair Standing balance comment: Pt much more appropraite and safety aware during this session                            Cognition Arousal/Alertness: Awake/alert Behavior During Therapy: WFL for tasks assessed/performed Overall Cognitive Status: Difficult to assess                                 General Comments: Pt more appropriate than previous session, at times off topic but highly rengaged      Exercises General Exercises - Lower Extremity Ankle Circles/Pumps: Strengthening;10 reps Long Arc Quad: Strengthening;10 reps Heel Slides: Strengthening;10 reps Hip ABduction/ADduction: Strengthening;10 reps Straight Leg Raises: AROM;10 reps  General Comments        Pertinent Vitals/Pain Pain Assessment: No/denies pain("just sore from being in bed")    Home Living                      Prior Function            PT Goals (current goals can now be found in the care plan section) Progress towards PT goals: Progressing toward goals    Frequency    Min 2X/week      PT Plan Current plan remains appropriate    Co-evaluation              AM-PAC PT "6 Clicks" Mobility   Outcome Measure  Help needed turning from  your back to your side while in a flat bed without using bedrails?: A Little Help needed moving from lying on your back to sitting on the side of a flat bed without using bedrails?: A Little Help needed moving to and from a bed to a chair (including a wheelchair)?: A Little Help needed standing up from a chair using your arms (e.g., wheelchair or bedside chair)?: A Little Help needed to walk in hospital room?: A Little Help needed climbing 3-5 steps with a railing? : A Little 6 Click Score: 18    End of Session Equipment Utilized During Treatment: Gait belt Activity Tolerance: Patient tolerated treatment well Patient left: in chair;with chair alarm set Nurse Communication: Mobility status PT Visit Diagnosis: Muscle weakness (generalized) (M62.81);Difficulty in walking, not elsewhere classified (R26.2);History of falling (Z91.81);Unsteadiness on feet (R26.81)     Time: 1751-0258 PT Time Calculation (min) (ACUTE ONLY): 32 min  Charges:  $Gait Training: 8-22 mins $Therapeutic Exercise: 8-22 mins                     Kreg Shropshire, DPT 04/19/2019, 3:12 PM

## 2019-04-19 NOTE — TOC Progression Note (Signed)
Transition of Care [facility]) - Progression Note    Patient Details  Name: George Wade MRN: [id number] Date of Birth: Dec 20, 1955  [facility]) CM/SW Contact  Shelbie Hutching, RN Phone Number: 04/19/2019, 4:16 PM  Clinical Narrative:     Patient is doing much better and seems to have clearer thought process.   RNCM has left a message with DSS Elaina Hoops yesterday and today with no call back yet.  Message also left with Engineer, manufacturing systems.  Patient will still need guardianship.  Talked with patient about going to a group home and he seems agreeable to the prospect.  Patient will need to get a little stronger and off oxygen before discharge to go to group home.  RNCM will cont to follow up with DSS.    Expected Discharge Plan: [place] Barriers to Discharge: Continued Medical Work up  Expected Discharge Plan and Services Expected Discharge Plan: [place] In-house Referral: Clinical Social Work Discharge Planning Services: CM Consult   Living arrangements for the past 2 months: Single Family Home                                       Social Determinants of Health (SDOH) Interventions    Readmission Risk Interventions No flowsheet data found.

## 2019-04-19 NOTE — TOC Progression Note (Signed)
Transition of Care [facility]) - Progression Note    Patient Details  Name: George Wade MRN: [id number] Date of Birth: 1956/02/27  Transition of Care [facility]) CM/SW Contact  Shelbie Hutching, RN Phone Number: 04/19/2019, 4:28 PM  Clinical Narrative:    RSVP referral ID [id number].  Pasrr is still pending went to level 2.     Expected Discharge Plan: [place] Barriers to Discharge: Continued Medical Work up  Expected Discharge Plan and Services Expected Discharge Plan: [place] In-house Referral: Clinical Social Work Discharge Planning Services: CM Consult   Living arrangements for the past 2 months: Single Family Home                                       Social Determinants of Health (SDOH) Interventions    Readmission Risk Interventions No flowsheet data found.

## 2019-04-19 NOTE — Progress Notes (Signed)
Patient declined Bipap at this time. Requesting to stay on [place]. Patient resting comfortably in bed, no distress noted at this time. Will continue to monitor.

## 2019-04-20 NOTE — Progress Notes (Signed)
[place] at [place] NAME: George Wade    MR#:  [id number]  DATE OF BIRTH:  10-15-1955  SUBJECTIVE:   Confused at baseline. He is calm No new overnight events S/p PT evaluation, recommended snf REVIEW OF SYSTEMS:   Review of Systems  Unable to perform ROS: Mental status change   Tolerating Diet:yes Tolerating PT: SNF  DRUG ALLERGIES:  No Known Allergies  VITALS:  Blood pressure 103/81, pulse 93, temperature 98.4 F (36.9 C), temperature source Oral, resp. rate 18, height 5\' 9"  (1.753 m), weight 83.8 kg, SpO2 97 %.  PHYSICAL EXAMINATION:   Physical Exam  GENERAL:  63 y.o.-year-old patient lying in the bed with no acute distress. Appears Chronically ill, disheveled EYES: Pupils equal, round, reactive to light and accommodation. No scleral icterus. Extraocular muscles intact.  HEENT: Head atraumatic, normocephalic. Oropharynx and nasopharynx clear.  NECK:  Supple, no jugular venous distention. No thyroid enlargement, no tenderness.  LUNGS: Normal breath sounds bilaterally, no wheezing, rales, rhonchi. No use of accessory muscles of respiration.  CARDIOVASCULAR: S1, S2 normal. No murmurs, rubs, or gallops.  ABDOMEN: Soft, nontender, nondistended. Bowel sounds present. No organomegaly or mass.  EXTREMITIES: No cyanosis, clubbing or edema b/l.    NEUROLOGIC: Cranial nerves II through XII are intact. No focal Motor or sensory deficits b/l.  Grossly intact, no focal PSYCHIATRIC:  patient is alert but disoriented SKIN: No obvious rash, lesion, or ulcer.   LABORATORY PANEL:  CBC Recent Labs  Lab 04/14/19 0550  HGB 8.7*  HCT 30.4*    Chemistries  Recent Labs  Lab 04/14/19 0550  K 3.6  MG 2.1   Cardiac Enzymes No results for input(s): TROPONINI in the last 168 hours. RADIOLOGY:  No results found. ASSESSMENT AND PLAN:  1. Status post  respiratory failure with hypoxia from multifocal pneumonia-healthcare associated  pneumonia improved.  Ongoing aspiration, clinically improved.  Off BiPAP.  Off  high flow oxygen.  Currently on oxygen via nasal cannula.  Completed course of vancomycin and Unasyn and Rocephin antibiotics. 2. Staph epidermidis bacteremia resolved.  Echocardiogram negative.  Transesophageal echocardiogram deferred by cardiology.  Patient completed course of IV antibiotics as per infectious disease attending. 3. Aspiration pneumonia.  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  Status post ENT evaluation.  Status post modified barium swallow study on dysphagia diet.  Completed course of antibiotics.per speech pt will need repeat MBS 4. Acute blood loss anemia with GI bleed.  Recent endoscopy with peptic ulcer disease and gastritis.  On Protonix.  s/p 1 unit PRBC IV .Marland KitchenMonitor Hb and hct 5. Hypokalemia and hypophosphatemia resolved after repletion 6. Acute rhabdomyolysis improved with IV fluids 7. History of alcohol use.  Monitor for any withdrawal. 8. Hypertension on Norvasc 9. Generalized weakness PT consult appreciated.  Recommended SNF placement.  10. Social worker follow-up for SNF placement ongoing 11. Delusions or hallucinations.  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  Pt apparently has no immediate family to help make decision. CM spoke with steve(friend). No family to care for him. APS contacted for guardian ship  pt medically stable for now CSM and social worker f/u  Case discussed with Care Management/Social Worker.  CODE STATUS: full  DVT Prophylaxis: SCD/ambulation TOTAL TIME TAKING CARE OF THIS PATIENT: 23 minutes.  >50% time spent on counselling and coordination of care  POSSIBLE  D/C IN *?* DAYS, DEPENDING ON CLINICAL CONDITION.  Note: This dictation was prepared with Dragon dictation along with smaller phrase  technology. Any transcriptional errors that result from this process are unintentional.  Saundra Shelling M.D on 04/20/2019 at 11:47 AM  Between 7am to 6pm - Pager - [phone]  After 6pm go to [email or web address] - password [email or web address] [facility]  Office  [phone]  CC: Primary care physician; Patient, [name] PerPatient ID: George Wade, male   DOB: [birth year], 63 y.o.   MRN: [id number]

## 2019-04-20 NOTE — TOC Progression Note (Signed)
Transition of Care [facility]) - Progression Note    Patient Details  Name: George Wade MRN: [id number] Date of Birth: [birth year]  Transition of Care [facility]) CM/SW Contact  Batul Diego, Lenice Llamas Phone Number: [phone] 04/20/2019, 3:51 PM  Clinical Narrative: Clinical Social Worker ([place]) received a call from [place] with [employer] phone # [phone] fax # [phone]. Ivin Booty requested an update on the D/C plan when finalized. CSW faxed requested FL2 to [facility].      Expected Discharge Plan: [place] Barriers to Discharge: Continued Medical Work up  Expected Discharge Plan and Services Expected Discharge Plan: [place] In-house Referral: Clinical Social Work Discharge Planning Services: CM Consult   Living arrangements for the past 2 months: Single Family Home                                       Social Determinants of Health (SDOH) Interventions    Readmission Risk Interventions No flowsheet data found.

## 2019-04-20 NOTE — Progress Notes (Signed)
Physical Therapy Treatment Patient Details Name: George Wade MRN: [id number] DOB: Oct 31, 1955 Today's Date: 04/20/2019    History of Present Illness 63 y/o male here with progressive weakness, black/tarry stools and fall/unable to recover (outside in rain x4-5 hours); admitted for management of GIB, aspiration pna, encephalopathy and staph bacteremia. Stay complicated by CCU admission secondary to aspiration pneumonia. and alcohol withdrawl.    PT Comments    Pt showed good overall tolerance for prolonged bout of ambulation this date and though he had some fatigue he was ultimately able to tolerate the effort well.  On 2 L O2 dropped relatively quickly from mid to low 90s but stayed steady ~91% most of the time (a few brief standing rest breaks with focused breathing).  Overall he was very pleased with the amount of walking he could tolerate - cues t/o the effort to insure appropriate walker use and cadence as well as edu regarding R foot drop and cuing for veering/leaning L.  Pt showed great effort with exercises and was very receptive to performing exercises with resistance but did need rest breaks between sets/reps t/o the effort.    Follow Up Recommendations  Home health PT;Supervision/Assistance - 24 hour     Equipment Recommendations  Rolling walker with 5" wheels    Recommendations for Other Services       Precautions / Restrictions Precautions Precautions: Fall Restrictions Weight Bearing Restrictions: No    Mobility  Bed Mobility Overal bed mobility: Independent Bed Mobility: Supine to Sit;Sit to Supine     Supine to sit: Supervision Sit to supine: Supervision   General bed mobility comments: Pt able to get to/from EOB and with light assist to stabilize feet was able to scoot up in bed   Transfers Overall transfer level: Modified independent Equipment used: Rolling walker (2 wheeled) Transfers: Sit to/from Stand Sit to Stand: Min guard         General  transfer comment: First few attempts to rise from lowest bed height he struggled and after multiple attempts was able to leverage back of legs on bed to rise.  However, on next effort with cuing for set up and sequencing he was able to rise much more efficiently, confidently and safely   Ambulation/Gait Ambulation/Gait assistance: Supervision Gait Distance (Feet): 200 Feet Assistive device: Rolling walker (2 wheeled)       General Gait Details: Pt with slow but seady gait and only minimal brief rest breaks during circumambulation of the nurses' station.  He was on 2L O2 t/o the effort with sats remaining in the low 90s a majority of the time.  Mid/upper 90s pre and post ambulation.  No LOBs, but a few small bouts of veering/leaning L along with baseline R partial foot drop but no LOBs or overt safety issues.    Stairs             Wheelchair Mobility    Modified Rankin (Stroke Patients Only)       Balance Overall balance assessment: Modified Independent   Sitting balance-Leahy Scale: Good     Standing balance support: Bilateral upper extremity supported Standing balance-Leahy Scale: Good Standing balance comment: still reliant on the walker, but has shown steady improvement with standing confidence and safety                            Cognition Arousal/Alertness: Awake/alert Behavior During Therapy: WFL for tasks assessed/performed Overall Cognitive Status: Within Functional Limits for  tasks assessed                                        Exercises General Exercises - Lower Extremity Ankle Circles/Pumps: Strengthening;10 reps(eccentric R ankle DF from neutral) Quad Sets: Strengthening;10 reps Short Arc Quad: Strengthening;10 reps Heel Slides: Strengthening;10 reps(with resisted leg extensions b/l) Hip ABduction/ADduction: Strengthening;10 reps Straight Leg Raises: AROM;10 reps    General Comments        Pertinent Vitals/Pain Pain  Assessment: No/denies pain    Home Living                      Prior Function            PT Goals (current goals can now be found in the care plan section) Progress towards PT goals: Progressing toward goals    Frequency    Min 2X/week      PT Plan Current plan remains appropriate    Co-evaluation              AM-PAC PT "6 Clicks" Mobility   Outcome Measure  Help needed turning from your back to your side while in a flat bed without using bedrails?: None Help needed moving from lying on your back to sitting on the side of a flat bed without using bedrails?: None Help needed moving to and from a bed to a chair (including a wheelchair)?: A Little Help needed standing up from a chair using your arms (e.g., wheelchair or bedside chair)?: A Little Help needed to walk in hospital room?: A Little Help needed climbing 3-5 steps with a railing? : A Little 6 Click Score: 20    End of Session Equipment Utilized During Treatment: Gait belt;Oxygen(2L) Activity Tolerance: Patient tolerated treatment well Patient left: with bed alarm set;with call bell/phone within reach Nurse Communication: Mobility status(safe to walk with nursing, using walker) PT Visit Diagnosis: Muscle weakness (generalized) (M62.81);Difficulty in walking, not elsewhere classified (R26.2);History of falling (Z91.81);Unsteadiness on feet (R26.81)     Time: 0867-6195 PT Time Calculation (min) (ACUTE ONLY): 46 min  Charges:  $Gait Training: 8-22 mins $Therapeutic Exercise: 8-22 mins $Therapeutic Activity: 8-22 mins                     Kreg Shropshire, DPT 04/20/2019, 3:47 PM

## 2019-04-21 MED ORDER — QUETIAPINE FUMARATE 25 MG PO TABS
25.0000 mg | ORAL_TABLET | Freq: Two times a day (BID) | ORAL | Status: DC
  Administered 2019-04-21 – 2019-04-28 (×14): 25 mg via ORAL
  Filled 2019-04-21 (×14): qty 1

## 2019-04-21 NOTE — Progress Notes (Addendum)
[place] [facility] [place] NAME: George Wade    MR#:  [id number]  DATE OF BIRTH:  Aug 16, 1956  SUBJECTIVE:   Confused at baseline. He is calm No new overnight events S/p PT evaluation, recommended snf REVIEW OF SYSTEMS:   Review of Systems  Constitutional: Negative.   HENT: Negative.   Eyes: Negative.   Respiratory: Negative.   Cardiovascular: Negative.   Gastrointestinal: Negative.   Genitourinary: Negative.   Musculoskeletal: Negative.   Skin: Negative.   Neurological: Negative.   Endo/Heme/Allergies: Negative.   Psychiatric/Behavioral: Has some hallucinations on and off All other systems reviewed and are negative. Tolerating Diet:yes Tolerating PT: Yes  DRUG ALLERGIES:  No Known Allergies  VITALS:  Blood pressure 96/76, pulse 92, temperature 98.7 F (37.1 C), resp. rate 20, height 5\' 9"  (1.753 m), weight 83.8 kg, SpO2 97 %.  PHYSICAL EXAMINATION:   Physical Exam  GENERAL:  63 y.o.-year-old patient lying in the bed with no acute distress. Appears Chronically ill, disheveled EYES: Pupils equal, round, reactive to light and accommodation. No scleral icterus. Extraocular muscles intact.  HEENT: Head atraumatic, normocephalic. Oropharynx and nasopharynx clear.  NECK:  Supple, no jugular venous distention. No thyroid enlargement, no tenderness.  LUNGS: Normal breath sounds bilaterally, no wheezing, rales, rhonchi. No use of accessory muscles of respiration.  CARDIOVASCULAR: S1, S2 normal. No murmurs, rubs, or gallops.  ABDOMEN: Soft, nontender, nondistended. Bowel sounds present. No organomegaly or mass.  EXTREMITIES: No cyanosis, clubbing or edema b/l.    NEUROLOGIC: Cranial nerves II through XII are intact. No focal Motor or sensory deficits b/l.  Grossly intact, no focal PSYCHIATRIC:  patient is alert but disoriented sometimes SKIN: No obvious rash, lesion, or ulcer.   LABORATORY PANEL:  CBC No results for input(s):  WBC, HGB, HCT, PLT in the last 168 hours.  Chemistries  No results for input(s): NA, K, CL, CO2, GLUCOSE, BUN, CREATININE, CALCIUM, MG, AST, ALT, ALKPHOS, BILITOT in the last 168 hours.  Invalid input(s): GFRCGP Cardiac Enzymes No results for input(s): TROPONINI in the last 168 hours. RADIOLOGY:  No results found. ASSESSMENT AND PLAN:  1. Status post  respiratory failure with hypoxia from multifocal pneumonia-healthcare associated pneumonia improved.  Ongoing aspiration, clinically improved.  Off BiPAP.  Off  high flow oxygen.  Currently on oxygen via nasal cannula.  Completed course of vancomycin and Unasyn and Rocephin antibiotics. 2. Staph epidermidis bacteremia resolved.  Echocardiogram negative.  Transesophageal echocardiogram deferred by cardiology.  Patient completed course of IV antibiotics as per infectious disease attending. 3. Aspiration pneumonia.  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  Status post ENT evaluation.  Status post modified barium swallow study on dysphagia diet.  Completed course of antibiotics.per speech pt will need repeat MBS 4. Acute blood loss anemia with GI bleed.  Recent endoscopy with peptic ulcer disease and gastritis.  On Protonix.  s/p 1 unit PRBC IV .Marland KitchenMonitor Hb and hct 5. Hypokalemia and hypophosphatemia resolved after repletion 6. Acute rhabdomyolysis improved with IV fluids 7. History of alcohol use.  Monitor for any withdrawal. 8. Hypertension on Norvasc 9. Generalized weakness PT consult appreciated.  Recommended SNF placement.  10. Social worker follow-up for further evaluation of home situation 56. Delusions or hallucinations.  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  Pt apparently has no immediate family to help make decision. CM spoke  with steve(friend). No family to care for him. APS  contacted for guardian ship  pt medically stable for now CSM and social worker f/u Physical therapy reevaluated the patient and recommended home with home health services.  Check on home situation. Alternative plan-follow-up with [facility] for a group of placement  Case discussed with Care Management/Social Worker.  CODE STATUS: full  DVT Prophylaxis: SCD/ambulation TOTAL TIME TAKING CARE OF THIS PATIENT: 22 minutes.  >50% time spent on counselling and coordination of care  POSSIBLE D/C IN *?* DAYS, DEPENDING ON CLINICAL CONDITION.  Note: This dictation was prepared with Dragon dictation along with smaller phrase technology. Any transcriptional errors that result from this process are unintentional.  Saundra Shelling M.D on 04/21/2019 at 11:11 AM  Between 7am to 6pm - Pager - [phone]  After 6pm go to [email or web address] - password [email or web address] [facility]  Office  [phone]  CC: Primary care physician; Patient, [name] PerPatient ID: George Wade, male   DOB: Aug 22, 1956, 63 y.o.   MRN: [id number]

## 2019-04-22 NOTE — Progress Notes (Signed)
[facility] NAME: George Wade    MR#:  [id number]  DATE OF BIRTH:  02-14-1956  SUBJECTIVE:   Confused at baseline. He is calm No new overnight events S/p PT evaluation Weaned off oxygen REVIEW OF SYSTEMS:   Review of Systems  Constitutional: Negative.   HENT: Negative.   Eyes: Negative.   Respiratory: Negative.   Cardiovascular: Negative.   Gastrointestinal: Negative.   Genitourinary: Negative.   Musculoskeletal: Negative.   Skin: Negative.   Neurological: Negative.   Endo/Heme/Allergies: Negative.   Psychiatric/Behavioral: Has some hallucinations on and off All other systems reviewed and are negative. Tolerating Diet:yes Tolerating PT: Yes  DRUG ALLERGIES:  No Known Allergies  VITALS:  Blood pressure 97/74, pulse 98, temperature 98.4 F (36.9 C), temperature source Oral, resp. rate 20, height 5\' 9"  (1.753 m), weight 83.8 kg, SpO2 92 %.  PHYSICAL EXAMINATION:   Physical Exam  GENERAL:  63 y.o.-year-old patient lying in the bed with no acute distress. Appears Chronically ill, disheveled EYES: Pupils equal, round, reactive to light and accommodation. No scleral icterus. Extraocular muscles intact.  HEENT: Head atraumatic, normocephalic. Oropharynx and nasopharynx clear.  NECK:  Supple, no jugular venous distention. No thyroid enlargement, no tenderness.  LUNGS: Normal breath sounds bilaterally, no wheezing, rales, rhonchi. No use of accessory muscles of respiration.  CARDIOVASCULAR: S1, S2 normal. No murmurs, rubs, or gallops.  ABDOMEN: Soft, nontender, nondistended. Bowel sounds present. No organomegaly or mass.  EXTREMITIES: No cyanosis, clubbing or edema b/l.    NEUROLOGIC: Cranial nerves II through XII are intact. No focal Motor or sensory deficits b/l.  Grossly intact, no focal PSYCHIATRIC:  patient is alert but disoriented sometimes SKIN: No obvious rash, lesion, or ulcer.   LABORATORY PANEL:   CBC No results for input(s): WBC, HGB, HCT, PLT in the last 168 hours.  Chemistries  No results for input(s): NA, K, CL, CO2, GLUCOSE, BUN, CREATININE, CALCIUM, MG, AST, ALT, ALKPHOS, BILITOT in the last 168 hours.  Invalid input(s): GFRCGP Cardiac Enzymes No results for input(s): TROPONINI in the last 168 hours. RADIOLOGY:  No results found. ASSESSMENT AND PLAN:  1. Status post  respiratory failure with hypoxia from multifocal pneumonia-healthcare associated pneumonia improved.  Ongoing aspiration, clinically improved.  Off BiPAP.  Off  high flow oxygen.  Currently on oxygen via nasal cannula.  Completed course of vancomycin and Unasyn and Rocephin antibiotics. 2. Staph epidermidis bacteremia resolved.  Echocardiogram negative.  Transesophageal echocardiogram deferred by cardiology.  Patient completed course of IV antibiotics as per infectious disease attending. 3. Aspiration pneumonia.  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  Status post ENT evaluation.  Status post modified barium swallow study on dysphagia diet.  Completed course of antibiotics.per speech pt will need repeat MBS 4. Acute blood loss anemia with GI bleed.  Recent endoscopy with peptic ulcer disease and gastritis.  On Protonix.  s/p 1 unit PRBC IV .Marland KitchenMonitor Hb and hct 5. Hypokalemia and hypophosphatemia resolved after repletion 6. Acute rhabdomyolysis improved with IV fluids 7. History of alcohol use.  Monitor for any withdrawal. 8. Hypertension on Norvasc 9. Generalized weakness PT consult appreciated.  Recommended SNF placement.  10. Social worker follow-up for further evaluation of home situation 30. Delusions or hallucinations.  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  Pt apparently has no immediate family to help  make decision. CM spoke with  steve(friend). No family to care for him. APS contacted for guardian ship  pt medically stable for now CSM and social worker f/u Physical therapy reevaluated the patient and recommended home with home health services.  Check on home situation. Alternative plan-follow-up with [facility] for a group home of placement Follow up with [employer], [employer].  Case discussed with Care Management/Social Worker.  CODE STATUS: full  DVT Prophylaxis: SCD/ambulation TOTAL TIME TAKING CARE OF THIS PATIENT: 22 minutes.  >50% time spent on counselling and coordination of care  POSSIBLE D/C IN *?* DAYS, DEPENDING ON CLINICAL CONDITION.  Note: This dictation was prepared with Dragon dictation along with smaller phrase technology. Any transcriptional errors that result from this process are unintentional.  George Wade M.D on 04/22/2019 at 10:49 AM  Between 7am to 6pm - Pager - [phone]  After 6pm go to [email or web address] - password [email or web address] [facility]  Office  [phone]  CC: Primary care physician; Patient, [name] PerPatient ID: George Wade, male   DOB: 02-16-1956, 63 y.o.   MRN: [id number]

## 2019-04-23 ENCOUNTER — Inpatient Hospital Stay: Payer: Self-pay

## 2019-04-23 MED ORDER — NEPRO/CARBSTEADY PO LIQD
237.0000 mL | Freq: Two times a day (BID) | ORAL | Status: DC
  Administered 2019-04-23 – 2019-04-28 (×10): 237 mL via ORAL

## 2019-04-23 NOTE — Evaluation (Signed)
Objective Swallowing Evaluation: Type of Study: Bedside Swallow Evaluation   Patient Details  Name: George Wade MRN: [id number] Date of Birth: [birth year]  Today's Date: 04/23/2019 Time: SLP Start Time (ACUTE ONLY): 1200 -SLP Stop Time (ACUTE ONLY): 1255  SLP Time Calculation (min) (ACUTE ONLY): 55 min   Past Medical History:  Past Medical History:  Diagnosis Date  . ED (erectile dysfunction)   . Hypercholesteremia   . Hypertension   . Lung cancer [facility])    Past Surgical History:  Past Surgical History:  Procedure Laterality Date  . HERNIA REPAIR  1958  . REPLACEMENT TOTAL KNEE BILATERAL  2009  . TONSILLECTOMY  2008   HPI: Pt is a 63 y.o. male who has a history of daily alcohol drinking and alcohol withdrawal, seizure in past who states he has been having diarrhea for the last month and dark vomit since yesterday. 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  This admission, he was brought to the emergency room by EMS services after being found outside his home.  Patient reports having laid in the rain for approximately 4 hours prior to EMS services being called by his neighbor.  He reports becoming progressively weak over the last month with diarrhea as well as nausea and vomiting over the last 2 days.  Pt seemed more congested since his arrival. Coughing and then spits out rust colored sputum.  On review of patient's prior records, he has a history of GI bleed with EGD performed at [facility] on 09/29/2018 finding severe ulcerating esophagitis of the entire mid and distal esophagus. There was no evidence of varices or active esophageal bleeding. GI has assessed this pt during admission.  Pt has required admission to CCU; he remains w/ significant Cognitive/Mental Status  decline w/ poor insight and judgement into safety issues/situations. Much Confabulation is noted in his conversation - suspect much of this is directly related to the extensive ETOH use, withdrawal issues. Psychiatry is following currently.    Subjective: Pt was alert, pleasant and cooperative. Min-Mod Cognitive decline in his engagement and decisions/responses during the study(confabulation at times). Pt requires clear instruction for follow through - though, this is much improved since previous studies.     Assessment / Plan / Recommendation  CHL IP CLINICAL IMPRESSIONS 04/23/2019  Clinical Impression Pt appears to present w/ Mild-Moderate oropharyngeal phase dysphagia w/ increased risk for aspiration thus Pulmonary decline from negative sequelae of aspiration. Of concern, pt exhibited SILENT aspiration during this study; a much delayed min cough response to the aspirated material noted. Pt required verbal cues and repeated instruction for follow through w/ using strategies of f/u throat clear/cough(to aid clearing any Aspiration), as well as orally clear any remaining bolus residue.  During the oral phase, noted improved oral phase time for bolus management and A-P transfer of the boluses; little to no piecemealing of boluses occurred. Min oral residue which pt cleared when given verbal cues to use lingual sweeping and f/u, DRY swallow. During the pharyngeal phase, pt exhibited a Moderately delayed pharyngeal swallow initiation w/ bolus consistencies spilling to, and filling, the pyriform sinuses PRIOR TO initiation of the swallow - especially liquid consistencies of thin, Nectar, Honey. Pureed and minced, moistened trial consistencies appeared to trigger the pharyngeal swalllow at the level of the Valleculae, min spilling. When pt followed through  w/ instruction to use SINGLE, SMALL sips (hot coffee-sip size), same delayed pharyngeal swallow initiation noted, but no further Aspiration noted. The bolus  trials appeared to collect in both the Valleculae and Pyriform Sinuses as swalllow initiated. SUSPECT: this presentation could be premorbid as pt had Throat Cancer w/ XRT in past - this could be contributing to his delay in pharyngeal swallow initiation as well as in the decreased Laryngeal sensation. 2. Also, an EGD in 09/29/2018; there was finding of severe ulcerating Esophagitis of the entire mid and distal Esophagus related to ETOH abuse (per hospital in [place], [place]). Much less pharyngeal residue noted indicating improved pharyngeal pressure and laryngeal excursion during the swallow. Min decreased anterior movement during laryngeal excursion appreciated during the swallow. Improved BOT and laryngeal excursion noted during the swallow. With any min amount of pharyngeal residue noted in the Valleculae, pt reduced further and/or cleared it w/ the f/u, DRY swallow strategy (he was already using for the oral phase). The Esophageal phase appeared grossly [facility] for bolus movement into/through the cervical Esophagus viewable - shoulders obscured much of the view. This is an improved presentation since last MBSS. Discussed w/ MD, pt, NSG.  Diet modified per MD ok; many precaution signs posted in room in front of pt for visual cues, and precautions verbally reviewed and practiced w/ pt.   SLP Visit Diagnosis Dysphagia, oropharyngeal phase (R13.12)  Attention and concentration deficit following --  Frontal lobe and executive function deficit following --  Impact on safety and function Mild aspiration risk;Moderate aspiration risk;Risk for inadequate nutrition/hydration      CHL IP TREATMENT RECOMMENDATION 04/23/2019  Treatment Recommendations Therapy as outlined in treatment plan below     Prognosis 04/23/2019  Prognosis for Safe Diet Advancement Fair  Barriers to Reach Goals Cognitive deficits;Time post onset;Severity of deficits;Behavior  Barriers/Prognosis Comment --    CHL IP DIET RECOMMENDATION  04/23/2019  SLP Diet Recommendations Dysphagia 2 (Fine chop) solids;Nectar thick liquid  Liquid Administration via Cup;No straw  Medication Administration Whole meds with puree  Compensations Minimize environmental distractions;Small sips/bites;Slow rate;Lingual sweep for clearance of pocketing;Multiple dry swallows after each bite/sip;Follow solids with liquid;Effortful swallow;Clear throat intermittently  Postural Changes Remain semi-upright after after feeds/meals (Comment);Seated upright at 90 degrees      CHL IP OTHER RECOMMENDATIONS 04/23/2019  Recommended Consults Consider GI evaluation;Consider esophageal assessment  Oral Care Recommendations Oral care BID;Patient independent with oral care;Oral care before and after PO  Other Recommendations Order thickener from pharmacy;Prohibited food (jello, ice cream, thin soups);Remove water pitcher;Have oral suction available      CHL IP FOLLOW UP RECOMMENDATIONS 04/23/2019  Follow up Recommendations Skilled Nursing facility      [facility] IP FREQUENCY AND DURATION 04/23/2019  Speech Therapy Frequency (ACUTE ONLY) min 2x/week  Treatment Duration 1 week           CHL IP ORAL PHASE 04/23/2019  Oral Phase Impaired  Oral - Pudding Teaspoon --  Oral - Pudding Cup --  Oral - Honey Teaspoon --  Oral - Honey Cup --  Oral - Nectar Teaspoon --  Oral - Nectar Cup --  Oral - Nectar Straw --  Oral - Thin Teaspoon --  Oral - Thin Cup --  Oral - Thin Straw --  Oral - Puree --  Oral - Mech Soft --  Oral - Regular --  Oral - Multi-Consistency --  Oral - Pill --  Oral Phase - Comment noted premature spillage of bolus material into the pharynx - decreased  oral control. Min delay in organization of bolus material for A-P transfer for swallow; min+ oral residue post swallow - diffuse. Pt utilized a f/u, DRY swallow which appeared to fairly adequately clear the oral residue - no buildup of residue noted as trials continued.     CHL IP PHARYNGEAL PHASE  04/23/2019  Pharyngeal Phase Impaired  Pharyngeal- Pudding Teaspoon --  Pharyngeal --  Pharyngeal- Pudding Cup --  Pharyngeal --  Pharyngeal- Honey Teaspoon --  Pharyngeal --  Pharyngeal- Honey Cup --  Pharyngeal --  Pharyngeal- Nectar Teaspoon --  Pharyngeal --  Pharyngeal- Nectar Cup --  Pharyngeal --  Pharyngeal- Nectar Straw --  Pharyngeal --  Pharyngeal- Thin Teaspoon --  Pharyngeal --  Pharyngeal- Thin Cup --  Pharyngeal --  Pharyngeal- Thin Straw --  Pharyngeal --  Pharyngeal- Puree --  Pharyngeal --  Pharyngeal- Mechanical Soft --  Pharyngeal --  Pharyngeal- Regular --  Pharyngeal --  Pharyngeal- Multi-consistency --  Pharyngeal --  Pharyngeal- Pill --  Pharyngeal --  Pharyngeal Comment noted delayed pharyngeal swallow initiation w/ all trial consistencies: thin, Nectar, Honey, purees, soft solids. Bolus trials spilled to, and liquid filled, the pyriform sinuses PRIOR to swallow initiation. This resulted in SILENT Aspiration of thin and Nectar consistency liquids(larger bolus of Nectar). When pt followed through w/ instruction to use SINGLE, SMALL sips (hot coffee-sip size), same delayed pharyngeal swallow initiation noted, but no further Aspiration noted. The bolus trials appeared to collect in both the Valleculae and Pyriform Sinuses as swalllow initiated.  Much less pharyngeal residue noted post swallow; more effective BOT and laryngeal excursion noted during the swallow. With any min amount of pharyngeal residue noted in the Valleculae, pt reduced further and/or cleared it w/ the f/u, DRY swallow strategy (he was already using for the oral phase).      CHL IP CERVICAL ESOPHAGEAL PHASE 04/23/2019  Cervical Esophageal Phase WFL  Pudding Teaspoon --  Pudding Cup --  Honey Teaspoon --  Honey Cup --  Nectar Teaspoon --  Nectar Cup --  Nectar Straw --  Thin Teaspoon --  Thin Cup --  Thin Straw --  Puree --  Mechanical Soft --  Regular --  Multi-consistency --   Pill --  Cervical Esophageal Comment no overt deficits noted; no impeding of bolus motility through the cervical esophagus        Orinda Kenner, MS, CCC-SLP George Wade 04/23/2019, 4:52 PM

## 2019-04-23 NOTE — Progress Notes (Signed)
Physical Therapy Treatment Patient Details Name: George Wade MRN: [id number] DOB: 1956/02/08 Today's Date: 04/23/2019    History of Present Illness 63 y/o male here with progressive weakness, black/tarry stools and fall/unable to recover (outside in rain x4-5 hours); admitted for management of GIB, aspiration pna, encephalopathy and staph bacteremia. Stay complicated by CCU admission secondary to aspiration pneumonia. and alcohol withdrawl.    PT Comments    Pt ready for session.  Reports feeling well.  Bed mobility without assist but uses rails.  Stood with min guard and was able to ambulate x 2 laps with walker and min guard.  Pt on 2 lp at rest 94% and after gait on 2 lpm only decreased to 92% with no SOB noted.  Foot drop improved with session with only minor deficits noted and did not interfere with gait or safety.  Returned to bed as he stated the chair was not comfortable for him.   Follow Up Recommendations  Home health PT;Supervision/Assistance - 24 hour     Equipment Recommendations  Rolling walker with 5" wheels    Recommendations for Other Services       Precautions / Restrictions Precautions Precautions: Fall Restrictions Weight Bearing Restrictions: No    Mobility  Bed Mobility Overal bed mobility: Modified Independent       Supine to sit: Modified independent (Device/Increase time) Sit to supine: Modified independent (Device/Increase time)   General bed mobility comments: with rails but no assist  Transfers Overall transfer level: Needs assistance Equipment used: Rolling walker (2 wheeled) Transfers: Sit to/from Stand Sit to Stand: Min guard            Ambulation/Gait Ambulation/Gait assistance: Min guard Gait Distance (Feet): 380 Feet Assistive device: Rolling walker (2 wheeled) Gait Pattern/deviations: Step-through pattern;Decreased step length - right;Decreased step length - left Gait velocity: decreased   General Gait Details: no  noticable foot drop today.  Sats 92-94% on 2lpm   Stairs             Wheelchair Mobility    Modified Rankin (Stroke Patients Only)       Balance Overall balance assessment: Needs assistance Sitting-balance support: Feet supported Sitting balance-Leahy Scale: Good     Standing balance support: Bilateral upper extremity supported Standing balance-Leahy Scale: Fair Standing balance comment: reliant on walker but generally steady with good safety                            Cognition Arousal/Alertness: Awake/alert Behavior During Therapy: WFL for tasks assessed/performed Overall Cognitive Status: Within Functional Limits for tasks assessed                                        Exercises      General Comments        Pertinent Vitals/Pain Pain Assessment: No/denies pain    Home Living                      Prior Function            PT Goals (current goals can now be found in the care plan section) Progress towards PT goals: Progressing toward goals    Frequency    Min 2X/week      PT Plan Current plan remains appropriate    Co-evaluation  AM-PAC PT "6 Clicks" Mobility   Outcome Measure  Help needed turning from your back to your side while in a flat bed without using bedrails?: None Help needed moving from lying on your back to sitting on the side of a flat bed without using bedrails?: None Help needed moving to and from a bed to a chair (including a wheelchair)?: A Little Help needed standing up from a chair using your arms (e.g., wheelchair or bedside chair)?: A Little Help needed to walk in hospital room?: A Little Help needed climbing 3-5 steps with a railing? : A Little 6 Click Score: 20    End of Session Equipment Utilized During Treatment: Gait belt;Oxygen Activity Tolerance: Patient tolerated treatment well Patient left: with bed alarm set;with call bell/phone within reach;in bed          Time: 0245-0300 PT Time Calculation (min) (ACUTE ONLY): 15 min  Charges:  $Gait Training: 8-22 mins                     Chesley Noon, PTA 04/23/19, 3:11 PM

## 2019-04-23 NOTE — TOC Progression Note (Signed)
Transition of Care [facility]) - Progression Note    Patient Details  Name: George Wade MRN: [id number] Date of Birth: Nov 15, 1955  Transition [facility] Care [facility]) CM/SW Contact  Shelbie Hutching, RN Phone Number: 04/23/2019, 3:22 PM  Clinical Narrative:    Patient is interested in getting an appartment or renting a home in the [place], [place] area.  Instructed patient to call the [place] and the [employer].  Patient reports that he could put down $750 and pay about that in rent.  RNCM instructed patient to start looking.  Patient has a MacBook in his room that he reports he bought over the weekend from [employer] "they were on sale". Patient reports he spent $900 on the Mac Book.  No information received from DSS about guardianship.  RNCM will work on finding PCP for patient.     Expected Discharge Plan: [place] Barriers to Discharge: Continued Medical Work up  Expected Discharge Plan and Services Expected Discharge Plan: [place] In-house Referral: Clinical Social Work Discharge Planning Services: CM Consult   Living arrangements for the past 2 months: Single Family Home                                       Social Determinants of Health (SDOH) Interventions    Readmission Risk Interventions No flowsheet data found.

## 2019-04-23 NOTE — TOC Progression Note (Signed)
Transition of Care [facility]) - Progression Note    Patient Details  Name: George Wade MRN: [id number] Date of Birth: 11/22/1955  Transition of Care [facility]) CM/SW Contact  Shelbie Hutching, RN Phone Number: 04/23/2019, 8:41 AM  Clinical Narrative:    This RNCM spoke with Virgilio Belling from [place] this morning about guardianship status.  Virgilio Belling reports that [name] is the case worker and she will call RNCM today.  Guardianship process can take up to 45 days.     Expected Discharge Plan: [place] Barriers to Discharge: Continued Medical Work up  Expected Discharge Plan and Services Expected Discharge Plan: [place] In-house Referral: Clinical Social Work Discharge Planning Services: CM Consult   Living arrangements for the past 2 months: Single Family Home                                       Social Determinants of Health (SDOH) Interventions    Readmission Risk Interventions No flowsheet data found.

## 2019-04-23 NOTE — Progress Notes (Addendum)
Nutrition Follow-up  DOCUMENTATION CODES:   Not applicable  INTERVENTION:   Nepro Shake po BID, each supplement provides 425 kcal and 19 grams protein  Provide Magic cup TID with meals, each supplement provides 290 kcal and 9 grams of protein.  NUTRITION DIAGNOSIS:   Inadequate oral intake related to acute illness(GIB) as evidenced by NPO status.  Resolving.  GOAL:   Patient will meet greater than or equal to 90% of their needs  Progressing.  MONITOR:   PO intake, Supplement acceptance, Labs, Weight trends, Skin, I & O's  ASSESSMENT:   63 y.o. 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 Pt found to have aspiration PNA and GIB   Pt with improved appetite and oral intake; pt eating 100% of meals in hospital. Pt being followed by SLP and upgraded to dysphagia 2/nectar thick diet today. Pt eating Magic Cups that are being sent on meal trays. Refeed labs stable. No new weight since 6/28; will request weekly weights.   Medications reviewed and include: folic acid, MVI, protonix, niacin, Kphos, thiamine, vitamin C  Labs reviewed: K 3.6 wnl, P 3.1 wnl, Mg 2.1 wnl- 7/4  Diet Order:   Diet Order            DIET - DYS 1 Room service appropriate? Yes; Fluid consistency: Honey Thick  Diet effective now             EDUCATION NEEDS:   No education needs have been identified at this time  Skin:  Skin Assessment: Skin Integrity Issues:(MSAD to groin)  Last BM:  7/12  Height:   Ht Readings from Last 1 Encounters:  03/31/19 5\' 9"  (1.753 m)   Weight:   Wt Readings from Last 1 Encounters:  04/08/19 83.8 kg   Ideal Body Weight:  72.7 kg  BMI:  Body mass index is 27.28  kg/m.  Estimated Nutritional Needs:   Kcal:  1900-2200kcal/day  Protein:  95-110g/day  Fluid:  >2.1L/day  Koleen Distance MS, RD, LDN Pager #- [phone] Office#- [phone] After Hours Pager: [phone]

## 2019-04-23 NOTE — Progress Notes (Signed)
[place] at [place] NAME: George Wade    MR#:  [id number]  DATE OF BIRTH:  14-Mar-1956  SUBJECTIVE:   Confused at baseline. He is calm No new overnight events S/p PT evaluation Weaned off oxygen Has some issues with his dysphagia diet REVIEW OF SYSTEMS:   Review of Systems  Constitutional: Negative.   HENT: Negative.   Eyes: Negative.   Respiratory: Negative.   Cardiovascular: Negative.   Gastrointestinal: Negative.   Genitourinary: Negative.   Musculoskeletal: Negative.   Skin: Negative.   Neurological: Negative.   Endo/Heme/Allergies: Negative.   Psychiatric/Behavioral: Has some hallucinations on and off All other systems reviewed and are negative. Tolerating Diet:yes Tolerating PT: Yes  DRUG ALLERGIES:  No Known Allergies  VITALS:  Blood pressure 106/87, pulse 93, temperature 98.4 F (36.9 C), temperature source Oral, resp. rate 18, height 5\' 9"  (1.753 m), weight 83.8 kg, SpO2 94 %.  PHYSICAL EXAMINATION:   Physical Exam  GENERAL:  63 y.o.-year-old patient lying in the bed with no acute distress. Appears Chronically ill, disheveled EYES: Pupils equal, round, reactive to light and accommodation. No scleral icterus. Extraocular muscles intact.  HEENT: Head atraumatic, normocephalic. Oropharynx and nasopharynx clear.  NECK:  Supple, no jugular venous distention. No thyroid enlargement, no tenderness.  LUNGS: Normal breath sounds bilaterally, no wheezing, rales, rhonchi. No use of accessory muscles of respiration.  CARDIOVASCULAR: S1, S2 normal. No murmurs, rubs, or gallops.  ABDOMEN: Soft, nontender, nondistended. Bowel sounds present. No organomegaly or mass.  EXTREMITIES: No cyanosis, clubbing or edema b/l.    NEUROLOGIC: Cranial nerves II through XII are intact. No focal Motor or sensory deficits b/l.  Grossly intact, no focal PSYCHIATRIC:  patient is alert but disoriented sometimes SKIN: No obvious rash,  lesion, or ulcer.   LABORATORY PANEL:  CBC No results for input(s): WBC, HGB, HCT, PLT in the last 168 hours.  Chemistries  No results for input(s): NA, K, CL, CO2, GLUCOSE, BUN, CREATININE, CALCIUM, MG, AST, ALT, ALKPHOS, BILITOT in the last 168 hours.  Invalid input(s): GFRCGP Cardiac Enzymes No results for input(s): TROPONINI in the last 168 hours. RADIOLOGY:  No results found. ASSESSMENT AND PLAN:  1. Status post  respiratory failure with hypoxia from multifocal pneumonia-healthcare associated pneumonia improved.  Ongoing aspiration, clinically improved.  Off BiPAP.  Off  high flow oxygen.  Currently on oxygen via nasal cannula.  Completed course of vancomycin and Unasyn and Rocephin antibiotics. 2. Staph epidermidis bacteremia resolved.  Echocardiogram negative.  Transesophageal echocardiogram deferred by cardiology.  Patient completed course of IV antibiotics as per infectious disease attending. 3. Aspiration pneumonia.  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  Status post ENT evaluation.  Status post modified barium swallow study on dysphagia diet.  Completed course of antibiotics.per speech pt will need repeat MBS 4. Acute blood loss anemia with GI bleed.  Recent endoscopy with peptic ulcer disease and gastritis.  On Protonix.  s/p 1 unit PRBC IV .Marland KitchenMonitor Hb and hct 5. Hypokalemia and hypophosphatemia resolved after repletion 6. Acute rhabdomyolysis improved with IV fluids 7. History of alcohol use.  Monitor for any withdrawal. 8. Hypertension on Norvasc 9. Generalized weakness PT consult appreciated.  Recommended SNF placement.  10. Social worker follow-up for further evaluation of home situation 36. Delusions or hallucinations.  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  Pt  apparently has no immediate family to help  make decision. CM spoke with steve(friend). No family to care for him. APS contacted for guardian ship  pt medically stable for now CSM and social worker f/u Physical therapy reevaluated the patient and recommended home with home health services.  Unfortunately unable to send home considering home situation. Alternative plan-follow-up with [facility] for a group home of placement CSM f/u for guardian ship for possible placement in group home Follow up with social worker.  Case discussed with Care Management/Social Worker.  CODE STATUS: full  DVT Prophylaxis: SCD/ambulation TOTAL TIME TAKING CARE OF THIS PATIENT: 21 minutes.  >50% time spent on counselling and coordination of care  POSSIBLE D/C IN *?* DAYS, DEPENDING ON CLINICAL CONDITION.  Note: This dictation was prepared with Dragon dictation along with smaller phrase technology. Any transcriptional errors that result from this process are unintentional.  Saundra Shelling M.D on 04/23/2019 at 12:02 PM  Between 7am to 6pm - Pager - [phone]  After 6pm go to [email or web address] - password [email or web address] [facility]  Office  [phone]  CC: Primary care physician; Patient, [name] PerPatient ID: George Wade, male   DOB: [birth year], 63 y.o.   MRN: [id number]

## 2019-04-24 ENCOUNTER — Inpatient Hospital Stay: Payer: Self-pay

## 2019-04-24 NOTE — TOC Progression Note (Signed)
Transition of Care [facility]) - Progression Note    Patient Details  Name: Aidenn Skellenger MRN: [id number] Date of Birth: 11/13/1955  Transition of Care [facility]) CM/SW Contact  Shelbie Hutching, RN Phone Number: 04/24/2019, 2:43 PM  Clinical Narrative:     RNCM spoke with patient today about finding a place to live.  Patient expressed interest in the group home atmosphere after talking with the doctor this morning. He will still reach out to [employer] and [employer], he reports he did not have time yesterday to call them.     RNCM reached out to [facility] and they will not accept patient because of alcohol use.  RNCM will cont to reach out for assistance with housing for patient.   Expected Discharge Plan: [place] Barriers to Discharge: Continued Medical Work up  Expected Discharge Plan and Services Expected Discharge Plan: [place] In-house Referral: Clinical Social Work Discharge Planning Services: CM Consult   Living arrangements for the past 2 months: Single Family Home                                       Social Determinants of Health (SDOH) Interventions    Readmission Risk Interventions No flowsheet data found.

## 2019-04-24 NOTE — Progress Notes (Addendum)
[place] at [place] NAME: George Wade    MR#:  [id number]  DATE OF BIRTH:  02-Feb-1956  SUBJECTIVE:   Confused at baseline. He is calm No new overnight events S/p PT evaluation Weaned off oxygen Diet has been upgraded by speech therapy and patient is happy with that Bought macbook in last two days and surfing internet REVIEW OF SYSTEMS:   Review of Systems  Constitutional: Negative.   HENT: Negative.   Eyes: Negative.   Respiratory: Negative.   Cardiovascular: Negative.   Gastrointestinal: Negative.   Genitourinary: Negative.   Musculoskeletal: Negative.   Skin: Negative.   Neurological: Negative.   Endo/Heme/Allergies: Negative.   Psychiatric/Behavioral: Has some hallucinations on and off All other systems reviewed and are negative. Tolerating Diet:yes Tolerating PT: Yes  DRUG ALLERGIES:  No Known Allergies  VITALS:  Blood pressure (!) 121/101, pulse 95, temperature 99 F (37.2 C), temperature source Oral, resp. rate 16, height 5\' 9"  (1.753 m), weight 83.8 kg, SpO2 99 %.  PHYSICAL EXAMINATION:   Physical Exam  GENERAL:  63 y.o.-year-old patient lying in the bed with no acute distress. Appears Chronically ill, disheveled EYES: Pupils equal, round, reactive to light and accommodation. No scleral icterus. Extraocular muscles intact.  HEENT: Head atraumatic, normocephalic. Oropharynx and nasopharynx clear.  NECK:  Supple, no jugular venous distention. No thyroid enlargement, no tenderness.  LUNGS: Normal breath sounds bilaterally, no wheezing, rales, rhonchi. No use of accessory muscles of respiration.  CARDIOVASCULAR: S1, S2 normal. No murmurs, rubs, or gallops.  ABDOMEN: Soft, nontender, nondistended. Bowel sounds present. No organomegaly or mass.  EXTREMITIES: No cyanosis, clubbing or edema b/l.    NEUROLOGIC: Cranial nerves II through XII are intact. No focal Motor or sensory deficits b/l.  Grossly intact, no  focal PSYCHIATRIC:  patient is alert but disoriented sometimes SKIN: No obvious rash, lesion, or ulcer.   LABORATORY PANEL:  CBC No results for input(s): WBC, HGB, HCT, PLT in the last 168 hours.  Chemistries  No results for input(s): NA, K, CL, CO2, GLUCOSE, BUN, CREATININE, CALCIUM, MG, AST, ALT, ALKPHOS, BILITOT in the last 168 hours.  Invalid input(s): GFRCGP Cardiac Enzymes No results for input(s): TROPONINI in the last 168 hours. RADIOLOGY:  No results found. ASSESSMENT AND PLAN:  1. Status post  respiratory failure with hypoxia from multifocal pneumonia-healthcare associated pneumonia improved.  Ongoing aspiration, clinically improved.  Off BiPAP.  Off  high flow oxygen.  Currently on oxygen via nasal cannula.  Completed course of vancomycin and Unasyn and Rocephin antibiotics. 2. Staph epidermidis bacteremia resolved.  Echocardiogram negative.  Transesophageal echocardiogram deferred by cardiology.  Patient completed course of IV antibiotics as per infectious disease attending. 3. Aspiration pneumonia.  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  Status post ENT evaluation.  Status post modified barium swallow study on dysphagia diet.  Completed course of antibiotics.per speech pt will need repeat MBS 4. Acute blood loss anemia with GI bleed.  Recent endoscopy with peptic ulcer disease and gastritis.  On Protonix.  s/p 1 unit PRBC IV .Marland KitchenMonitor Hb and hct 5. Hypokalemia and hypophosphatemia resolved after repletion 6. Acute rhabdomyolysis improved with IV fluids 7. History of alcohol use.  Monitor for any withdrawal. 8. Hypertension on Norvasc 9. Generalized weakness PT consult appreciated.  Recommended SNF placement.  10. Social worker follow-up for further evaluation of home situation 48. Delusions or hallucinations.  Could be from alcohol  related organic brain syndrome?dementia/ wernicke's encephalopathy -  psychiatry  consult done--recommends to cont seroquel.  Pt apparently has no immediate family to help make decision. CM spoke with steve(friend). No family to care for him. APS contacted for guardian ship  pt medically stable for now CSM and social worker f/u Physical therapy reevaluated the patient and recommended home with home health services.  Case management discussed with the patient in detail yesterday Patient advised to look for home and monitor him and call [facility] [employer] We will follow-up with case management regarding home situation If patient has a home we can discharge to home with home health services, work in progress. Mobile housing rentals and [place] on search  Case discussed with Care Automotive engineer.  CODE STATUS: full  DVT Prophylaxis: SCD/ambulation TOTAL TIME TAKING CARE OF THIS PATIENT: 21 minutes.  >50% time spent on counselling and coordination of care  POSSIBLE D/C IN *?* DAYS, DEPENDING ON CLINICAL CONDITION.  Note: This dictation was prepared with Dragon dictation along with smaller phrase technology. Any transcriptional errors that result from this process are unintentional.  Saundra Shelling M.D on 04/24/2019 at 10:32 AM  Between 7am to 6pm - Pager - [phone]  After 6pm go to [email or web address] - password [email or web address] [place] [facility]  Office  [phone]  CC: Primary care physician; Patient, [name] PerPatient ID: George Wade, male   DOB: 1955/11/16, 63 y.o.   MRN: [id number]

## 2019-04-24 NOTE — TOC Progression Note (Signed)
Transition of Care [facility]) - Progression Note    Patient Details  Name: Shaunn Tackitt MRN: [id number] Date of Birth: 05/21/1956  Transition of Care [facility]) CM/SW Contact  Shelbie Hutching, RN Phone Number: 04/24/2019, 4:14 PM  Clinical Narrative:    RNCM discussed with patient about going to an addiction treatment facility, patient reports he does not want to consider this unless it is a final option.  Patient reports he does not want to be in a restrictive environment, he wants to come and go as he pleases.  RNCM will search for mobile home rentals online tonight and he reports he left a message with the [employer].  RNCM will follow up with patient in the morning.    Expected Discharge Plan: [place] Barriers to Discharge: Continued Medical Work up  Expected Discharge Plan and Services Expected Discharge Plan: [place] In-house Referral: Clinical Social Work Discharge Planning Services: CM Consult   Living arrangements for the past 2 months: Single Family Home                                       Social Determinants of Health (SDOH) Interventions    Readmission Risk Interventions No flowsheet data found.

## 2019-04-25 NOTE — Progress Notes (Addendum)
[place] at [place] NAME: George Wade    MR#:  [id number]  DATE OF BIRTH:  May 25, 1956  SUBJECTIVE:   No acute events overnight.  Pt. Surfing the web on his [place].  Awaiting placement.   REVIEW OF SYSTEMS:   Review of Systems  Constitutional: Negative.   HENT: Negative.   Eyes: Negative.   Respiratory: Negative.   Cardiovascular: Negative.   Gastrointestinal: Negative.   Genitourinary: Negative.   Musculoskeletal: Negative.   Skin: Negative.   Neurological: Negative.   Endo/Heme/Allergies: Negative.   Psychiatric/Behavioral: Has some hallucinations on and off All other systems reviewed and are negative. Tolerating Diet:yes Tolerating PT: Yes  DRUG ALLERGIES:  No Known Allergies  VITALS:  Blood pressure 99/76, pulse 92, temperature 98.5 F (36.9 C), temperature source Oral, resp. rate 16, height 5\' 9"  (1.753 m), weight 83.8 kg, SpO2 96 %.  PHYSICAL EXAMINATION:   Physical Exam  GENERAL:  63 y.o.-year-old patient lying in the bed with no acute distress. Appears Chronically ill, disheveled EYES: Pupils equal, round, reactive to light and accommodation. No scleral icterus. Extraocular muscles intact.  HEENT: Head atraumatic, normocephalic. Oropharynx and nasopharynx clear.  NECK:  Supple, no jugular venous distention. No thyroid enlargement, no tenderness.  LUNGS: Normal breath sounds bilaterally, no wheezing, rales, rhonchi. No use of accessory muscles of respiration.  CARDIOVASCULAR: S1, S2 normal. No murmurs, rubs, or gallops.  ABDOMEN: Soft, nontender, nondistended. Bowel sounds present. No organomegaly or mass.  EXTREMITIES: No cyanosis, clubbing or edema b/l.    NEUROLOGIC: Cranial nerves II through XII are intact. No focal Motor or sensory deficits b/l.  Grossly intact, no focal PSYCHIATRIC:  patient is alert but disoriented at times. SKIN: No obvious rash, lesion, or ulcer.   LABORATORY PANEL:  CBC No  results for input(s): WBC, HGB, HCT, PLT in the last 168 hours.  Chemistries  No results for input(s): NA, K, CL, CO2, GLUCOSE, BUN, CREATININE, CALCIUM, MG, AST, ALT, ALKPHOS, BILITOT in the last 168 hours.  Invalid input(s): GFRCGP Cardiac Enzymes No results for input(s): TROPONINI in the last 168 hours. RADIOLOGY:  Dg Chest 2 View  Result Date: 04/24/2019 CLINICAL DATA:  Follow-up pneumonia EXAM: CHEST - 2 VIEW COMPARISON:  04/08/2019 FINDINGS: Cardiac shadow is stable. Aortic calcifications are again seen. Improved aeration is noted bilaterally with some residual infiltrate in the right upper lobe identified. No sizable effusion is seen. No bony abnormality is noted. IMPRESSION: Improved aeration although persistent infiltrate is noted in the right upper lobe. Electronically Signed   By: Inez Catalina M.D.   On: 04/24/2019 13:38   ASSESSMENT AND PLAN:  1. Status post  respiratory failure with hypoxia from multifocal pneumonia-healthcare associated pneumonia improved.  Ongoing aspiration, clinically improved.  Off BiPAP.  Off  high flow oxygen.  Currently on oxygen via nasal cannula.  Completed course of vancomycin and Unasyn and Rocephin antibiotics. 2. Staph epidermidis bacteremia resolved.  Echocardiogram negative.  Transesophageal echocardiogram deferred by cardiology.  Patient completed course of IV antibiotics as per infectious disease attending. 3. Aspiration pneumonia.  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  Status post ENT evaluation.  Status post modified barium swallow study on dysphagia diet.  Completed course of antibiotics.per speech pt will need repeat MBS. 4. Acute blood loss anemia with GI bleed.  Recent endoscopy with peptic ulcer disease and gastritis.  On Protonix.  s/p 1 unit PRBC IV .Marland KitchenMonitor  Hb and hct 5. Hypokalemia and hypophosphatemia resolved after repletion 6. Acute rhabdomyolysis improved with IV  fluids 7. History of alcohol use.  Monitor for any withdrawal. 8. Hypertension on Norvasc. 9. Generalized weakness PT consult appreciated.  Recommended SNF placement.  10. Delusions or hallucinations.  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  Pt apparently has no immediate family to help make decision. CM spoke with steve(friend). No family to care for him. APS contacted for guardian ship  pt medically stable for now CSM and social worker f/u Physical therapy reevaluated the patient and recommended home with home health services.  Case management discussed with the patient in detail yesterday Patient advised to look for home and monitor him and call [facility] [employer] We will follow-up with case management regarding home situation If patient has a home we can discharge to home with home health services, work in progress. Mobile housing rentals and [place] on search  Case discussed with Care Automotive engineer.  CODE STATUS: full  DVT Prophylaxis: SCD/ambulation  TOTAL TIME TAKING CARE OF THIS PATIENT: 25 minutes.   >50% time spent on counselling and coordination of care  POSSIBLE D/C IN *?* DAYS, DEPENDING ON CLINICAL CONDITION.  Note: This dictation was prepared with Dragon dictation along with smaller phrase technology. Any transcriptional errors that result from this process are unintentional.  Henreitta Leber M.D on 04/25/2019 at 2:40 PM  Between 7am to 6pm - Pager - [phone]  After 6pm go to [email or web address] - password [email or web address] [place] [facility]  Office  [phone]  CC: Primary care physician; Patient, [name] PerPatient ID: George Wade, male   DOB: 1956/05/16, 63 y.o.   MRN: [id number]

## 2019-04-25 NOTE — TOC Progression Note (Signed)
Transition of Care [facility]) - Progression Note    Patient Details  Name: George Wade MRN: [id number] Date of Birth: [birth year]  [facility]) CM/SW Contact  Shelbie Hutching, RN Phone Number: 04/25/2019, 9:49 AM  Clinical Narrative:    RNCM heard from [name] with DSS about guardianship case for patient.  Charlena Cross reports that she has requested medical records from the hospital but has not gotten them yet.  Ebony also requests that a mini mental status exam be completed and sent back to her.  RNCM will complete mini mental today after patient works with physical therapy.    Expected Discharge Plan: [place] Barriers to Discharge: Continued Medical Work up  Expected Discharge Plan and Services Expected Discharge Plan: [place] In-house Referral: Clinical Social Work Discharge Planning Services: CM Consult   Living arrangements for the past 2 months: Single Family Home                                       Social Determinants of Health (SDOH) Interventions    Readmission Risk Interventions No flowsheet data found.

## 2019-04-25 NOTE — TOC Progression Note (Signed)
Transition of Care [facility]) - Progression Note    Patient Details  Name: Srihari Shellhammer MRN: [id number] Date of Birth: 1956-03-11  Transition of Care [facility]) CM/SW Contact  Shelbie Hutching, RN Phone Number: 04/25/2019, 10:30 AM  Clinical Narrative:    Patient is lying in bed with eyes closed, he reports he did look on line for housing and said that he made some calls.  RNCM mentioned that patient could stay in a hotel until he finds adequate housing, patient reports he is not interested in that.  RNCM told patient that it is time for him to be discharged and he needs to have a plan.  Patient requests to that [facility] comes back tomorrow.     Expected Discharge Plan: [place] Barriers to Discharge: Continued Medical Work up  Expected Discharge Plan and Services Expected Discharge Plan: [place] In-house Referral: Clinical Social Work Discharge Planning Services: CM Consult   Living arrangements for the past 2 months: Single Family Home                                       Social Determinants of Health (SDOH) Interventions    Readmission Risk Interventions No flowsheet data found.

## 2019-04-25 NOTE — Progress Notes (Addendum)
Physical Therapy Treatment Patient Details Name: George Wade MRN: [id number] DOB: [birth year] Today's Date: 04/25/2019    History of Present Illness 63 y/o male here with progressive weakness, black/tarry stools and fall/unable to recover (outside in rain x4-5 hours); admitted for management of GIB, aspiration pna, encephalopathy and staph bacteremia. Stay complicated by CCU admission secondary to aspiration pneumonia. and alcohol withdrawl.    PT Comments    Pt is making good progress towards goals with improved ambulation distance. He still is confused to some orientation questions. All mobility performed on room air with sats at 95% pre and 90% post exertion. HR at 125 post exertion. He reports he is very fatigued after ambulation, requesting to place 2L of O2 back on. Good endurance with there-ex. Pt concerned about discharge plan, where he will live after hospital stay and the effort it will take to move locations. Tried to ease concerns and promote energy conservation. Will continue to progress. Goals updated this date.  Follow Up Recommendations  Home health PT;Supervision/Assistance - 24 hour     Equipment Recommendations  Rolling walker with 5" wheels    Recommendations for Other Services       Precautions / Restrictions Precautions Precautions: Fall Restrictions Weight Bearing Restrictions: No    Mobility  Bed Mobility Overal bed mobility: Modified Independent Bed Mobility: Supine to Sit;Sit to Supine     Supine to sit: Modified independent (Device/Increase time) Sit to supine: Modified independent (Device/Increase time)   General bed mobility comments: with rails but no assist  Transfers Overall transfer level: Needs assistance Equipment used: Rolling walker (2 wheeled) Transfers: Sit to/from Stand Sit to Stand: Min guard         General transfer comment: cues to push from seated surface  Ambulation/Gait Ambulation/Gait assistance: Min guard Gait  Distance (Feet): 325 Feet Assistive device: Rolling walker (2 wheeled) Gait Pattern/deviations: Step-through pattern;Decreased step length - right;Decreased step length - left Gait velocity: decreased   General Gait Details: improved gait sequencing. All mobility performed on RA with increased SOB symptoms at end of gait. R foot drop improved   Stairs             Wheelchair Mobility    Modified Rankin (Stroke Patients Only)       Balance Overall balance assessment: Needs assistance Sitting-balance support: Feet supported Sitting balance-Leahy Scale: Good     Standing balance support: Bilateral upper extremity supported Standing balance-Leahy Scale: Good                              Cognition Arousal/Alertness: Awake/alert Behavior During Therapy: WFL for tasks assessed/performed Overall Cognitive Status: Within Functional Limits for tasks assessed                                        Exercises Other Exercises Other Exercises: supine ther-ex performed including B SLRs, hip abd/add, and heel slides. All ther-ex performed x 15 reps with supervision    General Comments        Pertinent Vitals/Pain Pain Assessment: No/denies pain    Home Living                      Prior Function            PT Goals (current goals can now be found in the care plan section)  Acute Rehab PT Goals Patient Stated Goal: to walk again PT Goal Formulation: With patient Time For Goal Achievement: 05/09/19 Potential to Achieve Goals: Good Progress towards PT goals: Progressing toward goals    Frequency    Min 2X/week      PT Plan Current plan remains appropriate    Co-evaluation              AM-PAC PT "6 Clicks" Mobility   Outcome Measure  Help needed turning from your back to your side while in a flat bed without using bedrails?: None Help needed moving from lying on your back to sitting on the side of a flat bed without  using bedrails?: None Help needed moving to and from a bed to a chair (including a wheelchair)?: None Help needed standing up from a chair using your arms (e.g., wheelchair or bedside chair)?: None Help needed to walk in hospital room?: None Help needed climbing 3-5 steps with a railing? : A Little 6 Click Score: 23    End of Session Equipment Utilized During Treatment: Gait belt Activity Tolerance: Patient tolerated treatment well Patient left: with bed alarm set;with call bell/phone within reach;in bed Nurse Communication: Mobility status PT Visit Diagnosis: Muscle weakness (generalized) (M62.81);Difficulty in walking, not elsewhere classified (R26.2);History of falling (Z91.81);Unsteadiness on feet (R26.81)     Time: 2122-4825 PT Time Calculation (min) (ACUTE ONLY): 24 min  Charges:  $Gait Training: 8-22 mins $Therapeutic Exercise: 8-22 mins                     George Wade, PT, DPT [phone]    George Wade 04/25/2019, 10:49 AM

## 2019-04-25 NOTE — Plan of Care (Signed)

## 2019-04-26 NOTE — TOC Transition Note (Signed)
Transition of Care [facility]) - CM/SW Discharge Note   Patient Details  Name: George Wade MRN: [id number] Date of Birth: Feb 15, 1956  Transition [facility] Care [facility]) CM/SW Contact:  Latanya Maudlin, RN Phone Number: 04/26/2019, 2:00 PM   Clinical Narrative: [facility] team consulted to assist with disposition as he has no permanent residence or insurance. Patient is seeking subsidized or discounted living somewhere. Patient has been calling several places in the area and has a notepad of all the resources he has been seeking for housing. Patient tells me he is set to meet with someone today who is knowledgeable about housing so he hopes to secure a residence. The patient knows he will likely stay at [facility] while he awaits housing situation.        Barriers to Discharge: Continued Medical Work up   Patient Goals and CMS Choice Patient states their goals for this hospitalization and ongoing recovery are:: To feel better      Discharge Placement                       Discharge Plan and Services In-house Referral: Clinical Social Work Discharge Planning Services: CM Consult                                 Social Determinants of Health (SDOH) Interventions     Readmission Risk Interventions No flowsheet data found.

## 2019-04-26 NOTE — Progress Notes (Signed)
Nutrition Follow-up  DOCUMENTATION CODES:   Not applicable  INTERVENTION:   Nepro Shake po BID, each supplement provides 425 kcal and 19 grams protein  Provide Magic cup TID with meals, each supplement provides 290 kcal and 9 grams of protein.  NUTRITION DIAGNOSIS:   Inadequate oral intake related to acute illness(GIB) as evidenced by NPO status. Resolved.  GOAL:   Patient will meet greater than or equal to 90% of their needs Progressing.  MONITOR:   PO intake, Supplement acceptance, Labs, Weight trends, Skin, I & O's  ASSESSMENT:   63 y.o. 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 Pt found to have aspiration PNA and GIB   Pt continues to have good appetite and oral intake; pt eating 100% of meals in hospital and drinking Nepro supplements. Pt also eating Magic Cups that are being sent on meal trays. Refeed labs stable. No new weight since 6/28; will re-request weekly weights.   Medications reviewed and include: folic acid, MVI, protonix, niacin, Kphos, thiamine, vitamin C  Labs reviewed:   Diet Order:   Diet Order            DIET DYS 2 Room service appropriate? Yes with Assist; Fluid consistency: Nectar Thick  Diet effective now             EDUCATION NEEDS:   No education needs have been identified at this time  Skin:  Skin Assessment: Skin Integrity Issues:(MSAD to groin)  Last BM:  7/12- type 4  Height:   Ht Readings from Last 1 Encounters:  03/31/19 5\' 9"  (1.753 m)   Weight:   Wt Readings from Last 1 Encounters:  04/08/19 83.8 kg   Ideal Body Weight:  72.7 kg  BMI:  Body mass index is 27.28 kg/m.  Estimated Nutritional Needs:   Kcal:   1900-2200kcal/day  Protein:  95-110g/day  Fluid:  >2.1L/day  Koleen Distance MS, RD, LDN Pager #- [phone] Office#- [phone] After Hours Pager: [phone]

## 2019-04-26 NOTE — Progress Notes (Signed)
[place] at [place] NAME: George Wade    MR#:  [id number]  DATE OF BIRTH:  11/02/1955  SUBJECTIVE:   No acute complaints or events overnight.    REVIEW OF SYSTEMS:   Review of Systems  Constitutional: Negative.   HENT: Negative.   Eyes: Negative.   Respiratory: Negative.   Cardiovascular: Negative.   Gastrointestinal: Negative.   Genitourinary: Negative.   Musculoskeletal: Negative.   Skin: Negative.   Neurological: Negative.   Endo/Heme/Allergies: Negative.   Psychiatric/Behavioral: Has some hallucinations on and off All other systems reviewed and are negative. Tolerating Diet:yes Tolerating PT: Yes  DRUG ALLERGIES:  No Known Allergies  VITALS:  Blood pressure 101/72, pulse 87, temperature 98.6 F (37 C), resp. rate 17, height 5\' 9"  (1.753 m), weight 83.8 kg, SpO2 96 %.  PHYSICAL EXAMINATION:   Physical Exam  GENERAL:  63 y.o.-year-old patient lying in the bed in no acute distress.  EYES: Pupils equal, round, reactive to light and accommodation. No scleral icterus. Extraocular muscles intact.  HEENT: Head atraumatic, normocephalic. Oropharynx and nasopharynx clear.  NECK:  Supple, no jugular venous distention. No thyroid enlargement, no tenderness.  LUNGS: Normal breath sounds bilaterally, no wheezing, rales, rhonchi. No use of accessory muscles of respiration.  CARDIOVASCULAR: S1, S2 normal. No murmurs, rubs, or gallops.  ABDOMEN: Soft, nontender, nondistended. Bowel sounds present. No organomegaly or mass.  EXTREMITIES: No cyanosis, clubbing or edema b/l.    NEUROLOGIC: Cranial nerves II through XII are intact. No focal Motor or sensory deficits b/l.   PSYCHIATRIC:  patient is alert but disoriented at times. SKIN: No obvious rash, lesion, or ulcer.   LABORATORY PANEL:  CBC No results for input(s): WBC, HGB, HCT, PLT in the last 168 hours.  Chemistries  No results for input(s): NA, K, CL, CO2, GLUCOSE,  BUN, CREATININE, CALCIUM, MG, AST, ALT, ALKPHOS, BILITOT in the last 168 hours.  Invalid input(s): GFRCGP Cardiac Enzymes No results for input(s): TROPONINI in the last 168 hours. RADIOLOGY:  Dg Chest 2 View  Result Date: 04/24/2019 CLINICAL DATA:  Follow-up pneumonia EXAM: CHEST - 2 VIEW COMPARISON:  04/08/2019 FINDINGS: Cardiac shadow is stable. Aortic calcifications are again seen. Improved aeration is noted bilaterally with some residual infiltrate in the right upper lobe identified. No sizable effusion is seen. No bony abnormality is noted. IMPRESSION: Improved aeration although persistent infiltrate is noted in the right upper lobe. Electronically Signed   By: Inez Catalina M.D.   On: 04/24/2019 13:38   ASSESSMENT AND PLAN:  1. Status post  respiratory failure with hypoxia from multifocal pneumonia-healthcare associated pneumonia improved.  hx of Ongoing aspiration, clinically improved.  Off BiPAP.  Off  high flow oxygen. Will attempt to completely wean off O2 now.  Completed course of vancomycin and Unasyn and Rocephin antibiotics.  2. Staph epidermidis bacteremia resolved.  Echocardiogram negative.  Transesophageal echocardiogram deferred by cardiology.  Patient completed course of IV antibiotics as per infectious disease attending.  3. 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  Status post ENT evaluation.  Status post modified barium swallow study on dysphagia diet with nectar thick liquids and tolerating it well.   Completed course of antibiotics.per speech pt will need repeat MBS.  4. Acute blood loss anemia with GI bleed.  Recent endoscopy with peptic ulcer disease and gastritis.  On Protonix. Hg. Stable and no acute need for transfusion.  5. Hypokalemia and hypophosphatemia resolved with supplementation.   6. Acute rhabdomyolysis -resolved with fluids.   7. History of alcohol use - no acute evidence of  withdrawal.   8. Hypertension on Norvasc.  9. Delusions or hallucinations.  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 - no acute other issues presently.   Patient has a complicated disposition plan as he has no place to live, no insurance, social work is working on placement presently.   Case discussed with Care Management/Social Worker.  CODE STATUS: full  DVT Prophylaxis: SCD/ambulation  TOTAL TIME TAKING CARE OF THIS PATIENT: 25 minutes.   >50% time spent on counselling and coordination of care  POSSIBLE D/C IN *?* DAYS, DEPENDING ON CLINICAL CONDITION.  Note: This dictation was prepared with Dragon dictation along with smaller phrase technology. Any transcriptional errors that result from this process are unintentional.  Henreitta Leber M.D on 04/26/2019 at 1:12 PM  Between 7am to 6pm - Pager - [phone]  After 6pm go to [email or web address] - password [email or web address] [facility]  Office  [phone]  CC: Primary care physician; Patient, [name] PerPatient ID: George Wade, male   DOB: [birth year], 63 y.o.   MRN: [id number]

## 2019-04-27 MED ORDER — FOLIC ACID 1 MG PO TABS: 1.0000 mg | ORAL_TABLET | Freq: Every day | ORAL | 1 refills | Status: DC

## 2019-04-27 MED ORDER — CLONIDINE HCL 0.1 MG PO TABS: 0.1000 mg | ORAL_TABLET | Freq: Two times a day (BID) | ORAL | 11 refills | Status: DC

## 2019-04-27 MED ORDER — NIACIN 500 MG PO TABS: 500.0000 mg | ORAL_TABLET | Freq: Every day | ORAL | 1 refills | Status: DC

## 2019-04-27 MED ORDER — TRAZODONE HCL 50 MG PO TABS: 50.0000 mg | ORAL_TABLET | Freq: Every evening | ORAL | 1 refills | Status: DC | PRN

## 2019-04-27 MED ORDER — THIAMINE HCL 100 MG PO TABS: 100.0000 mg | ORAL_TABLET | Freq: Every day | ORAL | 1 refills | Status: DC

## 2019-04-27 MED ORDER — QUETIAPINE FUMARATE 25 MG PO TABS: 25.0000 mg | ORAL_TABLET | Freq: Two times a day (BID) | ORAL | 1 refills | Status: DC

## 2019-04-27 MED ORDER — OMEPRAZOLE 40 MG PO CPDR: 40.0000 mg | DELAYED_RELEASE_CAPSULE | Freq: Every day | ORAL | 1 refills | Status: DC

## 2019-04-27 NOTE — Progress Notes (Signed)
[place] at [place] NAME: George Wade    MR#:  [id number]  DATE OF BIRTH:  1956/07/13  SUBJECTIVE:   No Acute events overnight, plan for possible discharge tomorrow.    REVIEW OF SYSTEMS:   Review of Systems  Constitutional: Negative.   HENT: Negative.   Eyes: Negative.   Respiratory: Negative.   Cardiovascular: Negative.   Gastrointestinal: Negative.   Genitourinary: Negative.   Musculoskeletal: Negative.   Skin: Negative.   Neurological: Negative.   Endo/Heme/Allergies: Negative.   Psychiatric/Behavioral: Has some hallucinations on and off All other systems reviewed and are negative. Tolerating Diet:yes Tolerating PT: Yes  DRUG ALLERGIES:  No Known Allergies  VITALS:  Blood pressure 111/79, pulse 92, temperature 98.4 F (36.9 C), temperature source Oral, resp. rate 20, height 5\' 9"  (1.753 m), weight 83.8 kg, SpO2 95 %.  PHYSICAL EXAMINATION:   Physical Exam  GENERAL:  63 y.o.-year-old patient lying in the bed in no acute distress.  EYES: Pupils equal, round, reactive to light and accommodation. No scleral icterus. Extraocular muscles intact.  HEENT: Head atraumatic, normocephalic. Oropharynx and nasopharynx clear.  NECK:  Supple, no jugular venous distention. No thyroid enlargement, no tenderness.  LUNGS: Normal breath sounds bilaterally, no wheezing, rales, rhonchi. No use of accessory muscles of respiration.  CARDIOVASCULAR: S1, S2 normal. No murmurs, rubs, or gallops.  ABDOMEN: Soft, nontender, nondistended. Bowel sounds present. No organomegaly or mass.  EXTREMITIES: No cyanosis, clubbing or edema b/l.    NEUROLOGIC: Cranial nerves II through XII are intact. No focal Motor or sensory deficits b/l.   PSYCHIATRIC:  patient is alert but disoriented at times. SKIN: No obvious rash, lesion, or ulcer.   LABORATORY PANEL:  CBC No results for input(s): WBC, HGB, HCT, PLT in the last 168 hours.  Chemistries   No results for input(s): NA, K, CL, CO2, GLUCOSE, BUN, CREATININE, CALCIUM, MG, AST, ALT, ALKPHOS, BILITOT in the last 168 hours.  Invalid input(s): GFRCGP Cardiac Enzymes No results for input(s): TROPONINI in the last 168 hours. RADIOLOGY:  No results found. ASSESSMENT AND PLAN:  1. Status post  respiratory failure with hypoxia from multifocal pneumonia-healthcare associated pneumonia improved.  hx of Ongoing aspiration, clinically improved.  Off BiPAP.  Off  high flow oxygen. Will attempt to completely wean off O2 now.  Completed course of vancomycin and Unasyn and Rocephin antibiotics.  2. Staph epidermidis bacteremia resolved.  Echocardiogram negative.  Transesophageal echocardiogram deferred by cardiology.  Patient completed course of IV antibiotics as per infectious disease attending.  3. 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  Status post ENT evaluation.  Status post modified barium swallow study on dysphagia diet with nectar thick liquids and tolerating it well.   Completed course of antibiotics.per speech pt will need repeat MBS.  4. Acute blood loss anemia with GI bleed.  Recent endoscopy with peptic ulcer disease and gastritis.  On Protonix. Hg. Stable and no acute need for transfusion.   5. Hypokalemia and hypophosphatemia resolved with supplementation.   6. Acute rhabdomyolysis -resolved with fluids.   7. History of alcohol use - no acute evidence of withdrawal.   8. Hypertension on Norvasc.  9. Delusions or hallucinations.  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 - no acute other issues presently.   Plan for discharge tomorrow as patient has a  hotel that he is going to be discharged to and and plan to move into an apartment a week later.  Discussed with social Public affairs consultant.   Case discussed with Care Management/Social Worker.  CODE STATUS: full  DVT Prophylaxis: SCD/ambulation  TOTAL TIME TAKING CARE OF THIS PATIENT: 25 minutes.   >50% time spent on counselling and coordination of care  POSSIBLE D/C tomorrow.   Note: This dictation was prepared with Dragon dictation along with smaller phrase technology. Any transcriptional errors that result from this process are unintentional.  Henreitta Leber M.D on 04/27/2019 at 1:34 PM  Between 7am to 6pm - Pager - [phone]  After 6pm go to [email or web address] - password [email or web address] [facility]  Office  [phone]  CC: Primary care physician; Patient, [name] PerPatient ID: George Wade, male   DOB: 05/06/1956, 63 y.o.   MRN: [id number]

## 2019-04-27 NOTE — Progress Notes (Signed)
Speech therapy note: reviewed chart notes; consulted NSG. Pt has been tolerating the recommended diet (dysphagia level 2 w/ Nectar liquids) s/p MBSS(3rd study) this week. Per the last MBSS, pt's swallow function had improved but laryngeal penetration and aspiration(Silent) were noted w/ liquids; food residue on a previous study.  Suspect: this presentation could be premorbid as pt had Throat Cancer w/ XRT in past - this could be contributing to his delay in pharyngeal swallow initiation as well as in the decreased Laryngeal sensation(Silent nature of aspiration). 2. Also, an EGD in 09/29/2018; there was finding of severe ulcerating Esophagitis of the entire mid-distal Esophagus related to ETOH abuse (per hospital in [place], [place]).  Recommend pt continue w/ this current diet w/ repeat of MBSS/objective study in ~3-4 weeks for ongoing assessment of swallow function and safety for diet upgrade. This was discussed w/ pt and MD, NSG. Recommend continue strict aspiration precautions; oral care; education w/ thickening liquids. Handouts given.      Orinda Kenner, [place], CCC-SLP

## 2019-04-27 NOTE — Progress Notes (Addendum)
Physical Therapy Treatment Patient Details Name: George Wade MRN: [id number] DOB: [birth year] Today's Date: 04/27/2019    History of Present Illness 63 y/o male here with progressive weakness, black/tarry stools and fall/unable to recover (outside in rain x4-5 hours); admitted for management of GIB, aspiration pna, encephalopathy and staph bacteremia. Stay complicated by CCU admission secondary to aspiration pneumonia. and alcohol withdrawl.    PT Comments    Pt is making good progress towards goals. Improved balance during mobility efforts this date. Still recommend use of AD to maintain independence and decrease fall risk. HR increases to 120s with exertion. All mobility performed on RA with no SOB symptoms noted. Plans for discharge next date.   Follow Up Recommendations  Home health PT;Supervision/Assistance - 24 hour     Equipment Recommendations  Rolling walker with 5" wheels    Recommendations for Other Services       Precautions / Restrictions Precautions Precautions: Fall Restrictions Weight Bearing Restrictions: No    Mobility  Bed Mobility Overal bed mobility: Independent Bed Mobility: Supine to Sit;Sit to Supine     Supine to sit: Independent     General bed mobility comments: safe technique. Bed flat. No assist  Transfers Overall transfer level: Independent Equipment used: None Transfers: Sit to/from Stand Sit to Stand: Independent         General transfer comment: safe technique. No assist required  Ambulation/Gait Ambulation/Gait assistance: Min guard Gait Distance (Feet): 325 Feet Assistive device: None Gait Pattern/deviations: Step-through pattern     General Gait Details: ambulated using no AD. Pt with upright posture. No SOB symptoms. All mobility performed on RA with sats at 95% pre and 93% post. HR at 120 bpm with exertion.   Stairs             Wheelchair Mobility    Modified Rankin (Stroke Patients Only)       Balance  Overall balance assessment: Needs assistance Sitting-balance support: Feet supported Sitting balance-Leahy Scale: Good     Standing balance support: No upper extremity supported Standing balance-Leahy Scale: Good                              Cognition Arousal/Alertness: Awake/alert Behavior During Therapy: WFL for tasks assessed/performed Overall Cognitive Status: Within Functional Limits for tasks assessed                                        Exercises Other Exercises Other Exercises: supine ther-ex performed including B LE SLRs, ankle circles, hip abd/add, and resisted heel slides. 15 reps with supervision    General Comments        Pertinent Vitals/Pain Pain Assessment: No/denies pain    Home Living                      Prior Function            PT Goals (current goals can now be found in the care plan section) Acute Rehab PT Goals Patient Stated Goal: to leave the hospital PT Goal Formulation: With patient Time For Goal Achievement: 05/09/19 Potential to Achieve Goals: Good Progress towards PT goals: Progressing toward goals    Frequency    Min 2X/week      PT Plan Current plan remains appropriate    Co-evaluation  AM-PAC PT "6 Clicks" Mobility   Outcome Measure  Help needed turning from your back to your side while in a flat bed without using bedrails?: None Help needed moving from lying on your back to sitting on the side of a flat bed without using bedrails?: None Help needed moving to and from a bed to a chair (including a wheelchair)?: None Help needed standing up from a chair using your arms (e.g., wheelchair or bedside chair)?: None Help needed to walk in hospital room?: None Help needed climbing 3-5 steps with a railing? : A Little 6 Click Score: 23    End of Session Equipment Utilized During Treatment: Gait belt Activity Tolerance: Patient tolerated treatment well Patient left: in  bed;with bed alarm set Nurse Communication: Mobility status PT Visit Diagnosis: Muscle weakness (generalized) (M62.81);Difficulty in walking, not elsewhere classified (R26.2);History of falling (Z91.81);Unsteadiness on feet (R26.81)     Time: 7680-8811 PT Time Calculation (min) (ACUTE ONLY): 24 min  Charges:  $Gait Training: 8-22 mins $Therapeutic Exercise: 8-22 mins                     Greggory Stallion, PT, DPT [phone]    Elanie Hammitt 04/27/2019, 3:28 PM

## 2019-04-28 LAB — PHOSPHORUS: Phosphorus: 5 mg/dL — ABNORMAL HIGH (ref 2.5–4.6)

## 2019-04-28 LAB — POTASSIUM: Potassium: 3.8 mmol/L (ref 3.5–5.1)

## 2019-04-28 NOTE — TOC Transition Note (Signed)
Transition of Care [facility]) - CM/SW Discharge Note   Patient Details  Name: George Wade MRN: [id number] Date of Birth: 1955/12/13  [facility] CM/SW Contact:  Latanya Maudlin, RN Phone Number: 04/28/2019, 3:26 PM   Clinical Narrative: Patient is stable for discharge. Patient has worked with [employer] person he know to assist him with housing. He plans to go to [facility] for 1 week and then he will be able to get into his discounted apartment sometime next week. Patient is being recommended home health but he states he has done everything independently and walker over 300 feet. Patient would like to hold off on PT since he feels he progressed. Patient understands he may seek home health once his out of state insurance is solved and he gets a PCP. Patients friend to provide transport to hotel. TOC team left voicemail to cardinal and APS to update them.          Barriers to Discharge: Continued Medical Work up   Patient Goals and CMS Choice Patient states their goals for this hospitalization and ongoing recovery are:: To feel better      Discharge Placement                       Discharge Plan and Services In-house Referral: Clinical Social Work Discharge Planning Services: CM Consult, Medication Assistance, Homebound not met per provider                                 Social Determinants of Health (SDOH) Interventions     Readmission Risk Interventions No flowsheet data found.

## 2019-04-28 NOTE — Discharge Summary (Addendum)
[place] at [place] NAME: George Wade    MR#:  [id number]  DATE OF BIRTH:  1956-09-06  DATE OF ADMISSION:  03/31/2019 ADMITTING PHYSICIAN: Christel Mormon, MD  DATE OF DISCHARGE: 04/28/2019  PRIMARY CARE PHYSICIAN: Patient, [name] Per    ADMISSION DIAGNOSIS:  Dehydration [E86.0] Dyspnea [R06.00] Fall [W19.XXXA] Alcohol withdrawal syndrome with complication ([place]) [F79.024] Traumatic rhabdomyolysis, initial encounter ([place]) [T79.6XXA] Gastrointestinal hemorrhage, unspecified gastrointestinal hemorrhage type [K92.2]  DISCHARGE DIAGNOSIS:   Staph epidermidis bacteremia Acute blood loss anemia with GI bleed--peptic ulcer  Aspiration pneumonia H/o alcohol abuse SECONDARY DIAGNOSIS:   Past Medical History:  Diagnosis Date  . ED (erectile dysfunction)   . Hypercholesteremia   . Hypertension   . Lung cancer [facility])     HOSPITAL COURSE:   1.Status postrespiratory failure with hypoxia from multifocal pneumonia-healthcare associated pneumonia improved.  -Will attempt to completely wean off O2 now.   -Completed course of vancomycin and Unasyn and Rocephin antibiotics.  2. Staph epidermidis bacteremiaresolved.  -Echocardiogram negative. Transesophageal echocardiogram deferred by cardiology.Patient completed course of IV antibiotics as per infectious disease attending.  3. 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 Completed course of antibiotics.per speech pt will need repeat MBS.  4. Acute blood loss anemia with GI bleed. - Recent endoscopy with peptic ulcer disease and gastritis.  -On Protonix. Hg. Stable and no acute need for transfusion.   5. Hypokalemia and hypophosphatemiaresolved with  supplementation.   6. Acute rhabdomyolysis -resolved with fluids.   7. History of alcohol use - no acute evidence of withdrawal.   8. Hypertension on Norvasc.  9. Delusions / hallucinations.Could be from alcohol related organic brain syndrome?dementia/ wernicke's encephalopathy - psychiatry consult done--recommends to cont seroquel. - no acute other issues presently.   Plan for discharge today as patient has a hotel that he is going to be discharged to and and plan to move into an apartment a week later.  Discussed with social Financial controller. D/c today  CONSULTS OBTAINED:  Treatment Team:  Beverly Gust, MD  DRUG ALLERGIES:  No Known Allergies  DISCHARGE MEDICATIONS:   Allergies as of 04/28/2019   No Known Allergies     Medication List    STOP taking these medications   amLODipine 10 MG tablet Commonly known as: NORVASC   atorvastatin 40 MG tablet Commonly known as: LIPITOR   benazepril 40 MG tablet Commonly known as: LOTENSIN   tadalafil 5 MG tablet Commonly known as: CIALIS     TAKE these medications   cloNIDine 0.1 MG tablet Commonly known as: CATAPRES Take 1 tablet (0.1 mg total) by mouth 2 (two) times daily.   folic acid 1 MG tablet Commonly known as: FOLVITE Take 1 tablet (1 mg total) by mouth daily.   multivitamin with minerals Tabs tablet Take 1 tablet by mouth daily.   niacin 500 MG tablet Take 1 tablet (500 mg total) by mouth at bedtime.   omeprazole 40 MG capsule Commonly known as: PRILOSEC Take 1 capsule (40 mg total) by mouth daily.   QUEtiapine 25 MG tablet Commonly known as: SEROQUEL Take 1 tablet (25 mg total) by mouth 2 (two) times daily.   thiamine 100 MG tablet Take 1 tablet (100 mg total) by mouth daily.   traZODone 50 MG  tablet Commonly known as: DESYREL Take 1 tablet (50 mg total) by mouth at bedtime as needed for sleep.       If you experience worsening of your admission symptoms, develop shortness of  breath, life threatening emergency, suicidal or homicidal thoughts you must seek medical attention immediately by calling 911 or calling your MD immediately  if symptoms less severe.  You Must read complete instructions/literature along with all the possible adverse reactions/side effects for all the Medicines you take and that have been prescribed to you. Take any new Medicines after you have completely understood and accept all the possible adverse reactions/side effects.   Please note  You were cared for by a hospitalist during your hospital stay. If you have any questions about your discharge medications or the care you received while you were in the hospital after you are discharged, you can call the unit and asked to speak with the hospitalist on call if the hospitalist that took care of you is not available. Once you are discharged, your primary care physician will handle any further medical issues. Please note that NO REFILLS for any discharge medications will be authorized once you are discharged, as it is imperative that you return to your primary care physician (or establish a relationship with a primary care physician if you do not have one) for your aftercare needs so that they can reassess your need for medications and monitor your lab values. Today   SUBJECTIVE   Doing well. Appreciating the care received here  VITAL SIGNS:  Blood pressure 105/74, pulse 80, temperature 98.2 F (36.8 C), temperature source Oral, resp. rate 19, height 5\' 9"  (1.753 m), weight 83.8 kg, SpO2 94 %.  I/O:  No intake or output data in the 24 hours ending 04/28/19 1404  PHYSICAL EXAMINATION:  GENERAL:  63 y.o.-year-old patient lying in the bed with no acute distress.  EYES: Pupils equal, round, reactive to light and accommodation. No scleral icterus. Extraocular muscles intact.  HEENT: Head atraumatic, normocephalic. Oropharynx and nasopharynx clear.  NECK:  Supple, no jugular venous distention. No  thyroid enlargement, no tenderness.  LUNGS: Normal breath sounds bilaterally, no wheezing, rales,rhonchi or crepitation. No use of accessory muscles of respiration.  CARDIOVASCULAR: S1, S2 normal. No murmurs, rubs, or gallops.  ABDOMEN: Soft, non-tender, non-distended. Bowel sounds present. No organomegaly or mass.  EXTREMITIES: No pedal edema, cyanosis, or clubbing.  NEUROLOGIC: Cranial nerves II through XII are intact. Muscle strength 5/5 in all extremities. Sensation intact. Gait not checked.  PSYCHIATRIC: The patient is alert and oriented x 2.  SKIN: No obvious rash, lesion, or ulcer.   DATA REVIEW:   CBC  No results for input(s): WBC, HGB, HCT, PLT in the last 168 hours.  Chemistries  Recent Labs  Lab 04/28/19 0423  K 3.8    Microbiology Results   No results found for this or any previous visit (from the past 240 hour(s)).  RADIOLOGY:  No results found.   CODE STATUS:     Code Status Orders  (From admission, onward)         Start     Ordered   03/31/19 2341  Full code  Continuous     03/31/19 2340        Code Status History    Date Active Date Inactive Code Status Order ID Comments User Context   12/09/2017 1336 12/12/2017 1845 Full Code [id number]  Dustin Flock, MD Inpatient   Advance Care Planning Activity  TOTAL TIME TAKING CARE OF THIS PATIENT: *40* minutes.    Fritzi Mandes M.D on 04/28/2019 at 2:04 PM  Between 7am to 6pm - Pager - [phone] After 6pm go to [email or web address] - password [email or web address] [facility]  Office  [phone]  CC: Primary care physician; Patient, [name] Per

## 2019-05-15 ENCOUNTER — Inpatient Hospital Stay
Admission: EM | Admit: 2019-05-15 | Discharge: 2019-05-19 | DRG: 871 | Disposition: A | Discharge status: Home / Self Care | Payer: Self-pay | Attending: Specialist | Admitting: Specialist

## 2019-05-15 ENCOUNTER — Other Ambulatory Visit: Payer: Self-pay

## 2019-05-15 ENCOUNTER — Emergency Department: Payer: Self-pay

## 2019-05-15 ENCOUNTER — Encounter: Payer: Self-pay | Admitting: Emergency Medicine

## 2019-05-15 DIAGNOSIS — Z96653 Presence of artificial knee joint, bilateral: Secondary | ICD-10-CM | POA: Diagnosis present

## 2019-05-15 DIAGNOSIS — F17200 Nicotine dependence, unspecified, uncomplicated: Secondary | ICD-10-CM | POA: Diagnosis present

## 2019-05-15 DIAGNOSIS — Z23 Encounter for immunization: Secondary | ICD-10-CM

## 2019-05-15 DIAGNOSIS — J181 Lobar pneumonia, unspecified organism: Secondary | ICD-10-CM

## 2019-05-15 DIAGNOSIS — J690 Pneumonitis due to inhalation of food and vomit: Secondary | ICD-10-CM | POA: Diagnosis present

## 2019-05-15 DIAGNOSIS — Z20828 Contact with and (suspected) exposure to other viral communicable diseases: Secondary | ICD-10-CM | POA: Diagnosis present

## 2019-05-15 DIAGNOSIS — E7800 Pure hypercholesterolemia, unspecified: Secondary | ICD-10-CM | POA: Diagnosis present

## 2019-05-15 DIAGNOSIS — Z85818 Personal history of malignant neoplasm of other sites of lip, oral cavity, and pharynx: Secondary | ICD-10-CM

## 2019-05-15 DIAGNOSIS — R0902 Hypoxemia: Secondary | ICD-10-CM

## 2019-05-15 DIAGNOSIS — Z85118 Personal history of other malignant neoplasm of bronchus and lung: Secondary | ICD-10-CM

## 2019-05-15 DIAGNOSIS — I10 Essential (primary) hypertension: Secondary | ICD-10-CM | POA: Diagnosis present

## 2019-05-15 DIAGNOSIS — Z8249 Family history of ischemic heart disease and other diseases of the circulatory system: Secondary | ICD-10-CM

## 2019-05-15 DIAGNOSIS — R609 Edema, unspecified: Secondary | ICD-10-CM

## 2019-05-15 DIAGNOSIS — J189 Pneumonia, unspecified organism: Secondary | ICD-10-CM

## 2019-05-15 DIAGNOSIS — D649 Anemia, unspecified: Secondary | ICD-10-CM | POA: Diagnosis present

## 2019-05-15 DIAGNOSIS — K449 Diaphragmatic hernia without obstruction or gangrene: Secondary | ICD-10-CM | POA: Diagnosis present

## 2019-05-15 DIAGNOSIS — K2210 Ulcer of esophagus without bleeding: Secondary | ICD-10-CM | POA: Diagnosis present

## 2019-05-15 DIAGNOSIS — R1314 Dysphagia, pharyngoesophageal phase: Secondary | ICD-10-CM | POA: Diagnosis present

## 2019-05-15 DIAGNOSIS — A419 Sepsis, unspecified organism: Principal | ICD-10-CM

## 2019-05-15 DIAGNOSIS — E872 Acidosis: Secondary | ICD-10-CM | POA: Diagnosis present

## 2019-05-15 DIAGNOSIS — F1021 Alcohol dependence, in remission: Secondary | ICD-10-CM | POA: Diagnosis present

## 2019-05-15 DIAGNOSIS — N529 Male erectile dysfunction, unspecified: Secondary | ICD-10-CM | POA: Diagnosis present

## 2019-05-15 DIAGNOSIS — Z882 Allergy status to sulfonamides status: Secondary | ICD-10-CM

## 2019-05-15 DIAGNOSIS — R000 Tachycardia, unspecified: Secondary | ICD-10-CM | POA: Diagnosis present

## 2019-05-15 DIAGNOSIS — R6520 Severe sepsis without septic shock: Secondary | ICD-10-CM | POA: Diagnosis present

## 2019-05-15 DIAGNOSIS — K210 Gastro-esophageal reflux disease with esophagitis: Secondary | ICD-10-CM | POA: Diagnosis present

## 2019-05-15 DIAGNOSIS — E669 Obesity, unspecified: Secondary | ICD-10-CM | POA: Diagnosis present

## 2019-05-15 DIAGNOSIS — R197 Diarrhea, unspecified: Secondary | ICD-10-CM | POA: Diagnosis present

## 2019-05-15 DIAGNOSIS — R1013 Epigastric pain: Secondary | ICD-10-CM

## 2019-05-15 DIAGNOSIS — R591 Generalized enlarged lymph nodes: Secondary | ICD-10-CM | POA: Diagnosis present

## 2019-05-15 DIAGNOSIS — R0602 Shortness of breath: Secondary | ICD-10-CM

## 2019-05-15 DIAGNOSIS — J9601 Acute respiratory failure with hypoxia: Secondary | ICD-10-CM | POA: Diagnosis present

## 2019-05-15 DIAGNOSIS — Z9981 Dependence on supplemental oxygen: Secondary | ICD-10-CM

## 2019-05-15 DIAGNOSIS — Z590 Homelessness: Secondary | ICD-10-CM

## 2019-05-15 DIAGNOSIS — K2920 Alcoholic gastritis without bleeding: Secondary | ICD-10-CM | POA: Diagnosis present

## 2019-05-15 DIAGNOSIS — Z79899 Other long term (current) drug therapy: Secondary | ICD-10-CM

## 2019-05-15 LAB — LIPASE, BLOOD: Lipase: 39 U/L (ref 11–51)

## 2019-05-15 LAB — URINALYSIS, ROUTINE W REFLEX MICROSCOPIC
Bilirubin Urine: NEGATIVE
Glucose, UA: NEGATIVE mg/dL
Hgb urine dipstick: NEGATIVE
Ketones, ur: NEGATIVE mg/dL
Leukocytes,Ua: NEGATIVE
Nitrite: NEGATIVE
Protein, ur: NEGATIVE mg/dL
Specific Gravity, Urine: 1.012 (ref 1.005–1.030)
pH: 8 (ref 5.0–8.0)

## 2019-05-15 LAB — COMPREHENSIVE METABOLIC PANEL
ALT: 31 U/L (ref 0–44)
AST: 33 U/L (ref 15–41)
Albumin: 3 g/dL — ABNORMAL LOW (ref 3.5–5.0)
Alkaline Phosphatase: 157 U/L — ABNORMAL HIGH (ref 38–126)
Anion gap: 16 — ABNORMAL HIGH (ref 5–15)
BUN: 6 mg/dL — ABNORMAL LOW (ref 8–23)
CO2: 27 mmol/L (ref 22–32)
Calcium: 9.7 mg/dL (ref 8.9–10.3)
Chloride: 93 mmol/L — ABNORMAL LOW (ref 98–111)
Creatinine, Ser: 0.47 mg/dL — ABNORMAL LOW (ref 0.61–1.24)
GFR calc Af Amer: >60 mL/min (ref >60)
GFR calc non Af Amer: >60 mL/min (ref >60)
Glucose, Bld: 112 mg/dL — ABNORMAL HIGH (ref 70–99)
Potassium: 3.8 mmol/L (ref 3.5–5.1)
Sodium: 136 mmol/L (ref 135–145)
Total Bilirubin: 0.5 mg/dL (ref 0.3–1.2)
Total Protein: 6.6 g/dL (ref 6.5–8.1)

## 2019-05-15 LAB — CBC WITH DIFFERENTIAL/PLATELET
Abs Immature Granulocytes: 0.03 10*3/uL (ref 0.00–0.07)
Basophils Absolute: 0.1 10*3/uL (ref 0.0–0.1)
Basophils Relative: 1 %
Eosinophils Absolute: 0.1 10*3/uL (ref 0.0–0.5)
Eosinophils Relative: 1 %
HCT: 31.3 % — ABNORMAL LOW (ref 39.0–52.0)
Hemoglobin: 9.1 g/dL — ABNORMAL LOW (ref 13.0–17.0)
Immature Granulocytes: 0 %
Lymphocytes Relative: 33 %
Lymphs Abs: 3.5 10*3/uL (ref 0.7–4.0)
MCH: 23.3 pg — ABNORMAL LOW (ref 26.0–34.0)
MCHC: 29.1 g/dL — ABNORMAL LOW (ref 30.0–36.0)
MCV: 80.3 fL (ref 80.0–100.0)
Monocytes Absolute: 0.9 10*3/uL (ref 0.1–1.0)
Monocytes Relative: 9 %
Neutro Abs: 6 10*3/uL (ref 1.7–7.7)
Neutrophils Relative %: 56 %
Platelets: 368 10*3/uL (ref 150–400)
RBC: 3.9 MIL/uL — ABNORMAL LOW (ref 4.22–5.81)
RDW: 20.3 % — ABNORMAL HIGH (ref 11.5–15.5)
WBC: 10.5 10*3/uL (ref 4.0–10.5)
nRBC: 0 % (ref 0.0–0.2)

## 2019-05-15 LAB — BRAIN NATRIURETIC PEPTIDE: B Natriuretic Peptide: 86 pg/mL (ref 0.0–100.0)

## 2019-05-15 LAB — PROCALCITONIN: Procalcitonin: 0.18 ng/mL

## 2019-05-15 LAB — SARS CORONAVIRUS 2 BY RT PCR (HOSPITAL ORDER, PERFORMED IN ~~LOC~~ HOSPITAL LAB): SARS Coronavirus 2: NEGATIVE

## 2019-05-15 LAB — LACTIC ACID, PLASMA
Lactic Acid, Venous: 4.4 mmol/L (ref 0.5–1.9)
Lactic Acid, Venous: 4.5 mmol/L (ref 0.5–1.9)
Lactic Acid, Venous: 1.7 mmol/L (ref 0.5–1.9)

## 2019-05-15 LAB — TROPONIN I (HIGH SENSITIVITY)
Troponin I (High Sensitivity): 7 ng/L (ref <18)
Troponin I (High Sensitivity): 7 ng/L (ref <18)

## 2019-05-15 LAB — GLUCOSE, CAPILLARY: Glucose-Capillary: 164 mg/dL — ABNORMAL HIGH (ref 70–99)

## 2019-05-15 LAB — MRSA PCR SCREENING: MRSA by PCR: NEGATIVE

## 2019-05-15 LAB — STREP PNEUMONIAE URINARY ANTIGEN: Strep Pneumo Urinary Antigen: NEGATIVE

## 2019-05-15 MED ORDER — QUETIAPINE FUMARATE 25 MG PO TABS
25.0000 mg | ORAL_TABLET | Freq: Two times a day (BID) | ORAL | Status: DC
  Administered 2019-05-15 – 2019-05-19 (×7): 25 mg via ORAL
  Filled 2019-05-15 (×8): qty 1

## 2019-05-15 MED ORDER — CLONIDINE HCL 0.1 MG PO TABS
0.1000 mg | ORAL_TABLET | Freq: Two times a day (BID) | ORAL | Status: DC
  Administered 2019-05-15 – 2019-05-19 (×8): 0.1 mg via ORAL
  Filled 2019-05-15 (×8): qty 1

## 2019-05-15 MED ORDER — PANTOPRAZOLE SODIUM 40 MG PO TBEC
40.0000 mg | DELAYED_RELEASE_TABLET | Freq: Every day | ORAL | Status: DC
  Administered 2019-05-16 – 2019-05-17 (×2): 40 mg via ORAL
  Filled 2019-05-15 (×3): qty 1

## 2019-05-15 MED ORDER — FOLIC ACID 1 MG PO TABS
1.0000 mg | ORAL_TABLET | Freq: Every day | ORAL | Status: DC
  Administered 2019-05-16 – 2019-05-19 (×3): 1 mg via ORAL
  Filled 2019-05-15 (×4): qty 1

## 2019-05-15 MED ORDER — VANCOMYCIN HCL IN DEXTROSE 1-5 GM/200ML-% IV SOLN
1000.0000 mg | Freq: Once | INTRAVENOUS | Status: AC
  Administered 2019-05-15: 1000 mg via INTRAVENOUS
  Filled 2019-05-15: qty 200

## 2019-05-15 MED ORDER — IOHEXOL 300 MG/ML  SOLN
100.0000 mL | Freq: Once | INTRAMUSCULAR | Status: AC | PRN
  Administered 2019-05-15: 100 mL via INTRAVENOUS

## 2019-05-15 MED ORDER — LACTATED RINGERS IV SOLN
INTRAVENOUS | Status: DC
  Administered 2019-05-15 – 2019-05-16 (×2): via INTRAVENOUS

## 2019-05-15 MED ORDER — SODIUM CHLORIDE 0.9 % IV SOLN
2.0000 g | Freq: Once | INTRAVENOUS | Status: AC
  Administered 2019-05-15: 2 g via INTRAVENOUS
  Filled 2019-05-15: qty 2

## 2019-05-15 MED ORDER — VITAMIN B-1 100 MG PO TABS
100.0000 mg | ORAL_TABLET | Freq: Every day | ORAL | Status: DC
  Administered 2019-05-16 – 2019-05-19 (×3): 100 mg via ORAL
  Filled 2019-05-15 (×4): qty 1

## 2019-05-15 MED ORDER — METRONIDAZOLE IN NACL 5-0.79 MG/ML-% IV SOLN
500.0000 mg | Freq: Once | INTRAVENOUS | Status: AC
  Administered 2019-05-15: 500 mg via INTRAVENOUS
  Filled 2019-05-15: qty 100

## 2019-05-15 MED ORDER — SODIUM CHLORIDE 0.9 % IV SOLN
2.0000 g | Freq: Three times a day (TID) | INTRAVENOUS | Status: DC
  Administered 2019-05-15 – 2019-05-19 (×11): 2 g via INTRAVENOUS
  Filled 2019-05-15 (×16): qty 2

## 2019-05-15 MED ORDER — LORAZEPAM 2 MG/ML IJ SOLN: 1.0000 mg | Freq: Four times a day (QID) | INTRAMUSCULAR | Status: AC | PRN

## 2019-05-15 MED ORDER — IOHEXOL 240 MG/ML SOLN
25.0000 mL | INTRAMUSCULAR | Status: AC
  Administered 2019-05-15: 25 mL via ORAL

## 2019-05-15 MED ORDER — SODIUM CHLORIDE 0.9 % IV BOLUS
1000.0000 mL | Freq: Once | INTRAVENOUS | Status: AC
  Administered 2019-05-15: 1000 mL via INTRAVENOUS

## 2019-05-15 MED ORDER — ENOXAPARIN SODIUM 40 MG/0.4ML ~~LOC~~ SOLN
40.0000 mg | SUBCUTANEOUS | Status: DC
  Administered 2019-05-15 – 2019-05-17 (×3): 40 mg via SUBCUTANEOUS
  Filled 2019-05-15 (×4): qty 0.4

## 2019-05-15 MED ORDER — CHLORHEXIDINE GLUCONATE CLOTH 2 % EX PADS: 6.0000 | MEDICATED_PAD | Freq: Every day | CUTANEOUS | Status: DC

## 2019-05-15 MED ORDER — ADULT MULTIVITAMIN W/MINERALS CH
1.0000 | ORAL_TABLET | Freq: Every day | ORAL | Status: DC
  Administered 2019-05-16 – 2019-05-19 (×3): 1 via ORAL
  Filled 2019-05-15 (×4): qty 1

## 2019-05-15 MED ORDER — TRAZODONE HCL 50 MG PO TABS
50.0000 mg | ORAL_TABLET | Freq: Every evening | ORAL | Status: DC | PRN
  Administered 2019-05-15 – 2019-05-17 (×2): 50 mg via ORAL
  Filled 2019-05-15 (×2): qty 1

## 2019-05-15 MED ORDER — NIACIN ER 500 MG PO TBCR
500.0000 mg | EXTENDED_RELEASE_TABLET | Freq: Every day | ORAL | Status: DC
  Administered 2019-05-15 – 2019-05-17 (×3): 500 mg via ORAL
  Filled 2019-05-15 (×6): qty 1

## 2019-05-15 MED ORDER — ONDANSETRON HCL 4 MG/2ML IJ SOLN
4.0000 mg | Freq: Once | INTRAMUSCULAR | Status: AC
  Administered 2019-05-15: 4 mg via INTRAVENOUS
  Filled 2019-05-15: qty 2

## 2019-05-15 MED ORDER — MORPHINE SULFATE (PF) 4 MG/ML IV SOLN
4.0000 mg | Freq: Once | INTRAVENOUS | Status: AC
  Administered 2019-05-15: 16:00:00 4 mg via INTRAVENOUS
  Filled 2019-05-15: qty 1

## 2019-05-15 MED ORDER — PROMETHAZINE HCL 25 MG/ML IJ SOLN
12.5000 mg | Freq: Four times a day (QID) | INTRAMUSCULAR | Status: DC | PRN
  Administered 2019-05-16: 12.5 mg via INTRAVENOUS
  Filled 2019-05-15: qty 1

## 2019-05-15 MED ORDER — ONDANSETRON HCL 4 MG/2ML IJ SOLN
4.0000 mg | Freq: Four times a day (QID) | INTRAMUSCULAR | Status: DC | PRN
  Administered 2019-05-15 – 2019-05-17 (×4): 4 mg via INTRAVENOUS
  Filled 2019-05-15 (×4): qty 2

## 2019-05-15 MED ORDER — LORAZEPAM 1 MG PO TABS: 1.0000 mg | ORAL_TABLET | Freq: Four times a day (QID) | ORAL | Status: AC | PRN

## 2019-05-15 MED ORDER — IPRATROPIUM-ALBUTEROL 0.5-2.5 (3) MG/3ML IN SOLN
3.0000 mL | RESPIRATORY_TRACT | Status: DC | PRN
  Administered 2019-05-15: 3 mL via RESPIRATORY_TRACT
  Filled 2019-05-15: qty 3

## 2019-05-15 MED ORDER — MORPHINE SULFATE (PF) 4 MG/ML IV SOLN
4.0000 mg | Freq: Once | INTRAVENOUS | Status: AC
  Administered 2019-05-15: 4 mg via INTRAVENOUS
  Filled 2019-05-15: qty 1

## 2019-05-15 MED ORDER — VANCOMYCIN HCL IN DEXTROSE 1-5 GM/200ML-% IV SOLN
1000.0000 mg | Freq: Once | INTRAVENOUS | Status: AC
  Administered 2019-05-15: 18:00:00 1000 mg via INTRAVENOUS
  Filled 2019-05-15: qty 200

## 2019-05-15 MED ORDER — LACTATED RINGERS IV BOLUS (SEPSIS)
1000.0000 mL | Freq: Once | INTRAVENOUS | Status: AC
  Administered 2019-05-15: 1000 mL via INTRAVENOUS

## 2019-05-15 NOTE — ED Provider Notes (Signed)
[facility] Emergency Department Provider Note   ____________________________________________   First MD Initiated Contact with Patient 05/15/19 1315     (approximate)  I have reviewed the triage vital signs and the nursing notes.   HISTORY  Chief Complaint Shortness of Breath    HPI George Wade is a 63 y.o. male with history of hypertension and alcohol abuse presents to the ED complaining of shortness of breath and vomiting.  Patient reports that he has been feeling very short of breath for greater than the past week and gets out of breath even moving short distances.  Over the past 5 days, he is also had difficulty keeping anything down, reports multiple episodes of nonbilious and nonbloody vomiting as well as nonbloody diarrhea.  He has begun to develop upper abdominal pain after onset of vomiting.  He describes a dry cough, has felt hot but not taken his temperature.  He denies any chest pain.  He is not aware of any sick contacts and has not had any recent travel outside the area.  He denies any pain or swelling in his lower extremities.        Past Medical History:  Diagnosis Date  . ED (erectile dysfunction)   . Hypercholesteremia   . Hypertension   . Lung cancer [facility])     Patient Active Problem List   Diagnosis Date Noted  . Wernicke's encephalopathy 04/15/2019  . GI bleed 03/31/2019  . Alcohol withdrawal ([place]) 12/09/2017    Past Surgical History:  Procedure Laterality Date  . HERNIA REPAIR  1958  . REPLACEMENT TOTAL KNEE BILATERAL  2009  . TONSILLECTOMY  2008    Prior to Admission medications   Medication Sig Start Date End Date Taking? Authorizing Provider  cloNIDine (CATAPRES) 0.1 MG tablet Take 1 tablet (0.1 mg total) by mouth 2 (two) times daily. 04/27/19   Henreitta Leber, MD  folic acid (FOLVITE) 1 MG tablet Take 1 tablet (1 mg total) by mouth daily. 04/27/19 06/26/19  Henreitta Leber, MD  Multiple Vitamin (MULTIVITAMIN WITH  MINERALS) TABS tablet Take 1 tablet by mouth daily. Patient not taking: Reported on 03/31/2019 12/12/17   Salary, Holly Bodily D, MD  niacin 500 MG tablet Take 1 tablet (500 mg total) by mouth at bedtime. 04/27/19 06/26/19  Henreitta Leber, MD  omeprazole (PRILOSEC) 40 MG capsule Take 1 capsule (40 mg total) by mouth daily. 04/27/19 06/26/19  Henreitta Leber, MD  QUEtiapine (SEROQUEL) 25 MG tablet Take 1 tablet (25 mg total) by mouth 2 (two) times daily. 04/27/19 06/26/19  Henreitta Leber, MD  thiamine 100 MG tablet Take 1 tablet (100 mg total) by mouth daily. 04/27/19 06/26/19  Henreitta Leber, MD  traZODone (DESYREL) 50 MG tablet Take 1 tablet (50 mg total) by mouth at bedtime as needed for sleep. 04/27/19 06/26/19  Henreitta Leber, MD    Allergies Patient has no known allergies.  Family History  Problem Relation Age of Onset  . Cancer Mother   . Hypertension Father   . Cancer Paternal Grandmother     Social History Social History   Tobacco Use  . Smoking status: Never Smoker  . Smokeless tobacco: Never Used  Substance Use Topics  . Alcohol use: No    Frequency: Never    Comment: Quit 2 days ago  . Drug use: No    Review of Systems  Constitutional: Positive for subjective fever Eyes: No visual changes. ENT: No sore throat. Cardiovascular:  Denies chest pain. Respiratory: Positive for shortness of breath and cough Gastrointestinal: Positive for abdominal pain, nausea, vomiting, and diarrhea.  No constipation. Genitourinary: Negative for dysuria. Musculoskeletal: Negative for back pain. Skin: Negative for rash. Neurological: Negative for headaches, focal weakness or numbness.  ____________________________________________   PHYSICAL EXAM:  VITAL SIGNS: ED Triage Vitals  Enc Vitals Group     BP 05/15/19 1315 (!) 120/109     Pulse Rate 05/15/19 1315 (!) 142     Resp 05/15/19 1315 (!) 32     Temp 05/15/19 1315 97.6 F (36.4 C)     Temp Source 05/15/19 1315 Skin     SpO2  05/15/19 1315 (!) 86 %     Weight 05/15/19 1308 184 lb 11.9 oz (83.8 kg)     Height --      Head Circumference --      Peak Flow --      Pain Score 05/15/19 1308 0     Pain Loc --      Pain Edu? --      Excl. in [place]? --     Constitutional: Alert and oriented. Eyes: Conjunctivae are normal. Head: Atraumatic. Nose: No congestion/rhinnorhea. Mouth/Throat: Mucous membranes are moist. Neck: Normal ROM Cardiovascular: Normal rate, regular rhythm. Grossly normal heart sounds. Respiratory: Tachypneic with increased work of breathing, diffuse crackles and wheezing.  Speaks in single words. Gastrointestinal: Soft and nontender. No distention. Genitourinary: deferred Musculoskeletal: Edema to right foot chronic per patient, no lower extremity tenderness Neurologic:  Normal speech and language. No gross focal neurologic deficits are appreciated. Skin:  Skin is warm, dry and intact. No rash noted. Psychiatric: Mood and affect are normal. Speech and behavior are normal.  ____________________________________________   LABS (all labs ordered are listed, but only abnormal results are displayed)  Labs Reviewed  LACTIC ACID, PLASMA - Abnormal; Notable for the following components:      Result Value   Lactic Acid, Venous 4.4 (*)    All other components within normal limits  COMPREHENSIVE METABOLIC PANEL - Abnormal; Notable for the following components:   Chloride 93 (*)    Glucose, Bld 112 (*)    BUN 6 (*)    Creatinine, Ser 0.47 (*)    Albumin 3.0 (*)    Alkaline Phosphatase 157 (*)    Anion gap 16 (*)    All other components within normal limits  CBC WITH DIFFERENTIAL/PLATELET - Abnormal; Notable for the following components:   RBC 3.90 (*)    Hemoglobin 9.1 (*)    HCT 31.3 (*)    MCH 23.3 (*)    MCHC 29.1 (*)    RDW 20.3 (*)    All other components within normal limits  URINALYSIS, ROUTINE W REFLEX MICROSCOPIC - Abnormal; Notable for the following components:   Color, Urine YELLOW  (*)    APPearance CLEAR (*)    All other components within normal limits  SARS CORONAVIRUS 2 (HOSPITAL ORDER, [facility])  CULTURE, BLOOD (ROUTINE X 2)  CULTURE, BLOOD (ROUTINE X 2)  URINE CULTURE  BRAIN NATRIURETIC PEPTIDE  LIPASE, BLOOD  LACTIC ACID, PLASMA  TROPONIN I (HIGH SENSITIVITY)  TROPONIN I (HIGH SENSITIVITY)   ____________________________________________  EKG  ED ECG REPORT I, Blake Divine, the attending physician, personally viewed and interpreted this ECG.   Date: 05/15/2019  EKG Time: 2:14 PM  Rate: 122  Rhythm: nonspecific ST and T waves changes, sinus tachycardia  Axis: Normal  Intervals:none  ST&T Change:  TWI in II and III    PROCEDURES  Procedure(s) performed (including Critical Care):  .Critical Care Performed by: Blake Divine, MD Authorized by: Blake Divine, MD   Critical care provider statement:    Critical care time (minutes):  30   Critical care time was exclusive of:  Separately billable procedures and treating other patients and teaching time   Critical care was necessary to treat or prevent imminent or life-threatening deterioration of the following conditions:  Respiratory failure and sepsis   Critical care was time spent personally by me on the following activities:  Discussions with consultants, evaluation of patient's response to treatment, examination of patient, ordering and performing treatments and interventions, ordering and review of laboratory studies, ordering and review of radiographic studies, pulse oximetry, re-evaluation of patient's condition, obtaining history from patient or surrogate and review of old charts   I assumed direction of critical care for this patient from another provider in my specialty: no       ____________________________________________   INITIAL IMPRESSION / ASSESSMENT AND PLAN / ED COURSE       63 year old male with history of alcohol abuse and recurrent  aspiration presents to the ED complaining of shortness of breath and abdominal pain.  Patient reports he has been feeling increasingly short of breath over about the past week which is been associated with a nonproductive cough.  Given tachycardia and tachypnea, concern for sepsis and sepsis process initiated with broad-spectrum antibiotics.  Patient also treated with duo nebs with gradual improvement in his respirations, maintaining sats on nasal cannula.  There is concern for recurrent aspiration as a cause of his sepsis although chest x-ray unremarkable, will need to further investigate with CT of chest as well as his abdomen given discomfort there.  Patient turned over to Dr. Kerman Passey pending CT results and plan for admission.      ____________________________________________   FINAL CLINICAL IMPRESSION(S) / ED DIAGNOSES  Final diagnoses:  Sepsis with acute hypoxic respiratory failure without septic shock, due to unspecified organism ([place])  Hypoxia  SOB (shortness of breath)  Epigastric pain     ED Discharge Orders    None       Note:  This document was prepared using Dragon voice recognition software and may include unintentional dictation errors.   Blake Divine, MD 05/15/19 [phone]

## 2019-05-15 NOTE — H&P (Signed)
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  Patient was recently admitted on 03/31/2019 with acute respiratory failure with hypoxia secondary to multilobar pneumonia due to aspiration, encephalopathic secondary to alcohol abuse, staph epidermidis bacteremia, and acute GI bleed.  During the course of his hospitalization, patient was treated with varying, Unasyn and Rocephin.  He also had endoscopy for acute GI bleed with peptic ulcer disease and gastritis noted, he was transfused and placed on Protonix.  Patient was discharged on 04/27/2021 hotel with plan to move into an apartment a week later.  Since discharge, patient states he has not been feeling well.  Over the first 5 days he has had difficulty keeping food down, reports multiple episode of nonbilious and non-bloody vomiting as well as nonbloody diarrhea.  Symptoms associated with abdominal pain, nausea, sore throat and intractable hiccups.  Denies chest pain, fevers or chills, recent travel or sick contact.  On arrival to the ED, he was afebrile with blood pressure 120/109 mm Hg and pulse rate 142 beats/min respiration 32, oxygen saturation 86% on room air. There were no focal neurological deficits; he was alert and oriented x4 but notably  uncomfortable.  Initial labs revealed lactic acid 4.4, glucose 112, slightly elevated anion gap 16, alk phos 157, WBC 10.5, BNP 86, lipase 39, initial troponin 7 with repeat 7, COVID-19 negative, and UA negative for UTI. CT chest showed underlying diffuse but patchy interstitial and groundglass process concerning for pulmonary edema or atypical pneumonia, moderate size hiatal hernia with significant lower esophageal wall thickening.  Patient was started on empiric antibiotics with IV Vanco, cefepime and Flagyl.  Given the patient's hypoxia with elevated lactic and CT findings patient will be admitted to the hospitalist service for further work-up and treatment.  PAST MEDICAL HISTORY:   Past Medical History:  Diagnosis Date   ED (erectile dysfunction)    Hypercholesteremia    Hypertension    Lung cancer ([place])    PAST SURGICAL HISTORY:   Past Surgical History:  Procedure Laterality Date   HERNIA REPAIR  1958   REPLACEMENT TOTAL KNEE BILATERAL  2009   TONSILLECTOMY  2008    SOCIAL HISTORY:   Social History   Tobacco Use   Smoking status: Never Smoker   Smokeless tobacco: Never Used  Substance Use Topics   Alcohol use: No    Frequency: Never    Comment: Quit 2 days ago    FAMILY HISTORY:   Family History  Problem Relation Age of Onset   Cancer Mother    Hypertension Father    Cancer Paternal Grandmother     DRUG ALLERGIES:   Allergies  Allergen Reactions   Sulfa Antibiotics Nausea Only    REVIEW OF SYSTEMS:   Review of Systems  Constitutional: Positive for malaise/fatigue. Negative for chills, fever and weight loss.  HENT: Positive for sore throat. Negative for congestion and hearing loss.   Eyes: Negative for blurred vision and double vision.  Respiratory: Positive for cough and shortness of breath. Negative for wheezing.   Cardiovascular: Positive for leg swelling. Negative for chest pain, palpitations and orthopnea.  Gastrointestinal: Positive for  abdominal pain, diarrhea, nausea and vomiting.  Genitourinary: Negative for dysuria and urgency.  Musculoskeletal: Negative for myalgias.  Skin: Negative for rash.  Neurological: Negative for dizziness, sensory change, speech change, focal weakness and headaches.  Psychiatric/Behavioral: Negative for depression.   MEDICATIONS AT HOME:   Prior to Admission medications   Medication Sig Start Date End Date Taking? Authorizing Provider  cloNIDine (CATAPRES) 0.1 MG tablet Take 1 tablet (0.1 mg total) by mouth 2 (two) times daily. 04/27/19  Yes Sainani, Belia Heman, MD  folic acid (FOLVITE) 1 MG tablet Take 1 tablet (1 mg total) by mouth daily. 04/27/19 06/26/19 Yes Sainani, Belia Heman, MD  Multiple Vitamin (MULTIVITAMIN WITH MINERALS) TABS tablet Take 1 tablet by mouth daily. 12/12/17  Yes Salary, Holly Bodily D, MD  niacin 500 MG tablet Take 1 tablet (500 mg total) by mouth at bedtime. 04/27/19 06/26/19 Yes Sainani, Belia Heman, MD  omeprazole (PRILOSEC) 40 MG capsule Take 1 capsule (40 mg total) by mouth daily. 04/27/19 06/26/19 Yes Sainani, Belia Heman, MD  QUEtiapine (SEROQUEL) 25 MG tablet Take 1 tablet (25 mg total) by mouth 2 (two) times daily. 04/27/19 06/26/19 Yes Sainani, Belia Heman, MD  thiamine 100 MG tablet Take 1 tablet (100 mg total) by mouth daily. 04/27/19 06/26/19 Yes Sainani, Belia Heman, MD  traZODone (DESYREL) 50 MG tablet Take 1 tablet (50 mg total) by mouth at bedtime as needed for sleep. 04/27/19 06/26/19 Yes Sainani, Belia Heman, MD      VITAL SIGNS:  Blood pressure (!) 133/99, pulse (!) 146, temperature 97.6 F (36.4 C), temperature source Skin, resp. rate (!) 30, weight 83.8 kg, SpO2 95 %.  PHYSICAL EXAMINATION:   Physical Exam  GENERAL:  63 y.o.-year-old patient lying in the bed with no acute distress.  EYES: Pupils equal, round, reactive to light and accommodation. No scleral icterus. Extraocular muscles intact.  HEENT: Head atraumatic, normocephalic. Oropharynx and nasopharynx clear.  NECK:  Supple, no  jugular venous distention. No thyroid enlargement, no tenderness.  LUNGS: Decreased breath sounds bilaterally, no wheezing, rales,rhonchi or crepitation. Mild use of accessory muscles of respiration.  CARDIOVASCULAR: S1, S2 normal. No murmurs, rubs, or gallops.  ABDOMEN: Soft, abdominal tenderness worse on the left lower quadrant region.  Mildly distended. Bowel sounds present. No organomegaly or mass.  EXTREMITIES:Bilateral pedal edema with cyanosis. No clubbing. No rash or lesions. + pedal pulses MUSCULOSKELETAL: Normal bulk, and power was 5+ grip and elbow, knee, and ankle flexion and extension bilaterally.  NEUROLOGIC:Alert and oriented x 3. CN 2-12 intact. Sensation to light touch and cold stimuli intact bilaterally. Babinski is downgoing. DTR's (biceps, patellar, and achilles) 2+ and symmetric throughout. Gait not tested due to safety concern. PSYCHIATRIC: The patient is alert and oriented x 3.  SKIN: Bruising and lesion (abrasions to bilateral knees and lower legs)  DATA REVIEWED:  LABORATORY PANEL:   CBC Recent Labs  Lab 05/15/19 1405  WBC 10.5  HGB 9.1*  HCT 31.3*  PLT 368   ------------------------------------------------------------------------------------------------------------------  Chemistries  Recent Labs  Lab 05/15/19 1405  NA 136  K 3.8  CL 93*  CO2 27  GLUCOSE 112*  BUN 6*  CREATININE 0.47*  CALCIUM 9.7  AST 33  ALT 31  ALKPHOS 157*  BILITOT 0.5   ------------------------------------------------------------------------------------------------------------------  Cardiac Enzymes No results for input(s): TROPONINI in the last 168 hours. ------------------------------------------------------------------------------------------------------------------  RADIOLOGY:  Ct Chest W Contrast  Result Date: 05/15/2019 CLINICAL DATA:  Cough, shortness of breath, abdominal pain, sore throat and diarrhea for 5 days. EXAM: CT CHEST, ABDOMEN, AND PELVIS WITH CONTRAST  TECHNIQUE: Multidetector CT imaging of the chest, abdomen and pelvis was performed following the standard protocol during bolus administration of intravenous contrast. CONTRAST:  155m OMNIPAQUE IOHEXOL 300 MG/ML  SOLN COMPARISON:  CT scan 12/09/2017 FINDINGS: CT CHEST FINDINGS Cardiovascular: The heart is within normal limits in size. No pericardial effusion. There is mild tortuosity and moderate calcification of the thoracic aorta. The branch vessels are patent. Age advanced three-vessel coronary artery calcifications. Mediastinum/Nodes: Borderline enlarged mediastinal lymph nodes, progressive since prior study. Right paratracheal node on image number 13 measures 10 mm. 11 mm precarinal lymph node on image number 21. 13 mm right-sided subcarinal lymph node on image number 29. There is a small to moderate-sized hiatal hernia along with fairly marked distal esophageal wall thickening. Findings could be due to reflux esophagitis but endoscopy may be helpful to exclude the possibility of a distal esophageal neoplasm. Lungs/Pleura: Patchy ground-glass opacity in both lungs suggesting a partial airspace filling process such as edema or hemorrhage or inflammation/alveolitis. There is significant breathing motion artifact. No pleural effusions. Possible 6 mm nodule at the right lung base. Musculoskeletal: No chest wall mass, supraclavicular or axillary lymphadenopathy. No significant bony findings. No evidence of metastatic disease. CT ABDOMEN PELVIS FINDINGS Hepatobiliary: Severe and diffuse fatty infiltration of the liver but no focal hepatic lesions or intrahepatic biliary dilatation. The gallbladder is grossly normal. No common bile duct dilatation. Pancreas: No mass, inflammation or ductal dilatation. Spleen: Normal size.  No focal lesions. Adrenals/Urinary Tract: Mild nodularity of both adrenal glands is stable. No renal, ureteral or bladder calculi or mass. Stomach/Bowel: The stomach, duodenum, small bowel and  colon are unremarkable. No acute inflammatory changes, mass lesions or obstructive findings. The terminal ileum appears normal. Suspect prior appendectomy. Vascular/Lymphatic: Age advanced atherosclerotic calcifications involving the aorta and iliac arteries. No aneurysm or dissection. The major venous structures are patent. No mesenteric or retroperitoneal mass or adenopathy. Reproductive: The prostate gland and seminal vesicles are unremarkable. Other: No pelvic mass or adenopathy. No free pelvic fluid collections. No inguinal mass or adenopathy. No abdominal wall hernia or subcutaneous lesions. Musculoskeletal: No significant bony findings. IMPRESSION: 1. Examination of the lungs is limited by significant breathing motion artifact. However, there is an underlying diffuse but patchy interstitial and ground-glass process which could be pulmonary edema or atypical pneumonia. Aspiration pneumonitis would be another possibility. 2. Possible nodule at the right lung base. Patient may need a follow-up chest CT 1 better able to hold his breath. 3. Borderline but definitely enlarged mediastinal and hilar lymph nodes when compared to the prior chest CT. 4. Moderate-sized hiatal hernia and significant lower esophageal wall thickening. This could be due to reflux esophagitis but could not exclude infiltrating neoplasm. Endoscopic evaluation may be helpful. 5. No acute abdominal/pelvic findings, mass lesions or lymphadenopathy. 6. Diffuse fatty infiltration of the liver. 7. Age advanced atherosclerotic calcifications involving the aorta and iliac arteries and coronary arteries. Electronically Signed   By: PMarijo SanesM.D.   On: 05/15/2019 16:51   Ct Abdomen Pelvis W Contrast  Result Date: 05/15/2019 CLINICAL DATA:  Cough, shortness of breath, abdominal pain, sore throat and diarrhea for 5  days. EXAM: CT CHEST, ABDOMEN, AND PELVIS WITH CONTRAST TECHNIQUE: Multidetector CT imaging of the chest, abdomen and pelvis was  performed following the standard protocol during bolus administration of intravenous contrast. CONTRAST:  151m OMNIPAQUE IOHEXOL 300 MG/ML  SOLN COMPARISON:  CT scan 12/09/2017 FINDINGS: CT CHEST FINDINGS Cardiovascular: The heart is within normal limits in size. No pericardial effusion. There is mild tortuosity and moderate calcification of the thoracic aorta. The branch vessels are patent. Age advanced three-vessel coronary artery calcifications. Mediastinum/Nodes: Borderline enlarged mediastinal lymph nodes, progressive since prior study. Right paratracheal node on image number 13 measures 10 mm. 11 mm precarinal lymph node on image number 21. 13 mm right-sided subcarinal lymph node on image number 29. There is a small to moderate-sized hiatal hernia along with fairly marked distal esophageal wall thickening. Findings could be due to reflux esophagitis but endoscopy may be helpful to exclude the possibility of a distal esophageal neoplasm. Lungs/Pleura: Patchy ground-glass opacity in both lungs suggesting a partial airspace filling process such as edema or hemorrhage or inflammation/alveolitis. There is significant breathing motion artifact. No pleural effusions. Possible 6 mm nodule at the right lung base. Musculoskeletal: No chest wall mass, supraclavicular or axillary lymphadenopathy. No significant bony findings. No evidence of metastatic disease. CT ABDOMEN PELVIS FINDINGS Hepatobiliary: Severe and diffuse fatty infiltration of the liver but no focal hepatic lesions or intrahepatic biliary dilatation. The gallbladder is grossly normal. No common bile duct dilatation. Pancreas: No mass, inflammation or ductal dilatation. Spleen: Normal size.  No focal lesions. Adrenals/Urinary Tract: Mild nodularity of both adrenal glands is stable. No renal, ureteral or bladder calculi or mass. Stomach/Bowel: The stomach, duodenum, small bowel and colon are unremarkable. No acute inflammatory changes, mass lesions or  obstructive findings. The terminal ileum appears normal. Suspect prior appendectomy. Vascular/Lymphatic: Age advanced atherosclerotic calcifications involving the aorta and iliac arteries. No aneurysm or dissection. The major venous structures are patent. No mesenteric or retroperitoneal mass or adenopathy. Reproductive: The prostate gland and seminal vesicles are unremarkable. Other: No pelvic mass or adenopathy. No free pelvic fluid collections. No inguinal mass or adenopathy. No abdominal wall hernia or subcutaneous lesions. Musculoskeletal: No significant bony findings. IMPRESSION: 1. Examination of the lungs is limited by significant breathing motion artifact. However, there is an underlying diffuse but patchy interstitial and ground-glass process which could be pulmonary edema or atypical pneumonia. Aspiration pneumonitis would be another possibility. 2. Possible nodule at the right lung base. Patient may need a follow-up chest CT 1 better able to hold his breath. 3. Borderline but definitely enlarged mediastinal and hilar lymph nodes when compared to the prior chest CT. 4. Moderate-sized hiatal hernia and significant lower esophageal wall thickening. This could be due to reflux esophagitis but could not exclude infiltrating neoplasm. Endoscopic evaluation may be helpful. 5. No acute abdominal/pelvic findings, mass lesions or lymphadenopathy. 6. Diffuse fatty infiltration of the liver. 7. Age advanced atherosclerotic calcifications involving the aorta and iliac arteries and coronary arteries. Electronically Signed   By: PMarijo SanesM.D.   On: 05/15/2019 16:51   Dg Chest Port 1 View  Result Date: 05/15/2019 CLINICAL DATA:  Cough and short of breath.  History of lung cancer EXAM: PORTABLE CHEST 1 VIEW COMPARISON:  04/24/2019 FINDINGS: Improved aeration of the lungs bilaterally specially on the right. Mild residual airspace disease in the right lung base. Negative for heart failure or effusion. Heart size  upper normal. IMPRESSION: Improved aeration of the lungs. Near complete clearing of airspace disease in the  right lung. Electronically Signed   By: Franchot Gallo M.D.   On: 05/15/2019 13:53    EKG:  EKG: unchanged from previous tracings, sinus tachycardia. Vent. rate 122 BPM PR interval * ms QRS duration 94 ms QT/QTc 331/472 ms P-R-T axes 49 52 -9 IMPRESSION AND PLAN:   63 y.o. 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  1. Acute respiratory failure with hypoxia -likely secondary to pneumonia - CT scan of the chest consistent with multifocal pneumonia/pneumonitis - Admit to stepdown unit - Supplemental oxygen - Management per ICU team  2. Sepsis -likely secondary to pneumonia.  Patient presenting with tachycardia, RR in the 30's, hypoxia with elevated lactic acid - Patient with recent staph epidermidis bacteremia - Check procalcitonin - Blood cultures pending - UA negative for UTI, urine cultures pending - Check strep and Legionella urinary antigen - IVFs with LR - Continue IV antibiotics with cefepime, discussed with ICU attending Dr. Leonidas Romberg.  3. Diarrhea - Check C. Difficile - Check GI panel - Received cefepime, Vanco and Flagyl in the ED, will continue cefepime pending C. Difficile  4. Hypertension - Continue clonidine  5. Alcoholic gastritis -continue Protonix  6. Hx of EtOH  No signs of withdrawal - Daily Thiamine, Folate, MVI once tolerating PO - SW consult for cessation resources - PT/OT evaluation for mobility   All the records are reviewed and case discussed with ED provider. Management plans discussed with the patient, family and they are in agreement.  CODE  STATUS: FULL  TOTAL TIME TAKING CARE OF THIS PATIENT: 50 minutes.    on 05/15/2019 at 6:49 PM  Rufina Falco, DNP, FNP-BC [facility] Nurse Practitioner Between 7am to 6pm - Pager [phone]  After 6pm go to [email or web address] - password [email or web address] [facility]  Office  [phone]  CC: Primary care physician; Patient, [name] Per

## 2019-05-15 NOTE — ED Notes (Signed)
Admitting provider aware of vital signs

## 2019-05-15 NOTE — ED Triage Notes (Signed)
C/O cough, SOB, abdominal pain, sore throat, diarrhea x 5 days.

## 2019-05-15 NOTE — Consult Note (Signed)
CODE SEPSIS - PHARMACY COMMUNICATION  **Broad Spectrum Antibiotics should be administered within 1 hour of Sepsis diagnosis**  Time Code Sepsis Called/Page Received: 1334  Antibiotics Ordered: cefepime/vancomycin/flagyl  Time of 1st antibiotic administration: 1450  Additional action taken by pharmacy: Pharmacy messaged Dr. Charna Archer for abx orders and RN to give meds. Pt was a COVID rule out was able to give abx on time due to wait time for staff safety per RN.   If necessary, Name of Provider/Nurse Contacted: Achilles Dunk ,PharmD Clinical Pharmacist  05/15/2019  2:56 PM

## 2019-05-15 NOTE — Consult Note (Signed)
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  COVID-19 PCR is negative.  SIGNIFICANT EVENTS  8/4>> admission to stepdown  STUDIES:  CT Chest w/ contrast 8/4>>1. Examination of the lungs is limited by significant breathing motion artifact. However, there is an underlying diffuse but patchy interstitial and ground-glass process which could be pulmonary edema or atypical pneumonia. Aspiration pneumonitis would be another possibility. 2. Possible nodule at the right lung base. Patient may need a follow-up chest CT 1 better able to hold his breath. 3. Borderline but definitely enlarged mediastinal and hilar lymph nodes when compared to the prior chest CT.  CT Abdomen & Pelvis 8/4>> 1. Moderate-sized hiatal hernia and significant lower esophageal wall thickening. This could be due to reflux esophagitis but could not exclude infiltrating neoplasm. Endoscopic evaluation may be helpful. 2. No acute abdominal/pelvic findings, mass lesions or lymphadenopathy. 3. Diffuse fatty infiltration of the liver. 4. 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  He reports that his vomiting was nonbilious and non-bloody, and his diarrhea was non-bloody. He denied chest pain, fever/chills, or sick contacts.  Upon arrival to the ED he was noted to be afebrile with blood pressure 120/109, pulse 142 bpm, respiratory rate 32, oxygen saturation 96% on room air.  Initial work-up in the ED revealed lactic acid 4.4, anion gap 16, alkaline phos 157, WBC 10.5, BNP 86, high sensitivity troponin 7, lipase 39.  His COVID-19 PCR is negative, UA is negative for UTI.  CT chest showed diffuse but patchy interstitial and groundglass opacities concerning for pulmonary edema vs. atypical pneumonia vs. aspiration pneumonitis.  CT abdomen and pelvis is negative for any acute findings.  He received empiric antibiotics with IV cefepime, Flagyl, vancomycin, along with 30 cc/kg IV fluid resuscitation.  He is being admitted to stepdown unit for further work-up and treatment of acute hypoxic respiratory failure and sepsis secondary to pneumonia.  PCCM is consulted for further management.  Of note he was recently admitted to [facility] on 03/31/19 with acute hypoxic respiratory failure secondary to aspiration pneumonia, encephalopathy secondary to alcohol abuse, staph epidermidis bacteremia, and acute GI bleed.  During that hospitalization he underwent endoscopy which showed peptic ulcer disease and gastritis.  He was discharged on 04/28/2019.  PAST MEDICAL HISTORY :   has a past medical history of ED (erectile dysfunction), Hypercholesteremia, Hypertension, and Lung cancer  ([place]).  has a past  surgical history that includes Replacement total knee bilateral (2009); Tonsillectomy (2008); and Hernia repair (1958). Prior to Admission medications   Medication Sig Start Date End Date Taking? Authorizing Provider  cloNIDine (CATAPRES) 0.1 MG tablet Take 1 tablet (0.1 mg total) by mouth 2 (two) times daily. 04/27/19  Yes Sainani, Belia Heman, MD  folic acid (FOLVITE) 1 MG tablet Take 1 tablet (1 mg total) by mouth daily. 04/27/19 06/26/19 Yes Sainani, Belia Heman, MD  Multiple Vitamin (MULTIVITAMIN WITH MINERALS) TABS tablet Take 1 tablet by mouth daily. 12/12/17  Yes Salary, Holly Bodily D, MD  niacin 500 MG tablet Take 1 tablet (500 mg total) by mouth at bedtime. 04/27/19 06/26/19 Yes Sainani, Belia Heman, MD  omeprazole (PRILOSEC) 40 MG capsule Take 1 capsule (40 mg total) by mouth daily. 04/27/19 06/26/19 Yes Sainani, Belia Heman, MD  QUEtiapine (SEROQUEL) 25 MG tablet Take 1 tablet (25 mg total) by mouth 2 (two) times daily. 04/27/19 06/26/19 Yes Sainani, Belia Heman, MD  thiamine 100 MG tablet Take 1 tablet (100 mg total) by mouth daily. 04/27/19 06/26/19 Yes Sainani, Belia Heman, MD  traZODone (DESYREL) 50 MG tablet Take 1 tablet (50 mg total) by mouth at bedtime as needed for sleep. 04/27/19 06/26/19 Yes Henreitta Leber, MD   Allergies  Allergen Reactions   Sulfa Antibiotics Nausea Only    FAMILY HISTORY:  family history includes Cancer in his mother and paternal grandmother; Hypertension in his father. SOCIAL HISTORY:  reports that he has never smoked. He has never used smokeless tobacco. He reports that he does not drink alcohol or use drugs.   COVID-19 DISASTER DECLARATION:  FULL CONTACT PHYSICAL EXAMINATION WAS NOT POSSIBLE DUE TO TREATMENT OF COVID-19 AND  CONSERVATION OF PERSONAL PROTECTIVE EQUIPMENT, LIMITED EXAM FINDINGS INCLUDE-  Patient assessed or the symptoms described in the history of present illness.  In the context of the Global COVID-19 pandemic, which necessitated consideration  that the patient might be at risk for infection with the SARS-CoV-2 virus that causes COVID-19, Institutional protocols and algorithms that pertain to the evaluation of patients at risk for COVID-19 are in a state of rapid change based on information released by regulatory bodies including the CDC and federal and state organizations. These policies and algorithms were followed during the patient's care while in hospital.  REVIEW OF SYSTEMS:  Positives in BOLD Constitutional: Negative for fever, chills, weight loss, malaise/fatigue and diaphoresis.  HENT: Negative for hearing loss, ear pain, nosebleeds, congestion, +sore throat, neck pain, tinnitus and ear discharge.   Eyes: Negative for blurred vision, double vision, photophobia, pain, discharge and redness.  Respiratory: Negative for cough, hemoptysis, sputum production, shortness of breath, wheezing and stridor.   Cardiovascular: Negative for chest pain, palpitations, orthopnea, claudication, +leg swelling and PND.  Gastrointestinal: Negative for heartburn, +nausea, +Hiccups, vomiting, abdominal pain, diarrhea, constipation, blood in stool and melena.  Genitourinary: Negative for dysuria, urgency, frequency, hematuria and flank pain.  Musculoskeletal: Negative for myalgias, back pain, joint pain and falls.  Skin: Negative for itching and rash.  Neurological: Negative for dizziness, tingling, tremors, sensory change, speech change, focal weakness, seizures, loss of consciousness, weakness and headaches.  Endo/Heme/Allergies: Negative for environmental allergies and polydipsia. Does not bruise/bleed easily.  SUBJECTIVE:  -Reports mild nausea and hiccups (abdominal pain with hiccups) -Reports his RLE is swollen, but not red or painful -Denies chest pain, shortness breath, sputum production, fever/chills -He is concerned about going into ETOH withdrawal as he has before during hospitalization  VITAL SIGNS: Temp:  [  97.6 F (36.4 C)] 97.6 F (36.4  C) (08/04 1315) Pulse Rate:  [121-146] 137 (08/04 2015) Resp:  [13-32] 25 (08/04 2015) BP: (113-164)/(87-148) 139/100 (08/04 2015) SpO2:  [86 %-100 %] 97 % (08/04 2015) Weight:  [83.8 kg] 83.8 kg (08/04 1308)  PHYSICAL EXAMINATION: General: Acutely ill-appearing male, sitting in bed, on 2 L nasal cannula, no acute distress Neuro: Awake, alert and oriented x4, mildly anxious, follows commands, no focal deficits, speech clear, pupils PERRLA HEENT: Atraumatic, normocephalic, neck supple, no JVD Cardiovascular: Tachycardia, regular rhythm, S1-S2, no murmurs rubs or gallops, 2+ pedal pulses bilaterally Lungs: Diminished breath sounds bilaterally, no wheezing, even, nonlabored, normal effort Abdomen: Soft, mildly tender, nondistended, no guarding or rebound tenderness, bowel sounds positive x4 Musculoskeletal: No deformities, normal bulk and tone, 5/5 strength all extremities, bilateral pedal edema (R>L) Skin: Warm and dry, scattered abrasions to bilateral lower extremities; no obvious rashes or lesions  Recent Labs  Lab 05/15/19 1405  NA 136  K 3.8  CL 93*  CO2 27  BUN 6*  CREATININE 0.47*  GLUCOSE 112*   Recent Labs  Lab 05/15/19 1405  HGB 9.1*  HCT 31.3*  WBC 10.5  PLT 368   Ct Chest W Contrast  Result Date: 05/15/2019 CLINICAL DATA:  Cough, shortness of breath, abdominal pain, sore throat and diarrhea for 5 days. EXAM: CT CHEST, ABDOMEN, AND PELVIS WITH CONTRAST TECHNIQUE: Multidetector CT imaging of the chest, abdomen and pelvis was performed following the standard protocol during bolus administration of intravenous contrast. CONTRAST:  138mL OMNIPAQUE IOHEXOL 300 MG/ML  SOLN COMPARISON:  CT scan 12/09/2017 FINDINGS: CT CHEST FINDINGS Cardiovascular: The heart is within normal limits in size. No pericardial effusion. There is mild tortuosity and moderate calcification of the thoracic aorta. The branch vessels are patent. Age advanced three-vessel coronary artery calcifications.  Mediastinum/Nodes: Borderline enlarged mediastinal lymph nodes, progressive since prior study. Right paratracheal node on image number 13 measures 10 mm. 11 mm precarinal lymph node on image number 21. 13 mm right-sided subcarinal lymph node on image number 29. There is a small to moderate-sized hiatal hernia along with fairly marked distal esophageal wall thickening. Findings could be due to reflux esophagitis but endoscopy may be helpful to exclude the possibility of a distal esophageal neoplasm. Lungs/Pleura: Patchy ground-glass opacity in both lungs suggesting a partial airspace filling process such as edema or hemorrhage or inflammation/alveolitis. There is significant breathing motion artifact. No pleural effusions. Possible 6 mm nodule at the right lung base. Musculoskeletal: No chest wall mass, supraclavicular or axillary lymphadenopathy. No significant bony findings. No evidence of metastatic disease. CT ABDOMEN PELVIS FINDINGS Hepatobiliary: Severe and diffuse fatty infiltration of the liver but no focal hepatic lesions or intrahepatic biliary dilatation. The gallbladder is grossly normal. No common bile duct dilatation. Pancreas: No mass, inflammation or ductal dilatation. Spleen: Normal size.  No focal lesions. Adrenals/Urinary Tract: Mild nodularity of both adrenal glands is stable. No renal, ureteral or bladder calculi or mass. Stomach/Bowel: The stomach, duodenum, small bowel and colon are unremarkable. No acute inflammatory changes, mass lesions or obstructive findings. The terminal ileum appears normal. Suspect prior appendectomy. Vascular/Lymphatic: Age advanced atherosclerotic calcifications involving the aorta and iliac arteries. No aneurysm or dissection. The major venous structures are patent. No mesenteric or retroperitoneal mass or adenopathy. Reproductive: The prostate gland and seminal vesicles are unremarkable. Other: No pelvic mass or adenopathy. No free pelvic fluid collections. No  inguinal mass or adenopathy. No abdominal wall hernia or subcutaneous lesions. Musculoskeletal: No  significant bony findings. IMPRESSION: 1. Examination of the lungs is limited by significant breathing motion artifact. However, there is an underlying diffuse but patchy interstitial and ground-glass process which could be pulmonary edema or atypical pneumonia. Aspiration pneumonitis would be another possibility. 2. Possible nodule at the right lung base. Patient may need a follow-up chest CT 1 better able to hold his breath. 3. Borderline but definitely enlarged mediastinal and hilar lymph nodes when compared to the prior chest CT. 4. Moderate-sized hiatal hernia and significant lower esophageal wall thickening. This could be due to reflux esophagitis but could not exclude infiltrating neoplasm. Endoscopic evaluation may be helpful. 5. No acute abdominal/pelvic findings, mass lesions or lymphadenopathy. 6. Diffuse fatty infiltration of the liver. 7. Age advanced atherosclerotic calcifications involving the aorta and iliac arteries and coronary arteries. Electronically Signed   By: Marijo Sanes M.D.   On: 05/15/2019 16:51   Ct Abdomen Pelvis W Contrast  Result Date: 05/15/2019 CLINICAL DATA:  Cough, shortness of breath, abdominal pain, sore throat and diarrhea for 5 days. EXAM: CT CHEST, ABDOMEN, AND PELVIS WITH CONTRAST TECHNIQUE: Multidetector CT imaging of the chest, abdomen and pelvis was performed following the standard protocol during bolus administration of intravenous contrast. CONTRAST:  145mL OMNIPAQUE IOHEXOL 300 MG/ML  SOLN COMPARISON:  CT scan 12/09/2017 FINDINGS: CT CHEST FINDINGS Cardiovascular: The heart is within normal limits in size. No pericardial effusion. There is mild tortuosity and moderate calcification of the thoracic aorta. The branch vessels are patent. Age advanced three-vessel coronary artery calcifications. Mediastinum/Nodes: Borderline enlarged mediastinal lymph nodes, progressive  since prior study. Right paratracheal node on image number 13 measures 10 mm. 11 mm precarinal lymph node on image number 21. 13 mm right-sided subcarinal lymph node on image number 29. There is a small to moderate-sized hiatal hernia along with fairly marked distal esophageal wall thickening. Findings could be due to reflux esophagitis but endoscopy may be helpful to exclude the possibility of a distal esophageal neoplasm. Lungs/Pleura: Patchy ground-glass opacity in both lungs suggesting a partial airspace filling process such as edema or hemorrhage or inflammation/alveolitis. There is significant breathing motion artifact. No pleural effusions. Possible 6 mm nodule at the right lung base. Musculoskeletal: No chest wall mass, supraclavicular or axillary lymphadenopathy. No significant bony findings. No evidence of metastatic disease. CT ABDOMEN PELVIS FINDINGS Hepatobiliary: Severe and diffuse fatty infiltration of the liver but no focal hepatic lesions or intrahepatic biliary dilatation. The gallbladder is grossly normal. No common bile duct dilatation. Pancreas: No mass, inflammation or ductal dilatation. Spleen: Normal size.  No focal lesions. Adrenals/Urinary Tract: Mild nodularity of both adrenal glands is stable. No renal, ureteral or bladder calculi or mass. Stomach/Bowel: The stomach, duodenum, small bowel and colon are unremarkable. No acute inflammatory changes, mass lesions or obstructive findings. The terminal ileum appears normal. Suspect prior appendectomy. Vascular/Lymphatic: Age advanced atherosclerotic calcifications involving the aorta and iliac arteries. No aneurysm or dissection. The major venous structures are patent. No mesenteric or retroperitoneal mass or adenopathy. Reproductive: The prostate gland and seminal vesicles are unremarkable. Other: No pelvic mass or adenopathy. No free pelvic fluid collections. No inguinal mass or adenopathy. No abdominal wall hernia or subcutaneous lesions.  Musculoskeletal: No significant bony findings. IMPRESSION: 1. Examination of the lungs is limited by significant breathing motion artifact. However, there is an underlying diffuse but patchy interstitial and ground-glass process which could be pulmonary edema or atypical pneumonia. Aspiration pneumonitis would be another possibility. 2. Possible nodule at the right lung base. Patient  may need a follow-up chest CT 1 better able to hold his breath. 3. Borderline but definitely enlarged mediastinal and hilar lymph nodes when compared to the prior chest CT. 4. Moderate-sized hiatal hernia and significant lower esophageal wall thickening. This could be due to reflux esophagitis but could not exclude infiltrating neoplasm. Endoscopic evaluation may be helpful. 5. No acute abdominal/pelvic findings, mass lesions or lymphadenopathy. 6. Diffuse fatty infiltration of the liver. 7. Age advanced atherosclerotic calcifications involving the aorta and iliac arteries and coronary arteries. Electronically Signed   By: Marijo Sanes M.D.   On: 05/15/2019 16:51   Dg Chest Port 1 View  Result Date: 05/15/2019 CLINICAL DATA:  Cough and short of breath.  History of lung cancer EXAM: PORTABLE CHEST 1 VIEW COMPARISON:  04/24/2019 FINDINGS: Improved aeration of the lungs bilaterally specially on the right. Mild residual airspace disease in the right lung base. Negative for heart failure or effusion. Heart size upper normal. IMPRESSION: Improved aeration of the lungs. Near complete clearing of airspace disease in the right lung. Electronically Signed   By: Franchot Gallo M.D.   On: 05/15/2019 13:53    ASSESSMENT / PLAN:  Acute Hypoxic Respiratory Failure in setting of Pneumonia Hx: Recurrent Aspiration Pneumonia -Supplemental O2 as needed to maintain O2 sats greater than 92% -Follow intermittent chest x-ray and ABG as needed -CT Chest consistent with multi-focal pneumonia/pneumonitis -Continue cefepime -Follow  cultures  Sepsis secondary to pneumonia -Monitor fever curve -Trend WBCs and procalcitonin -Follow pan cultures as above -Received cefepime, Flagyl, vancomycin in ED; continue cefepime  Lactic acidosis -Monitor I&O's / urinary output -Follow BMP -Ensure adequate renal perfusion -Avoid nephrotoxic agents as able -Replace electrolytes as indicated -Trend lactic acid -IV fluids  Hx Hypertension -Cardiac monitoring -Maintain MAP >73  Alcoholic gastritis Diarrhea Nausea Hx: GI Bleed, PUD, Gastritis -Continue Protonix -Check C. difficile and GI panel PCR -Prn Antiemetics -CT Abdomen & Pelvis negative for acute abnormality  Anemia without s/sx of bleeding -Monitor for S/Sx of bleeding -Trend CBC -Lovenox for VTE Prophylaxis  -Transfuse for Hgb <7  History of EtOH abuse, currently no signs of withdrawal -Continue daily thiamine, and folate, MVI -Watch for signs of withdrawal  -CIWA protocol        Disposition: Stepdown Goals of care: Full code VTE prophylaxis: Lovenox SQ Updates: Updated patient at bedside 05/15/2019  Darel Hong, [facility] Pager: [phone] Cell: [phone]  05/15/2019, 8:47 PM

## 2019-05-15 NOTE — ED Provider Notes (Signed)
-----------------------------------------   5:40 PM on 05/15/2019 -----------------------------------------  Patient care assumed from Dr. Charna Archer.  Patient's lactate is elevated greater than 4.  Patient's coronavirus test is negative.  CT scan of the chest appears consistent multifocal pneumonia/pneumonitis.  Given the patient's hypoxia with elevated lactate and CT findings patient will be admitted to the hospital service for further work-up and treatment.  IV antibiotics have been ordered for the patient.   Harvest Dark, MD 05/15/19 1740

## 2019-05-15 NOTE — Consult Note (Signed)
Pharmacy Antibiotic Note  Carlee Tesfaye is a 63 y.o. male admitted on 05/15/2019 with sepsis.  Pharmacy has been consulted for cefepime dosing.  Plan: Will start cefepime 2 g q8H.   Weight: 184 lb 11.9 oz (83.8 kg)  Temp (24hrs), Avg:97.6 F (36.4 C), Min:97.6 F (36.4 C), Max:97.6 F (36.4 C)  Recent Labs  Lab 05/15/19 1405 05/15/19 1611  WBC 10.5  --   CREATININE 0.47*  --   LATICACIDVEN 4.4* 4.5*    Estimated Creatinine Clearance: 95.7 mL/min (A) (by C-G formula based on SCr of 0.47 mg/dL (L)).    Allergies  Allergen Reactions  . Sulfa Antibiotics Nausea Only    Antimicrobials this admission: 8/4 cefepime >>  8/4 vancomycin x 1 (Total 2000 mg loading dose given) Dose adjustments this admission: None  Microbiology results: 8/4 BCx: pending 8/4 UCx: pending   Thank you for allowing pharmacy to be a part of this patient's care.  Oswald Hillock 05/15/2019 6:54 PM

## 2019-05-15 NOTE — Consult Note (Signed)
PHARMACY -  BRIEF ANTIBIOTIC NOTE   Pharmacy has received consult(s) for sepsis from an ED provider.  The patient's profile has been reviewed for ht/wt/allergies/indication/available labs.    One time order(s) placed for vancomycin, cefepime, flagyl.   Further antibiotics/pharmacy consults should be ordered by admitting physician if indicated.                       Thank you, Oswald Hillock 05/15/2019  2:24 PM

## 2019-05-15 NOTE — Progress Notes (Signed)
[facility] Progress Note Patient Name: George Wade DOB: 10-22-1955 MRN: [id number]   Date of Service  05/15/2019  HPI/Events of Note  57 M ETOH abuse and GI bleed secondary to PUD and gastritis presents with nausea, vomiting and diarrhea. Lactate elevated and was tachycardic. Chest CT with multifocal infiltrates.  eICU Interventions  Patient started of antibiotics and fluids for pneumonia sepsis. Not requiring pressors.        Judd Lien 05/15/2019, 10:33 PM

## 2019-05-15 NOTE — ED Notes (Signed)
ED TO INPATIENT HANDOFF REPORT  ED Nurse Name and Phone #:  Anson Crofts Name/Age/Gender George Wade 63 y.o. male Room/Bed: ED01A/ED01A  Code Status   Code Status: Full Code  Home/SNF/Other Home Patient oriented to: self, place, time and situation Is this baseline? Yes   Triage Complete: Triage complete  Chief Complaint diff breathing  Triage Note C/O cough, SOB, abdominal pain, sore throat, diarrhea x 5 days.   Allergies Allergies  Allergen Reactions  . Sulfa Antibiotics Nausea Only    Level of Care/Admitting Diagnosis ED Disposition    ED Disposition Condition [facility] Area: [place] [[id number]]  Level of Care: Stepdown [14]  Covid Evaluation: Confirmed COVID Negative  Diagnosis: Acute respiratory failure with hypoxia [facility]) [[id number]]  Admitting Physician: Eula Flax  Attending Physician: Rufina Falco ACHIENG [[id number]]  Estimated length of stay: past midnight tomorrow  Certification:: I certify this patient will need inpatient services for at least 2 midnights  PT Class (Do Not Modify): Inpatient [101]  PT Acc Code (Do Not Modify): Private [1]       B Medical/Surgery History Past Medical History:  Diagnosis Date  . ED (erectile dysfunction)   . Hypercholesteremia   . Hypertension   . Lung cancer [facility])    Past Surgical History:  Procedure Laterality Date  . HERNIA REPAIR  1958  . REPLACEMENT TOTAL KNEE BILATERAL  2009  . TONSILLECTOMY  2008     A IV Location/Drains/Wounds Patient Lines/Drains/Airways Status   Active Line/Drains/Airways    Name:   Placement date:   Placement time:   Site:   Days:   Peripheral IV 05/15/19 Left Antecubital   05/15/19    1348    Antecubital   less than 1   Peripheral IV 05/15/19 Left Hand   05/15/19    1413    Hand   less than 1          Intake/Output Last 24 hours  Intake/Output Summary (Last 24 hours) at 05/15/2019 2024 Last data filed at 05/15/2019  1616 Gross per 24 hour  Intake 1200 ml  Output -  Net 1200 ml    Labs/Imaging Results for orders placed or performed during the hospital encounter of 05/15/19 (from the past 48 hour(s))  Lactic acid, plasma     Status: Abnormal   Collection Time: 05/15/19  2:05 PM  Result Value Ref Range   Lactic Acid, Venous 4.4 (HH) 0.5 - 1.9 mmol/L    Comment: CRITICAL RESULT CALLED TO, READ BACK BY AND VERIFIED WITH BILL SMITH 05/15/19 1543 KLW Performed at [facility], [address] [place], [place] [address]   Comprehensive metabolic panel     Status: Abnormal   Collection Time: 05/15/19  2:05 PM  Result Value Ref Range   Sodium 136 135 - 145 mmol/L   Potassium 3.8 3.5 - 5.1 mmol/L   Chloride 93 (L) 98 - 111 mmol/L   CO2 27 22 - 32 mmol/L   Glucose, Bld 112 (H) 70 - 99 mg/dL   BUN 6 (L) 8 - 23 mg/dL   Creatinine, Ser 0.47 (L) 0.61 - 1.24 mg/dL   Calcium 9.7 8.9 - 10.3 mg/dL   Total Protein 6.6 6.5 - 8.1 g/dL   Albumin 3.0 (L) 3.5 - 5.0 g/dL   AST 33 15 - 41 U/L   ALT 31 0 - 44 U/L   Alkaline Phosphatase 157 (H) 38 - 126 U/L   Total  Bilirubin 0.5 0.3 - 1.2 mg/dL   GFR calc non Af Amer >60 >60 mL/min   GFR calc Af Amer >60 >60 mL/min   Anion gap 16 (H) 5 - 15    Comment: Performed at [facility], [address] [place], [place] [address]  CBC WITH DIFFERENTIAL     Status: Abnormal   Collection Time: 05/15/19  2:05 PM  Result Value Ref Range   WBC 10.5 4.0 - 10.5 K/uL   RBC 3.90 (L) 4.22 - 5.81 MIL/uL   Hemoglobin 9.1 (L) 13.0 - 17.0 g/dL   HCT 31.3 (L) 39.0 - 52.0 %   MCV 80.3 80.0 - 100.0 fL   MCH 23.3 (L) 26.0 - 34.0 pg   MCHC 29.1 (L) 30.0 - 36.0 g/dL   RDW 20.3 (H) 11.5 - 15.5 %   Platelets 368 150 - 400 K/uL   nRBC 0.0 0.0 - 0.2 %   Neutrophils Relative % 56 %   Neutro Abs 6.0 1.7 - 7.7 K/uL   Lymphocytes Relative 33 %   Lymphs Abs 3.5 0.7 - 4.0 K/uL   Monocytes Relative 9 %   Monocytes Absolute 0.9 0.1 - 1.0 K/uL   Eosinophils Relative 1 %    Eosinophils Absolute 0.1 0.0 - 0.5 K/uL   Basophils Relative 1 %   Basophils Absolute 0.1 0.0 - 0.1 K/uL   Immature Granulocytes 0 %   Abs Immature Granulocytes 0.03 0.00 - 0.07 K/uL    Comment: Performed at [facility], [address] [place], [place] [address]  Brain natriuretic peptide     Status: None   Collection Time: 05/15/19  2:05 PM  Result Value Ref Range   B Natriuretic Peptide 86.0 0.0 - 100.0 pg/mL    Comment: Performed at [facility], [address] [place], [place] [address]  Lipase, blood     Status: None   Collection Time: 05/15/19  2:05 PM  Result Value Ref Range   Lipase 39 11 - 51 U/L    Comment: Performed at [facility], [place], [place] [address]  Troponin I (High Sensitivity)     Status: None   Collection Time: 05/15/19  2:05 PM  Result Value Ref Range   Troponin I (High Sensitivity) 7 <18 ng/L    Comment: (NOTE) Elevated high sensitivity troponin I (hsTnI) values and significant  changes across serial measurements may suggest ACS but many other  chronic and acute conditions are known to elevate hsTnI results.  Refer to the "Links" section for chest pain algorithms and additional  guidance. Performed at [facility], [address] [place], [place] [address]   SARS Coronavirus 2 [facility] order, Performed in [facility] hospital lab) Nasopharyngeal Nasopharyngeal Swab     Status: None   Collection Time: 05/15/19  2:06 PM   Specimen: Nasopharyngeal Swab  Result Value Ref Range   SARS Coronavirus 2 NEGATIVE NEGATIVE    Comment: (NOTE) If result is NEGATIVE SARS-CoV-2 target nucleic acids are NOT DETECTED. The SARS-CoV-2 RNA is generally detectable in upper and lower  respiratory specimens during the acute phase of infection. The lowest  concentration of SARS-CoV-2 viral copies this assay can detect is 250  copies / mL. A negative result does not preclude SARS-CoV-2 infection  and should not be  used as the sole basis for treatment or other  patient management decisions.  A negative result may occur with  improper specimen collection / handling, submission of  specimen other  than nasopharyngeal swab, presence of viral mutation(s) within the  areas targeted by this assay, and inadequate number of viral copies  (<250 copies / mL). A negative result must be combined with clinical  observations, patient history, and epidemiological information. If result is POSITIVE SARS-CoV-2 target nucleic acids are DETECTED. The SARS-CoV-2 RNA is generally detectable in upper and lower  respiratory specimens dur ing the acute phase of infection.  Positive  results are indicative of active infection with SARS-CoV-2.  Clinical  correlation with patient history and other diagnostic information is  necessary to determine patient infection status.  Positive results do  not rule out bacterial infection or co-infection with other viruses. If result is PRESUMPTIVE POSTIVE SARS-CoV-2 nucleic acids MAY BE PRESENT.   A presumptive positive result was obtained on the submitted specimen  and confirmed on repeat testing.  While 2019 novel coronavirus  (SARS-CoV-2) nucleic acids may be present in the submitted sample  additional confirmatory testing may be necessary for epidemiological  and / or clinical management purposes  to differentiate between  SARS-CoV-2 and other Sarbecovirus currently known to infect humans.  If clinically indicated additional testing with an alternate test  methodology [phone]) is advised. The SARS-CoV-2 RNA is generally  detectable in upper and lower respiratory sp ecimens during the acute  phase of infection. The expected result is Negative. Fact Sheet for Patients:  StrictlyIdeas.no Fact Sheet for Healthcare Providers: BankingDealers.co.za This test is not yet approved or cleared by the [place] FDA and has been authorized  for detection and/or diagnosis of SARS-CoV-2 by FDA under an Emergency Use Authorization (EUA).  This EUA will remain in effect (meaning this test can be used) for the duration of the COVID-19 declaration under Section 564(b)(1) of the Act, 21 U.S.C. section 360bbb-3(b)(1), unless the authorization is terminated or revoked sooner. Performed at [facility] [address] [place], [place] [address]   Urinalysis, Routine w reflex microscopic     Status: Abnormal   Collection Time: 05/15/19  3:36 PM  Result Value Ref Range   Color, Urine YELLOW (A) YELLOW   APPearance CLEAR (A) CLEAR   Specific Gravity, Urine 1.012 1.005 - 1.030   pH 8.0 5.0 - 8.0   Glucose, UA NEGATIVE NEGATIVE mg/dL   Hgb urine dipstick NEGATIVE NEGATIVE   Bilirubin Urine NEGATIVE NEGATIVE   Ketones, ur NEGATIVE NEGATIVE mg/dL   Protein, ur NEGATIVE NEGATIVE mg/dL   Nitrite NEGATIVE NEGATIVE   Leukocytes,Ua NEGATIVE NEGATIVE    Comment: Performed at [facility], [address] [place], [place] [address]  Lactic acid, plasma     Status: Abnormal   Collection Time: 05/15/19  4:11 PM  Result Value Ref Range   Lactic Acid, Venous 4.5 (HH) 0.5 - 1.9 mmol/L    Comment: CRITICAL RESULT CALLED TO, READ BACK BY AND VERIFIED WITH BILL SMITH 05/15/19 1731 KLW Performed at [facility], [place], [place] [address]   Troponin I (High Sensitivity)     Status: None   Collection Time: 05/15/19  4:11 PM  Result Value Ref Range   Troponin I (High Sensitivity) 7 <18 ng/L    Comment: (NOTE) Elevated high sensitivity troponin I (hsTnI) values and significant  changes across serial measurements may suggest ACS but many other  chronic and acute conditions are known to elevate hsTnI results.  Refer to the "Links" section for chest pain algorithms and additional  guidance. Performed at [facility], [place]  [address] [place], [place] [address]    Ct Chest W Contrast  Result  Date: 05/15/2019 CLINICAL DATA:  Cough, shortness of breath, abdominal pain, sore throat and diarrhea for 5 days. EXAM: CT CHEST, ABDOMEN, AND PELVIS WITH CONTRAST TECHNIQUE: Multidetector CT imaging of the chest, abdomen and pelvis was performed following the standard protocol during bolus administration of intravenous contrast. CONTRAST:  115mL OMNIPAQUE IOHEXOL 300 MG/ML  SOLN COMPARISON:  CT scan 12/09/2017 FINDINGS: CT CHEST FINDINGS Cardiovascular: The heart is within normal limits in size. No pericardial effusion. There is mild tortuosity and moderate calcification of the thoracic aorta. The branch vessels are patent. Age advanced three-vessel coronary artery calcifications. Mediastinum/Nodes: Borderline enlarged mediastinal lymph nodes, progressive since prior study. Right paratracheal node on image number 13 measures 10 mm. 11 mm precarinal lymph node on image number 21. 13 mm right-sided subcarinal lymph node on image number 29. There is a small to moderate-sized hiatal hernia along with fairly marked distal esophageal wall thickening. Findings could be due to reflux esophagitis but endoscopy may be helpful to exclude the possibility of a distal esophageal neoplasm. Lungs/Pleura: Patchy ground-glass opacity in both lungs suggesting a partial airspace filling process such as edema or hemorrhage or inflammation/alveolitis. There is significant breathing motion artifact. No pleural effusions. Possible 6 mm nodule at the right lung base. Musculoskeletal: No chest wall mass, supraclavicular or axillary lymphadenopathy. No significant bony findings. No evidence of metastatic disease. CT ABDOMEN PELVIS FINDINGS Hepatobiliary: Severe and diffuse fatty infiltration of the liver but no focal hepatic lesions or intrahepatic biliary dilatation. The gallbladder is grossly normal. No common bile duct dilatation. Pancreas: No mass, inflammation or ductal dilatation. Spleen: Normal size.  No focal lesions. Adrenals/Urinary  Tract: Mild nodularity of both adrenal glands is stable. No renal, ureteral or bladder calculi or mass. Stomach/Bowel: The stomach, duodenum, small bowel and colon are unremarkable. No acute inflammatory changes, mass lesions or obstructive findings. The terminal ileum appears normal. Suspect prior appendectomy. Vascular/Lymphatic: Age advanced atherosclerotic calcifications involving the aorta and iliac arteries. No aneurysm or dissection. The major venous structures are patent. No mesenteric or retroperitoneal mass or adenopathy. Reproductive: The prostate gland and seminal vesicles are unremarkable. Other: No pelvic mass or adenopathy. No free pelvic fluid collections. No inguinal mass or adenopathy. No abdominal wall hernia or subcutaneous lesions. Musculoskeletal: No significant bony findings. IMPRESSION: 1. Examination of the lungs is limited by significant breathing motion artifact. However, there is an underlying diffuse but patchy interstitial and ground-glass process which could be pulmonary edema or atypical pneumonia. Aspiration pneumonitis would be another possibility. 2. Possible nodule at the right lung base. Patient may need a follow-up chest CT 1 better able to hold his breath. 3. Borderline but definitely enlarged mediastinal and hilar lymph nodes when compared to the prior chest CT. 4. Moderate-sized hiatal hernia and significant lower esophageal wall thickening. This could be due to reflux esophagitis but could not exclude infiltrating neoplasm. Endoscopic evaluation may be helpful. 5. No acute abdominal/pelvic findings, mass lesions or lymphadenopathy. 6. Diffuse fatty infiltration of the liver. 7. Age advanced atherosclerotic calcifications involving the aorta and iliac arteries and coronary arteries. Electronically Signed   By: Marijo Sanes M.D.   On: 05/15/2019 16:51   Ct Abdomen Pelvis W Contrast  Result Date: 05/15/2019 CLINICAL DATA:  Cough, shortness of breath, abdominal pain, sore  throat and diarrhea for 5 days. EXAM: CT CHEST, ABDOMEN, AND PELVIS WITH CONTRAST TECHNIQUE: Multidetector CT imaging of the chest, abdomen and pelvis  was performed following the standard protocol during bolus administration of intravenous contrast. CONTRAST:  161mL OMNIPAQUE IOHEXOL 300 MG/ML  SOLN COMPARISON:  CT scan 12/09/2017 FINDINGS: CT CHEST FINDINGS Cardiovascular: The heart is within normal limits in size. No pericardial effusion. There is mild tortuosity and moderate calcification of the thoracic aorta. The branch vessels are patent. Age advanced three-vessel coronary artery calcifications. Mediastinum/Nodes: Borderline enlarged mediastinal lymph nodes, progressive since prior study. Right paratracheal node on image number 13 measures 10 mm. 11 mm precarinal lymph node on image number 21. 13 mm right-sided subcarinal lymph node on image number 29. There is a small to moderate-sized hiatal hernia along with fairly marked distal esophageal wall thickening. Findings could be due to reflux esophagitis but endoscopy may be helpful to exclude the possibility of a distal esophageal neoplasm. Lungs/Pleura: Patchy ground-glass opacity in both lungs suggesting a partial airspace filling process such as edema or hemorrhage or inflammation/alveolitis. There is significant breathing motion artifact. No pleural effusions. Possible 6 mm nodule at the right lung base. Musculoskeletal: No chest wall mass, supraclavicular or axillary lymphadenopathy. No significant bony findings. No evidence of metastatic disease. CT ABDOMEN PELVIS FINDINGS Hepatobiliary: Severe and diffuse fatty infiltration of the liver but no focal hepatic lesions or intrahepatic biliary dilatation. The gallbladder is grossly normal. No common bile duct dilatation. Pancreas: No mass, inflammation or ductal dilatation. Spleen: Normal size.  No focal lesions. Adrenals/Urinary Tract: Mild nodularity of both adrenal glands is stable. No renal, ureteral or  bladder calculi or mass. Stomach/Bowel: The stomach, duodenum, small bowel and colon are unremarkable. No acute inflammatory changes, mass lesions or obstructive findings. The terminal ileum appears normal. Suspect prior appendectomy. Vascular/Lymphatic: Age advanced atherosclerotic calcifications involving the aorta and iliac arteries. No aneurysm or dissection. The major venous structures are patent. No mesenteric or retroperitoneal mass or adenopathy. Reproductive: The prostate gland and seminal vesicles are unremarkable. Other: No pelvic mass or adenopathy. No free pelvic fluid collections. No inguinal mass or adenopathy. No abdominal wall hernia or subcutaneous lesions. Musculoskeletal: No significant bony findings. IMPRESSION: 1. Examination of the lungs is limited by significant breathing motion artifact. However, there is an underlying diffuse but patchy interstitial and ground-glass process which could be pulmonary edema or atypical pneumonia. Aspiration pneumonitis would be another possibility. 2. Possible nodule at the right lung base. Patient may need a follow-up chest CT 1 better able to hold his breath. 3. Borderline but definitely enlarged mediastinal and hilar lymph nodes when compared to the prior chest CT. 4. Moderate-sized hiatal hernia and significant lower esophageal wall thickening. This could be due to reflux esophagitis but could not exclude infiltrating neoplasm. Endoscopic evaluation may be helpful. 5. No acute abdominal/pelvic findings, mass lesions or lymphadenopathy. 6. Diffuse fatty infiltration of the liver. 7. Age advanced atherosclerotic calcifications involving the aorta and iliac arteries and coronary arteries. Electronically Signed   By: Marijo Sanes M.D.   On: 05/15/2019 16:51   Dg Chest Port 1 View  Result Date: 05/15/2019 CLINICAL DATA:  Cough and short of breath.  History of lung cancer EXAM: PORTABLE CHEST 1 VIEW COMPARISON:  04/24/2019 FINDINGS: Improved aeration of the  lungs bilaterally specially on the right. Mild residual airspace disease in the right lung base. Negative for heart failure or effusion. Heart size upper normal. IMPRESSION: Improved aeration of the lungs. Near complete clearing of airspace disease in the right lung. Electronically Signed   By: Franchot Gallo M.D.   On: 05/15/2019 13:53    Pending  Labs [employer] (From admission, onward)    Start     Ordered   05/15/19 1848  Gastrointestinal Panel by PCR , Stool  (Gastrointestinal Panel by PCR, Stool)  Once,   STAT     05/15/19 1847   05/15/19 1848  MRSA PCR Screening  Once,   STAT     05/15/19 1848   05/15/19 1847  Legionella Pneumophila Serogp 1 Ur Ag  ONCE - STAT,   STAT    Comments: Very important collect ASAP !!!    05/15/19 1846   05/15/19 1845  Strep pneumoniae urinary antigen  Once,   STAT    Comments: Important must collect ASAP !!!    05/15/19 1846   05/15/19 1841  C difficile quick scan w PCR reflex  (C Difficile quick screen w PCR reflex panel)  Once, for 24 hours,   STAT     05/15/19 1843   05/15/19 1328  Blood Culture (routine x 2)  BLOOD CULTURE X 2,   STAT     05/15/19 1329   05/15/19 1328  Urine culture  ONCE - STAT,   STAT     05/15/19 1329          Vitals/Pain Today's Vitals   05/15/19 1900 05/15/19 1915 05/15/19 1930 05/15/19 2015  BP: (!) 139/96 (!) 143/100 (!) 140/108 (!) 139/100  Pulse: (!) 144 (!) 141 (!) 142 (!) 137  Resp: (!) 25 (!) 28 (!) 25 (!) 25  Temp:      TempSrc:      SpO2: 97% 97% 97% 97%  Weight:      PainSc:        Isolation Precautions Enteric precautions (UV disinfection)  Medications Medications  ipratropium-albuterol (DUONEB) 0.5-2.5 (3) MG/3ML nebulizer solution 3 mL (3 mLs Nebulization Given 05/15/19 1419)  iohexol (OMNIPAQUE) 240 MG/ML injection 25 mL (0 mLs Oral Hold 05/15/19 1859)  cloNIDine (CATAPRES) tablet 0.1 mg (has no administration in time range)  pantoprazole (PROTONIX) EC tablet 40 mg (has no administration in  time range)  folic acid (FOLVITE) tablet 1 mg (has no administration in time range)  traZODone (DESYREL) tablet 50 mg (has no administration in time range)  QUEtiapine (SEROQUEL) tablet 25 mg (has no administration in time range)  multivitamin with minerals tablet 1 tablet (has no administration in time range)  niacin tablet 500 mg (has no administration in time range)  thiamine (VITAMIN B-1) tablet 100 mg (has no administration in time range)  enoxaparin (LOVENOX) injection 40 mg (has no administration in time range)  lactated ringers infusion (has no administration in time range)  ceFEPIme (MAXIPIME) 2 g in sodium chloride 0.9 % 100 mL IVPB (has no administration in time range)  lactated ringers bolus 1,000 mL (0 mLs Intravenous Stopped 05/15/19 1455)  ondansetron (ZOFRAN) injection 4 mg (4 mg Intravenous Given 05/15/19 1359)  morphine 4 MG/ML injection 4 mg (4 mg Intravenous Given 05/15/19 1359)  ceFEPIme (MAXIPIME) 2 g in sodium chloride 0.9 % 100 mL IVPB (0 g Intravenous Stopped 05/15/19 1529)  metroNIDAZOLE (FLAGYL) IVPB 500 mg (0 mg Intravenous Stopped 05/15/19 1616)  vancomycin (VANCOCIN) IVPB 1000 mg/200 mL premix (0 mg Intravenous Stopped 05/15/19 1826)  vancomycin (VANCOCIN) IVPB 1000 mg/200 mL premix (0 mg Intravenous Stopped 05/15/19 1930)  sodium chloride 0.9 % bolus 1,000 mL (0 mLs Intravenous Stopped 05/15/19 1930)  iohexol (OMNIPAQUE) 300 MG/ML solution 100 mL (100 mLs Intravenous Contrast Given 05/15/19 1626)  morphine 4 MG/ML injection 4 mg (4 mg  Intravenous Given 05/15/19 1615)  sodium chloride 0.9 % bolus 1,000 mL (1,000 mLs Intravenous New Bag/Given 05/15/19 1930)    Mobility walks Low fall risk   Focused Assessments Pulmonary Assessment Handoff:  Lung sounds: L Breath Sounds: Diminished R Breath Sounds: Diminished O2 Device: Room Air     ,    R Recommendations: See Admitting Provider Note  Report given to:   Additional Notes:

## 2019-05-16 ENCOUNTER — Inpatient Hospital Stay: Payer: Self-pay

## 2019-05-16 LAB — CBC
HCT: 27.4 % — ABNORMAL LOW (ref 39.0–52.0)
Hemoglobin: 8 g/dL — ABNORMAL LOW (ref 13.0–17.0)
MCH: 23.9 pg — ABNORMAL LOW (ref 26.0–34.0)
MCHC: 29.2 g/dL — ABNORMAL LOW (ref 30.0–36.0)
MCV: 81.8 fL (ref 80.0–100.0)
Platelets: 224 10*3/uL (ref 150–400)
RBC: 3.35 MIL/uL — ABNORMAL LOW (ref 4.22–5.81)
RDW: 20 % — ABNORMAL HIGH (ref 11.5–15.5)
WBC: 14.7 10*3/uL — ABNORMAL HIGH (ref 4.0–10.5)
nRBC: 0 % (ref 0.0–0.2)

## 2019-05-16 LAB — GASTROINTESTINAL PANEL BY PCR, STOOL (REPLACES STOOL CULTURE)

## 2019-05-16 LAB — BASIC METABOLIC PANEL
Anion gap: 8 (ref 5–15)
BUN: 5 mg/dL — ABNORMAL LOW (ref 8–23)
CO2: 26 mmol/L (ref 22–32)
Calcium: 7.4 mg/dL — ABNORMAL LOW (ref 8.9–10.3)
Chloride: 100 mmol/L (ref 98–111)
Creatinine, Ser: 0.46 mg/dL — ABNORMAL LOW (ref 0.61–1.24)
GFR calc Af Amer: >60 mL/min (ref >60)
GFR calc non Af Amer: >60 mL/min (ref >60)
Glucose, Bld: 129 mg/dL — ABNORMAL HIGH (ref 70–99)
Potassium: 3.8 mmol/L (ref 3.5–5.1)
Sodium: 134 mmol/L — ABNORMAL LOW (ref 135–145)

## 2019-05-16 LAB — LACTIC ACID, PLASMA: Lactic Acid, Venous: 1.8 mmol/L (ref 0.5–1.9)

## 2019-05-16 LAB — URINE CULTURE: Culture: NO GROWTH

## 2019-05-16 LAB — C DIFFICILE QUICK SCREEN W PCR REFLEX
C Diff antigen: NEGATIVE
C Diff interpretation: NOT DETECTED
C Diff toxin: NEGATIVE

## 2019-05-16 LAB — PROCALCITONIN: Procalcitonin: 0.29 ng/mL

## 2019-05-16 MED ORDER — PNEUMOCOCCAL VAC POLYVALENT 25 MCG/0.5ML IJ INJ
0.5000 mL | INJECTION | INTRAMUSCULAR | Status: AC
  Administered 2019-05-17: 0.5 mL via INTRAMUSCULAR
  Filled 2019-05-16: qty 0.5

## 2019-05-16 MED ORDER — SODIUM CHLORIDE 0.9 % IV SOLN
0.0000 ug/min | INTRAVENOUS | Status: DC
  Filled 2019-05-16: qty 1

## 2019-05-16 MED ORDER — MORPHINE SULFATE (PF) 2 MG/ML IV SOLN
INTRAVENOUS | Status: AC
  Administered 2019-05-16: 2 mg via INTRAVENOUS
  Filled 2019-05-16: qty 1

## 2019-05-16 MED ORDER — MORPHINE SULFATE (PF) 2 MG/ML IV SOLN
1.0000 mg | INTRAVENOUS | Status: DC | PRN
  Administered 2019-05-16 – 2019-05-19 (×3): 2 mg via INTRAVENOUS
  Filled 2019-05-16 (×2): qty 1

## 2019-05-16 NOTE — Progress Notes (Signed)
[facility] NAME: George Wade    MR#:  [id number]  DATE OF BIRTH:  August 08, 1956  SUBJECTIVE: Patient is admitted for shortness of breath and found to have pneumonia and admitted to ICU.  CHIEF COMPLAINT:   Chief Complaint  Patient presents with  . Shortness of Breath  Says he feels little better but still short of breath, tired.  Had diarrhea at home but no diarrhea now.  Has been feeling weak for a long time.  REVIEW OF SYSTEMS:   ROS CONSTITUTIONAL: No fever, fatigue or weakness.  EYES: No blurred or double vision.  EARS, NOSE, AND THROAT: No tinnitus or ear pain.  RESPIRATORY: Shortness of breath, generalized weakness. CARDIOVASCULAR: No chest pain, orthopnea, edema.  GASTROINTESTINAL:  abdominal pain. GENITOURINARY: No dysuria, hematuria.  ENDOCRINE: No polyuria, nocturia,  HEMATOLOGY: No anemia, easy bruising or bleeding SKIN: No rash or lesion. MUSCULOSKELETAL: No joint pain or arthritis.   NEUROLOGIC: No tingling, numbness, weakness.  PSYCHIATRY: No anxiety or depression.   DRUG ALLERGIES:   Allergies  Allergen Reactions  . Sulfa Antibiotics Nausea Only    VITALS:  Blood pressure 102/84, pulse (!) 103, temperature 98.4 F (36.9 C), temperature source Axillary, resp. rate 16, height 5\' 9"  (1.753 m), weight 77.4 kg, SpO2 97 %.  PHYSICAL EXAMINATION:  GENERAL:  63 y.o.-year-old patient lying in the bed with no acute distress.  EYES: Pupils equal, round, reactive to light . No scleral icterus. Extraocular muscles intact.  HEENT: Head atraumatic, normocephalic. Oropharynx and nasopharynx clear.  NECK:  Supple, no jugular venous distention. No thyroid enlargement, no tenderness.  LUNGS: Niminished air entry bilaterally. CARDIOVASCULAR: S1, S2 normal. No murmurs, rubs, or gallops.  ABDOMEN: Soft, nontender, nondistended. Bowel sounds present. No organomegaly or mass.  EXTREMITIES: No pedal edema, cyanosis,  or clubbing.  NEUROLOGIC: Cranial nerves II through XII are intact. Muscle strength 5/5 in all extremities. Sensation intact. Gait not checked.  PSYCHIATRIC: The patient is alert and oriented x 3.  SKIN: No obvious rash, lesion, or ulcer.    LABORATORY PANEL:   CBC Recent Labs  Lab 05/16/19 0049  WBC 14.7*  HGB 8.0*  HCT 27.4*  PLT 224   ------------------------------------------------------------------------------------------------------------------  Chemistries  Recent Labs  Lab 05/15/19 1405 05/16/19 0049  NA 136 134*  K 3.8 3.8  CL 93* 100  CO2 27 26  GLUCOSE 112* 129*  BUN 6* 5*  CREATININE 0.47* 0.46*  CALCIUM 9.7 7.4*  AST 33  --   ALT 31  --   ALKPHOS 157*  --   BILITOT 0.5  --    ------------------------------------------------------------------------------------------------------------------  Cardiac Enzymes No results for input(s): TROPONINI in the last 168 hours. ------------------------------------------------------------------------------------------------------------------  RADIOLOGY:  Ct Chest W Contrast  Result Date: 05/15/2019 CLINICAL DATA:  Cough, shortness of breath, abdominal pain, sore throat and diarrhea for 5 days. EXAM: CT CHEST, ABDOMEN, AND PELVIS WITH CONTRAST TECHNIQUE: Multidetector CT imaging of the chest, abdomen and pelvis was performed following the standard protocol during bolus administration of intravenous contrast. CONTRAST:  162mL OMNIPAQUE IOHEXOL 300 MG/ML  SOLN COMPARISON:  CT scan 12/09/2017 FINDINGS: CT CHEST FINDINGS Cardiovascular: The heart is within normal limits in size. No pericardial effusion. There is mild tortuosity and moderate calcification of the thoracic aorta. The branch vessels are patent. Age advanced three-vessel coronary artery calcifications. Mediastinum/Nodes: Borderline enlarged mediastinal lymph nodes, progressive since prior study. Right paratracheal node on image number 13 measures 10 mm. 11  mm  precarinal lymph node on image number 21. 13 mm right-sided subcarinal lymph node on image number 29. There is a small to moderate-sized hiatal hernia along with fairly marked distal esophageal wall thickening. Findings could be due to reflux esophagitis but endoscopy may be helpful to exclude the possibility of a distal esophageal neoplasm. Lungs/Pleura: Patchy ground-glass opacity in both lungs suggesting a partial airspace filling process such as edema or hemorrhage or inflammation/alveolitis. There is significant breathing motion artifact. No pleural effusions. Possible 6 mm nodule at the right lung base. Musculoskeletal: No chest wall mass, supraclavicular or axillary lymphadenopathy. No significant bony findings. No evidence of metastatic disease. CT ABDOMEN PELVIS FINDINGS Hepatobiliary: Severe and diffuse fatty infiltration of the liver but no focal hepatic lesions or intrahepatic biliary dilatation. The gallbladder is grossly normal. No common bile duct dilatation. Pancreas: No mass, inflammation or ductal dilatation. Spleen: Normal size.  No focal lesions. Adrenals/Urinary Tract: Mild nodularity of both adrenal glands is stable. No renal, ureteral or bladder calculi or mass. Stomach/Bowel: The stomach, duodenum, small bowel and colon are unremarkable. No acute inflammatory changes, mass lesions or obstructive findings. The terminal ileum appears normal. Suspect prior appendectomy. Vascular/Lymphatic: Age advanced atherosclerotic calcifications involving the aorta and iliac arteries. No aneurysm or dissection. The major venous structures are patent. No mesenteric or retroperitoneal mass or adenopathy. Reproductive: The prostate gland and seminal vesicles are unremarkable. Other: No pelvic mass or adenopathy. No free pelvic fluid collections. No inguinal mass or adenopathy. No abdominal wall hernia or subcutaneous lesions. Musculoskeletal: No significant bony findings. IMPRESSION: 1. Examination of the lungs  is limited by significant breathing motion artifact. However, there is an underlying diffuse but patchy interstitial and ground-glass process which could be pulmonary edema or atypical pneumonia. Aspiration pneumonitis would be another possibility. 2. Possible nodule at the right lung base. Patient may need a follow-up chest CT 1 better able to hold his breath. 3. Borderline but definitely enlarged mediastinal and hilar lymph nodes when compared to the prior chest CT. 4. Moderate-sized hiatal hernia and significant lower esophageal wall thickening. This could be due to reflux esophagitis but could not exclude infiltrating neoplasm. Endoscopic evaluation may be helpful. 5. No acute abdominal/pelvic findings, mass lesions or lymphadenopathy. 6. Diffuse fatty infiltration of the liver. 7. Age advanced atherosclerotic calcifications involving the aorta and iliac arteries and coronary arteries. Electronically Signed   By: Marijo Sanes M.D.   On: 05/15/2019 16:51   Ct Abdomen Pelvis W Contrast  Result Date: 05/15/2019 CLINICAL DATA:  Cough, shortness of breath, abdominal pain, sore throat and diarrhea for 5 days. EXAM: CT CHEST, ABDOMEN, AND PELVIS WITH CONTRAST TECHNIQUE: Multidetector CT imaging of the chest, abdomen and pelvis was performed following the standard protocol during bolus administration of intravenous contrast. CONTRAST:  136mL OMNIPAQUE IOHEXOL 300 MG/ML  SOLN COMPARISON:  CT scan 12/09/2017 FINDINGS: CT CHEST FINDINGS Cardiovascular: The heart is within normal limits in size. No pericardial effusion. There is mild tortuosity and moderate calcification of the thoracic aorta. The branch vessels are patent. Age advanced three-vessel coronary artery calcifications. Mediastinum/Nodes: Borderline enlarged mediastinal lymph nodes, progressive since prior study. Right paratracheal node on image number 13 measures 10 mm. 11 mm precarinal lymph node on image number 21. 13 mm right-sided subcarinal lymph node  on image number 29. There is a small to moderate-sized hiatal hernia along with fairly marked distal esophageal wall thickening. Findings could be due to reflux esophagitis but endoscopy may be helpful to exclude the possibility  of a distal esophageal neoplasm. Lungs/Pleura: Patchy ground-glass opacity in both lungs suggesting a partial airspace filling process such as edema or hemorrhage or inflammation/alveolitis. There is significant breathing motion artifact. No pleural effusions. Possible 6 mm nodule at the right lung base. Musculoskeletal: No chest wall mass, supraclavicular or axillary lymphadenopathy. No significant bony findings. No evidence of metastatic disease. CT ABDOMEN PELVIS FINDINGS Hepatobiliary: Severe and diffuse fatty infiltration of the liver but no focal hepatic lesions or intrahepatic biliary dilatation. The gallbladder is grossly normal. No common bile duct dilatation. Pancreas: No mass, inflammation or ductal dilatation. Spleen: Normal size.  No focal lesions. Adrenals/Urinary Tract: Mild nodularity of both adrenal glands is stable. No renal, ureteral or bladder calculi or mass. Stomach/Bowel: The stomach, duodenum, small bowel and colon are unremarkable. No acute inflammatory changes, mass lesions or obstructive findings. The terminal ileum appears normal. Suspect prior appendectomy. Vascular/Lymphatic: Age advanced atherosclerotic calcifications involving the aorta and iliac arteries. No aneurysm or dissection. The major venous structures are patent. No mesenteric or retroperitoneal mass or adenopathy. Reproductive: The prostate gland and seminal vesicles are unremarkable. Other: No pelvic mass or adenopathy. No free pelvic fluid collections. No inguinal mass or adenopathy. No abdominal wall hernia or subcutaneous lesions. Musculoskeletal: No significant bony findings. IMPRESSION: 1. Examination of the lungs is limited by significant breathing motion artifact. However, there is an  underlying diffuse but patchy interstitial and ground-glass process which could be pulmonary edema or atypical pneumonia. Aspiration pneumonitis would be another possibility. 2. Possible nodule at the right lung base. Patient may need a follow-up chest CT 1 better able to hold his breath. 3. Borderline but definitely enlarged mediastinal and hilar lymph nodes when compared to the prior chest CT. 4. Moderate-sized hiatal hernia and significant lower esophageal wall thickening. This could be due to reflux esophagitis but could not exclude infiltrating neoplasm. Endoscopic evaluation may be helpful. 5. No acute abdominal/pelvic findings, mass lesions or lymphadenopathy. 6. Diffuse fatty infiltration of the liver. 7. Age advanced atherosclerotic calcifications involving the aorta and iliac arteries and coronary arteries. Electronically Signed   By: Marijo Sanes M.D.   On: 05/15/2019 16:51   Dg Chest Port 1 View  Result Date: 05/15/2019 CLINICAL DATA:  Cough and short of breath.  History of lung cancer EXAM: PORTABLE CHEST 1 VIEW COMPARISON:  04/24/2019 FINDINGS: Improved aeration of the lungs bilaterally specially on the right. Mild residual airspace disease in the right lung base. Negative for heart failure or effusion. Heart size upper normal. IMPRESSION: Improved aeration of the lungs. Near complete clearing of airspace disease in the right lung. Electronically Signed   By: Franchot Gallo M.D.   On: 05/15/2019 13:53    EKG:   Orders placed or performed during the hospital encounter of 05/15/19  . ED EKG 12-Lead  . ED EKG 12-Lead  . EKG 12-Lead  . EKG 12-Lead    ASSESSMENT AND PLAN:   #1 acute respiratory failure with hypoxia secondary to pneumonia, patient has history of recurrent aspiration pneumonia, off oxygen, hemodynamically stable, continue cefepime. 2.  Sepsis secondary to pneumonia, improving, continue IV antibiotics, patient not needing any pressors.  Hopefully we can move him out of ICU  today.  Follow blood cultures.  CT chest showed right base pneumonia patient needs as per CT in 1 month to follow-up on pneumonia and also lymphadenopathy.   3.  Alcohol abuse, patient is on alcohol withdrawal protocol with Ativan.  Continue thiamine, folic acid. 4.  Abdominal pain, diarrhea,  patient CT abdomen showed reflux esophagitis and possible infiltrating neoplasm, patient needs GI consult, continue Protonix.  5/ patient is on enteric precautions because of diarrhea, stool C. difficile ordered but patient has no further diarrhea. All the records are reviewed and case discussed with Care Management/Social Workerr. Management plans discussed with the patient, family and they are in agreement.  CODE STATUS: Full code  TOTAL TIME TAKING CARE OF THIS PATIENT: 38 minutes.  More than 50% time spent in counseling, coordination of care POSSIBLE D/C IN 2-3 DAYS, DEPENDING ON CLINICAL CONDITION.   Epifanio Lesches M.D on 05/16/2019 at 11:10 AM  Between 7am to 6pm - Pager - [phone]  After 6pm go to [email or web address] - password [email or web address] [facility]  Office  [phone]  CC: Primary care physician; Patient, [name] Per   Note: This dictation was prepared with Dragon dictation along with smaller phrase technology. Any transcriptional errors that result from this process are unintentional.

## 2019-05-16 NOTE — Progress Notes (Signed)
Pt transferred to Room 217 with belongings to include laptop and phone.

## 2019-05-17 LAB — LEGIONELLA PNEUMOPHILA SEROGP 1 UR AG: L. pneumophila Serogp 1 Ur Ag: NEGATIVE

## 2019-05-17 LAB — CBC
HCT: 27.1 % — ABNORMAL LOW (ref 39.0–52.0)
Hemoglobin: 7.7 g/dL — ABNORMAL LOW (ref 13.0–17.0)
MCH: 23.7 pg — ABNORMAL LOW (ref 26.0–34.0)
MCHC: 28.4 g/dL — ABNORMAL LOW (ref 30.0–36.0)
MCV: 83.4 fL (ref 80.0–100.0)
Platelets: 208 10*3/uL (ref 150–400)
RBC: 3.25 MIL/uL — ABNORMAL LOW (ref 4.22–5.81)
RDW: 19.6 % — ABNORMAL HIGH (ref 11.5–15.5)
WBC: 5.4 10*3/uL (ref 4.0–10.5)
nRBC: 0 % (ref 0.0–0.2)

## 2019-05-17 LAB — PROCALCITONIN: Procalcitonin: 0.16 ng/mL

## 2019-05-17 MED ORDER — PANTOPRAZOLE SODIUM 40 MG PO TBEC
40.0000 mg | DELAYED_RELEASE_TABLET | Freq: Two times a day (BID) | ORAL | Status: DC
  Administered 2019-05-17 – 2019-05-19 (×3): 40 mg via ORAL
  Filled 2019-05-17 (×3): qty 1

## 2019-05-17 MED ORDER — NEPRO/CARBSTEADY PO LIQD
237.0000 mL | Freq: Two times a day (BID) | ORAL | Status: DC
  Administered 2019-05-17 – 2019-05-19 (×3): 237 mL via ORAL

## 2019-05-17 NOTE — Progress Notes (Signed)
Speech Therapy note: this SLP was contacted by the [facility] at lunch meal today d/t pt's c/o not being able to "manage eating" a "hamburger" to which he then requested it be prepared "medium-rare for easier eating". Dining Services explained they could not prepare meat this way but suggested meatloaf to which he agreed.  Pt does have a h/o Neck Cancer and was on a modified diet last admission. This SLP contacted the Dietician to address modifying tougher meats in pt's diet(cutting meats); she agreed. Pt also has a current presentation of "moderate size hiatal hernia with significant lower esophageal wall thickening" per chart notes. Modifying solids/meats in his GI diet could aid Esophageal motility. This was addressed previous admission also.  ST services can be available for any further assessment if warranted per MD. NSG updated, agreed.     George Wade, [place], CCC-SLP

## 2019-05-17 NOTE — Progress Notes (Signed)
Order received from Dr Vianne Bulls to discontinue telemetry

## 2019-05-17 NOTE — Progress Notes (Signed)
[facility] [place] NAME: George Wade    MR#:  [id number]  DATE OF BIRTH:  [birth year]  SUBJECTIVE: Patient is admitted for shortness of breath and found to have pneumonia and admitted to ICU.  Downgraded to medical floor yesterday, patient is not on oxygen, shortness of breath improved, denies abdominal pain.  Says that telemetry wires are bothering and not able to sleep last night.  He says he is homeless and requesting to talk to Education officer, museum.  CHIEF COMPLAINT:   Chief Complaint  Patient presents with  . Shortness of Breath  Heavy alcohol abuse, last drink was 3 days ago,  REVIEW OF SYSTEMS:   ROS CONSTITUTIONAL: No fever, fatigue or weakness.  EYES: No blurred or double vision.  EARS, NOSE, AND THROAT: No tinnitus or ear pain.  RESPIRATORY: Shortness of breath improved.  And cARDIOVASCULAR: No chest pain, orthopnea, edema.  GASTROINTESTINAL: No abdominal pain today  GENITOURINARY: No dysuria, hematuria.  ENDOCRINE: No polyuria, nocturia,  HEMATOLOGY: No anemia, easy bruising or bleeding SKIN: No rash or lesion. MUSCULOSKELETAL: No joint pain or arthritis.   NEUROLOGIC: No tingling, numbness, weakness.  PSYCHIATRY: No anxiety or depression.   DRUG ALLERGIES:   Allergies  Allergen Reactions  . Sulfa Antibiotics Nausea Only    VITALS:  Blood pressure 102/72, pulse (!) 109, temperature 98.4 F (36.9 C), temperature source Oral, resp. rate 16, height 5\' 8"  (1.727 m), weight 76.6 kg, SpO2 97 %.  PHYSICAL EXAMINATION:  GENERAL:  63 y.o.-year-old patient lying in the bed with no acute distress.  EYES: Pupils equal, round, reactive to light . No scleral icterus. Extraocular muscles intact.  HEENT: Head atraumatic, normocephalic. Oropharynx and nasopharynx clear.  NECK:  Supple, no jugular venous distention. No thyroid enlargement, no tenderness.  LUNGS: Niminished air entry bilaterally. CARDIOVASCULAR: S1, S2 normal. No  murmurs, rubs, or gallops.  ABDOMEN: Soft, nontender, nondistended. Bowel sounds present. No organomegaly or mass.  EXTREMITIES: No pedal edema, cyanosis, or clubbing.  NEUROLOGIC: Cranial nerves II through XII are intact. Muscle strength 5/5 in all extremities. Sensation intact. Gait not checked.  PSYCHIATRIC: The patient is alert and oriented x 3.  SKIN: No obvious rash, lesion, or ulcer.    LABORATORY PANEL:   CBC Recent Labs  Lab 05/17/19 0557  WBC 5.4  HGB 7.7*  HCT 27.1*  PLT 208   ------------------------------------------------------------------------------------------------------------------  Chemistries  Recent Labs  Lab 05/15/19 1405 05/16/19 0049  NA 136 134*  K 3.8 3.8  CL 93* 100  CO2 27 26  GLUCOSE 112* 129*  BUN 6* 5*  CREATININE 0.47* 0.46*  CALCIUM 9.7 7.4*  AST 33  --   ALT 31  --   ALKPHOS 157*  --   BILITOT 0.5  --    ------------------------------------------------------------------------------------------------------------------  Cardiac Enzymes No results for input(s): TROPONINI in the last 168 hours. ------------------------------------------------------------------------------------------------------------------  RADIOLOGY:  Ct Chest W Contrast  Result Date: 05/15/2019 CLINICAL DATA:  Cough, shortness of breath, abdominal pain, sore throat and diarrhea for 5 days. EXAM: CT CHEST, ABDOMEN, AND PELVIS WITH CONTRAST TECHNIQUE: Multidetector CT imaging of the chest, abdomen and pelvis was performed following the standard protocol during bolus administration of intravenous contrast. CONTRAST:  189mL OMNIPAQUE IOHEXOL 300 MG/ML  SOLN COMPARISON:  CT scan 12/09/2017 FINDINGS: CT CHEST FINDINGS Cardiovascular: The heart is within normal limits in size. No pericardial effusion. There is mild tortuosity and moderate calcification of the thoracic aorta. The branch vessels are  patent. Age advanced three-vessel coronary artery calcifications.  Mediastinum/Nodes: Borderline enlarged mediastinal lymph nodes, progressive since prior study. Right paratracheal node on image number 13 measures 10 mm. 11 mm precarinal lymph node on image number 21. 13 mm right-sided subcarinal lymph node on image number 29. There is a small to moderate-sized hiatal hernia along with fairly marked distal esophageal wall thickening. Findings could be due to reflux esophagitis but endoscopy may be helpful to exclude the possibility of a distal esophageal neoplasm. Lungs/Pleura: Patchy ground-glass opacity in both lungs suggesting a partial airspace filling process such as edema or hemorrhage or inflammation/alveolitis. There is significant breathing motion artifact. No pleural effusions. Possible 6 mm nodule at the right lung base. Musculoskeletal: No chest wall mass, supraclavicular or axillary lymphadenopathy. No significant bony findings. No evidence of metastatic disease. CT ABDOMEN PELVIS FINDINGS Hepatobiliary: Severe and diffuse fatty infiltration of the liver but no focal hepatic lesions or intrahepatic biliary dilatation. The gallbladder is grossly normal. No common bile duct dilatation. Pancreas: No mass, inflammation or ductal dilatation. Spleen: Normal size.  No focal lesions. Adrenals/Urinary Tract: Mild nodularity of both adrenal glands is stable. No renal, ureteral or bladder calculi or mass. Stomach/Bowel: The stomach, duodenum, small bowel and colon are unremarkable. No acute inflammatory changes, mass lesions or obstructive findings. The terminal ileum appears normal. Suspect prior appendectomy. Vascular/Lymphatic: Age advanced atherosclerotic calcifications involving the aorta and iliac arteries. No aneurysm or dissection. The major venous structures are patent. No mesenteric or retroperitoneal mass or adenopathy. Reproductive: The prostate gland and seminal vesicles are unremarkable. Other: No pelvic mass or adenopathy. No free pelvic fluid collections. No  inguinal mass or adenopathy. No abdominal wall hernia or subcutaneous lesions. Musculoskeletal: No significant bony findings. IMPRESSION: 1. Examination of the lungs is limited by significant breathing motion artifact. However, there is an underlying diffuse but patchy interstitial and ground-glass process which could be pulmonary edema or atypical pneumonia. Aspiration pneumonitis would be another possibility. 2. Possible nodule at the right lung base. Patient may need a follow-up chest CT 1 better able to hold his breath. 3. Borderline but definitely enlarged mediastinal and hilar lymph nodes when compared to the prior chest CT. 4. Moderate-sized hiatal hernia and significant lower esophageal wall thickening. This could be due to reflux esophagitis but could not exclude infiltrating neoplasm. Endoscopic evaluation may be helpful. 5. No acute abdominal/pelvic findings, mass lesions or lymphadenopathy. 6. Diffuse fatty infiltration of the liver. 7. Age advanced atherosclerotic calcifications involving the aorta and iliac arteries and coronary arteries. Electronically Signed   By: Marijo Sanes M.D.   On: 05/15/2019 16:51   Ct Abdomen Pelvis W Contrast  Result Date: 05/15/2019 CLINICAL DATA:  Cough, shortness of breath, abdominal pain, sore throat and diarrhea for 5 days. EXAM: CT CHEST, ABDOMEN, AND PELVIS WITH CONTRAST TECHNIQUE: Multidetector CT imaging of the chest, abdomen and pelvis was performed following the standard protocol during bolus administration of intravenous contrast. CONTRAST:  145mL OMNIPAQUE IOHEXOL 300 MG/ML  SOLN COMPARISON:  CT scan 12/09/2017 FINDINGS: CT CHEST FINDINGS Cardiovascular: The heart is within normal limits in size. No pericardial effusion. There is mild tortuosity and moderate calcification of the thoracic aorta. The branch vessels are patent. Age advanced three-vessel coronary artery calcifications. Mediastinum/Nodes: Borderline enlarged mediastinal lymph nodes, progressive  since prior study. Right paratracheal node on image number 13 measures 10 mm. 11 mm precarinal lymph node on image number 21. 13 mm right-sided subcarinal lymph node on image number 29. There is a small  to moderate-sized hiatal hernia along with fairly marked distal esophageal wall thickening. Findings could be due to reflux esophagitis but endoscopy may be helpful to exclude the possibility of a distal esophageal neoplasm. Lungs/Pleura: Patchy ground-glass opacity in both lungs suggesting a partial airspace filling process such as edema or hemorrhage or inflammation/alveolitis. There is significant breathing motion artifact. No pleural effusions. Possible 6 mm nodule at the right lung base. Musculoskeletal: No chest wall mass, supraclavicular or axillary lymphadenopathy. No significant bony findings. No evidence of metastatic disease. CT ABDOMEN PELVIS FINDINGS Hepatobiliary: Severe and diffuse fatty infiltration of the liver but no focal hepatic lesions or intrahepatic biliary dilatation. The gallbladder is grossly normal. No common bile duct dilatation. Pancreas: No mass, inflammation or ductal dilatation. Spleen: Normal size.  No focal lesions. Adrenals/Urinary Tract: Mild nodularity of both adrenal glands is stable. No renal, ureteral or bladder calculi or mass. Stomach/Bowel: The stomach, duodenum, small bowel and colon are unremarkable. No acute inflammatory changes, mass lesions or obstructive findings. The terminal ileum appears normal. Suspect prior appendectomy. Vascular/Lymphatic: Age advanced atherosclerotic calcifications involving the aorta and iliac arteries. No aneurysm or dissection. The major venous structures are patent. No mesenteric or retroperitoneal mass or adenopathy. Reproductive: The prostate gland and seminal vesicles are unremarkable. Other: No pelvic mass or adenopathy. No free pelvic fluid collections. No inguinal mass or adenopathy. No abdominal wall hernia or subcutaneous lesions.  Musculoskeletal: No significant bony findings. IMPRESSION: 1. Examination of the lungs is limited by significant breathing motion artifact. However, there is an underlying diffuse but patchy interstitial and ground-glass process which could be pulmonary edema or atypical pneumonia. Aspiration pneumonitis would be another possibility. 2. Possible nodule at the right lung base. Patient may need a follow-up chest CT 1 better able to hold his breath. 3. Borderline but definitely enlarged mediastinal and hilar lymph nodes when compared to the prior chest CT. 4. Moderate-sized hiatal hernia and significant lower esophageal wall thickening. This could be due to reflux esophagitis but could not exclude infiltrating neoplasm. Endoscopic evaluation may be helpful. 5. No acute abdominal/pelvic findings, mass lesions or lymphadenopathy. 6. Diffuse fatty infiltration of the liver. 7. Age advanced atherosclerotic calcifications involving the aorta and iliac arteries and coronary arteries. Electronically Signed   By: Marijo Sanes M.D.   On: 05/15/2019 16:51   US Venous Img Lower Unilateral Right  Result Date: 05/16/2019 CLINICAL DATA:  Right lower extremity edema. History of lymphoma. Evaluate for DVT. EXAM: RIGHT LOWER EXTREMITY VENOUS DOPPLER ULTRASOUND TECHNIQUE: Gray-scale sonography with graded compression, as well as color Doppler and duplex ultrasound were performed to evaluate the lower extremity deep venous systems from the level of the common femoral vein and including the common femoral, femoral, profunda femoral, popliteal and calf veins including the posterior tibial, peroneal and gastrocnemius veins when visible. The superficial great saphenous vein was also interrogated. Spectral Doppler was utilized to evaluate flow at rest and with distal augmentation maneuvers in the common femoral, femoral and popliteal veins. COMPARISON:  None. FINDINGS: Contralateral Common Femoral Vein: Respiratory phasicity is normal and  symmetric with the symptomatic side. No evidence of thrombus. Normal compressibility. Common Femoral Vein: No evidence of thrombus. Normal compressibility, respiratory phasicity and response to augmentation. Saphenofemoral Junction: No evidence of thrombus. Normal compressibility and flow on color Doppler imaging. Profunda Femoral Vein: No evidence of thrombus. Normal compressibility and flow on color Doppler imaging. Femoral Vein: No evidence of thrombus. Normal compressibility, respiratory phasicity and response to augmentation. Popliteal Vein: No evidence of  thrombus. Normal compressibility, respiratory phasicity and response to augmentation. Calf Veins: No evidence of thrombus. Normal compressibility and flow on color Doppler imaging. Superficial Great Saphenous Vein: No evidence of thrombus. Normal compressibility. Venous Reflux:  None. Other Findings:  None. IMPRESSION: No evidence of DVT within the right lower extremity. Electronically Signed   By: Sandi Mariscal M.D.   On: 05/16/2019 12:34    EKG:   Orders placed or performed during the hospital encounter of 05/15/19  . ED EKG 12-Lead  . ED EKG 12-Lead  . EKG 12-Lead  . EKG 12-Lead    ASSESSMENT AND PLAN:   #1. acute respiratory failure with hypoxia secondary to pneumonia, patient has history of recurrent aspiration pneumonia, off oxygen, hemodynamically stable, continue cefepime.   2.  Sepsis secondary to pneumonia, improving, continue IV antibiotics, patient not needing any pressors.  Transferred out of ICU. y.  Blood cultures are negative .  CT chest showed right base pneumonia patient needs as per CT in 1 month to follow-up on pneumonia and also lymphadenopathy.   3.  Alcohol abuse, patient is on alcohol withdrawal protocol with Ativan.  Continue thiamine, folic acid. Patient is homeless, requesting to palpable social worker regarding disposition.  4.  Abdominal pain, diarrhea, patient CT abdomen showed reflux esophagitis and possible  infiltrating neoplasm, patient needs GI consult, continue Protonix.  5/diarrhea, resolved,cdiff negative D/c tele All the records are reviewed and case discussed with Care Management/Social Workerr. Management plans discussed with the patient, family and they are in agreement.  CODE STATUS: Full code  TOTAL TIME TAKING CARE OF THIS PATIENT: 38 minutes.  More than 50% time spent in counseling, coordination of care POSSIBLE D/C IN 2-3 DAYS, DEPENDING ON CLINICAL CONDITION.   Epifanio Lesches M.D on 05/17/2019 at 2:53 PM  Between 7am to 6pm - Pager - [phone]  After 6pm go to [email or web address] - password [email or web address] [facility]  Office  [phone]  CC: Primary care physician; Patient, [name] Per   Note: This dictation was prepared with Dragon dictation along with smaller phrase technology. Any transcriptional errors that result from this process are unintentional.

## 2019-05-18 ENCOUNTER — Encounter
Admission: EM | Disposition: A | Discharge status: Home / Self Care | Payer: Self-pay | Source: Home / Self Care | Attending: Internal Medicine

## 2019-05-18 ENCOUNTER — Inpatient Hospital Stay: Payer: Self-pay | Admitting: Certified Registered"

## 2019-05-18 ENCOUNTER — Ambulatory Visit: Admit: 2019-05-18 | Payer: Self-pay | Admitting: Gastroenterology

## 2019-05-18 DIAGNOSIS — K228 Other specified diseases of esophagus: Secondary | ICD-10-CM

## 2019-05-18 DIAGNOSIS — K209 Esophagitis, unspecified: Secondary | ICD-10-CM

## 2019-05-18 DIAGNOSIS — R1310 Dysphagia, unspecified: Secondary | ICD-10-CM

## 2019-05-18 HISTORY — PX: ESOPHAGOGASTRODUODENOSCOPY: SHX5428

## 2019-05-18 LAB — KOH PREP
KOH Prep: NONE SEEN
Special Requests: NORMAL

## 2019-05-18 LAB — IRON AND TIBC
Iron: 10 ug/dL — ABNORMAL LOW (ref 45–182)
Saturation Ratios: 5 % — ABNORMAL LOW (ref 17.9–39.5)
TIBC: 209 ug/dL — ABNORMAL LOW (ref 250–450)
UIBC: 199 ug/dL

## 2019-05-18 LAB — FOLATE: Folate: 17.8 ng/mL (ref >5.9)

## 2019-05-18 LAB — VITAMIN B12: Vitamin B-12: 644 pg/mL (ref 180–914)

## 2019-05-18 LAB — FERRITIN: Ferritin: 21 ng/mL — ABNORMAL LOW (ref 24–336)

## 2019-05-18 SURGERY — ESOPHAGOGASTRODUODENOSCOPY (EGD) WITH PROPOFOL
Anesthesia: General

## 2019-05-18 SURGERY — EGD (ESOPHAGOGASTRODUODENOSCOPY)
Anesthesia: General

## 2019-05-18 MED ORDER — PROPOFOL 10 MG/ML IV BOLUS
INTRAVENOUS | Status: DC | PRN
  Administered 2019-05-18 (×3): 20 mg via INTRAVENOUS
  Administered 2019-05-18 (×2): 50 mg via INTRAVENOUS

## 2019-05-18 MED ORDER — SODIUM CHLORIDE 0.9 % IV SOLN
INTRAVENOUS | Status: DC
  Administered 2019-05-18: 11:00:00 via INTRAVENOUS

## 2019-05-18 MED ORDER — SUCRALFATE 1 GM/10ML PO SUSP
1.0000 g | Freq: Three times a day (TID) | ORAL | Status: DC
  Administered 2019-05-18 – 2019-05-19 (×4): 1 g via ORAL
  Filled 2019-05-18 (×4): qty 10

## 2019-05-18 NOTE — Progress Notes (Signed)
[facility] [place] NAME: George Wade    MR#:  [id number]  DATE OF BIRTH:  [birth year]  SUBJECTIVE: Status post EGD showing ulcerated mucosa in esophagus, biopsies pending.  Gastroenterology recommends PPIs twice daily for 3 months, follow-up in 1 month with gastroenterology.  Patient is homeless, requesting to talk to case manager for disposition..  CHIEF COMPLAINT:   Chief Complaint  Patient presents with  . Shortness of Breath  Heavy alcohol abuse, last drink was 3 days ago,  REVIEW OF SYSTEMS:   Review of Systems  Constitutional: Negative for chills and fever.  HENT: Negative for congestion and hearing loss.   Eyes: Negative for blurred vision, double vision and photophobia.  Respiratory: Positive for cough. Negative for hemoptysis and shortness of breath.   Cardiovascular: Negative for palpitations, orthopnea and leg swelling.  Gastrointestinal: Negative for abdominal pain, diarrhea and vomiting.  Genitourinary: Negative for dysuria and urgency.  Musculoskeletal: Negative for myalgias and neck pain.  Skin: Negative for rash.  Neurological: Negative for dizziness, focal weakness, seizures, weakness and headaches.  Psychiatric/Behavioral: Negative for memory loss. The patient does not have insomnia.      DRUG ALLERGIES:   Allergies  Allergen Reactions  . Sulfa Antibiotics Nausea Only    VITALS:  Blood pressure 109/81, pulse 84, temperature 97.8 F (36.6 C), temperature source Oral, resp. rate 19, height 5\' 8"  (1.727 m), weight 76.6 kg, SpO2 100 %.  PHYSICAL EXAMINATION:  GENERAL:  63 y.o.-year-old patient lying in the bed with no acute distress.  EYES: Pupils equal, round, reactive to light . No scleral icterus. Extraocular muscles intact.  HEENT: Head atraumatic, normocephalic. Oropharynx and nasopharynx clear.  NECK:  Supple, no jugular venous distention. No thyroid enlargement, no tenderness.  LUNGS: Niminished  air entry bilaterally. CARDIOVASCULAR: S1, S2 normal. No murmurs, rubs, or gallops.  ABDOMEN: Soft, nontender, nondistended. Bowel sounds present. No organomegaly or mass.  EXTREMITIES: No pedal edema, cyanosis, or clubbing.  NEUROLOGIC: Cranial nerves II through XII are intact. Muscle strength 5/5 in all extremities. Sensation intact. Gait not checked.  PSYCHIATRIC: The patient is alert and oriented x 3.  SKIN: No obvious rash, lesion, or ulcer.    LABORATORY PANEL:   CBC Recent Labs  Lab 05/17/19 0557  WBC 5.4  HGB 7.7*  HCT 27.1*  PLT 208   ------------------------------------------------------------------------------------------------------------------  Chemistries  Recent Labs  Lab 05/15/19 1405 05/16/19 0049  NA 136 134*  K 3.8 3.8  CL 93* 100  CO2 27 26  GLUCOSE 112* 129*  BUN 6* 5*  CREATININE 0.47* 0.46*  CALCIUM 9.7 7.4*  AST 33  --   ALT 31  --   ALKPHOS 157*  --   BILITOT 0.5  --    ------------------------------------------------------------------------------------------------------------------  Cardiac Enzymes No results for input(s): TROPONINI in the last 168 hours. ------------------------------------------------------------------------------------------------------------------  RADIOLOGY:  No results found.  EKG:   Orders placed or performed during the hospital encounter of 05/15/19  . ED EKG 12-Lead  . ED EKG 12-Lead  . EKG 12-Lead  . EKG 12-Lead    ASSESSMENT AND PLAN:   #1. acute respiratory failure with hypoxia secondary to pneumonia, patient has history of recurrent aspiration pneumonia, off oxygen, hemodynamically stable, continue cefepime.   2.  Sepsis secondary to pneumonia, improving, continue IV antibiotics, procalcitonin level is down,.  Continue cefepime, leukocytosis also improved..  CT chest showed right base pneumonia patient needs as per CT in 1 month to follow-up  on pneumonia and also lymphadenopathy.    3.  Alcohol  abuse, patient is on alcohol withdrawal protocol with Ativan.  Continue thiamine, folic acid.  Patient is homeless, requesting to palpable social worker regarding disposition.  4.  Abdominal pain, improved on PPIs now,, status post EGD showing ulcerated esophagus, gastroenterology recommends PPIs twice daily for 3 months, repeat endoscopy for follow-up of reevaluation.  Patient is advised to quit smoking, drinking.  All the records are reviewed and case discussed with Care Management/Social Workerr. Management plans discussed with the patient, family and they are in agreement.  CODE STATUS: Full code  TOTAL TIME TAKING CARE OF THIS PATIENT: 38 minutes.  More than 50% time spent in counseling, coordination of care POSSIBLE D/C IN 2-3 DAYS, DEPENDING ON CLINICAL CONDITION.   Epifanio Lesches M.D on 05/18/2019 at 3:01 PM  Between 7am to 6pm - Pager - [phone]  After 6pm go to [email or web address] - password [email or web address] [facility]  Office  [phone]  CC: Primary care physician; Patient, [name] Per   Note: This dictation was prepared with Dragon dictation along with smaller phrase technology. Any transcriptional errors that result from this process are unintentional.

## 2019-05-18 NOTE — Anesthesia Post-op Follow-up Note (Signed)
Anesthesia QCDR form completed.        

## 2019-05-18 NOTE — Transfer of Care (Signed)
Immediate Anesthesia Transfer of Care Note  Patient: George Wade  Procedure(s) Performed: ESOPHAGOGASTRODUODENOSCOPY (EGD) (N/A )  Patient Location: Endoscopy Unit  Anesthesia Type:General  Level of Consciousness: awake, drowsy and patient cooperative  Airway & Oxygen Therapy: Patient Spontanous Breathing and Patient connected to face mask oxygen  Post-op Assessment: Report given to RN and Post -op Vital signs reviewed and stable  Post vital signs: Reviewed and stable  Last Vitals:  Vitals Value Taken Time  BP 139/128 05/18/19 1248  Temp    Pulse 90 05/18/19 1251  Resp 18 05/18/19 1251  SpO2 100 % 05/18/19 1251  Vitals shown include unvalidated device data.  Last Pain:  Vitals:   05/18/19 1100  TempSrc: Tympanic  PainSc: 0-No pain         Complications: No apparent anesthesia complications, pt bp elevated, will cont to monitor. TFH

## 2019-05-18 NOTE — Progress Notes (Signed)
Patient refused bed alarm. Education performed, and patient verbalized understanding and still refused.

## 2019-05-18 NOTE — Consult Note (Signed)
Pharmacy Antibiotic Note  George Wade is a 63 y.o. male admitted on 05/15/2019 with PNA. Pharmacy has been consulted for cefepime dosing. This is day #4 of cefepime therapy. He was originally receiving care in the ICU but has since stabilized and under care on [place] since 8/5, leukocytosis has resolved and renal function has been stable  Plan:   continue cefepime 2 g IV q8H  SCr in am   Height: 5\' 8"  (172.7 cm) Weight: 168 lb 14 oz (76.6 kg) IBW/kg (Calculated) : 68.4  Temp (24hrs), Avg:98.7 F (37.1 C), Min:98.4 F (36.9 C), Max:99.3 F (37.4 C)  Recent Labs  Lab 05/15/19 1405 05/15/19 1611 05/15/19 2217 05/16/19 0049 05/17/19 0557  WBC 10.5  --   --  14.7* 5.4  CREATININE 0.47*  --   --  0.46*  --   LATICACIDVEN 4.4* 4.5* 1.7 1.8  --     Estimated Creatinine Clearance: 92.6 mL/min (A) (by C-G formula based on SCr of 0.46 mg/dL (L)).    Allergies  Allergen Reactions  . Sulfa Antibiotics Nausea Only    Antimicrobials this admission: 8/4 cefepime >>  8/4 vancomycin x 1 (Total 2000 mg loading dose given)  Microbiology results: 8/4 BCx: NGTD 8/4 UCx: NG  Thank you for allowing pharmacy to be a part of this patient's care.  Dallie Piles, PharmD 05/18/2019 9:08 AM

## 2019-05-18 NOTE — Consult Note (Signed)
Cephas Darby, MD [address]  [place]  [place],  [address]  Main: [phone]  Fax: [phone] Pager: [phone]   Consultation  Referring Provider:     No ref. provider found Primary Care Physician:  Patient, [name] Per Primary Gastroenterologist: None         Reason for Consultation: Dysphagia  Date of Admission:  05/15/2019 Date of Consultation:  05/18/2019         HPI:   George Wade is a 63 y.o. male with history of alcohol abuse, prior history of pneumonia who was initially admitted to ICU with hypoxic respiratory failure secondary to pneumonia on 05/15/2019, Patient had a CT chest abdomen and pelvis g during admission and was found to have moderate size hiatal hernia, thickening in lower part of the esophagus with possible infiltration, cannot rule out neoplasm.  Patient reports that for the last 20 years or so he has been having trouble swallowing, intermittently to solids and liquids.  He denies epigastric pain, heartburn, choking food impaction.  Patient is homeless, retired, has been actively drinking alcohol until before admission.  He denies having any EGD in the past  Patient denies shortness of breath, chest pain, fever, chills, nausea or vomiting, black stools.  Patient has been anemic, normal MCV, normal platelets.  He does not have cirrhosis  NSAIDs: None  Antiplts/Anticoagulants/Anti thrombotics: None  GI Procedures: Colonoscopy several years ago He denies having any GI surgeries  Past Medical History:  Diagnosis Date   ED (erectile dysfunction)    Hypercholesteremia    Hypertension    Lung cancer [facility])     Past Surgical History:  Procedure Laterality Date   [place]     REPLACEMENT TOTAL KNEE BILATERAL  2009   TONSILLECTOMY  2008    Prior to Admission medications   Medication Sig Start Date End Date Taking? Authorizing Provider  cloNIDine (CATAPRES) 0.1 MG tablet Take 1 tablet (0.1 mg  total) by mouth 2 (two) times daily. 04/27/19  Yes Sainani, Belia Heman, MD  folic acid (FOLVITE) 1 MG tablet Take 1 tablet (1 mg total) by mouth daily. 04/27/19 06/26/19 Yes Sainani, Belia Heman, MD  Multiple Vitamin (MULTIVITAMIN WITH MINERALS) TABS tablet Take 1 tablet by mouth daily. 12/12/17  Yes Salary, Holly Bodily D, MD  niacin 500 MG tablet Take 1 tablet (500 mg total) by mouth at bedtime. 04/27/19 06/26/19 Yes Sainani, Belia Heman, MD  omeprazole (PRILOSEC) 40 MG capsule Take 1 capsule (40 mg total) by mouth daily. 04/27/19 06/26/19 Yes Sainani, Belia Heman, MD  QUEtiapine (SEROQUEL) 25 MG tablet Take 1 tablet (25 mg total) by mouth 2 (two) times daily. 04/27/19 06/26/19 Yes Sainani, Belia Heman, MD  thiamine 100 MG tablet Take 1 tablet (100 mg total) by mouth daily. 04/27/19 06/26/19 Yes Sainani, Belia Heman, MD  traZODone (DESYREL) 50 MG tablet Take 1 tablet (50 mg total) by mouth at bedtime as needed for sleep. 04/27/19 06/26/19 Yes Henreitta Leber, MD    Current Facility-Administered Medications:    0.9 %  sodium chloride infusion, , Intravenous, Continuous, Edwards Mckelvie, Tally Due, MD, Last Rate: 20 mL/hr at 05/18/19 1106   [MAR Hold] ceFEPIme (MAXIPIME) 2 g in sodium chloride 0.9 % 100 mL IVPB, 2 g, Intravenous, Q8H, Ouma, Bing Neighbors, NP, Last Rate: 200 mL/hr at 05/18/19 0526, 2 g at 05/18/19 0526   [MAR Hold] cloNIDine (CATAPRES) tablet 0.1 mg, 0.1 mg, Oral, BID, Rufina Falco  Achieng, NP, 0.1 mg at 05/18/19 0950   [MAR Hold] enoxaparin (LOVENOX) injection 40 mg, 40 mg, Subcutaneous, Q24H, Ouma, Bing Neighbors, NP, 40 mg at 05/17/19 2144   [facility] Hold] feeding supplement (NEPRO CARB STEADY) liquid 237 mL, 237 mL, Oral, BID BM, Epifanio Lesches, MD, 237 mL at 05/17/19 8546   [MAR Hold] folic acid (FOLVITE) tablet 1 mg, 1 mg, Oral, Daily, Ouma, Bing Neighbors, NP, 1 mg at 05/17/19 1013   [MAR Hold] ipratropium-albuterol (DUONEB) 0.5-2.5 (3) MG/3ML nebulizer solution 3 mL, 3 mL, Nebulization, Q15 min PRN,  Blake Divine, MD, 3 mL at 05/15/19 1419   [MAR Hold] LORazepam (ATIVAN) tablet 1 mg, 1 mg, Oral, Q6H PRN **OR** [MAR Hold] LORazepam (ATIVAN) injection 1 mg, 1 mg, Intravenous, Q6H PRN, Bradly Bienenstock, NP   [facility] Hold] morphine 2 MG/ML injection 1-2 mg, 1-2 mg, Intravenous, Q2H PRN, Flora Lipps, MD, 2 mg at 05/16/19 1247   [MAR Hold] multivitamin with minerals tablet 1 tablet, 1 tablet, Oral, Daily, Ouma, Bing Neighbors, NP, 1 tablet at 05/17/19 1013   [MAR Hold] niacin (SLO-NIACIN) CR tablet 500 mg, 500 mg, Oral, QHS, Ouma, Bing Neighbors, NP, 500 mg at 05/17/19 2150   [facility] Hold] ondansetron (ZOFRAN) injection 4 mg, 4 mg, Intravenous, Q6H PRN, Darel Hong D, NP, 4 mg at 05/17/19 1759   [MAR Hold] pantoprazole (PROTONIX) EC tablet 40 mg, 40 mg, Oral, BID AC, Aren Cherne, Tally Due, MD, 40 mg at 05/17/19 1700   [MAR Hold] promethazine (PHENERGAN) injection 12.5 mg, 12.5 mg, Intravenous, Q6H PRN, Darel Hong D, NP, 12.5 mg at 05/16/19 1714   [MAR Hold] QUEtiapine (SEROQUEL) tablet 25 mg, 25 mg, Oral, BID, Ouma, Bing Neighbors, NP, 25 mg at 05/17/19 2144   [facility] Hold] thiamine (VITAMIN B-1) tablet 100 mg, 100 mg, Oral, Daily, Ouma, Bing Neighbors, NP, 100 mg at 05/17/19 1013   [MAR Hold] traZODone (DESYREL) tablet 50 mg, 50 mg, Oral, QHS PRN, Lang Snow, NP, 50 mg at 05/17/19 2144  Family History  Problem Relation Age of Onset   Cancer Mother    Hypertension Father    Cancer Paternal Grandmother      Social History   Tobacco Use   Smoking status: Never Smoker   Smokeless tobacco: Never Used  Substance Use Topics   Alcohol use: No    Frequency: Never    Comment: Quit 2 days ago   Drug use: No    Allergies as of 05/15/2019 - Review Complete 05/15/2019  Allergen Reaction Noted   Sulfa antibiotics Nausea Only 08/30/2018    Review of Systems:    All systems reviewed and negative except where noted in HPI.   Physical Exam:  Vital  signs in last 24 hours: Temp:  [96.8 F (36 C)-99.3 F (37.4 C)] 96.8 F (36 C) (08/07 1100) Pulse Rate:  [91-109] 91 (08/07 1100) Resp:  [16-20] 18 (08/07 1100) BP: (99-134)/(72-94) 134/94 (08/07 1100) SpO2:  [96 %-100 %] 100 % (08/07 1100) Weight:  [76.6 kg] 76.6 kg (08/07 1100) Last BM Date: 05/17/19 General:   Pleasant, cooperative in NAD Head:  Normocephalic and atraumatic. Eyes:   No icterus.   Conjunctiva pale. PERRLA. Ears:  Normal auditory acuity. Neck:  Supple; no masses or thyroidomegaly Lungs: Respirations even and unlabored. Lungs clear to auscultation bilaterally.   No wheezes, crackles, or rhonchi.  Heart:  Regular rate and rhythm;  Without murmur, clicks, rubs or gallops Abdomen:  Soft, nondistended, nontender. Normal bowel sounds. No appreciable masses  or hepatomegaly.  No rebound or guarding.  Rectal:  Not performed. Msk:  Symmetrical without gross deformities.  Strength generalized weakness Extremities:  Without edema, cyanosis or clubbing. Neurologic:  Alert and oriented x3;  grossly normal neurologically. Skin:  Intact without significant lesions or rashes. Psych:  Alert and cooperative. Normal affect.  LAB RESULTS: CBC Latest Ref Rng & Units 05/17/2019 05/16/2019 05/15/2019  WBC 4.0 - 10.5 K/uL 5.4 14.7(H) 10.5  Hemoglobin 13.0 - 17.0 g/dL 7.7(L) 8.0(L) 9.1(L)  Hematocrit 39.0 - 52.0 % 27.1(L) 27.4(L) 31.3(L)  Platelets 150 - 400 K/uL 208 224 368    BMET BMP Latest Ref Rng & Units 05/16/2019 05/15/2019 04/28/2019  Glucose 70 - 99 mg/dL 129(H) 112(H) -  BUN 8 - 23 mg/dL 5(L) 6(L) -  Creatinine 0.61 - 1.24 mg/dL 0.46(L) 0.47(L) -  Sodium 135 - 145 mmol/L 134(L) 136 -  Potassium 3.5 - 5.1 mmol/L 3.8 3.8 3.8  Chloride 98 - 111 mmol/L 100 93(L) -  CO2 22 - 32 mmol/L 26 27 -  Calcium 8.9 - 10.3 mg/dL 7.4(L) 9.7 -    LFT Hepatic Function Latest Ref Rng & Units 05/15/2019 04/10/2019 04/07/2019  Total Protein 6.5 - 8.1 g/dL 6.6 - 5.0(L)  Albumin 3.5 - 5.0 g/dL 3.0(L)  2.0(L) 2.0(L)  AST 15 - 41 U/L 33 - 80(H)  ALT 0 - 44 U/L 31 - 76(H)  Alk Phosphatase 38 - 126 U/L 157(H) - 213(H)  Total Bilirubin 0.3 - 1.2 mg/dL 0.5 - 3.6(H)     STUDIES: US Venous Img Lower Unilateral Right  Result Date: 05/16/2019 CLINICAL DATA:  Right lower extremity edema. History of lymphoma. Evaluate for DVT. EXAM: RIGHT LOWER EXTREMITY VENOUS DOPPLER ULTRASOUND TECHNIQUE: Gray-scale sonography with graded compression, as well as color Doppler and duplex ultrasound were performed to evaluate the lower extremity deep venous systems from the level of the common femoral vein and including the common femoral, femoral, profunda femoral, popliteal and calf veins including the posterior tibial, peroneal and gastrocnemius veins when visible. The superficial great saphenous vein was also interrogated. Spectral Doppler was utilized to evaluate flow at rest and with distal augmentation maneuvers in the common femoral, femoral and popliteal veins. COMPARISON:  None. FINDINGS: Contralateral Common Femoral Vein: Respiratory phasicity is normal and symmetric with the symptomatic side. No evidence of thrombus. Normal compressibility. Common Femoral Vein: No evidence of thrombus. Normal compressibility, respiratory phasicity and response to augmentation. Saphenofemoral Junction: No evidence of thrombus. Normal compressibility and flow on color Doppler imaging. Profunda Femoral Vein: No evidence of thrombus. Normal compressibility and flow on color Doppler imaging. Femoral Vein: No evidence of thrombus. Normal compressibility, respiratory phasicity and response to augmentation. Popliteal Vein: No evidence of thrombus. Normal compressibility, respiratory phasicity and response to augmentation. Calf Veins: No evidence of thrombus. Normal compressibility and flow on color Doppler imaging. Superficial Great Saphenous Vein: No evidence of thrombus. Normal compressibility. Venous Reflux:  None. Other Findings:  None.  IMPRESSION: No evidence of DVT within the right lower extremity. Electronically Signed   By: Sandi Mariscal M.D.   On: 05/16/2019 12:34      Impression / Plan:   George Wade is a 63 y.o. male with active alcohol use, consulted for dysphagia, abnormal CT of the esophagus  Differentials include erosive esophagitis or Candida esophagitis or peptic ulcer disease or esophageal cancer  N.p.o. IV Protonix twice daily Abstinence from alcohol Perform EGD today as patient has clinically recovered from pneumonia  I have discussed alternative options,  risks & benefits,  which include, but are not limited to, bleeding, infection, perforation,respiratory complication & drug reaction.  The patient agrees with this plan & written consent will be obtained.     Thank you for involving me in the care of this patient.      LOS: 3 days   Sherri Sear, MD  05/18/2019, 11:47 AM   Note: This dictation was prepared with Dragon dictation along with smaller phrase technology. Any transcriptional errors that result from this process are unintentional.

## 2019-05-18 NOTE — Op Note (Signed)
[facility] Patient Name: George Wade Procedure Date: 05/18/2019 12:32 PM MRN: [id number] Account #: [id number] Date of Birth: 11-Jul-1956 Admit Type: Inpatient Age: 63 Room: [facility] ENDO ROOM 1 Gender: Male Note Status: Finalized Procedure:            Upper GI endoscopy Indications:          Esophageal dysphagia, Abnormal CT of the GI tract Providers:            Lin Landsman MD, MD Referring MD:         No Local Md, MD (Referring MD) Medicines:            Monitored Anesthesia Care Complications:        No immediate complications. Estimated blood loss: None. Procedure:            Pre-Anesthesia Assessment:                       - Prior to the procedure, a History and Physical was                        performed, and patient medications and allergies were                        reviewed. The patient is competent. The risks and                        benefits of the procedure and the sedation options and                        risks were discussed with the patient. All questions                        were answered and informed consent was obtained.                        Patient identification and proposed procedure were                        verified by the physician, the nurse, the                        anesthesiologist, the anesthetist and the technician in                        the pre-procedure area in the procedure room in the                        endoscopy suite. Mental Status Examination: alert and                        oriented. Airway Examination: normal oropharyngeal                        airway and neck mobility. Respiratory Examination:                        clear to auscultation. CV Examination: normal.                        Prophylactic Antibiotics: The patient does not  require                        prophylactic antibiotics. Prior Anticoagulants: The                        patient has taken no previous anticoagulant or                         antiplatelet agents. ASA Grade Assessment: II - A                        patient with mild systemic disease. After reviewing the                        risks and benefits, the patient was deemed in                        satisfactory condition to undergo the procedure. The                        anesthesia plan was to use monitored anesthesia care                        (MAC). Immediately prior to administration of                        medications, the patient was re-assessed for adequacy                        to receive sedatives. The heart rate, respiratory rate,                        oxygen saturations, blood pressure, adequacy of                        pulmonary ventilation, and response to care were                        monitored throughout the procedure. The physical status                        of the patient was re-assessed after the procedure.                       After obtaining informed consent, the endoscope was                        passed under direct vision. Throughout the procedure,                        the patient's blood pressure, pulse, and oxygen                        saturations were monitored continuously. The Endoscope                        was introduced through the mouth, and advanced to the                        second part of  duodenum. The upper GI endoscopy was                        accomplished without difficulty. The patient tolerated                        the procedure well. Findings:      The duodenal bulb and second portion of the duodenum were normal.      The entire examined stomach was normal.      The cardia and gastric fundus were normal on retroflexion.      Diffuse severe mucosal changes characterized by sloughing and ulceration       were found in the middle third of the esophagus and in the lower third       of the esophagus. Cells for cytology were obtained by brushing. Impression:           - Normal  duodenal bulb and second portion of the                        duodenum.                       - Normal stomach.                       - Ulcerated mucosa in the esophagus. Cells for cytology                        obtained. Recommendation:       - Transfer patient to another hospital.                       - Use a proton pump inhibitor PO BID atleast for                        71month.                       - Repeat upper endoscopy in 3 months to check healing.                       - Return to GI office in 1 month. Procedure Code(s):    --- Professional ---                       [phone] Esophagogastroduodenoscopy, flexible, transoral;                        diagnostic, including collection of specimen(s) by                        brushing or washing, when performed (separate procedure) Diagnosis Code(s):    --- Professional ---                       K22.10, Ulcer of esophagus without bleeding                       R13.14, Dysphagia, pharyngoesophageal phase                       R93.3, Abnormal findings on diagnostic imaging of other  parts of digestive tract CPT copyright 2019 American Medical Association. All rights reserved. The codes documented in this report are preliminary and upon coder review may  be revised to meet current compliance requirements. Dr. Ulyess Mort Lin Landsman MD, MD 05/18/2019 12:44:29 PM This report has been signed electronically. Number of Addenda: 0 Note Initiated On: 05/18/2019 12:32 PM Estimated Blood Loss: Estimated blood loss: none.      [facility]

## 2019-05-18 NOTE — TOC Initial Note (Signed)
Transition of Care [facility]) - Initial/Assessment Note    Patient Details  Name: George Wade MRN: [id number] Date of Birth: [birth year]  [facility] CM/SW Contact:    Candie Chroman, LCSW Phone Number: 05/18/2019, 3:59 PM  Clinical Narrative:  CSW met with patient, introduced role, and inquired about needs. Patient has been staying in his truck and is eager for a place to stay. He is willing to go to a shelter and is also hoping to rent a mobile home in the near future. He has a phone number for a mobile home place in [place] that he definitely plans on calling. Patient has monthly income through social security (a little over $1900). Discussed Medicaid/disability. CSW has left a voicemail for the financial counselor with his phone number for her to call him. Gave him website to apply for Medicaid online. Also left voicemails for [employer] and [employer] and [employer]. Included his phone number for them as well. CSW gave him a list of local temporary employment agencies in [place]. Patient has four children but says the relationships are strained. He hopes to mend those in the future. CSW provided emotional support. No further concerns. CSW encouraged patient to contact CSW as needed. CSW will continue to follow patient for support and facilitate discharge once stable.                   Expected Discharge Plan: Home/Self Care Barriers to Discharge: Continued Medical Work up   Patient Goals and CMS Choice     Choice offered to / list presented to : NA  Expected Discharge Plan and Services Expected Discharge Plan: Home/Self Care       Living arrangements for the past 2 months: Hotel/Motel, Homeless                                      Prior Living Arrangements/Services Living arrangements for the past 2 months: Hotel/Motel, Homeless Lives with:: Self Patient language and need for interpreter reviewed:: Yes Do you feel safe  going back to the place where you live?: No   Lives in his truck.  Need for Family Participation in Patient Care: No (Comment) Care giver support system in place?: No (comment)   Criminal Activity/Legal Involvement Pertinent to Current Situation/Hospitalization: No - Comment as needed  Activities of Daily Living Home Assistive Devices/Equipment: Cane (specify quad or straight) ADL Screening (condition at time of admission) Patient's cognitive ability adequate to safely complete daily activities?: Yes Is the patient deaf or have difficulty hearing?: No Does the patient have difficulty seeing, even when wearing glasses/contacts?: No Does the patient have difficulty concentrating, remembering, or making decisions?: No Patient able to express need for assistance with ADLs?: Yes Does the patient have difficulty dressing or bathing?: No Independently performs ADLs?: Yes (appropriate for developmental age) Does the patient have difficulty walking or climbing stairs?: No Weakness of Legs: Both Weakness of Arms/Hands: None  Permission Sought/Granted   Permission granted to share information with : Yes, Verbal Permission Granted     Permission granted to share info w AGENCY: Assistance agencies        Emotional Assessment Appearance:: Appears stated age Attitude/Demeanor/Rapport: Engaged, Gracious Affect (typically observed): Accepting, Appropriate, Calm, Pleasant Orientation: : Oriented to Self, Oriented to Place, Oriented to  Time, Oriented to Situation Alcohol / Substance Use: Alcohol Use Psych Involvement: No (comment)  Admission diagnosis:  Epigastric pain [R10.13] SOB (shortness of breath) [R06.02] Hypoxia [R09.02] Multifocal pneumonia [J18.9] Sepsis with acute hypoxic respiratory failure without septic shock, due to unspecified organism ([place]) [A41.9, R65.20, J96.01] Patient Active Problem List   Diagnosis Date Noted  . Acute respiratory failure with hypoxia ([place]) 05/15/2019   . Wernicke's encephalopathy 04/15/2019  . GI bleed 03/31/2019  . Alcohol withdrawal ([place]) 12/09/2017   PCP:  Patient, [name] Per Pharmacy:   [facility] [address] [place] [address] Phone: [phone] Fax: [phone]     Social Determinants of Health (SDOH) Interventions    Readmission Risk Interventions No flowsheet data found.

## 2019-05-18 NOTE — Anesthesia Preprocedure Evaluation (Signed)
Anesthesia Evaluation  Patient identified by MRN, date of birth, ID band Patient awake    Reviewed: Allergy & Precautions, NPO status , Patient's Chart, lab work & pertinent test results  History of Anesthesia Complications Negative for: history of anesthetic complications  Airway Mallampati: II  TM Distance: >3 FB Neck ROM: Full    Dental  (+) Poor Dentition   Pulmonary neg pulmonary ROS, neg sleep apnea, neg COPD,    breath sounds clear to auscultation- rhonchi (-) wheezing      Cardiovascular hypertension, Pt. on medications (-) CAD, (-) Past MI, (-) Cardiac Stents and (-) CABG  Rhythm:Regular Rate:Normal - Systolic murmurs and - Diastolic murmurs    Neuro/Psych neg Seizures PSYCHIATRIC DISORDERS (EtOH abuse) negative neurological ROS     GI/Hepatic negative GI ROS, Neg liver ROS,   Endo/Other  negative endocrine ROSneg diabetes  Renal/GU negative Renal ROS     Musculoskeletal negative musculoskeletal ROS (+)   Abdominal (+) - obese,   Peds  Hematology negative hematology ROS (+)   Anesthesia Other Findings Past Medical History: No date: ED (erectile dysfunction) No date: Hypercholesteremia No date: Hypertension No date: Lung cancer ([place])   Reproductive/Obstetrics                             Anesthesia Physical Anesthesia Plan  ASA: II  Anesthesia Plan: General   Post-op Pain Management:    Induction: Intravenous  PONV Risk Score and Plan: 1 and Propofol infusion  Airway Management Planned: Natural Airway  Additional Equipment:   Intra-op Plan:   Post-operative Plan:   Informed Consent: I have reviewed the patients History and Physical, chart, labs and discussed the procedure including the risks, benefits and alternatives for the proposed anesthesia with the patient or authorized representative who has indicated his/her understanding and acceptance.     Dental  advisory given  Plan Discussed with: CRNA and Anesthesiologist  Anesthesia Plan Comments:         Anesthesia Quick Evaluation

## 2019-05-18 NOTE — Anesthesia Postprocedure Evaluation (Signed)
Anesthesia Post Note  Patient: Arhaan Chesnut  Procedure(s) Performed: ESOPHAGOGASTRODUODENOSCOPY (EGD) (N/A )  Patient location during evaluation: Endoscopy Anesthesia Type: General Level of consciousness: awake and alert Pain management: pain level controlled Vital Signs Assessment: post-procedure vital signs reviewed and stable Respiratory status: spontaneous breathing, nonlabored ventilation, respiratory function stable and patient connected to nasal cannula oxygen Cardiovascular status: blood pressure returned to baseline and stable Postop Assessment: no apparent nausea or vomiting Anesthetic complications: no     Last Vitals:  Vitals:   05/18/19 1326 05/18/19 1336  BP: 110/78 110/80  Pulse: 88 82  Resp: 13 16  Temp:    SpO2: 100% 100%    Last Pain:  Vitals:   05/18/19 1316  TempSrc:   PainSc: 0-No pain                 Martha Clan

## 2019-05-19 MED ORDER — OMEPRAZOLE 40 MG PO CPDR: 40.0000 mg | DELAYED_RELEASE_CAPSULE | Freq: Every day | ORAL | 1 refills | Status: DC

## 2019-05-19 MED ORDER — AMOXICILLIN-POT CLAVULANATE 875-125 MG PO TABS: 1.0000 | ORAL_TABLET | Freq: Two times a day (BID) | ORAL | 0 refills | Status: AC

## 2019-05-19 NOTE — Discharge Summary (Signed)
[facility] NAME: George Wade    MR#:  [id number]  DATE OF BIRTH:  1956-08-08  DATE OF ADMISSION:  05/15/2019 ADMITTING PHYSICIAN: Lang Snow, NP  DATE OF DISCHARGE: 05/19/2019  PRIMARY CARE PHYSICIAN: Patient, [name] Per    ADMISSION DIAGNOSIS:  Epigastric pain [R10.13] SOB (shortness of breath) [R06.02] Hypoxia [R09.02] Multifocal pneumonia [J18.9] Sepsis with acute hypoxic respiratory failure without septic shock, due to unspecified organism ([place]) [A41.9, R65.20, J96.01]  DISCHARGE DIAGNOSIS:  Active Problems:   Acute respiratory failure with hypoxia ([place])   SECONDARY DIAGNOSIS:   Past Medical History:  Diagnosis Date  . ED (erectile dysfunction)   . Hypercholesteremia   . Hypertension   . Lung cancer [facility])     HOSPITAL COURSE:   63 year old male with past medical history of lung cancer, hypertension, hyperlipidemia, alcohol abuse who presented to the hospital due to shortness of breath and noted to be in acute respiratory failure secondary to pneumonia.  1.  Acute respiratory failure with hypoxia-secondary to pneumonia.  Thought to be possible aspiration pneumonia. - Patient was treated with IV cefepime, and has clinically improved, he is no longer hypoxic.  His cultures are negative. - He is being discharged on empiric oral Augmentin.  2.  Sepsis- suspected to be secondary to pneumonia on admission.  Now it has been ruled out. -Empirically treated with broad-spectrum IV antibiotics and not being discharged on empiric Augmentin for suspected aspiration pneumonia.  3.  Abdominal pain- patient had vague abdominal pain during the hospitalization and therefore a gastroenterology consult obtained.  Given his alcohol abuse patient underwent an EGD which showed ulcerated mucosa in the esophagus, normal stomach but no other acute bleeding. - Patient will continue his PPI twice daily.  He was strongly advised to  abstain from alcohol.  4.  Essential hypertension-patient resume his clonidine.  5.  History of alcohol abuse- patient had no evidence of alcohol withdrawal while in the hospital.  He will continue with thiamine folate supplements.  DISCHARGE CONDITIONS:   Stable.   CONSULTS OBTAINED:    DRUG ALLERGIES:   Allergies  Allergen Reactions  . Sulfa Antibiotics Nausea Only    DISCHARGE MEDICATIONS:   Allergies as of 05/19/2019      Reactions   Sulfa Antibiotics Nausea Only      Medication List    TAKE these medications   amoxicillin-clavulanate 875-125 MG tablet Commonly known as: Augmentin Take 1 tablet by mouth 2 (two) times daily for 5 days.   cloNIDine 0.1 MG tablet Commonly known as: CATAPRES Take 1 tablet (0.1 mg total) by mouth 2 (two) times daily.   folic acid 1 MG tablet Commonly known as: FOLVITE Take 1 tablet (1 mg total) by mouth daily.   multivitamin with minerals Tabs tablet Take 1 tablet by mouth daily.   niacin 500 MG tablet Take 1 tablet (500 mg total) by mouth at bedtime.   omeprazole 40 MG capsule Commonly known as: PRILOSEC Take 1 capsule (40 mg total) by mouth daily.   QUEtiapine 25 MG tablet Commonly known as: SEROQUEL Take 1 tablet (25 mg total) by mouth 2 (two) times daily.   thiamine 100 MG tablet Take 1 tablet (100 mg total) by mouth daily.   traZODone 50 MG tablet Commonly known as: DESYREL Take 1 tablet (50 mg total) by mouth at bedtime as needed for sleep.         DISCHARGE INSTRUCTIONS:   DIET:  Cardiac diet  DISCHARGE CONDITION:  Stable  ACTIVITY:  Activity as tolerated  OXYGEN:  Home Oxygen: No.   Oxygen Delivery: room air  DISCHARGE LOCATION:  home   If you experience worsening of your admission symptoms, develop shortness of breath, life threatening emergency, suicidal or homicidal thoughts you must seek medical attention immediately by calling 911 or calling your MD immediately  if symptoms less  severe.  You Must read complete instructions/literature along with all the possible adverse reactions/side effects for all the Medicines you take and that have been prescribed to you. Take any new Medicines after you have completely understood and accpet all the possible adverse reactions/side effects.   Please note  You were cared for by a hospitalist during your hospital stay. If you have any questions about your discharge medications or the care you received while you were in the hospital after you are discharged, you can call the unit and asked to speak with the hospitalist on call if the hospitalist that took care of you is not available. Once you are discharged, your primary care physician will handle any further medical issues. Please note that NO REFILLS for any discharge medications will be authorized once you are discharged, as it is imperative that you return to your primary care physician (or establish a relationship with a primary care physician if you do not have one) for your aftercare needs so that they can reassess your need for medications and monitor your lab values.     Today   No acute events overnight, denies any chest pain, shortness of breath or any other associated symptoms.  Patient anxiously wants to leave, will discharge home today.  VITAL SIGNS:  Blood pressure 102/87, pulse (!) 115, temperature 98.3 F (36.8 C), temperature source Oral, resp. rate 18, height 5\' 8"  (1.727 m), weight 76.6 kg, SpO2 100 %.  I/O:    Intake/Output Summary (Last 24 hours) at 05/19/2019 1526 Last data filed at 05/19/2019 1100 Gross per 24 hour  Intake -  Output 1350 ml  Net -1350 ml    PHYSICAL EXAMINATION:  GENERAL:  63 y.o.-year-old patient lying in the bed with no acute distress.  EYES: Pupils equal, round, reactive to light and accommodation. No scleral icterus. Extraocular muscles intact.  HEENT: Head atraumatic, normocephalic. Oropharynx and nasopharynx clear.  NECK:  Supple,  no jugular venous distention. No thyroid enlargement, no tenderness.  LUNGS: Normal breath sounds bilaterally, no wheezing, rales,rhonchi. No use of accessory muscles of respiration.  CARDIOVASCULAR: S1, S2 normal. No murmurs, rubs, or gallops.  ABDOMEN: Soft, non-tender, non-distended. Bowel sounds present. No organomegaly or mass.  EXTREMITIES: No pedal edema, cyanosis, or clubbing.  NEUROLOGIC: Cranial nerves II through XII are intact. No focal motor or sensory defecits b/l.  PSYCHIATRIC: The patient is alert and oriented x 3.  SKIN: No obvious rash, lesion, or ulcer.   DATA REVIEW:   CBC Recent Labs  Lab 05/17/19 0557  WBC 5.4  HGB 7.7*  HCT 27.1*  PLT 208    Chemistries  Recent Labs  Lab 05/15/19 1405 05/16/19 0049  NA 136 134*  K 3.8 3.8  CL 93* 100  CO2 27 26  GLUCOSE 112* 129*  BUN 6* 5*  CREATININE 0.47* 0.46*  CALCIUM 9.7 7.4*  AST 33  --   ALT 31  --   ALKPHOS 157*  --   BILITOT 0.5  --     Cardiac Enzymes No results for input(s): TROPONINI in the last 168  hours.  Microbiology Results  Results for orders placed or performed during the hospital encounter of 05/15/19  Blood Culture (routine x 2)     Status: None (Preliminary result)   Collection Time: 05/15/19  2:06 PM   Specimen: BLOOD  Result Value Ref Range Status   Specimen Description BLOOD BLOOD LEFT HAND  Final   Special Requests   Final    BOTTLES DRAWN AEROBIC AND ANAEROBIC Blood Culture adequate volume   Culture   Final    NO GROWTH 4 DAYS Performed at [facility], [address] [place], [place] [address]    Report Status PENDING  Incomplete  Blood Culture (routine x 2)     Status: None (Preliminary result)   Collection Time: 05/15/19  2:06 PM   Specimen: BLOOD  Result Value Ref Range Status   Specimen Description BLOOD LEFT ANTECUBITAL  Final   Special Requests   Final    BOTTLES DRAWN AEROBIC AND ANAEROBIC Blood Culture adequate volume   Culture   Final    NO GROWTH 4  DAYS Performed at [facility], [address] [place],  [address]    Report Status PENDING  Incomplete  SARS Coronavirus 2 [facility] order, Performed in [place] hospital lab) Nasopharyngeal Nasopharyngeal Swab     Status: None   Collection Time: 05/15/19  2:06 PM   Specimen: Nasopharyngeal Swab  Result Value Ref Range Status   SARS Coronavirus 2 NEGATIVE NEGATIVE Final    Comment: (NOTE) If result is NEGATIVE SARS-CoV-2 target nucleic acids are NOT DETECTED. The SARS-CoV-2 RNA is generally detectable in upper and lower  respiratory specimens during the acute phase of infection. The lowest  concentration of SARS-CoV-2 viral copies this assay can detect is 250  copies / mL. A negative result does not preclude SARS-CoV-2 infection  and should not be used as the sole basis for treatment or other  patient management decisions.  A negative result may occur with  improper specimen collection / handling, submission of specimen other  than nasopharyngeal swab, presence of viral mutation(s) within the  areas targeted by this assay, and inadequate number of viral copies  (<250 copies / mL). A negative result must be combined with clinical  observations, patient history, and epidemiological information. If result is POSITIVE SARS-CoV-2 target nucleic acids are DETECTED. The SARS-CoV-2 RNA is generally detectable in upper and lower  respiratory specimens dur ing the acute phase of infection.  Positive  results are indicative of active infection with SARS-CoV-2.  Clinical  correlation with patient history and other diagnostic information is  necessary to determine patient infection status.  Positive results do  not rule out bacterial infection or co-infection with other viruses. If result is PRESUMPTIVE POSTIVE SARS-CoV-2 nucleic acids MAY BE PRESENT.   A presumptive positive result was obtained on the submitted specimen  and confirmed on repeat testing.  While 2019 novel  coronavirus  (SARS-CoV-2) nucleic acids may be present in the submitted sample  additional confirmatory testing may be necessary for epidemiological  and / or clinical management purposes  to differentiate between  SARS-CoV-2 and other Sarbecovirus currently known to infect humans.  If clinically indicated additional testing with an alternate test  methodology [phone]) is advised. The SARS-CoV-2 RNA is generally  detectable in upper and lower respiratory sp ecimens during the acute  phase of infection. The expected result is Negative. Fact Sheet for Patients:  StrictlyIdeas.no Fact Sheet for Healthcare Providers: BankingDealers.co.za This test is not yet  approved or cleared by the [place] and has been authorized for detection and/or diagnosis of SARS-CoV-2 by FDA under an Emergency Use Authorization (EUA).  This EUA will remain in effect (meaning this test can be used) for the duration of the COVID-19 declaration under Section 564(b)(1) of the Act, 21 U.S.C. section 360bbb-3(b)(1), unless the authorization is terminated or revoked sooner. Performed at [facility], [address] [place], [place] [address]   Urine culture     Status: None   Collection Time: 05/15/19  3:36 PM   Specimen: In/Out Cath Urine  Result Value Ref Range Status   Specimen Description   Final    IN/OUT CATH URINE Performed at [facility], [address] [place], [place] [address]    Special Requests   Final    NONE Performed at [facility], [address], [place], [place] [address]    Culture   Final    NO GROWTH Performed at [facility], [place] [address], [place], [place] [address]    Report Status 05/16/2019 FINAL  Final  MRSA PCR Screening     Status: None   Collection Time: 05/15/19  9:39 PM   Specimen: Nasopharyngeal  Result Value Ref Range Status   MRSA by PCR NEGATIVE NEGATIVE Final    Comment:         The GeneXpert MRSA Assay (FDA approved for NASAL specimens only), is one component of a comprehensive MRSA colonization surveillance program. It is not intended to diagnose MRSA infection nor to guide or monitor treatment for MRSA infections. Performed at [facility], [address] [place], [place] [address]   C difficile quick scan w PCR reflex     Status: None   Collection Time: 05/16/19  8:00 PM   Specimen: STOOL  Result Value Ref Range Status   C Diff antigen NEGATIVE NEGATIVE Final   C Diff toxin NEGATIVE NEGATIVE Final   C Diff interpretation No C. difficile detected.  Final    Comment: Performed at [facility], [address] [place], [place] [address]  Gastrointestinal Panel by PCR , Stool     Status: None   Collection Time: 05/16/19  8:00 PM   Specimen: Stool  Result Value Ref Range Status   Campylobacter species NOT DETECTED NOT DETECTED Final   Plesimonas shigelloides NOT DETECTED NOT DETECTED Final   Salmonella species NOT DETECTED NOT DETECTED Final   Yersinia enterocolitica NOT DETECTED NOT DETECTED Final   Vibrio species NOT DETECTED NOT DETECTED Final   Vibrio cholerae NOT DETECTED NOT DETECTED Final   Enteroaggregative E coli (EAEC) NOT DETECTED NOT DETECTED Final   Enteropathogenic E coli (EPEC) NOT DETECTED NOT DETECTED Final   Enterotoxigenic E coli (ETEC) NOT DETECTED NOT DETECTED Final   Shiga like toxin producing E coli (STEC) NOT DETECTED NOT DETECTED Final   Shigella/Enteroinvasive E coli (EIEC) NOT DETECTED NOT DETECTED Final   Cryptosporidium NOT DETECTED NOT DETECTED Final   Cyclospora cayetanensis NOT DETECTED NOT DETECTED Final   Entamoeba histolytica NOT DETECTED NOT DETECTED Final   Giardia lamblia NOT DETECTED NOT DETECTED Final   Adenovirus F40/41 NOT DETECTED NOT DETECTED Final   Astrovirus NOT DETECTED NOT DETECTED Final   Norovirus GI/GII NOT DETECTED NOT DETECTED Final   Rotavirus A NOT DETECTED NOT DETECTED  Final   Sapovirus (I, II, IV, and V) NOT DETECTED NOT DETECTED Final    Comment: Performed at [facility], [place]  [address] [place], [place] [address]  KOH prep     Status: None   Collection Time: 05/18/19 12:45 PM   Specimen: Esophagus  Result Value Ref Range Status   Specimen Description ESOPHAGUS  Final   Special Requests Normal  Final   KOH Prep   Final    NO YEAST OR FUNGAL ELEMENTS SEEN Performed at [facility], [address], [address], [place] [address]    Report Status 05/18/2019 FINAL  Final    RADIOLOGY:  No results found.    Management plans discussed with the patient, family and they are in agreement.  CODE STATUS:     Code Status Orders  (From admission, onward)         Start     Ordered   05/15/19 1841  Full code  Continuous     05/15/19 1843       TOTAL TIME TAKING CARE OF THIS PATIENT: 40 minutes.    Henreitta Leber M.D on 05/19/2019 at 3:26 PM  Between 7am to 6pm - Pager - [phone]  After 6pm go to [email or web address] - Proofreader  [facility]  Office  [phone]  CC: Primary care physician; Patient, [name] Per

## 2019-05-20 LAB — CULTURE, BLOOD (ROUTINE X 2)
Culture: NO GROWTH
Culture: NO GROWTH
Special Requests: ADEQUATE
Special Requests: ADEQUATE

## 2019-05-21 ENCOUNTER — Encounter: Payer: Self-pay | Admitting: Gastroenterology

## 2019-05-21 LAB — VITAMIN B1: Vitamin B1 (Thiamine): 129.2 nmol/L (ref 66.5–200.0)

## 2019-06-05 ENCOUNTER — Encounter: Payer: Self-pay | Admitting: Emergency Medicine

## 2019-06-05 ENCOUNTER — Other Ambulatory Visit: Payer: Self-pay

## 2019-06-05 ENCOUNTER — Emergency Department
Admission: EM | Admit: 2019-06-05 | Discharge: 2019-06-05 | Disposition: A | Discharge status: Home / Self Care | Payer: Self-pay | Attending: Student | Admitting: Student

## 2019-06-05 DIAGNOSIS — K644 Residual hemorrhoidal skin tags: Secondary | ICD-10-CM | POA: Insufficient documentation

## 2019-06-05 DIAGNOSIS — Z882 Allergy status to sulfonamides status: Secondary | ICD-10-CM | POA: Insufficient documentation

## 2019-06-05 DIAGNOSIS — E782 Mixed hyperlipidemia: Secondary | ICD-10-CM | POA: Insufficient documentation

## 2019-06-05 DIAGNOSIS — A630 Anogenital (venereal) warts: Secondary | ICD-10-CM | POA: Insufficient documentation

## 2019-06-05 DIAGNOSIS — Z79899 Other long term (current) drug therapy: Secondary | ICD-10-CM | POA: Insufficient documentation

## 2019-06-05 DIAGNOSIS — I10 Essential (primary) hypertension: Secondary | ICD-10-CM | POA: Insufficient documentation

## 2019-06-05 LAB — CBC
HCT: 31.9 % — ABNORMAL LOW (ref 39.0–52.0)
Hemoglobin: 9.2 g/dL — ABNORMAL LOW (ref 13.0–17.0)
MCH: 22.9 pg — ABNORMAL LOW (ref 26.0–34.0)
MCHC: 28.8 g/dL — ABNORMAL LOW (ref 30.0–36.0)
MCV: 79.6 fL — ABNORMAL LOW (ref 80.0–100.0)
Platelets: 234 10*3/uL (ref 150–400)
RBC: 4.01 MIL/uL — ABNORMAL LOW (ref 4.22–5.81)
RDW: 18.2 % — ABNORMAL HIGH (ref 11.5–15.5)
WBC: 11.3 10*3/uL — ABNORMAL HIGH (ref 4.0–10.5)
nRBC: 0 % (ref 0.0–0.2)

## 2019-06-05 LAB — COMPREHENSIVE METABOLIC PANEL
ALT: 23 U/L (ref 0–44)
AST: 33 U/L (ref 15–41)
Albumin: 3.1 g/dL — ABNORMAL LOW (ref 3.5–5.0)
Alkaline Phosphatase: 107 U/L (ref 38–126)
Anion gap: 15 (ref 5–15)
BUN: 7 mg/dL — ABNORMAL LOW (ref 8–23)
CO2: 20 mmol/L — ABNORMAL LOW (ref 22–32)
Calcium: 8 mg/dL — ABNORMAL LOW (ref 8.9–10.3)
Chloride: 100 mmol/L (ref 98–111)
Creatinine, Ser: 0.49 mg/dL — ABNORMAL LOW (ref 0.61–1.24)
GFR calc Af Amer: >60 mL/min (ref >60)
GFR calc non Af Amer: >60 mL/min (ref >60)
Glucose, Bld: 129 mg/dL — ABNORMAL HIGH (ref 70–99)
Potassium: 3.9 mmol/L (ref 3.5–5.1)
Sodium: 135 mmol/L (ref 135–145)
Total Bilirubin: 0.4 mg/dL (ref 0.3–1.2)
Total Protein: 6.4 g/dL — ABNORMAL LOW (ref 6.5–8.1)

## 2019-06-05 LAB — LIPASE, BLOOD: Lipase: 29 U/L (ref 11–51)

## 2019-06-05 NOTE — ED Provider Notes (Addendum)
[facility] Emergency Department Provider Note  ____________________________________________   First MD Initiated Contact with Patient 06/05/19 1727     (approximate)  I have reviewed the triage vital signs and the nursing notes.  History  Chief Complaint rectal problem    HPI George Wade is a 63 y.o. male with a history of alcohol use, hypertension, hypercholesterolemia who presents to the emergency department with a rectal complaint.  Patient states over the last 3 weeks or so he has noticed multiple "growths" around his rectum.  He reports frequent loose stool and wiping, and occasionally sees streaks of bright blood when wiping.  He denies any fevers, nausea, abdominal pain, or abdominal distention. No vomiting. He has not been evaluated for this in the past.         Past Medical Hx Past Medical History:  Diagnosis Date  . ED (erectile dysfunction)   . Hypercholesteremia   . Hypertension   . Lung cancer [facility])     Problem List Patient Active Problem List   Diagnosis Date Noted  . Acute respiratory failure with hypoxia ([place]) 05/15/2019  . Wernicke's encephalopathy 04/15/2019  . GI bleed 03/31/2019  . Alcohol withdrawal ([place]) 12/09/2017    Past Surgical Hx Past Surgical History:  Procedure Laterality Date  . ESOPHAGOGASTRODUODENOSCOPY N/A 05/18/2019   Procedure: ESOPHAGOGASTRODUODENOSCOPY (EGD);  Surgeon: Lin Landsman, MD;  Location: [facility] ENDOSCOPY;  Service: Gastroenterology;  Laterality: N/A;  . [place]  . JOINT REPLACEMENT    . REPLACEMENT TOTAL KNEE BILATERAL  2009  . TONSILLECTOMY  2008    Medications Prior to Admission medications   Medication Sig Start Date End Date Taking? Authorizing Provider  cloNIDine (CATAPRES) 0.1 MG tablet Take 1 tablet (0.1 mg total) by mouth 2 (two) times daily. 04/27/19   Henreitta Leber, MD  folic acid (FOLVITE) 1 MG tablet Take 1 tablet (1 mg total) by mouth daily. 04/27/19 06/26/19   Henreitta Leber, MD  Multiple Vitamin (MULTIVITAMIN WITH MINERALS) TABS tablet Take 1 tablet by mouth daily. 12/12/17   Salary, Avel Peace, MD  niacin 500 MG tablet Take 1 tablet (500 mg total) by mouth at bedtime. 04/27/19 06/26/19  Henreitta Leber, MD  omeprazole (PRILOSEC) 40 MG capsule Take 1 capsule (40 mg total) by mouth daily. 05/19/19 07/18/19  Henreitta Leber, MD  QUEtiapine (SEROQUEL) 25 MG tablet Take 1 tablet (25 mg total) by mouth 2 (two) times daily. 04/27/19 06/26/19  Henreitta Leber, MD  thiamine 100 MG tablet Take 1 tablet (100 mg total) by mouth daily. 04/27/19 06/26/19  Henreitta Leber, MD  traZODone (DESYREL) 50 MG tablet Take 1 tablet (50 mg total) by mouth at bedtime as needed for sleep. 04/27/19 06/26/19  Henreitta Leber, MD    Allergies Sulfa antibiotics  Family Hx Family History  Problem Relation Age of Onset  . Cancer Mother   . Hypertension Father   . Cancer Paternal Grandmother     Social Hx Social History   Tobacco Use  . Smoking status: Never Smoker  . Smokeless tobacco: Never Used  Substance Use Topics  . Alcohol use: No    Frequency: Never    Comment: Quit 2 days ago  . Drug use: No     Review of Systems  Constitutional: Negative for fever. Negative for chills. Eyes: Negative for visual changes. ENT: Negative for sore throat. Cardiovascular: Negative for chest pain. Respiratory: Negative for shortness of breath. Gastrointestinal: Negative for  abdominal pain. Negative for nausea. Negative for vomiting. + rectal problem Genitourinary: Negative for dysuria. Musculoskeletal: Negative for leg swelling. Skin: Negative for rash. Neurological: Negative for for headaches.   Physical Exam  Vital Signs: ED Triage Vitals [06/05/19 1622]  Enc Vitals Group     BP 102/76     Pulse Rate (!) 115     Resp 16     Temp 98.3 F (36.8 C)     Temp Source Oral     SpO2 97 %     Weight 168 lb 14 oz (76.6 kg)     Height 5\' 8"  (1.727 m)     Head  Circumference      Peak Flow      Pain Score 6     Pain Loc      Pain Edu?      Excl. in [place]?     Constitutional: Alert and oriented. Appears older than stated age. Eyes: Conjunctivae clear. Sclera anicteric. Head: Normocephalic. Atraumatic. Nose: No congestion. No rhinorrhea. Mouth/Throat: Mucous membranes are moist.  Neck: No stridor.   Cardiovascular: Tachycardic, regular rhythm. No murmurs. Extremities well perfused. Respiratory: Normal respiratory effort.  Lungs CTAB. Gastrointestinal: Soft and non-tender throughout. No distention.  Rectal: External exam notable for anal skin tags as well as likely multiple anal warts. No hemorrhoids. No thrombosed hemorrhoids. No fissures.  Musculoskeletal: No lower extremity edema. Neurologic:  Normal speech and language. No gross focal neurologic deficits are appreciated.  Skin: Skin is warm, dry and intact. No rash noted. Psychiatric: Mood and affect are appropriate for situation.  EKG  Personally reviewed.   Rate: tachycardia, ~110 Rhythm: sinus Axis: normal Intervals: WNL No acute ischemic changes   Radiology  N/A   Procedures  Procedure(s) performed (including critical care):  Procedures   Initial Impression / Assessment and Plan / ED Course  63 y.o. male  who presents to the ED with rectal complaint, as above.  Exam reveals anal skin tags as well as likely anal warts, no fissure, no hemorrhoids or thrombosed hemorrhoids. Abdomen otherwise soft and NT.  Labs unchanged from baseline. Mildly tachycardic, suspect due to him being off of his clonidine. No other signs or symptoms of withdrawal.   Explained exam findings to patient, and advised follow up with general surgery for possible removal/further evaluation. (HIV screening in 03/2019 was negative.)  Will plan for discharge, with information for surgery as above, given return precautions.    Final Clinical Impression(s) / ED Diagnosis  Final diagnoses:  Anal  skin tag  Anal warts      Note:  This document was prepared using Dragon voice recognition software and may include unintentional dictation errors.   Lilia Pro., MD 06/05/19 2328    Lilia Pro., MD 06/05/19 [phone]

## 2019-06-05 NOTE — ED Notes (Signed)
Meal tray given 

## 2019-06-05 NOTE — ED Notes (Signed)
SW not available at this time. Pt has been seen by SW and case management at this facility before and was given resources at that time. States he has not utilized them. Pt given new resource book and address and phone numbers of nearest homeless night shelters. Pt has vehicle.

## 2019-06-05 NOTE — ED Notes (Signed)
Patient discharged to home per MD order. Patient in stable condition, and deemed medically cleared by ED provider for discharge. Discharge instructions reviewed with patient/family using "Teach Back"; verbalized understanding of medication education and administration, and information about follow-up care. Denies further concerns. ° °

## 2019-06-05 NOTE — ED Triage Notes (Addendum)
Pt here for "something growing out my ass".  C/o constant stools and will know he has to go but no control over them.  "feels like fingers out my butt".  No fever. Has seen bright red blood in stool.  Has had vomiting.  Pain to right side of abdomen.  Pt has had difficulty urinating.  Pain is all outside of rectum.  Pt reports having difficulty cleaning self because of pain.  No pain inside rectum.  Reports had bowel movement in car.    Does not appear to have prolapsed rectum.

## 2019-06-05 NOTE — Discharge Instructions (Addendum)
Thank you for letting us take care of you in the emergency department today.   Please continue to take your regular, prescribed medications.   Please follow up with: - Your primary care doctor to review your ER visit and follow up on your symptoms.  - The surgeons (information below) to talk about potential management of your skin tags.  Please return to the ER for any new or worsening symptoms.

## 2019-06-13 ENCOUNTER — Telehealth: Payer: Self-pay | Admitting: General Surgery

## 2019-06-13 NOTE — Telephone Encounter (Signed)
Called patient to setup and an appointment to be seen by [name] per Dr. Dahlia Byes. Patient said he just started a new job and would have to give the office a call back on when he would be able to come in.

## 2019-06-14 ENCOUNTER — Other Ambulatory Visit: Payer: Self-pay

## 2019-06-14 ENCOUNTER — Encounter: Payer: Self-pay | Admitting: General Surgery

## 2019-06-14 ENCOUNTER — Ambulatory Visit (INDEPENDENT_AMBULATORY_CARE_PROVIDER_SITE_OTHER): Payer: Self-pay | Admitting: General Surgery

## 2019-06-14 VITALS — BP 127/85 | HR 107 | Temp 97.2°F | Ht 68.0 in | Wt 171.0 lb

## 2019-06-14 DIAGNOSIS — R159 Full incontinence of feces: Secondary | ICD-10-CM

## 2019-06-14 DIAGNOSIS — A630 Anogenital (venereal) warts: Secondary | ICD-10-CM

## 2019-06-14 NOTE — Patient Instructions (Signed)
We are referring you to [facility]. They will contact you to schedule this appointment. Their contact number is PH: [phone]   Follow-up with our office as needed.  Please call and ask to speak with a nurse if you develop questions or concerns.

## 2019-06-14 NOTE — Progress Notes (Signed)
A referral has been sent to [facility].  PH: [phone] Fax: [phone]  All notes, and lab results have been sent.

## 2019-06-14 NOTE — Progress Notes (Signed)
[name], male   DOB: [birth year], 63 y.o.   MRN: [id number]  Chief Complaint  Patient presents with  . New Patient (Initial Visit)    skin tag    HPI George Wade is a 64 y.o. male.  He was referred to general surgery by the emergency department.  He was recently seen there for "growths around his anus."  He has had frequent loose stool, including fecal incontinence, as well as some blood on the toilet tissue, for the past 5 to 10 days.  He also endorses nausea and vomiting for the past 10 to 15 days.  His recent medical history, is significantly more complicated than the ED note of August 25 would suggest.  He has been hospitalized several times in the past few months with respiratory failure and GI bleeding, as well as alcohol withdrawal syndrome.  His episodes are cataloged in the electronic medical record, including December 2019, February 2020, June 2020, and August 2020.  He is currently homeless, living out of his truck.  He states that he has had a difficult time finding employment, secondary to COVID-19.  He is not taking any of the prescribed medications on his list, due to inability to afford them.     Past Medical History:  Diagnosis Date  . Alcoholism with psychosis with complication, with hallucinations ([place])   . ED (erectile dysfunction)   . Fatigue   . History of pneumonia   . Hypercholesteremia   . Hypertension   . Lymphoma ([place]) 2008   non hodgkins lymphoma  . Rectal bleeding   . 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 ESOPHAGOGASTRODUODENOSCOPY N/A 05/18/2019   Procedure: ESOPHAGOGASTRODUODENOSCOPY (EGD);  Surgeon: Lin Landsman, MD;  Location: [facility] ENDOSCOPY;  Service: Gastroenterology;  Laterality: N/A;  . [place]  . JOINT REPLACEMENT    . REPLACEMENT TOTAL KNEE BILATERAL  2009  . TONSILLECTOMY  2008   with neck dissection     Family History   Problem Relation Age of Onset  . Cancer Mother   . Hypertension Father   . Cancer Paternal Grandmother     Social History Social History   Tobacco Use  . Smoking status: Never Smoker  . Smokeless tobacco: Never Used  Substance Use Topics  . Alcohol use: Yes    Alcohol/week: 5.0 standard drinks    Types: 5 Glasses of wine per week    Frequency: Never  . Drug use: No    Allergies  Allergen Reactions  . Sulfa Antibiotics Nausea Only    No current outpatient medications on file.   No current facility-administered medications for this visit.     Review of Systems Review of Systems  Constitutional: Positive for fatigue.  HENT: Positive for trouble swallowing.   Respiratory: Positive for cough.   Cardiovascular: Positive for leg swelling.  Gastrointestinal: Positive for blood in stool and rectal pain.       Fecal incontinence  All other systems reviewed and are negative.   Blood pressure 127/85, pulse (!) 107, temperature (!) 97.2 F (36.2 C), height 5\' 8"  (1.727 m), weight 171 lb (77.6 kg), SpO2 97 %.  Physical Exam Physical Exam Exam conducted with a chaperone present.  Constitutional:      General: He is not in acute distress.    Appearance: He is not toxic-appearing.     Comments: Disheveled appearance  HENT:     Head:     Comments: Scar on the left frontotemporal region secondary to prior trauma    Nose:     Comments: Covered with a mask secondary to COVID-19 precautions    Mouth/Throat:     Comments: Covered with a mask secondary to COVID 19 precautions Eyes:     General: No scleral icterus.       Right eye: No discharge.        Left eye: No discharge.  Neck:     Musculoskeletal: Normal range of motion.     Comments: Scar from prior neck dissection Cardiovascular:     Rate and Rhythm: Regular rhythm. Tachycardia present.     Pulses: Normal pulses.  Pulmonary:     Effort: Pulmonary effort is normal.     Breath sounds: Normal breath sounds.   Abdominal:     General: Bowel sounds are normal.     Palpations: Abdomen is soft.  Genitourinary:    Penis: Lesions present.          Comments: Condylomata present in the indicated locations; particularly extensive in the perianal region.  Soiling consistent with fecal incontinence. Musculoskeletal:        General: Swelling present.     Right lower leg: Edema present.     Comments: Onychomycosis of all pedal digits.  2+ nonpitting edema to the right lower extremity.  Scars overlying both knees, consistent with history of bilateral total knee replacements  Lymphadenopathy:     Cervical: No cervical adenopathy.  Skin:    General: Skin is warm.  Neurological:     General: No focal deficit present.     Mental Status: He is alert.  Psychiatric:        Mood and Affect: Mood normal.        Behavior: Behavior normal.     Data Reviewed Approximately 45 minutes of time were spent reviewing the extensive electronic medical record of his multiple hospitalizations and presentations over the past year.  These include both emergency department visits and hospitalizations for GI bleeding, alcohol withdrawal, pneumonia, rhabdomyolysis, and trauma.  Assessment and Plan This is a 63 year old man who was referred by the emergency department for "skin tags".  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  The extent of disease is such that I do not think he would respond well to topical agents, which he would not be able to afford anyway.  I think he would probably do best with surgical excision, however in light of his fecal incontinence and extensive disease, I think he would be better served by seeing a dedicated colon and rectal surgeon.  He would prefer to be seen at [facility], we have made an appropriate referral.  Greater than 50% of an hour's visit was spent in coordination of patient care and counseling.  We will see him on an as-needed basis.      Fredirick Maudlin 06/14/2019, 1:02 PM

## 2019-06-15 ENCOUNTER — Telehealth: Payer: Self-pay

## 2019-06-15 NOTE — Telephone Encounter (Signed)
Spoke with Nira Conn at [facility] GI/ Colorectal Surgery. Referral documents were received, no appointment has been made yet.

## 2019-06-28 ENCOUNTER — Telehealth: Payer: Self-pay | Admitting: General Surgery

## 2019-06-28 NOTE — Telephone Encounter (Signed)
Patient given directions to [facility] GI at [address] and their phone number.

## 2019-06-28 NOTE — Telephone Encounter (Signed)
Patient is calling said we were sending him to [facility], patient is asking for more information about where he is going, he said his appt was on the 21st. Please call patient and advise.

## 2019-07-17 ENCOUNTER — Encounter: Payer: Self-pay | Admitting: *Deleted

## 2019-07-17 ENCOUNTER — Emergency Department
Admission: EM | Admit: 2019-07-17 | Discharge: 2019-07-18 | Disposition: A | Discharge status: Home / Self Care | Payer: No Typology Code available for payment source | Attending: Emergency Medicine | Admitting: Emergency Medicine

## 2019-07-17 ENCOUNTER — Emergency Department: Payer: No Typology Code available for payment source

## 2019-07-17 ENCOUNTER — Other Ambulatory Visit: Payer: Self-pay

## 2019-07-17 DIAGNOSIS — I10 Essential (primary) hypertension: Secondary | ICD-10-CM | POA: Insufficient documentation

## 2019-07-17 DIAGNOSIS — Y93I9 Activity, other involving external motion: Secondary | ICD-10-CM | POA: Diagnosis not present

## 2019-07-17 DIAGNOSIS — Y999 Unspecified external cause status: Secondary | ICD-10-CM | POA: Diagnosis not present

## 2019-07-17 DIAGNOSIS — F10220 Alcohol dependence with intoxication, uncomplicated: Secondary | ICD-10-CM | POA: Insufficient documentation

## 2019-07-17 DIAGNOSIS — R519 Headache, unspecified: Secondary | ICD-10-CM | POA: Diagnosis not present

## 2019-07-17 DIAGNOSIS — Z20828 Contact with and (suspected) exposure to other viral communicable diseases: Secondary | ICD-10-CM | POA: Diagnosis not present

## 2019-07-17 DIAGNOSIS — Y908 Blood alcohol level of 240 mg/100 ml or more: Secondary | ICD-10-CM | POA: Diagnosis not present

## 2019-07-17 DIAGNOSIS — Y929 Unspecified place or not applicable: Secondary | ICD-10-CM | POA: Insufficient documentation

## 2019-07-17 LAB — CBC WITH DIFFERENTIAL/PLATELET
Abs Immature Granulocytes: 0.06 10*3/uL (ref 0.00–0.07)
Basophils Absolute: 0.1 10*3/uL (ref 0.0–0.1)
Basophils Relative: 1 %
Eosinophils Absolute: 0 10*3/uL (ref 0.0–0.5)
Eosinophils Relative: 0 %
HCT: 28.2 % — ABNORMAL LOW (ref 39.0–52.0)
Hemoglobin: 8 g/dL — ABNORMAL LOW (ref 13.0–17.0)
Immature Granulocytes: 0 %
Lymphocytes Relative: 3 %
Lymphs Abs: 0.4 10*3/uL — ABNORMAL LOW (ref 0.7–4.0)
MCH: 21.7 pg — ABNORMAL LOW (ref 26.0–34.0)
MCHC: 28.4 g/dL — ABNORMAL LOW (ref 30.0–36.0)
MCV: 76.6 fL — ABNORMAL LOW (ref 80.0–100.0)
Monocytes Absolute: 0.4 10*3/uL (ref 0.1–1.0)
Monocytes Relative: 3 %
Neutro Abs: 14.5 10*3/uL — ABNORMAL HIGH (ref 1.7–7.7)
Neutrophils Relative %: 93 %
Platelets: 351 10*3/uL (ref 150–400)
RBC: 3.68 MIL/uL — ABNORMAL LOW (ref 4.22–5.81)
RDW: 20.7 % — ABNORMAL HIGH (ref 11.5–15.5)
WBC: 15.5 10*3/uL — ABNORMAL HIGH (ref 4.0–10.5)
nRBC: 0 % (ref 0.0–0.2)

## 2019-07-17 LAB — COMPREHENSIVE METABOLIC PANEL
ALT: 60 U/L — ABNORMAL HIGH (ref 0–44)
AST: 95 U/L — ABNORMAL HIGH (ref 15–41)
Albumin: 3.1 g/dL — ABNORMAL LOW (ref 3.5–5.0)
Alkaline Phosphatase: 172 U/L — ABNORMAL HIGH (ref 38–126)
Anion gap: 17 — ABNORMAL HIGH (ref 5–15)
BUN: 7 mg/dL — ABNORMAL LOW (ref 8–23)
CO2: 20 mmol/L — ABNORMAL LOW (ref 22–32)
Calcium: 8.6 mg/dL — ABNORMAL LOW (ref 8.9–10.3)
Chloride: 103 mmol/L (ref 98–111)
Creatinine, Ser: 0.59 mg/dL — ABNORMAL LOW (ref 0.61–1.24)
GFR calc Af Amer: >60 mL/min (ref >60)
GFR calc non Af Amer: >60 mL/min (ref >60)
Glucose, Bld: 155 mg/dL — ABNORMAL HIGH (ref 70–99)
Potassium: 4.1 mmol/L (ref 3.5–5.1)
Sodium: 140 mmol/L (ref 135–145)
Total Bilirubin: 0.7 mg/dL (ref 0.3–1.2)
Total Protein: 6.3 g/dL — ABNORMAL LOW (ref 6.5–8.1)

## 2019-07-17 LAB — PROTIME-INR
INR: 1.8 — ABNORMAL HIGH (ref 0.8–1.2)
Prothrombin Time: 20.4 seconds — ABNORMAL HIGH (ref 11.4–15.2)

## 2019-07-17 LAB — ETHANOL: Alcohol, Ethyl (B): 263 mg/dL — ABNORMAL HIGH (ref <10)

## 2019-07-17 MED ORDER — SODIUM CHLORIDE 0.9 % IV BOLUS
1000.0000 mL | Freq: Once | INTRAVENOUS | Status: AC
  Administered 2019-07-17: 1000 mL via INTRAVENOUS
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
## 2019-07-17 NOTE — ED Notes (Signed)
Report off to vanessa rn 
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
## 2019-07-18 ENCOUNTER — Emergency Department: Payer: No Typology Code available for payment source

## 2019-07-18 DIAGNOSIS — F10220 Alcohol dependence with intoxication, uncomplicated: Secondary | ICD-10-CM | POA: Insufficient documentation

## 2019-07-18 LAB — URINALYSIS, COMPLETE (UACMP) WITH MICROSCOPIC
Bilirubin Urine: NEGATIVE
Glucose, UA: 150 mg/dL — AB
Ketones, ur: NEGATIVE mg/dL
Nitrite: NEGATIVE
Protein, ur: NEGATIVE mg/dL
Specific Gravity, Urine: 1.008 (ref 1.005–1.030)
pH: 7 (ref 5.0–8.0)

## 2019-07-18 LAB — SARS CORONAVIRUS 2 BY RT PCR (HOSPITAL ORDER, PERFORMED IN ~~LOC~~ HOSPITAL LAB): SARS Coronavirus 2: NEGATIVE

## 2019-07-18 LAB — CK: Total CK: 226 U/L (ref 49–397)

## 2019-07-18 MED ORDER — SODIUM CHLORIDE 0.9 % IV BOLUS
1000.0000 mL | Freq: Once | INTRAVENOUS | Status: AC
  Administered 2019-07-18: 04:00:00 1000 mL via INTRAVENOUS

## 2019-07-18 MED ORDER — LORAZEPAM 2 MG/ML IJ SOLN
0.0000 mg | Freq: Four times a day (QID) | INTRAMUSCULAR | Status: DC
  Administered 2019-07-18: 04:00:00 2 mg via INTRAVENOUS
  Filled 2019-07-18: qty 1

## 2019-07-18 MED ORDER — THIAMINE HCL 100 MG/ML IJ SOLN
100.0000 mg | Freq: Every day | INTRAMUSCULAR | Status: DC
  Administered 2019-07-18: 100 mg via INTRAVENOUS
  Filled 2019-07-18: qty 2

## 2019-07-18 MED ORDER — CHLORDIAZEPOXIDE HCL 10 MG PO CAPS: ORAL_CAPSULE | ORAL | 0 refills | Status: DC

## 2019-07-18 MED ORDER — ALUM & MAG HYDROXIDE-SIMETH 200-200-20 MG/5ML PO SUSP: 30.0000 mL | Freq: Four times a day (QID) | ORAL | Status: DC | PRN

## 2019-07-18 MED ORDER — LORAZEPAM 2 MG/ML IJ SOLN: 0.0000 mg | Freq: Two times a day (BID) | INTRAMUSCULAR | Status: DC

## 2019-07-18 MED ORDER — VITAMIN B-1 100 MG PO TABS: 100.0000 mg | ORAL_TABLET | Freq: Every day | ORAL | Status: DC

## 2019-07-18 MED ORDER — ONDANSETRON HCL 4 MG PO TABS: 4.0000 mg | ORAL_TABLET | Freq: Three times a day (TID) | ORAL | Status: DC | PRN

## 2019-07-18 MED ORDER — LORAZEPAM 2 MG PO TABS: 0.0000 mg | ORAL_TABLET | Freq: Two times a day (BID) | ORAL | Status: DC

## 2019-07-18 MED ORDER — LORAZEPAM 2 MG PO TABS
0.0000 mg | ORAL_TABLET | Freq: Four times a day (QID) | ORAL | Status: DC
  Administered 2019-07-18 (×2): 1 mg via ORAL
  Filled 2019-07-18 (×2): qty 1

## 2019-07-18 NOTE — ED Provider Notes (Signed)
-----------------------------------------   5:53 PM on 07/18/2019 -----------------------------------------  Patient has been refused by detox facilities for various reasons.  Patient states he believes he would be okay going home and could follow-up as an outpatient.  I discussed placing the patient on Librium which she would like to try.  We will attempt to get a taxi voucher for the patient to get back to his vehicle.  Patient agreeable to this plan of care.   Harvest Dark, MD 07/18/19 1753

## 2019-07-18 NOTE — ED Notes (Signed)
Pt states he has "something to confess" to psych and wants to see previous doc again. Will notify attending.

## 2019-07-18 NOTE — ED Notes (Signed)
Pt still at MRI

## 2019-07-18 NOTE — ED Notes (Signed)
Pt given juice as requested. Verbal okay from EDP.
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
## 2019-07-18 NOTE — BH Assessment (Signed)
TTS faxed patient's information to [facility]. After following up with facilities, pt has been denied due to "medical acuity" per [place] and "no beds available" per [facility].  TTS spoke with patient regarding outpatient treatment. Patient states he would like to get back to his car in the parking lot of [place]. He reports having a list of resources, his laptop, and a pair of shoes in his car. Patient states he would rather have outpatient resources to address his alcohol use.  This information was relayed to EDP - Dr. Kerman Passey.

## 2019-07-18 NOTE — ED Notes (Signed)
Both pt and MD okay with d/c. PT has been ambulatory in room multiple times.

## 2019-07-18 NOTE — ED Notes (Signed)
Pt provided with a meal tray and PO fluids

## 2019-07-18 NOTE — ED Notes (Signed)
Pt called out stating he was choking. This RN entered room; pt talking. Pt's fast red and vomit coming from pt's mouth. Pt assisted to a 90 degree sitting position and suction initiated. This RN stayed at bedside until pt returned to baseline. MD made aware. Pt reports recurrent issues with poor/inability to swallow.
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
## 2019-07-18 NOTE — ED Notes (Signed)
Pt given phone to be screened for MRI

## 2019-07-18 NOTE — ED Notes (Signed)
Pt given food tray and juice as requested.

## 2019-07-18 NOTE — ED Notes (Signed)
Pt BAL is significantly elevated ( BAL 263). Pt will be assessed when she is no longer intoxicated.

## 2019-07-18 NOTE — ED Provider Notes (Signed)
Procedures     ----------------------------------------- 1:09 PM on 07/18/2019 -----------------------------------------  Remains stable, on CIWA protocol.  Had reported some dysphasia so MRI was obtained which is unremarkable.  Psychiatry consult plans for referral to inpatient substance abuse treatment.    Carrie Mew, MD 07/18/19 1310

## 2019-07-18 NOTE — ED Notes (Signed)
Pt back from MRI 

## 2019-07-18 NOTE — ED Notes (Signed)
Pt spit up white/yellow phlegm. Pt's urine yellow with thick white clots in it. Sample sent to lab.

## 2019-07-18 NOTE — ED Notes (Signed)
Pt leaving for MRI.  

## 2019-07-18 NOTE — Progress Notes (Signed)
The patient's Ethanol level is significantly elevated ( BAL, 263). The patient will be assessed when he is no longer intoxicated and fully participates in the assessment process.

## 2019-07-18 NOTE — ED Provider Notes (Signed)
-----------------------------------------   1:49 AM on 07/18/2019 -----------------------------------------  CT head interpreted per Dr. Westley Chandler: No acute intracranial abnormality. Findings consistent with age related atrophy and chronic small vessel ischemia   CT cervical spine interpreted per Dr. Collins Scotland: No acute fracture or static subluxation of the cervical spine.  Patient awaiting psychiatric evaluation and disposition.   ----------------------------------------- 6:35 AM on 07/18/2019 -----------------------------------------  Tachycardia improved.  Patient resting no acute distress.  Psychiatry was unable to interview patient overnight secondary to intoxication.  They will reattempt to assess him once he is awake and sober.  Will transfer care to the oncoming provider.   Paulette Blanch, MD 07/18/19 [phone]

## 2019-07-28 ENCOUNTER — Emergency Department: Payer: Self-pay

## 2019-07-28 ENCOUNTER — Inpatient Hospital Stay
Admission: EM | Admit: 2019-07-28 | Discharge: 2019-08-29 | DRG: 175 | Disposition: A | Discharge status: Home / Self Care | Payer: Self-pay | Attending: Internal Medicine | Admitting: Internal Medicine

## 2019-07-28 ENCOUNTER — Encounter: Payer: Self-pay | Admitting: Emergency Medicine

## 2019-07-28 ENCOUNTER — Other Ambulatory Visit: Payer: Self-pay

## 2019-07-28 DIAGNOSIS — K3189 Other diseases of stomach and duodenum: Secondary | ICD-10-CM | POA: Diagnosis present

## 2019-07-28 DIAGNOSIS — S025XXA Fracture of tooth (traumatic), initial encounter for closed fracture: Secondary | ICD-10-CM | POA: Diagnosis not present

## 2019-07-28 DIAGNOSIS — J690 Pneumonitis due to inhalation of food and vomit: Secondary | ICD-10-CM | POA: Diagnosis not present

## 2019-07-28 DIAGNOSIS — Z96653 Presence of artificial knee joint, bilateral: Secondary | ICD-10-CM | POA: Diagnosis present

## 2019-07-28 DIAGNOSIS — K222 Esophageal obstruction: Secondary | ICD-10-CM

## 2019-07-28 DIAGNOSIS — F1094 Alcohol use, unspecified with alcohol-induced mood disorder: Secondary | ICD-10-CM | POA: Diagnosis present

## 2019-07-28 DIAGNOSIS — S01512A Laceration without foreign body of oral cavity, initial encounter: Secondary | ICD-10-CM | POA: Diagnosis not present

## 2019-07-28 DIAGNOSIS — I10 Essential (primary) hypertension: Secondary | ICD-10-CM | POA: Diagnosis present

## 2019-07-28 DIAGNOSIS — E440 Moderate protein-calorie malnutrition: Secondary | ICD-10-CM | POA: Diagnosis present

## 2019-07-28 DIAGNOSIS — R059 Cough, unspecified: Secondary | ICD-10-CM

## 2019-07-28 DIAGNOSIS — K209 Esophagitis, unspecified without bleeding: Secondary | ICD-10-CM | POA: Diagnosis present

## 2019-07-28 DIAGNOSIS — W19XXXA Unspecified fall, initial encounter: Secondary | ICD-10-CM | POA: Diagnosis present

## 2019-07-28 DIAGNOSIS — E512 Wernicke's encephalopathy: Secondary | ICD-10-CM | POA: Diagnosis present

## 2019-07-28 DIAGNOSIS — Z590 Homelessness: Secondary | ICD-10-CM

## 2019-07-28 DIAGNOSIS — Z8249 Family history of ischemic heart disease and other diseases of the circulatory system: Secondary | ICD-10-CM

## 2019-07-28 DIAGNOSIS — T380X5A Adverse effect of glucocorticoids and synthetic analogues, initial encounter: Secondary | ICD-10-CM | POA: Diagnosis not present

## 2019-07-28 DIAGNOSIS — R140 Abdominal distension (gaseous): Secondary | ICD-10-CM

## 2019-07-28 DIAGNOSIS — T17908A Unspecified foreign body in respiratory tract, part unspecified causing other injury, initial encounter: Secondary | ICD-10-CM | POA: Diagnosis not present

## 2019-07-28 DIAGNOSIS — D509 Iron deficiency anemia, unspecified: Secondary | ICD-10-CM | POA: Diagnosis present

## 2019-07-28 DIAGNOSIS — K6289 Other specified diseases of anus and rectum: Secondary | ICD-10-CM | POA: Diagnosis present

## 2019-07-28 DIAGNOSIS — I2699 Other pulmonary embolism without acute cor pulmonale: Principal | ICD-10-CM | POA: Diagnosis present

## 2019-07-28 DIAGNOSIS — F10231 Alcohol dependence with withdrawal delirium: Secondary | ICD-10-CM | POA: Diagnosis present

## 2019-07-28 DIAGNOSIS — Z85818 Personal history of malignant neoplasm of other sites of lip, oral cavity, and pharynx: Secondary | ICD-10-CM

## 2019-07-28 DIAGNOSIS — Z20828 Contact with and (suspected) exposure to other viral communicable diseases: Secondary | ICD-10-CM | POA: Diagnosis present

## 2019-07-28 DIAGNOSIS — R7989 Other specified abnormal findings of blood chemistry: Secondary | ICD-10-CM | POA: Diagnosis not present

## 2019-07-28 DIAGNOSIS — R1310 Dysphagia, unspecified: Secondary | ICD-10-CM

## 2019-07-28 DIAGNOSIS — K625 Hemorrhage of anus and rectum: Secondary | ICD-10-CM | POA: Diagnosis present

## 2019-07-28 DIAGNOSIS — R05 Cough: Secondary | ICD-10-CM

## 2019-07-28 DIAGNOSIS — A630 Anogenital (venereal) warts: Secondary | ICD-10-CM

## 2019-07-28 DIAGNOSIS — E876 Hypokalemia: Secondary | ICD-10-CM | POA: Diagnosis not present

## 2019-07-28 DIAGNOSIS — J9602 Acute respiratory failure with hypercapnia: Secondary | ICD-10-CM | POA: Diagnosis not present

## 2019-07-28 DIAGNOSIS — Z8572 Personal history of non-Hodgkin lymphomas: Secondary | ICD-10-CM

## 2019-07-28 DIAGNOSIS — Y92230 Patient room in hospital as the place of occurrence of the external cause: Secondary | ICD-10-CM | POA: Diagnosis not present

## 2019-07-28 DIAGNOSIS — R112 Nausea with vomiting, unspecified: Secondary | ICD-10-CM | POA: Diagnosis present

## 2019-07-28 DIAGNOSIS — R918 Other nonspecific abnormal finding of lung field: Secondary | ICD-10-CM

## 2019-07-28 DIAGNOSIS — L03114 Cellulitis of left upper limb: Secondary | ICD-10-CM | POA: Diagnosis not present

## 2019-07-28 DIAGNOSIS — W1830XA Fall on same level, unspecified, initial encounter: Secondary | ICD-10-CM | POA: Diagnosis not present

## 2019-07-28 DIAGNOSIS — S01521A Laceration with foreign body of lip, initial encounter: Secondary | ICD-10-CM | POA: Diagnosis not present

## 2019-07-28 DIAGNOSIS — F10220 Alcohol dependence with intoxication, uncomplicated: Secondary | ICD-10-CM | POA: Diagnosis present

## 2019-07-28 DIAGNOSIS — F1024 Alcohol dependence with alcohol-induced mood disorder: Secondary | ICD-10-CM | POA: Diagnosis present

## 2019-07-28 DIAGNOSIS — Z8701 Personal history of pneumonia (recurrent): Secondary | ICD-10-CM

## 2019-07-28 DIAGNOSIS — F17200 Nicotine dependence, unspecified, uncomplicated: Secondary | ICD-10-CM | POA: Diagnosis present

## 2019-07-28 DIAGNOSIS — J9601 Acute respiratory failure with hypoxia: Secondary | ICD-10-CM | POA: Diagnosis not present

## 2019-07-28 DIAGNOSIS — E785 Hyperlipidemia, unspecified: Secondary | ICD-10-CM | POA: Diagnosis present

## 2019-07-28 DIAGNOSIS — Z931 Gastrostomy status: Secondary | ICD-10-CM

## 2019-07-28 LAB — HEPATIC FUNCTION PANEL
ALT: 75 U/L — ABNORMAL HIGH (ref 0–44)
AST: 114 U/L — ABNORMAL HIGH (ref 15–41)
Albumin: 3.2 g/dL — ABNORMAL LOW (ref 3.5–5.0)
Alkaline Phosphatase: 201 U/L — ABNORMAL HIGH (ref 38–126)
Bilirubin, Direct: 0.3 mg/dL — ABNORMAL HIGH (ref 0.0–0.2)
Indirect Bilirubin: 0.8 mg/dL (ref 0.3–0.9)
Total Bilirubin: 1.1 mg/dL (ref 0.3–1.2)
Total Protein: 6.7 g/dL (ref 6.5–8.1)

## 2019-07-28 LAB — BASIC METABOLIC PANEL
Anion gap: 16 — ABNORMAL HIGH (ref 5–15)
BUN: 6 mg/dL — ABNORMAL LOW (ref 8–23)
CO2: 22 mmol/L (ref 22–32)
Calcium: 8.6 mg/dL — ABNORMAL LOW (ref 8.9–10.3)
Chloride: 103 mmol/L (ref 98–111)
Creatinine, Ser: 0.57 mg/dL — ABNORMAL LOW (ref 0.61–1.24)
GFR calc Af Amer: >60 mL/min (ref >60)
GFR calc non Af Amer: >60 mL/min (ref >60)
Glucose, Bld: 140 mg/dL — ABNORMAL HIGH (ref 70–99)
Potassium: 3.2 mmol/L — ABNORMAL LOW (ref 3.5–5.1)
Sodium: 141 mmol/L (ref 135–145)

## 2019-07-28 LAB — CBC
HCT: 32.2 % — ABNORMAL LOW (ref 39.0–52.0)
Hemoglobin: 9.2 g/dL — ABNORMAL LOW (ref 13.0–17.0)
MCH: 22.2 pg — ABNORMAL LOW (ref 26.0–34.0)
MCHC: 28.6 g/dL — ABNORMAL LOW (ref 30.0–36.0)
MCV: 77.6 fL — ABNORMAL LOW (ref 80.0–100.0)
Platelets: 299 10*3/uL (ref 150–400)
RBC: 4.15 MIL/uL — ABNORMAL LOW (ref 4.22–5.81)
RDW: 24.5 % — ABNORMAL HIGH (ref 11.5–15.5)
WBC: 8.6 10*3/uL (ref 4.0–10.5)
nRBC: 0 % (ref 0.0–0.2)

## 2019-07-28 LAB — FIBRIN DERIVATIVES D-DIMER (ARMC ONLY): Fibrin derivatives D-dimer (ARMC): 1400.27 ng/mL (FEU) — ABNORMAL HIGH (ref 0.00–499.00)

## 2019-07-28 LAB — APTT: aPTT: 132 seconds — ABNORMAL HIGH (ref 24–36)

## 2019-07-28 LAB — SARS CORONAVIRUS 2 (TAT 6-24 HRS): SARS Coronavirus 2: NEGATIVE

## 2019-07-28 LAB — PROTIME-INR
INR: 1.3 — ABNORMAL HIGH (ref 0.8–1.2)
Prothrombin Time: 16.1 seconds — ABNORMAL HIGH (ref 11.4–15.2)

## 2019-07-28 LAB — BRAIN NATRIURETIC PEPTIDE: B Natriuretic Peptide: 79 pg/mL (ref 0.0–100.0)

## 2019-07-28 LAB — TROPONIN I (HIGH SENSITIVITY)
Troponin I (High Sensitivity): 5 ng/L (ref <18)
Troponin I (High Sensitivity): 7 ng/L (ref <18)

## 2019-07-28 LAB — MAGNESIUM: Magnesium: 2 mg/dL (ref 1.7–2.4)

## 2019-07-28 LAB — HEPARIN LEVEL (UNFRACTIONATED): Heparin Unfractionated: 0.3 IU/mL (ref 0.30–0.70)

## 2019-07-28 MED ORDER — HYDROXYZINE HCL 25 MG PO TABS
25.0000 mg | ORAL_TABLET | Freq: Four times a day (QID) | ORAL | Status: AC | PRN
  Filled 2019-07-28: qty 1

## 2019-07-28 MED ORDER — SODIUM CHLORIDE 0.9% FLUSH: 3.0000 mL | INTRAVENOUS | Status: DC | PRN

## 2019-07-28 MED ORDER — HEPARIN SODIUM (PORCINE) 5000 UNIT/ML IJ SOLN
4000.0000 [IU] | Freq: Once | INTRAMUSCULAR | Status: AC
  Administered 2019-07-28: 4000 [IU] via INTRAVENOUS

## 2019-07-28 MED ORDER — ONDANSETRON HCL 4 MG/2ML IJ SOLN
4.0000 mg | Freq: Once | INTRAMUSCULAR | Status: AC
  Administered 2019-07-28: 4 mg via INTRAVENOUS
  Filled 2019-07-28: qty 2

## 2019-07-28 MED ORDER — ACETAMINOPHEN 650 MG RE SUPP: 650.0000 mg | Freq: Four times a day (QID) | RECTAL | Status: DC | PRN

## 2019-07-28 MED ORDER — THIAMINE HCL 100 MG/ML IJ SOLN: 100.0000 mg | Freq: Once | INTRAMUSCULAR | Status: DC

## 2019-07-28 MED ORDER — HEPARIN (PORCINE) 25000 UT/250ML-% IV SOLN
1350.0000 [IU]/h | INTRAVENOUS | Status: DC
  Administered 2019-07-28: 1250 [IU]/h via INTRAVENOUS
  Filled 2019-07-28: qty 250

## 2019-07-28 MED ORDER — VITAMIN B-1 100 MG PO TABS
100.0000 mg | ORAL_TABLET | Freq: Every day | ORAL | Status: DC
  Administered 2019-07-29 – 2019-07-31 (×2): 100 mg via ORAL
  Filled 2019-07-28 (×4): qty 1

## 2019-07-28 MED ORDER — LORAZEPAM 1 MG PO TABS
1.0000 mg | ORAL_TABLET | Freq: Four times a day (QID) | ORAL | Status: AC
  Administered 2019-07-28 – 2019-07-29 (×4): 1 mg via ORAL
  Filled 2019-07-28 (×4): qty 1

## 2019-07-28 MED ORDER — BISACODYL 5 MG PO TBEC: 5.0000 mg | DELAYED_RELEASE_TABLET | Freq: Every day | ORAL | Status: DC | PRN

## 2019-07-28 MED ORDER — HYDROCODONE-ACETAMINOPHEN 5-325 MG PO TABS
1.0000 | ORAL_TABLET | ORAL | Status: DC | PRN
  Administered 2019-07-29: 1 via ORAL
  Administered 2019-08-10 (×2): 2 via ORAL
  Filled 2019-07-28: qty 2
  Filled 2019-07-28: qty 1
  Filled 2019-07-28: qty 2
  Filled 2019-07-28: qty 1
  Filled 2019-07-28: qty 2

## 2019-07-28 MED ORDER — SODIUM CHLORIDE 0.9% FLUSH
3.0000 mL | Freq: Two times a day (BID) | INTRAVENOUS | Status: DC
  Administered 2019-07-28 – 2019-08-29 (×56): 3 mL via INTRAVENOUS

## 2019-07-28 MED ORDER — SODIUM CHLORIDE 0.9 % IV SOLN
250.0000 mL | INTRAVENOUS | Status: DC | PRN
  Administered 2019-07-29 – 2019-08-19 (×2): 250 mL via INTRAVENOUS

## 2019-07-28 MED ORDER — ACETAMINOPHEN 325 MG PO TABS
650.0000 mg | ORAL_TABLET | Freq: Four times a day (QID) | ORAL | Status: DC | PRN
  Administered 2019-08-28: 12:00:00 650 mg via ORAL
  Filled 2019-07-28: qty 2

## 2019-07-28 MED ORDER — SODIUM CHLORIDE 0.9 % IV SOLN
8.0000 mg | Freq: Once | INTRAVENOUS | Status: DC
  Filled 2019-07-28: qty 4

## 2019-07-28 MED ORDER — LORAZEPAM 1 MG PO TABS
1.0000 mg | ORAL_TABLET | Freq: Four times a day (QID) | ORAL | Status: DC | PRN
  Filled 2019-07-28: qty 1

## 2019-07-28 MED ORDER — POTASSIUM CHLORIDE CRYS ER 20 MEQ PO TBCR
40.0000 meq | EXTENDED_RELEASE_TABLET | Freq: Once | ORAL | Status: DC
  Filled 2019-07-28: qty 2

## 2019-07-28 MED ORDER — LORAZEPAM 1 MG PO TABS: 1.0000 mg | ORAL_TABLET | Freq: Three times a day (TID) | ORAL | Status: DC

## 2019-07-28 MED ORDER — HEPARIN (PORCINE) 25000 UT/250ML-% IV SOLN: 10.0000 [IU]/kg/h | INTRAVENOUS | Status: DC

## 2019-07-28 MED ORDER — ALBUTEROL SULFATE (2.5 MG/3ML) 0.083% IN NEBU
2.5000 mg | INHALATION_SOLUTION | RESPIRATORY_TRACT | Status: DC | PRN
  Administered 2019-08-04: 10:00:00 2.5 mg via RESPIRATORY_TRACT
  Filled 2019-07-28: qty 3

## 2019-07-28 MED ORDER — ONDANSETRON HCL 4 MG/2ML IJ SOLN
4.0000 mg | Freq: Four times a day (QID) | INTRAMUSCULAR | Status: DC | PRN
  Administered 2019-07-30 – 2019-08-28 (×10): 4 mg via INTRAVENOUS
  Filled 2019-07-28 (×9): qty 2

## 2019-07-28 MED ORDER — LOPERAMIDE HCL 2 MG PO CAPS: 2.0000 mg | ORAL_CAPSULE | ORAL | Status: AC | PRN

## 2019-07-28 MED ORDER — ONDANSETRON HCL 4 MG PO TABS: 4.0000 mg | ORAL_TABLET | Freq: Four times a day (QID) | ORAL | Status: DC | PRN

## 2019-07-28 MED ORDER — SODIUM CHLORIDE 0.9% FLUSH: 3.0000 mL | Freq: Once | INTRAVENOUS | Status: DC

## 2019-07-28 MED ORDER — LORAZEPAM 1 MG PO TABS: 1.0000 mg | ORAL_TABLET | Freq: Every day | ORAL | Status: DC

## 2019-07-28 MED ORDER — ONDANSETRON 4 MG PO TBDP
4.0000 mg | ORAL_TABLET | Freq: Four times a day (QID) | ORAL | Status: AC | PRN
  Filled 2019-07-28: qty 1

## 2019-07-28 MED ORDER — ADULT MULTIVITAMIN W/MINERALS CH
1.0000 | ORAL_TABLET | Freq: Every day | ORAL | Status: DC
  Administered 2019-07-29 – 2019-07-31 (×3): 1 via ORAL
  Filled 2019-07-28 (×3): qty 1

## 2019-07-28 MED ORDER — ONDANSETRON 4 MG PO TBDP
4.0000 mg | ORAL_TABLET | Freq: Once | ORAL | Status: AC
  Administered 2019-07-28: 13:00:00 4 mg via ORAL
  Filled 2019-07-28: qty 1

## 2019-07-28 MED ORDER — LORAZEPAM 1 MG PO TABS: 1.0000 mg | ORAL_TABLET | Freq: Two times a day (BID) | ORAL | Status: DC

## 2019-07-28 MED ORDER — IOHEXOL 350 MG/ML SOLN
75.0000 mL | Freq: Once | INTRAVENOUS | Status: AC | PRN
  Administered 2019-07-28: 16:00:00 75 mL via INTRAVENOUS

## 2019-07-28 MED ORDER — SENNOSIDES-DOCUSATE SODIUM 8.6-50 MG PO TABS: 1.0000 | ORAL_TABLET | Freq: Every evening | ORAL | Status: DC | PRN

## 2019-07-28 NOTE — ED Notes (Signed)
Patient left via w/c with radiology for ABD x-ray and CT scan, patient is calm and cooperative.

## 2019-07-28 NOTE — ED Notes (Signed)
Patient is going to transfer to room 2 for medical treatment per Dr. Cinda Quest and charge nurse Theadora Rama RN.

## 2019-07-28 NOTE — ED Notes (Signed)
Patient returned from X-ray and CT scan, Patient is back in bed resting, no complaints.

## 2019-07-28 NOTE — Consult Note (Signed)
ANTICOAGULATION CONSULT NOTE - Initial Consult  Pharmacy Consult for Heparin Drip Indication: pulmonary embolus  Allergies  Allergen Reactions  . Sulfa Antibiotics Nausea Only    Patient Measurements: Height: 5\' 8"  (172.7 cm) Weight: 170 lb (77.1 kg) IBW/kg (Calculated) : 68.4 Heparin Dosing Weight: 77.1kg  Vital Signs: Temp: 98.8 F (37.1 C) (10/17 1040) Temp Source: Oral (10/17 1040) BP: 138/96 (10/17 1040) Pulse Rate: 114 (10/17 1040)  Labs: Recent Labs    07/28/19 1040 07/28/19 1319  HGB 9.2*  --   HCT 32.2*  --   PLT 299  --   CREATININE 0.57*  --   TROPONINIHS 5 7    Estimated Creatinine Clearance: 92.6 mL/min (A) (by C-G formula based on SCr of 0.57 mg/dL (L)).   Medical History: Past Medical History:  Diagnosis Date  . Alcoholism with psychosis with complication, with hallucinations ([place])   . ED (erectile dysfunction)   . Fatigue   . History of pneumonia   . Hypercholesteremia   . Hypertension   . Lymphoma ([place]) 2008   non hodgkins lymphoma  . Rectal bleeding   . 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  Assessment: 63 y.o. male patient complains of cough and chest pain for 2 weeks. Cough started first and then the chest pain afterwards. Patient's CT comes back read as pulmonary emboli.  Pharmacy has been consulted to monitor/initiate heparin drip.    Pertinent hx includes EtOH abuse, HLD, and previous rectal bleed.    INR and APTT pending.   Goal of Therapy:  Heparin level 0.3-0.7 units/ml Monitor platelets by anticoagulation protocol: Yes   Plan:  Give 4000 units bolus x 1, followed by 1250 units/hr.  Will recheck heparin level (HL) in 6 hours per protocol.  Will follow CBC daily per protocol while on Heparin drip.  Lu Duffel, PharmD, BCPS Clinical Pharmacist 07/28/2019 5:17 PM

## 2019-07-28 NOTE — Progress Notes (Signed)
Patient requested not to be given any medications this PM and reschedule until am due to nausea.

## 2019-07-28 NOTE — ED Notes (Signed)
Patient went for his chest -xray, he is cooperative, and calm, will continue to monitor. Patient complained of being cold and was given warm blanket, will continue to monitor.
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
## 2019-07-28 NOTE — H&P (Signed)
[facility] NAME: George Wade    MR#:  [id number]  DATE OF BIRTH:  July 16, 1956  DATE OF ADMISSION:  07/28/2019  PRIMARY CARE PHYSICIAN: Patient, [name] Per   REQUESTING/REFERRING PHYSICIAN: [name].  CHIEF COMPLAINT:   Chief Complaint  Patient presents with   Cough   Chest Pain   Chest pain and the cough 2 weeks. HISTORY OF PRESENT ILLNESS:  George Wade  is a 63 y.o. male with a known history of new medical problems as below. The patient presents the ED with above chief complaints.  The patient presents the ED with above chief complaints.  He has had chest pain in the central area, which is extirpated by cough and swallowing food.  He also complains of shortness breath and cough.  He has been drinking alcohol on daily basis.  The last drink was yesterday.  He denies any tremors or shaking.  He is found elevated d-dimer and CT angiogram of the chest report right-sided PE.  [name] requested admission and will start heparin drip. 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[place])     PAST SURGICAL HISTORY:   Past Surgical History:  Procedure Laterality Date   ESOPHAGOGASTRODUODENOSCOPY N/A 05/18/2019   Procedure: ESOPHAGOGASTRODUODENOSCOPY (EGD);  Surgeon: Lin Landsman, MD;  Location: [facility] ENDOSCOPY;  Service: Gastroenterology;  Laterality: N/A;   HERNIA REPAIR  1958   JOINT REPLACEMENT     REPLACEMENT TOTAL KNEE BILATERAL  2009   TONSILLECTOMY  2008   with neck dissection     SOCIAL HISTORY:   Social History   Tobacco Use   Smoking status: Never Smoker    Smokeless tobacco: Never Used  Substance Use Topics   Alcohol use: Yes    Alcohol/week: 5.0 standard drinks    Types: 5 Glasses of wine per week    Frequency: Never    FAMILY HISTORY:   Family History  Problem Relation Age of Onset   Cancer Mother    Hypertension Father    Cancer Paternal Grandmother     DRUG ALLERGIES:   Allergies  Allergen Reactions   Sulfa Antibiotics Nausea Only    REVIEW OF SYSTEMS:   Review of Systems  Constitutional: Negative for chills, fever and malaise/fatigue.  HENT: Negative for sore throat.   Eyes: Negative for blurred vision and double vision.  Respiratory: Positive for cough and shortness of breath. Negative for hemoptysis, wheezing and stridor.   Cardiovascular: Positive for chest pain. Negative for palpitations, orthopnea and leg swelling.  Gastrointestinal: Negative for abdominal pain, blood in stool, diarrhea, melena, nausea and vomiting.  Genitourinary: Negative for dysuria, flank pain and hematuria.       Chest pain while swallow.  Musculoskeletal: Negative for back pain and joint pain.  Skin: Negative for rash.  Neurological: Negative for dizziness, sensory change, focal weakness, seizures, loss of consciousness, weakness and headaches.  Endo/Heme/Allergies: Negative for polydipsia.  Psychiatric/Behavioral: Negative for depression. The patient is not nervous/anxious.     MEDICATIONS AT HOME:   Prior to Admission medications  Medication Sig Start Date End Date Taking? Authorizing Provider  chlordiazePOXIDE (LIBRIUM) 10 MG capsule Day 1-2: Take 1 tablet PO Q6H Day 3-4: Take 1 tablet PO Q8H Day 5-6: Take 1 tablet PO Q12H Day 7-8: Take 1 tablet Daily. 07/18/19   Harvest Dark, MD      VITAL SIGNS:  Blood pressure (!) 138/96, pulse (!) 114, temperature 98.8 F (37.1 C), temperature source Oral, resp. rate 18, height 5\' 8"  (1.727 m), weight 77.1 kg, SpO2 92 %.  PHYSICAL EXAMINATION:  Physical Exam  GENERAL:  63  y.o.-year-old patient lying in the bed with no acute distress.  EYES: Pupils equal, round, reactive to light and accommodation. No scleral icterus. Extraocular muscles intact.  HEENT: Head atraumatic, normocephalic.  NECK:  Supple, no jugular venous distention. No thyroid enlargement, no tenderness.  LUNGS: Normal breath sounds bilaterally, no wheezing, rales,rhonchi or crepitation. No use of accessory muscles of respiration.  CARDIOVASCULAR: S1, S2 normal. No murmurs, rubs, or gallops.  ABDOMEN: Soft, nontender, nondistended. Bowel sounds present. No organomegaly or mass.  EXTREMITIES: No pedal edema, cyanosis, or clubbing.  No calf tenderness. NEUROLOGIC: Cranial nerves II through XII are intact. Muscle strength 5/5 in all extremities. Sensation intact. Gait not checked.  PSYCHIATRIC: The patient is alert and oriented x 3.  SKIN: No obvious rash, lesion, or ulcer.   LABORATORY PANEL:   CBC Recent Labs  Lab 07/28/19 1040  WBC 8.6  HGB 9.2*  HCT 32.2*  PLT 299   ------------------------------------------------------------------------------------------------------------------  Chemistries  Recent Labs  Lab 07/28/19 1040  NA 141  K 3.2*  CL 103  CO2 22  GLUCOSE 140*  BUN 6*  CREATININE 0.57*  CALCIUM 8.6*  AST 114*  ALT 75*  ALKPHOS 201*  BILITOT 1.1   ------------------------------------------------------------------------------------------------------------------  Cardiac Enzymes No results for input(s): TROPONINI in the last 168 hours. ------------------------------------------------------------------------------------------------------------------  RADIOLOGY:  Dg Chest 2 View  Result Date: 07/28/2019 CLINICAL DATA:  Cough and chest pain for 2 weeks. EXAM: CHEST - 2 VIEW COMPARISON:  July 17, 2019 FINDINGS: The heart size and mediastinal contours are within normal limits. Both lungs are clear. The visualized skeletal structures are unremarkable. IMPRESSION: No  active cardiopulmonary disease. Electronically Signed   By: Dorise Bullion III M.D   On: 07/28/2019 12:18   Ct Angio Chest Pe W And/or Wo Contrast  Result Date: 07/28/2019 CLINICAL DATA:  63 year old male with cough and chest pain for 2 weeks. EXAM: CT ANGIOGRAPHY CHEST WITH CONTRAST TECHNIQUE: Multidetector CT imaging of the chest was performed using the standard protocol during bolus administration of intravenous contrast. Multiplanar CT image reconstructions and MIPs were obtained to evaluate the vascular anatomy. CONTRAST:  23mL OMNIPAQUE IOHEXOL 350 MG/ML SOLN COMPARISON:  Chest radiographs earlier today. CT Chest, Abdomen, and Pelvis 05/15/2019 FINDINGS: Cardiovascular: Adequate contrast bolus timing in the pulmonary arterial tree. Intermittent respiratory motion, severe just below the hila and affecting much of the lower lobe pulmonary artery detail. However, there is a suspicious appearance of the right lower lobe branches on series 5, image 149, which continues into image 161. And also nonocclusive thrombus visible in the right upper lobe branch on image 105 which is relatively motion free. Calcified coronary artery atherosclerosis. Negative visible aorta aside from mild atherosclerosis. Stable cardiac size, no cardiomegaly. No pericardial effusion. Mediastinum/Nodes: Mediastinal lymph node size appears stable to slightly decreased from August. Persistent circumferential thickening of the esophagus, without progression. Lungs/Pleura: Intermittent motion artifact. Stable lung volumes. No convincing abnormal pulmonary opacity today. And  no pleural effusion is evident. Upper Abdomen: Severe hepatic steatosis. Thickened appearance of the gastroesophageal junction (see esophagus findings above the). Otherwise negative visible upper abdomen. Musculoskeletal: Chronic left rib fractures. Chronic right posterior 10th rib fracture. Thoracic vertebrae appear stable. Motion artifact at the sternum. No acute or  suspicious osseous lesion identified. Review of the MIP images confirms the above findings. IMPRESSION: 1. Motion degraded study but positive for acute pulmonary emboli in the right lung. No associated pleural effusion or abnormal pulmonary opacity. 2. Persistent abnormal thickening of the esophagus, stable since August. This is nonspecific but esophagitis seems more likely than esophageal neoplasm now. 3. Stable to slightly decreased mediastinal lymph node size since August. 4. Calcified coronary artery atherosclerosis. 5. Severe hepatic steatosis. Electronically Signed   By: Genevie Ann M.D.   On: 07/28/2019 16:13   US Abdomen Limited  Result Date: 07/28/2019 CLINICAL DATA:  63 year old male with history of elevated liver function tests. EXAM: ULTRASOUND ABDOMEN LIMITED RIGHT UPPER QUADRANT COMPARISON:  No priors. FINDINGS: Gallbladder: No gallstones or wall thickening visualized. No sonographic Murphy sign noted by sonographer. Common bile duct: Diameter: 6 mm Liver: No focal lesion identified. Increased echogenicity throughout the hepatic parenchyma, indicative of hepatic steatosis. Portal vein is patent on color Doppler imaging with normal direction of blood flow towards the liver. Other: None. IMPRESSION: 1. No acute findings. Specifically, no cholelithiasis or evidence to suggest acute cholecystitis at this time. 2. Diffuse increased echogenicity throughout the hepatic parenchyma, indicative of hepatic steatosis. Electronically Signed   By: Vinnie Langton M.D.   On: 07/28/2019 16:02      IMPRESSION AND PLAN:   Right pulmonary embolism. The patient will be admitted to medical floor. Start full dose anticoagulation.  May change to p.o. anticoagulation upon discharge.  Hypokalemia.  Potassium supplement and follow-up potassium and magnesium level. Alcohol abuse.  CIWA protocol. Abnormal liver function test.  Possible due to alcohol abuse.  Follow-up LFT. Chronic disease.  Stable. Social worker  consult for discharge, since the patient is homeless. All the records are reviewed and case discussed with ED provider. Management plans discussed with the patient, family and they are in agreement.  CODE STATUS: Full code.  TOTAL TIME TAKING CARE OF THIS PATIENT: 53 minutes.    Demetrios Loll M.D on 07/28/2019 at 4:59 PM  Between 7am to 6pm - Pager - [phone]  After 6pm go to [email or web address] - Proofreader  [facility]  Office  [phone]  CC: Primary care physician; Patient, [name] Per   Note: This dictation was prepared with Dragon dictation along with smaller phrase technology. Any transcriptional errors that result from this process are unin

## 2019-07-28 NOTE — ED Provider Notes (Signed)
[facility] Emergency Department Provider Note   ____________________________________________   First MD Initiated Contact with Patient 07/28/19 1131     (approximate)  I have reviewed the triage vital signs and the nursing notes.   HISTORY  Chief Complaint Cough and Chest Pain    HPI George Gago. is a 63 y.o. male patient complains of cough and chest pain for 2 weeks.  Cough started first and then the chest pain afterwards.  Told triage that the chest pain is constant the center of his chest.  He said it radiates down his left arm.  Patient tells me he has no chest pain in his chest unless he swallows any pain from the bottom of his neck down into the sternum.  It is only when he swallows and he when he swallows anything liquids or solids.  It does not hurt when he coughs.  He does not have chest pain otherwise.  He is not short of breath.  He says the cough is productive of small amounts of clear phlegm.  He does not think he has been running a fever.  Patient has been homeless and living in his car.      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[facility])     Patient Active Problem List   Diagnosis Date Noted   Alcohol dependence with uncomplicated intoxication ([place])    Acute respiratory failure with hypoxia ([place]) 05/15/2019   Wernicke's encephalopathy 04/15/2019   GI bleed 03/31/2019   Alcohol withdrawal ([place]) 12/09/2017    Past Surgical History:  Procedure Laterality Date   ESOPHAGOGASTRODUODENOSCOPY N/A 05/18/2019   Procedure: ESOPHAGOGASTRODUODENOSCOPY (EGD);  Surgeon: Lin Landsman, MD;  Location: [facility] ENDOSCOPY;  Service:  Gastroenterology;  Laterality: N/A;   HERNIA REPAIR  1958   JOINT REPLACEMENT     REPLACEMENT TOTAL KNEE BILATERAL  2009   TONSILLECTOMY  2008   with neck dissection     Prior to Admission medications   Medication Sig Start Date End Date Taking? Authorizing Provider  chlordiazePOXIDE (LIBRIUM) 10 MG capsule Day 1-2: Take 1 tablet PO Q6H Day 3-4: Take 1 tablet PO Q8H Day 5-6: Take 1 tablet PO Q12H Day 7-8: Take 1 tablet Daily. 07/18/19   Harvest Dark, MD    Allergies Sulfa antibiotics  Family History  Problem Relation Age of Onset   Cancer Mother    Hypertension Father    Cancer Paternal Grandmother     Social History Social History   Tobacco Use   Smoking status: Never Smoker   Smokeless tobacco: Never Used  Substance Use Topics   Alcohol use: Yes    Alcohol/week: 5.0 standard drinks    Types: 5 Glasses of wine per week    Frequency: Never   Drug use: No    Review of Systems  Constitutional: No fever/chills Eyes: No visual changes. ENT: No sore throat. Cardiovascular: See HPI Respiratory: Denies shortness of breath. Gastrointestinal: No abdominal pain.  No nausea, no vomiting.  No diarrhea.  No constipation. Genitourinary: Negative for dysuria. Musculoskeletal: Negative for back pain. Skin: Negative for rash. Neurological: Negative for headaches,  focal weakness   ____________________________________________   PHYSICAL EXAM:  VITAL SIGNS: ED Triage Vitals  Enc Vitals Group     BP 07/28/19 1040 (!) 138/96     Pulse Rate 07/28/19 1040 (!) 114     Resp 07/28/19 1040 18     Temp 07/28/19 1040 98.8 F (37.1 C)     Temp Source 07/28/19 1040 Oral     SpO2 07/28/19 1040 92 %     Weight 07/28/19 1015 170 lb (77.1 kg)     Height 07/28/19 1015 5\' 8"  (1.727 m)     Head Circumference --      Peak Flow --      Pain Score 07/28/19 1015 6     Pain Loc --      Pain Edu? --      Excl. in [place]? --     Constitutional: Alert and oriented. Well  appearing and in no acute distress. Eyes: Conjunctivae are normal.  Head: Atraumatic. Nose: No congestion/rhinnorhea. Mouth/Throat: Mucous membranes are moist.  Oropharynx non-erythematous. Neck: No stridor.  Cardiovascular: Somewhat rapid rate, regular rhythm. Grossly normal heart sounds.  Good peripheral circulation. Respiratory: Normal respiratory effort.  No retractions. Lungs CTAB. Gastrointestinal: Soft and nontender. No distention. No abdominal bruits. No CVA tenderness. Musculoskeletal: No lower extremity tenderness nor edema.   Neurologic:  Normal speech and language. No gross focal neurologic deficits are appreciated.  Skin:  Skin is warm, dry and intact. No rash noted.   ____________________________________________   LABS (all labs ordered are listed, but only abnormal results are displayed)  Labs Reviewed  BASIC METABOLIC PANEL - Abnormal; Notable for the following components:      Result Value   Potassium 3.2 (*)    Glucose, Bld 140 (*)    BUN 6 (*)    Creatinine, Ser 0.57 (*)    Calcium 8.6 (*)    Anion gap 16 (*)    All other components within normal limits  CBC - Abnormal; Notable for the following components:   RBC 4.15 (*)    Hemoglobin 9.2 (*)    HCT 32.2 (*)    MCV 77.6 (*)    MCH 22.2 (*)    MCHC 28.6 (*)    RDW 24.5 (*)    All other components within normal limits  FIBRIN DERIVATIVES D-DIMER ([facility] ONLY) - Abnormal; Notable for the following components:   Fibrin derivatives D-dimer ([facility]) 1,400.27 (*)    All other components within normal limits  HEPATIC FUNCTION PANEL - Abnormal; Notable for the following components:   Albumin 3.2 (*)    AST 114 (*)    ALT 75 (*)    Alkaline Phosphatase 201 (*)    Bilirubin, Direct 0.3 (*)    All other components within normal limits  SARS CORONAVIRUS 2 (TAT 6-24 HRS)  BRAIN NATRIURETIC PEPTIDE  PROTIME-INR  APTT  TROPONIN I (HIGH SENSITIVITY)  TROPONIN I (HIGH SENSITIVITY)    ____________________________________________  EKG  EKG read interpreted by me shows sinus tachycardia rate of 116 normal axis no acute disease ____________________________________________  RADIOLOGY  ED MD interpretation:    Official radiology report(s): Dg Chest 2 View  Result Date: 07/28/2019 CLINICAL DATA:  Cough and chest pain for 2 weeks. EXAM: CHEST - 2 VIEW COMPARISON:  July 17, 2019 FINDINGS: The heart size and mediastinal contours are within normal limits. Both lungs are clear. The visualized skeletal structures are unremarkable. IMPRESSION: No active cardiopulmonary disease. Electronically Signed   By: Shanon Brow  Jimmye Norman III M.D   On: 07/28/2019 12:18   Ct Angio Chest Pe W And/or Wo Contrast  Result Date: 07/28/2019 CLINICAL DATA:  63 year old male with cough and chest pain for 2 weeks. EXAM: CT ANGIOGRAPHY CHEST WITH CONTRAST TECHNIQUE: Multidetector CT imaging of the chest was performed using the standard protocol during bolus administration of intravenous contrast. Multiplanar CT image reconstructions and MIPs were obtained to evaluate the vascular anatomy. CONTRAST:  6mL OMNIPAQUE IOHEXOL 350 MG/ML SOLN COMPARISON:  Chest radiographs earlier today. CT Chest, Abdomen, and Pelvis 05/15/2019 FINDINGS: Cardiovascular: Adequate contrast bolus timing in the pulmonary arterial tree. Intermittent respiratory motion, severe just below the hila and affecting much of the lower lobe pulmonary artery detail. However, there is a suspicious appearance of the right lower lobe branches on series 5, image 149, which continues into image 161. And also nonocclusive thrombus visible in the right upper lobe branch on image 105 which is relatively motion free. Calcified coronary artery atherosclerosis. Negative visible aorta aside from mild atherosclerosis. Stable cardiac size, no cardiomegaly. No pericardial effusion. Mediastinum/Nodes: Mediastinal lymph node size appears stable to slightly decreased  from August. Persistent circumferential thickening of the esophagus, without progression. Lungs/Pleura: Intermittent motion artifact. Stable lung volumes. No convincing abnormal pulmonary opacity today. And no pleural effusion is evident. Upper Abdomen: Severe hepatic steatosis. Thickened appearance of the gastroesophageal junction (see esophagus findings above the). Otherwise negative visible upper abdomen. Musculoskeletal: Chronic left rib fractures. Chronic right posterior 10th rib fracture. Thoracic vertebrae appear stable. Motion artifact at the sternum. No acute or suspicious osseous lesion identified. Review of the MIP images confirms the above findings. IMPRESSION: 1. Motion degraded study but positive for acute pulmonary emboli in the right lung. No associated pleural effusion or abnormal pulmonary opacity. 2. Persistent abnormal thickening of the esophagus, stable since August. This is nonspecific but esophagitis seems more likely than esophageal neoplasm now. 3. Stable to slightly decreased mediastinal lymph node size since August. 4. Calcified coronary artery atherosclerosis. 5. Severe hepatic steatosis. Electronically Signed   By: Genevie Ann M.D.   On: 07/28/2019 16:13   US Abdomen Limited  Result Date: 07/28/2019 CLINICAL DATA:  63 year old male with history of elevated liver function tests. EXAM: ULTRASOUND ABDOMEN LIMITED RIGHT UPPER QUADRANT COMPARISON:  No priors. FINDINGS: Gallbladder: No gallstones or wall thickening visualized. No sonographic Murphy sign noted by sonographer. Common bile duct: Diameter: 6 mm Liver: No focal lesion identified. Increased echogenicity throughout the hepatic parenchyma, indicative of hepatic steatosis. Portal vein is patent on color Doppler imaging with normal direction of blood flow towards the liver. Other: None. IMPRESSION: 1. No acute findings. Specifically, no cholelithiasis or evidence to suggest acute cholecystitis at this time. 2. Diffuse increased  echogenicity throughout the hepatic parenchyma, indicative of hepatic steatosis. Electronically Signed   By: Vinnie Langton M.D.   On: 07/28/2019 16:02    ____________________________________________   PROCEDURES  Procedure(s) performed (including Critical Care): Critical care time half an hour this includes talking to the patient and reviewing his films looking at his old records and talking to the hospitalist. Procedures   ____________________________________________   INITIAL IMPRESSION / [place] / ED COURSE  George Spry. was evaluated in Emergency Department on 07/28/2019 for the symptoms described in the history of present illness. He was evaluated in the context of the global COVID-19 pandemic, which necessitated consideration that the patient might be at risk for infection with the SARS-CoV-2 virus that causes COVID-19. Institutional protocols and algorithms that  pertain to the evaluation of patients at risk for COVID-19 are in a state of rapid change based on information released by regulatory bodies including the CDC and federal and state organizations. These policies and algorithms were followed during the patient's care in the ED.    Patient's CT comes back read as pulmonary emboli.  I reviewed the films and agree.  Also the CT is read as continued thickening of the esophagus most consistent with esophagitis.  I discussed this with the hospitalist will get him in the hospital.         ____________________________________________   FINAL CLINICAL IMPRESSION(S) / ED DIAGNOSES  Final diagnoses:  Pulmonary embolism without acute cor pulmonale, unspecified chronicity, unspecified pulmonary embolism type [facility])     ED Discharge Orders    None       Note:  This document was prepared using Dragon voice recognition software and may include unintentional dictation errors.    Nena Polio, MD 07/28/19 [phone]

## 2019-07-28 NOTE — ED Notes (Signed)
While this tech was getting EKG pt asked about staying for a few days. I let him know what we can do is offer some resources as to where he might be able to go, he then stated he was scared and wanted to die. Let nurse know then when walked back in the room pt stated he wanted detox.

## 2019-07-28 NOTE — ED Notes (Signed)
Dr. Chen at bedside.

## 2019-07-29 ENCOUNTER — Inpatient Hospital Stay: Payer: Self-pay

## 2019-07-29 DIAGNOSIS — R112 Nausea with vomiting, unspecified: Secondary | ICD-10-CM

## 2019-07-29 DIAGNOSIS — F10220 Alcohol dependence with intoxication, uncomplicated: Secondary | ICD-10-CM

## 2019-07-29 LAB — BASIC METABOLIC PANEL
Anion gap: 14 (ref 5–15)
BUN: 7 mg/dL — ABNORMAL LOW (ref 8–23)
CO2: 25 mmol/L (ref 22–32)
Calcium: 8.6 mg/dL — ABNORMAL LOW (ref 8.9–10.3)
Chloride: 103 mmol/L (ref 98–111)
Creatinine, Ser: 0.69 mg/dL (ref 0.61–1.24)
GFR calc Af Amer: >60 mL/min (ref >60)
GFR calc non Af Amer: >60 mL/min (ref >60)
Glucose, Bld: 107 mg/dL — ABNORMAL HIGH (ref 70–99)
Potassium: 2.8 mmol/L — ABNORMAL LOW (ref 3.5–5.1)
Sodium: 142 mmol/L (ref 135–145)

## 2019-07-29 LAB — HEPATITIS PANEL, ACUTE
HCV Ab: NONREACTIVE
Hep A IgM: NONREACTIVE
Hep B C IgM: NONREACTIVE
Hepatitis B Surface Ag: NONREACTIVE

## 2019-07-29 LAB — POTASSIUM: Potassium: 3.2 mmol/L — ABNORMAL LOW (ref 3.5–5.1)

## 2019-07-29 LAB — CBC
HCT: 27.6 % — ABNORMAL LOW (ref 39.0–52.0)
Hemoglobin: 7.9 g/dL — ABNORMAL LOW (ref 13.0–17.0)
MCH: 22.4 pg — ABNORMAL LOW (ref 26.0–34.0)
MCHC: 28.6 g/dL — ABNORMAL LOW (ref 30.0–36.0)
MCV: 78.4 fL — ABNORMAL LOW (ref 80.0–100.0)
Platelets: 245 10*3/uL (ref 150–400)
RBC: 3.52 MIL/uL — ABNORMAL LOW (ref 4.22–5.81)
RDW: 24.7 % — ABNORMAL HIGH (ref 11.5–15.5)
WBC: 8.6 10*3/uL (ref 4.0–10.5)
nRBC: 0 % (ref 0.0–0.2)

## 2019-07-29 LAB — RETICULOCYTES
Immature Retic Fract: 33.9 % — ABNORMAL HIGH (ref 2.3–15.9)
RBC.: 4.02 MIL/uL — ABNORMAL LOW (ref 4.22–5.81)
Retic Count, Absolute: 85.6 10*3/uL (ref 19.0–186.0)
Retic Ct Pct: 2.1 % (ref 0.4–3.1)

## 2019-07-29 LAB — IRON AND TIBC
Iron: 24 ug/dL — ABNORMAL LOW (ref 45–182)
Saturation Ratios: 11 % — ABNORMAL LOW (ref 17.9–39.5)
TIBC: 222 ug/dL — ABNORMAL LOW (ref 250–450)
UIBC: 198 ug/dL

## 2019-07-29 LAB — HEPATIC FUNCTION PANEL
ALT: 54 U/L — ABNORMAL HIGH (ref 0–44)
AST: 65 U/L — ABNORMAL HIGH (ref 15–41)
Albumin: 2.7 g/dL — ABNORMAL LOW (ref 3.5–5.0)
Alkaline Phosphatase: 181 U/L — ABNORMAL HIGH (ref 38–126)
Bilirubin, Direct: 0.5 mg/dL — ABNORMAL HIGH (ref 0.0–0.2)
Indirect Bilirubin: 0.9 mg/dL (ref 0.3–0.9)
Total Bilirubin: 1.4 mg/dL — ABNORMAL HIGH (ref 0.3–1.2)
Total Protein: 5.6 g/dL — ABNORMAL LOW (ref 6.5–8.1)

## 2019-07-29 LAB — VITAMIN B12: Vitamin B-12: 1010 pg/mL — ABNORMAL HIGH (ref 180–914)

## 2019-07-29 LAB — FOLATE: Folate: 9.5 ng/mL (ref >5.9)

## 2019-07-29 LAB — FERRITIN: Ferritin: 32 ng/mL (ref 24–336)

## 2019-07-29 MED ORDER — POTASSIUM CHLORIDE 10 MEQ/100ML IV SOLN
10.0000 meq | INTRAVENOUS | Status: AC
  Administered 2019-07-29 (×4): 10 meq via INTRAVENOUS
  Filled 2019-07-29 (×4): qty 100

## 2019-07-29 MED ORDER — NICOTINE 21 MG/24HR TD PT24
21.0000 mg | MEDICATED_PATCH | Freq: Every day | TRANSDERMAL | Status: DC
  Administered 2019-08-02 – 2019-08-08 (×7): 21 mg via TRANSDERMAL
  Filled 2019-07-29 (×12): qty 1

## 2019-07-29 MED ORDER — GUAIFENESIN-DM 100-10 MG/5ML PO SYRP: 5.0000 mL | ORAL_SOLUTION | ORAL | Status: DC | PRN

## 2019-07-29 MED ORDER — LORAZEPAM 1 MG PO TABS: 1.0000 mg | ORAL_TABLET | ORAL | Status: AC | PRN

## 2019-07-29 MED ORDER — APIXABAN 5 MG PO TABS
10.0000 mg | ORAL_TABLET | Freq: Two times a day (BID) | ORAL | Status: DC
  Administered 2019-07-29 – 2019-07-31 (×5): 10 mg via ORAL
  Filled 2019-07-29 (×6): qty 2

## 2019-07-29 MED ORDER — GABAPENTIN 400 MG PO CAPS: 400.0000 mg | ORAL_CAPSULE | Freq: Three times a day (TID) | ORAL | Status: DC

## 2019-07-29 MED ORDER — PROMETHAZINE HCL 25 MG/ML IJ SOLN
25.0000 mg | Freq: Four times a day (QID) | INTRAMUSCULAR | Status: DC | PRN
  Administered 2019-07-29 – 2019-08-08 (×6): 25 mg via INTRAVENOUS
  Filled 2019-07-29 (×6): qty 1

## 2019-07-29 MED ORDER — APIXABAN 5 MG PO TABS: 5.0000 mg | ORAL_TABLET | Freq: Two times a day (BID) | ORAL | Status: DC

## 2019-07-29 MED ORDER — GABAPENTIN 400 MG PO CAPS
400.0000 mg | ORAL_CAPSULE | Freq: Three times a day (TID) | ORAL | Status: DC
  Administered 2019-07-29 – 2019-08-08 (×10): 400 mg via ORAL
  Filled 2019-07-29 (×11): qty 1

## 2019-07-29 MED ORDER — HALOPERIDOL LACTATE 5 MG/ML IJ SOLN: 2.0000 mg | Freq: Four times a day (QID) | INTRAMUSCULAR | Status: DC

## 2019-07-29 MED ORDER — HALOPERIDOL LACTATE 5 MG/ML IJ SOLN: 2.0000 mg | Freq: Four times a day (QID) | INTRAMUSCULAR | Status: DC | PRN

## 2019-07-29 MED ORDER — LORAZEPAM 2 MG/ML IJ SOLN
1.0000 mg | INTRAMUSCULAR | Status: AC | PRN
  Administered 2019-07-30: 09:00:00 3 mg via INTRAVENOUS
  Administered 2019-07-30 – 2019-07-31 (×3): 2 mg via INTRAVENOUS
  Administered 2019-07-31 (×2): 4 mg via INTRAVENOUS
  Administered 2019-08-01 (×3): 2 mg via INTRAVENOUS
  Administered 2019-08-01: 4 mg via INTRAVENOUS
  Filled 2019-07-29: qty 1
  Filled 2019-07-29: qty 2
  Filled 2019-07-29 (×4): qty 1
  Filled 2019-07-29 (×3): qty 2
  Filled 2019-07-29: qty 1

## 2019-07-29 MED ORDER — HALOPERIDOL LACTATE 5 MG/ML IJ SOLN
2.0000 mg | Freq: Once | INTRAMUSCULAR | Status: AC
  Administered 2019-07-29: 2 mg via INTRAVENOUS
  Filled 2019-07-29: qty 1

## 2019-07-29 MED ORDER — LORAZEPAM 2 MG/ML IJ SOLN: 1.0000 mg | Freq: Four times a day (QID) | INTRAMUSCULAR | Status: DC

## 2019-07-29 MED ORDER — POTASSIUM CHLORIDE CRYS ER 20 MEQ PO TBCR
40.0000 meq | EXTENDED_RELEASE_TABLET | ORAL | Status: AC
  Administered 2019-07-29 (×2): 40 meq via ORAL
  Filled 2019-07-29 (×2): qty 2

## 2019-07-29 MED ORDER — SODIUM CHLORIDE 0.9 % IV SOLN
510.0000 mg | Freq: Once | INTRAVENOUS | Status: AC
  Administered 2019-07-29: 510 mg via INTRAVENOUS
  Filled 2019-07-29: qty 17

## 2019-07-29 MED ORDER — LORAZEPAM 2 MG/ML IJ SOLN
2.0000 mg | Freq: Four times a day (QID) | INTRAMUSCULAR | Status: DC
  Administered 2019-07-29 (×2): 2 mg via INTRAVENOUS
  Filled 2019-07-29 (×2): qty 1

## 2019-07-29 MED ORDER — PANTOPRAZOLE SODIUM 40 MG PO TBEC
40.0000 mg | DELAYED_RELEASE_TABLET | Freq: Every day | ORAL | Status: DC
  Administered 2019-07-29: 10:00:00 40 mg via ORAL
  Filled 2019-07-29: qty 1

## 2019-07-29 MED ORDER — LORAZEPAM 2 MG/ML IJ SOLN
0.0000 mg | Freq: Four times a day (QID) | INTRAMUSCULAR | Status: DC
  Administered 2019-07-29: 2 mg via INTRAVENOUS
  Filled 2019-07-29: qty 1

## 2019-07-29 MED ORDER — LORAZEPAM 2 MG/ML IJ SOLN: 0.0000 mg | Freq: Two times a day (BID) | INTRAMUSCULAR | Status: DC

## 2019-07-29 MED ORDER — POTASSIUM CHLORIDE 10 MEQ/100ML IV SOLN
10.0000 meq | INTRAVENOUS | Status: AC
  Administered 2019-07-29 (×2): 10 meq via INTRAVENOUS
  Filled 2019-07-29 (×2): qty 100

## 2019-07-29 MED ORDER — HALOPERIDOL LACTATE 5 MG/ML IJ SOLN: 1.0000 mg | Freq: Four times a day (QID) | INTRAMUSCULAR | Status: DC

## 2019-07-29 MED ORDER — PANTOPRAZOLE SODIUM 40 MG PO TBEC
40.0000 mg | DELAYED_RELEASE_TABLET | Freq: Two times a day (BID) | ORAL | Status: DC
  Administered 2019-07-29: 22:00:00 40 mg via ORAL
  Filled 2019-07-29 (×2): qty 1

## 2019-07-29 MED ORDER — ENSURE ENLIVE PO LIQD
237.0000 mL | Freq: Two times a day (BID) | ORAL | Status: DC
  Administered 2019-07-29: 237 mL via ORAL

## 2019-07-29 MED ORDER — FOLIC ACID 1 MG PO TABS
1.0000 mg | ORAL_TABLET | Freq: Every day | ORAL | Status: DC
  Administered 2019-07-31: 1 mg via ORAL
  Filled 2019-07-29 (×2): qty 1

## 2019-07-29 NOTE — Progress Notes (Addendum)
ANTICOAGULATION CONSULT NOTE - Initial Consult  Pharmacy Consult for Apixaban Indication: pulmonary embolus  Allergies  Allergen Reactions  . Sulfa Antibiotics Nausea Only    Patient Measurements: Height: 5\' 8"  (172.7 cm) Weight: 162 lb 12.8 oz (73.8 kg) IBW/kg (Calculated) : 68.4   Vital Signs: Temp: 98.1 F (36.7 C) (10/18 0436) Temp Source: Oral (10/18 0436) BP: 122/85 (10/18 0436) Pulse Rate: 85 (10/18 0436)  Labs: Recent Labs    07/28/19 1040 07/28/19 1319 07/28/19 1920 07/28/19 2320 07/29/19 0434  HGB 9.2*  --   --   --  7.9*  HCT 32.2*  --   --   --  27.6*  PLT 299  --   --   --  245  APTT  --   --  132*  --   --   LABPROT  --   --  16.1*  --   --   INR  --   --  1.3*  --   --   HEPARINUNFRC  --   --   --  0.30  --   CREATININE 0.57*  --   --   --   --   TROPONINIHS 5 7  --   --   --     Estimated Creatinine Clearance: 92.6 mL/min (A) (by C-G formula based on SCr of 0.57 mg/dL (L)).   Assessment: 63 yo male currently on heparin drip for PE transitioning to apixaban  Goal of Therapy:  Monitor platelets by anticoagulation protocol: Yes   Plan:  D/c heparin drip Start apixaban 10 mg BID x7 days then 5 mg BID  SCr and CBC at least every three days per policy Called RN and discussed plan  Pharmacy will continue to follow.   Rayna Sexton L 07/29/2019,7:46 AM

## 2019-07-29 NOTE — Progress Notes (Signed)
Pharmacy Electrolyte Monitoring Consult:  Pharmacy consulted to assist in monitoring and replacing electrolytes in this 63 y.o. male admitted on 07/28/2019 with Cough and Chest Pain   Labs:  Sodium (mmol/L)  Date Value  07/29/2019 142   Potassium (mmol/L)  Date Value  07/29/2019 3.2 (L)   Magnesium (mg/dL)  Date Value  07/28/2019 2.0   Phosphorus (mg/dL)  Date Value  04/28/2019 5.0 (H)   Calcium (mg/dL)  Date Value  07/29/2019 8.6 (L)   Albumin (g/dL)  Date Value  07/29/2019 2.7 (L)    Assessment/Plan: K 3.2; Mag 2.0 last night   Will order KCl 10 mEq IV x 4 runs (1 po 78meq dose given after level checked but per nursing pt is vomiting frequently)  CMP and mag level in AM  Pharmacy will continue to follow.  Lu Duffel, PharmD, BCPS Clinical Pharmacist 07/29/2019 7:26 PM

## 2019-07-29 NOTE — Progress Notes (Signed)
[facility] [place] NAME: George Wade    MR#:  [id number]  DATE OF BIRTH:  21-Jan-1956  SUBJECTIVE:  Patient is reporting that he is nauseous and unable to take in any p.o.'s or liquids.  Reports he had a EGD at [facility] about 2 weeks ago and at that time he reports his EGD was normal.  Over the past week his symptoms of not being able to eat have worsened.  He does drink chronic alcohol.  Last drink was 2 days ago.  He has gone through DTs in the past.  He has some shakes today and feels angry.  REVIEW OF SYSTEMS:    Review of Systems  Constitutional: Negative for fever, chills weight loss HENT: Negative for ear pain, nosebleeds, congestion, facial swelling, rhinorrhea, neck pain, neck stiffness and ear discharge.   Respiratory: Negative for cough, shortness of breath, wheezing  Cardiovascular: Negative for chest pain, palpitations and leg swelling.  Gastrointestinal: Negative for heartburn, abdominal pain, ++vomiting, no diarrhea or consitpation no melena Genitourinary: Negative for dysuria, urgency, frequency, hematuria Musculoskeletal: Negative for back pain or joint pain Neurological: Negative for dizziness, seizures, syncope, focal weakness,  numbness and headaches.  Hematological: Does not bruise/bleed easily.  Psychiatric/Behavioral: Negative for hallucinations, confusion, dysphoric mood    Tolerating Diet: no      DRUG ALLERGIES:   Allergies  Allergen Reactions  . Sulfa Antibiotics Nausea Only    VITALS:  Blood pressure 122/85, pulse 85, temperature 98.1 F (36.7 C), temperature source Oral, resp. rate 16, height 5\' 8"  (1.727 m), weight 73.8 kg, SpO2 93 %.  PHYSICAL EXAMINATION:  Constitutional: Appears well-developed and well-nourished. No distress. HENT: Normocephalic. Marland Kitchen Oropharynx is clear and moist.  Eyes: Conjunctivae and EOM are normal. PERRLA, no scleral icterus.  Neck: Normal ROM. Neck supple. No JVD. No tracheal  deviation. CVS: RRR, S1/S2 +, no murmurs, no gallops, no carotid bruit.  Pulmonary: Effort and breath sounds normal, no stridor, rhonchi, wheezes, rales.  Abdominal: Soft. BS +,  no distension, tenderness, rebound or guarding.  Musculoskeletal: Normal range of motion. No edema and no tenderness.  Neuro: Alert. CN 2-12 grossly intact. No focal deficits. Skin: Skin is warm and dry. No rash noted. Psychiatric: Normal mood and affect.      LABORATORY PANEL:   CBC Recent Labs  Lab 07/29/19 0434  WBC 8.6  HGB 7.9*  HCT 27.6*  PLT 245   ------------------------------------------------------------------------------------------------------------------  Chemistries  Recent Labs  Lab 07/28/19 1920 07/29/19 0434 07/29/19 0730  NA  --   --  142  K  --   --  2.8*  CL  --   --  103  CO2  --   --  25  GLUCOSE  --   --  107*  BUN  --   --  7*  CREATININE  --   --  0.69  CALCIUM  --   --  8.6*  MG 2.0  --   --   AST  --  65*  --   ALT  --  54*  --   ALKPHOS  --  181*  --   BILITOT  --  1.4*  --    ------------------------------------------------------------------------------------------------------------------  Cardiac Enzymes No results for input(s): TROPONINI in the last 168 hours. ------------------------------------------------------------------------------------------------------------------  RADIOLOGY:  Dg Chest 2 View  Result Date: 07/28/2019 CLINICAL DATA:  Cough and chest pain for 2 weeks. EXAM: CHEST - 2 VIEW COMPARISON:  July 17, 2019 FINDINGS: The heart size and mediastinal contours are within normal limits. Both lungs are clear. The visualized skeletal structures are unremarkable. IMPRESSION: No active cardiopulmonary disease. Electronically Signed   By: Dorise Bullion III M.D   On: 07/28/2019 12:18   Ct Angio Chest Pe W And/or Wo Contrast  Addendum Date: 07/28/2019   ADDENDUM REPORT: 07/28/2019 18:02 ADDENDUM: Study discussed by telephone with Dr. Archie Balboa in  the ED on 07/28/2019 at 1755 hours. Electronically Signed   By: Genevie Ann M.D.   On: 07/28/2019 18:02   Result Date: 07/28/2019 CLINICAL DATA:  63 year old male with cough and chest pain for 2 weeks. EXAM: CT ANGIOGRAPHY CHEST WITH CONTRAST TECHNIQUE: Multidetector CT imaging of the chest was performed using the standard protocol during bolus administration of intravenous contrast. Multiplanar CT image reconstructions and MIPs were obtained to evaluate the vascular anatomy. CONTRAST:  20mL OMNIPAQUE IOHEXOL 350 MG/ML SOLN COMPARISON:  Chest radiographs earlier today. CT Chest, Abdomen, and Pelvis 05/15/2019 FINDINGS: Cardiovascular: Adequate contrast bolus timing in the pulmonary arterial tree. Intermittent respiratory motion, severe just below the hila and affecting much of the lower lobe pulmonary artery detail. However, there is a suspicious appearance of the right lower lobe branches on series 5, image 149, which continues into image 161. And also nonocclusive thrombus visible in the right upper lobe branch on image 105 which is relatively motion free. Calcified coronary artery atherosclerosis. Negative visible aorta aside from mild atherosclerosis. Stable cardiac size, no cardiomegaly. No pericardial effusion. Mediastinum/Nodes: Mediastinal lymph node size appears stable to slightly decreased from August. Persistent circumferential thickening of the esophagus, without progression. Lungs/Pleura: Intermittent motion artifact. Stable lung volumes. No convincing abnormal pulmonary opacity today. And no pleural effusion is evident. Upper Abdomen: Severe hepatic steatosis. Thickened appearance of the gastroesophageal junction (see esophagus findings above the). Otherwise negative visible upper abdomen. Musculoskeletal: Chronic left rib fractures. Chronic right posterior 10th rib fracture. Thoracic vertebrae appear stable. Motion artifact at the sternum. No acute or suspicious osseous lesion identified. Review of the  MIP images confirms the above findings. IMPRESSION: 1. Motion degraded study but positive for acute pulmonary emboli in the right lung. No associated pleural effusion or abnormal pulmonary opacity. 2. Persistent abnormal thickening of the esophagus, stable since August. This is nonspecific but esophagitis seems more likely than esophageal neoplasm now. 3. Stable to slightly decreased mediastinal lymph node size since August. 4. Calcified coronary artery atherosclerosis. 5. Severe hepatic steatosis. Electronically Signed: By: Genevie Ann M.D. On: 07/28/2019 16:13   US Abdomen Limited  Result Date: 07/28/2019 CLINICAL DATA:  63 year old male with history of elevated liver function tests. EXAM: ULTRASOUND ABDOMEN LIMITED RIGHT UPPER QUADRANT COMPARISON:  No priors. FINDINGS: Gallbladder: No gallstones or wall thickening visualized. No sonographic Murphy sign noted by sonographer. Common bile duct: Diameter: 6 mm Liver: No focal lesion identified. Increased echogenicity throughout the hepatic parenchyma, indicative of hepatic steatosis. Portal vein is patent on color Doppler imaging with normal direction of blood flow towards the liver. Other: None. IMPRESSION: 1. No acute findings. Specifically, no cholelithiasis or evidence to suggest acute cholecystitis at this time. 2. Diffuse increased echogenicity throughout the hepatic parenchyma, indicative of hepatic steatosis. Electronically Signed   By: Vinnie Langton M.D.   On: 07/28/2019 16:02     ASSESSMENT AND PLAN:   63 year old male with EtOH dependence who presents to the emergency room due to chest pain and inability to swallow food.  1.  Small right lung pulmonary emboli seen on CT  scan: Transition to oral Eliquis.  2.  Chronic anemia: Baseline hemoglobin is 8.0. Order anemia panel Gl[place]in on Eliquis  3.  Inability to swallow: GI consultation placed via epic Continue PPI  4.  EtOH: Continue CIWA protocol  5.  Elevated LFTs: Right upper quad  ultrasound shows steatosis LFTs are improving This is alcohol induced  6.  Hypokalemia: Pharmacy consultation to replete 7. Tobacco dependence: Patient is encouraged to quit smoking and willing to attempt to quit was assessed. Patient highly motivated.Counseling was provided for 4 minutes. Nicotine patch   CM for d/c planning Ensure ordered as per patient's wishes.  Dietary consult also requested.   Management plans discussed with the patient and he is in agreement.  CODE STATUS: full  TOTAL TIME TAKING CARE OF THIS PATIENT: 30 minutes.     POSSIBLE D/C 1-2 days, DEPENDING ON CLINICAL CONDITION.   Bettey Costa M.D on 07/29/2019 at 10:01 AM  Between 7am to 6pm - Pager - [phone] After 6pm go to [email or web address] - password [email or web address] [facility]  Office  [phone]  CC: Primary care physician; Patient, [name] Per  Note: This dictation was prepared with Dragon dictation along with smaller phrase technology. Any transcriptional errors that result from this process are unintentional.

## 2019-07-29 NOTE — Consult Note (Addendum)
[place] [facility] Consult   Reason for Consult:  Alcohol abuse/dependence Referring Physician:  Dr Bridgett Larsson Patient Identification: George Wade. MRN:  [id number] Principal Diagnosis: Alcohol-induced mood disorder ([place]) Diagnosis:  Principal Problem:   Alcohol-induced mood disorder ([place]) Active Problems:   Alcohol dependence with uncomplicated intoxication ([place])   Pulmonary emboli ([place])  Total Time spent with patient: 1 hour  Subjective:   George Wade. is a 63 y.o. male patient admitted with PE.  "I'm not so good."  Patient seen and evaluated in person by this provider he complains of not being able to tolerate any solid foods or liquids.  GI consult placed and evaluated him.  Reported feeling like things were crawling on him and seeing things at times.  His Ativan was increased and started IV for better effects to prevent further DTs.  Continues to cough and report chest pain.  Reports low level of depression and anxiety related to his physical issues.  We will continue to follow as he has a history of DTs.  HPI per MD:  George Wade. is a 63 y.o. male patient complains of cough and chest pain for 2 weeks.  Cough started first and then the chest pain afterwards.  Told triage that the chest pain is constant the center of his chest.  He said it radiates down his left arm.  Patient tells me he has no chest pain in his chest unless he swallows any pain from the bottom of his neck down into the sternum.  It is only when he swallows and he when he swallows anything liquids or solids.  It does not hurt when he coughs.  He does not have chest pain otherwise.  He is not short of breath.  He says the cough is productive of small amounts of clear phlegm.  He does not think he has been running a fever.  Patient has been homeless and living in his car.  Past Psychiatric History: alcohol dependence, depression, anxiety  Risk to Self:  none Risk to Others:  none Prior Inpatient  Therapy:  multiple detoxs Prior Outpatient Therapy:  yes  Past Medical History:  Past Medical History:  Diagnosis Date  . 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 ED (erectile dysfunction)   . Fatigue   . History of pneumonia   . Hypercholesteremia   . Hypertension   . Lymphoma ([place]) 2008   non hodgkins lymphoma  . Rectal bleeding   . 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 ESOPHAGOGASTRODUODENOSCOPY N/A 05/18/2019   Procedure: ESOPHAGOGASTRODUODENOSCOPY (EGD);  Surgeon: Lin Landsman, MD;  Location: [facility] ENDOSCOPY;  Service: Gastroenterology;  Laterality: N/A;  . [place]  . JOINT REPLACEMENT    . REPLACEMENT TOTAL KNEE BILATERAL  2009  . TONSILLECTOMY  2008   with neck dissection    Family History:  Family History  Problem Relation Age of Onset  . Cancer Mother   . Hypertension Father   . Cancer Paternal Grandmother    Family Psychiatric  History: none Social History:  Social History   Substance and Sexual Activity  Alcohol Use Yes  . Alcohol/week: 5.0 standard drinks  . Types: 5 Glasses of wine per week  . Frequency: Never     Social History   Substance and Sexual Activity  Drug Use No  Social History   Socioeconomic History  . Marital status: Divorced    Spouse name: Not on file  . Number of children: Not on file  . Years of education: Not on file  . Highest education level: Not on file  Occupational History  . Not on file  Social Needs  . Financial resource strain: Not on file  . Food insecurity    Worry: Not on file    Inability: Not on file  . Transportation needs    Medical: Not on file    Non-medical: Not on file  Tobacco Use  . Smoking status: Never Smoker  . Smokeless tobacco: Never Used  Substance and Sexual Activity  . Alcohol use: Yes    Alcohol/week: 5.0 standard drinks    Types: 5 Glasses of wine per  week    Frequency: Never  . Drug use: No  . Sexual activity: Not on file  Lifestyle  . Physical activity    Days per week: Not on file    Minutes per session: Not on file  . Stress: Not on file  Relationships  . Social Herbalist on phone: Not on file    Gets together: Not on file    Attends religious service: Not on file    Active member of club or organization: Not on file    Attends meetings of clubs or organizations: Not on file    Relationship status: Not on file  Other Topics Concern  . Not on file  Social History Narrative  . Not on file   Additional Social History:    Allergies:   Allergies  Allergen Reactions  . Sulfa Antibiotics Nausea Only    Labs:  Results for orders placed or performed during the hospital encounter of 07/28/19 (from the past 48 hour(s))  Basic metabolic panel     Status: Abnormal   Collection Time: 07/28/19 10:40 AM  Result Value Ref Range   Sodium 141 135 - 145 mmol/L   Potassium 3.2 (L) 3.5 - 5.1 mmol/L   Chloride 103 98 - 111 mmol/L   CO2 22 22 - 32 mmol/L   Glucose, Bld 140 (H) 70 - 99 mg/dL   BUN 6 (L) 8 - 23 mg/dL   Creatinine, Ser 0.57 (L) 0.61 - 1.24 mg/dL   Calcium 8.6 (L) 8.9 - 10.3 mg/dL   GFR calc non Af Amer >60 >60 mL/min   GFR calc Af Amer >60 >60 mL/min   Anion gap 16 (H) 5 - 15    Comment: Performed at [facility], [address] [place], [place] [address]  CBC     Status: Abnormal   Collection Time: 07/28/19 10:40 AM  Result Value Ref Range   WBC 8.6 4.0 - 10.5 K/uL   RBC 4.15 (L) 4.22 - 5.81 MIL/uL   Hemoglobin 9.2 (L) 13.0 - 17.0 g/dL   HCT 32.2 (L) 39.0 - 52.0 %   MCV 77.6 (L) 80.0 - 100.0 fL   MCH 22.2 (L) 26.0 - 34.0 pg   MCHC 28.6 (L) 30.0 - 36.0 g/dL   RDW 24.5 (H) 11.5 - 15.5 %   Platelets 299 150 - 400 K/uL   nRBC 0.0 0.0 - 0.2 %    Comment: Performed at [facility], [place], [place] [address]  Troponin I (High Sensitivity)     Status: None    Collection Time: 07/28/19 10:40 AM  Result Value Ref Range   Troponin  I (High Sensitivity) 5 <18 ng/L    Comment: (NOTE) Elevated high sensitivity troponin I (hsTnI) values and significant  changes across serial measurements may suggest ACS but many other  chronic and acute conditions are known to elevate hsTnI results.  Refer to the "Links" section for chest pain algorithms and additional  guidance. Performed at [facility], [address] [place], [place] [address]   Brain natriuretic peptide     Status: None   Collection Time: 07/28/19 10:40 AM  Result Value Ref Range   B Natriuretic Peptide 79.0 0.0 - 100.0 pg/mL    Comment: Performed at [facility], [address] [place], [place] [address]  Hepatic function panel     Status: Abnormal   Collection Time: 07/28/19 10:40 AM  Result Value Ref Range   Total Protein 6.7 6.5 - 8.1 g/dL   Albumin 3.2 (L) 3.5 - 5.0 g/dL   AST 114 (H) 15 - 41 U/L   ALT 75 (H) 0 - 44 U/L   Alkaline Phosphatase 201 (H) 38 - 126 U/L   Total Bilirubin 1.1 0.3 - 1.2 mg/dL   Bilirubin, Direct 0.3 (H) 0.0 - 0.2 mg/dL   Indirect Bilirubin 0.8 0.3 - 0.9 mg/dL    Comment: Performed at [facility], [address] [place],  [address]  Fibrin derivatives D-Dimer     Status: Abnormal   Collection Time: 07/28/19  1:19 PM  Result Value Ref Range   Fibrin derivatives D-dimer ([facility]) 1,400.27 (H) 0.00 - 499.00 ng/mL (FEU)    Comment: (NOTE) <> Exclusion of Venous Thromboembolism (VTE) - OUTPATIENT ONLY   (Emergency Department or [place])   0-499 ng/ml (FEU): With a low to intermediate pretest probability                      for VTE this test result excludes the diagnosis                      of VTE.   >499 ng/ml (FEU) : VTE not excluded; additional work up for VTE is                      required. <> Testing on Inpatients and Evaluation of Disseminated Intravascular   Coagulation (DIC) Reference Range:   0-499 ng/ml  (FEU) Performed at [facility] [address] [place], [place] [address]   SARS CORONAVIRUS 2 (TAT 6-24 HRS) Nasopharyngeal Nasopharyngeal Swab     Status: None   Collection Time: 07/28/19  1:19 PM   Specimen: Nasopharyngeal Swab  Result Value Ref Range   SARS Coronavirus 2 NEGATIVE NEGATIVE    Comment: (NOTE) SARS-CoV-2 target nucleic acids are NOT DETECTED. The SARS-CoV-2 RNA is generally detectable in upper and lower respiratory specimens during the acute phase of infection. Negative results do not preclude SARS-CoV-2 infection, do not rule out co-infections with other pathogens, and should not be used as the sole basis for treatment or other patient management decisions. Negative results must be combined with clinical observations, patient history, and epidemiological information. The expected result is Negative. Fact Sheet for Patients: SugarRoll.be Fact Sheet for Healthcare Providers: https://www.woods-mathews.com/ This test is not yet approved or cleared by the [place] FDA and  has been authorized for detection and/or diagnosis of SARS-CoV-2 by FDA under an Emergency Use Authorization (EUA). This EUA will remain  in effect (meaning this test can be used) for the duration of  the COVID-19 declaration under Section 56 4(b)(1) of the Act, 21 U.S.C. section 360bbb-3(b)(1), unless the authorization is terminated or revoked sooner. Performed at [facility], [place] [address] [place], [place] [address]   Troponin I (High Sensitivity)     Status: None   Collection Time: 07/28/19  1:19 PM  Result Value Ref Range   Troponin I (High Sensitivity) 7 <18 ng/L    Comment: (NOTE) Elevated high sensitivity troponin I (hsTnI) values and significant  changes across serial measurements may suggest ACS but many other  chronic and acute conditions are known to elevate hsTnI results.  Refer to the "Links" section for chest pain  algorithms and additional  guidance. Performed at [facility], [address] [place], [place] [address]   Protime-INR     Status: Abnormal   Collection Time: 07/28/19  7:20 PM  Result Value Ref Range   Prothrombin Time 16.1 (H) 11.4 - 15.2 seconds   INR 1.3 (H) 0.8 - 1.2    Comment: (NOTE) INR goal varies based on device and disease states. Performed at [facility], [place], [place] [address]   APTT     Status: Abnormal   Collection Time: 07/28/19  7:20 PM  Result Value Ref Range   aPTT 132 (H) 24 - 36 seconds    Comment:        IF BASELINE aPTT IS ELEVATED, SUGGEST PATIENT RISK ASSESSMENT BE USED TO DETERMINE APPROPRIATE ANTICOAGULANT THERAPY. Performed at [facility], [address] [place], [place] [address]   Magnesium     Status: None   Collection Time: 07/28/19  7:20 PM  Result Value Ref Range   Magnesium 2.0 1.7 - 2.4 mg/dL    Comment: Performed at [facility], [place], [place] [address]  Heparin level (unfractionated)     Status: None   Collection Time: 07/28/19 11:20 PM  Result Value Ref Range   Heparin Unfractionated 0.30 0.30 - 0.70 IU/mL    Comment: (NOTE) If heparin results are below expected values, and patient dosage has  been confirmed, suggest follow up testing of antithrombin III levels. Performed at [facility], [address] [place], [place] [address]   CBC     Status: Abnormal   Collection Time: 07/29/19  4:34 AM  Result Value Ref Range   WBC 8.6 4.0 - 10.5 K/uL   RBC 3.52 (L) 4.22 - 5.81 MIL/uL   Hemoglobin 7.9 (L) 13.0 - 17.0 g/dL   HCT 27.6 (L) 39.0 - 52.0 %   MCV 78.4 (L) 80.0 - 100.0 fL   MCH 22.4 (L) 26.0 - 34.0 pg   MCHC 28.6 (L) 30.0 - 36.0 g/dL   RDW 24.7 (H) 11.5 - 15.5 %   Platelets 245 150 - 400 K/uL   nRBC 0.0 0.0 - 0.2 %    Comment: Performed at [facility], [address] [place], [place] [address]  Hepatic function panel      Status: Abnormal   Collection Time: 07/29/19  4:34 AM  Result Value Ref Range   Total Protein 5.6 (L) 6.5 - 8.1 g/dL   Albumin 2.7 (L) 3.5 - 5.0 g/dL   AST 65 (H) 15 - 41 U/L   ALT 54 (H) 0 - 44 U/L   Alkaline Phosphatase 181 (H) 38 - 126 U/L   Total Bilirubin 1.4 (H) 0.3 - 1.2 mg/dL   Bilirubin, Direct 0.5 (H) 0.0 - 0.2 mg/dL  Indirect Bilirubin 0.9 0.3 - 0.9 mg/dL    Comment: Performed at [facility], [address] [place], [place] [address]  Vitamin B12     Status: Abnormal   Collection Time: 07/29/19  7:30 AM  Result Value Ref Range   Vitamin B-12 1,010 (H) 180 - 914 pg/mL    Comment: (NOTE) This assay is not validated for testing neonatal or myeloproliferative syndrome specimens for Vitamin B12 levels. Performed at [facility], [place] [address] [place], [place] [address]   Folate     Status: None   Collection Time: 07/29/19  7:30 AM  Result Value Ref Range   Folate 9.5 >5.9 ng/mL    Comment: Performed at [facility], [address] [place], [place] [address]  Iron and TIBC     Status: Abnormal   Collection Time: 07/29/19  7:30 AM  Result Value Ref Range   Iron 24 (L) 45 - 182 ug/dL   TIBC 222 (L) 250 - 450 ug/dL   Saturation Ratios 11 (L) 17.9 - 39.5 %   UIBC 198 ug/dL    Comment: Performed at [facility], [address] [place], [place] [address]  Ferritin     Status: None   Collection Time: 07/29/19  7:30 AM  Result Value Ref Range   Ferritin 32 24 - 336 ng/mL    Comment: Performed at [facility], [address] [place], [place] [address]  Reticulocytes     Status: Abnormal   Collection Time: 07/29/19  7:30 AM  Result Value Ref Range   Retic Ct Pct 2.1 0.4 - 3.1 %   RBC. 4.02 (L) 4.22 - 5.81 MIL/uL   Retic Count, Absolute 85.6 19.0 - 186.0 K/uL   Immature Retic Fract 33.9 (H) 2.3 - 15.9 %    Comment: Performed at [facility], [address] [place], [place] [address]  Basic metabolic panel      Status: Abnormal   Collection Time: 07/29/19  7:30 AM  Result Value Ref Range   Sodium 142 135 - 145 mmol/L   Potassium 2.8 (L) 3.5 - 5.1 mmol/L   Chloride 103 98 - 111 mmol/L   CO2 25 22 - 32 mmol/L   Glucose, Bld 107 (H) 70 - 99 mg/dL   BUN 7 (L) 8 - 23 mg/dL   Creatinine, Ser 0.69 0.61 - 1.24 mg/dL   Calcium 8.6 (L) 8.9 - 10.3 mg/dL   GFR calc non Af Amer >60 >60 mL/min   GFR calc Af Amer >60 >60 mL/min   Anion gap 14 5 - 15    Comment: Performed at [facility], [address] [place], [place] [address]    Current Facility-Administered Medications  Medication Dose Route Frequency Provider Last Rate Last Dose  . 0.9 %  sodium chloride infusion  250 mL Intravenous PRN Demetrios Loll, MD 10 mL/hr at 07/29/19 1141 250 mL at 07/29/19 1141  . acetaminophen (TYLENOL) tablet 650 mg  650 mg Oral Q6H PRN Demetrios Loll, MD       Or  . acetaminophen (TYLENOL) suppository 650 mg  650 mg Rectal Q6H PRN Demetrios Loll, MD      . albuterol (PROVENTIL) (2.5 MG/3ML) 0.083% nebulizer solution 2.5 mg  2.5 mg Nebulization Q2H PRN Demetrios Loll, MD      . apixaban Arne [place]) tablet 10 mg  10 mg Oral BID Rocky Morel, RPH   10 mg at 07/29/19 4010   Followed by  . [  START ON 08/05/2019] apixaban (ELIQUIS) tablet 5 mg  5 mg Oral BID Rocky Morel, RPH      . bisacodyl (DULCOLAX) EC tablet 5 mg  5 mg Oral Daily PRN Demetrios Loll, MD      . feeding supplement (ENSURE ENLIVE) (ENSURE ENLIVE) liquid 237 mL  237 mL Oral BID BM Mody, Sital, MD   237 mL at 07/29/19 1134  . gabapentin (NEURONTIN) capsule 400 mg  400 mg Oral TID Patrecia Pour, NP   400 mg at 07/29/19 1316  . HYDROcodone-acetaminophen (NORCO/VICODIN) 5-325 MG per tablet 1-2 tablet  1-2 tablet Oral Q4H PRN Demetrios Loll, MD      . hydrOXYzine (ATARAX/VISTARIL) tablet 25 mg  25 mg Oral Q6H PRN Patrecia Pour, NP      . loperamide (IMODIUM) capsule 2-4 mg  2-4 mg Oral PRN Patrecia Pour, NP      . Derrill Memo ON 07/30/2019] LORazepam (ATIVAN) injection 1 mg   1 mg Intravenous Q6H Gerald Dexter, RPH      . LORazepam (ATIVAN) injection 2 mg  2 mg Intravenous Q6H Patrecia Pour, NP   2 mg at 07/29/19 1315  . LORazepam (ATIVAN) tablet 1 mg  1 mg Oral Q6H PRN Patrecia Pour, NP      . multivitamin with minerals tablet 1 tablet  1 tablet Oral Daily Patrecia Pour, NP   1 tablet at 07/29/19 0951  . nicotine (NICODERM CQ - dosed in mg/24 hours) patch 21 mg  21 mg Transdermal Daily Mody, Sital, MD      . ondansetron (ZOFRAN) tablet 4 mg  4 mg Oral Q6H PRN Demetrios Loll, MD       Or  . ondansetron [facility]) injection 4 mg  4 mg Intravenous Q6H PRN Demetrios Loll, MD      . ondansetron (ZOFRAN-ODT) disintegrating tablet 4 mg  4 mg Oral Q6H PRN Patrecia Pour, NP      . pantoprazole (PROTONIX) EC tablet 40 mg  40 mg Oral BID Vonda Antigua B, MD      . potassium chloride SA (KLOR-CON) CR tablet 40 mEq  40 mEq Oral Q4H Rocky Morel, RPH   40 mEq at 07/29/19 1135  . promethazine (PHENERGAN) injection 25 mg  25 mg Intravenous Q6H PRN Bettey Costa, MD   25 mg at 07/29/19 1134  . senna-docusate (Senokot-S) tablet 1 tablet  1 tablet Oral QHS PRN Demetrios Loll, MD      . sodium chloride flush (NS) 0.9 % injection 3 mL  3 mL Intravenous Once Nena Polio, MD      . sodium chloride flush (NS) 0.9 % injection 3 mL  3 mL Intravenous Q12H Demetrios Loll, MD   3 mL at 07/29/19 [phone]  . sodium chloride flush (NS) 0.9 % injection 3 mL  3 mL Intravenous PRN Demetrios Loll, MD      . thiamine (B-1) injection 100 mg  100 mg Intramuscular Once Patrecia Pour, NP      . thiamine (VITAMIN B-1) tablet 100 mg  100 mg Oral Daily Patrecia Pour, NP   100 mg at 07/29/19 8921    Musculoskeletal: Strength & Muscle Tone: decreased Gait & Station: did not witness Patient leans: N/A  Psychiatric Specialty Exam: Physical Exam  Nursing note and vitals reviewed. Constitutional: He is oriented to person, place, and time. He appears well-developed and well-nourished.  HENT:  Head:  Normocephalic.  Neck: Normal range of  motion.  Respiratory: Effort normal.  Musculoskeletal: Normal range of motion.  Neurological: He is alert and oriented to person, place, and time.  Psychiatric: His speech is normal and behavior is normal. Judgment and thought content normal. His mood appears anxious. His affect is blunt. Cognition and memory are normal. He exhibits a depressed mood.    Review of Systems  Respiratory: Positive for cough.   Gastrointestinal: Positive for nausea and vomiting.  Psychiatric/Behavioral: Positive for depression and substance abuse. The patient is nervous/anxious.   All other systems reviewed and are negative.   Blood pressure 104/83, pulse 90, temperature (!) 97.5 F (36.4 C), temperature source Oral, resp. rate 18, height 5\' 8"  (1.727 m), weight 73.8 kg, SpO2 92 %.Body mass index is 24.75 kg/m.  General Appearance: Casual  Eye Contact:  Fair  Speech:  Normal Rate  Volume:  Decreased  Mood:  Anxious and Depressed  Affect:  Congruent  Thought Process:  Coherent and Descriptions of Associations: Intact  Orientation:  Full (Time, Place, and Person)  Thought Content:  WDL and Logical  Suicidal Thoughts:  No  Homicidal Thoughts:  No  Memory:  Immediate;   Fair Recent;   Fair Remote;   Fair  Judgement:  Fair  Insight:  Fair  Psychomotor Activity:  Decreased  Concentration:  Concentration: Fair and Attention Span: Fair  Recall:  [employer] of Knowledge:  Fair  Language:  Good  Akathisia:  No  Handed:  Right  AIMS (if indicated):     Assets:  Leisure Time Resilience  ADL's:  Intact  Cognition:  WNL  Sleep:        Treatment Plan Summary: Daily contact with patient to assess and evaluate symptoms and progress in treatment, Medication management and Plan alcohol dependence with complicated withdrawal:  -Stopped standard Ativan withdrawal taper -Started Ativan 2 mg every 6 hours, decrease to 1 mg tomorrow -Psychiatry will about evaluate his  symptoms tomorrow to see if dose is adequate at that time -Started gabapentin 400 mg 3 times daily to assist with alcohol detox  Disposition: Supportive therapy provided about ongoing stressors.  Waylan Boga, NP 07/29/2019 4:41 PM   Case discussed and plan agreed upon as outlined above.

## 2019-07-29 NOTE — Consult Note (Addendum)
Vonda Antigua, MD [address] [place], [place], [place], [address] [address], [place], [place], [place], [address] Phone: [phone]  Fax: [phone]  Consultation  Referring Provider:     Dr. Benjie Karvonen Primary Care Physician:  Patient, [name] Per Reason for Consultation:    Nausea  Date of Admission:  07/28/2019 Date of Consultation:  07/29/2019         HPI:   Tamaj Jurgens. is a 63 y.o. male admitted with chest pain found to have acute right-sided PE, is currently on Eliquis, with history of daily alcohol use, last drink being 2 days prior to presentation, with GI being consulted for nausea and vomiting.  Had an episode of emesis after breakfast today.  No dysphagia.  Also reports starting to feel withdrawal symptoms.  No hematemesis.  No melena.  Patient reports history of an upper endoscopy 2 weeks ago at [facility] that was normal.  States this was done due to nausea and vomiting as well.  Do not have these records.  Patient did have an upper endoscopy with Dr Marius Ditch in August 2020 for dysphagia.  This reported mucosal sloughing in the esophagus, and was otherwise normal.  Brushings were negative for fungus.  Patient was supposed to follow-up in clinic but did not.  He is also noted to have elevated liver enzymes, attributed to ongoing alcohol use.  Transaminases have improved.  Patient has elevation in alk phos, in my evaluation bilirubin.  Ultrasound showed increased echogenicity in the hepatic parenchyma, static steatosis.  Patent portal vein.  Past Medical History:  Diagnosis Date  . Alcoholism with psychosis with complication, with hallucinations ([place])   . ED (erectile dysfunction)   . Fatigue   . History of pneumonia   . Hypercholesteremia   . Hypertension   . Lymphoma ([place]) 2008   non hodgkins lymphoma  . Rectal bleeding   . 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  ESOPHAGOGASTRODUODENOSCOPY N/A 05/18/2019   Procedure: ESOPHAGOGASTRODUODENOSCOPY (EGD);  Surgeon: Lin Landsman, MD;  Location: [facility] ENDOSCOPY;  Service: Gastroenterology;  Laterality: N/A;  . [place]  . JOINT REPLACEMENT    . REPLACEMENT TOTAL KNEE BILATERAL  2009  . TONSILLECTOMY  2008   with neck dissection     Prior to Admission medications   Medication Sig Start Date End Date Taking? Authorizing Provider  Rivaroxaban (XARELTO) 15 MG TABS tablet Take 15 mg by mouth 2 (two) times daily with a meal.   Yes [provider]  rivaroxaban (XARELTO) 20 MG TABS tablet Take 20 mg by mouth daily. 07/23/19 07/22/20 Yes [provider]    Family History  Problem Relation Age of Onset  . Cancer Mother   . Hypertension Father   . Cancer Paternal Grandmother      Social History   Tobacco Use  . Smoking status: Never Smoker  . Smokeless tobacco: Never Used  Substance Use Topics  . Alcohol use: Yes    Alcohol/week: 5.0 standard drinks    Types: 5 Glasses of wine per week    Frequency: Never  . Drug use: No    Allergies as of 07/28/2019 - Review Complete 07/28/2019  Allergen Reaction Noted  . Sulfa antibiotics Nausea Only 08/30/2018    Review of Systems:    All systems reviewed and negative except where noted in HPI.   Physical  Exam:  Vital signs in last 24 hours: Vitals:   07/28/19 1800 07/28/19 1847 07/28/19 1856 07/29/19 0436  BP: 125/86  114/75 122/85  Pulse: (!) 134  (!) 116 85  Resp: 20  20 16  Temp:   99.5 F (37.5 C) 98.1 F (36.7 C)  TempSrc:   Oral Oral  SpO2: 96%  96% 93%  Weight:  73.8 kg    Height:  5' 8" (1.727 m)     Last BM Date: 07/28/19 General:   Pleasant, cooperative in NAD Head:  Normocephalic and atraumatic. Eyes:   No icterus.   Conjunctiva pink. PERRLA. Ears:  Normal auditory acuity. Neck:  Supple; no masses or thyroidomegaly Lungs: Respirations even and unlabored. Lungs clear to auscultation bilaterally.   No  wheezes, crackles, or rhonchi.  Abdomen:  Soft, nondistended, nontender. Normal bowel sounds. No appreciable masses or hepatomegaly.  No rebound or guarding.  Neurologic:  Alert and oriented x3;  grossly normal neurologically. Skin:  Intact without significant lesions or rashes. Cervical Nodes:  No significant cervical adenopathy. Psych:  Alert and cooperative. Normal affect.  LAB RESULTS: Recent Labs    07/28/19 1040 07/29/19 0434  WBC 8.6 8.6  HGB 9.2* 7.9*  HCT 32.2* 27.6*  PLT 299 245   BMET Recent Labs    07/28/19 1040 07/29/19 0730  NA 141 142  K 3.2* 2.8*  CL 103 103  CO2 22 25  GLUCOSE 140* 107*  BUN 6* 7*  CREATININE 0.57* 0.69  CALCIUM 8.6* 8.6*   LFT Recent Labs    07/29/19 0434  PROT 5.6*  ALBUMIN 2.7*  AST 65*  ALT 54*  ALKPHOS 181*  BILITOT 1.4*  BILIDIR 0.5*  IBILI 0.9   PT/INR Recent Labs    07/28/19 1920  LABPROT 16.1*  INR 1.3*    STUDIES: Dg Chest 2 View  Result Date: 07/28/2019 CLINICAL DATA:  Cough and chest pain for 2 weeks. EXAM: CHEST - 2 VIEW COMPARISON:  July 17, 2019 FINDINGS: The heart size and mediastinal contours are within normal limits. Both lungs are clear. The visualized skeletal structures are unremarkable. IMPRESSION: No active cardiopulmonary disease. Electronically Signed   By: David  Williams III M.D   On: 07/28/2019 12:18   Ct Angio Chest Pe W And/or Wo Contrast  Addendum Date: 07/28/2019   ADDENDUM REPORT: 07/28/2019 18:02 ADDENDUM: Study discussed by telephone with Dr. Goodman in the ED on 07/28/2019 at 1755 hours. Electronically Signed   By: H  Hall M.D.   On: 07/28/2019 18:02   Result Date: 07/28/2019 CLINICAL DATA:  62-year-old male with cough and chest pain for 2 weeks. EXAM: CT ANGIOGRAPHY CHEST WITH CONTRAST TECHNIQUE: Multidetector CT imaging of the chest was performed using the standard protocol during bolus administration of intravenous contrast. Multiplanar CT image reconstructions and MIPs were  obtained to evaluate the vascular anatomy. CONTRAST:  75mL OMNIPAQUE IOHEXOL 350 MG/ML SOLN COMPARISON:  Chest radiographs earlier today. CT Chest, Abdomen, and Pelvis 05/15/2019 FINDINGS: Cardiovascular: Adequate contrast bolus timing in the pulmonary arterial tree. Intermittent respiratory motion, severe just below the hila and affecting much of the lower lobe pulmonary artery detail. However, there is a suspicious appearance of the right lower lobe branches on series 5, image 149, which continues into image 161. And also nonocclusive thrombus visible in the right upper lobe branch on image 105 which is relatively motion free. Calcified coronary artery atherosclerosis. Negative visible aorta aside from mild atherosclerosis. Stable cardiac size, no cardiomegaly. No pericardial effusion.   Mediastinum/Nodes: Mediastinal lymph node size appears stable to slightly decreased from August. Persistent circumferential thickening of the esophagus, without progression. Lungs/Pleura: Intermittent motion artifact. Stable lung volumes. No convincing abnormal pulmonary opacity today. And no pleural effusion is evident. Upper Abdomen: Severe hepatic steatosis. Thickened appearance of the gastroesophageal junction (see esophagus findings above the). Otherwise negative visible upper abdomen. Musculoskeletal: Chronic left rib fractures. Chronic right posterior 10th rib fracture. Thoracic vertebrae appear stable. Motion artifact at the sternum. No acute or suspicious osseous lesion identified. Review of the MIP images confirms the above findings. IMPRESSION: 1. Motion degraded study but positive for acute pulmonary emboli in the right lung. No associated pleural effusion or abnormal pulmonary opacity. 2. Persistent abnormal thickening of the esophagus, stable since August. This is nonspecific but esophagitis seems more likely than esophageal neoplasm now. 3. Stable to slightly decreased mediastinal lymph node size since August. 4.  Calcified coronary artery atherosclerosis. 5. Severe hepatic steatosis. Electronically Signed: By: Genevie Ann M.D. On: 07/28/2019 16:13   US Abdomen Limited  Result Date: 07/28/2019 CLINICAL DATA:  63 year old male with history of elevated liver function tests. EXAM: ULTRASOUND ABDOMEN LIMITED RIGHT UPPER QUADRANT COMPARISON:  No priors. FINDINGS: Gallbladder: No gallstones or wall thickening visualized. No sonographic Murphy sign noted by sonographer. Common bile duct: Diameter: 6 mm Liver: No focal lesion identified. Increased echogenicity throughout the hepatic parenchyma, indicative of hepatic steatosis. Portal vein is patent on color Doppler imaging with normal direction of blood flow towards the liver. Other: None. IMPRESSION: 1. No acute findings. Specifically, no cholelithiasis or evidence to suggest acute cholecystitis at this time. 2. Diffuse increased echogenicity throughout the hepatic parenchyma, indicative of hepatic steatosis. Electronically Signed   By: Vinnie Langton M.D.   On: 07/28/2019 16:02      Impression / Plan:   Parris Cudworth. is a 63 y.o. y/o male with admission for chest pain and acute right-sided PE, currently on Eliquis, with GI being consulted for nausea and vomiting with ongoing history of daily alcohol use, now with withdrawal symptoms  Patient has had 2 upper endoscopies within this year.  The last 1 being 2 weeks ago that I do not have records of but patient states it was normal and was done for nausea and vomiting as well  He does not have any evidence of active GI bleeding  Endoscopic procedures at this time in the setting of acute PE, requiring Eliquis would have higher risks than benefits, and would not add any diagnostic benefit given that he had a procedure just 2 weeks ago.  Would recommend management with PPI.  Increase PPI to twice daily instead of once daily.  If symptoms do not resolve, diagnostic EGD can be considered at a later time after  obtaining and reviewing EGD records from [facility], if conservative management fails  Patient is also starting to have symptoms of alcohol withdrawal and some of his nausea is likely attributed to that  Continue antiemetics as needed  Continue to avoid hepatotoxic drugs Liver enzyme elevation is likely due to ongoing alcohol use. Encourage abstinence Continue folate, thiamine and CIWA protocol  Liver enzymes are improving.  Acute hepatitis panel ordered Can order further work-up for elevated liver enzymes if they worsen  Thank you for involving me in the care of this patient.      LOS: 1 day   Virgel Manifold, MD  07/29/2019, 10:27 AM

## 2019-07-29 NOTE — Consult Note (Signed)
[facility] for Heparin Drip Indication: pulmonary embolus  Allergies  Allergen Reactions  . Sulfa Antibiotics Nausea Only    Patient Measurements: Height: 5\' 8"  (172.7 cm) Weight: 162 lb 12.8 oz (73.8 kg) IBW/kg (Calculated) : 68.4 Heparin Dosing Weight: 77.1kg  Vital Signs: Temp: 99.5 F (37.5 C) (10/17 1856) Temp Source: Oral (10/17 1856) BP: 114/75 (10/17 1856) Pulse Rate: 116 (10/17 1856)  Labs: Recent Labs    07/28/19 1040 07/28/19 1319 07/28/19 1920 07/28/19 2320  HGB 9.2*  --   --   --   HCT 32.2*  --   --   --   PLT 299  --   --   --   APTT  --   --  132*  --   LABPROT  --   --  16.1*  --   INR  --   --  1.3*  --   HEPARINUNFRC  --   --   --  0.30  CREATININE 0.57*  --   --   --   TROPONINIHS 5 7  --   --     Estimated Creatinine Clearance: 92.6 mL/min (A) (by C-G formula based on SCr of 0.57 mg/dL (L)).   Medical History: Past Medical History:  Diagnosis Date  . Alcoholism with psychosis with complication, with hallucinations ([place])   . ED (erectile dysfunction)   . Fatigue   . History of pneumonia   . Hypercholesteremia   . Hypertension   . Lymphoma ([place]) 2008   non hodgkins lymphoma  . Rectal bleeding   . 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  Assessment: 63 y.o. male patient complains of cough and chest pain for 2 weeks. Cough started first and then the chest pain afterwards. Patient's CT comes back read as pulmonary emboli.  Pharmacy has been consulted to monitor/initiate heparin drip.    Pertinent hx includes EtOH abuse, HLD, and previous rectal bleed.   10/18 @ 2320 HL = 0.30, therapeutic at low end of goal.   Goal of Therapy:  Heparin level 0.3-0.7 units/ml Monitor platelets by anticoagulation protocol: Yes   Plan:  Will increase Heparin infusion to 1350 units/hr to discourage drop to subtherapeutic level.   Will recheck heparin  level (HL) in 6 hours after rate increase.   Will follow CBC daily per protocol while on Heparin drip.  Ena Dawley, PharmD Clinical Pharmacist 07/29/2019 12:30 AM

## 2019-07-29 NOTE — Progress Notes (Signed)
Pharmacy Electrolyte Monitoring Consult:  Pharmacy consulted to assist in monitoring and replacing electrolytes in this 63 y.o. male admitted on 07/28/2019 with Cough and Chest Pain   Labs:  Sodium (mmol/L)  Date Value  07/29/2019 142   Potassium (mmol/L)  Date Value  07/29/2019 2.8 (L)   Magnesium (mg/dL)  Date Value  07/28/2019 2.0   Phosphorus (mg/dL)  Date Value  04/28/2019 5.0 (H)   Calcium (mg/dL)  Date Value  07/29/2019 8.6 (L)   Albumin (g/dL)  Date Value  07/29/2019 2.7 (L)    Assessment/Plan: K 2.8; Mag 2.0 last night  Will order KCl 10 mEq IV x2 runs and KCl 40 mEq PO x2 doses Recheck K at 1800 today CMP and mag level in AM  Pharmacy will continue to follow.  Rocky Morel 07/29/2019 12:09 PM

## 2019-07-30 ENCOUNTER — Inpatient Hospital Stay: Payer: Self-pay

## 2019-07-30 DIAGNOSIS — R112 Nausea with vomiting, unspecified: Secondary | ICD-10-CM | POA: Diagnosis present

## 2019-07-30 LAB — CBC
HCT: 30.8 % — ABNORMAL LOW (ref 39.0–52.0)
Hemoglobin: 8.4 g/dL — ABNORMAL LOW (ref 13.0–17.0)
MCH: 22.3 pg — ABNORMAL LOW (ref 26.0–34.0)
MCHC: 27.3 g/dL — ABNORMAL LOW (ref 30.0–36.0)
MCV: 81.9 fL (ref 80.0–100.0)
Platelets: 234 10*3/uL (ref 150–400)
RBC: 3.76 MIL/uL — ABNORMAL LOW (ref 4.22–5.81)
RDW: 24 % — ABNORMAL HIGH (ref 11.5–15.5)
WBC: 7.5 10*3/uL (ref 4.0–10.5)
nRBC: 0 % (ref 0.0–0.2)

## 2019-07-30 LAB — COMPREHENSIVE METABOLIC PANEL
ALT: 42 U/L (ref 0–44)
AST: 46 U/L — ABNORMAL HIGH (ref 15–41)
Albumin: 2.6 g/dL — ABNORMAL LOW (ref 3.5–5.0)
Alkaline Phosphatase: 158 U/L — ABNORMAL HIGH (ref 38–126)
Anion gap: 3 — ABNORMAL LOW (ref 5–15)
BUN: 6 mg/dL — ABNORMAL LOW (ref 8–23)
CO2: 28 mmol/L (ref 22–32)
Calcium: 8.3 mg/dL — ABNORMAL LOW (ref 8.9–10.3)
Chloride: 109 mmol/L (ref 98–111)
Creatinine, Ser: 0.57 mg/dL — ABNORMAL LOW (ref 0.61–1.24)
GFR calc Af Amer: >60 mL/min (ref >60)
GFR calc non Af Amer: >60 mL/min (ref >60)
Glucose, Bld: 94 mg/dL (ref 70–99)
Potassium: 3.7 mmol/L (ref 3.5–5.1)
Sodium: 140 mmol/L (ref 135–145)
Total Bilirubin: 1.4 mg/dL — ABNORMAL HIGH (ref 0.3–1.2)
Total Protein: 5.2 g/dL — ABNORMAL LOW (ref 6.5–8.1)

## 2019-07-30 LAB — MAGNESIUM: Magnesium: 2.2 mg/dL (ref 1.7–2.4)

## 2019-07-30 MED ORDER — PANTOPRAZOLE SODIUM 40 MG IV SOLR
40.0000 mg | Freq: Two times a day (BID) | INTRAVENOUS | Status: DC
  Administered 2019-07-30 – 2019-08-20 (×41): 40 mg via INTRAVENOUS
  Filled 2019-07-30 (×40): qty 40

## 2019-07-30 MED ORDER — LORAZEPAM 2 MG/ML IJ SOLN
0.0000 mg | Freq: Four times a day (QID) | INTRAMUSCULAR | Status: AC
  Administered 2019-07-30: 2 mg via INTRAVENOUS
  Administered 2019-07-30: 1 mg via INTRAVENOUS
  Filled 2019-07-30 (×2): qty 1

## 2019-07-30 MED ORDER — METOPROLOL TARTRATE 5 MG/5ML IV SOLN
5.0000 mg | Freq: Four times a day (QID) | INTRAVENOUS | Status: DC | PRN
  Administered 2019-07-30 – 2019-08-28 (×2): 5 mg via INTRAVENOUS
  Filled 2019-07-30 (×3): qty 5

## 2019-07-30 MED ORDER — LORAZEPAM 2 MG/ML IJ SOLN
0.0000 mg | Freq: Two times a day (BID) | INTRAMUSCULAR | Status: AC
  Administered 2019-08-01: 2 mg via INTRAVENOUS
  Administered 2019-08-02: 4 mg via INTRAVENOUS
  Administered 2019-08-02: 2 mg via INTRAVENOUS
  Filled 2019-07-30 (×2): qty 1
  Filled 2019-07-30: qty 2

## 2019-07-30 NOTE — Progress Notes (Signed)
[name], MD [facility]   [address], [place], [place] [address] Phone: [phone] Fax : [phone]   Subjective: The patient is quite lethargic today and falls asleep shortly after being aroused.  The patient was complaining of nausea and vomiting.  He appears to be going through withdrawal at the present time.  As mentioned in his GI consult note the patient has had 2 upper endoscopies this year with one being quite recent.  Patient's liver enzymes are elevated consistent with alcoholic hepatitis.   Objective: Vital signs in last 24 hours: Vitals:   07/29/19 0436 07/29/19 1457 07/29/19 1955 07/30/19 0456  BP: 122/85 104/83 105/79 96/70  Pulse: 85 90 99 88  Resp: 16 18 20 20   Temp: 98.1 F (36.7 C) (!) 97.5 F (36.4 C) (!) 97.5 F (36.4 C) 98.4 F (36.9 C)  TempSrc: Oral Oral Oral Oral  SpO2: 93% 92% 98% 93%  Weight:      Height:       Weight change:   Intake/Output Summary (Last 24 hours) at 07/30/2019 1403 Last data filed at 07/30/2019 1308 Gross per 24 hour  Intake 231.52 ml  Output 100 ml  Net 131.52 ml     Exam: Heart:: Regular rate and rhythm, S1S2 present or without murmur or extra heart sounds Lungs: normal and clear to auscultation and percussion Abdomen: soft, nontender, normal bowel sounds   Lab Results: @LABTEST2 @ Micro Results: Recent Results (from the past 240 hour(s))  SARS CORONAVIRUS 2 (TAT 6-24 HRS) Nasopharyngeal Nasopharyngeal Swab     Status: None   Collection Time: 07/28/19  1:19 PM   Specimen: Nasopharyngeal Swab  Result Value Ref Range Status   SARS Coronavirus 2 NEGATIVE NEGATIVE Final    Comment: (NOTE) SARS-CoV-2 target nucleic acids are NOT DETECTED. The SARS-CoV-2 RNA is generally detectable in upper and lower respiratory specimens during the acute phase of infection. Negative results do not preclude SARS-CoV-2 infection, do not rule out co-infections with other pathogens, and should not be used as the sole basis  for treatment or other patient management decisions. Negative results must be combined with clinical observations, patient history, and epidemiological information. The expected result is Negative. Fact Sheet for Patients: SugarRoll.be Fact Sheet for Healthcare Providers: https://www.woods-mathews.com/ This test is not yet approved or cleared by the [place] FDA and  has been authorized for detection and/or diagnosis of SARS-CoV-2 by FDA under an Emergency Use Authorization (EUA). This EUA will remain  in effect (meaning this test can be used) for the duration of the COVID-19 declaration under Section 56 4(b)(1) of the Act, 21 U.S.C. section 360bbb-3(b)(1), unless the authorization is terminated or revoked sooner. Performed at [facility], [place] [address] [place], [place] [address]    Studies/Results: Dg Abd 1 View  Result Date: 07/29/2019 CLINICAL DATA:  Nausea and vomiting EXAM: ABDOMEN - 1 VIEW COMPARISON:  April 01, 2019 FINDINGS: There is a paucity of bowel gas limiting evaluation. Within this limitation, there is no evidence of bowel obstruction. No renal or ureteral stones noted. No free air, portal venous gas, or pneumatosis. IMPRESSION: No abnormalities identified. Electronically Signed   By: Dorise Bullion III M.D   On: 07/29/2019 15:50   Ct Angio Chest Pe W And/or Wo Contrast  Addendum Date: 07/28/2019   ADDENDUM REPORT: 07/28/2019 18:02 ADDENDUM: Study discussed by telephone with Dr. Archie Balboa in the ED on 07/28/2019 at 1755 hours. Electronically Signed   By: Genevie Ann M.D.   On:  07/28/2019 18:02   Result Date: 07/28/2019 CLINICAL DATA:  63 year old male with cough and chest pain for 2 weeks. EXAM: CT ANGIOGRAPHY CHEST WITH CONTRAST TECHNIQUE: Multidetector CT imaging of the chest was performed using the standard protocol during bolus administration of intravenous contrast. Multiplanar CT image reconstructions and MIPs were  obtained to evaluate the vascular anatomy. CONTRAST:  32mL OMNIPAQUE IOHEXOL 350 MG/ML SOLN COMPARISON:  Chest radiographs earlier today. CT Chest, Abdomen, and Pelvis 05/15/2019 FINDINGS: Cardiovascular: Adequate contrast bolus timing in the pulmonary arterial tree. Intermittent respiratory motion, severe just below the hila and affecting much of the lower lobe pulmonary artery detail. However, there is a suspicious appearance of the right lower lobe branches on series 5, image 149, which continues into image 161. And also nonocclusive thrombus visible in the right upper lobe branch on image 105 which is relatively motion free. Calcified coronary artery atherosclerosis. Negative visible aorta aside from mild atherosclerosis. Stable cardiac size, no cardiomegaly. No pericardial effusion. Mediastinum/Nodes: Mediastinal lymph node size appears stable to slightly decreased from August. Persistent circumferential thickening of the esophagus, without progression. Lungs/Pleura: Intermittent motion artifact. Stable lung volumes. No convincing abnormal pulmonary opacity today. And no pleural effusion is evident. Upper Abdomen: Severe hepatic steatosis. Thickened appearance of the gastroesophageal junction (see esophagus findings above the). Otherwise negative visible upper abdomen. Musculoskeletal: Chronic left rib fractures. Chronic right posterior 10th rib fracture. Thoracic vertebrae appear stable. Motion artifact at the sternum. No acute or suspicious osseous lesion identified. Review of the MIP images confirms the above findings. IMPRESSION: 1. Motion degraded study but positive for acute pulmonary emboli in the right lung. No associated pleural effusion or abnormal pulmonary opacity. 2. Persistent abnormal thickening of the esophagus, stable since August. This is nonspecific but esophagitis seems more likely than esophageal neoplasm now. 3. Stable to slightly decreased mediastinal lymph node size since August. 4.  Calcified coronary artery atherosclerosis. 5. Severe hepatic steatosis. Electronically Signed: By: Genevie Ann M.D. On: 07/28/2019 16:13   US Abdomen Limited  Result Date: 07/28/2019 CLINICAL DATA:  63 year old male with history of elevated liver function tests. EXAM: ULTRASOUND ABDOMEN LIMITED RIGHT UPPER QUADRANT COMPARISON:  No priors. FINDINGS: Gallbladder: No gallstones or wall thickening visualized. No sonographic Murphy sign noted by sonographer. Common bile duct: Diameter: 6 mm Liver: No focal lesion identified. Increased echogenicity throughout the hepatic parenchyma, indicative of hepatic steatosis. Portal vein is patent on color Doppler imaging with normal direction of blood flow towards the liver. Other: None. IMPRESSION: 1. No acute findings. Specifically, no cholelithiasis or evidence to suggest acute cholecystitis at this time. 2. Diffuse increased echogenicity throughout the hepatic parenchyma, indicative of hepatic steatosis. Electronically Signed   By: Vinnie Langton M.D.   On: 07/28/2019 16:02   Medications: I have reviewed the patient's current medications. Scheduled Meds: . apixaban  10 mg Oral BID   Followed by  . [START ON 08/05/2019] apixaban  5 mg Oral BID  . feeding supplement (ENSURE ENLIVE)  237 mL Oral BID BM  . folic acid  1 mg Oral Daily  . gabapentin  400 mg Oral TID  . LORazepam  0-4 mg Intravenous Q6H   Followed by  . [START ON 08/01/2019] LORazepam  0-4 mg Intravenous Q12H  . multivitamin with minerals  1 tablet Oral Daily  . nicotine  21 mg Transdermal Daily  . pantoprazole (PROTONIX) IV  40 mg Intravenous Q12H  . sodium chloride flush  3 mL Intravenous Once  . sodium chloride flush  3 mL Intravenous Q12H  . thiamine  100 mg Intramuscular Once  . thiamine  100 mg Oral Daily   Continuous Infusions: . sodium chloride Stopped (07/29/19 1654)   PRN Meds:.sodium chloride, acetaminophen **OR** acetaminophen, albuterol, bisacodyl, guaiFENesin-dextromethorphan,  HYDROcodone-acetaminophen, hydrOXYzine, loperamide, LORazepam **OR** LORazepam, ondansetron **OR** ondansetron (ZOFRAN) IV, ondansetron, promethazine, senna-docusate, sodium chloride flush   Assessment: Principal Problem:   Alcohol-induced mood disorder ([facility]) Active Problems:   Alcohol dependence with uncomplicated intoxication ([place])   Pulmonary emboli ([place])    Plan: This patient has abnormal liver enzymes consistent with alcoholic hepatitis and appears to be going through withdrawal.  The patient is not able to give any meaningful history at the present time due to his lethargy.  I would recommend continuing conservative therapy for him.  I will follow along with you.   LOS: 2 days   [name] 07/30/2019, 2:03 PM Pager [phone] 7am-5pm  Check AMION for 5pm -7am coverage and on weekends

## 2019-07-30 NOTE — Progress Notes (Signed)
Pharmacy Electrolyte Monitoring Consult:  Pharmacy consulted to assist in monitoring and replacing electrolytes in this 63 y.o. male admitted on 07/28/2019 with Cough and Chest Pain related to an acute PE   Labs:  Sodium (mmol/L)  Date Value  07/30/2019 140   Potassium (mmol/L)  Date Value  07/30/2019 3.7   Magnesium (mg/dL)  Date Value  07/30/2019 2.2   Phosphorus (mg/dL)  Date Value  04/28/2019 5.0 (H)   Calcium (mg/dL)  Date Value  07/30/2019 8.3 (L)   Albumin (g/dL)  Date Value  07/30/2019 2.6 (L)   Corrected Ca: 9.42 mg/dL  Assessment/Plan: This patient has been administered a total of 60 mEq IV KCl and 80 mEq oral KCl in the past 24 hours. Because of his alcohol use disorder he remains at a high risk for refeeding syndrome.   No further electrolyte supplementation is warranted today  Recheck electrolytes including phosphorous and magnesium tomorrow am  Pharmacy will continue to follow.  Dallie Piles, PharmD Clinical Pharmacist 07/30/2019 10:33 AM

## 2019-07-30 NOTE — Progress Notes (Addendum)
[facility] [place] NAME: George Wade    MR#:  [id number]  DATE OF BIRTH:  [birth year]  SUBJECTIVE:   Patient states he is having difficulty swallowing.  Endorses epigastric abdominal pain.  He endorses a couple episodes of vomiting.  No fevers or chills.  REVIEW OF SYSTEMS:    Review of Systems  Constitutional: Negative for fever, chills weight loss HENT: Negative for ear pain, nosebleeds, congestion, facial swelling, rhinorrhea, neck pain, neck stiffness and ear discharge.   Respiratory: Negative for cough, shortness of breath, wheezing  Cardiovascular: Negative for chest pain, palpitations and leg swelling.  Gastrointestinal: Negative for heartburn, +epigastric abdominal pain, +vomiting, no diarrhea or consitpation no melena Genitourinary: Negative for dysuria, urgency, frequency, hematuria Musculoskeletal: Negative for back pain or joint pain Neurological: Negative for dizziness, seizures, syncope, focal weakness,  numbness and headaches.  Hematological: Does not bruise/bleed easily.  Psychiatric/Behavioral: Negative for hallucinations, confusion, dysphoric mood  DRUG ALLERGIES:   Allergies  Allergen Reactions  . Sulfa Antibiotics Nausea Only    VITALS:  Blood pressure 96/70, pulse 88, temperature 98.4 F (36.9 C), temperature source Oral, resp. rate 20, height 5\' 8"  (1.727 m), weight 73.8 kg, SpO2 93 %.  PHYSICAL EXAMINATION:  Constitutional: Appears well-developed and well-nourished. No distress. HENT: Normocephalic. Marland Kitchen Oropharynx is clear and moist.  Eyes: Conjunctivae and EOM are normal. PERRLA, no scleral icterus.  Neck: Normal ROM. Neck supple. No JVD. No tracheal deviation. CVS: RRR, S1/S2 +, no murmurs, no gallops, no carotid bruit.  Pulmonary: Effort and breath sounds normal, no stridor, rhonchi, wheezes, rales.  Abdominal: Soft. BS +,  no distension, +epigastric abdominal pain, no rebound or guarding.   Musculoskeletal: Normal range of motion. No edema and no tenderness.  Neuro: Alert. CN 2-12 grossly intact. No focal deficits. Skin: Skin is warm and dry. No rash noted. Psychiatric: Normal mood and affect.   LABORATORY PANEL:   CBC Recent Labs  Lab 07/30/19 0425  WBC 7.5  HGB 8.4*  HCT 30.8*  PLT 234   ------------------------------------------------------------------------------------------------------------------  Chemistries  Recent Labs  Lab 07/30/19 0425  NA 140  K 3.7  CL 109  CO2 28  GLUCOSE 94  BUN 6*  CREATININE 0.57*  CALCIUM 8.3*  MG 2.2  AST 46*  ALT 42  ALKPHOS 158*  BILITOT 1.4*   ------------------------------------------------------------------------------------------------------------------  Cardiac Enzymes No results for input(s): TROPONINI in the last 168 hours. ------------------------------------------------------------------------------------------------------------------  RADIOLOGY:  Dg Abd 1 View  Result Date: 07/29/2019 CLINICAL DATA:  Nausea and vomiting EXAM: ABDOMEN - 1 VIEW COMPARISON:  April 01, 2019 FINDINGS: There is a paucity of bowel gas limiting evaluation. Within this limitation, there is no evidence of bowel obstruction. No renal or ureteral stones noted. No free air, portal venous gas, or pneumatosis. IMPRESSION: No abnormalities identified. Electronically Signed   By: Dorise Bullion III M.D   On: 07/29/2019 15:50   Ct Angio Chest Pe W And/or Wo Contrast  Addendum Date: 07/28/2019   ADDENDUM REPORT: 07/28/2019 18:02 ADDENDUM: Study discussed by telephone with Dr. Archie Balboa in the ED on 07/28/2019 at 1755 hours. Electronically Signed   By: Genevie Ann M.D.   On: 07/28/2019 18:02   Result Date: 07/28/2019 CLINICAL DATA:  63 year old male with cough and chest pain for 2 weeks. EXAM: CT ANGIOGRAPHY CHEST WITH CONTRAST TECHNIQUE: Multidetector CT imaging of the chest was performed using the standard protocol during bolus  administration of intravenous contrast. Multiplanar CT image reconstructions and  MIPs were obtained to evaluate the vascular anatomy. CONTRAST:  54mL OMNIPAQUE IOHEXOL 350 MG/ML SOLN COMPARISON:  Chest radiographs earlier today. CT Chest, Abdomen, and Pelvis 05/15/2019 FINDINGS: Cardiovascular: Adequate contrast bolus timing in the pulmonary arterial tree. Intermittent respiratory motion, severe just below the hila and affecting much of the lower lobe pulmonary artery detail. However, there is a suspicious appearance of the right lower lobe branches on series 5, image 149, which continues into image 161. And also nonocclusive thrombus visible in the right upper lobe branch on image 105 which is relatively motion free. Calcified coronary artery atherosclerosis. Negative visible aorta aside from mild atherosclerosis. Stable cardiac size, no cardiomegaly. No pericardial effusion. Mediastinum/Nodes: Mediastinal lymph node size appears stable to slightly decreased from August. Persistent circumferential thickening of the esophagus, without progression. Lungs/Pleura: Intermittent motion artifact. Stable lung volumes. No convincing abnormal pulmonary opacity today. And no pleural effusion is evident. Upper Abdomen: Severe hepatic steatosis. Thickened appearance of the gastroesophageal junction (see esophagus findings above the). Otherwise negative visible upper abdomen. Musculoskeletal: Chronic left rib fractures. Chronic right posterior 10th rib fracture. Thoracic vertebrae appear stable. Motion artifact at the sternum. No acute or suspicious osseous lesion identified. Review of the MIP images confirms the above findings. IMPRESSION: 1. Motion degraded study but positive for acute pulmonary emboli in the right lung. No associated pleural effusion or abnormal pulmonary opacity. 2. Persistent abnormal thickening of the esophagus, stable since August. This is nonspecific but esophagitis seems more likely than esophageal  neoplasm now. 3. Stable to slightly decreased mediastinal lymph node size since August. 4. Calcified coronary artery atherosclerosis. 5. Severe hepatic steatosis. Electronically Signed: By: Genevie Ann M.D. On: 07/28/2019 16:13   US Abdomen Limited  Result Date: 07/28/2019 CLINICAL DATA:  63 year old male with history of elevated liver function tests. EXAM: ULTRASOUND ABDOMEN LIMITED RIGHT UPPER QUADRANT COMPARISON:  No priors. FINDINGS: Gallbladder: No gallstones or wall thickening visualized. No sonographic Murphy sign noted by sonographer. Common bile duct: Diameter: 6 mm Liver: No focal lesion identified. Increased echogenicity throughout the hepatic parenchyma, indicative of hepatic steatosis. Portal vein is patent on color Doppler imaging with normal direction of blood flow towards the liver. Other: None. IMPRESSION: 1. No acute findings. Specifically, no cholelithiasis or evidence to suggest acute cholecystitis at this time. 2. Diffuse increased echogenicity throughout the hepatic parenchyma, indicative of hepatic steatosis. Electronically Signed   By: Vinnie Langton M.D.   On: 07/28/2019 16:02     ASSESSMENT AND PLAN:   Small right lung pulmonary emboli seen on CT scan- improving.  Stable on room air.  -Continue Eliquis -PT eval pending  Chronic iron deficiency anemia- hemoglobin at baseline -s/p IV iron x1 on 10/18 -Continue to monitor  Dysphagia- may be related to uncontrolled GERD.  Patient had a choking episode when trying to take p.o. meds this morning. -Recent EGD 8/20 with ulcerated esophageal mucosa -SLP eval -Keep n.p.o. until seen by SLP -Change PPI to IV due to inability to take po meds  EtOH abuse/withdrawal -Continue CIWA protocol  Elevated LFTs- likely due to alcohol use.  AST/ALT are improving. -Right upper quadrant ultrasound showed hepatic steatosis -Monitor  Tobacco use -Nicotine patch ordered  Management plans discussed with the patient and he is in  agreement.  CODE STATUS: full  TOTAL TIME TAKING CARE OF THIS PATIENT: 40 minutes.   POSSIBLE D/C 1-2 days, DEPENDING ON CLINICAL CONDITION.   [name] Mayo M.D on 07/30/2019 at 2:23 PM  Between 7am to 6pm -  Pager - [phone]  After 6pm go to [email or web address] - password [email or web address] [facility]  Office  [phone]  CC: Primary care physician; Patient, [name] Per  Note: This dictation was prepared with Dragon dictation along with smaller phrase technology. Any transcriptional errors that result from this process are unintentional.

## 2019-07-30 NOTE — Progress Notes (Signed)
Initial Nutrition Assessment  DOCUMENTATION CODES:   Non-severe (moderate) malnutrition in context of social or environmental circumstances  INTERVENTION:  Will discontinue Ensure Enlive as patient is now NPO.  Will monitor for diet advancement per SLP recommendations and recommend appropriate oral nutrition supplement at that time.  Monitor magnesium, potassium, and phosphorus daily for at least 3 days, MD to replete as needed, as pt is at risk for refeeding syndrome given malnutrition, hx EtOH abuse.  NUTRITION DIAGNOSIS:   Moderate Malnutrition related to social / environmental circumstances(EtOH abuse, limited access to well-balanced meals) as evidenced by mild-moderate fat depletion, mild-moderate muscle depletion.  GOAL:   Patient will meet greater than or equal to 90% of their needs  MONITOR:   Diet advancement, Labs, Weight trends, I & O's  REASON FOR ASSESSMENT:   Consult Assessment of nutrition requirement/status  ASSESSMENT:   63 year old male with PMHx of HTN, hx non-Hodgkin's lymphoma, hx tonsillar cancer, EtOH abuse admitted with small right lung pulmonary emboli, chronic iron deficiency anemia, dysphagia.   Met with patient at bedside. He reports he has had a decreased appetite and intake PTA. He reports he lives in a truck so he has no way to cook foods. He eats processed foods out of the can. He mainly will have canned beans, canned beans with sausage, Spam, corn, and other similar foods. Patient is upset that he cannot eat today. Discussed that he needs a swallow evaluation prior to being able to eat to make sure he is swallowing safely.  Patient reports his UBW was 172 lbs and he has lost about 10 lbs. Per chart he was 77.6 kg on 06/14/2019. He is now 73.8 kg (162.8 lbs). He has lost 3.8 kg (4.9% body weight) over the past month, which is not quite significant for time frame but is still concerning.  Medications reviewed and include: Eliquis, folic acid 1 mg  daily, gabapentin, Ativan, MVI daily, nicotine patch, pantoprazole, thiamine 100 mg daily.  Labs reviewed: BUN 6, Creatinine 0.57.  NUTRITION - FOCUSED PHYSICAL EXAM:    Most Recent Value  Orbital Region  Moderate depletion  Upper Arm Region  Moderate depletion  Thoracic and Lumbar Region  Mild depletion  Buccal Region  Mild depletion  Temple Region  Moderate depletion  Clavicle Bone Region  Moderate depletion  Clavicle and Acromion Bone Region  Mild depletion  Scapular Bone Region  Mild depletion  Dorsal Hand  Moderate depletion  Patellar Region  Moderate depletion  Anterior Thigh Region  Moderate depletion  Posterior Calf Region  Moderate depletion  Edema (RD Assessment)  None  Hair  Reviewed  Eyes  Reviewed  Mouth  Reviewed  Skin  Reviewed  Nails  Reviewed     Diet Order:   Diet Order            Diet NPO time specified  Diet effective now             EDUCATION NEEDS:   No education needs have been identified at this time  Skin:  Skin Assessment: Reviewed RN Assessment  Last BM:  07/29/2019 per chart  Height:   Ht Readings from Last 1 Encounters:  07/28/19 5' 8" (1.727 m)   Weight:   Wt Readings from Last 1 Encounters:  07/28/19 73.8 kg   Ideal Body Weight:  70 kg  BMI:  Body mass index is 24.75 kg/m.  Estimated Nutritional Needs:   Kcal:  1800-2000  Protein:  90-100 grams  Fluid:  1.8-2 L/day  Willey Blade, MS, RD, LDN Office: [phone] Pager: [phone] After Hours/Weekend Pager: [phone]

## 2019-07-30 NOTE — Progress Notes (Signed)
PT Cancellation Note  Patient Details Name: George Wade. MRN: [id number] DOB: 1956/06/07   Cancelled Treatment:    Reason Eval/Treat Not Completed: Fatigue/lethargy limiting ability to participate(Consult received and chart reviewed. Per discussion with primary RN, patient generally lethargic and unable to actively participate with session (due to medications).  Will continue to follow and re-attempt next date as medically appropriate.)  Gerlad Pelzel H. Owens Shark, PT, DPT, NCS 07/30/19, 1:58 PM [phone]

## 2019-07-30 NOTE — Progress Notes (Signed)
SLP Cancellation Note  Patient Details Name: George Wade. MRN: [id number] DOB: Aug 28, 1956   Cancelled treatment:       Reason Eval/Treat Not Completed: Fatigue/lethargy limiting ability to participate;Patient not medically ready(chart reviewed; consulted NSG then attempted to see pt).  Attempted to see pt, however, he was too lethargic and could not maintain alertness -- he only mumbled x1 to greeting. 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 Per EGD in 05/2019, pt had "Diffuse severe mucosal changes characterized by sloughing and ulceration were found in the middle third of the esophagus and in the lower third of the esophagus". Suspect this condition could significantly impact swallowing - unsure if pt was able to follow through w/ tx and management w/ GI.  Per chart history, pt has a long-standing h/o dysphagia per [facility] at [facility] on 07/04/2019; FEES at [facility] 08/2028 - both describing transient, silent laryngeal Penetration felt to be significantly impacted by the "heavy drinking"/ETOH abuse and the "ulcerated mucosa in the esophagus" impacting motility/clearing.  ST services will f/u this PM if/when pt is alert/awake to safely participate in po intake/trials. Alerted NSG to monitor the same and to hold on any po's if pt's is not fully awake to safely engage w/ task. Recommend frequent oral care for hygiene and stimulation of swallowing. Aspiration precautions.     Orinda Kenner, MS, CCC-SLP Michaeal Davis 07/30/2019, 10:38 AM

## 2019-07-31 ENCOUNTER — Inpatient Hospital Stay: Payer: Self-pay

## 2019-07-31 DIAGNOSIS — E440 Moderate protein-calorie malnutrition: Secondary | ICD-10-CM | POA: Diagnosis present

## 2019-07-31 DIAGNOSIS — F1094 Alcohol use, unspecified with alcohol-induced mood disorder: Secondary | ICD-10-CM

## 2019-07-31 DIAGNOSIS — J9601 Acute respiratory failure with hypoxia: Secondary | ICD-10-CM

## 2019-07-31 DIAGNOSIS — I2602 Saddle embolus of pulmonary artery with acute cor pulmonale: Secondary | ICD-10-CM

## 2019-07-31 LAB — PROCALCITONIN: Procalcitonin: 2.67 ng/mL

## 2019-07-31 LAB — RENAL FUNCTION PANEL
Albumin: 2.7 g/dL — ABNORMAL LOW (ref 3.5–5.0)
Anion gap: 4 — ABNORMAL LOW (ref 5–15)
BUN: 8 mg/dL (ref 8–23)
CO2: 25 mmol/L (ref 22–32)
Calcium: 8.4 mg/dL — ABNORMAL LOW (ref 8.9–10.3)
Chloride: 111 mmol/L (ref 98–111)
Creatinine, Ser: 0.67 mg/dL (ref 0.61–1.24)
GFR calc Af Amer: >60 mL/min (ref >60)
GFR calc non Af Amer: >60 mL/min (ref >60)
Glucose, Bld: 119 mg/dL — ABNORMAL HIGH (ref 70–99)
Phosphorus: 2.4 mg/dL — ABNORMAL LOW (ref 2.5–4.6)
Potassium: 4.1 mmol/L (ref 3.5–5.1)
Sodium: 140 mmol/L (ref 135–145)

## 2019-07-31 LAB — CBC
HCT: 30.9 % — ABNORMAL LOW (ref 39.0–52.0)
Hemoglobin: 8.7 g/dL — ABNORMAL LOW (ref 13.0–17.0)
MCH: 22.8 pg — ABNORMAL LOW (ref 26.0–34.0)
MCHC: 28.2 g/dL — ABNORMAL LOW (ref 30.0–36.0)
MCV: 81.1 fL (ref 80.0–100.0)
Platelets: 332 10*3/uL (ref 150–400)
RBC: 3.81 MIL/uL — ABNORMAL LOW (ref 4.22–5.81)
RDW: 24.5 % — ABNORMAL HIGH (ref 11.5–15.5)
WBC: 23.2 10*3/uL — ABNORMAL HIGH (ref 4.0–10.5)
nRBC: 0.1 % (ref 0.0–0.2)

## 2019-07-31 LAB — MRSA PCR SCREENING: MRSA by PCR: NEGATIVE

## 2019-07-31 LAB — GLUCOSE, CAPILLARY: Glucose-Capillary: 119 mg/dL — ABNORMAL HIGH (ref 70–99)

## 2019-07-31 LAB — MAGNESIUM: Magnesium: 2 mg/dL (ref 1.7–2.4)

## 2019-07-31 MED ORDER — FENTANYL CITRATE (PF) 100 MCG/2ML IJ SOLN
INTRAMUSCULAR | Status: AC
  Filled 2019-07-31: qty 2

## 2019-07-31 MED ORDER — METHYLPREDNISOLONE SODIUM SUCC 40 MG IJ SOLR
40.0000 mg | Freq: Once | INTRAMUSCULAR | Status: AC
  Administered 2019-07-31: 40 mg via INTRAVENOUS
  Filled 2019-07-31: qty 1

## 2019-07-31 MED ORDER — ENSURE ENLIVE PO LIQD
237.0000 mL | Freq: Two times a day (BID) | ORAL | Status: DC
  Administered 2019-07-31: 237 mL via ORAL

## 2019-07-31 MED ORDER — IPRATROPIUM-ALBUTEROL 0.5-2.5 (3) MG/3ML IN SOLN
3.0000 mL | RESPIRATORY_TRACT | Status: DC
  Administered 2019-07-31 – 2019-08-07 (×39): 3 mL via RESPIRATORY_TRACT
  Filled 2019-07-31 (×39): qty 3

## 2019-07-31 MED ORDER — METHYLPREDNISOLONE SODIUM SUCC 125 MG IJ SOLR
60.0000 mg | Freq: Once | INTRAMUSCULAR | Status: AC
  Administered 2019-07-31: 13:00:00 60 mg via INTRAVENOUS

## 2019-07-31 MED ORDER — POTASSIUM PHOSPHATE MONOBASIC 500 MG PO TABS
500.0000 mg | ORAL_TABLET | Freq: Once | ORAL | Status: DC
  Filled 2019-07-31: qty 1

## 2019-07-31 MED ORDER — METHYLPREDNISOLONE SODIUM SUCC 125 MG IJ SOLR
INTRAMUSCULAR | Status: AC
  Administered 2019-07-31: 60 mg via INTRAVENOUS
  Filled 2019-07-31: qty 2

## 2019-07-31 MED ORDER — SODIUM CHLORIDE 0.9 % IV SOLN
3.0000 g | Freq: Four times a day (QID) | INTRAVENOUS | Status: DC
  Administered 2019-07-31 – 2019-08-09 (×36): 3 g via INTRAVENOUS
  Filled 2019-07-31 (×10): qty 3
  Filled 2019-07-31: qty 8
  Filled 2019-07-31 (×2): qty 3
  Filled 2019-07-31: qty 8
  Filled 2019-07-31 (×3): qty 3
  Filled 2019-07-31: qty 8
  Filled 2019-07-31: qty 3
  Filled 2019-07-31: qty 8
  Filled 2019-07-31 (×6): qty 3
  Filled 2019-07-31: qty 8
  Filled 2019-07-31 (×3): qty 3
  Filled 2019-07-31: qty 8
  Filled 2019-07-31: qty 3
  Filled 2019-07-31: qty 8
  Filled 2019-07-31 (×4): qty 3
  Filled 2019-07-31 (×2): qty 8

## 2019-07-31 MED ORDER — MIDAZOLAM HCL 2 MG/2ML IJ SOLN
INTRAMUSCULAR | Status: AC
  Filled 2019-07-31: qty 4

## 2019-07-31 MED ORDER — VECURONIUM BROMIDE 10 MG IV SOLR
INTRAVENOUS | Status: AC
  Filled 2019-07-31: qty 10

## 2019-07-31 MED ORDER — CHLORHEXIDINE GLUCONATE CLOTH 2 % EX PADS
6.0000 | MEDICATED_PAD | Freq: Every day | CUTANEOUS | Status: DC
  Administered 2019-08-01 – 2019-08-10 (×7): 6 via TOPICAL

## 2019-07-31 NOTE — Progress Notes (Signed)
This RN returned from lunch, pt in room coughing up large amounts of vomit. Pt reports that he had eaten and then became nauseated and vomited. Pt constantly coughing, large amounts of tan/brown intermittently being coughed up. Pt reports difficulty breathing, when not coughing RR 40-45 BPM. O2 increased to 6LNC, sats 88-90%. Dr. Mortimer Fries notified and to bedside. Meds ordered, pt changed to heated HFNC.

## 2019-07-31 NOTE — Progress Notes (Signed)
Placed pt on HFNC at 15L. Pt Spo2 93%, RR 24 at this time. Bipap is in room on Stand by per conversation with NP Marda Stalker.

## 2019-07-31 NOTE — Progress Notes (Signed)
Call to room by RN , assessed pt bbs clear RR16 hr 110 ,n/c to 6 lpm , sat improved to 94% pt is alert , RN made aware .

## 2019-07-31 NOTE — Progress Notes (Signed)
Pharmacy Antibiotic Note  Tyrelle Raczka. is a 63 y.o. male admitted on 07/28/2019 with aspiration pneumonia.  Pharmacy has been consulted for Unasyn dosing.  Plan: Will start Unasyn 3g IV q6h for aspiration pneumonia and continue to monitor renal function  Height: 5\' 8"  (172.7 cm) Weight: 162 lb 12.8 oz (73.8 kg) IBW/kg (Calculated) : 68.4  Temp (24hrs), Avg:98.4 F (36.9 C), Min:98 F (36.7 C), Max:98.6 F (37 C)  Recent Labs  Lab 07/28/19 1040 07/29/19 0434 07/29/19 0730 07/30/19 0425 07/31/19 0352  WBC 8.6 8.6  --  7.5 23.2*  CREATININE 0.57*  --  0.69 0.57* 0.67    Estimated Creatinine Clearance: 92.6 mL/min (by C-G formula based on SCr of 0.67 mg/dL).    Allergies  Allergen Reactions  . Sulfa Antibiotics Nausea Only    Thank you for allowing pharmacy to be a part of this patient's care.  Tobie Lords, PharmD, BCPS Clinical Pharmacist 07/31/2019 6:20 AM

## 2019-07-31 NOTE — Progress Notes (Signed)
Called Dr. Jannifer Franklin regarding patient's HR of 160. Chest xray and EKG was ordered. Patient given 53mL of metoprolol. Heart rate is now 108 but patient's oxygen saturation is 94% on 6L. Will continue to monitor.  George Wade

## 2019-07-31 NOTE — Progress Notes (Signed)
Patient being transferred to ICU 3 due to rapid respirations and oxygen saturation of 93% on non-rebreather.Patient is also on CIWA. Report given to [facility]. Patient will most likely be place on bipap.  Christene Slates 07/31/2019  5:45 AM

## 2019-07-31 NOTE — Progress Notes (Signed)
CRITICAL CARE NOTE  CC  follow up respiratory failure  SUBJECTIVE Acute aspiration of gastric contents severe vomiting Severe hypoxia and increased WOB +wheezing and Rhonchi  +critically ill High risk for intubation   BP 100/69   Pulse 92   Temp 98.8 F (37.1 C) (Oral)   Resp (!) 27   Ht 5\' 8"  (1.727 m)   Wt 73.8 kg   SpO2 98%   BMI 24.75 kg/m    I/O last 3 completed shifts: In: 3 [I.V.:3] Out: 100 [Urine:100] Total I/O In: 123 [P.O.:120; I.V.:3] Out: -   SpO2: 98 % O2 Flow Rate (L/min): (S) 2 L/min FiO2 (%): 100 %   SIGNIFICANT EVENTS   Review of Systems:  Gen:  Denies  fever, sweats, chills weight loss  HEENT: Denies blurred vision, double vision, ear pain, eye pain, hearing loss, nose bleeds, sore throat Cardiac:  No dizziness, chest pain or heaviness, chest tightness,edema, No JVD Resp:   +cough, +sputum production, +shortness of breath,+wheezing, -hemoptysis,  Gi: Denies swallowing difficulty, stomach pain, nausea or vomiting, diarrhea, constipation, bowel incontinence Gu:  Denies bladder incontinence, burning urine Other:  All other systems negative   PHYSICAL EXAMINATION:  GENERAL:critically ill appearing, +resp distress HEAD: Normocephalic, atraumatic.  EYES: Pupils equal, round, reactive to light.  No scleral icterus.  MOUTH: Moist mucosal membrane. NECK: Supple.  PULMONARY: +rhonchi, +wheezing CARDIOVASCULAR: S1 and S2. Regular rate and rhythm. No murmurs, rubs, or gallops.  GASTROINTESTINAL: Soft, nontender, -distended. No masses. Positive bowel sounds. No hepatosplenomegaly.  MUSCULOSKELETAL: No swelling, clubbing, or edema.  NEUROLOGIC: alert, awake SKIN:intact,warm,dry  MEDICATIONS: I have reviewed all medications and confirmed regimen as documented   CULTURE RESULTS   Recent Results (from the past 240 hour(s))  SARS CORONAVIRUS 2 (TAT 6-24 HRS) Nasopharyngeal Nasopharyngeal Swab     Status: None   Collection Time: 07/28/19  1:19  PM   Specimen: Nasopharyngeal Swab  Result Value Ref Range Status   SARS Coronavirus 2 NEGATIVE NEGATIVE Final    Comment: (NOTE) SARS-CoV-2 target nucleic acids are NOT DETECTED. The SARS-CoV-2 RNA is generally detectable in upper and lower respiratory specimens during the acute phase of infection. Negative results do not preclude SARS-CoV-2 infection, do not rule out co-infections with other pathogens, and should not be used as the sole basis for treatment or other patient management decisions. Negative results must be combined with clinical observations, patient history, and epidemiological information. The expected result is Negative. Fact Sheet for Patients: SugarRoll.be Fact Sheet for Healthcare Providers: https://www.woods-mathews.com/ This test is not yet approved or cleared by the [place] FDA and  has been authorized for detection and/or diagnosis of SARS-CoV-2 by FDA under an Emergency Use Authorization (EUA). This EUA will remain  in effect (meaning this test can be used) for the duration of the COVID-19 declaration under Section 56 4(b)(1) of the Act, 21 U.S.C. section 360bbb-3(b)(1), unless the authorization is terminated or revoked sooner. Performed at [facility], [place] [address], [place],  [address]   MRSA PCR Screening     Status: None   Collection Time: 07/31/19  6:20 AM   Specimen: Nasopharyngeal  Result Value Ref Range Status   MRSA by PCR NEGATIVE NEGATIVE Final    Comment:        The GeneXpert MRSA Assay (FDA approved for NASAL specimens only), is one component of a comprehensive MRSA colonization surveillance program. It is not intended to diagnose MRSA infection nor to guide or monitor treatment for MRSA infections.  Performed at [facility], [address] [place], [place] [address]           IMAGING    Dg Chest Port 1 View  Result Date: 07/30/2019 CLINICAL DATA:   Cough EXAM: PORTABLE CHEST 1 VIEW COMPARISON:  07/28/2019 FINDINGS: Mild cardiomegaly. Both lungs are clear. The visualized skeletal structures are unremarkable. IMPRESSION: Mild cardiomegaly without acute abnormality of the lungs in AP portable projection. Electronically Signed   By: Eddie Candle M.D.   On: 07/30/2019 21:50       ASSESSMENT AND PLAN SYNOPSIS   Severe ACUTE Hypoxic and Hypercapnic Respiratory Failure Acute aspiration of gastric contents High risk for intubation Place on high flow [place] Start DOUNEBS SOLUMEDROL 60 x 1 aggressive chest PT and suctioning   If patient does not improve, will plan for intubation   CARDIAC ICU monitoring  ID -continue IV abx as prescibed -follow up cultures  GI GI PROPHYLAXIS as indicated  NUTRITIONAL STATUS DIET-->keep NPO Constipation protocol as indicated  ENDO - will use ICU hypoglycemic\Hyperglycemia protocol if indicated   ELECTROLYTES -follow labs as needed -replace as needed -pharmacy consultation and following   DVT/GI PRX ordered TRANSFUSIONS AS NEEDED MONITOR FSBS ASSESS the need for LABS as needed   Critical Care Time devoted to patient care services described in this note is 35 minutes.   Overall, patient is critically ill, prognosis is guarded.   high risk for cardiac arrest and intubation   Maretta Bees Patricia Pesa, M.D.  [facility]  Medical Director [place] Director [facility] Cardio-Pulmonary Department

## 2019-07-31 NOTE — Progress Notes (Signed)
Pharmacy Electrolyte Monitoring Consult:  63 y.o. male admitted on 07/28/2019 with Cough and Chest Pain related to an acute PE.  Because of his alcohol use disorder he remains at a high risk for refeeding syndrome.    Labs:  Sodium (mmol/L)  Date Value  07/31/2019 140   Potassium (mmol/L)  Date Value  07/31/2019 4.1   Magnesium (mg/dL)  Date Value  07/31/2019 2.0   Phosphorus (mg/dL)  Date Value  07/31/2019 2.4 (L)   Calcium (mg/dL)  Date Value  07/31/2019 8.4 (L)   Albumin (g/dL)  Date Value  07/31/2019 2.7 (L)   Corrected Ca: 9.44 mg/dL  Plan:   K-Phos 500 mg tablet po x1  Recheck electrolytes including phosphorous and magnesium tomorrow am  Pharmacy will continue to follow.  Sallye Lat, PharmD Candidate 07/31/2019 11:32 AM

## 2019-07-31 NOTE — Progress Notes (Signed)
PT Cancellation Note  Patient Details Name: George Wade. MRN: [id number] DOB: Nov 17, 1955   Cancelled Treatment:    Reason Eval/Treat Not Completed: Other (comment)(Chart reviewed, pt transferred to CCU AM 10/20 for change in respiratory status, per PT guidelines PT to complete orders, please re-consult when patient is medically appropriate to participate in rehab services.)  Lieutenant Diego PT, DPT 8:28 AM,07/31/19 [phone]

## 2019-07-31 NOTE — Progress Notes (Signed)
[facility] NAME: George Wade    MR#:  [id number]  DATE OF BIRTH:  03/21/1956  SUBJECTIVE:   Patient states he is short of breath this morning. He endorses coughing and choking with eating. He is still having epigastric abdominal pain. Transferred to the ICU overnight due to worsening respiratory status.  REVIEW OF SYSTEMS:    Review of Systems  Constitutional: Negative for fever, chills weight loss HENT: Negative for ear pain, nosebleeds, congestion, facial swelling, rhinorrhea, neck pain, neck stiffness and ear discharge.   Respiratory: Negative for cough, shortness of breath, wheezing  Cardiovascular: Negative for chest pain, palpitations and leg swelling.  Gastrointestinal: Negative for heartburn, +epigastric abdominal pain, +vomiting, no diarrhea or consitpation no melena Genitourinary: Negative for dysuria, urgency, frequency, hematuria Musculoskeletal: Negative for back pain or joint pain Neurological: Negative for dizziness, seizures, syncope, focal weakness,  numbness and headaches.  Hematological: Does not bruise/bleed easily.  Psychiatric/Behavioral: Negative for hallucinations, confusion, dysphoric mood  DRUG ALLERGIES:   Allergies  Allergen Reactions  . Sulfa Antibiotics Nausea Only    VITALS:  Blood pressure 111/73, pulse 98, temperature 98.8 F (37.1 C), temperature source Oral, resp. rate (!) 24, height 5\' 8"  (1.727 m), weight 73.8 kg, SpO2 97 %.  PHYSICAL EXAMINATION:  Constitutional: Appears well-developed and well-nourished. No distress. HENT: Normocephalic. Marland Kitchen Oropharynx is clear and moist.  Eyes: Conjunctivae and EOM are normal. PERRLA, no scleral icterus.  Neck: Normal ROM. Neck supple. No JVD. No tracheal deviation. CVS: RRR, S1/S2 +, no murmurs, no gallops, no carotid bruit.  Pulmonary: Effort and breath sounds normal, no stridor, rhonchi, wheezes, rales. +diminished breath sounds in the lung bases  bilaterally. HFNC in place. Abdominal: Soft. BS +,  no distension, +epigastric abdominal pain, no rebound or guarding.  Musculoskeletal: Normal range of motion. No edema and no tenderness.  Neuro: Alert. CN 2-12 grossly intact. No focal deficits. Skin: Skin is warm and dry. No rash noted. Psychiatric: Normal mood and affect.   LABORATORY PANEL:   CBC Recent Labs  Lab 07/31/19 0352  WBC 23.2*  HGB 8.7*  HCT 30.9*  PLT 332   ------------------------------------------------------------------------------------------------------------------  Chemistries  Recent Labs  Lab 07/30/19 0425 07/31/19 0352  NA 140 140  K 3.7 4.1  CL 109 111  CO2 28 25  GLUCOSE 94 119*  BUN 6* 8  CREATININE 0.57* 0.67  CALCIUM 8.3* 8.4*  MG 2.2 2.0  AST 46*  --   ALT 42  --   ALKPHOS 158*  --   BILITOT 1.4*  --    ------------------------------------------------------------------------------------------------------------------  Cardiac Enzymes No results for input(s): TROPONINI in the last 168 hours. ------------------------------------------------------------------------------------------------------------------  RADIOLOGY:  Dg Chest Port 1 View  Result Date: 07/31/2019 CLINICAL DATA:  Recent aspiration EXAM: PORTABLE CHEST 1 VIEW COMPARISON:  07/30/2019 FINDINGS: Cardiac shadow is stable. Aortic calcifications are again seen. Increased basilar density is noted bilaterally consistent with the given clinical history. No bony abnormality is seen. IMPRESSION: Bibasilar density consistent with the given clinical history of aspiration. Electronically Signed   By: Inez Catalina M.D.   On: 07/31/2019 13:21   Dg Chest Port 1 View  Result Date: 07/30/2019 CLINICAL DATA:  Cough EXAM: PORTABLE CHEST 1 VIEW COMPARISON:  07/28/2019 FINDINGS: Mild cardiomegaly. Both lungs are clear. The visualized skeletal structures are unremarkable. IMPRESSION: Mild cardiomegaly without acute abnormality of the lungs in  AP portable projection. Electronically Signed   By: Dorna Bloom.D.  On: 07/30/2019 21:50     ASSESSMENT AND PLAN:   Severe acute hypxoic and hypercapnic respiratory failure due to aspiration pneumonitis- patient is on HFNC today. -Continue HFNC -Continue duonebs -Received solumedrol x 1 today -SLP consult  Small right lung pulmonary emboli seen on CT scan- improving.  Stable on room air.  -Continue Eliquis -PT eval pending   Chronic iron deficiency anemia- hemoglobin at baseline -s/p IV iron x1 on 10/18 -Continue to monitor  EtOH abuse/withdrawal -Continue CIWA protocol -Precedex ron  Elevated LFTs- likely due to alcohol use.  AST/ALT are improving. -Right upper quadrant ultrasound showed hepatic steatosis -Monitor  Tobacco use -Nicotine patch ordered  Management plans discussed with the patient and he is in agreement.  CODE STATUS: full  TOTAL TIME TAKING CARE OF THIS PATIENT: 38 minutes.   POSSIBLE D/C 1-2 days, DEPENDING ON CLINICAL CONDITION.   [name] Markee Remlinger M.D on 07/31/2019 at 3:53 PM  Between 7am to 6pm - Pager [phone]  After 6pm go to [email or web address] - password [email or web address]  Office  [phone]  CC: Primary care physician; Patient, [name] Per  Note: This dictation was prepared with Dragon dictation along with smaller phrase technology. Any transcriptional errors that result from this process are unintentional.

## 2019-07-31 NOTE — Progress Notes (Signed)
Pt transfer from [facility] for sob and bipap. Pt now on HFNC at 15L Pt oriented, vss, condom cath in place. PIV in place.

## 2019-07-31 NOTE — Evaluation (Signed)
Clinical/Bedside Swallow Evaluation Patient Details  Name: George Wade. MRN: [id number] Date of Birth: Jul 26, 1956  Today's Date: 07/31/2019 Time: SLP Start Time (ACUTE ONLY): 0845 SLP Stop Time (ACUTE ONLY): 0945 SLP Time Calculation (min) (ACUTE ONLY): 60 min  Past Medical History:  Past Medical History:  Diagnosis Date  . Alcoholism with psychosis with complication, with hallucinations ([place])   . ED (erectile dysfunction)   . Fatigue   . History of pneumonia   . Hypercholesteremia   . Hypertension   . Lymphoma ([place]) 2008   non hodgkins lymphoma  . Rectal bleeding   . Substance abuse ([place])    ethanol  . 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[facility])    Past Surgical History:  Past Surgical History:  Procedure Laterality Date  . ESOPHAGOGASTRODUODENOSCOPY N/A 05/18/2019   Procedure: ESOPHAGOGASTRODUODENOSCOPY (EGD);  Surgeon: Lin Landsman, MD;  Location: [facility] ENDOSCOPY;  Service: Gastroenterology;  Laterality: N/A;  . [place]  . JOINT REPLACEMENT    . REPLACEMENT TOTAL KNEE BILATERAL  2009  . TONSILLECTOMY  2008   with neck dissection    HPI:  Pt is a 63 y.o. male patient who arrives at the ED w/ complaints of cough and chest pain for 2 weeks. Cough started first and then the chest pain afterwards. States the chest pain is constant the center of his chest; radiates down his left arm; from the bottom of his neck down into the sternum. It only occurs when he swallows liquids or solids. 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 On review of patient's prior records, he has a history of GI bleed with EGD performed at [facility] on 09/29/2018 finding SEVERE ulcerating Esophagitis of the ENTIRE mid and distal esophagus. Pt has had multiple swallow studies(MBSSs) at  this facility and [facility] demonstrating Silent laryngeal penetration and aspiration were noted w/ liquids; food residue on a previous study w/ recommendations most recently for a dysphagia diet w/ Thickened liquids -- unsure if pt has been following this recommended diet to reduce risk for aspiration. Pt had a recent EGD in 05/2019 revealing: Diffuse severe mucosal changes characterized by sloughing and ulceration were found in the middle third of the esophagus and in the lower third of the esophagus. Suspect pt's h/o Head/Neck Ca, ongoing ETOH abuse, and SEVERE Esophagitis episodes per EGD contribute to his decreased pharyngeal sensation, delayed pharyngeal swallow initiation, and increased risk for larynageal penetration and aspiration. In the past, pt has been noncompliant w/ the recommended dysphagia diets - he requests thin liquids to drink. Pt awake today, verbally conversive. Pt has had baseline Cognitive decline in past admissions - unsure of his baseline.   Assessment / Plan / Recommendation Clinical Impression  Pt has a h/o pharyngeal phase dysphagia w/ laryngeal penetration and aspiration demonstrated via objective swallow assessment(MBSS) at this facility and previous facilities. Pt has been recommended to be on a dysphagia diet including Nectar consistency liquids last admission at this facility - he stated he was "not drinking that" prior to admission. 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 Prior to and during the exam, pt consumed  thin liquids (w/ NSG earlier in morning) w/ no immediate, overt clinical s/s of aspiration - no immediate decline in respiratory status(O2 sats 87%, RR low 20s). Vocal quality remained clear. Knowing pt's h/o dysphagia and  penetration/aspiration, pt was instructed to use frequent throat clearing/cough which he did w/ min verbal cues as reminders intermittently. Mild throat clearing was noted which suspect was not intentional x1-2. Similar was noted w/ trials of puree and soft solids. No gross oral phase deficits noted; timely mastication and A-P transfer noted; pt obtained oral clearing b/t trials. OM exam appeared grossly WFL; native dentition - missing few. Pt fed self holding cup to drink. He was talkative but Mod+ confusion was noted in his engagement.  Discussed pt's presenation and h/o dysphagia w/ MDs; order given for initiation of regular diet w/ aspiration precautions; strategies to support. Recommended strict REFLUX precautions; monitoring at all meals. ST services can be available for further education, assessment if indicated. Based on pt's h/o documented dysphagia, pt has an increased risk for Pulmonary decline d/t his dysphagia as well as d/t the Esophageal phase dysmotility and deficits -- ANY Regurgitation of REFLUX material can have risk to be aspirated. MDs/NSG made aware.  SLP Visit Diagnosis: Dysphagia, pharyngoesophageal phase (R13.14);Dysphagia, pharyngeal phase (R13.13)(ETOH abuse)    Aspiration Risk  Mild aspiration risk;Moderate aspiration risk;Risk for inadequate nutrition/hydration    Diet Recommendation  regular diet w/ mech soft meats(CUT and moistened); thin liquids. Aspiration and GERD precautions. Supervision during meals.  Medication Administration: Whole meds with puree(for safer swallowing)    Other  Recommendations Recommended Consults: Consider GI evaluation;Consider esophageal assessment(Dietician f/u; Palliative Care consult for [place]) Oral Care Recommendations: Oral care BID;Oral care before and after PO;Staff/trained caregiver to provide oral care   Follow up Recommendations None      Frequency and Duration  n/a         Prognosis Prognosis for Safe Diet Advancement:  Guarded Barriers to Reach Goals: Cognitive deficits;Time post onset;Severity of deficits(baseline dysphagia)      Swallow Study   General Date of Onset: 07/28/19 HPI: Pt is a 63 y.o. male patient who arrives at the ED w/ complaints of cough and chest pain for 2 weeks. Cough started first and then the chest pain afterwards. States the chest pain is constant the center of his chest; radiates down his left arm; from the bottom of his neck down into the sternum. It only occurs when he swallows liquids or solids. 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 On review of patient's prior records, he has a history of GI bleed with EGD performed at [facility] on 09/29/2018 finding SEVERE ulcerating Esophagitis of the ENTIRE mid and distal esophagus. Pt has had multiple swallow studies(MBSSs) at this facility and [facility] demonstrating Silent laryngeal penetration and aspiration were noted w/ liquids; food residue on a previous study w/ recommendations most recently for a dysphagia diet w/ Thickened liquids -- unsure if pt has been following this recommended diet to reduce risk for aspiration. Pt had a recent EGD in 05/2019 revealing: Diffuse severe mucosal changes characterized by sloughing and ulceration were found in the middle third of the esophagus and in the lower third of the esophagus. Suspect pt's h/o Head/Neck Ca, ongoing ETOH abuse, and SEVERE Esophagitis episodes per EGD contribute to his decreased pharyngeal  sensation, delayed pharyngeal swallow initiation, and increased risk for larynageal penetration and aspiration. In the past, pt has been noncompliant w/ the recommended dysphagia diets - he requests thin liquids to drink. Pt awake today, verbally conversive. Pt has had baseline  Cognitive decline in past admissions - unsure of his baseline. Type of Study: Bedside Swallow Evaluation Previous Swallow Assessment: multiple MBSSs and assessment at this facility between June-August 2020 Diet Prior to this Study: NPO Temperature Spikes Noted: No(wbc elevated) Respiratory Status: Nasal cannula(4 liters) History of Recent Intubation: No Behavior/Cognition: Alert;Cooperative;Pleasant mood;Confused;Distractible;Requires cueing(pt has had baseline Cognitive decline in past) Oral Cavity Assessment: Within Functional Limits Oral Care Completed by SLP: Yes Oral Cavity - Dentition: Adequate natural dentition;Missing dentition(several) Vision: Functional for self-feeding(appeared fair) Self-Feeding Abilities: Total assist Patient Positioning: Upright in bed(needed support upright) Baseline Vocal Quality: Low vocal intensity(appropriate) Volitional Cough: Weak(-Fair; reduced follow through at times) Volitional Swallow: Able to elicit    Oral/Motor/Sensory Function Overall Oral Motor/Sensory Function: Within functional limits(grossly)   Ice Chips Ice chips: Within functional limits Presentation: Spoon(fed; 4 trials)   Thin Liquid Thin Liquid: Impaired(at baseline per MBSS) Presentation: Self Fed;Cup;Straw(4 trials via each) Oral Phase Impairments: (adequate) Oral Phase Functional Implications: (adequate) Pharyngeal  Phase Impairments: Suspected delayed Swallow;Throat Clearing - Delayed(no overt clinical s/s - no decline in respiratory status) Other Comments: pt was using a throat clear/cough as a strategy intermittently as taught by SLP to aid clearing of any potential laryngeal penetration/aspiration    Nectar Thick Nectar Thick Liquid: Not tested   Honey Thick Honey Thick Liquid: Not tested   Puree Puree: Within functional limits Presentation: Spoon(fed; 10 trials)   Solid     Solid: Within functional limits(mech soft food trials - grossly WFL) Presentation: Spoon(fed; 5  trials)        Orinda Kenner, MS, CCC-SLP , 07/31/2019,6:09 PM

## 2019-07-31 NOTE — Consult Note (Signed)
Name: George Wade. MRN: [id number] DOB: April 05, 1956    ADMISSION DATE:  07/28/2019 CONSULTATION DATE: 07/31/2019  REFERRING MD : Dr. Marcille Blanco  CHIEF COMPLAINT: Shortness of Breath   BRIEF PATIENT DESCRIPTION:  63 yo male admitted to the medsurg unit with pulmonary embolism hospital course complicated by ETOH withdrawal requiring transfer to the stepdown unit on 10/20 with acute hypoxic respiratory failure following aspiration requiring HFNC  SIGNIFICANT EVENTS/STUDIES:  10/17: CTA Chest revealed motion degraded study but positive for acute pulmonary emboli in the right lung. No associated pleural effusion or abnormal pulmonary opacity. Persistent abnormal thickening of the esophagus, stable since August. This is nonspecific but esophagitis seems more likely than esophageal neoplasm now. Stable to slightly decreased mediastinal lymph node size since August. Calcified coronary artery atherosclerosis. Severe hepatic steatosis. 10/17: Pt admitted to the medsurg unit with pulmonary embolism  10/20: Pt transferred to the stepdown unit with worsening acute respiratory failure secondary to aspiration in the setting of ETOH withdrawal   HISTORY OF PRESENT ILLNESS:   This is a 63 yo male with a PMH of Tonsillar Cancer, ETOH Abuse, Rectal Bleeding, Lymphoma, HTN, Hypercholesteremia, Pneumonia, and Fatigue. He presented to [facility] ER on 10/17 via EMS with c/o cough and chest pain onset of symptoms 2 weeks prior to presentation.  Per ER notes upon arrival to the ER pt in severe respiratory distress and unable to speak in complete sentences.  CTA Chest revealed acute pulmonary emboli in the right lung and thickening of the esophagus consistent with esophagitis. EKG findings were sinus tachycardia with hr 116 and no evidence of ischemia.  Lab results revealed K+ 3.2, glucose 140, anion gap 16, alk phos 201, AST 114, ALT 75, hgb 9.2, and d-dimer 1,400.27.  CXR and COVID-19 negative. Therefore, heparin gtt  initiated and pt admitted to the [facility] unit for additional workup and treatment.  Hospitalization complicated by ETOH withdrawal symptoms and on 10/20 pt developed worsening acute hypoxic respiratory failure suspected secondary to aspiration requiring transfer to the stepdown unit for HFNC.     PAST MEDICAL HISTORY :   has a past medical history of Alcoholism with psychosis with complication, with hallucinations ([place]), ED (erectile dysfunction), Fatigue, History of pneumonia, Hypercholesteremia, Hypertension, Lymphoma ([place]) (2008), Rectal bleeding, Substance abuse ([place]), and 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([place]).  has a past surgical history that includes Replacement total knee bilateral (2009); Tonsillectomy (2008); Hernia repair (1958); Joint replacement; and Esophagogastroduodenoscopy (N/A, 05/18/2019). Prior to Admission medications   Medication Sig Start Date End Date Taking? Authorizing Provider  Rivaroxaban (XARELTO) 15 MG TABS tablet Take 15 mg by mouth 2 (two) times daily with a meal.   Yes [provider]  rivaroxaban (XARELTO) 20 MG TABS tablet Take 20 mg by mouth daily. 07/23/19 07/22/20 Yes [provider]   Allergies  Allergen Reactions  . Sulfa Antibiotics Nausea Only    FAMILY HISTORY:  family history includes Cancer in his mother and paternal grandmother; Hypertension in his father. SOCIAL HISTORY:  reports that he has never smoked. He has never used smokeless tobacco. He reports current alcohol use of about 5.0 standard drinks of alcohol per week. He reports that he does not use drugs.  REVIEW OF SYSTEMS: Positives in BOLD  Constitutional: Negative for fever, chills, weight loss, malaise/fatigue and diaphoresis.  HENT: Negative for hearing loss, ear pain, nosebleeds, congestion, sore throat, neck pain, tinnitus and ear discharge.   Eyes: Negative for blurred vision, double vision, photophobia, pain, discharge and redness.  Respiratory:  cough, hemoptysis, sputum  production, shortness of breath, wheezing and stridor.   Cardiovascular: chest pain, palpitations, orthopnea, claudication, leg swelling and PND.  Gastrointestinal: Negative for heartburn, nausea, vomiting, abdominal pain, diarrhea, constipation, blood in stool and melena.  Genitourinary: Negative for dysuria, urgency, frequency, hematuria and flank pain.  Musculoskeletal: Negative for myalgias, back pain, joint pain and falls.  Skin: Negative for itching and rash.  Neurological: Negative for dizziness, tingling, tremors, sensory change, speech change, focal weakness, seizures, loss of consciousness, weakness and headaches.  Endo/Heme/Allergies: Negative for environmental allergies and polydipsia. Does not bruise/bleed easily.  SUBJECTIVE:  No complaints at this time   VITAL SIGNS: Temp:  [98 F (36.7 C)-98.6 F (37 C)] 98 F (36.7 C) (10/20 0447) Pulse Rate:  [87-118] 99 (10/20 0535) Resp:  [16-40] 40 (10/20 0447) BP: (92-121)/(65-83) 92/65 (10/20 0447) SpO2:  [82 %-94 %] 93 % (10/20 0535) FiO2 (%):  [100 %] 100 % (10/20 0450)  PHYSICAL EXAMINATION: General: acutely ill appearing male resting in bed, NAD on NRB  Neuro: alert intermittently disoriented to situation, follows commands, PERRLA HEENT: supple, no JVD  Cardiovascular: nsr, rrr, no R/G  Lungs: diminished throughout right lobes, clear throughout left lobes, even, non labored  Abdomen: +BS x4, soft, non distended, non tender  Musculoskeletal: normal bulk and tone, moves all extremities  Skin: anal warts present, no pressure ulcers   Recent Labs  Lab 07/29/19 0730 07/29/19 1808 07/30/19 0425 07/31/19 0352  NA 142  --  140 140  K 2.8* 3.2* 3.7 4.1  CL 103  --  109 111  CO2 25  --  28 25  BUN 7*  --  6* 8  CREATININE 0.69  --  0.57* 0.67  GLUCOSE 107*  --  94 119*   Recent Labs  Lab 07/29/19 0434 07/30/19 0425 07/31/19 0352  HGB 7.9* 8.4* 8.7*  HCT 27.6* 30.8* 30.9*  WBC 8.6 7.5 23.2*  PLT 245 234 332    Dg Abd 1 View  Result Date: 07/29/2019 CLINICAL DATA:  Nausea and vomiting EXAM: ABDOMEN - 1 VIEW COMPARISON:  April 01, 2019 FINDINGS: There is a paucity of bowel gas limiting evaluation. Within this limitation, there is no evidence of bowel obstruction. No renal or ureteral stones noted. No free air, portal venous gas, or pneumatosis. IMPRESSION: No abnormalities identified. Electronically Signed   By: Dorise Bullion III M.D   On: 07/29/2019 15:50   Dg Chest Port 1 View  Result Date: 07/30/2019 CLINICAL DATA:  Cough EXAM: PORTABLE CHEST 1 VIEW COMPARISON:  07/28/2019 FINDINGS: Mild cardiomegaly. Both lungs are clear. The visualized skeletal structures are unremarkable. IMPRESSION: Mild cardiomegaly without acute abnormality of the lungs in AP portable projection. Electronically Signed   By: Eddie Candle M.D.   On: 07/30/2019 21:50    ASSESSMENT / PLAN:  Acute hypoxic respiratory failure secondary to pulmonary embolism of right lung and aspiration pneumonia  Supplemental O2 for dyspnea and/or hypoxia  Prn bronchodilator therapy Will start unasyn Trend WBC and monitor fever curve   Sinus tachycardia  Continuous telemetry monitoring  Prn metoprolol for hr control   Anemia without obvious acute blood loss  Continue eliquis  Trend CBC  Monitor for s/sx of bleeding and transfuse for hgb <7  Elevated liver enzymes secondary to ETOH abuse  Trend hepatic panel   Esophagitis per CTA Chest 10/17 SUP px: iv protonix Aspiration precautions  Will restart full liquid diet  Prn phenergan and zofran for nausea/vomiting   Acute  encephalopathy secondary to ETOH withdrawal symptoms Continue CIWA protocol  Once mentation improves will need ETOH abuse cessation counseling  Psychiatry consulted appreciate input  Tele-sitter (pt impulsive)   -Pt currently homeless: Education officer, museum and care management consulted   Marda Stalker, [place] Pager [phone] (please enter 7  digits) PCCM Consult Pager [phone] (please enter 7 digits)

## 2019-08-01 LAB — BASIC METABOLIC PANEL
Anion gap: 9 (ref 5–15)
BUN: 13 mg/dL (ref 8–23)
CO2: 23 mmol/L (ref 22–32)
Calcium: 8.5 mg/dL — ABNORMAL LOW (ref 8.9–10.3)
Chloride: 109 mmol/L (ref 98–111)
Creatinine, Ser: 0.67 mg/dL (ref 0.61–1.24)
GFR calc Af Amer: >60 mL/min (ref >60)
GFR calc non Af Amer: >60 mL/min (ref >60)
Glucose, Bld: 181 mg/dL — ABNORMAL HIGH (ref 70–99)
Potassium: 3.3 mmol/L — ABNORMAL LOW (ref 3.5–5.1)
Sodium: 141 mmol/L (ref 135–145)

## 2019-08-01 LAB — CBC
HCT: 28.2 % — ABNORMAL LOW (ref 39.0–52.0)
Hemoglobin: 7.9 g/dL — ABNORMAL LOW (ref 13.0–17.0)
MCH: 23 pg — ABNORMAL LOW (ref 26.0–34.0)
MCHC: 28 g/dL — ABNORMAL LOW (ref 30.0–36.0)
MCV: 82 fL (ref 80.0–100.0)
Platelets: 315 10*3/uL (ref 150–400)
RBC: 3.44 MIL/uL — ABNORMAL LOW (ref 4.22–5.81)
RDW: 24.8 % — ABNORMAL HIGH (ref 11.5–15.5)
WBC: 11.2 10*3/uL — ABNORMAL HIGH (ref 4.0–10.5)
nRBC: 0 % (ref 0.0–0.2)

## 2019-08-01 LAB — MAGNESIUM: Magnesium: 2.1 mg/dL (ref 1.7–2.4)

## 2019-08-01 LAB — PHOSPHORUS: Phosphorus: 3.1 mg/dL (ref 2.5–4.6)

## 2019-08-01 MED ORDER — DEXMEDETOMIDINE HCL IN NACL 400 MCG/100ML IV SOLN
0.4000 ug/kg/h | INTRAVENOUS | Status: DC
  Administered 2019-08-01: 13:00:00 0.8 ug/kg/h via INTRAVENOUS
  Administered 2019-08-01 (×2): 0.6 ug/kg/h via INTRAVENOUS
  Administered 2019-08-02: 13:00:00 0.8 ug/kg/h via INTRAVENOUS
  Administered 2019-08-02: 19:00:00 0.9 ug/kg/h via INTRAVENOUS
  Administered 2019-08-02: 0.8 ug/kg/h via INTRAVENOUS
  Administered 2019-08-03 (×3): 1.2 ug/kg/h via INTRAVENOUS
  Administered 2019-08-03: 23:00:00 1 ug/kg/h via INTRAVENOUS
  Administered 2019-08-03: 1.2 ug/kg/h via INTRAVENOUS
  Administered 2019-08-03: 01:00:00 1 ug/kg/h via INTRAVENOUS
  Administered 2019-08-04: 04:00:00 0.8 ug/kg/h via INTRAVENOUS
  Administered 2019-08-05: 02:00:00 0.4 ug/kg/h via INTRAVENOUS
  Administered 2019-08-05 – 2019-08-06 (×2): 1 ug/kg/h via INTRAVENOUS
  Administered 2019-08-06: 23:00:00 0.8 ug/kg/h via INTRAVENOUS
  Administered 2019-08-06: 12:00:00 1 ug/kg/h via INTRAVENOUS
  Administered 2019-08-07: 0.8 ug/kg/h via INTRAVENOUS
  Filled 2019-08-01 (×21): qty 100

## 2019-08-01 MED ORDER — POTASSIUM CHLORIDE 10 MEQ/100ML IV SOLN
10.0000 meq | INTRAVENOUS | Status: AC
  Administered 2019-08-01 (×3): 10 meq via INTRAVENOUS
  Filled 2019-08-01 (×3): qty 100

## 2019-08-01 MED ORDER — ENOXAPARIN SODIUM 80 MG/0.8ML ~~LOC~~ SOLN
75.0000 mg | Freq: Two times a day (BID) | SUBCUTANEOUS | Status: DC
  Administered 2019-08-01 – 2019-08-03 (×6): 75 mg via SUBCUTANEOUS
  Filled 2019-08-01 (×6): qty 0.8

## 2019-08-01 MED ORDER — POTASSIUM CHLORIDE 10 MEQ/100ML IV SOLN
10.0000 meq | INTRAVENOUS | Status: AC
  Administered 2019-08-01 (×3): 10 meq via INTRAVENOUS
  Filled 2019-08-01 (×3): qty 100

## 2019-08-01 NOTE — Progress Notes (Signed)
Pharmacy Electrolyte Monitoring Consult:  63 y.o. male admitted on 07/28/2019 with Cough and Chest Pain related to an acute PE.  Because of his alcohol use disorder he remains at a high risk for refeeding syndrome.    Labs:  Sodium (mmol/L)  Date Value  08/01/2019 141   Potassium (mmol/L)  Date Value  08/01/2019 3.3 (L)   Magnesium (mg/dL)  Date Value  08/01/2019 2.1   Phosphorus (mg/dL)  Date Value  08/01/2019 3.1   Calcium (mg/dL)  Date Value  08/01/2019 8.5 (L)   Albumin (g/dL)  Date Value  07/31/2019 2.7 (L)   Corrected Ca: 9.54 mg/dL  Plan: -Patient received potassium 10 mEq IV x2 (as documented on MAR) -Will order potassium 10 mEq IV x3 for a total of 50 mEq -Recheck electrolytes tomorrow with am labs  Pharmacy will continue to follow.  Sallye Lat, PharmD Candidate 08/01/2019 11:43 AM

## 2019-08-01 NOTE — Progress Notes (Addendum)
CRITICAL CARE NOTE  CC  Follow up resp failure  SUBJECTIVE Acute aspiration of gastric contents severe vomiting Severe hypoxia +rhonchi High risk for intubation  FiO2 (%):  [45 %-95 %] 45 %  BP (!) 89/74   Pulse 85   Temp 98.4 F (36.9 C) (Oral)   Resp (!) 25   Ht 5\' 8"  (1.727 m)   Wt 73.8 kg   SpO2 98%   BMI 24.75 kg/m    I/O last 3 completed shifts: In: 67 [P.O.:120; I.V.:3] Out: 600 [Urine:600] No intake/output data recorded.  SpO2: 98 % O2 Flow Rate (L/min): 35 L/min FiO2 (%): 45 %   SIGNIFICANT EVENTS  Review of Systems:  Gen:  Denies  fever, sweats, chills weight loss  HEENT: Denies blurred vision, double vision, ear pain, eye pain, hearing loss, nose bleeds, sore throat Cardiac:  No dizziness, chest pain or heaviness, chest tightness,edema, No JVD Resp:   No cough, -sputum production, -shortness of breath,+wheezing, -hemoptysis,  Other:  All other systems negative  PHYSICAL EXAMINATION:  GENERAL:critically ill appearing, +resp distress HEAD: Normocephalic, atraumatic.  EYES: Pupils equal, round, reactive to light.  No scleral icterus.  MOUTH: Moist mucosal membrane. NECK: Supple. No thyromegaly. No nodules. No JVD.  PULMONARY: +rhonchi, +wheezing CARDIOVASCULAR: S1 and S2. Regular rate and rhythm. No murmurs, rubs, or gallops.  GASTROINTESTINAL: Soft, nontender, -distended. No masses. Positive bowel sounds. No hepatosplenomegaly.  MUSCULOSKELETAL: No swelling, clubbing, or edema.  NEUROLOGIC:anxious SKIN:intact,warm,dry        CULTURE RESULTS   Recent Results (from the past 240 hour(s))  SARS CORONAVIRUS 2 (TAT 6-24 HRS) Nasopharyngeal Nasopharyngeal Swab     Status: None   Collection Time: 07/28/19  1:19 PM   Specimen: Nasopharyngeal Swab  Result Value Ref Range Status   SARS Coronavirus 2 NEGATIVE NEGATIVE Final    Comment: (NOTE) SARS-CoV-2 target nucleic acids are NOT DETECTED. The SARS-CoV-2 RNA is generally detectable in upper  and lower respiratory specimens during the acute phase of infection. Negative results do not preclude SARS-CoV-2 infection, do not rule out co-infections with other pathogens, and should not be used as the sole basis for treatment or other patient management decisions. Negative results must be combined with clinical observations, patient history, and epidemiological information. The expected result is Negative. Fact Sheet for Patients: SugarRoll.be Fact Sheet for Healthcare Providers: https://www.woods-mathews.com/ This test is not yet approved or cleared by the [place] FDA and  has been authorized for detection and/or diagnosis of SARS-CoV-2 by FDA under an Emergency Use Authorization (EUA). This EUA will remain  in effect (meaning this test can be used) for the duration of the COVID-19 declaration under Section 56 4(b)(1) of the Act, 21 U.S.C. section 360bbb-3(b)(1), unless the authorization is terminated or revoked sooner. Performed at [facility], [place] [address] [place], [place] [address]   MRSA PCR Screening     Status: None   Collection Time: 07/31/19  6:20 AM   Specimen: Nasopharyngeal  Result Value Ref Range Status   MRSA by PCR NEGATIVE NEGATIVE Final    Comment:        The GeneXpert MRSA Assay (FDA approved for NASAL specimens only), is one component of a comprehensive MRSA colonization surveillance program. It is not intended to diagnose MRSA infection nor to guide or monitor treatment for MRSA infections. Performed at [facility], [address], [place], [place] [address]           IMAGING    Dg Chest  Port 1 View  Result Date: 07/31/2019 CLINICAL DATA:  Recent aspiration EXAM: PORTABLE CHEST 1 VIEW COMPARISON:  07/30/2019 FINDINGS: Cardiac shadow is stable. Aortic calcifications are again seen. Increased basilar density is noted bilaterally consistent with the given clinical history. No  bony abnormality is seen. IMPRESSION: Bibasilar density consistent with the given clinical history of aspiration. Electronically Signed   By: Inez Catalina M.D.   On: 07/31/2019 13:21       ASSESSMENT AND PLAN SYNOPSIS  Severe ACUTE Hypoxic and Hypercapnic Respiratory Failure Acute aspiration of gastric contents/aspiration pneumonia High risk for intubation dounebs and steroids Aggressive PT and suctioing   CARDIAC ICU monitoring   INFECTIOUS DISEASE -continue antibiotics as prescribed -follow up cultures   SEVERE ALCOHOL WITHDRAWAL -Therapy with Thiamine and MVI -CIWA Protocol -Precedex as needed -High risk for intubation  -high risk for aspiration    GI GI PROPHYLAXIS as indicated  NUTRITIONAL STATUS DIET-->NPO status Constipation protocol as indicated Situation is grim as patient has a h/o dysphagia and now with severe aspiration episodes Will likely need J tube for ongoing nutritional support  ELECTROLYTES -follow labs as needed -replace as needed -pharmacy consultation and following  PE On oral anticoagulation   TRANSFUSIONS AS NEEDED MONITOR FSBS ASSESS the need for LABS as needed   Critical Care Time devoted to patient care services described in this note is 32 minutes.   Overall, patient is critically ill, prognosis is guarded.   Corrin Parker, M.D.  [facility]  Medical Director [place] Director [facility] Cardio-Pulmonary Department

## 2019-08-01 NOTE — Progress Notes (Signed)
Pharmacy Antibiotic Note  George Wade. is a 63 y.o. male admitted on 07/28/2019 with aspiration pneumonia.  Pharmacy has been consulted for Unasyn dosing.  Plan: -Continue Unasyn 3g IV q6h, day 2 -Monitor renal function and s/sx of infection and adverse events  Height: 5\' 8"  (172.7 cm) Weight: 162 lb 12.8 oz (73.8 kg) IBW/kg (Calculated) : 68.4  Temp (24hrs), Avg:98.2 F (36.8 C), Min:97.7 F (36.5 C), Max:98.5 F (36.9 C)  Recent Labs  Lab 07/28/19 1040 07/29/19 0434 07/29/19 0730 07/30/19 0425 07/31/19 0352 08/01/19 0434  WBC 8.6 8.6  --  7.5 23.2* 11.2*  CREATININE 0.57*  --  0.69 0.57* 0.67 0.67    Estimated Creatinine Clearance: 92.6 mL/min (by C-G formula based on SCr of 0.67 mg/dL).    Allergies  Allergen Reactions  . Sulfa Antibiotics Nausea Only   Antimicrobials this admission: Unasyn 10/20 >>  Microbiology Results: 10/20 MRSA PCR: negative 10/17 SARS-CoV-2: negative  Thank you for allowing pharmacy to be a part of this patient's care.  Sallye Lat, PharmD Candidate 08/01/2019 11:47 AM

## 2019-08-01 NOTE — Progress Notes (Signed)
[facility] [place] NAME: George Wade    MR#:  [id number]  DATE OF BIRTH:  [birth year]  SUBJECTIVE:   Patient is sleepy this morning, but opens his eyes to voice.  He endorses epigastric abdominal pain.  Denies any shortness of breath.  No acute events overnight.  REVIEW OF SYSTEMS:    Review of Systems  Constitutional: Negative for fever, chills weight loss HENT: Negative for ear pain, nosebleeds, congestion, facial swelling, rhinorrhea, neck pain, neck stiffness and ear discharge.   Respiratory: Negative for cough, shortness of breath, wheezing  Cardiovascular: Negative for chest pain, palpitations and leg swelling.  Gastrointestinal: Negative for heartburn, +epigastric abdominal pain, +vomiting, no diarrhea or constipation, no melena Genitourinary: Negative for dysuria, urgency, frequency, hematuria Musculoskeletal: Negative for back pain or joint pain Neurological: Negative for dizziness, seizures, syncope, focal weakness,  numbness and headaches.  Hematological: Does not bruise/bleed easily.  Psychiatric/Behavioral: Negative for hallucinations, confusion, dysphoric mood  DRUG ALLERGIES:   Allergies  Allergen Reactions  . Sulfa Antibiotics Nausea Only    VITALS:  Blood pressure 114/84, pulse 61, temperature 98.6 F (37 C), temperature source Oral, resp. rate 14, height 5\' 8"  (1.727 m), weight 73.8 kg, SpO2 100 %.  PHYSICAL EXAMINATION:  Constitutional: Appears well-developed and well-nourished. No distress. HENT: Normocephalic, atraumatic, Oropharynx is clear and moist.  Eyes: Conjunctivae and EOM are normal. PERRLA, no scleral icterus.  Neck: Normal ROM. Neck supple. No JVD. No tracheal deviation. CVS: RRR, S1/S2 +, no murmurs, no gallops, no carotid bruit.  Pulmonary: + Rhonchi present throughout the entire right lung. +diminished breath sounds in the lung bases bilaterally. HFNC in place. Abdominal: Soft. BS +,  no  distension, +epigastric abdominal pain, no rebound or guarding.  Musculoskeletal: Normal range of motion. No edema and no tenderness.  Neuro: Alert. CN 2-12 grossly intact. No focal deficits. Skin: Skin is warm and dry. No rash noted. Psychiatric: Normal mood and affect.   LABORATORY PANEL:   CBC Recent Labs  Lab 08/01/19 0434  WBC 11.2*  HGB 7.9*  HCT 28.2*  PLT 315   ------------------------------------------------------------------------------------------------------------------  Chemistries  Recent Labs  Lab 07/30/19 0425  08/01/19 0434  NA 140   < > 141  K 3.7   < > 3.3*  CL 109   < > 109  CO2 28   < > 23  GLUCOSE 94   < > 181*  BUN 6*   < > 13  CREATININE 0.57*   < > 0.67  CALCIUM 8.3*   < > 8.5*  MG 2.2   < > 2.1  AST 46*  --   --   ALT 42  --   --   ALKPHOS 158*  --   --   BILITOT 1.4*  --   --    < > = values in this interval not displayed.   ------------------------------------------------------------------------------------------------------------------  Cardiac Enzymes No results for input(s): TROPONINI in the last 168 hours. ------------------------------------------------------------------------------------------------------------------  RADIOLOGY:  Dg Chest Port 1 View  Result Date: 07/31/2019 CLINICAL DATA:  Recent aspiration EXAM: PORTABLE CHEST 1 VIEW COMPARISON:  07/30/2019 FINDINGS: Cardiac shadow is stable. Aortic calcifications are again seen. Increased basilar density is noted bilaterally consistent with the given clinical history. No bony abnormality is seen. IMPRESSION: Bibasilar density consistent with the given clinical history of aspiration. Electronically Signed   By: Inez Catalina M.D.   On: 07/31/2019 13:21   Dg Chest [facility]  Result Date: 07/30/2019 CLINICAL DATA:  Cough EXAM: PORTABLE CHEST 1 VIEW COMPARISON:  07/28/2019 FINDINGS: Mild cardiomegaly. Both lungs are clear. The visualized skeletal structures are unremarkable.  IMPRESSION: Mild cardiomegaly without acute abnormality of the lungs in AP portable projection. Electronically Signed   By: Eddie Candle M.D.   On: 07/30/2019 21:50     ASSESSMENT AND PLAN:   Severe acute hypxoic and hypercapnic respiratory failure due to aspiration pneumonitis- remains on HFNC today. -Continue unasyn -Wean O2 as able -Continue duonebs -SLP consult- recommended regular diet with aspiration and reflux precautions  Small right lung pulmonary emboli- seen on CT scan -On full dose lovenox -PT eval pending   Leukocytosis- due to steroids and aspiration pneumonia. WBC improving. -Trend WBC  Chronic iron deficiency anemia- hemoglobin is lower than baseline. -s/p IV iron x1 on 10/18 -Continue to monitor  EtOH abuse/withdrawal -Continue CIWA protocol -Precedex prn  Elevated LFTs- likely due to alcohol use.  AST/ALT improved. -Right upper quadrant ultrasound showed hepatic steatosis -Monitor  Tobacco use -Nicotine patch ordered  Management plans discussed with the patient and he is in agreement.  CODE STATUS: full  TOTAL TIME TAKING CARE OF THIS PATIENT: 36 minutes.   POSSIBLE D/C unknown, DEPENDING ON CLINICAL CONDITION.   [name]  M.D on 08/01/2019 at 3:41 PM  Between 7am to 6pm - Pager [phone]  After 6pm go to [email or web address] - password [email or web address] [facility]  Office  [phone]  CC: Primary care physician; Patient, [name] Per  Note: This dictation was prepared with Dragon dictation along with smaller phrase technology. Any transcriptional errors that result from this process are unintentional.

## 2019-08-02 ENCOUNTER — Inpatient Hospital Stay: Payer: Self-pay | Admitting: Anesthesiology

## 2019-08-02 ENCOUNTER — Encounter: Payer: Self-pay | Admitting: Anesthesiology

## 2019-08-02 ENCOUNTER — Encounter
Admission: EM | Disposition: A | Discharge status: Home / Self Care | Payer: Self-pay | Source: Home / Self Care | Attending: Internal Medicine

## 2019-08-02 DIAGNOSIS — K222 Esophageal obstruction: Secondary | ICD-10-CM

## 2019-08-02 DIAGNOSIS — R1310 Dysphagia, unspecified: Secondary | ICD-10-CM

## 2019-08-02 HISTORY — PX: ESOPHAGOGASTRODUODENOSCOPY (EGD) WITH PROPOFOL: SHX5813

## 2019-08-02 LAB — BASIC METABOLIC PANEL
Anion gap: 9 (ref 5–15)
BUN: 8 mg/dL (ref 8–23)
CO2: 24 mmol/L (ref 22–32)
Calcium: 8.6 mg/dL — ABNORMAL LOW (ref 8.9–10.3)
Chloride: 110 mmol/L (ref 98–111)
Creatinine, Ser: 0.54 mg/dL — ABNORMAL LOW (ref 0.61–1.24)
GFR calc Af Amer: >60 mL/min (ref >60)
GFR calc non Af Amer: >60 mL/min (ref >60)
Glucose, Bld: 129 mg/dL — ABNORMAL HIGH (ref 70–99)
Potassium: 3.9 mmol/L (ref 3.5–5.1)
Sodium: 143 mmol/L (ref 135–145)

## 2019-08-02 SURGERY — ESOPHAGOGASTRODUODENOSCOPY (EGD) WITH PROPOFOL
Anesthesia: General

## 2019-08-02 MED ORDER — PROPOFOL 10 MG/ML IV BOLUS
INTRAVENOUS | Status: DC | PRN
  Administered 2019-08-02: 70 mg via INTRAVENOUS

## 2019-08-02 MED ORDER — PROPOFOL 500 MG/50ML IV EMUL
INTRAVENOUS | Status: DC | PRN
  Administered 2019-08-02: 50 ug/kg/min via INTRAVENOUS

## 2019-08-02 MED ORDER — FENTANYL CITRATE (PF) 100 MCG/2ML IJ SOLN
INTRAMUSCULAR | Status: AC
  Filled 2019-08-02: qty 2

## 2019-08-02 NOTE — Progress Notes (Signed)
Report given to Katharine Look, Pt transported back to ICU 3 via CRNA and Tech.

## 2019-08-02 NOTE — Progress Notes (Signed)
Despite the patient answering questions appropriately he is lethargic and has recurrent aspiration every time he is fed.  It was thought by myself and the ICU staff that his procedure needs to be done emergently.  I did discuss it with the patient and he did state that he understood although he was very lethargic.  The procedure will be done for an urgent need.

## 2019-08-02 NOTE — Anesthesia Preprocedure Evaluation (Addendum)
Anesthesia Evaluation  Patient identified by MRN, date of birth, ID band Patient awake    Reviewed: Allergy & Precautions, NPO status , Patient's Chart, lab work & pertinent test results, reviewed documented beta blocker date and time   Airway Mallampati: III  TM Distance: >3 FB     Dental  (+) Chipped   Pulmonary           Cardiovascular hypertension, Pt. on medications      Neuro/Psych PSYCHIATRIC DISORDERS    GI/Hepatic   Endo/Other    Renal/GU      Musculoskeletal   Abdominal   Peds  Hematology   Anesthesia Other Findings ETOH. ARF hx.  Lymphoma. Tonsillar ca. Drug abuse. Aspiration. Smokes Hx PE. EKG checked and ok. Last EF was 65.  Reproductive/Obstetrics                             Anesthesia Physical Anesthesia Plan  ASA: III  Anesthesia Plan: General   Post-op Pain Management:    Induction: Intravenous  PONV Risk Score and Plan:   Airway Management Planned:   Additional Equipment:   Intra-op Plan:   Post-operative Plan:   Informed Consent: I have reviewed the patients History and Physical, chart, labs and discussed the procedure including the risks, benefits and alternatives for the proposed anesthesia with the patient or authorized representative who has indicated his/her understanding and acceptance.       Plan Discussed with: CRNA  Anesthesia Plan Comments:         Anesthesia Quick Evaluation

## 2019-08-02 NOTE — TOC Progression Note (Signed)
Transition of Care (TOC) - Progression Note    Patient Details  Name: George Wade. MRN: [id number] Date of Birth: 1955-11-25  Transition of Care [facility]) CM/SW [place], [place] Phone Number: 08/02/2019, 4:38 PM  Clinical Narrative:  CSW was able to find a sister named Chong Sicilian in previous chart. Her number is [phone]. CSW spoke with Patty. Chong Sicilian states that patient is estranged from all of his family. Sister states that patient is divorced and has no one that is legally responsible for him. CSW explained that MD needs consents for medical procedures. Sister states that she would provide consents if needed. CSW gave patient's RN the sister's number and information. CSW will continue to follow.           Expected Discharge Plan and Services                                                 Social Determinants of Health (SDOH) Interventions    Readmission Risk Interventions No flowsheet data found.

## 2019-08-02 NOTE — Progress Notes (Signed)
CRITICAL CARE NOTE  CC  Follow up resp failure  SUBJECTIVE Severe DT's Acute aspiration of gastric contents +rhonchi On precedex High risk for intubation  FiO2 (%):  [35 %] 35 %  BP (!) 139/97   Pulse (!) 57   Temp 98.7 F (37.1 C) (Oral)   Resp 18   Ht 5\' 8"  (1.727 m)   Wt 73.8 kg   SpO2 98%   BMI 24.75 kg/m    I/O last 3 completed shifts: In: 1453.9 [I.V.:264.9; IV Piggyback:1189] Out: 2050 [Urine:2050] No intake/output data recorded.  SpO2: 98 % O2 Flow Rate (L/min): 1.5 L/min FiO2 (%): 35 %   SIGNIFICANT EVENTS 10/19 acute aspiration, severe resp failure on high flow [place] 10/20 remains on high flow [place]  10/21 precedex infusion for DT's  REVIEW OF SYSTEMS  PATIENT IS UNABLE TO PROVIDE COMPLETE REVIEW OF SYSTEM S DUE TO SEVERE CRITICAL ILLNESS AND ENCEPHALOPATHY   PHYSICAL EXAMINATION:  GENERAL:critically ill appearing, +resp distress HEAD: Normocephalic, atraumatic.  EYES: Pupils equal, round, reactive to light.  No scleral icterus.  MOUTH: Moist mucosal membrane. NECK: Supple. No thyromegaly. No nodules. No JVD.  PULMONARY: +rhonchi, +wheezing CARDIOVASCULAR: S1 and S2. Regular rate and rhythm. No murmurs, rubs, or gallops.  GASTROINTESTINAL: Soft, nontender, -distended. No masses. Positive bowel sounds. No hepatosplenomegaly.  MUSCULOSKELETAL: No swelling, clubbing, or edema.  NEUROLOGIC: obtunded SKIN:intact,warm,dry         CULTURE RESULTS   Recent Results (from the past 240 hour(s))  SARS CORONAVIRUS 2 (TAT 6-24 HRS) Nasopharyngeal Nasopharyngeal Swab     Status: None   Collection Time: 07/28/19  1:19 PM   Specimen: Nasopharyngeal Swab  Result Value Ref Range Status   SARS Coronavirus 2 NEGATIVE NEGATIVE Final    Comment: (NOTE) SARS-CoV-2 target nucleic acids are NOT DETECTED. The SARS-CoV-2 RNA is generally detectable in upper and lower respiratory specimens during the acute phase of infection. Negative results do not preclude  SARS-CoV-2 infection, do not rule out co-infections with other pathogens, and should not be used as the sole basis for treatment or other patient management decisions. Negative results must be combined with clinical observations, patient history, and epidemiological information. The expected result is Negative. Fact Sheet for Patients: SugarRoll.be Fact Sheet for Healthcare Providers: https://www.woods-mathews.com/ This test is not yet approved or cleared by the [place] FDA and  has been authorized for detection and/or diagnosis of SARS-CoV-2 by FDA under an Emergency Use Authorization (EUA). This EUA will remain  in effect (meaning this test can be used) for the duration of the COVID-19 declaration under Section 56 4(b)(1) of the Act, 21 U.S.C. section 360bbb-3(b)(1), unless the authorization is terminated or revoked sooner. Performed at [facility], [place] [address], [place], [place] [address]   MRSA PCR Screening     Status: None   Collection Time: 07/31/19  6:20 AM   Specimen: Nasopharyngeal  Result Value Ref Range Status   MRSA by PCR NEGATIVE NEGATIVE Final    Comment:        The GeneXpert MRSA Assay (FDA approved for NASAL specimens only), is one component of a comprehensive MRSA colonization surveillance program. It is not intended to diagnose MRSA infection nor to guide or monitor treatment for MRSA infections. Performed at [facility], [place] [address] [place], [place] [address]              ASSESSMENT AND PLAN SYNOPSIS   Severe ACUTE Hypoxic and Hypercapnic Respiratory Failure Acute aspiration of gastric  contents/aspiration pneumonia Severe DT's High risk for intubation Aggressive chest PT and suctioning  CARDIAC ICU monitoring   INFECTIOUS DISEASE -continue antibiotics as prescribed -follow up cultures  SEVERE ALCOHOL WITHDRAWAL -Therapy with Thiamine and MVI -CIWA  Protocol -Precedex as needed -High risk for intubation  -high risk for aspiration   GI GI PROPHYLAXIS as indicated Situation is grim as patient has a h/o dysphagia and now with severe aspiration episodes Will likely need PEG tube/J tube for ongoing nutritional support  ELECTROLYTES -follow labs as needed -replace as needed -pharmacy consultation and following    DVT/GI PRX ordered TRANSFUSIONS AS NEEDED MONITOR FSBS ASSESS the need for LABS as needed   PE On oral anticoagulation   Critical Care Time devoted to patient care services described in this note is 34 minutes.   Overall, patient is critically ill, prognosis is guarded.   high risk for cardiac arrest and death.    Corrin Parker, M.D.  [facility]  Medical Director [place] Director [facility] Cardio-Pulmonary Department

## 2019-08-02 NOTE — Progress Notes (Signed)
Pharmacy Antibiotic Note  Blu Lori. is a 63 y.o. male admitted on 07/28/2019 with aspiration pneumonia.  Pharmacy has been consulted for Unasyn dosing.  Plan: -Continue Unasyn 3g IV q6h, day 3 -Monitor renal function and s/sx of infection and adverse events  Height: 5\' 8"  (172.7 cm) Weight: 162 lb 12.8 oz (73.8 kg) IBW/kg (Calculated) : 68.4  Temp (24hrs), Avg:98.7 F (37.1 C), Min:98.6 F (37 C), Max:98.8 F (37.1 C)  Recent Labs  Lab 07/28/19 1040 07/29/19 0434 07/29/19 0730 07/30/19 0425 07/31/19 0352 08/01/19 0434 08/02/19 0521  WBC 8.6 8.6  --  7.5 23.2* 11.2*  --   CREATININE 0.57*  --  0.69 0.57* 0.67 0.67 0.54*    Estimated Creatinine Clearance: 92.6 mL/min (A) (by C-G formula based on SCr of 0.54 mg/dL (L)).    Allergies  Allergen Reactions  . Sulfa Antibiotics Nausea Only   Antimicrobials this admission: Unasyn 10/20 >>  Microbiology Results: 10/20 MRSA PCR: negative 10/17 SARS-CoV-2: negative  Thank you for allowing pharmacy to be a part of this patient's care.  Sallye Lat, PharmD Candidate 08/02/2019 11:08 AM

## 2019-08-02 NOTE — Progress Notes (Signed)
Patient arrived to endoscopy oriented X1. On 2L oxygen. Coughing intermittently with goopy/thick secretions that are tan colored. On dexmedetomidine drip. Unable to assess fully for pre-op questions. Vitals stable.

## 2019-08-02 NOTE — Progress Notes (Signed)
Patient back from egd

## 2019-08-02 NOTE — TOC Initial Note (Signed)
Transition of Care (TOC) - Initial/Assessment Note    Patient Details  Name: George Wade. MRN: [id number] Date of Birth: 03-24-1956  [facility]) CM/SW Contact:    Annamaria Boots, [name] Phone Number: 08/02/2019, 4:24 PM  Clinical Narrative:  Patient is at high risk for readmission. Patient is currently confused and unable to complete assessment. CSW completed chart review. Per chart and previous admissions, patient has no family that is supportive or involved at this time. Patient has 4 sons that are estranged. Patient also had a friend [name]) that is estranged. CSW notified by RN that patient needs consent signed for medical procedure but patient is not alert enough to sign. CSW found number for friend [name] [phone] and left voicemail requesting a call back. CSW was unable to find any other contact numbers at this time. Per chart review, patient was involved with DSS regarding guardianship in July 2020. CSW contacted DSS supervisor Elaina Hoops to get more information regarding guardianship. Barrie Lyme is unavailable so CSW left voicemail requesting call back. CSW will continue to try and find more family or guardian. CSW will continue to follow for discharge planning.                         Patient Goals and CMS Choice        Expected Discharge Plan and Services                                                Prior Living Arrangements/Services                       Activities of Daily Living Home Assistive Devices/Equipment: None ADL Screening (condition at time of admission) Patient's cognitive ability adequate to safely complete daily activities?: Yes Is the patient deaf or have difficulty hearing?: No Does the patient have difficulty seeing, even when wearing glasses/contacts?: No Does the patient have difficulty concentrating, remembering, or making decisions?: No Patient able to express need for assistance with ADLs?:  Yes Does the patient have difficulty dressing or bathing?: No Independently performs ADLs?: Yes (appropriate for developmental age) Does the patient have difficulty walking or climbing stairs?: No Weakness of Legs: None Weakness of Arms/Hands: None  Permission Sought/Granted                  Emotional Assessment              Admission diagnosis:  Pulmonary embolism without acute cor pulmonale, unspecified chronicity, unspecified pulmonary embolism type ([place]) [I26.99] Patient Active Problem List   Diagnosis Date Noted  . Problems with swallowing and mastication   . Stricture and stenosis of esophagus   . Malnutrition of moderate degree 07/31/2019  . Nausea & vomiting   . Pulmonary emboli ([place]) 07/28/2019  . 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 Alcohol dependence with uncomplicated intoxication ([place])   . Acute respiratory failure with hypoxia ([place]) 05/15/2019  . Wernicke's encephalopathy 04/15/2019  . GI bleed 03/31/2019  . Alcohol withdrawal ([place]) 12/09/2017   PCP:  Patient, [name] Per Pharmacy:   [facility] [address], [place] [address] Phone: [phone] Fax: [phone]  [address], [place]  [place] [address] Phone: [phone] Fax: [phone]     Social Determinants of Health (SDOH) Interventions    Readmission Risk Interventions No flowsheet data found.

## 2019-08-02 NOTE — Anesthesia Post-op Follow-up Note (Signed)
Anesthesia QCDR form completed.        

## 2019-08-02 NOTE — Progress Notes (Signed)
[facility] [place] NAME: George Wade    MR#:  [id number]  DATE OF BIRTH:  1956/02/19  SUBJECTIVE:   Patient continues to be somnolent this morning.  His eyes to voice, but does not answer questions.  REVIEW OF SYSTEMS:    Unable to obtain due to altered mental status  DRUG ALLERGIES:   Allergies  Allergen Reactions  . Sulfa Antibiotics Nausea Only    VITALS:  Blood pressure 109/87, pulse 71, temperature (!) 96.9 F (36.1 C), temperature source Tympanic, resp. rate 17, height 5\' 8"  (1.727 m), weight 73.8 kg, SpO2 98 %.  PHYSICAL EXAMINATION:  Constitutional: Appears well-developed and well-nourished. No distress. HENT: Normocephalic, atraumatic, Oropharynx is clear and moist.  Eyes: Conjunctivae and EOM are normal. PERRLA, no scleral icterus.  Neck: Normal ROM. Neck supple. No JVD. No tracheal deviation. CVS: RRR, S1/S2 +, no murmurs, no gallops, no carotid bruit.  Pulmonary: + Rhonchi present throughout the entire right lung. +diminished breath sounds in the lung bases bilaterally. [place] in place. Abdominal: Soft. BS +,  no distension, +epigastric abdominal pain, no rebound or guarding.  Musculoskeletal: Normal range of motion. No edema and no tenderness.  Neuro: Alert. CN 2-12 grossly intact. No focal deficits. Skin: Skin is warm and dry. No rash noted. Psychiatric: Normal mood and affect.   LABORATORY PANEL:   CBC Recent Labs  Lab 08/01/19 0434  WBC 11.2*  HGB 7.9*  HCT 28.2*  PLT 315   ------------------------------------------------------------------------------------------------------------------  Chemistries  Recent Labs  Lab 07/30/19 0425  08/01/19 0434 08/02/19 0521  NA 140   < > 141 143  K 3.7   < > 3.3* 3.9  CL 109   < > 109 110  CO2 28   < > 23 24  GLUCOSE 94   < > 181* 129*  BUN 6*   < > 13 8  CREATININE 0.57*   < > 0.67 0.54*  CALCIUM 8.3*   < > 8.5* 8.6*  MG 2.2   < > 2.1  --   AST 46*  --   --    --   ALT 42  --   --   --   ALKPHOS 158*  --   --   --   BILITOT 1.4*  --   --   --    < > = values in this interval not displayed.   ------------------------------------------------------------------------------------------------------------------  Cardiac Enzymes No results for input(s): TROPONINI in the last 168 hours. ------------------------------------------------------------------------------------------------------------------  RADIOLOGY:  No results found.   ASSESSMENT AND PLAN:   Severe acute hypxoic and hypercapnic respiratory failure due to aspiration pneumonia- currently on 2 L O2 by nasal cannula. -Continue unasyn -Wean O2 as able -Continue duonebs  Dysphagia- having severe aspiration episodes -Patient will need PEG tube due to inability to maintain adequate nutrition -NPO  Small right lung pulmonary emboli- seen on CT scan -On full dose lovenox -PT eval pending   Chronic iron deficiency anemia- hemoglobin is lower than baseline. -s/p IV iron x1 on 10/18 -Continue to monitor  DTs with a history of EtOH abuse -Precedex gtt  Elevated LFTs- likely due to alcohol use.  AST/ALT improved. -Right upper quadrant ultrasound showed hepatic steatosis -Monitor  Tobacco use -Nicotine patch ordered  Management plans discussed with the patient and he is in agreement.  CODE STATUS: full  TOTAL TIME TAKING CARE OF THIS PATIENT: 36 minutes.   POSSIBLE D/C unknown, DEPENDING ON CLINICAL CONDITION.  [name] Mayo M.D on 08/02/2019 at 2:02 PM  Between 7am to 6pm - Pager [phone]  After 6pm go to [email or web address] - password [email or web address] [facility]  Office  [phone]  CC: Primary care physician; Patient, [name] Per  Note: This dictation was prepared with Dragon dictation along with smaller phrase technology. Any transcriptional errors that result from this process are unintentional.

## 2019-08-02 NOTE — Transfer of Care (Signed)
Immediate Anesthesia Transfer of Care Note  Patient: George Wade.  Procedure(s) Performed: ESOPHAGOGASTRODUODENOSCOPY (EGD) WITH PROPOFOL (N/A )  Patient Location: PACU  Anesthesia Type:General  Level of Consciousness: sedated  Airway & Oxygen Therapy: Patient Spontanous Breathing and Patient connected to nasal cannula oxygen  Post-op Assessment: Report given to RN and Post -op Vital signs reviewed and stable  Post vital signs: Reviewed and stable  Last Vitals:  Vitals Value Taken Time  BP 110/82 08/02/19 1600  Temp    Pulse 68 08/02/19 1600  Resp 21 08/02/19 1600  SpO2 94 % 08/02/19 1600    Last Pain:  Vitals:   08/02/19 1355  TempSrc: Tympanic  PainSc:          Complications: No apparent anesthesia complications

## 2019-08-02 NOTE — Progress Notes (Signed)
Patient tx to endoscopy.

## 2019-08-02 NOTE — Op Note (Addendum)
[facility] Gastroenterology Patient Name: George Wade Procedure Date: 08/02/2019 3:21 PM MRN: [id number] Account #: [id number] Date of Birth: [birth year] Admit Type: Inpatient Age: 63 Room: [facility] ENDO ROOM 4 Gender: Male Note Status: Finalized Procedure:            Upper GI endoscopy Indications:          Dysphagia Providers:            [name] MD, MD Referring MD:         No Local Md, MD (Referring MD) Medicines:            Propofol per Anesthesia Complications:        No immediate complications. Procedure:            Pre-Anesthesia Assessment:                       - Prior to the procedure, a History and Physical was                        performed, and patient medications and allergies were                        reviewed. The patient's tolerance of previous                        anesthesia was also reviewed. The risks and benefits of                        the procedure and the sedation options and risks were                        discussed with the patient. All questions were                        answered, and informed consent was obtained. Prior                        Anticoagulants: The patient has taken no previous                        anticoagulant or antiplatelet agents. ASA Grade                        Assessment: III - A patient with severe systemic                        disease. After reviewing the risks and benefits, the                        patient was deemed in satisfactory condition to undergo                        the procedure.                       After obtaining informed consent, the endoscope was                        passed under direct vision. Throughout the procedure,  the patient's blood pressure, pulse, and oxygen                        saturations were monitored continuously. The Endoscope                        was introduced through the mouth, and advanced to the                        middle  third of esophagus. The upper GI endoscopy was                        accomplished without difficulty. The patient tolerated                        the procedure well. Findings:      One benign-appearing, intrinsic severe (stenosis; an endoscope cannot       pass) stenosis was found in the lower third of the esophagus. The       stenosis was not traversed. A TTS dilator was passed through the scope.       Dilation with an 05-19-09 mm balloon dilator was performed to 9 mm. The       dilation site was examined following endoscope reinsertion and showed       mild mucosal disruption. Impression:           - Benign-appearing esophageal stenosis. Dilated.                       - Scope could not be passed beyons the stricture.                       - No specimens collected. Recommendation:       - Return patient to ICU for ongoing care.                       - NPO.                       - Refer to an interventional radiologist for a PEG. Procedure Code(s):    --- Professional ---                       [phone], Esophagoscopy, flexible, transoral; with                        transendoscopic balloon dilation (less than 30 mm                        diameter) Diagnosis Code(s):    --- Professional ---                       R13.10, Dysphagia, unspecified                       K22.2, Esophageal obstruction CPT copyright 2019 American Medical Association. All rights reserved. The codes documented in this report are preliminary and upon coder review may  be revised to meet current compliance requirements. [name] MD, MD 08/02/2019 3:50:10 PM This report has been signed electronically. Number of Addenda: 0 Note Initiated On: 08/02/2019 3:21 PM Estimated Blood Loss: Estimated blood loss: none.  [facility]

## 2019-08-02 NOTE — Progress Notes (Signed)
[place] for Apixaban Indication: pulmonary embolus  Allergies  Allergen Reactions  . Sulfa Antibiotics Nausea Only    Patient Measurements: Height: 5\' 8"  (172.7 cm) Weight: 162 lb 12.8 oz (73.8 kg) IBW/kg (Calculated) : 68.4   Vital Signs: Temp: 98.7 F (37.1 C) (10/22 0700) Temp Source: Oral (10/22 0200) BP: 128/87 (10/22 0900) Pulse Rate: 66 (10/22 0900)  Labs: Recent Labs    07/31/19 0352 08/01/19 0434 08/02/19 0521  HGB 8.7* 7.9*  --   HCT 30.9* 28.2*  --   PLT 332 315  --   CREATININE 0.67 0.67 0.54*    Estimated Creatinine Clearance: 92.6 mL/min (A) (by C-G formula based on SCr of 0.54 mg/dL (L)).   Assessment: 63 yo male currently on heparin drip for PE transitioning to apixaban  Goal of Therapy:  Monitor platelets by anticoagulation protocol: Yes   Plan:  Patient started on apixaban for PE. Patient unable to take PO meds. Apixaban has been switched to therapeutic Lovenox until patient able to tolerate PO meds.  Pharmacy will continue to follow.   Tawnya Crook, PharmD 08/02/2019,11:09 AM

## 2019-08-02 NOTE — Progress Notes (Signed)
Pharmacy Electrolyte Monitoring Consult:  63 y.o. male admitted on 07/28/2019 with Cough and Chest Pain related to an acute PE.  Because of his alcohol use disorder he remains at a high risk for refeeding syndrome.    Labs:  Sodium (mmol/L)  Date Value  08/02/2019 143   Potassium (mmol/L)  Date Value  08/02/2019 3.9   Magnesium (mg/dL)  Date Value  08/01/2019 2.1   Phosphorus (mg/dL)  Date Value  08/01/2019 3.1   Calcium (mg/dL)  Date Value  08/02/2019 8.6 (L)   Albumin (g/dL)  Date Value  07/31/2019 2.7 (L)   Corrected Ca: 9.64 mg/dL  Plan: -Patient received a total 50 mEq of potassium on 10/21, levels went from 3.3 >> 3.9 mmol/L. Will not replenished today.  -Recheck electrolytes tomorrow with am labs  Pharmacy will continue to follow.  Sallye Lat, PharmD Candidate 08/02/2019 11:08 AM

## 2019-08-03 DIAGNOSIS — F10231 Alcohol dependence with withdrawal delirium: Secondary | ICD-10-CM

## 2019-08-03 LAB — BASIC METABOLIC PANEL
Anion gap: 11 (ref 5–15)
BUN: 6 mg/dL — ABNORMAL LOW (ref 8–23)
CO2: 23 mmol/L (ref 22–32)
Calcium: 8.4 mg/dL — ABNORMAL LOW (ref 8.9–10.3)
Chloride: 107 mmol/L (ref 98–111)
Creatinine, Ser: 0.59 mg/dL — ABNORMAL LOW (ref 0.61–1.24)
GFR calc Af Amer: >60 mL/min (ref >60)
GFR calc non Af Amer: >60 mL/min (ref >60)
Glucose, Bld: 112 mg/dL — ABNORMAL HIGH (ref 70–99)
Potassium: 3.5 mmol/L (ref 3.5–5.1)
Sodium: 141 mmol/L (ref 135–145)

## 2019-08-03 LAB — CBC
HCT: 37.8 % — ABNORMAL LOW (ref 39.0–52.0)
Hemoglobin: 10.6 g/dL — ABNORMAL LOW (ref 13.0–17.0)
MCH: 23.1 pg — ABNORMAL LOW (ref 26.0–34.0)
MCHC: 28 g/dL — ABNORMAL LOW (ref 30.0–36.0)
MCV: 82.5 fL (ref 80.0–100.0)
Platelets: 312 10*3/uL (ref 150–400)
RBC: 4.58 MIL/uL (ref 4.22–5.81)
RDW: 26.5 % — ABNORMAL HIGH (ref 11.5–15.5)
WBC: 8.8 10*3/uL (ref 4.0–10.5)
nRBC: 0 % (ref 0.0–0.2)

## 2019-08-03 LAB — PROTIME-INR
INR: 1.2 (ref 0.8–1.2)
Prothrombin Time: 14.8 seconds (ref 11.4–15.2)

## 2019-08-03 LAB — GLUCOSE, CAPILLARY: Glucose-Capillary: 138 mg/dL — ABNORMAL HIGH (ref 70–99)

## 2019-08-03 MED ORDER — THIAMINE HCL 100 MG/ML IJ SOLN
Freq: Every day | INTRAVENOUS | Status: DC
  Administered 2019-08-03 – 2019-08-06 (×4): via INTRAVENOUS
  Filled 2019-08-03 (×7): qty 1000

## 2019-08-03 MED ORDER — FLUCONAZOLE IN SODIUM CHLORIDE 200-0.9 MG/100ML-% IV SOLN
200.0000 mg | INTRAVENOUS | Status: DC
  Administered 2019-08-03 – 2019-08-06 (×4): 200 mg via INTRAVENOUS
  Filled 2019-08-03 (×6): qty 100

## 2019-08-03 MED ORDER — LORAZEPAM 2 MG/ML IJ SOLN
2.0000 mg | INTRAMUSCULAR | Status: DC | PRN
  Administered 2019-08-03 – 2019-08-11 (×24): 2 mg via INTRAVENOUS
  Filled 2019-08-03 (×25): qty 1

## 2019-08-03 MED ORDER — POTASSIUM CHLORIDE 10 MEQ/100ML IV SOLN
10.0000 meq | INTRAVENOUS | Status: AC
  Administered 2019-08-03 (×4): 10 meq via INTRAVENOUS
  Filled 2019-08-03 (×4): qty 100

## 2019-08-03 NOTE — Progress Notes (Addendum)
Pharmacy Electrolyte Monitoring Consult:  63 y.o. male admitted on 07/28/2019 with Cough and Chest Pain related to an acute PE.  Because of his alcohol use disorder he remains at a high risk for refeeding syndrome.    Labs:  Sodium (mmol/L)  Date Value  08/03/2019 141   Potassium (mmol/L)  Date Value  08/03/2019 3.5   Magnesium (mg/dL)  Date Value  08/01/2019 2.1   Phosphorus (mg/dL)  Date Value  08/01/2019 3.1   Calcium (mg/dL)  Date Value  08/03/2019 8.4 (L)   Albumin (g/dL)  Date Value  07/31/2019 2.7 (L)   Corrected Ca: 9.44 mg/dL  Plan: -Patient's K levels went from 3.9>>3.5.  -Patient has aspirated several times. Patient is unable to take any PO meds.  -Plan on having PEG tube on Monday. Patient will remain NPO until then.  -Will replace electrolytes as indicated as patient is not currently able to received nutritional. -Will replenished with Potassium 10 mEq IV x4.  -Recheck electrolytes tomorrow with am labs  Pharmacy will continue to follow.  Sallye Lat, PharmD Candidate 08/03/2019 10:51 AM

## 2019-08-03 NOTE — Progress Notes (Signed)
   08/03/19 1500  Clinical Encounter Type  Visited With Patient;Health care provider  Visit Type Initial  Referral From Nurse  Spiritual Encounters  Spiritual Needs Emotional  Stress Factors  Patient Stress Factors Health changes  Ch was paged for emotional support. Pt wanted to talk about his life. He talked about his children, work, and different ups and downs of his life. As he was talking, he got drowsy so ch suggested that the pt get some rest and that the ch would return. Ch will check in half an hour later as was promised to the pt.

## 2019-08-03 NOTE — Progress Notes (Signed)
Nutrition Follow Up Note   DOCUMENTATION CODES:   Non-severe (moderate) malnutrition in context of social or environmental circumstances  INTERVENTION:   Once G-tube in place and ready for use, recommend:  Osmolite 1.5 @50ml /hr- Initiate at 95m/hr and increase by 11mhr q 8 hours until goal rate is reached.   Prostat liquid protein 30 ml daily via tube, each supplement provides 100 kcal, 15 grams protein.  Free water flushes 15035m4 hours    Regimen provides 1900kcal/day, 90g/day protein, [id number] water   Folic acid and thiamine daily in setting of etoh abuse   Pt at high refeed risk; recommend monitor K, Mg and P las daily   Bowel regimen as needed per MD  NUTRITION DIAGNOSIS:   Moderate Malnutrition related to social / environmental circumstances(EtOH abuse, limited access to well-balanced meals) as evidenced by mild-moderate fat depletion, mild-moderate muscle depletion.  GOAL:   Patient will meet greater than or equal to 90% of their needs  -not met  MONITOR:   Labs, Weight trends, TF tolerance, Skin, I & O's  ASSESSMENT:   [age]ar old male with PMHx of HTN, hx non-Hodgkin's lymphoma, hx tonsillar cancer, EtOH abuse admitted with small right lung pulmonary emboli, chronic iron deficiency anemia, dysphagia.   Pt s/p EGD 10/23 found to have benign appearing esophageal stenosis which was dilated. Scope could not be passed beyond stricture; GI recommending IR G-tube   Pt unable to advance diet r/t dysphagia and aspirating. Plan is for IR G-tube placement on Monday (10/26). Pt has been NPO since 10/20 and was only eating sips and bites prior to NPO. Recommend TPN in the interim as it will be 4-5 days before patient would be able to use his tube for feeds; MD would like to hold off on TPN for now. Pt is at high refeed risk. No new weight since admit; will request weekly weights. Last BM documented 10/19; recommend bowel regimen as needed per MD.   Medications  reviewed and include: lovenox, nicotine, protonix, unasyn, precedex, KCl, NaCl w/ MVI, folic acid & thiamine  Labs reviewed: BUN 6(L), creat 0.59(L) Hgb 10.6(L), Hct 37.8(L)  Diet Order:   Diet Order            Diet NPO time specified  Diet effective midnight        Diet NPO time specified  Diet effective now             EDUCATION NEEDS:   No education needs have been identified at this time  Skin:  Skin Assessment: Reviewed RN Assessment  Last BM:  10/19  Height:   Ht Readings from Last 1 Encounters:  07/28/19 5' 8"  (1.727 m)   Weight:   Wt Readings from Last 1 Encounters:  07/28/19 73.8 kg   Ideal Body Weight:  70 kg  BMI:  Body mass index is 24.75 kg/m.  Estimated Nutritional Needs:   Kcal:  1800-2000  Protein:  90-100 grams  Fluid:  1.8-2 L/day  CasKoleen Distance, RD, LDN Pager #- [phone]#- [phone] Hours Pager: [phone]

## 2019-08-03 NOTE — Progress Notes (Signed)
[place] for Apixaban Indication: pulmonary embolus  Allergies  Allergen Reactions  . Sulfa Antibiotics Nausea Only    Patient Measurements: Height: 5\' 8"  (172.7 cm) Weight: 162 lb 12.8 oz (73.8 kg) IBW/kg (Calculated) : 68.4   Vital Signs: Temp: 96.2 F (35.7 C) (10/23 1200) Temp Source: Axillary (10/23 1200) BP: 130/91 (10/23 1300) Pulse Rate: 73 (10/23 1300)  Labs: Recent Labs    08/01/19 0434 08/02/19 0521 08/03/19 0539  HGB 7.9*  --  10.6*  HCT 28.2*  --  37.8*  PLT 315  --  312  LABPROT  --   --  14.8  INR  --   --  1.2  CREATININE 0.67 0.54* 0.59*    Estimated Creatinine Clearance: 92.6 mL/min (A) (by C-G formula based on SCr of 0.59 mg/dL (L)).   Assessment: 63 yo male currently on heparin drip for PE transitioning to apixaban  Goal of Therapy:  Monitor platelets by anticoagulation protocol: Yes   Plan:  Patient started on apixaban for PE. Patient unable to take PO meds. Apixaban has been switched to therapeutic Lovenox until patient able to tolerate PO meds.  Plan is for patient to have PEG tube on Monday. Patient to remain NPO until that time. Lovenox will continue through Sunday with last dose Sunday morning at 1000. Will hold thereafter for procedure and then restart as appropriate following procedure.  Pharmacy will continue to follow.   Tawnya Crook, PharmD 08/03/2019,1:28 PM

## 2019-08-03 NOTE — Progress Notes (Signed)
   08/03/19 1600  Clinical Encounter Type  Visited With Patient  Visit Type Follow-up  Ch checked again and found the pt deep in sleep. Pt did not wake up to a verbal stimuli or light touch. Ch will check in later when the pt is awake.

## 2019-08-03 NOTE — Progress Notes (Signed)
[facility] [place] NAME: George Wade    MR#:  [id number]  DATE OF BIRTH:  Jun 14, 1956  SUBJECTIVE:   Patient is sleepy this morning.  He does answer some questions, but keeps going back to sleep.  He endorses some shortness of breath.  REVIEW OF SYSTEMS:    Unable to obtain due to altered mental status  DRUG ALLERGIES:   Allergies  Allergen Reactions  . Sulfa Antibiotics Nausea Only    VITALS:  Blood pressure (!) 141/94, pulse 71, temperature (!) 96.2 F (35.7 C), temperature source Axillary, resp. rate (!) 23, height 5\' 8"  (1.727 m), weight 73.8 kg, SpO2 97 %.  PHYSICAL EXAMINATION:  Constitutional: Appears well-developed and well-nourished. No distress. HENT: Normocephalic, atraumatic, Oropharynx is clear and moist.  Eyes: Conjunctivae and EOM are normal. PERRLA, no scleral icterus.  Neck: Normal ROM. Neck supple. No JVD. No tracheal deviation. CVS: RRR, S1/S2 +, no murmurs, no gallops, no carotid bruit.  Pulmonary: + Rhonchi present throughout the entire right lung. +diminished breath sounds in the lung bases bilaterally. [place] in place. Abdominal: Soft. BS +,  no distension, +epigastric abdominal pain, no rebound or guarding.  Musculoskeletal: Normal range of motion. No edema and no tenderness.  Neuro: Alert. CN 2-12 grossly intact. No focal deficits. Skin: Skin is warm and dry. No rash noted. Psychiatric: Normal mood and affect.   LABORATORY PANEL:   CBC Recent Labs  Lab 08/03/19 0539  WBC 8.8  HGB 10.6*  HCT 37.8*  PLT 312   ------------------------------------------------------------------------------------------------------------------  Chemistries  Recent Labs  Lab 07/30/19 0425  08/01/19 0434  08/03/19 0539  NA 140   < > 141   < > 141  K 3.7   < > 3.3*   < > 3.5  CL 109   < > 109   < > 107  CO2 28   < > 23   < > 23  GLUCOSE 94   < > 181*   < > 112*  BUN 6*   < > 13   < > 6*  CREATININE 0.57*   < > 0.67    < > 0.59*  CALCIUM 8.3*   < > 8.5*   < > 8.4*  MG 2.2   < > 2.1  --   --   AST 46*  --   --   --   --   ALT 42  --   --   --   --   ALKPHOS 158*  --   --   --   --   BILITOT 1.4*  --   --   --   --    < > = values in this interval not displayed.   ------------------------------------------------------------------------------------------------------------------  Cardiac Enzymes No results for input(s): TROPONINI in the last 168 hours. ------------------------------------------------------------------------------------------------------------------  RADIOLOGY:  No results found.   ASSESSMENT AND PLAN:   Acute hypoxic and hypercapnic respiratory failure due to aspiration pneumonia- currently on 2 L O2 by nasal cannula today -Continue unasyn -Wean O2 as able -Continue duonebs  Dysphagia- having severe aspiration episodes -EGD 10/22 with benign-appearing for esophageal stenosis that was dilated -NPO  -Patient will have PEG tube placed by IR on Monday  Small right lung pulmonary emboli- seen on CT scan -On full dose lovenox  Chronic iron deficiency anemia- hemoglobin is lower than baseline. -s/p IV iron x1 on 10/18 -Continue to monitor  DTs with a history of EtOH abuse -  Precedex gtt  Elevated LFTs- likely due to alcohol use.  AST/ALT improved. -Right upper quadrant ultrasound showed hepatic steatosis -Monitor  Tobacco use -Nicotine patch ordered  Management plans discussed with the patient and he is in agreement.  CODE STATUS: full  TOTAL TIME TAKING CARE OF THIS PATIENT: 36 minutes.   POSSIBLE D/C unknown, DEPENDING ON CLINICAL CONDITION.   [name] Mayo M.D on 08/03/2019 at 3:09 PM  Between 7am to 6pm - Pager [phone]  After 6pm go to [email or web address] - password [email or web address] [facility]  Office  [phone]  CC: Primary care physician; Patient, [name] Per  Note: This dictation was prepared with Dragon dictation along with smaller  phrase technology. Any transcriptional errors that result from this process are unintentional.

## 2019-08-03 NOTE — Progress Notes (Addendum)
Pt has remainied drowsy, confused and periods of hallucinations throughout shift. He did speak to chaplain per his request, became agitated and was given 2mg  prn ativan for his agitation. CIWA protocol was reordered q4 hrs. Left Iv infiltrated and new IV was obtained on R arm. Planning for PEG on Monday.  Pt currently resting comfortably.

## 2019-08-03 NOTE — Progress Notes (Addendum)
CRITICAL CARE NOTE  CC  follow up pulmonary emboli, esophageal stricture and alcohol withdrawal   SUBJECTIVE Difficulty eating and swallowing has not.  Pt needs PEG tube placed. Prognosis is guarded High risk for aspiration Remains on precedex fro DT's    BP 129/88   Pulse 71   Temp (!) 96.7 F (35.9 C) (Axillary)   Resp 17   Ht 5\' 8"  (1.727 m)   Wt 73.8 kg   SpO2 93%   BMI 24.75 kg/m    I/O last 3 completed shifts: In: 1042.2 [I.V.:442.2; IV TFTDDUKGU:542] Out: 7062 [Urine:2600] Total I/O In: 166.2 [I.V.:66.2; IV Piggyback:100] Out: -   SpO2: 93 % O2 Flow Rate (L/min): 2 L/min FiO2 (%): 35 %   SIGNIFICANT EVENTS 10/17: Presented to the ED with SOB and was admitted to the hospital for pulmonary emboli. He was transferred to the floor and started on a heparin drip. CIWA protocol initiated.  10/18: Attempt to switch patient from heparin drip to eliquis but patient complains of nausea and unable to tolerate anything PO. Pt became irritated and had some mild shaking. CIWA protocol continued. Potassium of 2.8, replacement initiated.  10/19: Pt complaining of difficulty swallowing and vomiting. Placed on IV PPI and made NPO.  10/20: Transferred to ICU for rapid respirations and SpO2 of 93% despite being on nonrebreather. Placed on HFNC at 15L with solumedrol and duoneb treatments. Started on unasyn for aspiration pneumonia. CIWA protocol continued.  10/21: Remains on HFNC. CIWA protocol continued. Precedex infusions for DTs 10/22: DTs treated with precedex. Pt on 2L [place]. GI consulted for endoscopy. Pt underwent endoscopy, severe esophageal stricture found. GI recommended PEG tube placement.  10/23: Pt remains on 2L O2 and precedex drip for DTs. IR consulted, and PEG tube placement scheduled for Monday. Continue lovenox for anticoagulation. Last dose should be Sunday AM.   REVIEW OF SYSTEMS - Minimal ROS due to patient's lethargy  Review of Systems  Constitutional: Negative  for chills and fever.  Respiratory: Negative for shortness of breath.        Reports breathing has improved  Cardiovascular: Negative for chest pain.  Psychiatric/Behavioral: Positive for hallucinations (Visual) and substance abuse.       States he feels "disoriented"       PHYSICAL EXAMINATION:  GENERAL: sedated on precedex, lethargic  HEAD: Normocephalic, atraumatic.  EYES: Pupils equal, round, reactive to light.  No scleral icterus.  MOUTH: Lips are dry and chapped. Moist mucosal membrane. White plaques on tongue and roof of mouth.  NECK: Supple.  PULMONARY: CTA bilaterally  CARDIOVASCULAR: S1 and S2. Regular rate and rhythm. No murmurs, rubs, or gallops.  GASTROINTESTINAL: Soft, nontender, -distended. No masses. Hypoactive bowel sounds. No hepatosplenomegaly.  MUSCULOSKELETAL: Left hand swollen. PIV placed in left forearm. All other extremities are non-edematous.  NEUROLOGIC: A+Ox4. Lethargic due to sedation SKIN:Intact,warm,dry  MEDICATIONS: I have reviewed all medications and confirmed regimen as documented   CULTURE RESULTS   Recent Results (from the past 240 hour(s))  SARS CORONAVIRUS 2 (TAT 6-24 HRS) Nasopharyngeal Nasopharyngeal Swab     Status: None   Collection Time: 07/28/19  1:19 PM   Specimen: Nasopharyngeal Swab  Result Value Ref Range Status   SARS Coronavirus 2 NEGATIVE NEGATIVE Final    Comment: (NOTE) SARS-CoV-2 target nucleic acids are NOT DETECTED. The SARS-CoV-2 RNA is generally detectable in upper and lower respiratory specimens during the acute phase of infection. Negative results do not preclude SARS-CoV-2 infection, do not rule out co-infections with other  pathogens, and should not be used as the sole basis for treatment or other patient management decisions. Negative results must be combined with clinical observations, patient history, and epidemiological information. The expected result is Negative. Fact Sheet for  Patients: SugarRoll.be Fact Sheet for Healthcare Providers: https://www.woods-mathews.com/ This test is not yet approved or cleared by the [place] FDA and  has been authorized for detection and/or diagnosis of SARS-CoV-2 by FDA under an Emergency Use Authorization (EUA). This EUA will remain  in effect (meaning this test can be used) for the duration of the COVID-19 declaration under Section 56 4(b)(1) of the Act, 21 U.S.C. section 360bbb-3(b)(1), unless the authorization is terminated or revoked sooner. Performed at [facility], [place] [address], [place], [place] [address]   MRSA PCR Screening     Status: None   Collection Time: 07/31/19  6:20 AM   Specimen: Nasopharyngeal  Result Value Ref Range Status   MRSA by PCR NEGATIVE NEGATIVE Final    Comment:        The GeneXpert MRSA Assay (FDA approved for NASAL specimens only), is one component of a comprehensive MRSA colonization surveillance program. It is not intended to diagnose MRSA infection nor to guide or monitor treatment for MRSA infections. Performed at [facility], [address] [place], [place] [address]          ASSESSMENT AND PLAN SYNOPSIS   Severe ACUTE Hypoxic and Hypercapnic Respiratory Failure d/t aspiration pneumonia  -Continue 2L O2 via [place]  -Monitor RR, SpO2 and symptoms  -continue unasyn  -high risk for intubation  -high risk aspiration    NEUROLOGY - Alcohol Withdrawal  -Therapy with thiamine and MVI  -Sedated with precedex for DTs. Continue precedex drip  -Pt is lethargic, but A+Ox4.  -Pt continues to have visual hallucinations.  -Continue CIWA protocol   CARDIAC -ICU monitoring -Continue lovenox injections for anticoagulation. D/C after Sunday AM dose d/t PEG tube procedure on Monday  ID -continue IV abx as prescibed -follow up cultures  GI -GI consulted. PEG tube placement recommended due to severe esophageal  stricture. Scheduled for Monday by IR -GI PROPHYLAXIS as indicated  NUTRITIONAL STATUS DIET--> NPO Constipation protocol as indicated  ENDO - will use ICU hypoglycemic\Hyperglycemia protocol if indicated   ELECTROLYTES -follow labs as needed -replace as needed -pharmacy consultation and following   DVT/GI PRX ordered TRANSFUSIONS AS NEEDED MONITOR FSBS ASSESS the need for LABS as needed   Critical Care Time devoted to patient care services described in this note is 32 minutes.   Overall, patient is critically ill, prognosis is guarded.     Corrin Parker, M.D.  [facility]  Medical Director [place] Director [facility] Cardio-Pulmonary Department

## 2019-08-03 NOTE — Progress Notes (Signed)
Pharmacy Antibiotic Note  George Wade. is a 63 y.o. male admitted on 07/28/2019 with aspiration pneumonia.  Pharmacy has been consulted for Unasyn dosing. Plan on a stop date of 7 day therapy for Unasyn.   Plan: -Continue Unasyn 3g IV q6h, day 4 -Monitor renal function and s/sx of infection and adverse events  Height: 5\' 8"  (172.7 cm) Weight: 162 lb 12.8 oz (73.8 kg) IBW/kg (Calculated) : 68.4  Temp (24hrs), Avg:97.3 F (36.3 C), Min:96.7 F (35.9 C), Max:98.4 F (36.9 C)  Recent Labs  Lab 07/29/19 0434  07/30/19 0425 07/31/19 0352 08/01/19 0434 08/02/19 0521 08/03/19 0539  WBC 8.6  --  7.5 23.2* 11.2*  --  8.8  CREATININE  --    < > 0.57* 0.67 0.67 0.54* 0.59*   < > = values in this interval not displayed.    Estimated Creatinine Clearance: 92.6 mL/min (A) (by C-G formula based on SCr of 0.59 mg/dL (L)).    Allergies  Allergen Reactions  . Sulfa Antibiotics Nausea Only   Antimicrobials this admission: Unasyn 10/20 >>10/26  Microbiology Results: 10/20 MRSA PCR: negative 10/17 SARS-CoV-2: negative  Thank you for allowing pharmacy to be a part of this patient's care.  Sallye Lat, PharmD Candidate 08/03/2019 10:56 AM

## 2019-08-03 NOTE — Progress Notes (Signed)
Noting per CCU's team request,for PEG placement. Will plan to attempt placement on Monday 08/06/2019. Spoke with Dr Mortimer Fries earlier today, to place orders for PT/INR/CBC on 08/06/2019 @ 0500 prior to procedure as well as NPO after mn prior to procedure.

## 2019-08-03 NOTE — Progress Notes (Signed)
The patient underwent an EGD with very severe esophageal stricture with inability to pass the scope despite dilation.  Is recommended that the patient undergo a PEG tube placement via radiology for nutrition.  Nothing further to do from a GI point of view.  I will sign off.  Please call if any further GI concerns or questions.  We would like to thank you for the opportunity to participate in the care of [employer].Marland Kitchen

## 2019-08-04 ENCOUNTER — Inpatient Hospital Stay: Payer: Self-pay

## 2019-08-04 LAB — BASIC METABOLIC PANEL
Anion gap: 10 (ref 5–15)
BUN: <5 mg/dL — ABNORMAL LOW (ref 8–23)
CO2: 21 mmol/L — ABNORMAL LOW (ref 22–32)
Calcium: 8.3 mg/dL — ABNORMAL LOW (ref 8.9–10.3)
Chloride: 105 mmol/L (ref 98–111)
Creatinine, Ser: 0.5 mg/dL — ABNORMAL LOW (ref 0.61–1.24)
GFR calc Af Amer: >60 mL/min (ref >60)
GFR calc non Af Amer: >60 mL/min (ref >60)
Glucose, Bld: 121 mg/dL — ABNORMAL HIGH (ref 70–99)
Potassium: 3.4 mmol/L — ABNORMAL LOW (ref 3.5–5.1)
Sodium: 136 mmol/L (ref 135–145)

## 2019-08-04 LAB — PHOSPHORUS: Phosphorus: 3.2 mg/dL (ref 2.5–4.6)

## 2019-08-04 LAB — MAGNESIUM: Magnesium: 1.9 mg/dL (ref 1.7–2.4)

## 2019-08-04 LAB — GLUCOSE, CAPILLARY: Glucose-Capillary: 110 mg/dL — ABNORMAL HIGH (ref 70–99)

## 2019-08-04 MED ORDER — POTASSIUM CHLORIDE 10 MEQ/100ML IV SOLN
10.0000 meq | INTRAVENOUS | Status: AC
  Administered 2019-08-04 (×2): 10 meq via INTRAVENOUS
  Filled 2019-08-04 (×2): qty 100

## 2019-08-04 MED ORDER — FUROSEMIDE 10 MG/ML IJ SOLN: 40.0000 mg | Freq: Once | INTRAMUSCULAR | Status: DC

## 2019-08-04 MED ORDER — POTASSIUM CHLORIDE 10 MEQ/100ML IV SOLN
10.0000 meq | INTRAVENOUS | Status: AC
  Administered 2019-08-04: 10 meq via INTRAVENOUS
  Filled 2019-08-04 (×2): qty 100

## 2019-08-04 MED ORDER — LACTATED RINGERS IV BOLUS
1000.0000 mL | Freq: Once | INTRAVENOUS | Status: AC
  Administered 2019-08-04: 1000 mL via INTRAVENOUS

## 2019-08-04 MED ORDER — FUROSEMIDE 10 MG/ML IJ SOLN
INTRAMUSCULAR | Status: AC
  Filled 2019-08-04: qty 4

## 2019-08-04 NOTE — Anesthesia Postprocedure Evaluation (Signed)
Anesthesia Post Note  Patient: Hughie Melroy.  Procedure(s) Performed: ESOPHAGOGASTRODUODENOSCOPY (EGD) WITH PROPOFOL (N/A )  Patient location during evaluation: Endoscopy Anesthesia Type: General Level of consciousness: awake and alert Pain management: pain level controlled Vital Signs Assessment: post-procedure vital signs reviewed and stable Respiratory status: spontaneous breathing, nonlabored ventilation, respiratory function stable and patient connected to nasal cannula oxygen Cardiovascular status: blood pressure returned to baseline and stable Postop Assessment: no apparent nausea or vomiting Anesthetic complications: no     Last Vitals:  Vitals:   08/04/19 1040 08/04/19 1045  BP: (!) 75/66 (!) 86/69  Pulse: 73 72  Resp: (!) 24 (!) 26  Temp:    SpO2: 95% 92%    Last Pain:  Vitals:   08/04/19 1033  TempSrc: Axillary  PainSc:                  Savior Himebaugh S

## 2019-08-04 NOTE — Progress Notes (Signed)
[facility] [place] NAME: George Wade    MR#:  [id number]  DATE OF BIRTH:  1955/10/31  SUBJECTIVE:   Patient is sleepy this morning.  He is on precedex  REVIEW OF SYSTEMS:    Unable to obtain due to altered mental status  DRUG ALLERGIES:   Allergies  Allergen Reactions  . Sulfa Antibiotics Nausea Only    VITALS:  Blood pressure (!) 76/55, pulse 80, temperature (!) (P) 97.5 F (36.4 C), temperature source (P) Axillary, resp. rate 20, height 5\' 8"  (1.727 m), weight 73.8 kg, SpO2 94 %.  PHYSICAL EXAMINATION:  Constitutional: Appears well-developed and well-nourished. No distress. HENT: Normocephalic, atraumatic, Oropharynx is clear and moist.  Eyes: Conjunctivae and EOM are normal. PERRLA, no scleral icterus.  Neck: Normal ROM. Neck supple. No JVD. No tracheal deviation. CVS: RRR, S1/S2 +, no murmurs, no gallops, no carotid bruit.  Pulmonary: + Rhonchi present throughout the entire right lung. +diminished breath sounds in the lung bases bilaterally. [place] in place. Abdominal: Soft. BS +,  no distension, +epigastric abdominal pain, no rebound or guarding.  Musculoskeletal: Normal range of motion. No edema and no tenderness.  Neuro: Alert. CN 2-12 grossly intact. No focal deficits. Skin: Skin is warm and dry. No rash noted. Psychiatric: Normal mood and affect.   LABORATORY PANEL:   CBC Recent Labs  Lab 08/03/19 0539  WBC 8.8  HGB 10.6*  HCT 37.8*  PLT 312   ------------------------------------------------------------------------------------------------------------------  Chemistries  Recent Labs  Lab 07/30/19 0425  08/04/19 0438  NA 140   < > 136  K 3.7   < > 3.4*  CL 109   < > 105  CO2 28   < > 21*  GLUCOSE 94   < > 121*  BUN 6*   < > <5*  CREATININE 0.57*   < > 0.50*  CALCIUM 8.3*   < > 8.3*  MG 2.2   < > 1.9  AST 46*  --   --   ALT 42  --   --   ALKPHOS 158*  --   --   BILITOT 1.4*  --   --    < > = values  in this interval not displayed.   ------------------------------------------------------------------------------------------------------------------  Cardiac Enzymes No results for input(s): TROPONINI in the last 168 hours. ------------------------------------------------------------------------------------------------------------------  RADIOLOGY:  Dg Chest Port 1 View  Result Date: 08/04/2019 CLINICAL DATA:  Pulmonary opacities EXAM: PORTABLE CHEST 1 VIEW COMPARISON:  07/31/2019 chest radiograph. FINDINGS: Low lung volumes. Stable cardiomediastinal silhouette with normal heart size. No pneumothorax. No pleural effusion. Patchy right greater than left parahilar and left lung base opacities appear slightly worsened. IMPRESSION: 1. Low lung volumes. 2. Patchy right greater than left parahilar and left lung base opacities appear slightly worsened, compatible with pneumonia, aspiration and/or atelectasis. Chest radiograph follow-up advised. Electronically Signed   By: Ilona Sorrel M.D.   On: 08/04/2019 10:45     ASSESSMENT AND PLAN:   Acute hypoxic and hypercapnic respiratory failure due to aspiration pneumonia- currently on 6L O2 by nasal cannula today -Continue unasyn -Wean O2 as able -Continue duonebs  Dysphagia- having severe aspiration episodes -EGD 10/22 with benign-appearing for esophageal stenosis that was dilated -NPO  -Patient will have PEG tube placed by IR on Monday  Small right lung pulmonary emboli- seen on CT scan -On full dose lovenox  Chronic iron deficiency anemia- hemoglobin is lower than baseline. -s/p IV iron x1 on 10/18 -Continue  to monitor  DTs with a history of EtOH abuse -Precedex gtt  Elevated LFTs- likely due to alcohol use.  AST/ALT improved. -Right upper quadrant ultrasound showed hepatic steatosis -Monitor  Tobacco use -Nicotine patch ordered  DVT proph;  Management plans discussed with the patient and he is in agreement.  CODE STATUS:  full  TOTAL TIME TAKING CARE OF THIS PATIENT: 36 minutes.   POSSIBLE D/C unknown, DEPENDING ON CLINICAL CONDITION.   George Wade M.D on 08/04/2019 at 3:24 PM  Between 7am to 6pm - Pager - [phone]  After 6pm go to [email or web address] - password [email or web address] [facility]  Office  [phone]  CC: Primary care physician; Patient, [name] Per  Note: This dictation was prepared with Dragon dictation along with smaller phrase technology. Any transcriptional errors that result from this process are unintentional.

## 2019-08-04 NOTE — Progress Notes (Signed)
Shift summary:  - Patient remains on Precedex.  - Plan for PEG in IR on Monday.

## 2019-08-04 NOTE — Plan of Care (Signed)
Patient has cognitive impairment limiting the progression of several goals

## 2019-08-04 NOTE — Progress Notes (Signed)
CRITICAL CARE NOTE  CC  follow up pulmonary emboli, esophageal stricture and alcohol withdrawal   SUBJECTIVE Difficulty eating and swallowing has not.  Pt needs PEG tube placed. Prognosis is guarded High risk for aspiration Remains on precedex fro DT's    BP 102/78 (BP Location: Right Arm)   Pulse 72   Temp (!) 96.9 F (36.1 C) (Axillary)   Resp (!) 23   Ht 5\' 8"  (1.727 m)   Wt 73.8 kg   SpO2 98%   BMI 24.75 kg/m    I/O last 3 completed shifts: In: 3292.3 [I.V.:1826.3; IV Piggyback:1466] Out: 0737 [Urine:2300] Total I/O In: 179.7 [I.V.:18.6; IV Piggyback:161.2] Out: 0   SpO2: 98 % O2 Flow Rate (L/min): 2 L/min FiO2 (%): 35 %   SIGNIFICANT EVENTS 10/17: Presented to the ED with SOB and was admitted to the hospital for pulmonary emboli. He was transferred to the floor and started on a heparin drip. CIWA protocol initiated.  10/18: Attempt to switch patient from heparin drip to eliquis but patient complains of nausea and unable to tolerate anything PO. Pt became irritated and had some mild shaking. CIWA protocol continued. Potassium of 2.8, replacement initiated.  10/19: Pt complaining of difficulty swallowing and vomiting. Placed on IV PPI and made NPO.  10/20: Transferred to ICU for rapid respirations and SpO2 of 93% despite being on nonrebreather. Placed on HFNC at 15L with solumedrol and duoneb treatments. Started on unasyn for aspiration pneumonia. CIWA protocol continued.  10/21: Remains on HFNC. CIWA protocol continued. Precedex infusions for DTs 10/22: DTs treated with precedex. Pt on 2L [place]. GI consulted for endoscopy. Pt underwent endoscopy, severe esophageal stricture found. GI recommended PEG tube placement.  10/23: Pt remains on 2L O2 and precedex drip for DTs. IR consulted, and PEG tube placement scheduled for Monday. Continue lovenox for anticoagulation. Last dose should be Sunday AM. 10/24 -Patient is in no distress, able to communicate without encouragement.     REVIEW OF SYSTEMS - Minimal ROS due to patient's lethargy  Review of Systems  Constitutional: Negative for chills and fever.  Respiratory: Negative for shortness of breath.        Reports breathing has improved  Cardiovascular: Negative for chest pain.  Psychiatric/Behavioral: Positive for hallucinations (Visual) and substance abuse.       States he feels "disoriented"       PHYSICAL EXAMINATION:  GENERAL: sedated on precedex, lethargic  HEAD: Normocephalic, atraumatic.  EYES: Pupils equal, round, reactive to light.  No scleral icterus.  MOUTH: Lips are dry and chapped. Moist mucosal membrane. White plaques on tongue and roof of mouth.  NECK: Supple.  PULMONARY: CTA bilaterally  CARDIOVASCULAR: S1 and S2. Regular rate and rhythm. No murmurs, rubs, or gallops.  GASTROINTESTINAL: Soft, nontender, -distended. No masses. Hypoactive bowel sounds. No hepatosplenomegaly.  MUSCULOSKELETAL: Left hand swollen. PIV placed in left forearm. All other extremities are non-edematous.  NEUROLOGIC: A+Ox4. Lethargic due to sedation SKIN:Intact,warm,dry  MEDICATIONS: I have reviewed all medications and confirmed regimen as documented   CULTURE RESULTS   Recent Results (from the past 240 hour(s))  SARS CORONAVIRUS 2 (TAT 6-24 HRS) Nasopharyngeal Nasopharyngeal Swab     Status: None   Collection Time: 07/28/19  1:19 PM   Specimen: Nasopharyngeal Swab  Result Value Ref Range Status   SARS Coronavirus 2 NEGATIVE NEGATIVE Final    Comment: (NOTE) SARS-CoV-2 target nucleic acids are NOT DETECTED. The SARS-CoV-2 RNA is generally detectable in upper and lower respiratory specimens during the acute  phase of infection. Negative results do not preclude SARS-CoV-2 infection, do not rule out co-infections with other pathogens, and should not be used as the sole basis for treatment or other patient management decisions. Negative results must be combined with clinical observations, patient history,  and epidemiological information. The expected result is Negative. Fact Sheet for Patients: SugarRoll.be Fact Sheet for Healthcare Providers: https://www.woods-mathews.com/ This test is not yet approved or cleared by the [place] FDA and  has been authorized for detection and/or diagnosis of SARS-CoV-2 by FDA under an Emergency Use Authorization (EUA). This EUA will remain  in effect (meaning this test can be used) for the duration of the COVID-19 declaration under Section 56 4(b)(1) of the Act, 21 U.S.C. section 360bbb-3(b)(1), unless the authorization is terminated or revoked sooner. Performed at [facility], [place] [address], [place], [place] [address]   MRSA PCR Screening     Status: None   Collection Time: 07/31/19  6:20 AM   Specimen: Nasopharyngeal  Result Value Ref Range Status   MRSA by PCR NEGATIVE NEGATIVE Final    Comment:        The GeneXpert MRSA Assay (FDA approved for NASAL specimens only), is one component of a comprehensive MRSA colonization surveillance program. It is not intended to diagnose MRSA infection nor to guide or monitor treatment for MRSA infections. Performed at [facility], [address] [place], [place] [address]          ASSESSMENT AND PLAN SYNOPSIS   Severe ACUTE Hypoxic and Hypercapnic Respiratory Failure d/t aspiration pneumonia  -Continue 2L O2 via   -Monitor RR, SpO2 and symptoms  -continue unasyn  -high risk for intubation  -high risk aspiration    Alcohol withdrawal syndrome   - [place] ETOH withdrawal protocol -Therapy with thiamine and MVI  -Sedated with precedex for DTs. Continue precedex drip  -Pt is lethargic, but A+Ox4.  -Pt continues to have visual hallucinations.  -Continue CIWA protocol   CARDIAC -ICU monitoring -Continue lovenox injections for anticoagulation. D/C after Sunday AM dose d/t PEG tube procedure on Monday  ID -continue IV  abx as prescibed -follow up cultures  GI -GI consulted. PEG tube placement recommended due to severe esophageal stricture. Scheduled for Monday by IR -GI PROPHYLAXIS as indicated  NUTRITIONAL STATUS DIET--> NPO Constipation protocol as indicated -phos/mag/k - monitor for refeeeding syndrome  ENDO - will use ICU hypoglycemic\Hyperglycemia protocol if indicated   ELECTROLYTES -follow labs as needed -replace as needed -pharmacy consultation and following   DVT/GI PRX ordered TRANSFUSIONS AS NEEDED MONITOR FSBS ASSESS the need for LABS as needed   Critical Care Time devoted to patient care services described in this note is 32 minutes.   Overall, patient is critically ill, prognosis is guarded.      Ottie Glazier, M.D.  Pulmonary & [place]

## 2019-08-04 NOTE — Plan of Care (Signed)

## 2019-08-04 NOTE — Progress Notes (Signed)
Pharmacy Electrolyte Monitoring Consult:  63 y.o. male admitted on 07/28/2019 with Cough and Chest Pain related to an acute PE.  Because of his alcohol use disorder he remains at a high risk for refeeding syndrome.    Labs:  Sodium (mmol/L)  Date Value  08/04/2019 136   Potassium (mmol/L)  Date Value  08/04/2019 3.4 (L)   Magnesium (mg/dL)  Date Value  08/04/2019 1.9   Phosphorus (mg/dL)  Date Value  08/04/2019 3.2   Calcium (mg/dL)  Date Value  08/04/2019 8.3 (L)   Albumin (g/dL)  Date Value  07/31/2019 2.7 (L)   Corrected Ca: 9.44 mg/dL  Plan:  -Patient has aspirated several times. Patient is unable to take any PO meds.  -Plan on having PEG tube on Monday. Patient will remain NPO until then.  -Will replace electrolytes as indicated as patient is not currently able to received nutritional. -Will replenished with Potassium 10 mEq IV x4.  -Recheck electrolytes tomorrow with am labs  Pharmacy will continue to follow.  Olivia Canter, [facility] 08/04/2019 8:09 AM

## 2019-08-05 LAB — CBC WITH DIFFERENTIAL/PLATELET
Abs Immature Granulocytes: 0.06 10*3/uL (ref 0.00–0.07)
Basophils Absolute: 0 10*3/uL (ref 0.0–0.1)
Basophils Relative: 0 %
Eosinophils Absolute: 0.1 10*3/uL (ref 0.0–0.5)
Eosinophils Relative: 1 %
HCT: 30.3 % — ABNORMAL LOW (ref 39.0–52.0)
Hemoglobin: 8.7 g/dL — ABNORMAL LOW (ref 13.0–17.0)
Immature Granulocytes: 1 %
Lymphocytes Relative: 11 %
Lymphs Abs: 0.9 10*3/uL (ref 0.7–4.0)
MCH: 23.7 pg — ABNORMAL LOW (ref 26.0–34.0)
MCHC: 28.7 g/dL — ABNORMAL LOW (ref 30.0–36.0)
MCV: 82.6 fL (ref 80.0–100.0)
Monocytes Absolute: 0.6 10*3/uL (ref 0.1–1.0)
Monocytes Relative: 7 %
Neutro Abs: 7.3 10*3/uL (ref 1.7–7.7)
Neutrophils Relative %: 80 %
Platelets: 289 10*3/uL (ref 150–400)
RBC: 3.67 MIL/uL — ABNORMAL LOW (ref 4.22–5.81)
RDW: 26.5 % — ABNORMAL HIGH (ref 11.5–15.5)
WBC: 9 10*3/uL (ref 4.0–10.5)
nRBC: 0 % (ref 0.0–0.2)

## 2019-08-05 LAB — BASIC METABOLIC PANEL
Anion gap: 14 (ref 5–15)
BUN: 6 mg/dL — ABNORMAL LOW (ref 8–23)
CO2: 18 mmol/L — ABNORMAL LOW (ref 22–32)
Calcium: 8.1 mg/dL — ABNORMAL LOW (ref 8.9–10.3)
Chloride: 107 mmol/L (ref 98–111)
Creatinine, Ser: 0.54 mg/dL — ABNORMAL LOW (ref 0.61–1.24)
GFR calc Af Amer: >60 mL/min (ref >60)
GFR calc non Af Amer: >60 mL/min (ref >60)
Glucose, Bld: 88 mg/dL (ref 70–99)
Potassium: 3.3 mmol/L — ABNORMAL LOW (ref 3.5–5.1)
Sodium: 139 mmol/L (ref 135–145)

## 2019-08-05 LAB — POTASSIUM: Potassium: 3.7 mmol/L (ref 3.5–5.1)

## 2019-08-05 LAB — MAGNESIUM: Magnesium: 1.8 mg/dL (ref 1.7–2.4)

## 2019-08-05 MED ORDER — POTASSIUM CHLORIDE 10 MEQ/100ML IV SOLN
10.0000 meq | INTRAVENOUS | Status: AC
  Administered 2019-08-05 (×2): 10 meq via INTRAVENOUS
  Filled 2019-08-05 (×2): qty 100

## 2019-08-05 MED ORDER — SODIUM CHLORIDE 0.9 % IV BOLUS
500.0000 mL | Freq: Once | INTRAVENOUS | Status: AC
  Administered 2019-08-05: 500 mL via INTRAVENOUS

## 2019-08-05 NOTE — Consult Note (Signed)
Reason for Consult: Left hand infection Referring Physician: Dr. Edmonia James. is an 63 y.o. male.  HPI: Patient is has been in the hospital for several days with significant problems related to PE and alcohol abuse.  He is noted to have left dorsal hand infection today that is worsened and I was consulted for evaluation.  Difficult to tell the patient is able to understand my questions but he states that he has not been out in the woods or had other exposure to tick bites recently.    Past Medical History:  Diagnosis Date  . Alcoholism with psychosis with complication, with hallucinations ([place])   . ED (erectile dysfunction)   . Fatigue   . History of pneumonia   . Hypercholesteremia   . Hypertension   . Lymphoma ([place]) 2008   non hodgkins lymphoma  . Rectal bleeding   . 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 ESOPHAGOGASTRODUODENOSCOPY N/A 05/18/2019   Procedure: ESOPHAGOGASTRODUODENOSCOPY (EGD);  Surgeon: Lin Landsman, MD;  Location: [facility] ENDOSCOPY;  Service: Gastroenterology;  Laterality: N/A;  . ESOPHAGOGASTRODUODENOSCOPY (EGD) WITH PROPOFOL N/A 08/02/2019   Procedure: ESOPHAGOGASTRODUODENOSCOPY (EGD) WITH PROPOFOL;  Surgeon: [name], MD;  Location: [facility] ENDOSCOPY;  Service: Endoscopy;  Laterality: N/A;  . [place]  . JOINT REPLACEMENT    . REPLACEMENT TOTAL KNEE BILATERAL  2009  . TONSILLECTOMY  2008   with neck dissection     Family History  Problem Relation Age of Onset  . Cancer Mother   . Hypertension Father   . Cancer Paternal Grandmother     Social History:  reports that he has never smoked. He has never used smokeless tobacco. He reports current alcohol use of about 5.0 standard drinks of alcohol per week. He reports that he does not use drugs.  Allergies:  Allergies  Allergen Reactions  . Sulfa Antibiotics Nausea Only    Medications: I have  reviewed the patient's current medications.  Results for orders placed or performed during the hospital encounter of 07/28/19 (from the past 48 hour(s))  Basic metabolic panel     Status: Abnormal   Collection Time: 08/04/19  4:38 AM  Result Value Ref Range   Sodium 136 135 - 145 mmol/L   Potassium 3.4 (L) 3.5 - 5.1 mmol/L   Chloride 105 98 - 111 mmol/L   CO2 21 (L) 22 - 32 mmol/L   Glucose, Bld 121 (H) 70 - 99 mg/dL   BUN <5 (L) 8 - 23 mg/dL   Creatinine, Ser 0.50 (L) 0.61 - 1.24 mg/dL   Calcium 8.3 (L) 8.9 - 10.3 mg/dL   GFR calc non Af Amer >60 >60 mL/min   GFR calc Af Amer >60 >60 mL/min   Anion gap 10 5 - 15    Comment: Performed at [facility], [address] [place], [place] [address]  Magnesium     Status: None   Collection Time: 08/04/19  4:38 AM  Result Value Ref Range   Magnesium 1.9 1.7 - 2.4 mg/dL    Comment: Performed at [facility], [address], [place], [place] [address]  Phosphorus     Status: None   Collection Time: 08/04/19  4:38 AM  Result Value Ref Range   Phosphorus 3.2 2.5 - 4.6 mg/dL    Comment: Performed at [facility], [address] [place],  [place] [address]  Glucose, capillary     Status: Abnormal   Collection Time: 08/04/19 10:22 AM  Result Value Ref Range   Glucose-Capillary 110 (H) 70 - 99 mg/dL  CULTURE, BLOOD (ROUTINE X 2) w Reflex to ID Panel     Status: None (Preliminary result)   Collection Time: 08/04/19 10:48 AM   Specimen: BLOOD  Result Value Ref Range   Specimen Description BLOOD Blood Culture adequate volume    Special Requests      BOTTLES DRAWN AEROBIC AND ANAEROBIC BLOOD RIGHT HAND   Culture      NO GROWTH < 24 HOURS Performed at [facility], [address] [place], [place] [address]    Report Status PENDING   CULTURE, BLOOD (ROUTINE X 2) w Reflex to ID Panel     Status: None (Preliminary result)   Collection Time: 08/04/19 10:54 AM   Specimen: BLOOD  Result Value Ref Range    Specimen Description      BLOOD Blood Culture results may not be optimal due to an excessive volume of blood received in culture bottles   Special Requests      BOTTLES DRAWN AEROBIC AND ANAEROBIC LEFT ANTECUBITAL   Culture      NO GROWTH < 24 HOURS Performed at [facility], [address] [place], [place] [address]    Report Status PENDING   Basic metabolic panel     Status: Abnormal   Collection Time: 08/05/19  4:12 AM  Result Value Ref Range   Sodium 139 135 - 145 mmol/L   Potassium 3.3 (L) 3.5 - 5.1 mmol/L   Chloride 107 98 - 111 mmol/L   CO2 18 (L) 22 - 32 mmol/L   Glucose, Bld 88 70 - 99 mg/dL   BUN 6 (L) 8 - 23 mg/dL   Creatinine, Ser 0.54 (L) 0.61 - 1.24 mg/dL   Calcium 8.1 (L) 8.9 - 10.3 mg/dL   GFR calc non Af Amer >60 >60 mL/min   GFR calc Af Amer >60 >60 mL/min   Anion gap 14 5 - 15    Comment: Performed at [facility], [address] [place], [place] [address]  CBC with Differential/Platelet     Status: Abnormal   Collection Time: 08/05/19  4:12 AM  Result Value Ref Range   WBC 9.0 4.0 - 10.5 K/uL   RBC 3.67 (L) 4.22 - 5.81 MIL/uL   Hemoglobin 8.7 (L) 13.0 - 17.0 g/dL   HCT 30.3 (L) 39.0 - 52.0 %   MCV 82.6 80.0 - 100.0 fL   MCH 23.7 (L) 26.0 - 34.0 pg   MCHC 28.7 (L) 30.0 - 36.0 g/dL   RDW 26.5 (H) 11.5 - 15.5 %   Platelets 289 150 - 400 K/uL   nRBC 0.0 0.0 - 0.2 %   Neutrophils Relative % 80 %   Neutro Abs 7.3 1.7 - 7.7 K/uL   Lymphocytes Relative 11 %   Lymphs Abs 0.9 0.7 - 4.0 K/uL   Monocytes Relative 7 %   Monocytes Absolute 0.6 0.1 - 1.0 K/uL   Eosinophils Relative 1 %   Eosinophils Absolute 0.1 0.0 - 0.5 K/uL   Basophils Relative 0 %   Basophils Absolute 0.0 0.0 - 0.1 K/uL   Immature Granulocytes 1 %   Abs Immature Granulocytes 0.06 0.00 - 0.07 K/uL    Comment: Performed at [facility], [address], [place], [place] [address]  Magnesium     Status: None  Collection Time: 08/05/19  4:12 AM  Result Value Ref  Range   Magnesium 1.8 1.7 - 2.4 mg/dL    Comment: Performed at [facility], [address] [place], [place] [address]    Dg Chest Port 1 View  Result Date: 08/04/2019 CLINICAL DATA:  Pulmonary opacities EXAM: PORTABLE CHEST 1 VIEW COMPARISON:  07/31/2019 chest radiograph. FINDINGS: Low lung volumes. Stable cardiomediastinal silhouette with normal heart size. No pneumothorax. No pleural effusion. Patchy right greater than left parahilar and left lung base opacities appear slightly worsened. IMPRESSION: 1. Low lung volumes. 2. Patchy right greater than left parahilar and left lung base opacities appear slightly worsened, compatible with pneumonia, aspiration and/or atelectasis. Chest radiograph follow-up advised. Electronically Signed   By: Ilona Sorrel M.D.   On: 08/04/2019 10:45    ROS Blood pressure (!) 148/86, pulse 66, temperature 99.1 F (37.3 C), temperature source Axillary, resp. rate 18, height 5\' 8"  (1.727 m), weight 73.8 kg, SpO2 100 %. Physical Exam he has approximately 2 and half millimeter scab on the dorsum of the hand between the fourth and fifth metacarpals with erythema in the surrounding area.  There is no fluctuance.  The scab was removed and on probing there was no tracking and no purulent material.  No culture was obtained because of this  Assessment/Plan: Left hand cellulitis probably secondary to a bug bite.  With scab removed hopefully this will drain and hand is wrapped we will recheck and see if this resolves with his current antibiotics.  No evidence for deeper infection that would might require irrigation and debridement.  Hessie Knows 08/05/2019, 10:31 AM

## 2019-08-05 NOTE — Progress Notes (Signed)
Pharmacy Electrolyte Monitoring Consult:  63 y.o. male admitted on 07/28/2019 with Cough and Chest Pain related to an acute PE.  Because of his alcohol use disorder he remains at a high risk for refeeding syndrome.    Labs:  Sodium (mmol/L)  Date Value  08/05/2019 139   Potassium (mmol/L)  Date Value  08/05/2019 3.3 (L)   Magnesium (mg/dL)  Date Value  08/05/2019 1.8   Phosphorus (mg/dL)  Date Value  08/04/2019 3.2   Calcium (mg/dL)  Date Value  08/05/2019 8.1 (L)   Albumin (g/dL)  Date Value  07/31/2019 2.7 (L)   Corrected Ca: 9.44 mg/dL  Plan:  -Patient has aspirated several times. Patient is unable to take any PO meds.  -Plan on having PEG tube on Monday. Patient will remain NPO until then.  -Will replace electrolytes as indicated as patient is not currently able to received nutritional. -Will replenished with Potassium 10 mEq IV x4.   -Recheck K tonight at 18:00. -Recheck electrolytes tomorrow with am labs  Pharmacy will continue to follow.  Olivia Canter, [facility] 08/05/2019 8:56 AM

## 2019-08-05 NOTE — Progress Notes (Signed)
Patient is now able to sleep at this time after being awake until now. Patient was bolus with 500cc NS for no urine output and bladder scan for zero. Telesitter has be instrumental in assisting with maintaining the patient's safety this shift. Will continue to monitor and endorse.

## 2019-08-05 NOTE — Progress Notes (Signed)
[place] [facility] [place] NAME: George Wade    MR#:  [id number]  DATE OF BIRTH:  1956/05/20  SUBJECTIVE:   Patient is sleepy this morning.  He is on precedex.  Intensivist consulted orthopedic physician this morning to evaluate left hand for possible infection.  REVIEW OF SYSTEMS:    Unable to obtain due to altered mental status  DRUG ALLERGIES:   Allergies  Allergen Reactions  . Sulfa Antibiotics Nausea Only    VITALS:  Blood pressure (!) 141/88, pulse 64, temperature 99.1 F (37.3 C), temperature source Axillary, resp. rate 20, height 5\' 8"  (1.727 m), weight 73.8 kg, SpO2 94 %.  PHYSICAL EXAMINATION:  Constitutional: Appears well-developed and well-nourished. No distress. HENT: Normocephalic, atraumatic, Oropharynx is clear and moist.  Eyes: Conjunctivae and EOM are normal. PERRLA, no scleral icterus.  Neck: Normal ROM. Neck supple. No JVD. No tracheal deviation. CVS: RRR, S1/S2 +, no murmurs, no gallops, no carotid bruit.  Pulmonary: + Rhonchi present throughout the entire right lung. +diminished breath sounds in the lung bases bilaterally. [place] in place. Abdominal: Soft. BS +,  no distension, +epigastric abdominal pain, no rebound or guarding.  Musculoskeletal: Normal range of motion. No edema and no tenderness.  Neuro: Alert. CN 2-12 grossly intact. No focal deficits. Skin: Skin is warm and dry. No rash noted. Psychiatric: Normal mood and affect.   LABORATORY PANEL:   CBC Recent Labs  Lab 08/05/19 0412  WBC 9.0  HGB 8.7*  HCT 30.3*  PLT 289   ------------------------------------------------------------------------------------------------------------------  Chemistries  Recent Labs  Lab 07/30/19 0425  08/05/19 0412  NA 140   < > 139  K 3.7   < > 3.3*  CL 109   < > 107  CO2 28   < > 18*  GLUCOSE 94   < > 88  BUN 6*   < > 6*  CREATININE 0.57*   < > 0.54*  CALCIUM 8.3*   < > 8.1*  MG 2.2   < > 1.8  AST 46*  --    --   ALT 42  --   --   ALKPHOS 158*  --   --   BILITOT 1.4*  --   --    < > = values in this interval not displayed.   ------------------------------------------------------------------------------------------------------------------  Cardiac Enzymes No results for input(s): TROPONINI in the last 168 hours. ------------------------------------------------------------------------------------------------------------------  RADIOLOGY:  Dg Chest Port 1 View  Result Date: 08/04/2019 CLINICAL DATA:  Pulmonary opacities EXAM: PORTABLE CHEST 1 VIEW COMPARISON:  07/31/2019 chest radiograph. FINDINGS: Low lung volumes. Stable cardiomediastinal silhouette with normal heart size. No pneumothorax. No pleural effusion. Patchy right greater than left parahilar and left lung base opacities appear slightly worsened. IMPRESSION: 1. Low lung volumes. 2. Patchy right greater than left parahilar and left lung base opacities appear slightly worsened, compatible with pneumonia, aspiration and/or atelectasis. Chest radiograph follow-up advised. Electronically Signed   By: Ilona Sorrel M.D.   On: 08/04/2019 10:45     ASSESSMENT AND PLAN:   Acute hypoxic and hypercapnic respiratory failure due to aspiration pneumonia- currently on 6L O2 by nasal cannula today -Continue unasyn -Wean O2 as able -Continue duonebs  Dysphagia- having severe aspiration episodes -EGD 10/22 with benign-appearing for esophageal stenosis that was dilated -NPO  -Patient will have PEG tube placed by IR on Monday  Small right lung pulmonary emboli- seen on CT scan Intensivist documented Lovenox to be discontinued after this morning dose  due to plans for PEG tube placement tomorrow.  Chronic iron deficiency anemia- hemoglobin is lower than baseline. -s/p IV iron x1 on 10/18 -Continue to monitor  DTs with a history of EtOH abuse -Precedex gtt  Elevated LFTs- likely due to alcohol use.  AST/ALT improved. -Right upper quadrant  ultrasound showed hepatic steatosis -Monitor  Tobacco use -Nicotine patch ordered  Left hand cellulitis  Felt to be probably due to bug bite.   Patient seen by orthopedic physician this morning.  No evidence of deeper infection that would require I&D at this time.  DVT proph; Lovenox   CODE STATUS: full  TOTAL TIME TAKING CARE OF THIS PATIENT: 34 minutes.   POSSIBLE D/C unknown, DEPENDING ON CLINICAL CONDITION.   Staton Markey M.D on 08/05/2019 at 1:47 PM  Between 7am to 6pm - Pager - [phone]  After 6pm go to [email or web address] - password [email or web address] [place] [facility]  Office  [phone]  CC: Primary care physician; Patient, [name] Per  Note: This dictation was prepared with Dragon dictation along with smaller phrase technology. Any transcriptional errors that result from this process are unintentional.

## 2019-08-05 NOTE — Progress Notes (Addendum)
Pharmacy Electrolyte Monitoring Consult:  63 y.o. male admitted on 07/28/2019 with Cough and Chest Pain related to an acute PE.  Because of his alcohol use disorder he remains at a high risk for refeeding syndrome.    Labs:  Sodium (mmol/L)  Date Value  08/05/2019 139   Potassium (mmol/L)  Date Value  08/05/2019 3.7   Magnesium (mg/dL)  Date Value  08/05/2019 1.8   Phosphorus (mg/dL)  Date Value  08/04/2019 3.2   Calcium (mg/dL)  Date Value  08/05/2019 8.1 (L)   Albumin (g/dL)  Date Value  07/31/2019 2.7 (L)   Corrected Ca: 9.44 mg/dL  K 3.7  Plan:  -Patient has aspirated several times. Patient is unable to take any PO meds.  -Plan on having PEG tube on Monday. Patient will remain NPO until then.  -No additional potassium replacement needed at this time.  -Recheck electrolytes tomorrow with am labs  Pharmacy will continue to follow.  Rowland Lathe, [facility] 08/05/2019 3:21 PM

## 2019-08-05 NOTE — Progress Notes (Signed)
Patient's CIWA is 36. Patient was given 2mg  of ativan x2 with minimal effects noted. Patient is hallucinating about red bugs crawling on him and his bed. Patient is requesting staff cut inside his nose. Patient is demanding staff allow him to sort through the dirty laundry basket. Attempted to redirect patient to no avail. Precedex turned back on. Will continue to monitor and endorse.

## 2019-08-05 NOTE — Progress Notes (Signed)
CRITICAL CARE NOTE  CC  follow up pulmonary emboli, esophageal stricture and alcohol withdrawal   SUBJECTIVE Difficulty eating and swallowing has not.  Pt needs PEG tube placed. Prognosis is guarded High risk for aspiration Remains on precedex fro DT's    BP (!) 141/88    Pulse 64    Temp 99.1 F (37.3 C) (Axillary)    Resp 20    Ht 5\' 8"  (1.727 m)    Wt 73.8 kg    SpO2 94%    BMI 24.75 kg/m    I/O last 3 completed shifts: In: 5099.2 [I.V.:2123.8; IV Piggyback:2975.4] Out: 3250 [Urine:3250] Total I/O In: -  Out: 1150 [Urine:1150]  SpO2: 94 % O2 Flow Rate (L/min): 4 L/min FiO2 (%): 35 %   SIGNIFICANT EVENTS 10/17: Presented to the ED with SOB and was admitted to the hospital for pulmonary emboli. He was transferred to the floor and started on a heparin drip. CIWA protocol initiated.  10/18: Attempt to switch patient from heparin drip to eliquis but patient complains of nausea and unable to tolerate anything PO. Pt became irritated and had some mild shaking. CIWA protocol continued. Potassium of 2.8, replacement initiated.  10/19: Pt complaining of difficulty swallowing and vomiting. Placed on IV PPI and made NPO.  10/20: Transferred to ICU for rapid respirations and SpO2 of 93% despite being on nonrebreather. Placed on HFNC at 15L with solumedrol and duoneb treatments. Started on unasyn for aspiration pneumonia. CIWA protocol continued.  10/21: Remains on HFNC. CIWA protocol continued. Precedex infusions for DTs 10/22: DTs treated with precedex. Pt on 2L [place]. GI consulted for endoscopy. Pt underwent endoscopy, severe esophageal stricture found. GI recommended PEG tube placement.  10/23: Pt remains on 2L O2 and precedex drip for DTs. IR consulted, and PEG tube placement scheduled for Monday. Continue lovenox for anticoagulation. Last dose should be Sunday AM. 10/24 -Patient is in no distress, able to communicate without encouragement.   10/25 - withdrawal symptoms with aggitation  , patient on precedex now with improvement  REVIEW OF SYSTEMS - Minimal ROS due to patient's lethargy  Review of Systems  Constitutional: Negative for chills and fever.  Respiratory: Negative for shortness of breath.        Reports breathing has improved  Cardiovascular: Negative for chest pain.  Psychiatric/Behavioral: Positive for hallucinations (Visual) and substance abuse.       States he feels "disoriented"       PHYSICAL EXAMINATION:  GENERAL: sedated on precedex, lethargic  HEAD: Normocephalic, atraumatic.  EYES: Pupils equal, round, reactive to light.  No scleral icterus.  MOUTH: Lips are dry and chapped. Moist mucosal membrane. White plaques on tongue and roof of mouth.  NECK: Supple.  PULMONARY: CTA bilaterally  CARDIOVASCULAR: S1 and S2. Regular rate and rhythm. No murmurs, rubs, or gallops.  GASTROINTESTINAL: Soft, nontender, -distended. No masses. Hypoactive bowel sounds. No hepatosplenomegaly.  MUSCULOSKELETAL: Left hand swollen. PIV placed in left forearm. All other extremities are non-edematous.  NEUROLOGIC: A+Ox4. Lethargic due to sedation SKIN:Intact,warm,dry  MEDICATIONS: I have reviewed all medications and confirmed regimen as documented   CULTURE RESULTS   Recent Results (from the past 240 hour(s))  SARS CORONAVIRUS 2 (TAT 6-24 HRS) Nasopharyngeal Nasopharyngeal Swab     Status: None   Collection Time: 07/28/19  1:19 PM   Specimen: Nasopharyngeal Swab  Result Value Ref Range Status   SARS Coronavirus 2 NEGATIVE NEGATIVE Final    Comment: (NOTE) SARS-CoV-2 target nucleic acids are NOT DETECTED. The SARS-CoV-2  RNA is generally detectable in upper and lower respiratory specimens during the acute phase of infection. Negative results do not preclude SARS-CoV-2 infection, do not rule out co-infections with other pathogens, and should not be used as the sole basis for treatment or other patient management decisions. Negative results must be combined with  clinical observations, patient history, and epidemiological information. The expected result is Negative. Fact Sheet for Patients: SugarRoll.be Fact Sheet for Healthcare Providers: https://www.woods-mathews.com/ This test is not yet approved or cleared by the [place] FDA and  has been authorized for detection and/or diagnosis of SARS-CoV-2 by FDA under an Emergency Use Authorization (EUA). This EUA will remain  in effect (meaning this test can be used) for the duration of the COVID-19 declaration under Section 56 4(b)(1) of the Act, 21 U.S.C. section 360bbb-3(b)(1), unless the authorization is terminated or revoked sooner. Performed at [facility], [place] [address], [place], [place] [address]   MRSA PCR Screening     Status: None   Collection Time: 07/31/19  6:20 AM   Specimen: Nasopharyngeal  Result Value Ref Range Status   MRSA by PCR NEGATIVE NEGATIVE Final    Comment:        The GeneXpert MRSA Assay (FDA approved for NASAL specimens only), is one component of a comprehensive MRSA colonization surveillance program. It is not intended to diagnose MRSA infection nor to guide or monitor treatment for MRSA infections. Performed at [facility], [address] [place], [place] [address]   CULTURE, BLOOD (ROUTINE X 2) w Reflex to ID Panel     Status: None (Preliminary result)   Collection Time: 08/04/19 10:48 AM   Specimen: BLOOD  Result Value Ref Range Status   Specimen Description BLOOD Blood Culture adequate volume  Final   Special Requests   Final    BOTTLES DRAWN AEROBIC AND ANAEROBIC BLOOD RIGHT HAND   Culture   Final    NO GROWTH < 24 HOURS Performed at [facility], [address], [place], [place] [address]    Report Status PENDING  Incomplete  CULTURE, BLOOD (ROUTINE X 2) w Reflex to ID Panel     Status: None (Preliminary result)   Collection Time: 08/04/19 10:54 AM   Specimen: BLOOD    Result Value Ref Range Status   Specimen Description   Final    BLOOD Blood Culture results may not be optimal due to an excessive volume of blood received in culture bottles   Special Requests   Final    BOTTLES DRAWN AEROBIC AND ANAEROBIC LEFT ANTECUBITAL   Culture   Final    NO GROWTH < 24 HOURS Performed at [facility], [address] [place], [place] [address]    Report Status PENDING  Incomplete         ASSESSMENT AND PLAN SYNOPSIS   Severe ACUTE Hypoxic and Hypercapnic Respiratory Failure d/t aspiration pneumonia  -Continue 2L O2 via [place]  -Monitor RR, SpO2 and symptoms  -continue unasyn  -high risk for intubation  -high risk aspiration    Alcohol withdrawal syndrome   - folow [place] ETOH withdrawal protocol -Therapy with thiamine and MVI  -Sedated with precedex for DTs. Continue precedex drip  -Pt is lethargic, but A+Ox4.  -Pt continues to have visual hallucinations.  -Continue CIWA protocol   CARDIAC -ICU monitoring -Continue lovenox injections for anticoagulation. D/C after Sunday AM dose d/t PEG tube procedure on Monday  ID -continue IV abx as prescibed -follow up cultures  GI -GI consulted. PEG tube placement recommended due to severe esophageal stricture. Scheduled for Monday by IR -GI PROPHYLAXIS as indicated  NUTRITIONAL STATUS DIET--> NPO Constipation protocol as indicated -phos/mag/k - monitor for refeeeding syndrome  ENDO - will use ICU hypoglycemic\Hyperglycemia protocol if indicated   ELECTROLYTES -follow labs as needed -replace as needed -pharmacy consultation and following   DVT/GI PRX ordered TRANSFUSIONS AS NEEDED MONITOR FSBS ASSESS the need for LABS as needed   Critical Care Time devoted to patient care services described in this note is 32 minutes.   Overall, patient is critically ill, prognosis is guarded.      Ottie Glazier, M.D.  Pulmonary [facility] [place]

## 2019-08-06 ENCOUNTER — Encounter: Payer: Self-pay | Admitting: *Deleted

## 2019-08-06 ENCOUNTER — Other Ambulatory Visit: Payer: Self-pay

## 2019-08-06 ENCOUNTER — Inpatient Hospital Stay: Payer: Self-pay

## 2019-08-06 HISTORY — PX: IR GASTROSTOMY TUBE MOD SED: IMG625

## 2019-08-06 LAB — BASIC METABOLIC PANEL
Anion gap: 15 (ref 5–15)
BUN: <5 mg/dL — ABNORMAL LOW (ref 8–23)
CO2: 15 mmol/L — ABNORMAL LOW (ref 22–32)
Calcium: 8.5 mg/dL — ABNORMAL LOW (ref 8.9–10.3)
Chloride: 109 mmol/L (ref 98–111)
Creatinine, Ser: 0.4 mg/dL — ABNORMAL LOW (ref 0.61–1.24)
GFR calc Af Amer: >60 mL/min (ref >60)
GFR calc non Af Amer: >60 mL/min (ref >60)
Glucose, Bld: 117 mg/dL — ABNORMAL HIGH (ref 70–99)
Potassium: 3.5 mmol/L (ref 3.5–5.1)
Sodium: 139 mmol/L (ref 135–145)

## 2019-08-06 LAB — PROTIME-INR
INR: 1.2 (ref 0.8–1.2)
Prothrombin Time: 14.6 seconds (ref 11.4–15.2)

## 2019-08-06 LAB — MAGNESIUM: Magnesium: 1.9 mg/dL (ref 1.7–2.4)

## 2019-08-06 MED ORDER — MIDAZOLAM HCL 5 MG/5ML IJ SOLN
INTRAMUSCULAR | Status: AC
  Filled 2019-08-06: qty 5

## 2019-08-06 MED ORDER — IPRATROPIUM-ALBUTEROL 0.5-2.5 (3) MG/3ML IN SOLN
RESPIRATORY_TRACT | Status: AC
  Filled 2019-08-06: qty 3

## 2019-08-06 MED ORDER — MORPHINE SULFATE (PF) 2 MG/ML IV SOLN
2.0000 mg | INTRAVENOUS | Status: DC | PRN
  Administered 2019-08-06 – 2019-08-17 (×18): 2 mg via INTRAVENOUS
  Filled 2019-08-06 (×19): qty 1

## 2019-08-06 MED ORDER — FENTANYL CITRATE (PF) 100 MCG/2ML IJ SOLN
INTRAMUSCULAR | Status: AC | PRN
  Administered 2019-08-06: 50 ug via INTRAVENOUS
  Administered 2019-08-06 (×2): 25 ug via INTRAVENOUS

## 2019-08-06 MED ORDER — CEFAZOLIN SODIUM-DEXTROSE 2-4 GM/100ML-% IV SOLN
2.0000 g | Freq: Once | INTRAVENOUS | Status: AC
  Administered 2019-08-06: 2 g via INTRAVENOUS
  Filled 2019-08-06: qty 100

## 2019-08-06 MED ORDER — CEFAZOLIN SODIUM-DEXTROSE 2-4 GM/100ML-% IV SOLN
INTRAVENOUS | Status: AC
  Filled 2019-08-06: qty 100

## 2019-08-06 MED ORDER — FENTANYL CITRATE (PF) 100 MCG/2ML IJ SOLN
INTRAMUSCULAR | Status: AC
  Filled 2019-08-06: qty 2

## 2019-08-06 MED ORDER — POTASSIUM CHLORIDE 10 MEQ/100ML IV SOLN
10.0000 meq | INTRAVENOUS | Status: AC
  Administered 2019-08-06 (×2): 10 meq via INTRAVENOUS
  Filled 2019-08-06 (×2): qty 100

## 2019-08-06 MED ORDER — MIDAZOLAM HCL 2 MG/2ML IJ SOLN
INTRAMUSCULAR | Status: AC | PRN
  Administered 2019-08-06 (×3): 1 mg via INTRAVENOUS

## 2019-08-06 MED ORDER — MAGNESIUM SULFATE 2 GM/50ML IV SOLN
2.0000 g | Freq: Once | INTRAVENOUS | Status: AC
  Administered 2019-08-06: 2 g via INTRAVENOUS

## 2019-08-06 MED ORDER — POTASSIUM CHLORIDE 10 MEQ/100ML IV SOLN
10.0000 meq | INTRAVENOUS | Status: DC
  Filled 2019-08-06 (×2): qty 100

## 2019-08-06 MED ORDER — IODIXANOL 320 MG/ML IV SOLN
50.0000 mL | Freq: Once | INTRAVENOUS | Status: AC | PRN
  Administered 2019-08-06: 10 mL via INTRAVENOUS

## 2019-08-06 MED ORDER — MAGNESIUM SULFATE 2 GM/50ML IV SOLN
2.0000 g | Freq: Once | INTRAVENOUS | Status: DC
  Filled 2019-08-06: qty 50

## 2019-08-06 NOTE — Progress Notes (Signed)
Dr. Kathlene Cote Radiologist notified via Radiology RN Forestine Chute. Per Jocelyn Lamer RN Dr. Kathlene Cote notified on Pt current VS and overall status. No orders at this time. Md ok with transfer back to ICU. Report called to [facility].

## 2019-08-06 NOTE — Progress Notes (Signed)
Pharmacy Electrolyte Monitoring Consult:  63 y.o. male admitted on 07/28/2019 with Cough and Chest Pain related to an acute PE.  Because of his alcohol use disorder he remains at a high risk for refeeding syndrome.    Labs:  Sodium (mmol/L)  Date Value  08/06/2019 139   Potassium (mmol/L)  Date Value  08/06/2019 3.5   Magnesium (mg/dL)  Date Value  08/06/2019 1.9   Phosphorus (mg/dL)  Date Value  08/04/2019 3.2   Calcium (mg/dL)  Date Value  08/06/2019 8.5 (L)   Albumin (g/dL)  Date Value  07/31/2019 2.7 (L)   Corrected Ca: 9.54 mg/dL  Plan:   Patient has aspirated several times. Patient is unable to take any PO meds.   Plan on having PEG tube today. Patient will remain NPO until then.   Replace potassium with 20 mEq IV KCl (goal 4 - 5.1 mmol/L)  Replace magnesium with 2 grams IV magnesium sulfate (goal 2 - 2.4 mg/dL)  Recheck electrolytes tomorrow with am labs  Pharmacy will continue to follow.  Dallie Piles, [facility] 08/06/2019 7:18 AM

## 2019-08-06 NOTE — Procedures (Signed)
Interventional Radiology Procedure Note  Procedure: Percutaneous gastrostomy tube placement  Complications: None  Estimated Blood Loss: < 10 mL  Findings: At level of distal esophageal stricture, initially difficult to pass 5 Fr catheter through stricture to insufflate stomach, but eventually able to do so over guidewire under fluoroscopy.  Two Cope gastric T-tacks deployed in gastric lumen and brought to tension at skin. Via third puncture of stomach, a 16 Fr balloon retention gastrostomy tube was placed through peel-away sheath. Tube tip is in mid body of stomach. OK to use in 24 hours.  Venetia Night. Kathlene Cote, M.D Pager:  [phone]

## 2019-08-06 NOTE — Progress Notes (Signed)
Ch visited pt during progressive rounds. Pt is a 63 y.o. with PE and suffers from ETOH dependency that will be having a PEG tube procedure today. Pt shared that he "made a bad decision" when he recently received a DUI after drinking with friends. The pt shares that this is his second DUI but has not been a heavy drinker all of his life. Pt family has a hx of alcoholics and he reports that his drinking began at the age of 53. Ch perceived that the pts recent drinking was induced by his existential circumstances that have 'put him on his back' as the pt described. Pt recently went through a divorce and loss his best friend to cancer. Ch was not sure of how much support pt has currently especially during the times of major life transitions that he has undergone. 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 Ch became aware of how the pt has become increasingly over burdened with both health and major life events leading to his increase in ETOH use. Ch informed staff of pt's request to have a mouth swab and shared that f/u care could be provided post-op.    08/06/19 1000  Clinical Encounter Type  Visited With Patient;Health care provider  Visit Type Psychological support;Spiritual support;Social support;Critical Care  Spiritual Encounters  Spiritual Needs Emotional;Grief support  Stress Factors  Patient Stress Factors Health changes;Family relationships;Exhausted;Loss;Loss of control;Major life changes;Lack of caregivers  Family Stress Factors None identified

## 2019-08-06 NOTE — Progress Notes (Signed)
Pt remains on precedex drip for agitation, DT's.  He continues to have hallucinations.  He is seeing reg bugs flying around.  He is able to be redirected.  Ativan given x1 for anxiety and hallucinations at pt's request.  REmains NPO.  Full CHG bath given.  Telesitter in place.

## 2019-08-06 NOTE — Progress Notes (Signed)
Patient present in SPR unit from ICU. Per ICU RN report Pt confused. At this time Pt is lethargic but responds to voice. Once awake Pt oriented to name date place and sittuation. Able to clearly state reason for procedure and reason for hospitalization. Pt does have transient confusion. Patient states "Who made choice to talk to my sister about my health?"  Procedure and rationale discussed with Patient per Dr. Kathlene Cote. Verbal consent obtained by Patient and telephone consent obtained by Patients sister per Dr. Kathlene Cote.

## 2019-08-06 NOTE — Progress Notes (Signed)
  Subjective: Left dorsal hand ulceration Patient reports pain as mild.   Dr. Rudene Christians unroofed the scab yesterday to create area for drainage. Denies any tick bite or history of bug bite. Reports pain has improved to the left hand No significant pain with finger ROM.  Objective: Vital signs in last 24 hours: Temp:  [97.5 F (36.4 C)-98.9 F (37.2 C)] 97.5 F (36.4 C) (10/26 0800) Pulse Rate:  [59-66] 59 (10/26 1100) Resp:  [17-22] 18 (10/26 1100) BP: (116-164)/(82-99) 116/83 (10/26 1000) SpO2:  [93 %-99 %] 97 % (10/26 1100)  Intake/Output from previous day:  Intake/Output Summary (Last 24 hours) at 08/06/2019 1247 Last data filed at 08/06/2019 1122 Gross per 24 hour  Intake 2845.25 ml  Output 3525 ml  Net -679.75 ml    Intake/Output this shift: Total I/O In: 2845.3 [I.V.:2257.7; IV Piggyback:587.5] Out: 825 [Urine:825]  Labs: Recent Labs    08/05/19 0412  HGB 8.7*   Recent Labs    08/05/19 0412  WBC 9.0  RBC 3.67*  HCT 30.3*  PLT 289   Recent Labs    08/05/19 0412 08/05/19 1457 08/06/19 0503  NA 139  --  139  K 3.3* 3.7 3.5  CL 107  --  109  CO2 18*  --  15*  BUN 6*  --  <5*  CREATININE 0.54*  --  0.40*  GLUCOSE 88  --  117*  CALCIUM 8.1*  --  8.5*   Recent Labs    08/06/19 0503  INR 1.2     EXAM General - Patient is alert and appropriate on exam. Extremity - 2 mm area of ulceration to the dorsal aspect of the left hand, between the 5th and 4th metacarpals.  No fluctuance with palpation, mild pain.  Able to flex and extend the 4th and 5th digits with minimal pain.  No purulent drainage or significant erythema noted, ecchymosis present to the hand.  Minimal bloody drainage to the dorsal aspect of the hand.  New dressing applied today.  Intact to light touch to the hand, cap refill intact to each digit.  Past Medical History:  Diagnosis Date  . Alcoholism with psychosis with complication, with hallucinations ([place])   . ED (erectile dysfunction)    . Fatigue   . History of pneumonia   . Hypercholesteremia   . Hypertension   . Lymphoma ([place]) 2008   non hodgkins lymphoma  . Rectal bleeding   . Substance abuse ([place])    ethanol  . 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(HCC)     Assessment/Plan: 4 Days Post-Op Procedure(s) (LRB): ESOPHAGOGASTRODUODENOSCOPY (EGD) WITH PROPOFOL (N/A) Principal Problem:   Alcohol-induced mood disorder (HCC) Active Problems:   Alcohol dependence with uncomplicated intoxication ([place])   Pulmonary emboli (HCC)   Nausea & vomiting   Malnutrition of moderate degree   Problems with swallowing and mastication   Stricture and stenosis of esophagus  Estimated body mass index is 24.75 kg/m as calculated from the following:   Height as of this encounter: 5\' 8"  (1.727 m).   Weight as of this encounter: 73.8 kg.  Swelling and erythema improved this AM, moderate ecchymosis. Continue with IV antibiotics at this time.  No fluid collection noted that needs I&D. Will continue to perform skin checks. Activity as tolerated to the left hand.  Raquel James, PA-C [facility] Orthopaedic Surgery 08/06/2019, 12:47 PM

## 2019-08-06 NOTE — Progress Notes (Signed)
Pharmacy Antibiotic Note  George Wade. is a 63 y.o. male admitted on 07/28/2019 with aspiration pneumonia.  Patient also presents with possible infection of the left hand. Plan on a stop date of 10 day therapy for Unasyn. Pharmacy has been consulted for Unasyn dosing.  Plan: -Continue Unasyn 3g IV q6h, day 7 -Monitor renal function and s/sx of infection and adverse events  Height: 5\' 8"  (172.7 cm) Weight: 162 lb 12.8 oz (73.8 kg) IBW/kg (Calculated) : 68.4  Temp (24hrs), Avg:98.3 F (36.8 C), Min:97.5 F (36.4 C), Max:98.9 F (37.2 C)  Recent Labs  Lab 07/31/19 0352 08/01/19 0434 08/02/19 0521 08/03/19 0539 08/04/19 0438 08/05/19 0412 08/06/19 0503  WBC 23.2* 11.2*  --  8.8  --  9.0  --   CREATININE 0.67 0.67 0.54* 0.59* 0.50* 0.54* 0.40*    Estimated Creatinine Clearance: 92.6 mL/min (A) (by C-G formula based on SCr of 0.4 mg/dL (L)).    Allergies  Allergen Reactions  . Sulfa Antibiotics Nausea Only   Antimicrobials this admission: Unasyn 10/20 >>10/29  Microbiology Results: 10/24 BCx: no growth x2 days 10/20 MRSA PCR: negative 10/17 SARS-CoV-2: negative  Thank you for allowing pharmacy to be a part of this patient's care.  Sallye Lat, PharmD Candidate 08/06/2019 10:47 AM

## 2019-08-06 NOTE — Progress Notes (Signed)
[place] at [place] NAME: George Wade    MR#:  [id number]  DATE OF BIRTH:  27-Jun-1956  SUBJECTIVE:   Patient more awake and alert and oriented this morning.  Being weaned off Precedex . Scheduled for PEG tube placement by interventional radiologist today.   REVIEW OF SYSTEMS:   RESPIRATORY: No cough, shortness of breath, wheezing or hemoptysis.  CARDIOVASCULAR: No chest pain, orthopnea, edema.  GASTROINTESTINAL: No nausea, vomiting, diarrhea or abdominal pain.  GENITOURINARY: No dysuria, hematuria.  ENDOCRINE: No polyuria, nocturia,  HEMATOLOGY: No anemia, easy bruising or bleeding SKIN: No rash or lesion. MUSCULOSKELETAL: No joint pain or arthritis.  Pain in the left hand.  NEUROLOGIC: No tingling, numbness, weakness.  Memory issue this morning. Psychiatric/Behavioral: Positive for hallucinations ; visual  DRUG ALLERGIES:   Allergies  Allergen Reactions  . Sulfa Antibiotics Nausea Only    VITALS:  Blood pressure (!) 97/57, pulse 62, temperature (!) 97.5 F (36.4 C), temperature source Oral, resp. rate 20, height 5\' 8"  (1.727 m), weight 73.8 kg, SpO2 98 %.  PHYSICAL EXAMINATION:  Constitutional: Appears well-developed and well-nourished. No distress. HENT: Normocephalic, atraumatic, Oropharynx is clear and moist.  Eyes: Conjunctivae and EOM are normal. PERRLA, no scleral icterus.  Neck: Normal ROM. Neck supple. No JVD. No tracheal deviation. CVS: RRR, S1/S2 +, no murmurs, no gallops, no carotid bruit.  Pulmonary: + Rhonchi present throughout the entire right lung. +diminished breath sounds in the lung bases bilaterally. Rawl[place] in place. Abdominal: Soft. BS +,  no distension, +epigastric abdominal pain, no rebound or guarding.  Musculoskeletal: Normal range of motion. No edema and no tenderness.  Neuro: Alert. CN 2-12 grossly intact. No focal deficits. Skin: Skin is warm and dry. No rash noted. Psychiatric: Normal mood and affect.    LABORATORY PANEL:   CBC Recent Labs  Lab 08/05/19 0412  WBC 9.0  HGB 8.7*  HCT 30.3*  PLT 289   ------------------------------------------------------------------------------------------------------------------  Chemistries  Recent Labs  Lab 08/06/19 0503  NA 139  K 3.5  CL 109  CO2 15*  GLUCOSE 117*  BUN <5*  CREATININE 0.40*  CALCIUM 8.5*  MG 1.9   ------------------------------------------------------------------------------------------------------------------  Cardiac Enzymes No results for input(s): TROPONINI in the last 168 hours. ------------------------------------------------------------------------------------------------------------------  RADIOLOGY:  No results found.   ASSESSMENT AND PLAN:   Acute hypoxic and hypercapnic respiratory failure due to aspiration pneumonia-  Noted to have been weaned off oxygen with documented oxygen saturation of 98% on room air this morning.   -Continue unasyn -Continue duonebs  Dysphagia- having severe aspiration episodes -EGD 10/22 with benign-appearing for esophageal stenosis that was dilated -NPO  -Patient will have PEG tube placed by IR on today  Small right lung pulmonary emboli- seen on CT scan Intensivist documented Lovenox to be discontinued after this morning dose due to plans for PEG tube placement tomorrow.  Chronic iron deficiency anemia- hemoglobin is lower than baseline. -s/p IV iron x1 on 10/18 -Continue to monitor  DTs with a history of EtOH abuse -Precedex drip being weaned off  Elevated LFTs- likely due to alcohol use.  AST/ALT improved. -Right upper quadrant ultrasound showed hepatic steatosis -Monitor  Tobacco use -Nicotine patch ordered  Left hand cellulitis  Felt to be probably due to bug bite.   Patient seen by orthopedic physician this morning.  No evidence of deeper infection that would require I&D at this time.  DVT proph; Lovenox to be resumed after PEG tube  placement  CODE STATUS: full  TOTAL TIME TAKING CARE OF THIS PATIENT: 34 minutes.   POSSIBLE D/C unknown, DEPENDING ON CLINICAL CONDITION.   George Wade M.D on 08/06/2019 at 2:15 PM  Between 7am to 6pm - Pager - [phone]  After 6pm go to [email or web address] - password [email or web address] [facility]  Office  [phone]  CC: Primary care physician; Patient, [name] Per  Note: This dictation was prepared with Dragon dictation along with smaller phrase technology. Any transcriptional errors that result from this process are unintentional.

## 2019-08-06 NOTE — Progress Notes (Addendum)
CRITICAL CARE NOTE  CC  follow up pulmonary emboli, esophageal stricture and alcohol withdrawal   SUBJECTIVE Patient remains critically ill Prognosis is guarded Strict NPO due to severe esophageal stricture - needs PEG tube placed  High risk for aspiration  Remains on precedex for DTs   BP (!) 161/96 (BP Location: Left Arm)   Pulse 60   Temp (!) 97.5 F (36.4 C) (Oral)   Resp 20   Ht 5\' 8"  (1.727 m)   Wt 73.8 kg   SpO2 94%   BMI 24.75 kg/m    I/O last 3 completed shifts: In: 1569.6 [I.V.:669.6; IV Piggyback:900] Out: 4350 [Urine:4350] No intake/output data recorded.  SpO2: 94 % O2 Flow Rate (L/min): 4 L/min FiO2 (%): 35 %   SIGNIFICANT EVENTS 10/17: Presented to the ED with SOB and was admitted to the hospital for pulmonary emboli. He was transferred to the floor and started on a heparin drip. CIWA protocol initiated.  10/18: Attempt to switch patient from heparin drip to eliquis but patient complains of nausea and unable to tolerate anything PO. Pt became irritated and had some mild shaking. CIWA protocol continued. Potassium of 2.8, replacement initiated.  10/19: Pt complaining of difficulty swallowing and vomiting. Placed on IV PPI and made NPO.  10/20: Transferred to ICU for rapid respirations and SpO2 of 93% despite being on nonrebreather. Placed on HFNC at 15L with solumedrol and duoneb treatments. Started on unasyn for aspiration pneumonia. CIWA protocol continued.  10/21: Remains on HFNC. CIWA protocol continued. Precedex infusions for DTs 10/22: DTs treated with precedex. Pt on 2L [place]. GI consulted for endoscopy. Pt underwent endoscopy, severe esophageal stricture found. GI recommended PEG tube placement.  10/23: Pt remains on 2L O2 and precedex drip for DTs. IR consulted, and PEG tube placement scheduled for Monday. Continue lovenox for anticoagulation. Last dose should be Sunday AM. 10/24: Patient is in no distress, able to communicate without encouragement.    10/25: Withdrawal symptoms with aggitation , patient on precedex now with improvement 10/26: Pt breathing comfortably on room air. He is still having visual hallucinations, and continues on precedex drip. Scheduled to have PEG tube placed today by IR.   REVIEW OF SYSTEMS - Minimal ROS due to patient's lethargy  Review of Systems  Constitutional: Negative for chills and fever.  Respiratory: Negative for shortness of breath.   Cardiovascular: Positive for chest pain (dull, aching pain in epigastric region). Negative for palpitations.  Gastrointestinal: Positive for nausea and vomiting.  Musculoskeletal:       Pain in left hand  Psychiatric/Behavioral: Positive for hallucinations (Visual - sees red flying bugs, black ants).       PHYSICAL EXAMINATION:  GENERAL:critically ill appearing, sedated on precedex, lethargic  HEAD: Normocephalic, atraumatic.  EYES: Pupils equal, round, reactive to light.  No scleral icterus.  MOUTH: Moist mucosal membrane  NECK: Supple.  PULMONARY: CTA bilaterally  CARDIOVASCULAR: S1 and S2. Regular rate and rhythm. No murmurs, rubs, or gallops.  GASTROINTESTINAL: Small amount of tan emesis visualized on pt gown. Soft, nontender, -distended. No masses. Hypoactive bowel sounds. No hepatosplenomegaly.  MUSCULOSKELETAL: Left hand swollen. PIV placed in left forearm. All other extremities non-edematous.   NEUROLOGIC:intermittant  Lethargic due to sedation. Sensory and motor function intact.  SKIN:intact,warm,dry  MEDICATIONS: I have reviewed all medications and confirmed regimen as documented   CULTURE RESULTS   Recent Results (from the past 240 hour(s))  SARS CORONAVIRUS 2 (TAT 6-24 HRS) Nasopharyngeal Nasopharyngeal Swab     Status:  None   Collection Time: 07/28/19  1:19 PM   Specimen: Nasopharyngeal Swab  Result Value Ref Range Status   SARS Coronavirus 2 NEGATIVE NEGATIVE Final    Comment: (NOTE) SARS-CoV-2 target nucleic acids are NOT DETECTED. The  SARS-CoV-2 RNA is generally detectable in upper and lower respiratory specimens during the acute phase of infection. Negative results do not preclude SARS-CoV-2 infection, do not rule out co-infections with other pathogens, and should not be used as the sole basis for treatment or other patient management decisions. Negative results must be combined with clinical observations, patient history, and epidemiological information. The expected result is Negative. Fact Sheet for Patients: SugarRoll.be Fact Sheet for Healthcare Providers: https://www.woods-mathews.com/ This test is not yet approved or cleared by the [place] FDA and  has been authorized for detection and/or diagnosis of SARS-CoV-2 by FDA under an Emergency Use Authorization (EUA). This EUA will remain  in effect (meaning this test can be used) for the duration of the COVID-19 declaration under Section 56 4(b)(1) of the Act, 21 U.S.C. section 360bbb-3(b)(1), unless the authorization is terminated or revoked sooner. Performed at [facility], [place] [address] [place], [place] [address]   MRSA PCR Screening     Status: None   Collection Time: 07/31/19  6:20 AM   Specimen: Nasopharyngeal  Result Value Ref Range Status   MRSA by PCR NEGATIVE NEGATIVE Final    Comment:        The GeneXpert MRSA Assay (FDA approved for NASAL specimens only), is one component of a comprehensive MRSA colonization surveillance program. It is not intended to diagnose MRSA infection nor to guide or monitor treatment for MRSA infections. Performed at [facility], [address] [place], [place] [address]   CULTURE, BLOOD (ROUTINE X 2) w Reflex to ID Panel     Status: None (Preliminary result)   Collection Time: 08/04/19 10:48 AM   Specimen: BLOOD  Result Value Ref Range Status   Specimen Description BLOOD Blood Culture adequate volume  Final   Special Requests   Final    BOTTLES  DRAWN AEROBIC AND ANAEROBIC BLOOD RIGHT HAND   Culture   Final    NO GROWTH 2 DAYS Performed at [facility], [address] [place], [place] [address]    Report Status PENDING  Incomplete  CULTURE, BLOOD (ROUTINE X 2) w Reflex to ID Panel     Status: None (Preliminary result)   Collection Time: 08/04/19 10:54 AM   Specimen: BLOOD  Result Value Ref Range Status   Specimen Description   Final    BLOOD Blood Culture results may not be optimal due to an excessive volume of blood received in culture bottles   Special Requests   Final    BOTTLES DRAWN AEROBIC AND ANAEROBIC LEFT ANTECUBITAL   Culture   Final    NO GROWTH 2 DAYS Performed at [facility], [address] [place], [place] [address]    Report Status PENDING  Incomplete         Indwelling Urinary Catheter continued, requirement due to   Reason to continue Indwelling Urinary Catheter strict Intake/Output monitoring for hemodynamic instability      ASSESSMENT AND PLAN SYNOPSIS  Severe ACUTE Hypoxic and Hypercapnic Respiratory Failure d/t aspiration pneumonia  -Patient is breathing comfortably on room air -Monitor RR, SpO2 and symptoms  -continue unasyn  -high risk for intubation  -high risk aspiration    Alcohol Withdrawal  -Therapy with thiamine and MVI  -  Sedated with precedex for DTs. Temporarily hold precedex for PEG tube procedure. Continue ativan pushes prn. Restart precedex after procedure -Pt is lethargic, intermittently confused  Pt continues to have visual hallucinations.  -Continue CIWA protocol   CARDIAC -ICU monitoring -Lovenox injections temporarily held for PEG tube procedure  ID -Cellulitis of the left hand.  -Monitor and reassess to determine if current regimen of unasyn can clear infection -continue IV abx as prescibed -follow up cultures  GI -GI consulted. PEG tube placement recommended due to severe esophageal stricture. Scheduled for later today by IR  -GI  PROPHYLAXIS as indicated  NUTRITIONAL STATUS -DIET--> NPO -Constipation protocol as indicated  ENDO - will use ICU hypoglycemic\Hyperglycemia protocol if indicated   ELECTROLYTES -follow labs as needed - monitor potassium and magnesium  -replace as needed -pharmacy consultation and following   DVT/GI PRX ordered TRANSFUSIONS AS NEEDED MONITOR FSBS ASSESS the need for LABS as needed   Critical Care Time devoted to patient care services described in this note is 31 minutes.   Overall, patient is critically ill, prognosis is guarded.   Corrin Parker, M.D.  [facility]  Medical Director [place] Director [facility] Cardio-Pulmonary Department

## 2019-08-07 LAB — CBC
HCT: 33.8 % — ABNORMAL LOW (ref 39.0–52.0)
Hemoglobin: 9.7 g/dL — ABNORMAL LOW (ref 13.0–17.0)
MCH: 24 pg — ABNORMAL LOW (ref 26.0–34.0)
MCHC: 28.7 g/dL — ABNORMAL LOW (ref 30.0–36.0)
MCV: 83.7 fL (ref 80.0–100.0)
Platelets: 287 10*3/uL (ref 150–400)
RBC: 4.04 MIL/uL — ABNORMAL LOW (ref 4.22–5.81)
RDW: 25.8 % — ABNORMAL HIGH (ref 11.5–15.5)
WBC: 8.6 10*3/uL (ref 4.0–10.5)
nRBC: 0 % (ref 0.0–0.2)

## 2019-08-07 LAB — RENAL FUNCTION PANEL
Albumin: 2.3 g/dL — ABNORMAL LOW (ref 3.5–5.0)
Anion gap: 10 (ref 5–15)
BUN: <5 mg/dL — ABNORMAL LOW (ref 8–23)
CO2: 17 mmol/L — ABNORMAL LOW (ref 22–32)
Calcium: 8.2 mg/dL — ABNORMAL LOW (ref 8.9–10.3)
Chloride: 112 mmol/L — ABNORMAL HIGH (ref 98–111)
Creatinine, Ser: 0.48 mg/dL — ABNORMAL LOW (ref 0.61–1.24)
GFR calc Af Amer: >60 mL/min (ref >60)
GFR calc non Af Amer: >60 mL/min (ref >60)
Glucose, Bld: 97 mg/dL (ref 70–99)
Phosphorus: 3.4 mg/dL (ref 2.5–4.6)
Potassium: 3.4 mmol/L — ABNORMAL LOW (ref 3.5–5.1)
Sodium: 139 mmol/L (ref 135–145)

## 2019-08-07 LAB — MAGNESIUM: Magnesium: 2 mg/dL (ref 1.7–2.4)

## 2019-08-07 MED ORDER — OSMOLITE 1.5 CAL PO LIQD
1000.0000 mL | ORAL | Status: DC
  Administered 2019-08-07: 1000 mL

## 2019-08-07 MED ORDER — POTASSIUM CHLORIDE 10 MEQ/100ML IV SOLN
10.0000 meq | INTRAVENOUS | Status: AC
  Administered 2019-08-07 (×2): 10 meq via INTRAVENOUS
  Filled 2019-08-07 (×2): qty 100

## 2019-08-07 MED ORDER — VITAMIN B-1 100 MG PO TABS
100.0000 mg | ORAL_TABLET | Freq: Every day | ORAL | Status: DC
  Administered 2019-08-07 – 2019-08-10 (×4): 100 mg
  Filled 2019-08-07 (×4): qty 1

## 2019-08-07 MED ORDER — THIAMINE HCL 100 MG/ML IJ SOLN
Freq: Every day | INTRAVENOUS | Status: DC
  Filled 2019-08-07: qty 1000

## 2019-08-07 MED ORDER — DIAZEPAM 5 MG PO TABS
5.0000 mg | ORAL_TABLET | Freq: Four times a day (QID) | ORAL | Status: DC
  Administered 2019-08-07 – 2019-08-10 (×13): 5 mg via ORAL
  Filled 2019-08-07 (×13): qty 1

## 2019-08-07 MED ORDER — PRO-STAT SUGAR FREE PO LIQD
30.0000 mL | Freq: Every day | ORAL | Status: DC
  Administered 2019-08-08 – 2019-08-23 (×8): 30 mL

## 2019-08-07 MED ORDER — IPRATROPIUM-ALBUTEROL 0.5-2.5 (3) MG/3ML IN SOLN
3.0000 mL | Freq: Four times a day (QID) | RESPIRATORY_TRACT | Status: DC
  Administered 2019-08-07 – 2019-08-08 (×2): 3 mL via RESPIRATORY_TRACT
  Filled 2019-08-07 (×3): qty 3

## 2019-08-07 MED ORDER — ENOXAPARIN SODIUM 80 MG/0.8ML ~~LOC~~ SOLN
75.0000 mg | Freq: Two times a day (BID) | SUBCUTANEOUS | Status: DC
  Administered 2019-08-07 – 2019-08-09 (×5): 75 mg via SUBCUTANEOUS
  Filled 2019-08-07 (×5): qty 0.8

## 2019-08-07 MED ORDER — POTASSIUM CHLORIDE 20 MEQ PO PACK
40.0000 meq | PACK | Freq: Once | ORAL | Status: AC
  Administered 2019-08-07: 40 meq
  Filled 2019-08-07: qty 2

## 2019-08-07 MED ORDER — FOLIC ACID 1 MG PO TABS
1.0000 mg | ORAL_TABLET | Freq: Every day | ORAL | Status: DC
  Administered 2019-08-07 – 2019-08-10 (×4): 1 mg
  Filled 2019-08-07 (×4): qty 1

## 2019-08-07 MED ORDER — FREE WATER
150.0000 mL | Status: DC
  Administered 2019-08-07 – 2019-08-10 (×17): 150 mL

## 2019-08-07 NOTE — Progress Notes (Addendum)
CRITICAL CARE NOTE  CC  follow up pulmonary emboli, esophageal stricture and alcohol withdrawal   SUBJECTIVE Patient remains critically ill Prognosis is guarded Strict NPO due to severe esophageal stricture - PEG tube placed yesterday  High risk for aspiration  Remains on precedex for DTs  BP 99/85   Pulse 67   Temp 98.7 F (37.1 C) (Oral)   Resp 18   Ht 5\' 8"  (1.727 m)   Wt 73.8 kg   SpO2 96%   BMI 24.74 kg/m    I/O last 3 completed shifts: In: 3516.3 [I.V.:2409.5; IV Piggyback:1106.8] Out: 2825 [Urine:2825] Total I/O In: 1609.3 [I.V.:1169.6; IV Piggyback:439.7] Out: 550 [Urine:550]  SpO2: 96 % O2 Flow Rate (L/min): 3 L/min FiO2 (%): (!) 2 %   SIGNIFICANT EVENTS 10/17: Presented to the ED with SOB and was admitted to the hospital for pulmonary emboli. He was transferred to the floor and started on a heparin drip. CIWA protocol initiated.  10/18: Attempt to switch patient from heparin drip to eliquis but patient complains of nausea and unable to tolerate anything PO. Pt became irritated and had some mild shaking. CIWA protocol continued. Potassium of 2.8, replacement initiated.  10/19: Pt complaining of difficulty swallowing and vomiting. Placed on IV PPI and made NPO.  10/20: Transferred to ICU for rapid respirations and SpO2 of 93% despite being on nonrebreather. Placed on HFNC at 15L with solumedrol and duoneb treatments. Started on unasyn for aspiration pneumonia. CIWA protocol continued.  10/21: Remains on HFNC. CIWA protocol continued. Precedex infusions for DTs 10/22: DTs treated with precedex. Pt on 2L [place]. GI consulted for endoscopy. Pt underwent endoscopy, severe esophageal stricture found. GI recommended PEG tube placement.  10/23: Pt remains on 2L O2 and precedex drip for DTs. IR consulted, and PEG tube placement scheduled for Monday. Continue lovenox for anticoagulation. Last dose should be Sunday AM. 10/24: Patient is in no distress, able to communicate  without encouragement. 10/25: Withdrawal symptoms with aggitation , patient on precedex now with improvement 10/26: Pt breathing comfortably on room air. He is still having visual hallucinations, and continues on precedex drip. Scheduled to have PEG tube placed today by IR.  10/27: Pt breathing comfortably on room air. Still having visual hallucinations, but is more alert than days before. PEG tube was placed by IR yesterday. Pt tolerated procedure well and can start tube feeds later today.   REVIEW OF SYSTEMS Review of Systems  Constitutional: Negative for chills and fever.  HENT: Negative for sore throat.   Eyes: Negative for blurred vision.  Respiratory: Negative for shortness of breath.   Cardiovascular: Negative for chest pain.  Gastrointestinal: Positive for abdominal pain (Pain around PEG tube insertion site) and vomiting (reports some vomiting).  Musculoskeletal:       Left hand somewhat painful. Reports recent trauma from slamming it in car door  Psychiatric/Behavioral: Positive for hallucinations (visual - sees red flying bugs).      PHYSICAL EXAMINATION:  GENERAL:critically ill appearing, less lethargic and more alert today  HEAD: Normocephalic, atraumatic.  EYES: Pupils equal, round, reactive to light.  No scleral icterus.  MOUTH: Moist mucosal membrane  NECK: Supple.  PULMONARY: CTA bilaterally  CARDIOVASCULAR: S1 and S2. Regular rate and rhythm. No murmurs, rubs, or gallops.  GASTROINTESTINAL:Soft, nontender, -distended. No masses. Hypoactive bowel sounds. No hepatosplenomegaly.  MUSCULOSKELETAL: Left hand less edematous and erythematous today. All other extremities are non-edematous  NEUROLOGIC:intermittant  Lethargic due to sedation. Sensory and motor function intact.  SKIN:intact,warm,dry  MEDICATIONS:  I have reviewed all medications and confirmed regimen as documented   CULTURE RESULTS   Recent Results (from the past 240 hour(s))  SARS CORONAVIRUS 2 (TAT  6-24 HRS) Nasopharyngeal Nasopharyngeal Swab     Status: None   Collection Time: 07/28/19  1:19 PM   Specimen: Nasopharyngeal Swab  Result Value Ref Range Status   SARS Coronavirus 2 NEGATIVE NEGATIVE Final    Comment: (NOTE) SARS-CoV-2 target nucleic acids are NOT DETECTED. The SARS-CoV-2 RNA is generally detectable in upper and lower respiratory specimens during the acute phase of infection. Negative results do not preclude SARS-CoV-2 infection, do not rule out co-infections with other pathogens, and should not be used as the sole basis for treatment or other patient management decisions. Negative results must be combined with clinical observations, patient history, and epidemiological information. The expected result is Negative. Fact Sheet for Patients: SugarRoll.be Fact Sheet for Healthcare Providers: https://www.woods-mathews.com/ This test is not yet approved or cleared by the [place] FDA and  has been authorized for detection and/or diagnosis of SARS-CoV-2 by FDA under an Emergency Use Authorization (EUA). This EUA will remain  in effect (meaning this test can be used) for the duration of the COVID-19 declaration under Section 56 4(b)(1) of the Act, 21 U.S.C. section 360bbb-3(b)(1), unless the authorization is terminated or revoked sooner. Performed at [facility], [place] [address] [place], [place] [address]   MRSA PCR Screening     Status: None   Collection Time: 07/31/19  6:20 AM   Specimen: Nasopharyngeal  Result Value Ref Range Status   MRSA by PCR NEGATIVE NEGATIVE Final    Comment:        The GeneXpert MRSA Assay (FDA approved for NASAL specimens only), is one component of a comprehensive MRSA colonization surveillance program. It is not intended to diagnose MRSA infection nor to guide or monitor treatment for MRSA infections. Performed at [facility], [address] [place], [place] [address]    CULTURE, BLOOD (ROUTINE X 2) w Reflex to ID Panel     Status: None (Preliminary result)   Collection Time: 08/04/19 10:48 AM   Specimen: BLOOD  Result Value Ref Range Status   Specimen Description BLOOD Blood Culture adequate volume  Final   Special Requests   Final    BOTTLES DRAWN AEROBIC AND ANAEROBIC BLOOD RIGHT HAND   Culture   Final    NO GROWTH 3 DAYS Performed at [facility], [address], [place],  [address]    Report Status PENDING  Incomplete  CULTURE, BLOOD (ROUTINE X 2) w Reflex to ID Panel     Status: None (Preliminary result)   Collection Time: 08/04/19 10:54 AM   Specimen: BLOOD  Result Value Ref Range Status   Specimen Description   Final    BLOOD Blood Culture results may not be optimal due to an excessive volume of blood received in culture bottles   Special Requests   Final    BOTTLES DRAWN AEROBIC AND ANAEROBIC LEFT ANTECUBITAL   Culture   Final    NO GROWTH 3 DAYS Performed at [facility], [address], [place],  [address]    Report Status PENDING  Incomplete          IMAGING    Ir Gastrostomy Tube Mod Sed  Result Date: 08/06/2019 CLINICAL DATA:  Esophageal stricture and need for gastrostomy tube for nutrition. EXAM: PERCUTANEOUS GASTROSTOMY TUBE PLACEMENT ANESTHESIA/SEDATION: 3.0 mg IV Versed; 100 mcg IV  Fentanyl. Total Moderate Sedation Time 30 minutes. The patient's level of consciousness and physiologic status were continuously monitored during the procedure by Radiology nursing. CONTRAST:  28mL VISIPAQUE IODIXANOL 320 MG/ML IV SOLN MEDICATIONS: 2 g IV Ancef. IV antibiotic was administered in an appropriate time interval prior to needle puncture of the skin. FLUOROSCOPY TIME:  5 minutes and 18 seconds.  100.5 mGy. PROCEDURE: The procedure, risks, benefits, and alternatives were explained to the patient. Questions regarding the procedure were encouraged and answered. The patient understands and consents to the  procedure. A 5-French catheter was then advanced through the patient's mouth under fluoroscopy into the esophagus and to the level of the stomach. This catheter was used to insufflate the stomach with air under fluoroscopy. The abdominal wall was prepped with chlorhexidine in a sterile fashion, and a sterile drape was applied covering the operative field. A sterile gown and sterile gloves were used for the procedure. Local anesthesia was provided with 1% Lidocaine. A skin incision was made in the upper abdominal wall. Under fluoroscopy, an 18 gauge trocar needle was advanced into the stomach. Contrast injection was performed to confirm intraluminal position of the needle tip. A single T tack was then deployed in the lumen of the stomach. This was brought up to tension at the skin surface. A second T tack was then deployed in the lumen of the stomach utilizing similar technique approximately 3 cm from the first T tack. A 19 gauge needle was used to puncture the stomach under fluoroscopy between the 2 T tacks. Intraluminal positioning was confirmed by contrast injection. A guidewire was then advanced through the needle into the gastric lumen and the needle removed. The percutaneous tract was dilated over the wire. A peel-away sheath was placed. A 16 French balloon retention gastrostomy tube was then advanced through the peel-away sheath into the gastric lumen. The peel-away sheath was removed. The retention balloon of the gastrostomy tube was inflated with 6 mL of saline. The catheter was injected with contrast material to confirm position and a fluoroscopic spot image saved. The tube was then flushed with saline. A dressing was applied over the gastrostomy exit site. COMPLICATIONS: None. FINDINGS: The stomach distended well with air allowing safe placement of the gastrostomy tube. After placement, the tip of the gastrostomy tube lies in the body of the stomach. IMPRESSION: Percutaneous gastrostomy with placement of a  7 French balloon retention tube in the body of the stomach. This tube can be used for percutaneous feeds beginning in 24 hours after placement. Electronically Signed   By: Aletta Edouard M.D.   On: 08/06/2019 17:26     Indwelling Urinary Catheter continued, requirement due to   Reason to continue Indwelling Urinary Catheter strict Intake/Output monitoring for hemodynamic instability        ASSESSMENT AND PLAN SYNOPSIS   Severe ACUTE Hypoxic and Hypercapnic Respiratory Failured/t aspiration pneumonia -Patient is breathing comfortably on room air -Monitor RR, SpO2 and symptoms  -continue unasyn (2 days left) -high risk for intubation  -high risk aspiration   Alcohol Withdrawal  -Therapy with thiamine and MVI -Discontinue precedex.  -Start valium, continue ativan pushes  -Pt is lethargic, intermittently confused  Pt continues to have visual hallucinations.  -Continue CIWA protocol   CARDIAC -ICU monitoring -Start lovenox injections again for anticoagulation   ID -Cellulitis of the left hand.  -Monitor and reassess to determine if current regimen of unasyn can clear infection -continue IV abx as prescibed -follow up cultures  GI -PEG  tube placed yesterday. Stat tube feeds this afternoon  -GI PROPHYLAXIS as indicated  NUTRITIONAL STATUS -DIET-->NPO -Constipation protocol as indicated  ENDO - will use ICU hypoglycemic\Hyperglycemia protocol if indicated   ELECTROLYTES -follow labs as needed - monitor potassium and magnesium  -replace as needed -pharmacy consultation and following   DVT/GI PRX ordered TRANSFUSIONS AS NEEDED MONITOR FSBS ASSESS the need for LABS as needed   Critical Care Time devoted to patient care services described in this note is 31 minutes.   Overall, patient is critically ill, prognosis is guarded.   Corrin Parker, M.D.  [facility]  Medical Director [place] Director [facility] Cardio-Pulmonary Department
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
## 2019-08-07 NOTE — Progress Notes (Signed)
Pharmacy Electrolyte Monitoring Consult:  63 y.o. male admitted on 07/28/2019 with Cough and Chest Pain related to an acute PE.  Because of his alcohol use disorder he remains at a high risk for refeeding syndrome.    Labs:  Sodium (mmol/L)  Date Value  08/07/2019 139   Potassium (mmol/L)  Date Value  08/07/2019 3.4 (L)   Magnesium (mg/dL)  Date Value  08/07/2019 2.0   Phosphorus (mg/dL)  Date Value  08/07/2019 3.4   Calcium (mg/dL)  Date Value  08/07/2019 8.2 (L)   Albumin (g/dL)  Date Value  08/07/2019 2.3 (L)   Corrected Ca: 9.54 mg/dL  Plan:   Patient has aspirated several times. Patient is unable to take any PO meds.   Plan had a PEG tube placed 10/26  Replace potassium with 20 mEq IV KCl and 40 mEq oral KCl (goal 4 - 5.1 mmol/L)   Recheck electrolytes tomorrow with am labs  Pharmacy will continue to follow.  Dallie Piles, [facility] 08/07/2019 7:20 AM

## 2019-08-07 NOTE — Progress Notes (Signed)
[place] [facility] [place] NAME: George Wade    MR#:  [id number]  DATE OF BIRTH:  1956/07/24  SUBJECTIVE:   No new complaint this morning.  Patient resting comfortably.  Arousable but easily goes back to sleep.  Patient status post PEG tube placement by interventional radiologist yesterday  REVIEW OF SYSTEMS:  Unobtainable at this time due to medical condition.  DRUG ALLERGIES:   Allergies  Allergen Reactions  . Sulfa Antibiotics Nausea Only    VITALS:  Blood pressure 106/75, pulse 64, temperature 98.9 F (37.2 C), temperature source Axillary, resp. rate 16, height 5\' 8"  (1.727 m), weight 73.8 kg, SpO2 94 %.  PHYSICAL EXAMINATION:  Constitutional: Appears well-developed and well-nourished. No distress. HENT: Normocephalic, atraumatic, Oropharynx is clear and moist.  Eyes: Conjunctivae and EOM are normal. PERRLA, no scleral icterus.  Neck: Normal ROM. Neck supple. No JVD. No tracheal deviation. CVS: RRR, S1/S2 +, no murmurs, no gallops, no carotid bruit.  Pulmonary: + Rhonchi present throughout the entire right lung. +diminished breath sounds in the lung bases bilaterally. [place] in place. Abdominal: Soft. BS +,  no distension, +epigastric abdominal pain, no rebound or guarding.  Musculoskeletal: Normal range of motion. No edema and no tenderness.  Neuro: Alert. CN 2-12 grossly intact. No focal deficits. Skin: Skin is warm and dry. No rash noted. Psychiatric: Normal mood and affect.   LABORATORY PANEL:   CBC Recent Labs  Lab 08/07/19 0338  WBC 8.6  HGB 9.7*  HCT 33.8*  PLT 287   ------------------------------------------------------------------------------------------------------------------  Chemistries  Recent Labs  Lab 08/07/19 0338  NA 139  K 3.4*  CL 112*  CO2 17*  GLUCOSE 97  BUN <5*  CREATININE 0.48*  CALCIUM 8.2*  MG 2.0    ------------------------------------------------------------------------------------------------------------------  Cardiac Enzymes No results for input(s): TROPONINI in the last 168 hours. ------------------------------------------------------------------------------------------------------------------  RADIOLOGY:  Ir Gastrostomy Tube Mod Sed  Result Date: 08/06/2019 CLINICAL DATA:  Esophageal stricture and need for gastrostomy tube for nutrition. EXAM: PERCUTANEOUS GASTROSTOMY TUBE PLACEMENT ANESTHESIA/SEDATION: 3.0 mg IV Versed; 100 mcg IV Fentanyl. Total Moderate Sedation Time 30 minutes. The patient's level of consciousness and physiologic status were continuously monitored during the procedure by Radiology nursing. CONTRAST:  61mL VISIPAQUE IODIXANOL 320 MG/ML IV SOLN MEDICATIONS: 2 g IV Ancef. IV antibiotic was administered in an appropriate time interval prior to needle puncture of the skin. FLUOROSCOPY TIME:  5 minutes and 18 seconds.  100.5 mGy. PROCEDURE: The procedure, risks, benefits, and alternatives were explained to the patient. Questions regarding the procedure were encouraged and answered. The patient understands and consents to the procedure. A 5-French catheter was then advanced through the patient's mouth under fluoroscopy into the esophagus and to the level of the stomach. This catheter was used to insufflate the stomach with air under fluoroscopy. The abdominal wall was prepped with chlorhexidine in a sterile fashion, and a sterile drape was applied covering the operative field. A sterile gown and sterile gloves were used for the procedure. Local anesthesia was provided with 1% Lidocaine. A skin incision was made in the upper abdominal wall. Under fluoroscopy, an 18 gauge trocar needle was advanced into the stomach. Contrast injection was performed to confirm intraluminal position of the needle tip. A single T tack was then deployed in the lumen of the stomach. This was brought  up to tension at the skin surface. A second T tack was then deployed in the lumen of the stomach utilizing similar technique approximately  3 cm from the first T tack. A 19 gauge needle was used to puncture the stomach under fluoroscopy between the 2 T tacks. Intraluminal positioning was confirmed by contrast injection. A guidewire was then advanced through the needle into the gastric lumen and the needle removed. The percutaneous tract was dilated over the wire. A peel-away sheath was placed. A 16 French balloon retention gastrostomy tube was then advanced through the peel-away sheath into the gastric lumen. The peel-away sheath was removed. The retention balloon of the gastrostomy tube was inflated with 6 mL of saline. The catheter was injected with contrast material to confirm position and a fluoroscopic spot image saved. The tube was then flushed with saline. A dressing was applied over the gastrostomy exit site. COMPLICATIONS: None. FINDINGS: The stomach distended well with air allowing safe placement of the gastrostomy tube. After placement, the tip of the gastrostomy tube lies in the body of the stomach. IMPRESSION: Percutaneous gastrostomy with placement of a 31 French balloon retention tube in the body of the stomach. This tube can be used for percutaneous feeds beginning in 24 hours after placement. Electronically Signed   By: Aletta Edouard M.D.   On: 08/06/2019 17:26     ASSESSMENT AND PLAN:   Acute hypoxic and hypercapnic respiratory failure due to aspiration pneumonia-  Noted to have been weaned off oxygen with documented oxygen saturation of 94% on room air this morning.   -Continue unasyn -Continue duonebs  Dysphagia- having severe aspiration episodes -EGD 10/22 with benign-appearing for esophageal stenosis that was dilated -NPO  -Patient status post PEG tube placement by interventional radiologist on 08/06/2019.  Small right lung pulmonary emboli- seen on CT scan Anticoagulation  with therapeutic dose of Lovenox resumed after PEG tube placed.  Chronic iron deficiency anemia- hemoglobin is lower than baseline. -s/p IV iron x1 on 10/18 -Continue to monitor  DTs with a history of EtOH abuse -Precedex drip being weaned off  Elevated LFTs- likely due to alcohol use.  AST/ALT improved. -Right upper quadrant ultrasound showed hepatic steatosis -Monitor  Tobacco use -Nicotine patch ordered  Left hand cellulitis  Felt to be probably due to bug bite.   Patient seen by orthopedic physician this morning.  No evidence of deeper infection that would require I&D at this time.  DVT proph; Lovenox   CODE STATUS: full  TOTAL TIME TAKING CARE OF THIS PATIENT: 33 minutes.   POSSIBLE D/C unknown, DEPENDING ON CLINICAL CONDITION.   Kamiah Fite M.D on 08/07/2019 at 2:55 PM  Between 7am to 6pm - Pager - [phone]  After 6pm go to [email or web address] - password [email or web address] [facility]  Office  [phone]  CC: Primary care physician; Patient, [name] Per  Note: This dictation was prepared with Dragon dictation along with smaller phrase technology. Any transcriptional errors that result from this process are unintentional.

## 2019-08-07 NOTE — Progress Notes (Signed)
Pharmacy Antibiotic Note  Kori Colin. is a 63 y.o. male admitted on 07/28/2019 with aspiration pneumonia.  Patient also presents with possible infection of the left hand. Plan on a stop date of 10 day therapy for Unasyn. Pharmacy has been consulted for Unasyn dosing.  Plan: -Continue Unasyn 3g IV q6h, day 8 -Monitor renal function and s/sx of infection and adverse events  Height: 5\' 8"  (172.7 cm) Weight: 162 lb 11.2 oz (73.8 kg) IBW/kg (Calculated) : 68.4  Temp (24hrs), Avg:98.4 F (36.9 C), Min:97.6 F (36.4 C), Max:98.9 F (37.2 C)  Recent Labs  Lab 08/01/19 0434  08/03/19 0539 08/04/19 0438 08/05/19 0412 08/06/19 0503 08/07/19 0338  WBC 11.2*  --  8.8  --  9.0  --  8.6  CREATININE 0.67   < > 0.59* 0.50* 0.54* 0.40* 0.48*   < > = values in this interval not displayed.    Estimated Creatinine Clearance: 92.6 mL/min (A) (by C-G formula based on SCr of 0.48 mg/dL (L)).    Allergies  Allergen Reactions  . Sulfa Antibiotics Nausea Only   Antimicrobials this admission: Unasyn 10/20 >>10/29  Microbiology Results: 10/24 BCx: no growth x3 days 10/20 MRSA PCR: negative 10/17 SARS-CoV-2: negative  Thank you for allowing pharmacy to be a part of this patient's care.  Sallye Lat, PharmD Candidate 08/07/2019 11:37 AM

## 2019-08-07 NOTE — Progress Notes (Signed)
Nutrition Follow Up Note   DOCUMENTATION CODES:   Non-severe (moderate) malnutrition in context of social or environmental circumstances  INTERVENTION:   Osmolite 1.5 @50ml /hr- Initiate at 16m/hr and increase by 121mhr q 8 hours until goal rate is reached.   Prostat liquid protein 30 ml daily via tube, each supplement provides 100 kcal, 15 grams protein.  Free water flushes 15063m4 hours    Regimen provides 1900kcal/day, 90g/day protein, 1812725DG/[id number] water   Folic acid and thiamine daily in setting of etoh abuse   Pt at high refeed risk; recommend monitor K, Mg and P las daily   Bowel regimen as needed per MD  NUTRITION DIAGNOSIS:   Moderate Malnutrition related to social / environmental circumstances(EtOH abuse, limited access to well-balanced meals) as evidenced by mild-moderate fat depletion, mild-moderate muscle depletion.  GOAL:   Patient will meet greater than or equal to 90% of their needs  -not met  MONITOR:   Labs, Weight trends, TF tolerance, Skin, I & O's  ASSESSMENT:   [age]ar old male with PMHx of HTN, hx non-Hodgkin's lymphoma, hx tonsillar cancer, EtOH abuse admitted with small right lung pulmonary emboli, chronic iron deficiency anemia, dysphagia.   Pt s/p EGD 10/23 found to have benign appearing esophageal stenosis which was dilated. Scope could not be passed beyond stricture.  Pt s/p IR placement of 16 Fr balloon retention gastrostomy tube 10/26  Pt s/p IR G-tube placement. Plan is to initiate tube feeds at 1600 today. Pt is at high refeed risk; will monitor electrolytes. Per chart, pt is weight stable since admit.   Medications reviewed and include: lovenox, nicotine, lasix, protonix, unasyn, precedex, NaCl w/ MVI, folic acid & thiamine  Labs reviewed: K 2.4(L), BUN <5(L), creat 0.48(L), Mg 2.0 wnl Iron 24(L), TIBC 222(L), ferritin 32- 10/18 Hgb 9.7(L), Hct 33.8(L)  Diet Order:   Diet Order            Diet NPO time specified  Diet  effective midnight             EDUCATION NEEDS:   No education needs have been identified at this time  Skin:  Skin Assessment: Reviewed RN Assessment  Last BM:  10/25  Height:   Ht Readings from Last 1 Encounters:  08/06/19 5' 8"  (1.727 m)   Weight:   Wt Readings from Last 1 Encounters:  08/06/19 73.8 kg   Ideal Body Weight:  70 kg  BMI:  Body mass index is 24.74 kg/m.  Estimated Nutritional Needs:   Kcal:  1800-2000  Protein:  90-100 grams  Fluid:  1.8-2 L/day  CasKoleen Distance, RD, LDN Pager #- [phone]#- [phone] Hours Pager: [phone]

## 2019-08-07 NOTE — Progress Notes (Signed)
Pt too sleepy to effectively do metaneb this am, CPT administered by bed times 10  Minutes.

## 2019-08-08 ENCOUNTER — Inpatient Hospital Stay: Payer: Self-pay

## 2019-08-08 DIAGNOSIS — R0603 Acute respiratory distress: Secondary | ICD-10-CM

## 2019-08-08 LAB — LACTIC ACID, PLASMA
Lactic Acid, Venous: 1.2 mmol/L (ref 0.5–1.9)
Lactic Acid, Venous: 1.1 mmol/L (ref 0.5–1.9)

## 2019-08-08 LAB — CBC
HCT: 33.2 % — ABNORMAL LOW (ref 39.0–52.0)
Hemoglobin: 9.6 g/dL — ABNORMAL LOW (ref 13.0–17.0)
MCH: 23.6 pg — ABNORMAL LOW (ref 26.0–34.0)
MCHC: 28.9 g/dL — ABNORMAL LOW (ref 30.0–36.0)
MCV: 81.8 fL (ref 80.0–100.0)
Platelets: 302 10*3/uL (ref 150–400)
RBC: 4.06 MIL/uL — ABNORMAL LOW (ref 4.22–5.81)
RDW: 26 % — ABNORMAL HIGH (ref 11.5–15.5)
WBC: 8.1 10*3/uL (ref 4.0–10.5)
nRBC: 0 % (ref 0.0–0.2)

## 2019-08-08 LAB — RENAL FUNCTION PANEL
Albumin: 2.4 g/dL — ABNORMAL LOW (ref 3.5–5.0)
Anion gap: 7 (ref 5–15)
BUN: <5 mg/dL — ABNORMAL LOW (ref 8–23)
CO2: 21 mmol/L — ABNORMAL LOW (ref 22–32)
Calcium: 8.3 mg/dL — ABNORMAL LOW (ref 8.9–10.3)
Chloride: 111 mmol/L (ref 98–111)
Creatinine, Ser: 0.39 mg/dL — ABNORMAL LOW (ref 0.61–1.24)
GFR calc Af Amer: >60 mL/min (ref >60)
GFR calc non Af Amer: >60 mL/min (ref >60)
Glucose, Bld: 139 mg/dL — ABNORMAL HIGH (ref 70–99)
Phosphorus: 2.2 mg/dL — ABNORMAL LOW (ref 2.5–4.6)
Potassium: 3.2 mmol/L — ABNORMAL LOW (ref 3.5–5.1)
Sodium: 139 mmol/L (ref 135–145)

## 2019-08-08 LAB — BLOOD GAS, ARTERIAL
Acid-base deficit: 4.1 mmol/L — ABNORMAL HIGH (ref 0.0–2.0)
Acid-base deficit: 3.1 mmol/L — ABNORMAL HIGH (ref 0.0–2.0)
Bicarbonate: 20 mmol/L (ref 20.0–28.0)
Bicarbonate: 21.2 mmol/L (ref 20.0–28.0)
FIO2: 0.21
FIO2: 0.28
O2 Saturation: 93.4 %
O2 Saturation: 95.4 %
Patient temperature: 37
Patient temperature: 37
pCO2 arterial: 33 mmHg (ref 32.0–48.0)
pCO2 arterial: 35 mmHg (ref 32.0–48.0)
pH, Arterial: 7.39 (ref 7.350–7.450)
pH, Arterial: 7.39 (ref 7.350–7.450)
pO2, Arterial: 69 mmHg — ABNORMAL LOW (ref 83.0–108.0)
pO2, Arterial: 79 mmHg — ABNORMAL LOW (ref 83.0–108.0)

## 2019-08-08 LAB — GLUCOSE, CAPILLARY: Glucose-Capillary: 112 mg/dL — ABNORMAL HIGH (ref 70–99)

## 2019-08-08 LAB — MAGNESIUM: Magnesium: 1.9 mg/dL (ref 1.7–2.4)

## 2019-08-08 MED ORDER — SODIUM CHLORIDE 0.9 % IV SOLN
25.0000 mg | Freq: Four times a day (QID) | INTRAVENOUS | Status: DC | PRN
  Administered 2019-08-08: 25 mg via INTRAVENOUS
  Filled 2019-08-08 (×2): qty 1

## 2019-08-08 MED ORDER — LACTATED RINGERS IV SOLN
INTRAVENOUS | Status: DC
  Administered 2019-08-08: 23:00:00 via INTRAVENOUS

## 2019-08-08 MED ORDER — MAGNESIUM SULFATE IN D5W 1-5 GM/100ML-% IV SOLN
1.0000 g | Freq: Once | INTRAVENOUS | Status: AC
  Administered 2019-08-08: 1 g via INTRAVENOUS
  Filled 2019-08-08: qty 100

## 2019-08-08 MED ORDER — SODIUM CHLORIDE 0.9 % IV BOLUS
250.0000 mL | Freq: Once | INTRAVENOUS | Status: AC
  Administered 2019-08-08: 250 mL via INTRAVENOUS

## 2019-08-08 MED ORDER — IPRATROPIUM-ALBUTEROL 0.5-2.5 (3) MG/3ML IN SOLN
3.0000 mL | Freq: Three times a day (TID) | RESPIRATORY_TRACT | Status: DC
  Administered 2019-08-08 – 2019-08-10 (×5): 3 mL via RESPIRATORY_TRACT
  Filled 2019-08-08 (×5): qty 3

## 2019-08-08 MED ORDER — POTASSIUM PHOSPHATES 15 MMOLE/5ML IV SOLN
20.0000 mmol | Freq: Once | INTRAVENOUS | Status: AC
  Administered 2019-08-08: 20 mmol via INTRAVENOUS
  Filled 2019-08-08: qty 6.67

## 2019-08-08 MED ORDER — IPRATROPIUM-ALBUTEROL 0.5-2.5 (3) MG/3ML IN SOLN
3.0000 mL | Freq: Four times a day (QID) | RESPIRATORY_TRACT | Status: DC
  Administered 2019-08-08: 3 mL via RESPIRATORY_TRACT
  Filled 2019-08-08: qty 3

## 2019-08-08 NOTE — Progress Notes (Signed)
Pharmacy Electrolyte Monitoring Consult:  63 y.o. male admitted on 07/28/2019 with Cough and Chest Pain related to an acute PE.  Because of his alcohol use disorder he remains at a high risk for refeeding syndrome.    Labs:  Sodium (mmol/L)  Date Value  08/08/2019 139   Potassium (mmol/L)  Date Value  08/08/2019 3.2 (L)   Magnesium (mg/dL)  Date Value  08/08/2019 1.9   Phosphorus (mg/dL)  Date Value  08/08/2019 2.2 (L)   Calcium (mg/dL)  Date Value  08/08/2019 8.3 (L)   Albumin (g/dL)  Date Value  08/08/2019 2.4 (L)   Corrected Ca: 9.58 mg/dL  Plan:   Patient has aspirated several times. Patient is unable to take any PO meds.   He had a PEG tube placed 10/26 and tube feeds were initiated yesterday, but are now being held due to nausea and vomiting  Replace potassium and phosphorous with 20 mmol IV potassium phosphate (this provides 29.5 mEq potassium):  (goal 4 - 5.1 mmol/L)   Replace magnesium with 1 gram IV magnesium sulfate (goal 2.0 - 2.4 mg/dL)  Recheck electrolytes tomorrow with am labs  Pharmacy will continue to follow.  Dallie Piles, [facility] 08/08/2019 7:24 AM

## 2019-08-08 NOTE — Progress Notes (Addendum)
CRITICAL CARE NOTE  CC  follow up pulmonary emboli, esophageal stricture and alcohol withdrawal  SUBJECTIVE Patient remains critically ill Prognosis is guarded Pt condition is worsening today - he appears more lethargic and weak.  PEG tube feeds initiated yesterday, but are now held due to nausea and vomiting Precedex drip has been discontinued. Valium and ativan pushes initiated  High risk intubation  High risk aspiration    BP 112/90   Pulse (!) 124   Temp 98.9 F (37.2 C) (Axillary)   Resp (!) 26   Ht 5\' 8"  (1.727 m)   Wt 73.8 kg   SpO2 91%   BMI 24.74 kg/m    I/O last 3 completed shifts: In: 1823.7 [I.V.:1223.1; IV Piggyback:600.6] Out: 6269 [Urine:2350] Total I/O In: 239.1 [IV Piggyback:239.1] Out: 0   SpO2: 91 % O2 Flow Rate (L/min): 3 L/min FiO2 (%): (!) 2 %   SIGNIFICANT EVENTS 10/17: Presented to the ED with SOB and was admitted to the hospital for pulmonary emboli. He was transferred to the floor and started on a heparin drip. CIWA protocol initiated.  10/18: Attempt to switch patient from heparin drip to eliquis but patient complains of nausea and unable to tolerate anything PO. Pt became irritated and had some mild shaking. CIWA protocol continued. Potassium of 2.8, replacement initiated.  10/19: Pt complaining of difficulty swallowing and vomiting. Placed on IV PPI and made NPO.  10/20: Transferred to ICU for rapid respirations and SpO2 of 93% despite being on nonrebreather. Placed on HFNC at 15L with solumedrol and duoneb treatments. Started on unasyn for aspiration pneumonia. CIWA protocol continued.  10/21: Remains on HFNC. CIWA protocol continued. Precedex infusions for DTs 10/22: DTs treated with precedex. Pt on 2L [place]. GI consulted for endoscopy. Pt underwent endoscopy, severe esophageal stricture found. GI recommended PEG tube placement.  10/23: Pt remains on 2L O2 and precedex drip for DTs. IR consulted, and PEG tube placement scheduled for Monday.  Continue lovenox for anticoagulation. Last dose should be Sunday AM. 10/24:Patient is in no distress, able to communicate without encouragement. 10/25: Withdrawal symptoms with aggitation , patient on precedex now with improvement 10/26: Pt breathing comfortably on room air. He is still having visual hallucinations, and continues on precedex drip. Scheduled to have PEG tube placed today by IR. 10/27: Pt breathing comfortably on room air. Still having visual hallucinations, but is more alert than days before. PEG tube was placed by IR yesterday. Pt tolerated procedure well and can start tube feeds later today.  10/28: Pt is not tolerating tube feeds. Has been nauseous and vomiting. Pt is more weak and lethargic today. Consider intubation.   REVIEW OF SYSTEMS  PATIENT IS UNABLE TO PROVIDE COMPLETE REVIEW OF SYSTEMS DUE TO SEVERE CRITICAL ILLNESS   PHYSICAL EXAMINATION:  GENERAL:critically ill appearing, more lethargic and less alert today HEAD: Normocephalic, atraumatic.  EYES: Pupils equal, round, reactive to light.  No scleral icterus.  MOUTH: Dry mouth and lips  NECK: Supple.  PULMONARY: CTA bilaterally  CARDIOVASCULAR: S1 and S2. Regular rate and rhythm. No murmurs, rubs, or gallops.  GASTROINTESTINAL: PEG tube in place with dressing over it. Soft, tender, -distended. No masses. Hypoactive bowel sounds. No hepatosplenomegaly.  MUSCULOSKELETAL: Left hand edema and erythema improving. Motor function is minimal, had difficulty moving hands.  NEUROLOGIC: Awake, disoriented and lethargic  SKIN:intact,warm,dry  MEDICATIONS: I have reviewed all medications and confirmed regimen as documented   CULTURE RESULTS   Recent Results (from the past 240 hour(s))  MRSA  PCR Screening     Status: None   Collection Time: 07/31/19  6:20 AM   Specimen: Nasopharyngeal  Result Value Ref Range Status   MRSA by PCR NEGATIVE NEGATIVE Final    Comment:        The GeneXpert MRSA Assay (FDA approved  for NASAL specimens only), is one component of a comprehensive MRSA colonization surveillance program. It is not intended to diagnose MRSA infection nor to guide or monitor treatment for MRSA infections. Performed at [facility], [address] [place], [place] [address]   CULTURE, BLOOD (ROUTINE X 2) w Reflex to ID Panel     Status: None (Preliminary result)   Collection Time: 08/04/19 10:48 AM   Specimen: BLOOD  Result Value Ref Range Status   Specimen Description BLOOD Blood Culture adequate volume  Final   Special Requests   Final    BOTTLES DRAWN AEROBIC AND ANAEROBIC BLOOD RIGHT HAND   Culture   Final    NO GROWTH 4 DAYS Performed at [facility], [address] [place], [place] [address]    Report Status PENDING  Incomplete  CULTURE, BLOOD (ROUTINE X 2) w Reflex to ID Panel     Status: None (Preliminary result)   Collection Time: 08/04/19 10:54 AM   Specimen: BLOOD  Result Value Ref Range Status   Specimen Description   Final    BLOOD Blood Culture results may not be optimal due to an excessive volume of blood received in culture bottles   Special Requests   Final    BOTTLES DRAWN AEROBIC AND ANAEROBIC LEFT ANTECUBITAL   Culture   Final    NO GROWTH 4 DAYS Performed at [facility], [address] [place], [place] [address]    Report Status PENDING  Incomplete         Indwelling Urinary Catheter continued, requirement due to   Reason to continue Indwelling Urinary Catheter strict Intake/Output monitoring for hemodynamic instability      ASSESSMENT AND PLAN SYNOPSIS   Severe ACUTE Hypoxic and Hypercapnic Respiratory Failured/t aspiration pneumonia -Patient is breathing comfortably on room air -Monitor RR, SpO2 and symptoms  -continue unasyn (1 day left) -high risk for intubation  -high risk aspiration -ABG appeared normal this morning. Continue to monitor    Alcohol Withdrawal -Therapy with thiamine and  MVI -Discontinue precedex.  -Start valium, continue ativan pushes  -Pt is lethargic,intermittently confused Pt continues to have visual hallucinations.  -Continue CIWA protocol   CARDIAC -ICU monitoring -Continue lovenox injections for anticoagulation   ID -Cellulitis of the left hand.  -continue IV abx as prescibed -follow up cultures  GI -Tube feeds started yesterday - pt could not tolerate -Hold tube feeds  -GI PROPHYLAXIS as indicated  NUTRITIONAL STATUS -DIET-->NPO -Constipation protocol as indicated  ENDO - will use ICU hypoglycemic\Hyperglycemia protocol if indicated   ELECTROLYTES -follow labs as needed- monitor potassium and magnesium -replace as needed - replace phosphorous and potassium  -pharmacy consultation and following   DVT/GI PRX ordered TRANSFUSIONS AS NEEDED MONITOR FSBS ASSESS the need for LABS as needed   Critical Care Time devoted to patient care services described in this note is37minutes.   Overall, patient is critically ill, prognosis is guarded. High risk of intubation.    Corrin Parker, M.D.  [facility]  Medical Director [place] Director [facility] Cardio-Pulmonary Department

## 2019-08-08 NOTE — Progress Notes (Signed)
Pt noted to be lethargic upon evening rounds.  He will arouse to voice and follow simple commands, but doses back off to sleep during conversation.  Pupils are PERRLA, no focal deficits noted.  BP is soft (89/58, MAP 68).  Feeds have been off today due to N/V.  Will give 250 NS bolus and consider maintenance IV fluids while feeds off.  Will also obtain ABG.   Darel Hong, AGACNP-BC [place] [facility] Pager: [phone]

## 2019-08-08 NOTE — Progress Notes (Signed)
Pharmacy Antibiotic Note  George Wade. is a 63 y.o. male admitted on 07/28/2019 with aspiration pneumonia.  Patient also presents with possible infection of the left hand. Plan on a stop date of 10 day therapy for Unasyn. Pharmacy has been consulted for Unasyn dosing.  Plan: -Continue Unasyn 3g IV q6h, day 9 -Monitor renal function and s/sx of infection and adverse events  Height: 5\' 8"  (172.7 cm) Weight: 162 lb 11.2 oz (73.8 kg) IBW/kg (Calculated) : 68.4  Temp (24hrs), Avg:98.7 F (37.1 C), Min:98.2 F (36.8 C), Max:99 F (37.2 C)  Recent Labs  Lab 08/03/19 0539 08/04/19 0438 08/05/19 0412 08/06/19 0503 08/07/19 0338 08/08/19 0403  WBC 8.8  --  9.0  --  8.6 8.1  CREATININE 0.59* 0.50* 0.54* 0.40* 0.48* 0.39*    Estimated Creatinine Clearance: 91.4 mL/min (A) (by C-G formula based on SCr of 0.39 mg/dL (L)).    Allergies  Allergen Reactions  . Sulfa Antibiotics Nausea Only   Antimicrobials this admission: Unasyn 10/20 >>10/29  Microbiology Results: 10/24 BCx: no growth x4 days 10/20 MRSA PCR: negative 10/17 SARS-CoV-2: negative  Thank you for allowing pharmacy to be a part of this patient's care.  Sallye Lat, PharmD Candidate 08/08/2019 10:37 AM

## 2019-08-08 NOTE — Progress Notes (Signed)
[place] at [place] NAME: Noel Rodier    MR#:  [id number]  DATE OF BIRTH:  10-13-1955  SUBJECTIVE:   Tube feeds had to be placed on hold last night due to patient having vomiting.  No residuals reported.  Patient resting comfortably.  Arousable but easily goes back to sleep.  Patient status post PEG tube placement by interventional radiologist on 08/06/2019  REVIEW OF SYSTEMS:  Unobtainable at this time due to medical condition.  DRUG ALLERGIES:   Allergies  Allergen Reactions  . Sulfa Antibiotics Nausea Only    VITALS:  Blood pressure 120/83, pulse (!) 111, temperature 98.9 F (37.2 C), temperature source Axillary, resp. rate (!) 21, height 5\' 8"  (1.727 m), weight 73.8 kg, SpO2 92 %.  PHYSICAL EXAMINATION:  Constitutional: Appears well-developed and well-nourished. No distress. HENT: Normocephalic, atraumatic, Oropharynx is clear and moist.  Eyes: Conjunctivae and EOM are normal. PERRLA, no scleral icterus.  Neck: Normal ROM. Neck supple. No JVD. No tracheal deviation. CVS: RRR, S1/S2 +, no murmurs, no gallops, no carotid bruit.  Pulmonary: + Rhonchi present throughout the entire right lung. +diminished breath sounds in the lung bases bilaterally. [place] in place. Abdominal: Soft. BS +,  no distension, +epigastric abdominal pain, no rebound or guarding.  Musculoskeletal: Normal range of motion. No edema and no tenderness.  Neuro: Alert. CN 2-12 grossly intact. No focal deficits. Skin: Skin is warm and dry. No rash noted. Psychiatric: Normal mood and affect.   LABORATORY PANEL:   CBC Recent Labs  Lab 08/08/19 0403  WBC 8.1  HGB 9.6*  HCT 33.2*  PLT 302   ------------------------------------------------------------------------------------------------------------------  Chemistries  Recent Labs  Lab 08/08/19 0403  NA 139  K 3.2*  CL 111  CO2 21*  GLUCOSE 139*  BUN <5*  CREATININE 0.39*  CALCIUM 8.3*  MG 1.9    ------------------------------------------------------------------------------------------------------------------  Cardiac Enzymes No results for input(s): TROPONINI in the last 168 hours. ------------------------------------------------------------------------------------------------------------------  RADIOLOGY:  Ir Gastrostomy Tube Mod Sed  Result Date: 08/06/2019 CLINICAL DATA:  Esophageal stricture and need for gastrostomy tube for nutrition. EXAM: PERCUTANEOUS GASTROSTOMY TUBE PLACEMENT ANESTHESIA/SEDATION: 3.0 mg IV Versed; 100 mcg IV Fentanyl. Total Moderate Sedation Time 30 minutes. The patient's level of consciousness and physiologic status were continuously monitored during the procedure by Radiology nursing. CONTRAST:  37mL VISIPAQUE IODIXANOL 320 MG/ML IV SOLN MEDICATIONS: 2 g IV Ancef. IV antibiotic was administered in an appropriate time interval prior to needle puncture of the skin. FLUOROSCOPY TIME:  5 minutes and 18 seconds.  100.5 mGy. PROCEDURE: The procedure, risks, benefits, and alternatives were explained to the patient. Questions regarding the procedure were encouraged and answered. The patient understands and consents to the procedure. A 5-French catheter was then advanced through the patient's mouth under fluoroscopy into the esophagus and to the level of the stomach. This catheter was used to insufflate the stomach with air under fluoroscopy. The abdominal wall was prepped with chlorhexidine in a sterile fashion, and a sterile drape was applied covering the operative field. A sterile gown and sterile gloves were used for the procedure. Local anesthesia was provided with 1% Lidocaine. A skin incision was made in the upper abdominal wall. Under fluoroscopy, an 18 gauge trocar needle was advanced into the stomach. Contrast injection was performed to confirm intraluminal position of the needle tip. A single T tack was then deployed in the lumen of the stomach. This was brought  up to tension at the skin surface.  A second T tack was then deployed in the lumen of the stomach utilizing similar technique approximately 3 cm from the first T tack. A 19 gauge needle was used to puncture the stomach under fluoroscopy between the 2 T tacks. Intraluminal positioning was confirmed by contrast injection. A guidewire was then advanced through the needle into the gastric lumen and the needle removed. The percutaneous tract was dilated over the wire. A peel-away sheath was placed. A 16 French balloon retention gastrostomy tube was then advanced through the peel-away sheath into the gastric lumen. The peel-away sheath was removed. The retention balloon of the gastrostomy tube was inflated with 6 mL of saline. The catheter was injected with contrast material to confirm position and a fluoroscopic spot image saved. The tube was then flushed with saline. A dressing was applied over the gastrostomy exit site. COMPLICATIONS: None. FINDINGS: The stomach distended well with air allowing safe placement of the gastrostomy tube. After placement, the tip of the gastrostomy tube lies in the body of the stomach. IMPRESSION: Percutaneous gastrostomy with placement of a 41 French balloon retention tube in the body of the stomach. This tube can be used for percutaneous feeds beginning in 24 hours after placement. Electronically Signed   By: Aletta Edouard M.D.   On: 08/06/2019 17:26     ASSESSMENT AND PLAN:   Acute hypoxic and hypercapnic respiratory failure due to aspiration pneumonia-  Noted to have been weaned off oxygen with documented oxygen saturation of 94% on room air this morning.   -Continue unasyn -Continue duonebs  Dysphagia- having severe aspiration episodes -EGD 10/22 with benign-appearing for esophageal stenosis that was dilated -NPO .Patient status post PEG tube placement by interventional radiologist on 08/06/2019. Tube feeds placed on hold last night due to patient having vomiting.  No  new sugars reported.  Small right lung pulmonary emboli- seen on CT scan Anticoagulation with therapeutic dose of Lovenox resumed after PEG tube placed.  Chronic iron deficiency anemia- hemoglobin is lower than baseline. -s/p IV iron x1 on 10/18 -Continue to monitor  DTs with a history of EtOH abuse -Precedex drip being weaned off  Elevated LFTs- likely due to alcohol use.  AST/ALT improved. -Right upper quadrant ultrasound showed hepatic steatosis -Monitor  Tobacco use -Nicotine patch ordered  Left hand cellulitis  Felt to be probably due to bug bite.   Patient seen by orthopedic physician this morning.  No evidence of deeper infection that would require I&D at this time.  DVT proph; Lovenox   CODE STATUS: full  TOTAL TIME TAKING CARE OF THIS PATIENT: 35 minutes.   POSSIBLE D/C unknown, DEPENDING ON CLINICAL CONDITION.   Omya Winfield M.D on 08/08/2019 at 1:30 PM  Between 7am to 6pm - Pager - [phone]  After 6pm go to [email or web address] - password [email or web address] [facility]  Office  [phone]  CC: Primary care physician; Patient, [name] Per  Note: This dictation was prepared with Dragon dictation along with smaller phrase technology. Any transcriptional errors that result from this process are unintentional.

## 2019-08-08 NOTE — Progress Notes (Signed)
Patient is more lethargic today and less oriented. Pt is working harder to breathe and HR is elevated in the 120s. Dr. Mortimer Fries has ordered an ABG and wants to possibly intubate pt today. I spoke to pt about this but it was difficult for him to understand.

## 2019-08-09 LAB — RENAL FUNCTION PANEL
Albumin: 2.2 g/dL — ABNORMAL LOW (ref 3.5–5.0)
Anion gap: 8 (ref 5–15)
BUN: <5 mg/dL — ABNORMAL LOW (ref 8–23)
CO2: 21 mmol/L — ABNORMAL LOW (ref 22–32)
Calcium: 8.3 mg/dL — ABNORMAL LOW (ref 8.9–10.3)
Chloride: 111 mmol/L (ref 98–111)
Creatinine, Ser: 0.38 mg/dL — ABNORMAL LOW (ref 0.61–1.24)
GFR calc Af Amer: >60 mL/min (ref >60)
GFR calc non Af Amer: >60 mL/min (ref >60)
Glucose, Bld: 84 mg/dL (ref 70–99)
Phosphorus: 3.8 mg/dL (ref 2.5–4.6)
Potassium: 3.1 mmol/L — ABNORMAL LOW (ref 3.5–5.1)
Sodium: 140 mmol/L (ref 135–145)

## 2019-08-09 LAB — CULTURE, BLOOD (ROUTINE X 2)
Culture: NO GROWTH
Culture: NO GROWTH
Specimen Description: ADEQUATE

## 2019-08-09 LAB — CBC
HCT: 30.6 % — ABNORMAL LOW (ref 39.0–52.0)
Hemoglobin: 8.8 g/dL — ABNORMAL LOW (ref 13.0–17.0)
MCH: 24 pg — ABNORMAL LOW (ref 26.0–34.0)
MCHC: 28.8 g/dL — ABNORMAL LOW (ref 30.0–36.0)
MCV: 83.6 fL (ref 80.0–100.0)
Platelets: 262 10*3/uL (ref 150–400)
RBC: 3.66 MIL/uL — ABNORMAL LOW (ref 4.22–5.81)
RDW: 26.3 % — ABNORMAL HIGH (ref 11.5–15.5)
WBC: 6.1 10*3/uL (ref 4.0–10.5)
nRBC: 0 % (ref 0.0–0.2)

## 2019-08-09 LAB — MAGNESIUM: Magnesium: 2.2 mg/dL (ref 1.7–2.4)

## 2019-08-09 MED ORDER — OSMOLITE 1.5 CAL PO LIQD
1000.0000 mL | ORAL | Status: DC
  Administered 2019-08-09 – 2019-08-10 (×2): 1000 mL

## 2019-08-09 MED ORDER — POTASSIUM CHLORIDE 20 MEQ PO PACK
40.0000 meq | PACK | Freq: Every day | ORAL | Status: AC
  Administered 2019-08-09: 22:00:00 40 meq
  Filled 2019-08-09: qty 2

## 2019-08-09 MED ORDER — POTASSIUM CHLORIDE 20 MEQ PO PACK
40.0000 meq | PACK | ORAL | Status: AC
  Administered 2019-08-09 (×2): 40 meq
  Filled 2019-08-09 (×2): qty 2

## 2019-08-09 MED ORDER — APIXABAN 5 MG PO TABS: 5.0000 mg | ORAL_TABLET | Freq: Two times a day (BID) | ORAL | Status: DC

## 2019-08-09 MED ORDER — APIXABAN 5 MG PO TABS
10.0000 mg | ORAL_TABLET | Freq: Two times a day (BID) | ORAL | Status: DC
  Administered 2019-08-09 – 2019-08-10 (×2): 10 mg via ORAL
  Filled 2019-08-09 (×2): qty 2

## 2019-08-09 MED ORDER — POTASSIUM CHLORIDE 10 MEQ/100ML IV SOLN
10.0000 meq | INTRAVENOUS | Status: DC
  Administered 2019-08-09: 10 meq via INTRAVENOUS
  Filled 2019-08-09 (×4): qty 100

## 2019-08-09 NOTE — Evaluation (Signed)
Physical Therapy Evaluation Patient Details Name: George Wade. MRN: [id number] DOB: December 11, 1955 Today's Date: 08/09/2019   History of Present Illness  Pt admitted for chest pain and now diagnosed with alcohol induced mood disorder. History includes alcohol abuse and HTN. Hospital stay complicated by transfer to CCU secondary to respiratory distress on 07/31/19 and then PEG tube placement on 10/26 secondary to severe esophageal stricture.   Clinical Impression  Pt is a pleasant 63 year old male who was admitted for chest pain and now diagnosed with alcohol induced mood disorder. Pt performs bed mobility with max assist +2, transfers with mod A, and ambulation with mod A +2. Pt is poor historian; unsure of accurate PLOF. Pt demonstrates deficits with strength/pain/balance. All mobility performed on 2L of O2. Would benefit from skilled PT to address above deficits and promote optimal return to PLOF; recommend transition to STR upon discharge from acute hospitalization.     Follow Up Recommendations SNF    Equipment Recommendations  Rolling walker with 5" wheels    Recommendations for Other Services       Precautions / Restrictions Precautions Precautions: Fall Restrictions Weight Bearing Restrictions: No      Mobility  Bed Mobility Overal bed mobility: Needs Assistance Bed Mobility: Sit to Supine       Sit to supine: Max assist;+2 for physical assistance   General bed mobility comments: needs significant help to lay down and reposition.   Transfers Overall transfer level: Needs assistance Equipment used: Rolling walker (2 wheeled) Transfers: Sit to/from Stand Sit to Stand: Mod assist         General transfer comment: cues to push up from seated surface. Once standing, heavy R side leaning noted, able to somewhat self correct, but only temporary  Ambulation/Gait Ambulation/Gait assistance: Mod assist;+2 physical assistance Gait Distance (Feet): 2 Feet Assistive  device: Rolling walker (2 wheeled) Gait Pattern/deviations: Step-to pattern     General Gait Details: ambulated from chair to bed. Heavy lean towards R side and has difficulty taking steps. Takes extended time for line management  Stairs            Wheelchair Mobility    Modified Rankin (Stroke Patients Only)       Balance Overall balance assessment: Needs assistance;History of Falls Sitting-balance support: Feet supported Sitting balance-Leahy Scale: Fair     Standing balance support: Bilateral upper extremity supported Standing balance-Leahy Scale: Poor Standing balance comment: heavy R side leaning                             Pertinent Vitals/Pain      Home Living Family/patient expects to be discharged to:: Shelter/Homeless                 Additional Comments: per notes, has been living out of his truck.    Prior Function Level of Independence: Independent         Comments: Indep with ADLs, household and community mobilization without assist device. Reports no falls, however poor historian     Hand Dominance        Extremity/Trunk Assessment   Upper Extremity Assessment Upper Extremity Assessment: Generalized weakness(B UE grossly 4/5)    Lower Extremity Assessment Lower Extremity Assessment: Generalized weakness(B ankle 5/5; knee flex/ext 4/5; hip flex 5/5)       Communication   Communication: No difficulties  Cognition Arousal/Alertness: Awake/alert Behavior During Therapy: Flat affect Overall Cognitive Status: Impaired/Different from  baseline                                 General Comments: disoriented to year and PLOF.      General Comments General comments (skin integrity, edema, etc.): appears frail, reports no home O2    Exercises Other Exercises Other Exercises: B LE seated ther-ex including AP, quad sets, resisted hip add/abd squeezes, SLRs, and glut sets. All ther-ex performed x 10 reps with  supervision. HR at 119bpm with exertion.   Assessment/Plan    PT Assessment Patient needs continued PT services  PT Problem List Decreased strength;Decreased activity tolerance;Decreased balance;Decreased mobility;Decreased cognition;Decreased safety awareness;Cardiopulmonary status limiting activity;Pain       PT Treatment Interventions DME instruction;Gait training;Therapeutic activities;Therapeutic exercise;Balance training    PT Goals (Current goals can be found in the Care Plan section)  Acute Rehab PT Goals Patient Stated Goal: to get stronger PT Goal Formulation: With patient Time For Goal Achievement: 08/23/19 Potential to Achieve Goals: Good    Frequency Min 2X/week   Barriers to discharge        Co-evaluation               AM-PAC PT "6 Clicks" Mobility  Outcome Measure Help needed turning from your back to your side while in a flat bed without using bedrails?: Total Help needed moving from lying on your back to sitting on the side of a flat bed without using bedrails?: Total Help needed moving to and from a bed to a chair (including a wheelchair)?: A Lot Help needed standing up from a chair using your arms (e.g., wheelchair or bedside chair)?: A Lot Help needed to walk in hospital room?: Total Help needed climbing 3-5 steps with a railing? : Total 6 Click Score: 8    End of Session Equipment Utilized During Treatment: Gait belt;Oxygen Activity Tolerance: Patient tolerated treatment well Patient left: in bed;with bed alarm set Nurse Communication: Mobility status PT Visit Diagnosis: Muscle weakness (generalized) (M62.81);Difficulty in walking, not elsewhere classified (R26.2);History of falling (Z91.81);Pain;Unsteadiness on feet (R26.81)    Time: 9381-0175 PT Time Calculation (min) (ACUTE ONLY): 43 min   Charges:   PT Evaluation $PT Eval Moderate Complexity: 1 Mod PT Treatments $Therapeutic Exercise: 23-37 mins        Greggory Stallion, PT,  DPT [phone]   Tyresa Prindiville 08/09/2019, 4:46 PM

## 2019-08-09 NOTE — Progress Notes (Signed)
[facility] NAME: Taje Littler    MR#:  [id number]  DATE OF BIRTH:  June 04, 1956  SUBJECTIVE:   No new complaint this morning.  Patient with some discomfort around his upper abdomen.  No fevers.  Patient status post PEG tube placement by interventional radiologist on 08/06/2019  REVIEW OF SYSTEMS:  Unobtainable at this time due to medical condition.  DRUG ALLERGIES:   Allergies  Allergen Reactions  . Sulfa Antibiotics Nausea Only    VITALS:  Blood pressure (!) 125/101, pulse (!) 108, temperature (P) 98.4 F (36.9 C), resp. rate (!) 24, height 5\' 8"  (1.727 m), weight 73.8 kg, SpO2 100 %.  PHYSICAL EXAMINATION:  Constitutional: Appears well-developed and well-nourished. No distress. HENT: Normocephalic, atraumatic, Oropharynx is clear and moist.  Eyes: Conjunctivae and EOM are normal. PERRLA, no scleral icterus.  Neck: Normal ROM. Neck supple. No JVD. No tracheal deviation. CVS: RRR, S1/S2 +, no murmurs, no gallops, no carotid bruit.  Pulmonary: + Rhonchi present throughout the entire right lung. +diminished breath sounds in the lung bases bilaterally. Vincent in place. Abdominal: Soft. BS +,  no distension, +epigastric abdominal pain, no rebound or guarding.  Musculoskeletal: Normal range of motion. No edema and no tenderness.  Neuro: Alert. CN 2-12 grossly intact. No focal deficits. Skin: Skin is warm and dry. No rash noted. Psychiatric: Normal mood and affect.   LABORATORY PANEL:   CBC Recent Labs  Lab 08/09/19 0319  WBC 6.1  HGB 8.8*  HCT 30.6*  PLT 262   ------------------------------------------------------------------------------------------------------------------  Chemistries  Recent Labs  Lab 08/09/19 0319  NA 140  K 3.1*  CL 111  CO2 21*  GLUCOSE 84  BUN <5*  CREATININE 0.38*  CALCIUM 8.3*  MG 2.2    ------------------------------------------------------------------------------------------------------------------  Cardiac Enzymes No results for input(s): TROPONINI in the last 168 hours. ------------------------------------------------------------------------------------------------------------------  RADIOLOGY:  Dg Abd 1 View  Result Date: 08/08/2019 CLINICAL DATA:  Status post PEG placement. EXAM: ABDOMEN - 1 VIEW COMPARISON:  Single-view of the abdomen 07/29/2019. FINDINGS: Feeding tube is identified with its tip projecting over the left aspect of the L2 vertebral body. Contrast in the colon is noted. IMPRESSION: New feeding tube in place. Electronically Signed   By: Inge Rise M.D.   On: 08/08/2019 13:55     ASSESSMENT AND PLAN:   Acute hypoxic and hypercapnic respiratory failure due to aspiration pneumonia-  Noted to have been weaned off oxygen with documented oxygen saturation of 100% on 2 L this morning.   -Continue unasyn -Continue duonebs  Dysphagia- having severe aspiration episodes -EGD 10/22 with benign-appearing for esophageal stenosis that was dilated -NPO .Patient status post PEG tube placement by interventional radiologist on 08/06/2019. Tube feeds placed on hold due to patient having vomiting previously.    Small right lung pulmonary emboli- seen on CT scan Anticoagulation with therapeutic dose of Lovenox resumed after PEG tube placed.  Chronic iron deficiency anemia- hemoglobin is lower than baseline. -s/p IV iron x1 on 10/18 -Continue to monitor  DTs with a history of EtOH abuse -Patient weaned off Precedex drip.  Continue thiamine, folate and multivitamin.  CIWA protocol  Elevated LFTs- likely due to alcohol use.  AST/ALT improved. -Right upper quadrant ultrasound showed hepatic steatosis -Monitor  Tobacco use -Nicotine patch ordered  Left hand cellulitis  Felt to be probably due to bug bite.   Patient seen by orthopedic physician .  No  evidence of deeper infection that would  require I&D at this time.  Improving with IV antibiotics  DVT proph; Lovenox   CODE STATUS: full  TOTAL TIME TAKING CARE OF THIS PATIENT: 34 minutes.   POSSIBLE D/C unknown, DEPENDING ON CLINICAL CONDITION.   Jasdeep Kepner M.D on 08/09/2019 at 3:37 PM  Between 7am to 6pm - Pager - [phone]  After 6pm go to [email or web address] - password [email or web address] [facility]  Office  [phone]  CC: Primary care physician; Patient, [name] Per  Note: This dictation was prepared with Dragon dictation along with smaller phrase technology. Any transcriptional errors that result from this process are unintentional.

## 2019-08-09 NOTE — Progress Notes (Signed)
Subjective: Left dorsal hand ulceration Patient reports pain as mild in the left hand this morning. No active drainage or fluid collection. Denies any tick bite or history of bug bite. Reports pain has improved to the left hand No significant pain with finger ROM.  Objective: Vital signs in last 24 hours: Temp:  [98.5 F (36.9 C)-99 F (37.2 C)] 98.5 F (36.9 C) (10/29 0400) Pulse Rate:  [94-126] 97 (10/29 0600) Resp:  [12-25] 13 (10/29 0600) BP: (76-138)/(60-97) 94/73 (10/29 0600) SpO2:  [92 %-100 %] 100 % (10/29 0600)  Intake/Output from previous day:  Intake/Output Summary (Last 24 hours) at 08/09/2019 1322 Last data filed at 08/09/2019 0600 Gross per 24 hour  Intake 1528.64 ml  Output 650 ml  Net 878.64 ml    Intake/Output this shift: No intake/output data recorded.  Labs: Recent Labs    08/07/19 0338 08/08/19 0403 08/09/19 0319  HGB 9.7* 9.6* 8.8*   Recent Labs    08/08/19 0403 08/09/19 0319  WBC 8.1 6.1  RBC 4.06* 3.66*  HCT 33.2* 30.6*  PLT 302 262   Recent Labs    08/08/19 0403 08/09/19 0319  NA 139 140  K 3.2* 3.1*  CL 111 111  CO2 21* 21*  BUN <5* <5*  CREATININE 0.39* 0.38*  GLUCOSE 139* 84  CALCIUM 8.3* 8.3*   No results for input(s): LABPT, INR in the last 72 hours.   EXAM General - Patient is alert and appropriate on exam. Extremity - 2 mm area of ulceration to the dorsal aspect of the left hand, between the 5th and 4th metacarpals.  No fluctuance with palpation, mild pain.  Able to flex and extend the 4th and 5th digits with minimal pain.  No purulent drainage or significant erythema noted, ecchymosis present to the hand.  No drainage noted to the hand.  Intact to light touch to the hand, cap refill intact to each digit.  Past Medical History:  Diagnosis Date  . Alcoholism with psychosis with complication, with hallucinations ([place])   . ED (erectile dysfunction)   . Fatigue   . History of pneumonia   . Hypercholesteremia   .  Hypertension   . Lymphoma ([place]) 2008   non hodgkins lymphoma  . Rectal bleeding   . 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   Weight as of this encounter: 73.8 kg.  Swelling and erythema improved this AM, moderate ecchymosis. No active drainage, no fluid collection for I&D. Don't need to keep the hand covered at this time. Activity as tolerated to the left hand.  Raquel Lisha Vitale, PA-C [facility] Orthopaedic Surgery 08/09/2019, 1:22 PM

## 2019-08-09 NOTE — Progress Notes (Signed)
Pharmacy Antibiotic Note  George Betten. is a 63 y.o. male admitted on 07/28/2019 with aspiration pneumonia.  Patient also presents with possible infection of the left hand. Pharmacy has been consulted for Unasyn dosing.   Patient has now completed the 10 day course of antibiotic therapy.   Plan: -Discontinue Unasyn 3g IV q6h (day 10) per rounds.   Height: 5\' 8"  (172.7 cm) Weight: 162 lb 11.2 oz (73.8 kg) IBW/kg (Calculated) : 68.4  Temp (24hrs), Avg:98.8 F (37.1 C), Min:98.5 F (36.9 C), Max:99 F (37.2 C)  Recent Labs  Lab 08/03/19 0539  08/05/19 0412 08/06/19 0503 08/07/19 0338 08/08/19 0403 08/08/19 1021 08/08/19 1256 08/09/19 0319  WBC 8.8  --  9.0  --  8.6 8.1  --   --  6.1  CREATININE 0.59*   < > 0.54* 0.40* 0.48* 0.39*  --   --  0.38*  LATICACIDVEN  --   --   --   --   --   --  1.2 1.1  --    < > = values in this interval not displayed.    Estimated Creatinine Clearance: 91.4 mL/min (A) (by C-G formula based on SCr of 0.38 mg/dL (L)).    Allergies  Allergen Reactions  . Sulfa Antibiotics Nausea Only   Antimicrobials this admission: Unasyn 10/20 >>10/29  Microbiology Results: 10/24 BCx: NGTD 10/20 MRSA PCR: negative 10/17 SARS-CoV-2: negative  Thank you for allowing pharmacy to be a part of this patient's care.  Raiford Simmonds, PharmD Candidate 08/09/2019 12:23 PM

## 2019-08-09 NOTE — Progress Notes (Addendum)
CRITICAL CARE NOTE  CC  follow up pulmonary emboli, esophageal stricture and alcohol withdrawal  SUBJECTIVE Pt condition is improving from yesterday - appears less lethargic and more alert.  Pt is still intermittently disoriented and confused  Remains off of precedex drip  High risk aspiration    BP 94/73   Pulse 97   Temp 98.5 F (36.9 C) (Oral)   Resp 13   Ht 5\' 8"  (1.727 m)   Wt 73.8 kg   SpO2 100%   BMI 24.74 kg/m    I/O last 3 completed shifts: In: 1767.8 [I.V.:721.5; IV Piggyback:1046.2] Out: 3614 [Urine:1250] No intake/output data recorded.  SpO2: 100 % O2 Flow Rate (L/min): 2 L/min FiO2 (%): (!) 2 %   SIGNIFICANT EVENTS 10/17: Presented to the ED with SOB and was admitted to the hospital for pulmonary emboli. He was transferred to the floor and started on a heparin drip. CIWA protocol initiated.  10/18: Attempt to switch patient from heparin drip to eliquis but patient complains of nausea and unable to tolerate anything PO. Pt became irritated and had some mild shaking. CIWA protocol continued. Potassium of 2.8, replacement initiated.  10/19: Pt complaining of difficulty swallowing and vomiting. Placed on IV PPI and made NPO.  10/20: Transferred to ICU for rapid respirations and SpO2 of 93% despite being on nonrebreather. Placed on HFNC at 15L with solumedrol and duoneb treatments. Started on unasyn for aspiration pneumonia. CIWA protocol continued.  10/21: Remains on HFNC. CIWA protocol continued. Precedex infusions for DTs 10/22: DTs treated with precedex. Pt on 2L [place]. GI consulted for endoscopy. Pt underwent endoscopy, severe esophageal stricture found. GI recommended PEG tube placement.  10/23: Pt remains on 2L O2 and precedex drip for DTs. IR consulted, and PEG tube placement scheduled for Monday. Continue lovenox for anticoagulation. Last dose should be Sunday AM. 10/24:Patient is in no distress, able to communicate without encouragement. 10/25: Withdrawal  symptoms with aggitation , patient on precedex now with improvement 10/26: Pt breathing comfortably on room air. He is still having visual hallucinations, and continues on precedex drip. Scheduled to have PEG tube placed today by IR. 10/27: Pt breathing comfortably on room air. Still having visual hallucinations, but is more alert than days before. PEG tube was placed by IR yesterday. Pt tolerated procedure well and can start tube feeds later today. 10/28: Pt is not tolerating tube feeds. Has been nauseous and vomiting. Pt is more weak and lethargic today. Consider intubation.  10/29: Pt presentation is improved today - less lethargic and more alert. Xray of abdomen taken yesterday and had no findings. Pt can be transferred to the floor today   REVIEW OF SYSTEMS  Review of Systems  Constitutional: Negative for chills and fever.  Eyes: Negative for blurred vision.  Respiratory: Negative for cough and shortness of breath.   Cardiovascular: Negative for chest pain, palpitations and leg swelling.  Gastrointestinal: Positive for abdominal pain (Pain near PEG tube placement and in groin. Says it hurts more when he coughs. Reports he had an inguinal hernia as a baby. ). Negative for nausea (Reports none last night or today) and vomiting (Reports none last night or today).  Musculoskeletal:       Complaining of left hand pain  Neurological: Negative for dizziness and headaches.  Psychiatric/Behavioral: Positive for depression.   system  PHYSICAL EXAMINATION:  GENERAL: appears more awake and alert today  HEAD: Normocephalic, atraumatic.  EYES: Pupils equal, round, reactive to light.  No scleral icterus.  MOUTH: Moist mucosal membrane. NECK: Supple.  PULMONARY: CTA bilaterally  CARDIOVASCULAR: S1 and S2. Regular rate and rhythm. No murmurs, rubs, or gallops.  GASTROINTESTINAL: PEG tube in place with dressing over it. Soft, tender, -distended. No masses felt with valsalva. Hypoactive bowel  sounds. No hepatosplenomegaly.  MUSCULOSKELETAL: Left hand edema and erythema is improving overall, but is unchanged from yesterday. Motor and sensory function intact.  NEUROLOGIC: A+O x3. Was aware of his name, the date and the president, but said we were at "mama's house in [place]".  SKIN: intact, warm, dry   MEDICATIONS: I have reviewed all medications and confirmed regimen as documented   CULTURE RESULTS   Recent Results (from the past 240 hour(s))  MRSA PCR Screening     Status: None   Collection Time: 07/31/19  6:20 AM   Specimen: Nasopharyngeal  Result Value Ref Range Status   MRSA by PCR NEGATIVE NEGATIVE Final    Comment:        The GeneXpert MRSA Assay (FDA approved for NASAL specimens only), is one component of a comprehensive MRSA colonization surveillance program. It is not intended to diagnose MRSA infection nor to guide or monitor treatment for MRSA infections. Performed at [facility], [address] [place], [place] [address]   CULTURE, BLOOD (ROUTINE X 2) w Reflex to ID Panel     Status: None   Collection Time: 08/04/19 10:48 AM   Specimen: BLOOD  Result Value Ref Range Status   Specimen Description BLOOD Blood Culture adequate volume  Final   Special Requests   Final    BOTTLES DRAWN AEROBIC AND ANAEROBIC BLOOD RIGHT HAND   Culture   Final    NO GROWTH 5 DAYS Performed at [facility], [address] [place], [place] [address]    Report Status 08/09/2019 FINAL  Final  CULTURE, BLOOD (ROUTINE X 2) w Reflex to ID Panel     Status: None   Collection Time: 08/04/19 10:54 AM   Specimen: BLOOD  Result Value Ref Range Status   Specimen Description   Final    BLOOD Blood Culture results may not be optimal due to an excessive volume of blood received in culture bottles   Special Requests   Final    BOTTLES DRAWN AEROBIC AND ANAEROBIC LEFT ANTECUBITAL   Culture   Final    NO GROWTH 5 DAYS Performed at [facility], [address] [place], [place] [address]    Report Status 08/09/2019 FINAL  Final      CMP Latest Ref Rng & Units 08/09/2019 08/08/2019 08/07/2019  Glucose 70 - 99 mg/dL 84 139(H) 97  BUN 8 - 23 mg/dL <5(L) <5(L) <5(L)  Creatinine 0.61 - 1.24 mg/dL 0.38(L) 0.39(L) 0.48(L)  Sodium 135 - 145 mmol/L 140 139 139  Potassium 3.5 - 5.1 mmol/L 3.1(L) 3.2(L) 3.4(L)  Chloride 98 - 111 mmol/L 111 111 112(H)  CO2 22 - 32 mmol/L 21(L) 21(L) 17(L)  Calcium 8.9 - 10.3 mg/dL 8.3(L) 8.3(L) 8.2(L)  Total Protein 6.5 - 8.1 g/dL - - -  Total Bilirubin 0.3 - 1.2 mg/dL - - -  Alkaline Phos 38 - 126 U/L - - -  AST 15 - 41 U/L - - -  ALT 0 - 44 U/L - - -   CBC    Component Value Date/Time   WBC 6.1 08/09/2019 0319   RBC 3.66 (L) 08/09/2019 0319   HGB 8.8 (L) 08/09/2019 0319   HCT 30.6 (L) 08/09/2019  0319   PLT 262 08/09/2019 0319   MCV 83.6 08/09/2019 0319   MCH 24.0 (L) 08/09/2019 0319   MCHC 28.8 (L) 08/09/2019 0319   RDW 26.3 (H) 08/09/2019 0319   LYMPHSABS 0.9 08/05/2019 0412   MONOABS 0.6 08/05/2019 0412   EOSABS 0.1 08/05/2019 0412   BASOSABS 0.0 08/05/2019 0412        IMAGING    Dg Abd 1 View  Result Date: 08/08/2019 CLINICAL DATA:  Status post PEG placement. EXAM: ABDOMEN - 1 VIEW COMPARISON:  Single-view of the abdomen 07/29/2019. FINDINGS: Feeding tube is identified with its tip projecting over the left aspect of the L2 vertebral body. Contrast in the colon is noted. IMPRESSION: New feeding tube in place. Electronically Signed   By: Inge Rise M.D.   On: 08/08/2019 13:55       Indwelling Urinary Catheter continued, requirement due to   Reason to continue Indwelling Urinary Catheter strict Intake/Output monitoring for hemodynamic instability        ASSESSMENT AND PLAN SYNOPSIS  Severe ACUTE Hypoxic and Hypercapnic Respiratory Failured/t aspiration pneumonia-resolved -Patient is breathing comfortably on room air -Monitor RR, SpO2 and symptoms  -continue unasyn   -high risk aspiration   Alcohol Withdrawal -Therapy with thiamine and MVI -Pt remains off of precedex. Continue valium and ativan prn  -Pt is intermittently confused and disoriented  -Continue CIWA protocol -discontinue LRs   CARDIAC -Continue lovenox injections for anticoagulation  -Switch to xarelto crushed and given through PEG tube  ID -Cellulitis of the left hand.  -continue IV abx as prescibed -follow up cultures  GI -GI PROPHYLAXIS as indicated -Attempt another trial of tube feeds   NUTRITIONAL STATUS -DIET-->NPO -Constipation protocol as indicated  ENDO - will use ICU hypoglycemic\Hyperglycemia protocol if indicated   ELECTROLYTES -follow labs as needed- -replace as needed - replace potassium  -pharmacy consultation and following  Consult PT for patient ambulation    DVT/GI PRX ordered TRANSFUSIONS AS NEEDED MONITOR FSBS ASSESS the need for LABS as needed  Pt condition has improved and can be transferred to the floor      Corrin Parker, M.D.  [facility]  Medical Director [place] Director [place] Department

## 2019-08-09 NOTE — Progress Notes (Signed)
Pharmacy Electrolyte Monitoring Consult:  63 y.o. male admitted on 07/28/2019 with Cough and Chest Pain related to an acute PE.  Because of his alcohol use disorder he remains at a high risk for refeeding syndrome.   Patient has aspirated several times and is unable to take PO meds. He had a PEG tube placed 10/26 and tube feeds were initiated 10/27, but are now being held d/t nausea and vomiting.  10/29: AM labs revealed a K of 3.1. Patient received 10 mEq of IV potassium chloride at 0700.  He will require further potassium supplementation as he has been chronically low x 3 days.  Labs:  Sodium (mmol/L)  Date Value  08/09/2019 140   Potassium (mmol/L)  Date Value  08/09/2019 3.1 (L)   Magnesium (mg/dL)  Date Value  08/09/2019 2.2   Phosphorus (mg/dL)  Date Value  08/09/2019 3.8   Calcium (mg/dL)  Date Value  08/09/2019 8.3 (L)   Albumin (g/dL)  Date Value  08/09/2019 2.2 (L)   Corrected Ca: 9.7 mg/dL  Plan:  Replace potassium with 40 mEq packet q4h x 2 with additional 31mEq at bedtime. (goal ~ 4 mmol/L)  No additional supplementation required for other electrolytes at this time.  Recheck electrolytes tomorrow with AM labs  Pharmacy will continue to follow.  Raiford Simmonds, [place] 08/09/2019 1:56 PM

## 2019-08-10 LAB — BASIC METABOLIC PANEL
Anion gap: 8 (ref 5–15)
BUN: <5 mg/dL — ABNORMAL LOW (ref 8–23)
CO2: 25 mmol/L (ref 22–32)
Calcium: 8.5 mg/dL — ABNORMAL LOW (ref 8.9–10.3)
Chloride: 106 mmol/L (ref 98–111)
Creatinine, Ser: 0.41 mg/dL — ABNORMAL LOW (ref 0.61–1.24)
GFR calc Af Amer: >60 mL/min (ref >60)
GFR calc non Af Amer: >60 mL/min (ref >60)
Glucose, Bld: 101 mg/dL — ABNORMAL HIGH (ref 70–99)
Potassium: 3.9 mmol/L (ref 3.5–5.1)
Sodium: 139 mmol/L (ref 135–145)

## 2019-08-10 LAB — CBC
HCT: 31.4 % — ABNORMAL LOW (ref 39.0–52.0)
Hemoglobin: 9.2 g/dL — ABNORMAL LOW (ref 13.0–17.0)
MCH: 24.3 pg — ABNORMAL LOW (ref 26.0–34.0)
MCHC: 29.3 g/dL — ABNORMAL LOW (ref 30.0–36.0)
MCV: 83.1 fL (ref 80.0–100.0)
Platelets: 305 10*3/uL (ref 150–400)
RBC: 3.78 MIL/uL — ABNORMAL LOW (ref 4.22–5.81)
RDW: 26.1 % — ABNORMAL HIGH (ref 11.5–15.5)
WBC: 7.4 10*3/uL (ref 4.0–10.5)
nRBC: 0 % (ref 0.0–0.2)

## 2019-08-10 MED ORDER — ENOXAPARIN SODIUM 80 MG/0.8ML ~~LOC~~ SOLN
80.0000 mg | Freq: Two times a day (BID) | SUBCUTANEOUS | Status: DC
  Administered 2019-08-11 – 2019-08-12 (×4): 80 mg via SUBCUTANEOUS
  Filled 2019-08-10 (×4): qty 0.8

## 2019-08-10 MED ORDER — SODIUM CHLORIDE 0.9 % IV SOLN
INTRAVENOUS | Status: DC
  Administered 2019-08-11: 01:00:00 via INTRAVENOUS

## 2019-08-10 NOTE — Progress Notes (Addendum)
Pharmacy Electrolyte Monitoring Consult:  63 y.o. male admitted on 07/28/2019 with Cough and Chest Pain related to an acute PE.  Because of his alcohol use disorder he remains at a high risk for refeeding syndrome.   Patient has aspirated several times and is unable to take PO meds. He had a PEG tube placed 10/26 and tube feeds were initiated 10/27, but are now being held d/t nausea and vomiting.   Labs:  Sodium (mmol/L)  Date Value  08/10/2019 139   Potassium (mmol/L)  Date Value  08/10/2019 3.9   Magnesium (mg/dL)  Date Value  08/09/2019 2.2   Phosphorus (mg/dL)  Date Value  08/09/2019 3.8   Calcium (mg/dL)  Date Value  08/10/2019 8.5 (L)   Albumin (g/dL)  Date Value  08/09/2019 2.2 (L)   Corrected Ca: 9.7 mg/dL  Plan:  No additional supplementation required for electrolytes at this time.  Recheck electrolytes tomorrow with AM labs  Pharmacy will continue to follow.  Oswald Hillock, [facility] 08/10/2019 7:36 AM

## 2019-08-10 NOTE — Progress Notes (Signed)
MD Roselyn Reef notified. Pt pulled out PEG tube during report off between NT. RN enters room to find PEG tube on bed, telemetry box in the floor. No new orders at this time. I will continue to assess.

## 2019-08-10 NOTE — Progress Notes (Signed)
Notify Dr. Jannifer Franklin about patient's being NPO and pulled his PEG tube at shift change. Patient takes eliquis for PE, asked if we need to start him on other anticoagulant, Lovenox order place and also started IVF. RN will continue to monitor.

## 2019-08-10 NOTE — Progress Notes (Signed)
ANTICOAGULATION CONSULT NOTE - Initial Consult  Pharmacy Consult for Lovenox Indication: pulmonary embolus  Allergies  Allergen Reactions  . Sulfa Antibiotics Nausea Only    Patient Measurements: Height: 5\' 8"  (172.7 cm) Weight: 162 lb 11.2 oz (73.8 kg) IBW/kg (Calculated) : 68.4  Vital Signs: Temp: 98.2 F (36.8 C) (10/30 1922) Temp Source: Oral (10/30 1922) BP: 131/97 (10/30 1922) Pulse Rate: 102 (10/30 1922)  Labs: Recent Labs    08/08/19 0403 08/09/19 0319 08/10/19 0548  HGB 9.6* 8.8* 9.2*  HCT 33.2* 30.6* 31.4*  PLT 302 262 305  CREATININE 0.39* 0.38* 0.41*    Estimated Creatinine Clearance: 91.4 mL/min (A) (by C-G formula based on SCr of 0.41 mg/dL (L)).   Medical History: Past Medical History:  Diagnosis Date  . Alcoholism with psychosis with complication, with hallucinations ([place])   . ED (erectile dysfunction)   . Fatigue   . History of pneumonia   . Hypercholesteremia   . Hypertension   . Lymphoma ([place]) 2008   non hodgkins lymphoma  . Rectal bleeding   . 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 Rivaroxaban (XARELTO) 15 MG TABS tablet Take 15 mg by mouth 2 (two) times daily with a meal.   07/28/2019 at Unknown time  . rivaroxaban (XARELTO) 20 MG TABS tablet Take 20 mg by mouth daily.   Past Month at unknown   Assessment: Pt on apixaban for PE.  Asked to switch to full dose Lovenox while pt is NPO from pulling his PEG TUBE.  Goal of Therapy:  Anti-Xa level 0.6-1 units/ml 4hrs after LMWH dose given Monitor platelets by anticoagulation protocol: Yes   Plan:  Lovenox 1mg /Kg SQ q12hrs F/U plans to resume Apixaban when appropriate Monitor CBC, Platelets  Abijah Roussel A 08/10/2019,11:03 PM

## 2019-08-10 NOTE — Progress Notes (Signed)
Pt is refusing to wear telemetry box, states that he does not need it. Pt has been impulsive since he came in from ICU, he is alert and oriented x3 with intermittent confusion.

## 2019-08-10 NOTE — Progress Notes (Signed)
George Wade at [place] NAME: George Wade    MR#:  [id number]  DATE OF BIRTH:  09/04/1956  SUBJECTIVE:   Patient reported to have been more confused last night and requiring one-to-one sitter for his safety.  No fevers.  Patient status post PEG tube placement by interventional radiologist on 08/06/2019.  Transferred out of ICU yesterday  REVIEW OF SYSTEMS:  Unobtainable at this time due to medical condition.  DRUG ALLERGIES:   Allergies  Allergen Reactions  . Sulfa Antibiotics Nausea Only    VITALS:  Blood pressure 110/71, pulse (!) 105, temperature 98.7 F (37.1 C), temperature source Oral, resp. rate 20, height 5\' 8"  (1.727 m), weight 73.8 kg, SpO2 90 %.  PHYSICAL EXAMINATION:  Constitutional: Appears well-developed and well-nourished. No distress. HENT: Normocephalic, atraumatic, Oropharynx is clear and moist.  Eyes: Conjunctivae and EOM are normal. PERRLA, no scleral icterus.  Neck: Normal ROM. Neck supple. No JVD. No tracheal deviation. CVS: RRR, S1/S2 +, no murmurs, no gallops, no carotid bruit.  Pulmonary: + Rhonchi present throughout the entire right lung. +diminished breath sounds in the lung bases bilaterally. [place] in place. Abdominal: Soft. BS +,  no distension, +epigastric abdominal pain, no rebound or guarding.  Musculoskeletal: Normal range of motion. No edema and no tenderness.  Neuro: Alert. CN 2-12 grossly intact. No focal deficits. Skin: Skin is warm and dry. No rash noted. Psychiatric: Normal mood and affect.   LABORATORY PANEL:   CBC Recent Labs  Lab 08/10/19 0548  WBC 7.4  HGB 9.2*  HCT 31.4*  PLT 305   ------------------------------------------------------------------------------------------------------------------  Chemistries  Recent Labs  Lab 08/09/19 0319 08/10/19 0548  NA 140 139  K 3.1* 3.9  CL 111 106  CO2 21* 25  GLUCOSE 84 101*  BUN <5* <5*  CREATININE 0.38* 0.41*  CALCIUM 8.3* 8.5*   MG 2.2  --    ------------------------------------------------------------------------------------------------------------------  Cardiac Enzymes No results for input(s): TROPONINI in the last 168 hours. ------------------------------------------------------------------------------------------------------------------  RADIOLOGY:  Dg Abd 1 View  Result Date: 08/08/2019 CLINICAL DATA:  Status post PEG placement. EXAM: ABDOMEN - 1 VIEW COMPARISON:  Single-view of the abdomen 07/29/2019. FINDINGS: Feeding tube is identified with its tip projecting over the left aspect of the L2 vertebral body. Contrast in the colon is noted. IMPRESSION: New feeding tube in place. Electronically Signed   By: Inge Rise M.D.   On: 08/08/2019 13:55     ASSESSMENT AND PLAN:   Acute hypoxic and hypercapnic respiratory failure due to aspiration pneumonia-  Noted to have been weaned off oxygen with documented oxygen saturation of 90% on room air this morning.   Patient was treated with IV Unasyn.Continue duonebs  Dysphagia- having severe aspiration episodes -EGD 10/22 with benign-appearing for esophageal stenosis that was dilated -Strict NPO due to esophageal stenosis Patient status post PEG tube placement by interventional radiologist on 08/06/2019. Tube feeds restarted and tolerating so far.  Gently advance as tolerated  Small right lung pulmonary emboli- seen on CT scan Patient previously placed on therapeutic dose of Lovenox which has been transitioned to Eliquis   Chronic iron deficiency anemia- hemoglobin is lower than baseline. -s/p IV iron x1 on 10/18 -Continue to monitor  DTs with a history of EtOH abuse -Patient weaned off Precedex drip and transferred out of ICU yesterday.  Continue thiamine, folate and multivitamin.  CIWA protocol  Elevated LFTs- likely due to alcohol use.  AST/ALT improved. -Right upper quadrant ultrasound showed hepatic  steatosis -Monitor  Tobacco use -Nicotine  patch ordered  Left hand cellulitis  Felt to be probably due to bug bite.   Patient seen by orthopedic physician .  No evidence of deeper infection that would require I&D at this time.  Was treated with IV Unasyn recently  Debility due to multiple medical problems outlined above. Patient seen by physical therapist.  Recommended skilled nursing facility placement.  Case manager to work on placement  I called patient sister listed in the chart Ms. George Wade, [place].  She confirmed patient has children but he is estranged from his family/children and they are not involved in his care.  Sister also not very highly involved in her care and stated patient usually makes his own medical decisions.  Sister however wishes to be updated with ongoing care of patient and by case managers as well  DVT proph; Lovenox   CODE STATUS: full  TOTAL TIME TAKING CARE OF THIS PATIENT: 33 minutes.   POSSIBLE D/C unknown, DEPENDING ON CLINICAL CONDITION.    George Wade M.D on 08/10/2019 at 1:16 PM  Between 7am to 6pm - Pager - [phone]  After 6pm go to [email or web address] - password [email or web address] [facility]  Office  [phone]  CC: Primary care physician; Patient, [name] Per  Note: This dictation was prepared with Dragon dictation along with smaller phrase technology. Any transcriptional errors that result from this process are unintentional.

## 2019-08-11 DIAGNOSIS — I2699 Other pulmonary embolism without acute cor pulmonale: Principal | ICD-10-CM

## 2019-08-11 DIAGNOSIS — E440 Moderate protein-calorie malnutrition: Secondary | ICD-10-CM

## 2019-08-11 DIAGNOSIS — E512 Wernicke's encephalopathy: Secondary | ICD-10-CM

## 2019-08-11 LAB — BASIC METABOLIC PANEL
Anion gap: 10 (ref 5–15)
BUN: <5 mg/dL — ABNORMAL LOW (ref 8–23)
CO2: 23 mmol/L (ref 22–32)
Calcium: 8.4 mg/dL — ABNORMAL LOW (ref 8.9–10.3)
Chloride: 106 mmol/L (ref 98–111)
Creatinine, Ser: 0.59 mg/dL — ABNORMAL LOW (ref 0.61–1.24)
GFR calc Af Amer: >60 mL/min (ref >60)
GFR calc non Af Amer: >60 mL/min (ref >60)
Glucose, Bld: 85 mg/dL (ref 70–99)
Potassium: 3.6 mmol/L (ref 3.5–5.1)
Sodium: 139 mmol/L (ref 135–145)

## 2019-08-11 LAB — URINALYSIS, ROUTINE W REFLEX MICROSCOPIC
Bilirubin Urine: NEGATIVE
Glucose, UA: NEGATIVE mg/dL
Hgb urine dipstick: NEGATIVE
Ketones, ur: 20 mg/dL — AB
Leukocytes,Ua: NEGATIVE
Nitrite: NEGATIVE
Protein, ur: NEGATIVE mg/dL
Specific Gravity, Urine: 1.008 (ref 1.005–1.030)
pH: 7 (ref 5.0–8.0)

## 2019-08-11 LAB — CBC
HCT: 31 % — ABNORMAL LOW (ref 39.0–52.0)
Hemoglobin: 9.3 g/dL — ABNORMAL LOW (ref 13.0–17.0)
MCH: 24.2 pg — ABNORMAL LOW (ref 26.0–34.0)
MCHC: 30 g/dL (ref 30.0–36.0)
MCV: 80.7 fL (ref 80.0–100.0)
Platelets: 349 10*3/uL (ref 150–400)
RBC: 3.84 MIL/uL — ABNORMAL LOW (ref 4.22–5.81)
RDW: 26.5 % — ABNORMAL HIGH (ref 11.5–15.5)
WBC: 8.4 10*3/uL (ref 4.0–10.5)
nRBC: 0 % (ref 0.0–0.2)

## 2019-08-11 LAB — MAGNESIUM: Magnesium: 2 mg/dL (ref 1.7–2.4)

## 2019-08-11 LAB — PHOSPHORUS: Phosphorus: 3.4 mg/dL (ref 2.5–4.6)

## 2019-08-11 MED ORDER — ADULT MULTIVITAMIN W/MINERALS CH: 1.0000 | ORAL_TABLET | Freq: Every day | ORAL | Status: DC

## 2019-08-11 MED ORDER — THIAMINE HCL 100 MG/ML IJ SOLN
100.0000 mg | Freq: Every day | INTRAMUSCULAR | Status: DC
  Administered 2019-08-11 – 2019-08-13 (×3): 100 mg via INTRAVENOUS
  Filled 2019-08-11 (×3): qty 2

## 2019-08-11 MED ORDER — HALOPERIDOL LACTATE 5 MG/ML IJ SOLN
2.0000 mg | Freq: Two times a day (BID) | INTRAMUSCULAR | Status: DC
  Administered 2019-08-11 – 2019-08-12 (×3): 2 mg via INTRAVENOUS
  Filled 2019-08-11 (×4): qty 1

## 2019-08-11 MED ORDER — LORAZEPAM 1 MG PO TABS: 1.0000 mg | ORAL_TABLET | ORAL | Status: AC | PRN

## 2019-08-11 MED ORDER — LORAZEPAM 2 MG/ML IJ SOLN
1.0000 mg | INTRAMUSCULAR | Status: AC | PRN
  Administered 2019-08-11 – 2019-08-12 (×4): 2 mg via INTRAVENOUS
  Administered 2019-08-12: 4 mg via INTRAVENOUS
  Administered 2019-08-12 – 2019-08-14 (×8): 2 mg via INTRAVENOUS
  Filled 2019-08-11 (×6): qty 1
  Filled 2019-08-11: qty 2
  Filled 2019-08-11 (×5): qty 1

## 2019-08-11 MED ORDER — SODIUM CHLORIDE 0.9 % IV SOLN
INTRAVENOUS | Status: DC
  Administered 2019-08-11 – 2019-08-13 (×3): via INTRAVENOUS

## 2019-08-11 MED ORDER — VITAMIN B-1 100 MG PO TABS: 100.0000 mg | ORAL_TABLET | Freq: Every day | ORAL | Status: DC

## 2019-08-11 MED ORDER — LORAZEPAM 2 MG/ML IJ SOLN
0.0000 mg | Freq: Two times a day (BID) | INTRAMUSCULAR | Status: AC
  Administered 2019-08-14: 3 mg via INTRAVENOUS
  Administered 2019-08-15: 2 mg via INTRAVENOUS
  Filled 2019-08-11: qty 1
  Filled 2019-08-11: qty 2

## 2019-08-11 MED ORDER — FOLIC ACID 5 MG/ML IJ SOLN
1.0000 mg | Freq: Every day | INTRAMUSCULAR | Status: DC
  Administered 2019-08-11 – 2019-08-13 (×3): 1 mg via INTRAVENOUS
  Filled 2019-08-11 (×4): qty 0.2

## 2019-08-11 MED ORDER — LORAZEPAM 2 MG/ML IJ SOLN
0.0000 mg | Freq: Four times a day (QID) | INTRAMUSCULAR | Status: AC
  Administered 2019-08-11: 2 mg via INTRAVENOUS
  Administered 2019-08-12: 4 mg via INTRAVENOUS
  Administered 2019-08-12: 2 mg via INTRAVENOUS
  Administered 2019-08-12: 4 mg via INTRAVENOUS
  Filled 2019-08-11: qty 2
  Filled 2019-08-11: qty 1
  Filled 2019-08-11: qty 2
  Filled 2019-08-11 (×2): qty 1

## 2019-08-11 MED ORDER — FOLIC ACID 1 MG PO TABS: 1.0000 mg | ORAL_TABLET | Freq: Every day | ORAL | Status: DC

## 2019-08-11 NOTE — Plan of Care (Signed)
Pt becoming increasingly agitated, impulsive, hallucinating, speaking inappropriately, and urinating on self.  Psychiatry evaluated pt and has changed medications.  Medicated with Haldol and ativan for pt's agitation and escalating CIWA score.   Problem: Education: Goal: Knowledge of General Education information will improve Description: Including pain rating scale, medication(s)/side effects and non-pharmacologic comfort measures Outcome: Not Progressing   Problem: Health Behavior/Discharge Planning: Goal: Ability to manage health-related needs will improve Outcome: Not Progressing   Problem: Clinical Measurements: Goal: Ability to maintain clinical measurements within normal limits will improve Outcome: Not Progressing Goal: Will remain free from infection Outcome: Not Progressing Goal: Diagnostic test results will improve Outcome: Not Progressing Goal: Respiratory complications will improve Outcome: Not Progressing Goal: Cardiovascular complication will be avoided Outcome: Not Progressing   Problem: Activity: Goal: Risk for activity intolerance will decrease Outcome: Not Progressing   Problem: Nutrition: Goal: Adequate nutrition will be maintained Outcome: Not Progressing   Problem: Coping: Goal: Level of anxiety will decrease Outcome: Not Progressing   Problem: Elimination: Goal: Will not experience complications related to bowel motility Outcome: Not Progressing Goal: Will not experience complications related to urinary retention Outcome: Not Progressing   Problem: Pain Managment: Goal: General experience of comfort will improve Outcome: Not Progressing   Problem: Safety: Goal: Ability to remain free from injury will improve Outcome: Not Progressing   Problem: Skin Integrity: Goal: Risk for impaired skin integrity will decrease Outcome: Not Progressing

## 2019-08-11 NOTE — Progress Notes (Signed)
Pharmacy Electrolyte Monitoring Consult:  63 y.o. male admitted on 07/28/2019 with Cough and Chest Pain related to an acute PE.  Because of his alcohol use disorder he remains at a high risk for refeeding syndrome.   Patient has aspirated several times and is unable to take PO meds. He had a PEG tube placed 10/26 and tube feeds were initiated 10/27, but are now being held d/t nausea and vomiting.   Labs:  Sodium (mmol/L)  Date Value  08/11/2019 139   Potassium (mmol/L)  Date Value  08/11/2019 3.6   Magnesium (mg/dL)  Date Value  08/11/2019 2.0   Phosphorus (mg/dL)  Date Value  08/11/2019 3.4   Calcium (mg/dL)  Date Value  08/11/2019 8.4 (L)   Albumin (g/dL)  Date Value  08/09/2019 2.2 (L)   Corrected Ca:  Plan: K 3.6 Mag 2.0 Phos 3.4 Scr 0.59   No additional supplementation required for electrolytes at this time. Patient pulled out PEG tube on 10/30, NPO currently  Recheck electrolytes tomorrow with AM labs  Pharmacy will continue to follow.  George Wade A, [facility] 08/11/2019 10:06 AM

## 2019-08-11 NOTE — Consult Note (Signed)
[place] Psychiatry Consult   Reason for Consult:  Alcohol abuse/dependence Referring Physician:  Dr Bridgett Larsson Patient Identification: George Wade. MRN:  [id number] Principal Diagnosis: Alcohol-induced mood disorder ([place]) Diagnosis:  Principal Problem:   Alcohol-induced mood disorder (HCC) Active Problems:   Wernicke's encephalopathy   Alcohol dependence with uncomplicated intoxication ([place])   Pulmonary emboli (HCC)   Nausea & vomiting   Malnutrition of moderate degree   Problems with swallowing and mastication   Stricture and stenosis of esophagus  Total Time spent with patient: 1 hour  Subjective:   George Wade. is a 63 y.o. male patient admitted with PE.  "I'm trying to get out of bed."  Patient seen and evaluated in person by this provider.  He was actively psychotic on assessment, seeing people and things that were not there.  Anxious and trying to get out of bed, difficult to redirect.  He was detox from alcohol earlier on admission but Valium was started.  This was discontinued and patient placed on Ativan detox protocol again due to benzo use.  Confused earlier and pulled out his PEG tube.  Haldol 2 mg IV twice daily ordered for agitation and psychosis.  UA ordered as he had a UTI earlier this month and now has a change in cognition.  HPI per MD:  George Wade. is a 63 y.o. male patient complains of cough and chest pain for 2 weeks.  Cough started first and then the chest pain afterwards.  Told triage that the chest pain is constant the center of his chest.  He said it radiates down his left arm.  Patient tells me he has no chest pain in his chest unless he swallows any pain from the bottom of his neck down into the sternum.  It is only when he swallows and he when he swallows anything liquids or solids.  It does not hurt when he coughs.  He does not have chest pain otherwise.  He is not short of breath.  He says the cough is productive of small amounts of clear  phlegm.  He does not think he has been running a fever.  Patient has been homeless and living in his car.  Past Psychiatric History: alcohol dependence, depression, anxiety  Risk to Self:  none Risk to Others:  none Prior Inpatient Therapy:  multiple detoxs Prior Outpatient Therapy:  yes  Past Medical History:  Past Medical History:  Diagnosis Date  . Alcoholism with psychosis with complication, with hallucinations ([place])   . ED (erectile dysfunction)   . Fatigue   . History of pneumonia   . Hypercholesteremia   . Hypertension   . Lymphoma ([place]) 2008   non hodgkins lymphoma  . Rectal bleeding   . 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 ESOPHAGOGASTRODUODENOSCOPY N/A 05/18/2019   Procedure: ESOPHAGOGASTRODUODENOSCOPY (EGD);  Surgeon: Lin Landsman, MD;  Location: [facility] ENDOSCOPY;  Service: Gastroenterology;  Laterality: N/A;  . ESOPHAGOGASTRODUODENOSCOPY (EGD) WITH PROPOFOL N/A 08/02/2019   Procedure: ESOPHAGOGASTRODUODENOSCOPY (EGD) WITH PROPOFOL;  Surgeon: [name], MD;  Location: [facility] ENDOSCOPY;  Service: Endoscopy;  Laterality: N/A;  . [place]  . IR GASTROSTOMY TUBE MOD SED  08/06/2019  . JOINT REPLACEMENT    . REPLACEMENT TOTAL KNEE BILATERAL  2009  . TONSILLECTOMY  2008   with neck dissection    Family History:  Family  History  Problem Relation Age of Onset  . Cancer Mother   . Hypertension Father   . Cancer Paternal Grandmother    Family Psychiatric  History: none Social History:  Social History   Substance and Sexual Activity  Alcohol Use Yes  . Alcohol/week: 5.0 standard drinks  . Types: 5 Glasses of wine per week  . Frequency: Never     Social History   Substance and Sexual Activity  Drug Use No    Social History   Socioeconomic History  . Marital status: Divorced    Spouse name: Not on file  . Number of children: Not on file  . Years of education:  Not on file  . Highest education level: Not on file  Occupational History  . Not on file  Social Needs  . Financial resource strain: Not on file  . Food insecurity    Worry: Not on file    Inability: Not on file  . Transportation needs    Medical: Not on file    Non-medical: Not on file  Tobacco Use  . Smoking status: Never Smoker  . Smokeless tobacco: Never Used  Substance and Sexual Activity  . Alcohol use: Yes    Alcohol/week: 5.0 standard drinks    Types: 5 Glasses of wine per week    Frequency: Never  . Drug use: No  . Sexual activity: Not on file  Lifestyle  . Physical activity    Days per week: Not on file    Minutes per session: Not on file  . Stress: Not on file  Relationships  . Social Herbalist on phone: Not on file    Gets together: Not on file    Attends religious service: Not on file    Active member of club or organization: Not on file    Attends meetings of clubs or organizations: Not on file    Relationship status: Not on file  Other Topics Concern  . Not on file  Social History Narrative  . Not on file   Additional Social History:    Allergies:   Allergies  Allergen Reactions  . Sulfa Antibiotics Nausea Only    Labs:  Results for orders placed or performed during the hospital encounter of 07/28/19 (from the past 48 hour(s))  Basic metabolic panel     Status: Abnormal   Collection Time: 08/10/19  5:48 AM  Result Value Ref Range   Sodium 139 135 - 145 mmol/L   Potassium 3.9 3.5 - 5.1 mmol/L   Chloride 106 98 - 111 mmol/L   CO2 25 22 - 32 mmol/L   Glucose, Bld 101 (H) 70 - 99 mg/dL   BUN <5 (L) 8 - 23 mg/dL   Creatinine, Ser 0.41 (L) 0.61 - 1.24 mg/dL   Calcium 8.5 (L) 8.9 - 10.3 mg/dL   GFR calc non Af Amer >60 >60 mL/min   GFR calc Af Amer >60 >60 mL/min   Anion gap 8 5 - 15    Comment: Performed at [facility], [address] [place], [place] [address]  CBC     Status: Abnormal   Collection Time: 08/10/19   5:48 AM  Result Value Ref Range   WBC 7.4 4.0 - 10.5 K/uL   RBC 3.78 (L) 4.22 - 5.81 MIL/uL   Hemoglobin 9.2 (L) 13.0 - 17.0 g/dL   HCT 31.4 (L) 39.0 - 52.0 %   MCV 83.1 80.0 - 100.0 fL   MCH  24.3 (L) 26.0 - 34.0 pg   MCHC 29.3 (L) 30.0 - 36.0 g/dL   RDW 26.1 (H) 11.5 - 15.5 %   Platelets 305 150 - 400 K/uL   nRBC 0.0 0.0 - 0.2 %    Comment: Performed at [facility], [address] [place], [place] [address]  Basic metabolic panel     Status: Abnormal   Collection Time: 08/11/19  5:35 AM  Result Value Ref Range   Sodium 139 135 - 145 mmol/L   Potassium 3.6 3.5 - 5.1 mmol/L   Chloride 106 98 - 111 mmol/L   CO2 23 22 - 32 mmol/L   Glucose, Bld 85 70 - 99 mg/dL   BUN <5 (L) 8 - 23 mg/dL   Creatinine, Ser 0.59 (L) 0.61 - 1.24 mg/dL   Calcium 8.4 (L) 8.9 - 10.3 mg/dL   GFR calc non Af Amer >60 >60 mL/min   GFR calc Af Amer >60 >60 mL/min   Anion gap 10 5 - 15    Comment: Performed at [facility], [address] [place], [place] [address]  Phosphorus     Status: None   Collection Time: 08/11/19  5:35 AM  Result Value Ref Range   Phosphorus 3.4 2.5 - 4.6 mg/dL    Comment: Performed at [facility], [address] [place], [place] [address]  CBC     Status: Abnormal   Collection Time: 08/11/19  5:35 AM  Result Value Ref Range   WBC 8.4 4.0 - 10.5 K/uL   RBC 3.84 (L) 4.22 - 5.81 MIL/uL   Hemoglobin 9.3 (L) 13.0 - 17.0 g/dL   HCT 31.0 (L) 39.0 - 52.0 %   MCV 80.7 80.0 - 100.0 fL   MCH 24.2 (L) 26.0 - 34.0 pg   MCHC 30.0 30.0 - 36.0 g/dL   RDW 26.5 (H) 11.5 - 15.5 %   Platelets 349 150 - 400 K/uL   nRBC 0.0 0.0 - 0.2 %    Comment: Performed at [facility], [address] [place], [place] [address]  Magnesium     Status: None   Collection Time: 08/11/19  5:35 AM  Result Value Ref Range   Magnesium 2.0 1.7 - 2.4 mg/dL    Comment: Performed at [facility], [address] [place], [place] [address]    Current  Facility-Administered Medications  Medication Dose Route Frequency Provider Last Rate Last Dose  . 0.9 %  sodium chloride infusion  250 mL Intravenous PRN Demetrios Loll, MD   Stopped at 07/29/19 1654  . 0.9 %  sodium chloride infusion   Intravenous Continuous Pyreddy, Pavan, MD      . acetaminophen (TYLENOL) tablet 650 mg  650 mg Oral Q6H PRN Demetrios Loll, MD       Or  . acetaminophen (TYLENOL) suppository 650 mg  650 mg Rectal Q6H PRN Demetrios Loll, MD      . albuterol (PROVENTIL) (2.5 MG/3ML) 0.083% nebulizer solution 2.5 mg  2.5 mg Nebulization Q2H PRN Demetrios Loll, MD   2.5 mg at 08/04/19 1012  . bisacodyl (DULCOLAX) EC tablet 5 mg  5 mg Oral Daily PRN Demetrios Loll, MD      . Chlorhexidine Gluconate Cloth 2 % PADS 6 each  6 each Topical Daily Awilda Bill, NP   6 each at 08/10/19 1000  . enoxaparin (LOVENOX) injection 80 mg  80 mg Subcutaneous Q12H Hart Robinsons A, RPH   80 mg at 08/11/19  1112  . feeding supplement (OSMOLITE 1.5 CAL) liquid 1,000 mL  1,000 mL Per Tube Continuous Flora Lipps, MD 20 mL/hr at 08/10/19 0517 1,000 mL at 08/10/19 0517  . feeding supplement (PRO-STAT SUGAR FREE 64) liquid 30 mL  30 mL Per Tube Daily Flora Lipps, MD   30 mL at 08/10/19 1000  . folic acid injection 1 mg  1 mg Intravenous Daily Pyreddy, Pavan, MD      . free water 150 mL  150 mL Per Tube Q4H Flora Lipps, MD   150 mL at 08/10/19 1615  . guaiFENesin-dextromethorphan (ROBITUSSIN DM) 100-10 MG/5ML syrup 5 mL  5 mL Oral Q4H PRN Lance Coon, MD      . HYDROcodone-acetaminophen (NORCO/VICODIN) 5-325 MG per tablet 1-2 tablet  1-2 tablet Oral Q4H PRN Demetrios Loll, MD   2 tablet at 08/10/19 1813  . LORazepam (ATIVAN) injection 0-4 mg  0-4 mg Intravenous Q6H Patrecia Pour, NP       Followed by  . [START ON 08/13/2019] LORazepam (ATIVAN) injection 0-4 mg  0-4 mg Intravenous Q12H Patrecia Pour, NP      . LORazepam (ATIVAN) tablet 1-4 mg  1-4 mg Oral Q1H PRN Patrecia Pour, NP       Or  . LORazepam (ATIVAN) injection  1-4 mg  1-4 mg Intravenous Q1H PRN Patrecia Pour, NP      . metoprolol tartrate (LOPRESSOR) injection 5 mg  5 mg Intravenous Q6H PRN Lance Coon, MD   5 mg at 07/30/19 2314  . morphine 2 MG/ML injection 2 mg  2 mg Intravenous Q4H PRN Flora Lipps, MD   2 mg at 08/11/19 0309  . multivitamin with minerals tablet 1 tablet  1 tablet Oral Daily Patrecia Pour, NP      . ondansetron [facility]) tablet 4 mg  4 mg Oral Q6H PRN Demetrios Loll, MD       Or  . ondansetron [facility]) injection 4 mg  4 mg Intravenous Q6H PRN Demetrios Loll, MD   4 mg at 08/08/19 1630  . pantoprazole (PROTONIX) injection 40 mg  40 mg Intravenous Q12H Mayo, Pete Pelt, MD   40 mg at 08/11/19 1112  . promethazine (PHENERGAN) injection 25 mg  25 mg Intravenous Q6H PRN Bettey Costa, MD   25 mg at 08/08/19 0934  . senna-docusate (Senokot-S) tablet 1 tablet  1 tablet Oral QHS PRN Demetrios Loll, MD      . sodium chloride flush (NS) 0.9 % injection 3 mL  3 mL Intravenous Q12H Demetrios Loll, MD   3 mL at 08/10/19 2227  . sodium chloride flush (NS) 0.9 % injection 3 mL  3 mL Intravenous PRN Demetrios Loll, MD      . thiamine (VITAMIN B-1) tablet 100 mg  100 mg Oral Daily Patrecia Pour, NP       Or  . thiamine (B-1) injection 100 mg  100 mg Intravenous Daily Patrecia Pour, NP        Musculoskeletal: Strength & Muscle Tone: decreased Gait & Station: did not witness Patient leans: N/A  Psychiatric Specialty Exam: Physical Exam  Nursing note and vitals reviewed. Constitutional: He appears well-developed and well-nourished.  HENT:  Head: Normocephalic.  Neck: Normal range of motion.  Respiratory: Effort normal.  Musculoskeletal: Normal range of motion.  Neurological: He is alert.  Psychiatric: His speech is normal. Thought content normal. His mood appears anxious. His affect is blunt. He is agitated and actively hallucinating. Cognition  and memory are impaired. He expresses impulsivity and inappropriate judgment.    Review of Systems   Constitutional: Negative.   HENT: Negative.   Eyes: Negative.   Respiratory: Negative.   Cardiovascular: Negative.   Gastrointestinal: Negative.   Genitourinary: Negative.   Musculoskeletal: Negative.   Skin: Negative.   Neurological: Negative.   Endo/Heme/Allergies: Negative.   Psychiatric/Behavioral: Positive for hallucinations and substance abuse. The patient is nervous/anxious.   All other systems reviewed and are negative.   Blood pressure (!) 144/89, pulse (!) 116, temperature 99.1 F (37.3 C), temperature source Oral, resp. rate 17, height 5\' 8"  (1.727 m), weight 73.8 kg, SpO2 94 %.Body mass index is 24.74 kg/m.  General Appearance: Casual  Eye Contact:  Fair  Speech:  Normal Rate  Volume: Normal  Mood: Anxious  Affect:  Congruent  Thought Process: Rambling, tangential  Orientation: Person only  Thought Content: Visual hallucinations  Suicidal Thoughts:  No  Homicidal Thoughts:  No  Memory:  Immediate;   Fair Recent;   Fair Remote;   Fair  Judgement: Impaired  Insight: Lacking  Psychomotor Activity: Increased  Concentration: Poor  Recall: Poor  Fund of Knowledge:  Fair  Language:  Good  Akathisia:  No  Handed:  Right  AIMS (if indicated):     Assets:  Leisure Time Resilience  ADL's:  Intact  Cognition: Impaired  Sleep:        Treatment Plan Summary: Daily contact with patient to assess and evaluate symptoms and progress in treatment, Medication management and Plan alcohol dependence with complicated withdrawal:  -Start Ativan detox protocol for recent benzo use -Discontinue Valium  Agitation and psychosis: -Started Haldol 2 mg IV twice daily  Delirium: Patient likely has ongoing delirium which presents as fluctuating cognition.    The patient's exam is notable for altered sensorium, perceptual disturbances, disorientation and cognitive deficits that appear markedly different than their baseline, suggesting a diagnosis of delirium.  Virtually any  medical condition or physiologic stress can precipitate delirium in a susceptible individual, with risk increasing in those with: advanced age, sensory impairments, organic brain disease (stroke, dementia, Parkinsons), psychiatric illness, major chronic medical issues, prolonged hospitalizations, postoperative status, anemia, insomnia/disturbed sleep, and severe pain. Addressing the underlying medical condition and institution of preventative measures are recommended.  - Continue to monitor and treat underlying medical causes of delirium, including infection,    electrolyte disturbances, etc. - Delirium precautions - Minimize/avoid deliriogenic meds including: anticholinergic, opiates, benzodiazepines           - Maintain hydration, oxygenation, nutrition           - Limit use of restraints and catheters           - Normalize sleep patterns by minimizing nighttime noise, light and interruptions by     -Ensure sleep apnea treatment is provided overnight.             clustering care, opening blinds during the day           - Reorient the patient frequently, provide easily visible clock and calendar           - Provide sensory aids like glasses, hearing aids           - Encourage ambulation, regular activities and visitors to maintain cognitive               stimulation   -Patient would benefit from having family members at bedside to reinforce his  orientation.  Disposition: Supportive therapy provided about ongoing stressors.  Waylan Boga, NP 08/11/2019 12:15 PM

## 2019-08-11 NOTE — Progress Notes (Addendum)
[facility] [place] NAME: George Wade    MR#:  [id number]  DATE OF BIRTH:  [birth year]  SUBJECTIVE:   Patient seen and evaluated today Patient pulled out PEG tube last evening Currently n.p.o. and on IV fluids Confused not completely oriented  REVIEW OF SYSTEMS:  Unobtainable at this time due to medical condition.  DRUG ALLERGIES:   Allergies  Allergen Reactions  . Sulfa Antibiotics Nausea Only    VITALS:  Blood pressure (!) 144/89, pulse (!) 116, temperature 99.1 F (37.3 C), temperature source Oral, resp. rate 17, height 5\' 8"  (1.727 m), weight 73.8 kg, SpO2 94 %.  PHYSICAL EXAMINATION:  Constitutional: Middle-aged male patient lying on the bed HENT: Normocephalic, atraumatic, Oropharynx is clear and moist.  Eyes: Conjunctivae and EOM are normal. PERRLA, no scleral icterus.  Neck: Normal ROM. Neck supple. No JVD. No tracheal deviation. CVS: RRR, S1/S2 +, no murmurs, no gallops, no carotid bruit.  Pulmonary: + Rhonchi present throughout the entire right lung. +diminished breath sounds in the lung bases bilaterally. [place] in place. Abdominal: Soft. BS +,  no distension, +epigastric abdominal pain, no rebound or guarding.  Musculoskeletal: Normal range of motion. No edema and no tenderness.  Neuro: Alert. CN 2-12 grossly intact.  Not completely oriented to time place and person. Skin: Skin is warm and dry. No rash noted. Psychiatric: Normal mood and affect.   LABORATORY PANEL:   CBC Recent Labs  Lab 08/11/19 0535  WBC 8.4  HGB 9.3*  HCT 31.0*  PLT 349   ------------------------------------------------------------------------------------------------------------------  Chemistries  Recent Labs  Lab 08/11/19 0535  NA 139  K 3.6  CL 106  CO2 23  GLUCOSE 85  BUN <5*  CREATININE 0.59*  CALCIUM 8.4*  MG 2.0    ------------------------------------------------------------------------------------------------------------------  Cardiac Enzymes No results for input(s): TROPONINI in the last 168 hours. ------------------------------------------------------------------------------------------------------------------  RADIOLOGY:  No results found.   ASSESSMENT AND PLAN:  63 year-old male patient with history of hyperlipidemia hypertension non-Hodgkin's lymphoma tonsillar cancer, alcoholism, psychosis currently under hospitalist service   -Acute hypoxic and hypercapnic respiratory failure due to aspiration pneumonia-  Noted to have been weaned off oxygen with documented oxygen saturation of 90% on room air this morning.   Patient was treated with IV Unasyn.Continue duonebs nebulization therapy for now  -Dysphagia- having severe aspiration episodes -EGD 10/22 with benign-appearing for esophageal stenosis that was dilated -Strict NPO due to esophageal stenosis Patient status post PEG tube placement by interventional radiologist on 08/06/2019. Patient has pulled out PEG tube on 08/10/2019 in the evening Currently n.p.o. Withagain touch base with interventional radiology for placement of PEG tube  Small right lung pulmonary emboli- seen on CT scan Patient unable to receive Eliquis has PEG tube has been pulled out Currently anticoagulation with subcu Lovenox full dose  Chronic iron deficiency anemia- hemoglobin is lower than baseline. -s/p IV iron x1 on 10/18 -Continue to monitor  DTs with a history of EtOH abuse -Patient weaned off Precedex drip and transferred out of ICU yesterday.  Continue thiamine, folate and multivitamin.  CIWA protocol Psychiatry consult for evaluation of psychosis  Elevated LFTs- likely due to alcohol use.  AST/ALT improved. -Right upper quadrant ultrasound showed hepatic steatosis -Monitor  Tobacco use -Nicotine patch ordered  -Left hand cellulitis  Felt to be  probably due to bug bite.   Patient seen by orthopedic physician .  No evidence of deeper infection that would require I&D at this time.  Was treated with IV Unasyn recently, clinical monitoring  Debility due to multiple medical problems outlined above. Patient seen by physical therapist.  Recommended skilled nursing facility placement.  Case manager to work on placement   patient sister listed in the chart Ms. Laurann [place], [place].  She confirmed patient has children but he is estranged from his family/children and they are not involved in his care.  Sister also not very highly involved in her care and stated patient usually makes his own medical decisions.  Sister however wishes to be updated with ongoing care of patient and by case managers as well  DVT proph; Lovenox   CODE STATUS: full  TOTAL TIME TAKING CARE OF THIS PATIENT: 35 minutes.   POSSIBLE D/C unknown, DEPENDING ON CLINICAL CONDITION.    Saundra Shelling M.D on 08/11/2019 at 10:10 AM  Between 7am to 6pm - Pager - [phone]  After 6pm go to [email or web address] - password [email or web address] [place] [facility]  Office  [phone]  CC: Primary care physician; Patient, [name] Per  Note: This dictation was prepared with Dragon dictation along with smaller phrase technology. Any transcriptional errors that result from this process are unintentional.

## 2019-08-11 NOTE — Plan of Care (Signed)
  Problem: Education: Goal: Knowledge of General Education information will improve Description: Including pain rating scale, medication(s)/side effects and non-pharmacologic comfort measures Outcome: Not Progressing   Problem: Health Behavior/Discharge Planning: Goal: Ability to manage health-related needs will improve Outcome: Not Progressing   Problem: Activity: Goal: Risk for activity intolerance will decrease Outcome: Not Progressing   Problem: Nutrition: Goal: Adequate nutrition will be maintained Outcome: Not Progressing  Pt is NPO with esophogeal stenosis. Pulled Peg tube out Problem: Safety: Goal: Ability to remain free from injury will improve Outcome: Not Progressing  Pt agitated and anxious trying to get out of bed. Pt going through DT's

## 2019-08-11 NOTE — Progress Notes (Signed)
Pt pulled peg tube out prior shift. Pt has been NPO throughout the evening.

## 2019-08-12 DIAGNOSIS — D509 Iron deficiency anemia, unspecified: Secondary | ICD-10-CM

## 2019-08-12 DIAGNOSIS — I2601 Septic pulmonary embolism with acute cor pulmonale: Secondary | ICD-10-CM

## 2019-08-12 MED ORDER — HALOPERIDOL LACTATE 5 MG/ML IJ SOLN
2.0000 mg | Freq: Four times a day (QID) | INTRAMUSCULAR | Status: DC | PRN
  Administered 2019-08-14 – 2019-08-21 (×2): 2 mg via INTRAVENOUS
  Filled 2019-08-12 (×2): qty 1

## 2019-08-12 MED ORDER — ENOXAPARIN SODIUM 80 MG/0.8ML ~~LOC~~ SOLN
1.0000 mg/kg | Freq: Two times a day (BID) | SUBCUTANEOUS | Status: DC
  Administered 2019-08-12 – 2019-08-16 (×7): 70 mg via SUBCUTANEOUS
  Filled 2019-08-12 (×7): qty 0.8

## 2019-08-12 MED ORDER — HALOPERIDOL LACTATE 5 MG/ML IJ SOLN
2.0000 mg | Freq: Once | INTRAMUSCULAR | Status: AC
  Administered 2019-08-12: 2 mg via INTRAVENOUS

## 2019-08-12 NOTE — Plan of Care (Signed)
Pt remains agitated, impulsive, irritable, climbing out of bed and pulling at lines/wires.  Telesitter in use and bed alarms turned on.  Frequent doses of ativan IV required for agitation, and additional dose of haldol IV given.   Problem: Education: Goal: Knowledge of General Education information will improve Description: Including pain rating scale, medication(s)/side effects and non-pharmacologic comfort measures Outcome: Progressing   Problem: Health Behavior/Discharge Planning: Goal: Ability to manage health-related needs will improve Outcome: Progressing   Problem: Clinical Measurements: Goal: Ability to maintain clinical measurements within normal limits will improve Outcome: Progressing Goal: Will remain free from infection Outcome: Progressing Goal: Diagnostic test results will improve Outcome: Progressing Goal: Respiratory complications will improve Outcome: Progressing Goal: Cardiovascular complication will be avoided Outcome: Progressing   Problem: Activity: Goal: Risk for activity intolerance will decrease Outcome: Progressing   Problem: Nutrition: Goal: Adequate nutrition will be maintained Outcome: Progressing   Problem: Coping: Goal: Level of anxiety will decrease Outcome: Progressing   Problem: Elimination: Goal: Will not experience complications related to bowel motility Outcome: Progressing Goal: Will not experience complications related to urinary retention Outcome: Progressing   Problem: Pain Managment: Goal: General experience of comfort will improve Outcome: Progressing   Problem: Safety: Goal: Ability to remain free from injury will improve Outcome: Progressing   Problem: Skin Integrity: Goal: Risk for impaired skin integrity will decrease Outcome: Progressing

## 2019-08-12 NOTE — Progress Notes (Signed)
Pharmacy Electrolyte Monitoring Consult:  63 y.o. male admitted on 07/28/2019 with Cough and Chest Pain related to an acute PE.  Because of his alcohol use disorder he remains at a high risk for refeeding syndrome.   Patient has aspirated several times and is unable to take PO meds. He had a PEG tube placed 10/26 and tube feeds were initiated 10/27, but are now being held d/t nausea and vomiting.   Labs:  Sodium (mmol/L)  Date Value  08/11/2019 139   Potassium (mmol/L)  Date Value  08/11/2019 3.6   Magnesium (mg/dL)  Date Value  08/11/2019 2.0   Phosphorus (mg/dL)  Date Value  08/11/2019 3.4   Calcium (mg/dL)  Date Value  08/11/2019 8.4 (L)   Albumin (g/dL)  Date Value  08/09/2019 2.2 (L)   Corrected Ca:  Plan:  No additional supplementation required for electrolytes at this time. Patient pulled out PEG tube on 10/30, NPO currently-MD to touch base with interventional radiology for placement of PEG tube  Recheck electrolytes tomorrow with AM labs  Pharmacy will continue to follow.  Lariyah Shetterly A, [facility] 08/12/2019 10:31 AM

## 2019-08-12 NOTE — Progress Notes (Signed)
ANTICOAGULATION CONSULT NOTE -   Pharmacy Consult for Lovenox Indication: pulmonary embolus  Allergies  Allergen Reactions  . Sulfa Antibiotics Nausea Only    Patient Measurements: Height: 5\' 8"  (172.7 cm) Weight: 157 lb 11.2 oz (71.5 kg) IBW/kg (Calculated) : 68.4  Vital Signs: Temp: 98.2 F (36.8 C) (11/01 0713) Temp Source: Oral (11/01 0713) BP: 125/89 (11/01 0713) Pulse Rate: 93 (11/01 0713)  Labs: Recent Labs    08/10/19 0548 08/11/19 0535  HGB 9.2* 9.3*  HCT 31.4* 31.0*  PLT 305 349  CREATININE 0.41* 0.59*    Estimated Creatinine Clearance: 91.4 mL/min (A) (by C-G formula based on SCr of 0.59 mg/dL (L)).   Medical History: Past Medical History:  Diagnosis Date  . Alcoholism with psychosis with complication, with hallucinations ([place])   . ED (erectile dysfunction)   . Fatigue   . History of pneumonia   . Hypercholesteremia   . Hypertension   . Lymphoma ([place]) 2008   non hodgkins lymphoma  . Rectal bleeding   . 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 Rivaroxaban (XARELTO) 15 MG TABS tablet Take 15 mg by mouth 2 (two) times daily with a meal.   07/28/2019 at Unknown time  . rivaroxaban (XARELTO) 20 MG TABS tablet Take 20 mg by mouth daily.   Past Month at unknown   Assessment: Pt on apixaban for PE.  Asked to switch to full dose Lovenox while pt is NPO from pulling his PEG TUBE.  Goal of Therapy:  Monitor platelets by anticoagulation protocol: Yes   Plan:  Lovenox 1mg /Kg SQ q12hrs F/U plans to resume Apixaban when appropriate Monitor CBC, Platelets  Blane Worthington A 08/12/2019,10:39 AM
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
## 2019-08-13 LAB — PHOSPHORUS: Phosphorus: 3.2 mg/dL (ref 2.5–4.6)

## 2019-08-13 LAB — BASIC METABOLIC PANEL
Anion gap: 9 (ref 5–15)
BUN: <5 mg/dL — ABNORMAL LOW (ref 8–23)
CO2: 20 mmol/L — ABNORMAL LOW (ref 22–32)
Calcium: 8.3 mg/dL — ABNORMAL LOW (ref 8.9–10.3)
Chloride: 110 mmol/L (ref 98–111)
Creatinine, Ser: 0.45 mg/dL — ABNORMAL LOW (ref 0.61–1.24)
GFR calc Af Amer: >60 mL/min (ref >60)
GFR calc non Af Amer: >60 mL/min (ref >60)
Glucose, Bld: 73 mg/dL (ref 70–99)
Potassium: 3.4 mmol/L — ABNORMAL LOW (ref 3.5–5.1)
Sodium: 139 mmol/L (ref 135–145)

## 2019-08-13 LAB — MAGNESIUM: Magnesium: 1.9 mg/dL (ref 1.7–2.4)

## 2019-08-13 MED ORDER — FREE WATER: 30.0000 mL | Status: DC

## 2019-08-13 MED ORDER — POTASSIUM CHLORIDE IN NACL 40-0.9 MEQ/L-% IV SOLN
INTRAVENOUS | Status: DC
  Administered 2019-08-13 (×2): 75 mL/h via INTRAVENOUS
  Filled 2019-08-13 (×4): qty 1000

## 2019-08-13 MED ORDER — FOLIC ACID 1 MG PO TABS
1.0000 mg | ORAL_TABLET | Freq: Every day | ORAL | Status: DC
  Administered 2019-08-19 – 2019-08-29 (×11): 1 mg
  Filled 2019-08-13 (×14): qty 1

## 2019-08-13 MED ORDER — OSMOLITE 1.5 CAL PO LIQD: 1000.0000 mL | ORAL | Status: DC

## 2019-08-13 MED ORDER — VITAMIN B-1 100 MG PO TABS
100.0000 mg | ORAL_TABLET | Freq: Every day | ORAL | Status: DC
  Administered 2019-08-19 – 2019-08-29 (×11): 100 mg
  Filled 2019-08-13 (×13): qty 1

## 2019-08-13 NOTE — Progress Notes (Signed)
Patient sleeping, thus will not perform CIWA assessment. Will continue to monitor withdrawal status. Wenda Low [facility]

## 2019-08-13 NOTE — Progress Notes (Signed)
Physical Therapy Treatment Patient Details Name: George Wade. MRN: [id number] DOB: 02-01-1956 Today's Date: 08/13/2019    History of Present Illness Pt admitted for chest pain and now diagnosed with alcohol induced mood disorder. History includes alcohol abuse and HTN. Hospital stay complicated by transfer to CCU secondary to respiratory distress on 07/31/19 and then PEG tube placement on 10/26 secondary to severe esophageal stricture. Pt now pulled out PEG tube and has been agitated with RN staff.    PT Comments    Pt is making gradual progress towards goals with pt motivated to want to walk to chair. Bed soiled upon arrival. PT and RN assisted in ambulating to recliner and performing hygiene. Good tolerance with there-ex, fatigues quickly. Will continue to progress as able. Chair alarm on, RN aware that pt in recliner.  Follow Up Recommendations  SNF     Equipment Recommendations  Rolling walker with 5" wheels    Recommendations for Other Services       Precautions / Restrictions Precautions Precautions: Fall Restrictions Weight Bearing Restrictions: No    Mobility  Bed Mobility Overal bed mobility: Needs Assistance Bed Mobility: Supine to Sit     Supine to sit: Mod assist     General bed mobility comments: needs assist for sliding B LEs off bed. Heavy assist for trunk control. Once seated at EOB, R side leaning present.   Transfers Overall transfer level: Needs assistance Equipment used: Rolling walker (2 wheeled) Transfers: Sit to/from Stand Sit to Stand: Mod assist;+2 physical assistance         General transfer comment: follows commands, needs assist to keep from feet sliding. Once standing, able to stand while RN performed personal hygiene.  Ambulation/Gait Ambulation/Gait assistance: Mod assist;+2 physical assistance Gait Distance (Feet): 5 Feet Assistive device: Rolling walker (2 wheeled) Gait Pattern/deviations: Step-to pattern     General  Gait Details: ambulated with side stepping. Small step noted with R LE. Fatigues quickly   Stairs             Wheelchair Mobility    Modified Rankin (Stroke Patients Only)       Balance Overall balance assessment: Needs assistance;History of Falls Sitting-balance support: Feet supported Sitting balance-Leahy Scale: Fair     Standing balance support: Bilateral upper extremity supported Standing balance-Leahy Scale: Poor Standing balance comment: heavy R side leaning                            Cognition Arousal/Alertness: Awake/alert Behavior During Therapy: Flat affect Overall Cognitive Status: Impaired/Different from baseline                                 General Comments: disoriented       Exercises Other Exercises Other Exercises: B LE seated ther-ex performed including AP, alt. marching, LAQ, hip abd/add, and hip add squeezes. ALl ther-ex performed x 10 reps with min/mod assist    General Comments        Pertinent Vitals/Pain Pain Assessment: No/denies pain    Home Living                      Prior Function            PT Goals (current goals can now be found in the care plan section) Acute Rehab PT Goals Patient Stated Goal: to get stronger PT Goal Formulation:  With patient Time For Goal Achievement: 08/23/19 Potential to Achieve Goals: Good Progress towards PT goals: Progressing toward goals    Frequency    Min 2X/week      PT Plan Current plan remains appropriate    Co-evaluation              AM-PAC PT "6 Clicks" Mobility   Outcome Measure  Help needed turning from your back to your side while in a flat bed without using bedrails?: A Lot Help needed moving from lying on your back to sitting on the side of a flat bed without using bedrails?: A Lot Help needed moving to and from a bed to a chair (including a wheelchair)?: A Lot Help needed standing up from a chair using your arms (e.g.,  wheelchair or bedside chair)?: A Lot Help needed to walk in hospital room?: Total Help needed climbing 3-5 steps with a railing? : Total 6 Click Score: 10    End of Session Equipment Utilized During Treatment: Gait belt Activity Tolerance: Patient tolerated treatment well Patient left: in chair;with chair alarm set Nurse Communication: Mobility status PT Visit Diagnosis: Muscle weakness (generalized) (M62.81);Difficulty in walking, not elsewhere classified (R26.2);History of falling (Z91.81);Pain;Unsteadiness on feet (R26.81)     Time: 1027-2536 PT Time Calculation (min) (ACUTE ONLY): 23 min  Charges:  $Gait Training: 8-22 mins $Therapeutic Exercise: 8-22 mins                     Greggory Stallion, PT, DPT [phone]    George Wade 08/13/2019, 10:38 AM

## 2019-08-13 NOTE — Progress Notes (Signed)
Follow-up on hand infection. He has some subcutaneous clot with some serosanguineous drainage but no evidence of purulent drainage.  With his poor post conditions with poor nutrition think this will probably take some time to heal but at present I do not think it is danger for need of surgery.

## 2019-08-13 NOTE — Progress Notes (Signed)
Patient has been resting comfortably all afternoon. Rounded several times. Will continue to monitor. Wenda Low [facility]
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
## 2019-08-13 NOTE — Progress Notes (Addendum)
Pharmacy Electrolyte Monitoring Consult:  63 y.o. male admitted on 07/28/2019 with Cough and Chest Pain related to an acute PE.  Because of his alcohol use disorder he remains at a high risk for refeeding syndrome.   Patient has aspirated several times and is unable to take PO meds. He had a PEG tube placed 10/26 and tube feeds were initiated 10/27, but are now being held d/t nausea and vomiting.   Labs:  Sodium (mmol/L)  Date Value  08/13/2019 139   Potassium (mmol/L)  Date Value  08/13/2019 3.4 (L)   Magnesium (mg/dL)  Date Value  08/13/2019 1.9   Phosphorus (mg/dL)  Date Value  08/13/2019 3.2   Calcium (mg/dL)  Date Value  08/13/2019 8.3 (L)   Albumin (g/dL)  Date Value  08/09/2019 2.2 (L)   Corrected Ca: 9.74  Plan:  Will change IVF to NS w/ KCL 84meq (this will provide 72 mEq potassium in 24 hours).     Patient pulled out PEG tube on 10/30,2.2 NPO currently-MD to touch base with interventional radiology for placement of PEG tube  Recheck electrolytes tomorrow with AM labs  Pharmacy will continue to follow.  Lu Duffel, PharmD, BCPS Clinical Pharmacist 08/13/2019 7:19 AM

## 2019-08-13 NOTE — Plan of Care (Signed)
  Problem: Education: Goal: Knowledge of General Education information will improve Description: Including pain rating scale, medication(s)/side effects and non-pharmacologic comfort measures Outcome: Not Progressing Note: Patient confused and impulsive earlier today, telesitter failed. Received dose of Ativan, per report. Now sleeping. Will continue to monitor neurological status. Wenda Low [facility]

## 2019-08-14 ENCOUNTER — Inpatient Hospital Stay: Payer: Self-pay

## 2019-08-14 LAB — CBC
HCT: 35.9 % — ABNORMAL LOW (ref 39.0–52.0)
Hemoglobin: 10.6 g/dL — ABNORMAL LOW (ref 13.0–17.0)
MCH: 24.7 pg — ABNORMAL LOW (ref 26.0–34.0)
MCHC: 29.5 g/dL — ABNORMAL LOW (ref 30.0–36.0)
MCV: 83.5 fL (ref 80.0–100.0)
Platelets: 405 10*3/uL — ABNORMAL HIGH (ref 150–400)
RBC: 4.3 MIL/uL (ref 4.22–5.81)
RDW: 25.6 % — ABNORMAL HIGH (ref 11.5–15.5)
WBC: 9.2 10*3/uL (ref 4.0–10.5)
nRBC: 0 % (ref 0.0–0.2)

## 2019-08-14 LAB — BASIC METABOLIC PANEL
Anion gap: 13 (ref 5–15)
BUN: <5 mg/dL — ABNORMAL LOW (ref 8–23)
CO2: 18 mmol/L — ABNORMAL LOW (ref 22–32)
Calcium: 8.9 mg/dL (ref 8.9–10.3)
Chloride: 107 mmol/L (ref 98–111)
Creatinine, Ser: 0.56 mg/dL — ABNORMAL LOW (ref 0.61–1.24)
GFR calc Af Amer: >60 mL/min (ref >60)
GFR calc non Af Amer: >60 mL/min (ref >60)
Glucose, Bld: 75 mg/dL (ref 70–99)
Potassium: 3.7 mmol/L (ref 3.5–5.1)
Sodium: 138 mmol/L (ref 135–145)

## 2019-08-14 LAB — MAGNESIUM: Magnesium: 1.8 mg/dL (ref 1.7–2.4)

## 2019-08-14 MED ORDER — KETOROLAC TROMETHAMINE 15 MG/ML IJ SOLN
15.0000 mg | Freq: Four times a day (QID) | INTRAMUSCULAR | Status: AC
  Administered 2019-08-14 – 2019-08-15 (×3): 15 mg via INTRAVENOUS
  Filled 2019-08-14 (×3): qty 1

## 2019-08-14 MED ORDER — POTASSIUM CHLORIDE IN NACL 20-0.9 MEQ/L-% IV SOLN
INTRAVENOUS | Status: DC
  Administered 2019-08-14 – 2019-08-15 (×3): via INTRAVENOUS
  Filled 2019-08-14 (×6): qty 1000

## 2019-08-14 MED ORDER — MAGNESIUM SULFATE 2 GM/50ML IV SOLN
2.0000 g | Freq: Once | INTRAVENOUS | Status: AC
  Administered 2019-08-14: 2 g via INTRAVENOUS
  Filled 2019-08-14: qty 50

## 2019-08-14 NOTE — Progress Notes (Signed)
Nutrition Follow Up Note   DOCUMENTATION CODES:   Non-severe (moderate) malnutrition in context of social or environmental circumstances  INTERVENTION:   Once G-tube able to be replaced:  Osmolite 1.5 @50ml /hr- Initiate at 34m/hr and increase by 131mhr q 8 hours until goal rate is reached.   Prostat liquid protein 30 ml daily via tube, each supplement provides 100 kcal, 15 grams protein.  Free water flushes 15063m4 hours    Regimen provides 1900kcal/day, 90g/day protein, [id number] water   Folic acid and thiamine daily in setting of etoh abuse   NUTRITION DIAGNOSIS:   Moderate Malnutrition related to social / environmental circumstances(EtOH abuse, limited access to well-balanced meals) as evidenced by mild-moderate fat depletion, mild-moderate muscle depletion.  GOAL:   Patient will meet greater than or equal to 90% of their needs  -previously met with tube feeds   MONITOR:   Labs, Weight trends, TF tolerance, Skin, I & O's  ASSESSMENT:   [age]ar old male with PMHx of HTN, hx non-Hodgkin's lymphoma, hx tonsillar cancer, EtOH abuse admitted with small right lung pulmonary emboli, chronic iron deficiency anemia, dysphagia.   Pt s/p IR placement of 16 Fr balloon retention gastrostomy tube 10/26  Pt with increasing anxiety and restlessness pulled G-tube 10/30; currently awaiting IR assessment for replacement. Recommend resume tube feeds once G-tube replaced. Per chart, pt down ~5lbs since admit; RD will continue to monitor.   Medications reviewed and include: lovenox, nicotine, protonix, MVI, folic acid & thiamine  Labs reviewed: K 3.7 wnl, BUN <5(L), creat 0.56(L), P 3.2 wnl, Mg 1.8 wnl Hgb 10.6(L), Hct 35.9(L)  Diet Order:   Diet Order            Diet NPO time specified  Diet effective midnight             EDUCATION NEEDS:   No education needs have been identified at this time  Skin:  Skin Assessment: Reviewed RN Assessment  Last BM:   11/1  Height:   Ht Readings from Last 1 Encounters:  08/06/19 5' 8"  (1.727 m)   Weight:   Wt Readings from Last 1 Encounters:  08/13/19 71.7 kg   Ideal Body Weight:  70 kg  BMI:  Body mass index is 24.04 kg/m.  Estimated Nutritional Needs:   Kcal:  1800-2000  Protein:  90-100 grams  Fluid:  1.8-2 L/day  CasKoleen Distance, RD, LDN Pager #- [phone]#- [phone] Hours Pager: [phone]

## 2019-08-14 NOTE — Progress Notes (Signed)
Called by nursing reporting patient found after a ground level fall during which he suffered head trauma. Patient is seen with upper lip laceration from penetration of upper tooth, it appears that tooth did break during fall. Due to current anticoagulation with full dose Lovenox Head and Maxiofacial CT's have been ordered and results currently pending. General surgery consulted for possible need of stitch to upper lip.

## 2019-08-14 NOTE — Progress Notes (Signed)
Pharmacy Electrolyte Monitoring Consult:  63 y.o. male admitted on 07/28/2019 with Cough and Chest Pain related to an acute PE.  Because of his alcohol use disorder he remains at a high risk for refeeding syndrome.   Patient has aspirated several times and is unable to take PO meds. He had a PEG tube placed 10/26 and tube feeds were initiated 10/27, but are now being held d/t nausea and vomiting.   Labs:  Sodium (mmol/L)  Date Value  08/14/2019 138   Potassium (mmol/L)  Date Value  08/14/2019 3.7   Magnesium (mg/dL)  Date Value  08/14/2019 1.8   Phosphorus (mg/dL)  Date Value  08/13/2019 3.2   Calcium (mg/dL)  Date Value  08/14/2019 8.9   Albumin (g/dL)  Date Value  08/09/2019 2.2 (L)   Corrected Ca: 9.74  Plan:  Will reduce IVF to NS w/ KCL 69meq (this will provide 36 mEq potassium in 24 hours)    Will give Mg 2g IV bolus x 1    Patient pulled out PEG tube on 10/30 - NPO currently - will verify PEG tube replacement and adjust electrolyte replenishment if needed    Recheck electrolytes tomorrow with AM labs  Pharmacy will continue to follow.  Lu Duffel, PharmD, BCPS Clinical Pharmacist 08/14/2019 7:14 AM

## 2019-08-14 NOTE — Progress Notes (Signed)
Pt has become increasingly anxious and climbing out of bed more frequently throughout this shift. Has received Ativan 2 mg IV x 3 this shift; he slept for a short time earlier this shift.   He has become increasingly more worried and "needs to cut the wires, and get the shoes". He repeatedly removes telemetry. Safety rounder intermittently at bedside. Pt provided with pericare and linens changed this am. Condom cath changed as he had removed it. Also noted that pt has irritated anal area with lesions, moderate amount of bleeding. Area cleaned and gently dried, barrier cream applied.   Will cont to administer ativan per CIWA and monitor mental status.
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
## 2019-08-15 DIAGNOSIS — R7989 Other specified abnormal findings of blood chemistry: Secondary | ICD-10-CM | POA: Diagnosis not present

## 2019-08-15 DIAGNOSIS — W19XXXA Unspecified fall, initial encounter: Secondary | ICD-10-CM

## 2019-08-15 DIAGNOSIS — K222 Esophageal obstruction: Secondary | ICD-10-CM

## 2019-08-15 LAB — BASIC METABOLIC PANEL
Anion gap: 14 (ref 5–15)
BUN: <5 mg/dL — ABNORMAL LOW (ref 8–23)
CO2: 16 mmol/L — ABNORMAL LOW (ref 22–32)
Calcium: 8.8 mg/dL — ABNORMAL LOW (ref 8.9–10.3)
Chloride: 110 mmol/L (ref 98–111)
Creatinine, Ser: 0.67 mg/dL (ref 0.61–1.24)
GFR calc Af Amer: >60 mL/min (ref >60)
GFR calc non Af Amer: >60 mL/min (ref >60)
Glucose, Bld: 71 mg/dL (ref 70–99)
Potassium: 3.9 mmol/L (ref 3.5–5.1)
Sodium: 140 mmol/L (ref 135–145)

## 2019-08-15 LAB — MAGNESIUM: Magnesium: 2.2 mg/dL (ref 1.7–2.4)

## 2019-08-15 MED ORDER — AMOXICILLIN-POT CLAVULANATE 875-125 MG PO TABS
1.0000 | ORAL_TABLET | Freq: Two times a day (BID) | ORAL | Status: DC
  Filled 2019-08-15: qty 1

## 2019-08-15 MED ORDER — DEXTROSE-NACL 5-0.9 % IV SOLN
INTRAVENOUS | Status: DC
  Administered 2019-08-16 – 2019-08-17 (×3): via INTRAVENOUS

## 2019-08-15 NOTE — Consult Note (Signed)
Pellegrino, Kennard [id number] 1956-02-05 Riley Nearing, MD  Reason for Consult: lip laceration Requesting Physician: Ivor Costa, MD Consulting Physician: Riley Nearing, MD  HPI: This 63 y.o. year old male was admitted on 07/28/2019 for Pulmonary embolism without acute cor pulmonale, unspecified chronicity, unspecified pulmonary embolism type ([place]) [I26.99]. Patient fell last evening sometime after 8PM, lacerating upper lip. CT demonstrated very small calcified density deep in the soft tissues, maybe 48mm in size, which might be a small piece of tooth that broke from fall. General Surgery was called about the laceration and apparently recommendation was to call ENT today. I was consulted around 9AM, more than 12 hours post injury.  Medications:  Current Facility-Administered Medications  Medication Dose Route Frequency Provider Last Rate Last Dose  . 0.9 %  sodium chloride infusion  250 mL Intravenous PRN Demetrios Loll, MD   Stopped at 07/29/19 1654  . 0.9 % NaCl with KCl 20 mEq/ L  infusion   Intravenous Continuous Lu Duffel, RPH 75 mL/hr at 08/15/19 1000    . acetaminophen (TYLENOL) tablet 650 mg  650 mg Oral Q6H PRN Demetrios Loll, MD       Or  . acetaminophen (TYLENOL) suppository 650 mg  650 mg Rectal Q6H PRN Demetrios Loll, MD      . albuterol (PROVENTIL) (2.5 MG/3ML) 0.083% nebulizer solution 2.5 mg  2.5 mg Nebulization Q2H PRN Demetrios Loll, MD   2.5 mg at 08/04/19 1012  . bisacodyl (DULCOLAX) EC tablet 5 mg  5 mg Oral Daily PRN Demetrios Loll, MD      . enoxaparin (LOVENOX) injection 70 mg  1 mg/kg Subcutaneous Q12H Lance Coon, MD   70 mg at 08/15/19 0942  . feeding supplement (OSMOLITE 1.5 CAL) liquid 1,000 mL  1,000 mL Per Tube Continuous Spongberg, Audie Pinto, MD   Stopped at 08/13/19 1233  . feeding supplement (PRO-STAT SUGAR FREE 64) liquid 30 mL  30 mL Per Tube Daily Flora Lipps, MD   30 mL at 08/10/19 1000  . folic acid (FOLVITE) tablet 1 mg  1 mg Per Tube Daily Spongberg,  Audie Pinto, MD      . free water 30 mL  30 mL Per Tube Q4H Spongberg, Audie Pinto, MD      . guaiFENesin-dextromethorphan (ROBITUSSIN DM) 100-10 MG/5ML syrup 5 mL  5 mL Oral Q4H PRN Lance Coon, MD      . haloperidol lactate (HALDOL) injection 2 mg  2 mg Intravenous Q6H PRN Marcell Anger, MD   2 mg at 08/14/19 1714  . HYDROcodone-acetaminophen (NORCO/VICODIN) 5-325 MG per tablet 1-2 tablet  1-2 tablet Oral Q4H PRN Demetrios Loll, MD   2 tablet at 08/10/19 1813  . ketorolac (TORADOL) 15 MG/ML injection 15 mg  15 mg Intravenous Q6H Merlene Laughter F, NP   15 mg at 08/15/19 0457  . LORazepam (ATIVAN) injection 0-4 mg  0-4 mg Intravenous Q12H Patrecia Pour, NP   2 mg at 08/15/19 0157  . metoprolol tartrate (LOPRESSOR) injection 5 mg  5 mg Intravenous Q6H PRN Lance Coon, MD   5 mg at 07/30/19 2314  . morphine 2 MG/ML injection 2 mg  2 mg Intravenous Q4H PRN Flora Lipps, MD   2 mg at 08/15/19 0950  . ondansetron (ZOFRAN) tablet 4 mg  4 mg Oral Q6H PRN Demetrios Loll, MD       Or  . ondansetron [facility]) injection 4 mg  4 mg Intravenous Q6H PRN Demetrios Loll, MD  4 mg at 08/08/19 1630  . pantoprazole (PROTONIX) injection 40 mg  40 mg Intravenous Q12H Mayo, Pete Pelt, MD   40 mg at 08/15/19 0941  . promethazine (PHENERGAN) injection 25 mg  25 mg Intravenous Q6H PRN Bettey Costa, MD   25 mg at 08/08/19 0934  . senna-docusate (Senokot-S) tablet 1 tablet  1 tablet Oral QHS PRN Demetrios Loll, MD      . sodium chloride flush (NS) 0.9 % injection 3 mL  3 mL Intravenous Q12H Demetrios Loll, MD   3 mL at 08/15/19 0942  . sodium chloride flush (NS) 0.9 % injection 3 mL  3 mL Intravenous PRN Demetrios Loll, MD      . thiamine (VITAMIN B-1) tablet 100 mg  100 mg Per Tube Daily Spongberg, Audie Pinto, MD      .  Medications Prior to Admission  Medication Sig Dispense Refill  . Rivaroxaban (XARELTO) 15 MG TABS tablet Take 15 mg by mouth 2 (two) times daily with a meal.    . rivaroxaban (XARELTO) 20 MG TABS tablet  Take 20 mg by mouth daily.      Allergies:  Allergies  Allergen Reactions  . Sulfa Antibiotics Nausea Only    PMH:  Past Medical History:  Diagnosis Date  . Alcoholism with psychosis with complication, with hallucinations ([place])   . ED (erectile dysfunction)   . Fatigue   . History of pneumonia   . Hypercholesteremia   . Hypertension   . Lymphoma ([place]) 2008   non hodgkins lymphoma  . Rectal bleeding   . 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 Cancer Mother   . Hypertension Father   . Cancer Paternal Grandmother     Soc Hx:  Social History   Socioeconomic History  . Marital status: Divorced    Spouse name: Not on file  . Number of children: Not on file  . Years of education: Not on file  . Highest education level: Not on file  Occupational History  . Not on file  Social Needs  . Financial resource strain: Not on file  . Food insecurity    Worry: Not on file    Inability: Not on file  . Transportation needs    Medical: Not on file    Non-medical: Not on file  Tobacco Use  . Smoking status: Never Smoker  . Smokeless tobacco: Never Used  Substance and Sexual Activity  . Alcohol use: Yes    Alcohol/week: 5.0 standard drinks    Types: 5 Glasses of wine per week    Frequency: Never  . Drug use: No  . Sexual activity: Not on file  Lifestyle  . Physical activity    Days per week: Not on file    Minutes per session: Not on file  . Stress: Not on file  Relationships  . Social Herbalist on phone: Not on file    Gets together: Not on file    Attends religious service: Not on file    Active member of club or organization: Not on file    Attends meetings of clubs or organizations: Not on file    Relationship status: Not on file  . Intimate partner violence    Fear of current or ex partner: Not on file    Emotionally abused: Not on file    Physically  abused: Not on  file    Forced sexual activity: Not on file  Other Topics Concern  . Not on file  Social History Narrative  . Not on file    PSH:  Past Surgical History:  Procedure Laterality Date  . ESOPHAGOGASTRODUODENOSCOPY N/A 05/18/2019   Procedure: ESOPHAGOGASTRODUODENOSCOPY (EGD);  Surgeon: Lin Landsman, MD;  Location[facility] ENDOSCOPY;  Service: Gastroenterology;  Laterality: N/A;  . ESOPHAGOGASTRODUODENOSCOPY (EGD) WITH PROPOFOL N/A 08/02/2019   Procedure: ESOPHAGOGASTRODUODENOSCOPY (EGD) WITH PROPOFOL;  Surgeon: [name], MD;  Location: [facility] ENDOSCOPY;  Service: Endoscopy;  Laterality: N/A;  . [place]  . IR GASTROSTOMY TUBE MOD SED  08/06/2019  . JOINT REPLACEMENT    . REPLACEMENT TOTAL KNEE BILATERAL  2009  . TONSILLECTOMY  2008   with neck dissection   . Procedures since admission: No admission procedures for hospital encounter.  ROS: Review of systems normal other than 12 systems except per HPI.  PHYSICAL EXAM  Vitals: Blood pressure 115/89, pulse 92, temperature 98.2 F (36.8 C), temperature source Oral, resp. rate 18, height 5\' 8"  (1.727 m), weight 71.7 kg, SpO2 98 %.. General: Well-developed, Well-nourished in no acute distress Mood: Mood and affect well adjusted, pleasant and cooperative. Orientation: Grossly alert and oriented. Vocal Quality: No hoarseness. Communicates verbally. head and Face: NCAT. No facial asymmetry. No visible skin lesions. No significant facial scars. No tenderness with sinus percussion. Facial strength normal and symmetric. Ears: External ears with normal landmarks, no lesions. External auditory canals free of infection, cerumen impaction or lesions. Tympanic membranes intact with good landmarks and normal mobility on pneumatic otoscopy. No middle ear effusion. Hearing: Speech reception grossly normal. Nose: External nose normal with midline dorsum and no lesions or deformity. Nasal Cavity reveals essentially midline septum with normal  inferior turbinates. No significant mucosal congestion or erythema. Nasal secretions are minimal and clear. No polyps seen on anterior rhinoscopy. Oral Cavity/ Oropharynx: Laceration of the interior of the upper lip with a flap of tissue, partial raised. No bleeding. Poor dentition and incisors broken. Gingiva healthy with no lesions or gingivitis. Oropharynx including tongue, buccal mucosa, floor of mouth, hard and soft palate, uvula and posterior pharynx free of exudates, erythema or lesions with normal symmetry and hydration.  Indirect Laryngoscopy/Nasopharyngoscopy: Visualization of the larynx, hypopharynx and nasopharynx is not possible in this setting with routine examination. Neck: Supple and symmetric with no palpable masses, tenderness or crepitance. The trachea is midline. Thyroid gland is soft, nontender and symmetric with no masses or enlargement. Parotid and submandibular glands are soft, nontender and symmetric, without masses. Lymphatic: Cervical lymph nodes are without palpable lymphadenopathy or tenderness. Respiratory: Normal respiratory effort without labored breathing. Cardiovascular: Carotid pulse shows regular rate and rhythm Neurologic: Cranial Nerves II through XII are grossly intact. Eyes: Gaze and Ocular Motility are grossly normal. PERRLA. No visible nystagmus.  MEDICAL DECISION MAKING: Data Review:  Results for orders placed or performed during the hospital encounter of 07/28/19 (from the past 48 hour(s))  Basic metabolic panel     Status: Abnormal   Collection Time: 08/14/19  4:00 AM  Result Value Ref Range   Sodium 138 135 - 145 mmol/L   Potassium 3.7 3.5 - 5.1 mmol/L   Chloride 107 98 - 111 mmol/L   CO2 18 (L) 22 - 32 mmol/L   Glucose, Bld 75 70 - 99 mg/dL   BUN <5 (L) 8 - 23 mg/dL   Creatinine, Ser 0.56 (L) 0.61 - 1.24 mg/dL  Calcium 8.9 8.9 - 10.3 mg/dL   GFR calc non Af Amer >60 >60 mL/min   GFR calc Af Amer >60 >60 mL/min   Anion gap 13 5 - 15     Comment: Performed at [facility], [address] [place], [place] [address]  CBC     Status: Abnormal   Collection Time: 08/14/19  4:00 AM  Result Value Ref Range   WBC 9.2 4.0 - 10.5 K/uL   RBC 4.30 4.22 - 5.81 MIL/uL   Hemoglobin 10.6 (L) 13.0 - 17.0 g/dL   HCT 35.9 (L) 39.0 - 52.0 %   MCV 83.5 80.0 - 100.0 fL   MCH 24.7 (L) 26.0 - 34.0 pg   MCHC 29.5 (L) 30.0 - 36.0 g/dL   RDW 25.6 (H) 11.5 - 15.5 %   Platelets 405 (H) 150 - 400 K/uL   nRBC 0.0 0.0 - 0.2 %    Comment: Performed at [facility], [address] [place], [place] [address]  Magnesium     Status: None   Collection Time: 08/14/19  4:00 AM  Result Value Ref Range   Magnesium 1.8 1.7 - 2.4 mg/dL    Comment: Performed at [facility], [address] [place], [place] [address]  Basic metabolic panel     Status: Abnormal   Collection Time: 08/15/19  6:00 AM  Result Value Ref Range   Sodium 140 135 - 145 mmol/L   Potassium 3.9 3.5 - 5.1 mmol/L   Chloride 110 98 - 111 mmol/L   CO2 16 (L) 22 - 32 mmol/L   Glucose, Bld 71 70 - 99 mg/dL   BUN <5 (L) 8 - 23 mg/dL   Creatinine, Ser 0.67 0.61 - 1.24 mg/dL   Calcium 8.8 (L) 8.9 - 10.3 mg/dL   GFR calc non Af Amer >60 >60 mL/min   GFR calc Af Amer >60 >60 mL/min   Anion gap 14 5 - 15    Comment: Performed at [facility], [address] [place], [place] [address]  Magnesium     Status: None   Collection Time: 08/15/19  6:00 AM  Result Value Ref Range   Magnesium 2.2 1.7 - 2.4 mg/dL    Comment: Performed at [facility], [address] [place], [place] [address]  . Ct Head Wo Contrast  Result Date: 08/14/2019 CLINICAL DATA:  Initial evaluation for acute trauma, fall. EXAM: CT HEAD WITHOUT CONTRAST TECHNIQUE: Contiguous axial images were obtained from the base of the skull through the vertex without intravenous contrast. COMPARISON:  Prior head CT from 07/17/2019. FINDINGS: Brain: Generalized age-related cerebral atrophy  with mild chronic small vessel ischemic disease. No acute intracranial hemorrhage. No acute large vessel territory infarct. No mass lesion, midline shift or mass effect no hydrocephalus. No extra-axial fluid collection. Vascular: No hyperdense vessel. Scattered vascular calcifications noted within the carotid siphons. Skull: Scalp soft tissues demonstrate no acute finding. Calvarium intact. Sinuses/Orbits: Globes and orbital soft tissues within normal limits. Mild scattered mucosal thickening noted within the ethmoidal air cells and maxillary sinuses. No mastoid effusion. Other: None. IMPRESSION: 1. No acute intracranial abnormality. 2. Age-related cerebral atrophy with mild chronic small vessel ischemic disease, stable. Electronically Signed   By: Jeannine Boga M.D.   On: 08/14/2019 22:41   Ct Maxillofacial Wo Contrast  Result Date: 08/14/2019 CLINICAL DATA:  Darrick Grinder evaluation for acute trauma, fall. EXAM: CT MAXILLOFACIAL WITHOUT CONTRAST TECHNIQUE: Multidetector CT imaging of the maxillofacial structures was performed.  Multiplanar CT image reconstructions were also generated. COMPARISON:  None. FINDINGS: Osseous: Zygomatic arches intact. No acute maxillary fracture. Pterygoid plates intact. Nasal bones intact without fracture. Nasal septum intact. No acute mandibular fracture. Mandibular condyles normally situated. Dentition grossly intact. Orbits: Globes and orbital soft tissues within normal limits. Bony orbits intact. Sinuses: Mild mucoperiosteal thickening present within the ethmoidal air cells and right greater than left maxillary sinuses. Paranasal sinuses are otherwise clear. No hemosinus. Mastoid air cells and middle ear cavities are well pneumatized and free of fluid. Soft tissues: Soft tissue laceration present just to the left of midline at the upper lip. Superimposed radiopaque density could reflect debris/foreign body (series 3, image 59). Again, adjacent dentition grossly intact. No other  visible soft tissue injury about the face. Limited intracranial: Unremarkable. IMPRESSION: 1. Soft tissue laceration just to the left of midline at the upper lip. Superimposed radiopaque density could reflect debris/foreign body. Adjacent dentition grossly intact. 2. No other acute maxillofacial injury identified.  No fracture. Electronically Signed   By: Jeannine Boga M.D.   On: 08/14/2019 22:55  .   ASSESSMENT: Oral cavity lip laceration now 12 hours out from injury with small tooth fragment in depths of wound.   PLAN: Generally wound closures should be completed within 8 hours of injury, otherwise you risk infection, and this is especially the case in an intraoral laceration. I offered the patient the option of numbing the area and probing the depths of the wound to see if I might locate this small dental fragment, and possibly placing a couple of loose stiches to better align this flap of tissue but he refused. Would let this heal by secondary intention. More than likely the small tooth fragment will not cause any issues. Would cover him with Augmentin 875mg  PO BID for 10 days. If there is some resulting step-off deformity in the future after healing, this could be potentially revised.    Riley Nearing, MD 08/15/2019 11:58 AM

## 2019-08-15 NOTE — Progress Notes (Signed)
PROGRESS NOTE    George Wade.  DTO:[id number] DOB: Feb 07, 1956 DOA: 07/28/2019 PCP: Patient, [name] Per    Brief Narrative:   George Wade  is a 63 y.o. male with a known history of new medical problems as below. The patient presents the ED with above chief complaints.  The patient presents the ED with above chief complaints.  He has had chest pain in the central area, which is extirpated by cough and swallowing food.  He also complains of shortness breath and cough.  He has been drinking alcohol on daily basis.  The last drink was yesterday.  He denies any tremors or shaking.  He is found elevated d-dimer and CT angiogram of the chest report right-sided PE.  [name] requested admission and will start heparin drip.  Interim History: 1. Patient fell last evening sometime after 8PM, lacerating upper lip. CT demonstrated very small calcified density deep in the soft tissues, maybe 19mm in size, which might be a small piece of tooth that broke from fall. General Surgery was called about the laceration and apparently recommendation was to call ENT today. [name] was consult 2. Consulted IR, [name]. He states that pt just had PEG tube placement on 10/26. Pt pulled out the tube. It is not appropriate and is risky to put PEG tube again. Pt is likely to pull out the tube again. He recommended to consider other way of feeing pt.    Assessment & Plan:   Principal Problem:   Alcohol-induced mood disorder (HCC) Active Problems:   Wernicke's encephalopathy   Alcohol dependence with uncomplicated intoxication ([place])   Pulmonary emboli (HCC)   Nausea & vomiting   Malnutrition of moderate degree   Problems with swallowing and mastication   Stricture and stenosis of esophagus   Fall   Acute hypoxic and hypercapnic respiratory failure due to aspiration pneumonia-As previously noted,weaned off oxygen with documented oxygen saturation of 97% on room air this morning.   -Patient was treated with IV Unasyn. -Continue duonebsnebulization therapy for now  Dysphagia- having severe aspiration episodes: GI was consulted. EGD 10/22 with benign-appearing for esophageal stenosis that was dilated. Patient status post PEG tube placement by interventional radiologist on 08/06/2019. Patient has pulled out PEG tube on 08/10/2019 in the evening. IR was consulted for PEG placement. [name] said that pt just had PEG tube placement on 10/26. Pt pulled out the tube. It is not appropriate and is risky to put PEG tube again. Pt is likely to pull out the tube again. He recommended to consider other way of feeing pt. TPN? Very difficult situation. I am hoping his mental status improves and then try to put PEG tube again. -will consult to palliative  -will consult nutrition -will start NS-d5 at 75 cc/h -Strict NPO due to esophageal stenosis  Small right lung pulmonary emboli- seen on CT scan. Patient unable to receive Eliquis since PEG tube has been pulled out -Currently anticoagulation with subcu Lovenox full dose -Readdress p.o. anticoagulation after placement of PEG  Chronic iron deficiency anemia- hemoglobin is lower than baseline. Hgb 9.3, 8.8,10.6  -s/p IV iron x1 on 10/18 -Continue to monitor -Stable currently   DTs with history of EtOH abuse, and alcohol-induced mood disorder and wernicke's encephalopathy:  Patient weaned off Precedex drip and transferred out of ICU. Psychiatry consultedfor evaluation of psychosis-appreciate their input-request that they reevaluate secondary to behavioral nonadherence versus delirium. Psychiatry wrote for Haldol as needed on initial consultation -Continue  thiamine, folate and multivitamin.  -CIWA protocol  Elevated LFTs- likely due to alcohol use. AST/ALT improved. Right upper quadrant ultrasound showed hepatic steatosis -Monitor  Tobacco use -Nicotine patch ordered  Fall with lip laceration: Oral cavity lip laceration  now 12 hours out from injury with small tooth fragment in depths of wound.  Dr. Gilda Crease from ENT was consulted. "I offered the patient the option of numbing the area and probing the depths of the wound to see if I might locate this small dental fragment, and possibly placing a couple of loose stiches to better align this flap of tissue but he refused". -appreciate [name] consultation. -start Augmentin.   PLAN: Generally wound closures should be completed within 8 hours of injury, otherwise you risk infection, and this is especially the case in an intraoral laceration.  Would let this heal by secondary intention. More than likely the small tooth fragment will not cause any issues. Would cover him with Augmentin 875mg  PO BID for 10 days. If there is some resulting step-off deformity in the future after healing, this could be potentially revised.    Left hand cellulitis : Felt to be probably due to bug bite. Patient seen by orthopedic physician . No evidence of deeper infection that would require I&D at this time. Was treated with IV Unasyn recently,clinical monitoring. Appreciate their follow-up.   DVT prophylaxis: on Lovenos Code Status: fall Family Communication: none, not at bedside Disposition Plan: not ready for discharge. Still need to solve feeding issues due to severe dysphagia. Barriers for discharge:    Consultants:   ENT  psychiatry  Procedures:  EGD  Antimicrobials: Anti-infectives (From admission, onward)   Start     Dose/Rate Route Frequency Ordered Stop   08/15/19 1215  amoxicillin-clavulanate (AUGMENTIN) 875-125 MG per tablet 1 tablet     1 tablet Oral Every 12 hours 08/15/19 1212 08/25/19 0959   08/06/19 1415  ceFAZolin (ANCEF) IVPB 2g/100 mL premix     2 g 200 mL/hr over 30 Minutes Intravenous  Once 08/06/19 1403 08/06/19 1530   08/06/19 1406  ceFAZolin (ANCEF) 2-4 GM/100ML-% IVPB    Note to Pharmacy: Fransico Michael   : cabinet override      08/06/19 1406  08/07/19 0214   08/03/19 2000  fluconazole (DIFLUCAN) IVPB 200 mg  Status:  Discontinued     200 mg 100 mL/hr over 60 Minutes Intravenous Every 24 hours 08/03/19 1954 08/07/19 1115   07/31/19 0630  Ampicillin-Sulbactam (UNASYN) 3 g in sodium chloride 0.9 % 100 mL IVPB  Status:  Discontinued     3 g 200 mL/hr over 30 Minutes Intravenous Every 6 hours 07/31/19 0620 08/09/19 1011         Subjective:  pt feels hungry. Has some facial pain after fall. Pt is intermittently confused.  Objective: Vitals:   08/15/19 1000 08/15/19 1407 08/15/19 1734 08/15/19 2116  BP: 115/89 109/86 (!) 117/91 (!) 121/93  Pulse: 92 93 96 100  Resp: 18 20 18 18   Temp: 98.2 F (36.8 C) (!) 97.5 F (36.4 C) 98 F (36.7 C)   TempSrc: Oral Oral Oral   SpO2: 98% 96% 96% 98%  Weight:      Height:        Intake/Output Summary (Last 24 hours) at 08/15/2019 2317 Last data filed at 08/15/2019 1744 Gross per 24 hour  Intake 1008.15 ml  Output 450 ml  Net 558.15 ml   Filed Weights   08/06/19 1355 08/12/19 0500 08/13/19 0631  Weight: 73.8 kg 71.5 kg 71.7 kg    Examination: Physical Exam:  General: Not in acute distress HEENT: PERRL, EOMI, no scleral icterus, No JVD or bruit. Has laceration of the interior of the upper lip with a flap of tissue. Cardiac: S1/S2, RRR, No murmurs, gallops or rubs Pulm: Clear to auscultation bilaterally. No rales, wheezing, rhonchi or rubs. Abd: Soft, nondistended, nontender, no rebound pain, no organomegaly, BS present Ext: No edema. 2+DP/PT pulse bilaterally Musculoskeletal: No joint deformities, erythema, or stiffness, ROM full Skin: No rashes.  Neuro: intermittently confused, currently oriented X3, cranial nerves II-XII grossly intact. Psych: Patient is not psychotic, no suicidal or hemocidal ideation.    Data Reviewed: I have personally reviewed following labs and imaging studies  CBC: Recent Labs  Lab 08/09/19 0319 08/10/19 0548 08/11/19 0535 08/14/19 0400   WBC 6.1 7.4 8.4 9.2  HGB 8.8* 9.2* 9.3* 10.6*  HCT 30.6* 31.4* 31.0* 35.9*  MCV 83.6 83.1 80.7 83.5  PLT 262 305 349 322*   Basic Metabolic Panel: Recent Labs  Lab 08/09/19 0319 08/10/19 0548 08/11/19 0535 08/13/19 0448 08/14/19 0400 08/15/19 0600  NA 140 139 139 139 138 140  K 3.1* 3.9 3.6 3.4* 3.7 3.9  CL 111 106 106 110 107 110  CO2 21* 25 23 20* 18* 16*  GLUCOSE 84 101* 85 73 75 71  BUN <5* <5* <5* <5* <5* <5*  CREATININE 0.38* 0.41* 0.59* 0.45* 0.56* 0.67  CALCIUM 8.3* 8.5* 8.4* 8.3* 8.9 8.8*  MG 2.2  --  2.0 1.9 1.8 2.2  PHOS 3.8  --  3.4 3.2  --   --    GFR: Estimated Creatinine Clearance: 91.4 mL/min (by C-G formula based on SCr of 0.67 mg/dL). Liver Function Tests: Recent Labs  Lab 08/09/19 0319  ALBUMIN 2.2*   No results for input(s): LIPASE, AMYLASE in the last 168 hours. No results for input(s): AMMONIA in the last 168 hours. Coagulation Profile: No results for input(s): INR, PROTIME in the last 168 hours. Cardiac Enzymes: No results for input(s): CKTOTAL, CKMB, CKMBINDEX, TROPONINI in the last 168 hours. BNP (last 3 results) No results for input(s): PROBNP in the last 8760 hours. HbA1C: No results for input(s): HGBA1C in the last 72 hours. CBG: No results for input(s): GLUCAP in the last 168 hours. Lipid Profile: No results for input(s): CHOL, HDL, LDLCALC, TRIG, CHOLHDL, LDLDIRECT in the last 72 hours. Thyroid Function Tests: No results for input(s): TSH, T4TOTAL, FREET4, T3FREE, THYROIDAB in the last 72 hours. Anemia Panel: No results for input(s): VITAMINB12, FOLATE, FERRITIN, TIBC, IRON, RETICCTPCT in the last 72 hours. Sepsis Labs: No results for input(s): PROCALCITON, LATICACIDVEN in the last 168 hours.  No results found for this or any previous visit (from the past 240 hour(s)).   RN Pressure Injury Documentation:     Estimated body mass index is 24.04 kg/m as calculated from the following:   Height as of this encounter: 5\' 8"  (1.727  m).   Weight as of this encounter: 71.7 kg.  Malnutrition Type:  Nutrition Problem: Moderate Malnutrition Etiology: social / environmental circumstances(EtOH abuse, limited access to well-balanced meals)   Malnutrition Characteristics:  Signs/Symptoms: mild fat depletion, moderate fat depletion, mild muscle depletion, moderate muscle depletion   Nutrition Interventions:  Interventions: Refer to RD note for recommendations        Radiology Studies: Ct Head Wo Contrast  Result Date: 08/14/2019 CLINICAL DATA:  Initial evaluation for acute trauma, fall. EXAM: CT HEAD WITHOUT CONTRAST TECHNIQUE:  Contiguous axial images were obtained from the base of the skull through the vertex without intravenous contrast. COMPARISON:  Prior head CT from 07/17/2019. FINDINGS: Brain: Generalized age-related cerebral atrophy with mild chronic small vessel ischemic disease. No acute intracranial hemorrhage. No acute large vessel territory infarct. No mass lesion, midline shift or mass effect no hydrocephalus. No extra-axial fluid collection. Vascular: No hyperdense vessel. Scattered vascular calcifications noted within the carotid siphons. Skull: Scalp soft tissues demonstrate no acute finding. Calvarium intact. Sinuses/Orbits: Globes and orbital soft tissues within normal limits. Mild scattered mucosal thickening noted within the ethmoidal air cells and maxillary sinuses. No mastoid effusion. Other: None. IMPRESSION: 1. No acute intracranial abnormality. 2. Age-related cerebral atrophy with mild chronic small vessel ischemic disease, stable. Electronically Signed   By: Jeannine Boga M.D.   On: 08/14/2019 22:41   Ct Maxillofacial Wo Contrast  Result Date: 08/14/2019 CLINICAL DATA:  Darrick Grinder evaluation for acute trauma, fall. EXAM: CT MAXILLOFACIAL WITHOUT CONTRAST TECHNIQUE: Multidetector CT imaging of the maxillofacial structures was performed. Multiplanar CT image reconstructions were also generated.  COMPARISON:  None. FINDINGS: Osseous: Zygomatic arches intact. No acute maxillary fracture. Pterygoid plates intact. Nasal bones intact without fracture. Nasal septum intact. No acute mandibular fracture. Mandibular condyles normally situated. Dentition grossly intact. Orbits: Globes and orbital soft tissues within normal limits. Bony orbits intact. Sinuses: Mild mucoperiosteal thickening present within the ethmoidal air cells and right greater than left maxillary sinuses. Paranasal sinuses are otherwise clear. No hemosinus. Mastoid air cells and middle ear cavities are well pneumatized and free of fluid. Soft tissues: Soft tissue laceration present just to the left of midline at the upper lip. Superimposed radiopaque density could reflect debris/foreign body (series 3, image 59). Again, adjacent dentition grossly intact. No other visible soft tissue injury about the face. Limited intracranial: Unremarkable. IMPRESSION: 1. Soft tissue laceration just to the left of midline at the upper lip. Superimposed radiopaque density could reflect debris/foreign body. Adjacent dentition grossly intact. 2. No other acute maxillofacial injury identified.  No fracture. Electronically Signed   By: Jeannine Boga M.D.   On: 08/14/2019 22:55        Scheduled Meds: . amoxicillin-clavulanate  1 tablet Oral Q12H  . enoxaparin (LOVENOX) injection  1 mg/kg Subcutaneous Q12H  . feeding supplement (PRO-STAT SUGAR FREE 64)  30 mL Per Tube Daily  . folic acid  1 mg Per Tube Daily  . free water  30 mL Per Tube Q4H  . pantoprazole (PROTONIX) IV  40 mg Intravenous Q12H  . sodium chloride flush  3 mL Intravenous Q12H  . thiamine  100 mg Per Tube Daily   Continuous Infusions: . sodium chloride Stopped (07/29/19 1654)  . 0.9 % NaCl with KCl 20 mEq / L 75 mL/hr at 08/15/19 1207  . feeding supplement (OSMOLITE 1.5 CAL) Stopped (08/13/19 1233)     LOS: 18 days    Time spent: [place], DO [facility] PAGER is on [place]  If 7PM-7AM, please contact night-coverage [email or web address] Password [email or web address] 08/15/2019, 11:17 PM

## 2019-08-15 NOTE — Progress Notes (Signed)
Pharmacy Electrolyte Monitoring Consult:  63 y.o. male admitted on 07/28/2019 with Cough and Chest Pain related to an acute PE.  Because of his alcohol use disorder he remains at a high risk for refeeding syndrome.   Patient has aspirated several times and is unable to take PO meds. He had a PEG tube placed 10/26 and tube feeds were initiated 10/27, but are now being held d/t nausea and vomiting.  Patient pulled out PEG tube on 10/30 - NPO currently - will verify   PEG tube replacement and adjust electrolyte replenishment if needed.   Labs:  Sodium (mmol/L)  Date Value  08/15/2019 140   Potassium (mmol/L)  Date Value  08/15/2019 3.9   Magnesium (mg/dL)  Date Value  08/15/2019 2.2   Phosphorus (mg/dL)  Date Value  08/13/2019 3.2   Calcium (mg/dL)  Date Value  08/15/2019 8.8 (L)   Albumin (g/dL)  Date Value  08/09/2019 2.2 (L)   Plan:  Continue NS w/ KCL 59meq (this will provide 36 mEq potassium in 24 hours)    No magnesium replenishment warranted   Recheck electrolytes tomorrow with AM labs  Pharmacy will continue to follow.  Lu Duffel, PharmD, BCPS Clinical Pharmacist 08/15/2019 7:17 AM

## 2019-08-15 NOTE — Progress Notes (Signed)
Physical Therapy Treatment Patient Details Name: George Wade. MRN: [id number] DOB: 18-Dec-1955 Today's Date: 08/15/2019    History of Present Illness Pt admitted for chest pain and now diagnosed with alcohol induced mood disorder. History includes alcohol abuse and HTN. Hospital stay complicated by transfer to CCU secondary to respiratory distress on 07/31/19 and then PEG tube placement on 10/26 secondary to severe esophageal stricture. Pt now pulled out PEG tube and has been agitated with RN staff. Pt is now s/p fall out of bed on 11/3.    PT Comments    Pt is making good progress towards goals with improved ambulation/balance this date. Improved lateral lean despite pt still being very confused to situation. Follows commands well. Sitter in room and chair alarm set. Will progress as able.   Follow Up Recommendations  SNF     Equipment Recommendations  Rolling walker with 5" wheels    Recommendations for Other Services       Precautions / Restrictions Precautions Precautions: Fall Restrictions Weight Bearing Restrictions: No    Mobility  Bed Mobility Overal bed mobility: Needs Assistance Bed Mobility: Supine to Sit     Supine to sit: Min assist     General bed mobility comments: safe technique with upright posture. IMproved lateral leaning this date.  Transfers Overall transfer level: Needs assistance Equipment used: Rolling walker (2 wheeled) Transfers: Sit to/from Stand Sit to Stand: Min assist         General transfer comment: performed multiple sit<>stand transfers. Follows commands well.  Ambulation/Gait Ambulation/Gait assistance: Min assist Gait Distance (Feet): 5 Feet Assistive device: Rolling walker (2 wheeled) Gait Pattern/deviations: Step-to pattern     General Gait Details: ambulated with improved steady gait pattern. Needs cues for sequencing and small steps noted. Fatigues quickly rating at 5/10 on RPE scale   Stairs              Wheelchair Mobility    Modified Rankin (Stroke Patients Only)       Balance Overall balance assessment: Needs assistance;History of Falls Sitting-balance support: Feet supported Sitting balance-Leahy Scale: Good     Standing balance support: Bilateral upper extremity supported Standing balance-Leahy Scale: Fair                              Cognition Arousal/Alertness: Awake/alert Behavior During Therapy: WFL for tasks assessed/performed Overall Cognitive Status: Impaired/Different from baseline                                 General Comments: disoriented. THought he fell tripping over baby car seat. Has no recollection of being in hospital      Exercises Other Exercises Other Exercises: Supine/seated ther-ex including AP, quad sets, SLRs, hip abd/add, resisted heel slides, glut sets, and LAQ. All therex performed x 15 reps with cga/min assist at time    General Comments General comments (skin integrity, edema, etc.): sitter in room      Pertinent Vitals/Pain Pain Assessment: Faces Faces Pain Scale: Hurts even more Pain Location: face Pain Descriptors / Indicators: Dull Pain Intervention(s): Limited activity within patient's tolerance    Home Living                      Prior Function            PT Goals (current goals can now be found  in the care plan section) Acute Rehab PT Goals Patient Stated Goal: to get stronger PT Goal Formulation: With patient Time For Goal Achievement: 08/23/19 Potential to Achieve Goals: Good Progress towards PT goals: Progressing toward goals    Frequency    Min 2X/week      PT Plan Current plan remains appropriate    Co-evaluation              AM-PAC PT "6 Clicks" Mobility   Outcome Measure  Help needed turning from your back to your side while in a flat bed without using bedrails?: A Little Help needed moving from lying on your back to sitting on the side of a flat bed  without using bedrails?: A Little Help needed moving to and from a bed to a chair (including a wheelchair)?: A Little Help needed standing up from a chair using your arms (e.g., wheelchair or bedside chair)?: A Little Help needed to walk in hospital room?: A Lot Help needed climbing 3-5 steps with a railing? : Total 6 Click Score: 15    End of Session Equipment Utilized During Treatment: Gait belt Activity Tolerance: Patient tolerated treatment well Patient left: in chair;with chair alarm set;with nursing/sitter in room Nurse Communication: Mobility status PT Visit Diagnosis: Muscle weakness (generalized) (M62.81);Difficulty in walking, not elsewhere classified (R26.2);History of falling (Z91.81);Pain;Unsteadiness on feet (R26.81) Pain - Right/Left: Right Pain - part of body: (face)     Time: 8527-7824 PT Time Calculation (min) (ACUTE ONLY): 23 min  Charges:  $Gait Training: 8-22 mins $Therapeutic Exercise: 8-22 mins                     Greggory Stallion, PT, DPT [phone]    Sora Vrooman 08/15/2019, 4:13 PM

## 2019-08-15 NOTE — Progress Notes (Signed)
[place] visited w/ pt as a f/u post-op of receiving PEG tube. Pt had a moderate affect w/ sitter at bedside. Pt presented to be lucid enough to have a conversation but verbally stated that he was delirious b/c of being NPO for several days. Pt did not share how the feeding tube was removed but did request prayer as he also c/o pain in his wrist, head, and chest. CH provided spiritual and social support as well as scripture reading ending the visit w/ prayer.  Ch recommends f/u support at a later time.    08/15/19 1000  Clinical Encounter Type  Visited With Patient;Health care provider  Visit Type Social support;Spiritual support;Follow-up  Spiritual Encounters  Spiritual Needs Prayer;Emotional  Stress Factors  Patient Stress Factors Exhausted;Health changes;Loss of control;Major life changes  Family Stress Factors None identified

## 2019-08-15 NOTE — Progress Notes (Addendum)
Patient was found after a fall by nursing staff. Be alarm was on. Patient fell face first on the ground hitting his upper right forehead, his nose, his lip and his left knee. Lip was pierced by tooth which had broken off. IV was also pulled out by patient. Blood pressure, HR and respiratory rate elevated, but stable. Patient alert and answering questions appropriately. No loss of consciousness. Patient had large bowel movement on floor as well. Merlene Laughter, on call provider notified as well as management. Patient was cleaned up and assisted back to bed once stable. NP came to assess patient, ordered CT and gave the Okay to give 2mg  of Morphine to patient as he was having pretty extensive pain (10/10 to face and knee) and alert enough to get IV pain medications. IV access reestablished and was given the morphine. Patient went to CT, and returned to room. He is now back in room, resting but still in pain. Was given scheduled Toradol, which I got MD to order, as he is strict NPO. Vital signs being taken per falls protocol. Tried to call sister to notifiy, but was unable to get ahold of her. Vitals are Will continue to monitor.

## 2019-08-16 DIAGNOSIS — Z7189 Other specified counseling: Secondary | ICD-10-CM

## 2019-08-16 DIAGNOSIS — Z515 Encounter for palliative care: Secondary | ICD-10-CM

## 2019-08-16 DIAGNOSIS — T17908A Unspecified foreign body in respiratory tract, part unspecified causing other injury, initial encounter: Secondary | ICD-10-CM

## 2019-08-16 LAB — COMPREHENSIVE METABOLIC PANEL
ALT: 17 U/L (ref 0–44)
AST: 21 U/L (ref 15–41)
Albumin: 2.8 g/dL — ABNORMAL LOW (ref 3.5–5.0)
Alkaline Phosphatase: 92 U/L (ref 38–126)
Anion gap: 12 (ref 5–15)
BUN: <5 mg/dL — ABNORMAL LOW (ref 8–23)
CO2: 16 mmol/L — ABNORMAL LOW (ref 22–32)
Calcium: 8.7 mg/dL — ABNORMAL LOW (ref 8.9–10.3)
Chloride: 112 mmol/L — ABNORMAL HIGH (ref 98–111)
Creatinine, Ser: 0.53 mg/dL — ABNORMAL LOW (ref 0.61–1.24)
GFR calc Af Amer: >60 mL/min (ref >60)
GFR calc non Af Amer: >60 mL/min (ref >60)
Glucose, Bld: 72 mg/dL (ref 70–99)
Potassium: 4 mmol/L (ref 3.5–5.1)
Sodium: 140 mmol/L (ref 135–145)
Total Bilirubin: 1.3 mg/dL — ABNORMAL HIGH (ref 0.3–1.2)
Total Protein: 5.9 g/dL — ABNORMAL LOW (ref 6.5–8.1)

## 2019-08-16 LAB — BASIC METABOLIC PANEL
Anion gap: 14 (ref 5–15)
BUN: <5 mg/dL — ABNORMAL LOW (ref 8–23)
CO2: 15 mmol/L — ABNORMAL LOW (ref 22–32)
Calcium: 8.6 mg/dL — ABNORMAL LOW (ref 8.9–10.3)
Chloride: 111 mmol/L (ref 98–111)
Creatinine, Ser: 0.6 mg/dL — ABNORMAL LOW (ref 0.61–1.24)
GFR calc Af Amer: >60 mL/min (ref >60)
GFR calc non Af Amer: >60 mL/min (ref >60)
Glucose, Bld: 74 mg/dL (ref 70–99)
Potassium: 3.8 mmol/L (ref 3.5–5.1)
Sodium: 140 mmol/L (ref 135–145)

## 2019-08-16 LAB — CBC
HCT: 36.7 % — ABNORMAL LOW (ref 39.0–52.0)
Hemoglobin: 10.7 g/dL — ABNORMAL LOW (ref 13.0–17.0)
MCH: 25.2 pg — ABNORMAL LOW (ref 26.0–34.0)
MCHC: 29.2 g/dL — ABNORMAL LOW (ref 30.0–36.0)
MCV: 86.6 fL (ref 80.0–100.0)
Platelets: 382 10*3/uL (ref 150–400)
RBC: 4.24 MIL/uL (ref 4.22–5.81)
RDW: 25.2 % — ABNORMAL HIGH (ref 11.5–15.5)
WBC: 8.5 10*3/uL (ref 4.0–10.5)
nRBC: 0 % (ref 0.0–0.2)

## 2019-08-16 LAB — PHOSPHORUS: Phosphorus: 3.7 mg/dL (ref 2.5–4.6)

## 2019-08-16 LAB — MAGNESIUM: Magnesium: 2 mg/dL (ref 1.7–2.4)

## 2019-08-16 MED ORDER — SODIUM CHLORIDE 0.9 % IV BOLUS
2000.0000 mL | Freq: Once | INTRAVENOUS | Status: AC
  Administered 2019-08-16: 2000 mL via INTRAVENOUS

## 2019-08-16 MED ORDER — FLEET ENEMA 7-19 GM/118ML RE ENEM
1.0000 | ENEMA | Freq: Once | RECTAL | Status: AC
  Administered 2019-08-16: 1 via RECTAL

## 2019-08-16 MED ORDER — LACTULOSE ENEMA
300.0000 mL | Freq: Once | ORAL | Status: DC
  Filled 2019-08-16: qty 300

## 2019-08-16 NOTE — Progress Notes (Signed)
Radiology RN called and say that if the plan is to place his PEG tube tomorrow, patient needs to be NPO, to check CBC, PT/INR and to hold his lovenox tonight and tomorrow. Notify Dr. Blaine Hamper about this, he place the order in.   Also notify Dr. Blaine Hamper if we can change the lactulose enema order to fleet enema, because it needs to be diluted to 700 cc of NS and for patient to hold the enema in 30-60 minutes which I don't think the patient will tolerate, order change to fleet enema. RN will continue to monitor.

## 2019-08-16 NOTE — Progress Notes (Signed)
Pharmacy Electrolyte Monitoring Consult:  63 y.o. male admitted on 07/28/2019 with Cough and Chest Pain related to an acute PE.  Because of his alcohol use disorder he remains at a high risk for refeeding syndrome.   Patient has aspirated several times and is unable to take PO meds. He had a PEG tube placed 10/26 and tube feeds were initiated 10/27, but are now being held d/t nausea and vomiting.  Patient pulled out PEG tube on 10/30 - NPO currently - will verify   PEG tube replacement and adjust electrolyte replenishment if needed.   Labs:  Sodium (mmol/L)  Date Value  08/16/2019 140   Potassium (mmol/L)  Date Value  08/16/2019 3.8   Magnesium (mg/dL)  Date Value  08/16/2019 2.0   Phosphorus (mg/dL)  Date Value  08/16/2019 3.7   Calcium (mg/dL)  Date Value  08/16/2019 8.6 (L)   Albumin (g/dL)  Date Value  08/16/2019 2.8 (L)   Plan:  IVF changed to D5NS @ 59ml/hr    No magnesium replenishment warranted x 2 days   Recheck BMP tomorrow with AM labs  Pharmacy will continue to follow due to pt lack of nutrition.  Lu Duffel, PharmD, BCPS Clinical Pharmacist 08/16/2019 7:21 AM

## 2019-08-16 NOTE — Progress Notes (Addendum)
PROGRESS NOTE    George Wade.  EHO:[id number] DOB: 1956/10/10 DOA: 07/28/2019 PCP: Patient, [name] Per    Brief Narrative:   George Wade  is a 63 y.o. male with a known history of new medical problems as below. The patient presents the ED with above chief complaints.  The patient presents the ED with above chief complaints.  He has had chest pain in the central area, which is extirpated by cough and swallowing food.  He also complains of shortness breath and cough.  He has been drinking alcohol on daily basis.  The last drink was yesterday.  He denies any tremors or shaking.  He is found elevated d-dimer and CT angiogram of the chest report right-sided PE.  [name] requested admission and will start heparin drip.  Interim History: 1. Consulted IR 11/4, [name]. He states that pt just had PEG tube placement on 10/26. Pt pulled out the tube. It is not appropriate and is risky to put PEG tube again. Pt is likely to pull out the tube again. He recommended to consider other way of feeing pt. Pt is not a good candidate for TPN. He will likely also pull out PICC line.   2. Today 11/5 his mental status improved when I saw patient in the morning.  He is orientated x3.  He answered all questions appropriately.  He even promised me that he will never pull out PEG tube if it is placed again.   Assessment & Plan:   Principal Problem:   Alcohol-induced mood disorder (HCC) Active Problems:   Wernicke's encephalopathy   Alcohol dependence with uncomplicated intoxication ([place])   Pulmonary emboli (HCC)   Nausea & vomiting   Malnutrition of moderate degree   Problems with swallowing and mastication   Stricture and stenosis of esophagus   Fall   Elevated LFTs   Esophageal stricture   Acute hypoxic and hypercapnic respiratory failure due to aspiration pneumonia-As previously noted,weaned off oxygen with documented oxygen saturation of 97% on room air this morning.   -Patient was treated with IV Unasyn. -Continue duonebsnebulization therapy for now -PT SNF placement  Dysphagia- having severe aspiration episodes: GI was consulted. EGD 10/22 with benign-appearing for esophageal stenosis that was dilated. Patient status post PEG tube placement by interventional radiologist on 08/06/2019. Patient has pulled out PEG tube on 08/10/2019 in the evening. IR was consulted for PEG placement. [name] said that pt just had PEG tube placement on 10/26. Pt pulled out the tube. It is not appropriate and is risky to put PEG tube again. Pt is likely to pull out the tube again. He recommended to consider other way of feeing pt. TPN? Very difficult situation. I am hoping his mental status improves and then try to put PEG tube again. Today 11/5 his mental status improved when I saw patient in the morning.  He is orientated x3.  He answered all questions appropriately.  He even promised me that he will never pull out PEG tube if it is placed again. Pt is dry on physical examination. -Consulted to palliative -IVF: 2 L normal saline bolus, followed by  NS-d5 at 75 cc/h -Strict NPO due to esophageal stenosis - consulted IR, [name] kindly agreed to evaluate again tomorrow which is highly appreciated. -will hold levenox -check IRN/PTT -NPO after MN  Small right lung pulmonary emboli- seen on CT scan. Patient unable to receive Eliquis since PEG tube has been pulled out -Currently anticoagulation with  subcu Lovenox full dose -Readdress p.o. anticoagulation after placement of PEG -will hold lovenox now  Chronic iron deficiency anemia- hemoglobin is lower than baseline. Hgb 9.3, 8.8,10.6  -s/p IV iron x1 on 10/18 -Continue to monitor -Stable currently   DTs with history of EtOH abuse, and alcohol-induced mood disorder and wernicke's encephalopathy:  Patient weaned off Precedex drip and transferred out of ICU. Psychiatry consultedfor evaluation of  psychosis-appreciate their input-request that they reevaluate secondary to behavioral nonadherence versus delirium. Psychiatry wrote for Haldol as needed on initial consultation -Continue thiamine, folate and multivitamin.  -CIWA protocol  Elevated LFTs- likely due to alcohol use. AST/ALT improved. Right upper quadrant ultrasound showed hepatic steatosis -Monitor  Tobacco use -Nicotine patch ordered  Fall with lip laceration: Oral cavity lip laceration now 12 hours out from injury with small tooth fragment in depths of wound.  Dr. Gilda Crease from ENT was consulted. "I offered the patient the option of numbing the area and probing the depths of the wound to see if I might locate this small dental fragment, and possibly placing a couple of loose stiches to better align this flap of tissue but he refused". -appreciate [name] consultation. -started Augmentin 11/4   Left hand cellulitis : Felt to be probably due to bug bite. Patient seen by orthopedic physician . No evidence of deeper infection that would require I&D at this time. Was treated with IV Unasyn recently,clinical monitoring. Appreciate their follow-up.   DVT prophylaxis: on Lovenos -->hold lovenox for PEG tube placement Code Status: fall Family Communication: I called his sister by phone. Disposition Plan: not ready for discharge. Still need to solve his feeding issue due to severe dysphagia. Barriers for discharge:    Consultants:   ENT  psychiatry  Procedures:  EGD  Antimicrobials: Anti-infectives (From admission, onward)   Start     Dose/Rate Route Frequency Ordered Stop   08/15/19 1215  amoxicillin-clavulanate (AUGMENTIN) 875-125 MG per tablet 1 tablet     1 tablet Oral Every 12 hours 08/15/19 1212 08/25/19 0959   08/06/19 1415  ceFAZolin (ANCEF) IVPB 2g/100 mL premix     2 g 200 mL/hr over 30 Minutes Intravenous  Once 08/06/19 1403 08/06/19 1530   08/06/19 1406  ceFAZolin (ANCEF) 2-4 GM/100ML-% IVPB     Note to Pharmacy: Fransico Michael   : cabinet override      08/06/19 1406 08/07/19 0214   08/03/19 2000  fluconazole (DIFLUCAN) IVPB 200 mg  Status:  Discontinued     200 mg 100 mL/hr over 60 Minutes Intravenous Every 24 hours 08/03/19 1954 08/07/19 1115   07/31/19 0630  Ampicillin-Sulbactam (UNASYN) 3 g in sodium chloride 0.9 % 100 mL IVPB  Status:  Discontinued     3 g 200 mL/hr over 30 Minutes Intravenous Every 6 hours 07/31/19 0620 08/09/19 1011         Subjective:  pt feels hungry. Has somefright facial pain. Pt is alert, orientated x3.  Objective: Vitals:   08/15/19 1734 08/15/19 2116 08/16/19 0105 08/16/19 0506  BP: (!) 117/91 (!) 121/93 (!) 127/105 107/77  Pulse: 96 100 (!) 103 (!) 102  Resp: 18 18 18 20   Temp: 98 F (36.7 C)   98.6 F (37 C)  TempSrc: Oral   Oral  SpO2: 96% 98% 96% 96%  Weight:      Height:        Intake/Output Summary (Last 24 hours) at 08/16/2019 0733 Last data filed at 08/16/2019 0247 Gross per 24 hour  Intake 715.5 ml  Output 850 ml  Net -134.5 ml   Filed Weights   08/06/19 1355 08/12/19 0500 08/13/19 0631  Weight: 73.8 kg 71.5 kg 71.7 kg    Examination: Physical Exam:  General: Not in acute distress.  Dry mucosal membrane HEENT: PERRL, EOMI, no scleral icterus, No JVD or bruit. Has laceration of the interior of the upper lip with a flap of tissue. Cardiac: S1/S2, RRR, No murmurs, gallops or rubs Pulm: Clear to auscultation bilaterally. No rales, wheezing, rhonchi or rubs. Abd: Soft, nondistended, nontender, no rebound pain, no organomegaly, BS present Ext: No edema. 2+DP/PT pulse bilaterally Musculoskeletal: No joint deformities, erythema, or stiffness, ROM full Skin: No rashes.  Neuro: currently oriented X3, cranial nerves II-XII grossly intact. Psych: Patient is not psychotic, no suicidal or hemocidal ideation.    Data Reviewed: I have personally reviewed following labs and imaging studies  CBC: Recent Labs  Lab  08/10/19 0548 08/11/19 0535 08/14/19 0400 08/16/19 0448  WBC 7.4 8.4 9.2 8.5  HGB 9.2* 9.3* 10.6* 10.7*  HCT 31.4* 31.0* 35.9* 36.7*  MCV 83.1 80.7 83.5 86.6  PLT 305 349 405* 469   Basic Metabolic Panel: Recent Labs  Lab 08/11/19 0535 08/13/19 0448 08/14/19 0400 08/15/19 0600 08/16/19 0006 08/16/19 0448  NA 139 139 138 140 140 140  K 3.6 3.4* 3.7 3.9 4.0 3.8  CL 106 110 107 110 112* 111  CO2 23 20* 18* 16* 16* 15*  GLUCOSE 85 73 75 71 72 74  BUN <5* <5* <5* <5* <5* <5*  CREATININE 0.59* 0.45* 0.56* 0.67 0.53* 0.60*  CALCIUM 8.4* 8.3* 8.9 8.8* 8.7* 8.6*  MG 2.0 1.9 1.8 2.2  --  2.0  PHOS 3.4 3.2  --   --   --  3.7   GFR: Estimated Creatinine Clearance: 91.4 mL/min (A) (by C-G formula based on SCr of 0.6 mg/dL (L)). Liver Function Tests: Recent Labs  Lab 08/16/19 0006  AST 21  ALT 17  ALKPHOS 92  BILITOT 1.3*  PROT 5.9*  ALBUMIN 2.8*   No results for input(s): LIPASE, AMYLASE in the last 168 hours. No results for input(s): AMMONIA in the last 168 hours. Coagulation Profile: No results for input(s): INR, PROTIME in the last 168 hours. Cardiac Enzymes: No results for input(s): CKTOTAL, CKMB, CKMBINDEX, TROPONINI in the last 168 hours. BNP (last 3 results) No results for input(s): PROBNP in the last 8760 hours. HbA1C: No results for input(s): HGBA1C in the last 72 hours. CBG: No results for input(s): GLUCAP in the last 168 hours. Lipid Profile: No results for input(s): CHOL, HDL, LDLCALC, TRIG, CHOLHDL, LDLDIRECT in the last 72 hours. Thyroid Function Tests: No results for input(s): TSH, T4TOTAL, FREET4, T3FREE, THYROIDAB in the last 72 hours. Anemia Panel: No results for input(s): VITAMINB12, FOLATE, FERRITIN, TIBC, IRON, RETICCTPCT in the last 72 hours. Sepsis Labs: No results for input(s): PROCALCITON, LATICACIDVEN in the last 168 hours.  No results found for this or any previous visit (from the past 240 hour(s)).    Radiology Studies: Ct Head Wo  Contrast  Result Date: 08/14/2019 CLINICAL DATA:  Initial evaluation for acute trauma, fall. EXAM: CT HEAD WITHOUT CONTRAST TECHNIQUE: Contiguous axial images were obtained from the base of the skull through the vertex without intravenous contrast. COMPARISON:  Prior head CT from 07/17/2019. FINDINGS: Brain: Generalized age-related cerebral atrophy with mild chronic small vessel ischemic disease. No acute intracranial hemorrhage. No acute large vessel territory infarct. No mass lesion, midline shift  or mass effect no hydrocephalus. No extra-axial fluid collection. Vascular: No hyperdense vessel. Scattered vascular calcifications noted within the carotid siphons. Skull: Scalp soft tissues demonstrate no acute finding. Calvarium intact. Sinuses/Orbits: Globes and orbital soft tissues within normal limits. Mild scattered mucosal thickening noted within the ethmoidal air cells and maxillary sinuses. No mastoid effusion. Other: None. IMPRESSION: 1. No acute intracranial abnormality. 2. Age-related cerebral atrophy with mild chronic small vessel ischemic disease, stable. Electronically Signed   By: Jeannine Boga M.D.   On: 08/14/2019 22:41   Ct Maxillofacial Wo Contrast  Result Date: 08/14/2019 CLINICAL DATA:  Darrick Grinder evaluation for acute trauma, fall. EXAM: CT MAXILLOFACIAL WITHOUT CONTRAST TECHNIQUE: Multidetector CT imaging of the maxillofacial structures was performed. Multiplanar CT image reconstructions were also generated. COMPARISON:  None. FINDINGS: Osseous: Zygomatic arches intact. No acute maxillary fracture. Pterygoid plates intact. Nasal bones intact without fracture. Nasal septum intact. No acute mandibular fracture. Mandibular condyles normally situated. Dentition grossly intact. Orbits: Globes and orbital soft tissues within normal limits. Bony orbits intact. Sinuses: Mild mucoperiosteal thickening present within the ethmoidal air cells and right greater than left maxillary sinuses. Paranasal  sinuses are otherwise clear. No hemosinus. Mastoid air cells and middle ear cavities are well pneumatized and free of fluid. Soft tissues: Soft tissue laceration present just to the left of midline at the upper lip. Superimposed radiopaque density could reflect debris/foreign body (series 3, image 59). Again, adjacent dentition grossly intact. No other visible soft tissue injury about the face. Limited intracranial: Unremarkable. IMPRESSION: 1. Soft tissue laceration just to the left of midline at the upper lip. Superimposed radiopaque density could reflect debris/foreign body. Adjacent dentition grossly intact. 2. No other acute maxillofacial injury identified.  No fracture. Electronically Signed   By: Jeannine Boga M.D.   On: 08/14/2019 22:55     Scheduled Meds:  amoxicillin-clavulanate  1 tablet Oral Q12H   enoxaparin (LOVENOX) injection  1 mg/kg Subcutaneous Q12H   feeding supplement (PRO-STAT SUGAR FREE 64)  30 mL Per Tube Daily   folic acid  1 mg Per Tube Daily   free water  30 mL Per Tube Q4H   pantoprazole (PROTONIX) IV  40 mg Intravenous Q12H   sodium chloride flush  3 mL Intravenous Q12H   thiamine  100 mg Per Tube Daily   Continuous Infusions:  sodium chloride Stopped (07/29/19 1654)   dextrose 5 % and 0.9% NaCl 75 mL/hr at 08/16/19 0102   feeding supplement (OSMOLITE 1.5 CAL) Stopped (08/13/19 1233)     LOS: 19 days    Time spent: [place], DO [facility] PAGER is on [place]  If 7PM-7AM, please contact night-coverage [email or web address] Password [facility] 08/16/2019, 7:33 AM

## 2019-08-16 NOTE — Consult Note (Signed)
Consultation Note Date: 08/16/2019   Patient Name: George Wade.  DOB: December 06, 1955  MRN: [id number]  Age / Sex: 63 y.o., male  PCP: Patient, [name] Per Referring Physician: Ivor Costa, MD  Reason for Consultation: Establishing goals of care  HPI/Patient Profile:  63 y/o male. Per EMR, patient arrived to the hospital with chest pain and cough.  He has been drinking alcohol on daily basis.  The last drink was yesterday.  He denies any tremors or shaking.  He is found elevated d-dimer and CT angiogram of the chest report right-sided PE.   Clinical Assessment and Goals of Care: Patient is sitting in bedside chair. He states he has children and would want George Wade, his son [phone] to be his surrogate decision maker if one were needed.   [name] voices concerns for his watch which was his grand-father's that has not been located. He also voices concern that he wants another PEG tube. He states "people think I pulled out my feeding tube but I did not." He states he and his girlfriend "were playing around" and it accidentally pulled out. He states that since his previous discharge his swallowing has gotten worse.    We discussed his diagnosis, prognosis, and GOC.  A detailed discussion was had today regarding advanced directives.  Concepts specific to code status, artifical feeding and hydration, IV antibiotics and rehospitalization were discussed.  The difference between an aggressive medical intervention path and a comfort care path was discussed.  Values and goals of care important to patient and family were attempted to be elicited.  Discussed limitations of medical interventions to prolong quality of life in some situations and discussed the concept of human mortality.  He states he wants any intervention that is available to him at this time including feeding tube or operative management. He  states that he initially considered a DNR status, but he is not ready to make that change.        SUMMARY OF RECOMMENDATIONS   Full code/fullscope.  States he would want his son George Wade [phone] to be his surrogate decision maker if one is needed.       Primary Diagnoses: Present on Admission: . Alcohol dependence with uncomplicated intoxication ([place]) . Pulmonary emboli ([place]) . Alcohol-induced mood disorder ([place]) . Nausea & vomiting . Malnutrition of moderate degree . Wernicke's encephalopathy . Fall   I have reviewed the medical record, interviewed the patient and family, and examined the patient. The following aspects are pertinent.  Past Medical History:  Diagnosis Date  . Alcoholism with psychosis with complication, with hallucinations ([place])   . ED (erectile dysfunction)   . Fatigue   . History of pneumonia   . Hypercholesteremia   . Hypertension   . Lymphoma ([place]) 2008   non hodgkins lymphoma  . Rectal bleeding   . 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 Marital status: Divorced  Spouse name: Not on file  . Number of children: Not on file  . Years of education: Not on file  . Highest education level: Not on file  Occupational History  . Not on file  Social Needs  . Financial resource strain: Not on file  . Food insecurity    Worry: Not on file    Inability: Not on file  . Transportation needs    Medical: Not on file    Non-medical: Not on file  Tobacco Use  . Smoking status: Never Smoker  . Smokeless tobacco: Never Used  Substance and Sexual Activity  . Alcohol use: Yes    Alcohol/week: 5.0 standard drinks    Types: 5 Glasses of wine per week    Frequency: Never  . Drug use: No  . Sexual activity: Not on file  Lifestyle  . Physical activity    Days per week: Not on file    Minutes per session: Not on file  . Stress: Not on file  Relationships  . Social Product manager on phone: Not on file    Gets together: Not on file    Attends religious service: Not on file    Active member of club or organization: Not on file    Attends meetings of clubs or organizations: Not on file    Relationship status: Not on file  Other Topics Concern  . Not on file  Social History Narrative  . Not on file   Family History  Problem Relation Age of Onset  . Cancer Mother   . Hypertension Father   . Cancer Paternal Grandmother    Scheduled Meds: . amoxicillin-clavulanate  1 tablet Oral Q12H  . enoxaparin (LOVENOX) injection  1 mg/kg Subcutaneous Q12H  . feeding supplement (PRO-STAT SUGAR FREE 64)  30 mL Per Tube Daily  . folic acid  1 mg Per Tube Daily  . free water  30 mL Per Tube Q4H  . lactulose  300 mL Rectal Once  . pantoprazole (PROTONIX) IV  40 mg Intravenous Q12H  . sodium chloride flush  3 mL Intravenous Q12H  . thiamine  100 mg Per Tube Daily   Continuous Infusions: . sodium chloride Stopped (07/29/19 1654)  . dextrose 5 % and 0.9% NaCl 75 mL/hr at 08/16/19 0102  . feeding supplement (OSMOLITE 1.5 CAL) Stopped (08/13/19 1233)   PRN Meds:.sodium chloride, acetaminophen **OR** acetaminophen, albuterol, bisacodyl, guaiFENesin-dextromethorphan, haloperidol lactate, HYDROcodone-acetaminophen, metoprolol tartrate, morphine injection, ondansetron **OR** ondansetron (ZOFRAN) IV, promethazine, senna-docusate, sodium chloride flush Medications Prior to Admission:  Prior to Admission medications   Medication Sig Start Date End Date Taking? Authorizing Provider  Rivaroxaban (XARELTO) 15 MG TABS tablet Take 15 mg by mouth 2 (two) times daily with a meal.   Yes [provider]  rivaroxaban (XARELTO) 20 MG TABS tablet Take 20 mg by mouth daily. 07/23/19 07/22/20 Yes [provider]   Allergies  Allergen Reactions  . Sulfa Antibiotics Nausea Only   Review of Systems  All other systems reviewed and are negative.   Physical Exam Pulmonary:      Effort: Pulmonary effort is normal.  Neurological:     Mental Status: He is alert.     Vital Signs: BP 110/90 (BP Location: Left Arm)   Pulse (!) 103   Temp 98.7 F (37.1 C)   Resp 18   Ht 5\' 8"  (1.727 m)   Wt 71.7 kg   SpO2 94%   BMI 24.04 kg/m  Pain Scale: 0-10 POSS *See Group Information*: S-Acceptable,Sleep, easy to arouse Pain Score: 8    SpO2: SpO2: 94 % O2 Device:SpO2: 94 % O2 Flow Rate: .O2 Flow Rate (L/min): 1.5 L/min  IO: Intake/output summary:   Intake/Output Summary (Last 24 hours) at 08/16/2019 1604 Last data filed at 08/16/2019 1438 Gross per 24 hour  Intake 6 ml  Output 1300 ml  Net -1294 ml    LBM: Last BM Date: 08/14/19 Baseline Weight: Weight: 77.1 kg Most recent weight: Weight: 71.7 kg     Palliative Assessment/Data:     Time In:3:28 Time Out: 4:18 Time Total: 50 min Greater than 50%  of this time was spent counseling and coordinating care related to the above assessment and plan.  Signed by: Asencion Gowda, NP   Please contact Palliative Medicine Team phone at [phone] for questions and concerns.  For individual provider: See Shea Evans

## 2019-08-16 NOTE — Progress Notes (Signed)
Notify Dr. Blaine Hamper about patient's sitter order if its ok to renew because he is still high risk for falling. Ok to renew, and will also have safety rounder. RN will continue to monitor.

## 2019-08-17 ENCOUNTER — Other Ambulatory Visit: Payer: Self-pay | Admitting: *Deleted

## 2019-08-17 ENCOUNTER — Encounter: Payer: Self-pay | Admitting: Interventional Radiology

## 2019-08-17 ENCOUNTER — Inpatient Hospital Stay: Payer: Self-pay

## 2019-08-17 HISTORY — PX: IR GASTROSTOMY TUBE MOD SED: IMG625

## 2019-08-17 LAB — CBC
HCT: 32.9 % — ABNORMAL LOW (ref 39.0–52.0)
Hemoglobin: 9.7 g/dL — ABNORMAL LOW (ref 13.0–17.0)
MCH: 24.7 pg — ABNORMAL LOW (ref 26.0–34.0)
MCHC: 29.5 g/dL — ABNORMAL LOW (ref 30.0–36.0)
MCV: 83.7 fL (ref 80.0–100.0)
Platelets: 352 10*3/uL (ref 150–400)
RBC: 3.93 MIL/uL — ABNORMAL LOW (ref 4.22–5.81)
RDW: 24.3 % — ABNORMAL HIGH (ref 11.5–15.5)
WBC: 4.4 10*3/uL (ref 4.0–10.5)
nRBC: 0 % (ref 0.0–0.2)

## 2019-08-17 LAB — BASIC METABOLIC PANEL
Anion gap: 8 (ref 5–15)
BUN: <5 mg/dL — ABNORMAL LOW (ref 8–23)
CO2: 21 mmol/L — ABNORMAL LOW (ref 22–32)
Calcium: 8.2 mg/dL — ABNORMAL LOW (ref 8.9–10.3)
Chloride: 107 mmol/L (ref 98–111)
Creatinine, Ser: 0.46 mg/dL — ABNORMAL LOW (ref 0.61–1.24)
GFR calc Af Amer: >60 mL/min (ref >60)
GFR calc non Af Amer: >60 mL/min (ref >60)
Glucose, Bld: 89 mg/dL (ref 70–99)
Potassium: 3 mmol/L — ABNORMAL LOW (ref 3.5–5.1)
Sodium: 136 mmol/L (ref 135–145)

## 2019-08-17 LAB — APTT: aPTT: 34 seconds (ref 24–36)

## 2019-08-17 LAB — PROTIME-INR
INR: 1.2 (ref 0.8–1.2)
Prothrombin Time: 15 seconds (ref 11.4–15.2)

## 2019-08-17 LAB — PHOSPHORUS: Phosphorus: 3.2 mg/dL (ref 2.5–4.6)

## 2019-08-17 LAB — MAGNESIUM: Magnesium: 1.6 mg/dL — ABNORMAL LOW (ref 1.7–2.4)

## 2019-08-17 MED ORDER — MORPHINE SULFATE (PF) 2 MG/ML IV SOLN
1.0000 mg | INTRAVENOUS | Status: DC | PRN
  Administered 2019-08-17 – 2019-08-20 (×5): 1 mg via INTRAVENOUS
  Filled 2019-08-17 (×5): qty 1

## 2019-08-17 MED ORDER — AMOXICILLIN-POT CLAVULANATE 875-125 MG PO TABS: 1.0000 | ORAL_TABLET | Freq: Two times a day (BID) | ORAL | Status: DC

## 2019-08-17 MED ORDER — HYDROCODONE-ACETAMINOPHEN 5-325 MG PO TABS
1.0000 | ORAL_TABLET | ORAL | Status: DC | PRN
  Administered 2019-08-19: 1 via ORAL
  Administered 2019-08-20 – 2019-08-22 (×5): 2 via ORAL
  Administered 2019-08-22: 1 via ORAL
  Administered 2019-08-23 – 2019-08-26 (×5): 2 via ORAL
  Administered 2019-08-26: 10:00:00 1 via ORAL
  Administered 2019-08-26 – 2019-08-29 (×11): 2 via ORAL
  Administered 2019-08-29: 1 via ORAL
  Filled 2019-08-17 (×18): qty 2
  Filled 2019-08-17: qty 1
  Filled 2019-08-17 (×3): qty 2
  Filled 2019-08-17: qty 1
  Filled 2019-08-17: qty 2

## 2019-08-17 MED ORDER — MIDAZOLAM HCL 2 MG/2ML IJ SOLN
INTRAMUSCULAR | Status: AC | PRN
  Administered 2019-08-17 (×2): 1 mg via INTRAVENOUS

## 2019-08-17 MED ORDER — MIDAZOLAM HCL 5 MG/5ML IJ SOLN
INTRAMUSCULAR | Status: AC
  Filled 2019-08-17: qty 5

## 2019-08-17 MED ORDER — FENTANYL CITRATE (PF) 100 MCG/2ML IJ SOLN
INTRAMUSCULAR | Status: AC | PRN
  Administered 2019-08-17 (×2): 50 ug via INTRAVENOUS

## 2019-08-17 MED ORDER — APIXABAN 5 MG PO TABS: 5.0000 mg | ORAL_TABLET | Freq: Two times a day (BID) | ORAL | Status: DC

## 2019-08-17 MED ORDER — APIXABAN 5 MG PO TABS
5.0000 mg | ORAL_TABLET | Freq: Two times a day (BID) | ORAL | Status: DC
  Administered 2019-08-18 – 2019-08-29 (×22): 5 mg
  Filled 2019-08-17 (×22): qty 1

## 2019-08-17 MED ORDER — ENOXAPARIN SODIUM 80 MG/0.8ML ~~LOC~~ SOLN
1.0000 mg/kg | Freq: Two times a day (BID) | SUBCUTANEOUS | Status: AC
  Administered 2019-08-17 – 2019-08-18 (×2): 70 mg via SUBCUTANEOUS
  Filled 2019-08-17 (×2): qty 0.8

## 2019-08-17 MED ORDER — ONDANSETRON HCL 4 MG/2ML IJ SOLN
4.0000 mg | INTRAMUSCULAR | Status: DC | PRN
  Filled 2019-08-17 (×2): qty 2

## 2019-08-17 MED ORDER — HYDROMORPHONE HCL 1 MG/ML IJ SOLN
1.0000 mg | INTRAMUSCULAR | Status: DC | PRN
  Administered 2019-08-21 – 2019-08-28 (×29): 1 mg via INTRAVENOUS
  Filled 2019-08-17 (×30): qty 1

## 2019-08-17 MED ORDER — FENTANYL CITRATE (PF) 100 MCG/2ML IJ SOLN
INTRAMUSCULAR | Status: AC
  Filled 2019-08-17: qty 2

## 2019-08-17 MED ORDER — AMOXICILLIN-POT CLAVULANATE 400-57 MG/5ML PO SUSR
800.0000 mg | Freq: Two times a day (BID) | ORAL | Status: AC
  Administered 2019-08-17 (×2): 800 mg via ORAL
  Filled 2019-08-17 (×4): qty 10

## 2019-08-17 MED ORDER — KCL IN DEXTROSE-NACL 40-5-0.9 MEQ/L-%-% IV SOLN
INTRAVENOUS | Status: DC
  Administered 2019-08-17 – 2019-08-18 (×2): via INTRAVENOUS
  Filled 2019-08-17 (×5): qty 1000

## 2019-08-17 MED ORDER — GLUCAGON HCL RDNA (DIAGNOSTIC) 1 MG IJ SOLR
INTRAMUSCULAR | Status: AC
  Filled 2019-08-17: qty 1

## 2019-08-17 MED ORDER — AMOXICILLIN-POT CLAVULANATE 875-125 MG PO TABS
1.0000 | ORAL_TABLET | Freq: Two times a day (BID) | ORAL | Status: AC
  Administered 2019-08-18 – 2019-08-26 (×17): 1 via ORAL
  Filled 2019-08-17 (×17): qty 1

## 2019-08-17 MED ORDER — GLUCAGON HCL RDNA (DIAGNOSTIC) 1 MG IJ SOLR
INTRAMUSCULAR | Status: AC | PRN
  Administered 2019-08-17: .5 mg via INTRAVENOUS

## 2019-08-17 MED ORDER — IODIXANOL 320 MG/ML IV SOLN: 50.0000 mL | Freq: Once | INTRAVENOUS | Status: DC | PRN

## 2019-08-17 MED ORDER — MAGNESIUM SULFATE 2 GM/50ML IV SOLN
2.0000 g | Freq: Once | INTRAVENOUS | Status: AC
  Administered 2019-08-17: 2 g via INTRAVENOUS
  Filled 2019-08-17: qty 50

## 2019-08-17 NOTE — Progress Notes (Signed)
Patient is asking if he can have his belongings back that were lock up in security. He is very persistent about it and is saying he takes full responsibility if we give it back to him. Explained and educated the reasoning why we need to lock them up. Called security and went to bedside and explained the same thing that we need to lock it up to prevent for it to be loss, still wanting his belongings. Security will talk to his supervisor about this and update me.

## 2019-08-17 NOTE — Progress Notes (Signed)
Patient clinically stable post PEG tube placement per Dr Vernard Gambles today. Tolerated procedure well with vitals stable throughout procedure and after. Awake/alert/oriented post procedure. Received Versed 2mg  along with Fentanyl 116mcg IV for procedure. Denies complaints at this time.report called to [place] with questions answered.

## 2019-08-17 NOTE — Progress Notes (Signed)
PROGRESS NOTE    George Wade.  VOZ:[id number] DOB: 20-Jan-1956 DOA: 07/28/2019 PCP: Patient, [name] Per    Brief Narrative:   George Wade  is a 63 y.o. male with a known history of new medical problems as below. The patient presents the ED with above chief complaints.  The patient presents the ED with above chief complaints.  He has had chest pain in the central area, which is extirpated by cough and swallowing food.  He also complains of shortness breath and cough.  He has been drinking alcohol on daily basis.  The last drink was yesterday.  He denies any tremors or shaking.  He is found elevated d-dimer and CT angiogram of the chest report right-sided PE.  [name] requested admission and will start heparin drip.  Interim History: -PEG tube is placed by [name] from IR  Assessment & Plan:   Principal Problem:   Alcohol-induced mood disorder ([place]) Active Problems:   Wernicke's encephalopathy   Alcohol dependence with uncomplicated intoxication (George Wade)   Pulmonary emboli ([facility])   Nausea & vomiting   Malnutrition of moderate degree   Problems with swallowing and mastication   Stricture and stenosis of esophagus   Fall   Elevated LFTs   Esophageal stricture   Aspiration into airway   Acute hypoxic and hypercapnic respiratory failure due to aspiration pneumonia-As previously noted,weaned off oxygen with documented oxygen saturation of 97% on room air this morning.  -Patient was treated with IV Unasyn. -Continue duonebsnebulization therapy for now -PT consult for possible SNF placement  Dysphagia- having severe aspiration episodes: GI was consulted. EGD 10/22 with benign-appearing for esophageal stenosis that was dilated. Patient status post PEG tube placement by interventional radiologist on 08/06/2019. Patient has pulled out PEG tube on 08/10/2019 in the evening. IR was consulted for PEG placement. [name] said that pt just had PEG tube placement on  10/26. Pt pulled out the tube. It is not appropriate and is risky to put PEG tube again. Pt is likely to pull out the tube again. He recommended to consider other way of feeing pt. TPN? Very difficult situation. I am hoping his mental status improves and then try to put PEG tube again. On 08/16/19,  his mental status improved when I saw patient in the morning.  He is orientated x3.  He answered all questions appropriately.  He even promised me that he will never pull out PEG tube if it is placed again.   -Since pt's mental status improved. Re-consulted IR, [name] placed PEG tube 11/6 which is highly appreciated - NS-d5 at 60 cc/h -NPO tonight -->we may be able to start using PEG tube tomorrow. - consulted IR, [name] kindly agreed to evaluate again tomorrow which is highly appreciated. -will hold levenox -check IRN/PTT -NPO after MN  Small right lung pulmonary emboli- seen on CT scan. Patient unable to receive Eliquis since PEG tube has been pulled out -Currently anticoagulation with subcu Lovenox full dose -Readdress p.o. anticoagulation  -will give levenox tonight and may restart Eliquis tomorrow  Chronic iron deficiency anemia- hemoglobin is lower than baseline. Hgb 9.3, 8.8,10.6  -s/p IV iron x1 on 10/18 -Continue to monitor -Stable currently   DTs with history of EtOH abuse, and alcohol-induced mood disorder and wernicke's encephalopathy:  Patient weaned off Precedex drip and transferred out of ICU. Psychiatry consultedfor evaluation of psychosis-appreciate their input-request that they reevaluate secondary to behavioral nonadherence versus delirium. Psychiatry wrote for Haldol  as needed on initial consultation -Continue thiamine, folate and multivitamin.  -CIWA protocol  Elevated LFTs- likely due to alcohol use. AST/ALT improved. Right upper quadrant ultrasound showed hepatic steatosis -Monitor  Tobacco use -Nicotine patch ordered  Fall with lip laceration: Oral  cavity lip laceration now 12 hours out from injury with small tooth fragment in depths of wound.  [name] from ENT was consulted. "I offered the patient the option of numbing the area and probing the depths of the wound to see if I might locate this small dental fragment, and possibly placing a couple of loose stiches to better align this flap of tissue but he refused". -appreciate [name] consultation. -started Augmentin 11/4  Left hand cellulitis : Felt to be probably due to bug bite. Patient seen by orthopedic physician . No evidence of deeper infection that would require I&D at this time. Was treated with IV Unasyn recently,clinical monitoring. Appreciate their follow-up.   DVT prophylaxis: on Lovenos  Code Status: fall Family Communication: I called his sister by phone. Disposition Plan: not ready for discharge. Still need to solve his feeding issue due to severe dysphagia. Barriers for discharge:    Consultants:   ENT  Psychiatry  IR  Procedures:  EGD  PEG tube placement 11/6  Antimicrobials: Anti-infectives (From admission, onward)   Start     Dose/Rate Route Frequency Ordered Stop   08/15/19 1215  amoxicillin-clavulanate (AUGMENTIN) 875-125 MG per tablet 1 tablet     1 tablet Oral Every 12 hours 08/15/19 1212 08/25/19 0959   08/06/19 1415  ceFAZolin (ANCEF) IVPB 2g/100 mL premix     2 g 200 mL/hr over 30 Minutes Intravenous  Once 08/06/19 1403 08/06/19 1530   08/06/19 1406  ceFAZolin (ANCEF) 2-4 GM/100ML-% IVPB    Note to Pharmacy: Fransico Michael   : cabinet override      08/06/19 1406 08/07/19 0214   08/03/19 2000  fluconazole (DIFLUCAN) IVPB 200 mg  Status:  Discontinued     200 mg 100 mL/hr over 60 Minutes Intravenous Every 24 hours 08/03/19 1954 08/07/19 1115   07/31/19 0630  Ampicillin-Sulbactam (UNASYN) 3 g in sodium chloride 0.9 % 100 mL IVPB  Status:  Discontinued     3 g 200 mL/hr over 30 Minutes Intravenous Every 6 hours 07/31/19 0620 08/09/19  1011         Subjective:  pt feels hungry. Has somefright facial pain. Pt is alert, orientated x3.  Objective: Vitals:   08/16/19 1712 08/16/19 1929 08/17/19 0453 08/17/19 0731  BP: (!) 119/93 104/74 119/88 (!) 141/93  Pulse: (!) 110 99 93 100  Resp: 14 14 18 20   Temp:  99 F (37.2 C) (!) 97.5 F (36.4 C)   TempSrc:  Oral Oral   SpO2: 98% 98% 97% 97%  Weight:      Height:        Intake/Output Summary (Last 24 hours) at 08/17/2019 0748 Last data filed at 08/17/2019 0300 Gross per 24 hour  Intake 3045.48 ml  Output 450 ml  Net 2595.48 ml   Filed Weights   08/06/19 1355 08/12/19 0500 08/13/19 0631  Weight: 73.8 kg 71.5 kg 71.7 kg    Examination: Physical Exam:  General: Not in acute distress.  Dry mucosal membrane HEENT: PERRL, EOMI, no scleral icterus, No JVD or bruit. Has laceration of the interior of the upper lip with a flap of tissue. Cardiac: S1/S2, RRR, No murmurs, gallops or rubs Pulm: Clear to auscultation bilaterally. No rales,  wheezing, rhonchi or rubs. Abd: Soft, nondistended, nontender, no rebound pain, no organomegaly, BS present Ext: No edema. 2+DP/PT pulse bilaterally Musculoskeletal: No joint deformities, erythema, or stiffness, ROM full Skin: No rashes.  Neuro: currently oriented X3, cranial nerves II-XII grossly intact. Psych: Patient is not psychotic, no suicidal or hemocidal ideation.    Data Reviewed: I have personally reviewed following labs and imaging studies  CBC: Recent Labs  Lab 08/11/19 0535 08/14/19 0400 08/16/19 0448 08/17/19 0627  WBC 8.4 9.2 8.5 4.4  HGB 9.3* 10.6* 10.7* 9.7*  HCT 31.0* 35.9* 36.7* 32.9*  MCV 80.7 83.5 86.6 83.7  PLT 349 405* 382 324   Basic Metabolic Panel: Recent Labs  Lab 08/11/19 0535 08/13/19 0448 08/14/19 0400 08/15/19 0600 08/16/19 0006 08/16/19 0448 08/17/19 0627  NA 139 139 138 140 140 140 136  K 3.6 3.4* 3.7 3.9 4.0 3.8 3.0*  CL 106 110 107 110 112* 111 107  CO2 23 20* 18* 16* 16*  15* 21*  GLUCOSE 85 73 75 71 72 74 89  BUN <5* <5* <5* <5* <5* <5* <5*  CREATININE 0.59* 0.45* 0.56* 0.67 0.53* 0.60* 0.46*  CALCIUM 8.4* 8.3* 8.9 8.8* 8.7* 8.6* 8.2*  MG 2.0 1.9 1.8 2.2  --  2.0 1.6*  PHOS 3.4 3.2  --   --   --  3.7  --    GFR: Estimated Creatinine Clearance: 91.4 mL/min (A) (by C-G formula based on SCr of 0.46 mg/dL (L)). Liver Function Tests: Recent Labs  Lab 08/16/19 0006  AST 21  ALT 17  ALKPHOS 92  BILITOT 1.3*  PROT 5.9*  ALBUMIN 2.8*   No results for input(s): LIPASE, AMYLASE in the last 168 hours. No results for input(s): AMMONIA in the last 168 hours. Coagulation Profile: Recent Labs  Lab 08/17/19 0627  INR 1.2   Cardiac Enzymes: No results for input(s): CKTOTAL, CKMB, CKMBINDEX, TROPONINI in the last 168 hours. BNP (last 3 results) No results for input(s): PROBNP in the last 8760 hours. HbA1C: No results for input(s): HGBA1C in the last 72 hours. CBG: No results for input(s): GLUCAP in the last 168 hours. Lipid Profile: No results for input(s): CHOL, HDL, LDLCALC, TRIG, CHOLHDL, LDLDIRECT in the last 72 hours. Thyroid Function Tests: No results for input(s): TSH, T4TOTAL, FREET4, T3FREE, THYROIDAB in the last 72 hours. Anemia Panel: No results for input(s): VITAMINB12, FOLATE, FERRITIN, TIBC, IRON, RETICCTPCT in the last 72 hours. Sepsis Labs: No results for input(s): PROCALCITON, LATICACIDVEN in the last 168 hours.  No results found for this or any previous visit (from the past 240 hour(s)).    Radiology Studies: No results found.   Scheduled Meds: . amoxicillin-clavulanate  1 tablet Oral Q12H  . feeding supplement (PRO-STAT SUGAR FREE 64)  30 mL Per Tube Daily  . folic acid  1 mg Per Tube Daily  . pantoprazole (PROTONIX) IV  40 mg Intravenous Q12H  . sodium chloride flush  3 mL Intravenous Q12H  . thiamine  100 mg Per Tube Daily   Continuous Infusions: . sodium chloride Stopped (07/29/19 1654)  . dextrose 5 % and 0.9 % NaCl  with KCl 40 mEq/L    . feeding supplement (OSMOLITE 1.5 CAL) Stopped (08/13/19 1233)  . magnesium sulfate bolus IVPB       LOS: 20 days    Time spent: [place], DO [facility] PAGER is on [place]  If 7PM-7AM, please contact night-coverage [email or web address] Password [facility] 08/17/2019, 7:48  AM

## 2019-08-17 NOTE — TOC Progression Note (Signed)
Transition of Care (TOC) - Progression Note    Patient Details  Name: George Wade. MRN: [id number] Date of Birth: 1955/12/01  Transition of Care [facility]) CM/SW Contact  Ross Ludwig, [place] Phone Number: 08/17/2019, 6:00 PM  Clinical Narrative:     Patient not medically ready for discharge yet, continuing to follow patient's progress throughout discharge plannning.        Expected Discharge Plan and Services                                                 Social Determinants of Health (SDOH) Interventions    Readmission Risk Interventions No flowsheet data found.

## 2019-08-17 NOTE — Plan of Care (Signed)
  Problem: Education: Goal: Knowledge of General Education information will improve Description: Including pain rating scale, medication(s)/side effects and non-pharmacologic comfort measures Outcome: Progressing   Problem: Health Behavior/Discharge Planning: Goal: Ability to manage health-related needs will improve Outcome: Progressing   Problem: Clinical Measurements: Goal: Ability to maintain clinical measurements within normal limits will improve Outcome: Progressing Goal: Will remain free from infection Outcome: Progressing Note: Remains afebrile Goal: Respiratory complications will improve Outcome: Progressing Goal: Cardiovascular complication will be avoided Outcome: Progressing   Problem: Activity: Goal: Risk for activity intolerance will decrease Outcome: Progressing   Problem: Nutrition: Goal: Adequate nutrition will be maintained Outcome: Progressing   Problem: Coping: Goal: Level of anxiety will decrease Outcome: Progressing   Problem: Elimination: Goal: Will not experience complications related to bowel motility Outcome: Progressing   Problem: Clinical Measurements: Goal: Diagnostic test results will improve Outcome: Not Progressing Note: MG 1.6 this morning, replaced IV.  K 3.0 IV potassium added to fluids

## 2019-08-17 NOTE — Progress Notes (Signed)
Back from Specials, Gtube placement successful.  Dressing dry and intact, no drainage noted.  Sitter at bedside.  CB in reach, SR up x 4.

## 2019-08-17 NOTE — Progress Notes (Signed)
Spoke with [name] re G tube usage, we are to use tube only for "liquid" medication today.  Re evaluate tomorrow for feedings.

## 2019-08-17 NOTE — Progress Notes (Addendum)
Pharmacy Electrolyte Monitoring Consult:  63 y.o. male admitted on 07/28/2019 with Cough and Chest Pain related to an acute PE.  Because of his alcohol use disorder he remains at a high risk for refeeding syndrome.   Patient has aspirated several times and is unable to take PO meds. He had a PEG tube placed 10/26 and tube feeds were initiated 10/27, but are now being held d/t nausea and vomiting.  Patient pulled out PEG tube on 10/30 - NPO currently - will verify   PEG tube replacement and adjust electrolyte replenishment if needed.   Labs:  Sodium (mmol/L)  Date Value  08/17/2019 136   Potassium (mmol/L)  Date Value  08/17/2019 3.0 (L)   Magnesium (mg/dL)  Date Value  08/17/2019 1.6 (L)   Phosphorus (mg/dL)  Date Value  08/17/2019 3.2   Calcium (mg/dL)  Date Value  08/17/2019 8.2 (L)   Albumin (g/dL)  Date Value  08/16/2019 2.8 (L)   Plan:  IVF changed to D5NS with 99meq KCL @ 77ml/hr (this will provide 72 mEq potassium in 24 hours)    Will give magnesium IV bolus 2g x 1   Recheck BMP/mag tomorrow with AM labs  Pharmacy will continue to follow due to pt lack of nutrition.  Lu Duffel, PharmD, BCPS Clinical Pharmacist 08/17/2019 7:43 AM

## 2019-08-17 NOTE — Progress Notes (Signed)
PT Cancellation Note  Patient Details Name: George Wade. MRN: [id number] DOB: [birth year]   Cancelled Treatment:    Reason Eval/Treat Not Completed: Other (comment). Pt currently off floor receiving new PEG tube this date. Discussed with RN. Will need new PT order to resume care secondary to change in status.   Stephine Langbehn 08/17/2019, 9:52 AM  Greggory Stallion, PT, DPT [phone]

## 2019-08-17 NOTE — Procedures (Signed)
  Procedure: Placement of gastrostomy catheter 36F   EBL:   minimal Complications:  none immediate  See full dictation in [facility].  Dillard Cannon MD Main # [phone] Pager  [phone]

## 2019-08-17 NOTE — Progress Notes (Addendum)
[place] for Apixaban Indication: pulmonary embolus  Allergies  Allergen Reactions  . Sulfa Antibiotics Nausea Only    Patient Measurements: Height: 5\' 8"  (172.7 cm) Weight: 158 lb 1.6 oz (71.7 kg) IBW/kg (Calculated) : 68.4   Vital Signs: Temp: 98.4 F (36.9 C) (11/06 0931) Temp Source: Oral (11/06 0931) BP: 126/95 (11/06 1127) Pulse Rate: 94 (11/06 1127)  Labs: Recent Labs    08/16/19 0006 08/16/19 0448 08/17/19 0627  HGB  --  10.7* 9.7*  HCT  --  36.7* 32.9*  PLT  --  382 352  APTT  --   --  34  LABPROT  --   --  15.0  INR  --   --  1.2  CREATININE 0.53* 0.60* 0.46*    Estimated Creatinine Clearance: 91.4 mL/min (A) (by C-G formula based on SCr of 0.46 mg/dL (L)).   Assessment: 63 yo male temporarily on Lovenox for PE while NPO transitioning back to apixaban  Goal of Therapy:  Monitor platelets by anticoagulation protocol: Yes   Plan:  Patient started on apixaban for PE. Patient unable to take PO meds. Apixaban was switched to therapeutic Lovenox until patient able to tolerate PO meds.   Patient PEG tube replaced today. Will now resume Eliquis therapy.  (Addendum - per IR/Nursing - only liquid meds today, will dose enoxaparin today and resume Eliquis tomorrow)  Pharmacy will continue to follow.   Lu Duffel, PharmD, BCPS Clinical Pharmacist 08/17/2019 11:36 AM

## 2019-08-17 NOTE — Progress Notes (Signed)
To Specials via bed 

## 2019-08-18 ENCOUNTER — Inpatient Hospital Stay: Payer: Self-pay

## 2019-08-18 DIAGNOSIS — Z931 Gastrostomy status: Secondary | ICD-10-CM

## 2019-08-18 LAB — CBC
HCT: 33.9 % — ABNORMAL LOW (ref 39.0–52.0)
Hemoglobin: 10.2 g/dL — ABNORMAL LOW (ref 13.0–17.0)
MCH: 25.3 pg — ABNORMAL LOW (ref 26.0–34.0)
MCHC: 30.1 g/dL (ref 30.0–36.0)
MCV: 84.1 fL (ref 80.0–100.0)
Platelets: 340 10*3/uL (ref 150–400)
RBC: 4.03 MIL/uL — ABNORMAL LOW (ref 4.22–5.81)
RDW: 24.5 % — ABNORMAL HIGH (ref 11.5–15.5)
WBC: 4.2 10*3/uL (ref 4.0–10.5)
nRBC: 0 % (ref 0.0–0.2)

## 2019-08-18 LAB — BASIC METABOLIC PANEL
Anion gap: 8 (ref 5–15)
BUN: <5 mg/dL — ABNORMAL LOW (ref 8–23)
CO2: 23 mmol/L (ref 22–32)
Calcium: 8.4 mg/dL — ABNORMAL LOW (ref 8.9–10.3)
Chloride: 107 mmol/L (ref 98–111)
Creatinine, Ser: 0.47 mg/dL — ABNORMAL LOW (ref 0.61–1.24)
GFR calc Af Amer: >60 mL/min (ref >60)
GFR calc non Af Amer: >60 mL/min (ref >60)
Glucose, Bld: 95 mg/dL (ref 70–99)
Potassium: 3.6 mmol/L (ref 3.5–5.1)
Sodium: 138 mmol/L (ref 135–145)

## 2019-08-18 LAB — PHOSPHORUS: Phosphorus: 3.1 mg/dL (ref 2.5–4.6)

## 2019-08-18 LAB — MAGNESIUM: Magnesium: 1.9 mg/dL (ref 1.7–2.4)

## 2019-08-18 MED ORDER — FREE WATER
150.0000 mL | Status: DC
  Administered 2019-08-18 – 2019-08-28 (×58): 150 mL

## 2019-08-18 MED ORDER — OSMOLITE 1.5 CAL PO LIQD
1000.0000 mL | ORAL | Status: DC
  Administered 2019-08-18: 1000 mL

## 2019-08-18 NOTE — Progress Notes (Signed)
AM meds held until PEG approved for use. After xray, given the verbal okay to use by Dr. Blaine Hamper. Writer flushed PEG with 62mL sterile water; tube flushes easily to gravity. Dr. Blaine Hamper aware. Will obtain Osmolite from dietary and begin feeds.   Kangaroo pump set up with osmolite 1.5 and water flushes.

## 2019-08-18 NOTE — Progress Notes (Signed)
Upon assessing patient this morning; noted to be A&O to person, place, situation, and time. However, as Probation officer conversed with patient and asked about his fall here in the hospital, patient reported his fall was at home. Per patient: "I was at home with my girlfriend, and she had a high chair. It had a stainless steel tray, and she dropped it on the floor. Then when I came in the kitchen, I stepped on the tray and it slid across the linoleum, because it's slick you know? Anyway, I fell and got pretty banged up."  Writer asked patient if he remembers falling in the hospital, and he stated he has not fallen here at [facility]. Patient is aware of immediate surroundings and at this time the 1:1 sitter Hoyle Sauer does not report (nor does Probation officer note any) patient anxiety or agitation. Will continue to monitor.

## 2019-08-18 NOTE — Progress Notes (Addendum)
PROGRESS NOTE    George Wade.  KXF:[id number] DOB: [birth year] DOA: 07/28/2019 PCP: Patient, [name] Per    Brief Narrative:   George Wade  is a 63 y.o. male with a known history of new medical problems as below. The patient presents the ED with above chief complaints.  The patient presents the ED with above chief complaints.  He has had chest pain in the central area, which is extirpated by cough and swallowing food.  He also complains of shortness breath and cough.  He has been drinking alcohol on daily basis.  The last drink was yesterday.  He denies any tremors or shaking.  He is found elevated d-dimer and CT angiogram of the chest report right-sided PE.  [name] requested admission and will start heparin drip.  Interim History: -PEG tube is placed by [name] from IR 11/6 -X-ray of abdomen showed PEG tube position Ok   Assessment & Plan:   Principal Problem:   Alcohol-induced mood disorder ([place]) Active Problems:   Wernicke's encephalopathy   Alcohol dependence with uncomplicated intoxication ([place])   Pulmonary emboli ([facility])   Nausea & vomiting   Malnutrition of moderate degree   Problems with swallowing and mastication   Stricture and stenosis of esophagus   Fall   Elevated LFTs   Esophageal stricture   Aspiration into airway   PEG (percutaneous endoscopic gastrostomy) status ([place])   Acute hypoxic and hypercapnic respiratory failure due to aspiration pneumonia-As previously noted,weaned off oxygen with documented oxygen saturation of 97% on room air this morning.  -Patient was treated with IV Unasyn. -Continue duonebsnebulization therapy for now -PT consult for possible SNF placement  Dysphagia- having severe aspiration episodes: GI was consulted. EGD 10/22 with benign-appearing for esophageal stenosis that was dilated. Patient status post PEG tube placement by interventional radiologist on 08/06/2019. Patient has pulled out PEG tube on 08/10/2019  in the evening. IR was consulted for PEG placement. [name] said that pt just had PEG tube placement on 10/26. Pt pulled out the tube. It is not appropriate and is risky to put PEG tube again. Pt is likely to pull out the tube again. He recommended to consider other way of feeing pt. TPN? Very difficult situation. I am hoping his mental status improves and then try to put PEG tube again. On 08/16/19,  his mental status improved when I saw patient in the morning.  He is orientated x3.  He answered all questions appropriately.  He even promised me that he will never pull out PEG tube if it is placed again. Since pt's mental status improved. Re-consulted IR, [name] placed PEG tube 11/6 which is highly appreciated. X-ray of abdomen showed PEG tube position is OK.   -will start tube feeding -d/c IV NS-d5 at 75 cc/h -Monitor refeeding syndrome, check magnesium, BMP, phosphorus  Malnutrition of moderate degree: -started tube feeding -consult nutrition  Small right lung pulmonary emboli-  -restart Eliquis and d/c levenox  Chronic iron deficiency anemia- hemoglobin is lower than baseline. Hgb 9.3, 8.8,10.6 -->10.2 -s/p IV iron x1 on 10/18 -Continue to monitor -Stable currently   DTs with history of EtOH abuse, and alcohol-induced mood disorder and wernicke's encephalopathy:  Patient weaned off Precedex drip and transferred out of ICU. Psychiatry consultedfor evaluation of psychosis-appreciate their input-request that they reevaluate secondary to behavioral nonadherence versus delirium. Psychiatry wrote for Haldol as needed on initial consultation -Continue thiamine, folate and multivitamin.  -CIWA protocol  Elevated LFTs-  likely due to alcohol use. AST/ALT improved. Right upper quadrant ultrasound showed hepatic steatosis -Monitor  Tobacco use -Nicotine patch ordered  Fall with lip laceration: Oral cavity lip laceration now 12 hours out from injury with small tooth fragment in  depths of wound.  [name] from ENT was consulted. "I offered the patient the option of numbing the area and probing the depths of the wound to see if I might locate this small dental fragment, and possibly placing a couple of loose stiches to better align this flap of tissue but he refused". -appreciate [name] consultation. -started Augmentin 11/4 --> changed to unasyn -->will change back to Augmentin  Left hand cellulitis : Felt to be probably due to bug bite. Patient seen by orthopedic physician . No evidence of deeper infection that would require I&D at this time. Was treated with IV Unasyn recently,clinical monitoring. Appreciate their follow-up.   DVT prophylaxis: on Lovenos  Code Status: fall Family Communication: I called his sister by phone. Disposition Plan: pt need to go to SNF, when bed is available, patient can be discharged. Barriers for discharge:    Consultants:   ENT  Psychiatry  IR  Procedures:  EGD  PEG tube placement 11/6  Antimicrobials: Anti-infectives (From admission, onward)   Start     Dose/Rate Route Frequency Ordered Stop   08/18/19 1000  amoxicillin-clavulanate (AUGMENTIN) 875-125 MG per tablet 1 tablet     1 tablet Oral Every 12 hours 08/17/19 1309     08/17/19 2200  amoxicillin-clavulanate (AUGMENTIN) 875-125 MG per tablet 1 tablet  Status:  Discontinued     1 tablet Per Tube Every 12 hours 08/17/19 1132 08/17/19 1309   08/17/19 1315  amoxicillin-clavulanate (AUGMENTIN) 400-57 MG/5ML suspension 800 mg     800 mg of amoxicillin Oral Every 12 hours 08/17/19 1309 08/17/19 2153   08/15/19 1215  amoxicillin-clavulanate (AUGMENTIN) 875-125 MG per tablet 1 tablet  Status:  Discontinued     1 tablet Oral Every 12 hours 08/15/19 1212 08/17/19 1132   08/06/19 1415  ceFAZolin (ANCEF) IVPB 2g/100 mL premix     2 g 200 mL/hr over 30 Minutes Intravenous  Once 08/06/19 1403 08/06/19 1530   08/06/19 1406  ceFAZolin (ANCEF) 2-4 GM/100ML-% IVPB     Note to Pharmacy: Fransico Michael   : cabinet override      08/06/19 1406 08/07/19 0214   08/03/19 2000  fluconazole (DIFLUCAN) IVPB 200 mg  Status:  Discontinued     200 mg 100 mL/hr over 60 Minutes Intravenous Every 24 hours 08/03/19 1954 08/07/19 1115   07/31/19 0630  Ampicillin-Sulbactam (UNASYN) 3 g in sodium chloride 0.9 % 100 mL IVPB  Status:  Discontinued     3 g 200 mL/hr over 30 Minutes Intravenous Every 6 hours 07/31/19 0620 08/09/19 1011         Subjective:  pt feels hungry. Has somefright facial pain. Pt is alert, orientated x3.  Objective: Vitals:   08/17/19 1603 08/17/19 2012 08/18/19 0410 08/18/19 0817  BP: 109/85 (!) 113/94 (!) 122/91 (!) 127/105  Pulse: 97 (!) 101 89 (!) 103  Resp: 20 18 18 16   Temp: 98.6 F (37 C) (!) 97.4 F (36.3 C)  97.6 F (36.4 C)  TempSrc: Oral Oral  Oral  SpO2: 96% 94% 97% 97%  Weight:      Height:        Intake/Output Summary (Last 24 hours) at 08/18/2019 1708 Last data filed at 08/18/2019 0648 Gross per 24  hour  Intake -  Output 775 ml  Net -775 ml   Filed Weights   08/12/19 0500 08/13/19 0631 08/17/19 1126  Weight: 71.5 kg 71.7 kg 72.2 kg    Examination: Physical Exam:  General: Not in acute distress.  Dry mucosal membrane HEENT: PERRL, EOMI, no scleral icterus, No JVD or bruit. Has laceration of the interior of the upper lip with a flap of tissue. Cardiac: S1/S2, RRR, No murmurs, gallops or rubs Pulm: Clear to auscultation bilaterally. No rales, wheezing, rhonchi or rubs. Abd: Soft, nondistended, nontender, no rebound pain, no organomegaly, BS present Ext: No edema. 2+DP/PT pulse bilaterally Musculoskeletal: No joint deformities, erythema, or stiffness, ROM full Skin: No rashes.  Neuro: currently oriented X3, cranial nerves II-XII grossly intact. Psych: Patient is not psychotic, no suicidal or hemocidal ideation.    Data Reviewed: I have personally reviewed following labs and imaging studies  CBC: Recent Labs   Lab 08/14/19 0400 08/16/19 0448 08/17/19 0627 08/18/19 0458  WBC 9.2 8.5 4.4 4.2  HGB 10.6* 10.7* 9.7* 10.2*  HCT 35.9* 36.7* 32.9* 33.9*  MCV 83.5 86.6 83.7 84.1  PLT 405* 382 352 811   Basic Metabolic Panel: Recent Labs  Lab 08/13/19 0448 08/14/19 0400 08/15/19 0600 08/16/19 0006 08/16/19 0448 08/17/19 0627 08/18/19 0458  NA 139 138 140 140 140 136 138  K 3.4* 3.7 3.9 4.0 3.8 3.0* 3.6  CL 110 107 110 112* 111 107 107  CO2 20* 18* 16* 16* 15* 21* 23  GLUCOSE 73 75 71 72 74 89 95  BUN <5* <5* <5* <5* <5* <5* <5*  CREATININE 0.45* 0.56* 0.67 0.53* 0.60* 0.46* 0.47*  CALCIUM 8.3* 8.9 8.8* 8.7* 8.6* 8.2* 8.4*  MG 1.9 1.8 2.2  --  2.0 1.6* 1.9  PHOS 3.2  --   --   --  3.7 3.2 3.1   GFR: Estimated Creatinine Clearance: 91.4 mL/min (A) (by C-G formula based on SCr of 0.47 mg/dL (L)). Liver Function Tests: Recent Labs  Lab 08/16/19 0006  AST 21  ALT 17  ALKPHOS 92  BILITOT 1.3*  PROT 5.9*  ALBUMIN 2.8*   No results for input(s): LIPASE, AMYLASE in the last 168 hours. No results for input(s): AMMONIA in the last 168 hours. Coagulation Profile: Recent Labs  Lab 08/17/19 0627  INR 1.2   Cardiac Enzymes: No results for input(s): CKTOTAL, CKMB, CKMBINDEX, TROPONINI in the last 168 hours. BNP (last 3 results) No results for input(s): PROBNP in the last 8760 hours. HbA1C: No results for input(s): HGBA1C in the last 72 hours. CBG: No results for input(s): GLUCAP in the last 168 hours. Lipid Profile: No results for input(s): CHOL, HDL, LDLCALC, TRIG, CHOLHDL, LDLDIRECT in the last 72 hours. Thyroid Function Tests: No results for input(s): TSH, T4TOTAL, FREET4, T3FREE, THYROIDAB in the last 72 hours. Anemia Panel: No results for input(s): VITAMINB12, FOLATE, FERRITIN, TIBC, IRON, RETICCTPCT in the last 72 hours. Sepsis Labs: No results for input(s): PROCALCITON, LATICACIDVEN in the last 168 hours.  No results found for this or any previous visit (from the past  240 hour(s)).    Radiology Studies: Dg Abd 1 View  Result Date: 08/18/2019 CLINICAL DATA:  PEG tube placement, placed 08/17/2019 and 08/06/2019. Per ordering physician no contrast administered. EXAM: ABDOMEN - 1 VIEW COMPARISON:  Abdominal radiograph 08/08/2019 FINDINGS: PEG tube is again visualized overlying the left upper quadrant. Patency cannot be assessed in the absence of contrast. There are no dilated loops of bowel. No  supine evidence for free air. Oral contrast is seen throughout multiple loops of bowel to the level of the rectum. Visualized lung bases are clear. No acute finding in the visualized skeleton. IMPRESSION: 1. PEG tube overlies the left upper quadrant. Patency cannot be assessed in the absence of contrast. 2. Nonobstructive bowel gas pattern. Oral contrast is noted throughout multiple loops of bowel. Electronically Signed   By: Audie Pinto M.D.   On: 08/18/2019 14:27   Ir Gastrostomy Tube Mod Sed  Result Date: 08/17/2019 CLINICAL DATA:  Esophageal stricture, needs enteral feeding support. Previously placed gastrostomy catheter was inadvertently removed. EXAM: PERC PLACEMENT GASTROSTOMY FLUOROSCOPY TIME:  3.8 minutes; 77.9 mGy TECHNIQUE: The procedure, risks, benefits, and alternatives were explained to the patient. Questions regarding the procedure were encouraged and answered. The patient understands and consents to the procedure. As antibiotic prophylaxis, cefazolin 1 g was ordered pre-procedure and administered intravenously within one hour of incision. Under intermittent fluoroscopy, a 5 French angiographic catheter was placed as orogastric tube. The upper abdomen was prepped with chlorhexidine, draped in usual sterile fashion, and infiltrated locally with 1% lidocaine. Intravenous Fentanyl 147mcg and Versed 2mg  were administered as conscious sedation during continuous monitoring of the patient's level of consciousness and physiological / cardiorespiratory status by the  radiology RN, with a total moderate sedation time of 18 minutes. 0.5 mg glucagon given IV to facilitate gastric distention.Stomach was insufflated using air through the orogastric tube. Attempts were made to pass an angled 5 French angiographic catheter through the previous tract under fluoroscopy using small amount of contrast with these were ultimately unsuccessful. Under fluoroscopic guidance between the 2 previously placed gastropexy fasteners, an 18 gauge entry needle was advanced percutaneously into the gastric lumen under fluoroscopy. Gas could be aspirated and a small contrast injection confirmed intraluminal spread. Guidewire advanced easily, within the gastric lumen on fluoroscopy. Needle was exchanged over a guidewire for serial vascular dilators which allowed placement of a 18 French peel-away sheath, through which a 51 French balloon retention single-lumen gastrostomy catheter was advanced. The peel-away sheath was removed. The retention balloon was inflated with 7 mL sterile saline. Contrast injection confirms good positioning within the lumen of the stomach. The external bumper was applied and the catheter was flushed and capped. COMPLICATIONS: COMPLICATIONS none IMPRESSION: 1. Technically successful 30 French balloon-retention gastrostomy placement under fluoroscopy. Electronically Signed   By: Lucrezia Europe M.D.   On: 08/17/2019 12:59     Scheduled Meds: . amoxicillin-clavulanate  1 tablet Oral Q12H  . apixaban  5 mg Per Tube BID  . feeding supplement (PRO-STAT SUGAR FREE 64)  30 mL Per Tube Daily  . folic acid  1 mg Per Tube Daily  . pantoprazole (PROTONIX) IV  40 mg Intravenous Q12H  . sodium chloride flush  3 mL Intravenous Q12H  . thiamine  100 mg Per Tube Daily   Continuous Infusions: . sodium chloride Stopped (07/29/19 1654)  . feeding supplement (OSMOLITE 1.5 CAL)       LOS: 21 days    Time spent: [place], DO [facility] PAGER is on [place]  If  7PM-7AM, please contact night-coverage [email or web address] Password [email or web address] 08/18/2019, 5:08 PM

## 2019-08-18 NOTE — Progress Notes (Signed)
Pharmacy Electrolyte Monitoring Consult:  63 y.o. male admitted on 07/28/2019 with Cough and Chest Pain related to an acute PE.  Because of his alcohol use disorder he remains at a high risk for refeeding syndrome.   Patient has aspirated several times and is unable to take PO meds. He had a PEG tube placed 10/26 and tube feeds were initiated 10/27, but are now being held d/t nausea and vomiting.  Patient pulled out PEG tube on 10/30 - NPO currently    Labs:  Sodium (mmol/L)  Date Value  08/18/2019 138   Potassium (mmol/L)  Date Value  08/18/2019 3.6   Magnesium (mg/dL)  Date Value  08/18/2019 1.9   Phosphorus (mg/dL)  Date Value  08/18/2019 3.1   Calcium (mg/dL)  Date Value  08/18/2019 8.4 (L)   Albumin (g/dL)  Date Value  08/16/2019 2.8 (L)   Assessment:  Pt is on D5NS with 60meq KCL @ 11ml/hr. K+ 3.6.   Plan:  IVF changed to D5NS with 32meq KCL @ 51ml/hr (this will provide 72 mEq potassium in 24 hours)   Recheck BMP tomorrow with AM labs  Pharmacy will continue to follow due to pt lack of nutrition.  Oswald Hillock, PharmD, BCPS Clinical Pharmacist 08/18/2019 8:33 AM

## 2019-08-18 NOTE — Plan of Care (Signed)
  Problem: Education: Goal: Knowledge of General Education information will improve Description: Including pain rating scale, medication(s)/side effects and non-pharmacologic comfort measures Outcome: Progressing   Problem: Safety: Goal: Ability to remain free from injury will improve Outcome: Progressing   

## 2019-08-18 NOTE — Progress Notes (Addendum)
Feeding tube was initiated for the pt today at a rate of 54ml/hr, but no order on how often to flush. Page prime. Will continue to monitor.  Update 1955: Talked to [facility] NP and ordered to flush feeding osmolite 1.5 CAL at 150 ml every 4 hours. Will continue to monitor.  Update 0157; Rate of fedding tube was change at 40 ml/hr. Pt tolerating the feeding tube well. Will continue to monitor.

## 2019-08-19 LAB — MAGNESIUM: Magnesium: 1.7 mg/dL (ref 1.7–2.4)

## 2019-08-19 LAB — BASIC METABOLIC PANEL
Anion gap: 8 (ref 5–15)
BUN: <5 mg/dL — ABNORMAL LOW (ref 8–23)
CO2: 23 mmol/L (ref 22–32)
Calcium: 8.3 mg/dL — ABNORMAL LOW (ref 8.9–10.3)
Chloride: 106 mmol/L (ref 98–111)
Creatinine, Ser: 0.44 mg/dL — ABNORMAL LOW (ref 0.61–1.24)
GFR calc Af Amer: >60 mL/min (ref >60)
GFR calc non Af Amer: >60 mL/min (ref >60)
Glucose, Bld: 124 mg/dL — ABNORMAL HIGH (ref 70–99)
Potassium: 3.4 mmol/L — ABNORMAL LOW (ref 3.5–5.1)
Sodium: 137 mmol/L (ref 135–145)

## 2019-08-19 LAB — PHOSPHORUS: Phosphorus: 3.1 mg/dL (ref 2.5–4.6)

## 2019-08-19 MED ORDER — SODIUM CHLORIDE 0.9 % IV BOLUS
1000.0000 mL | Freq: Once | INTRAVENOUS | Status: AC
  Administered 2019-08-19: 1000 mL via INTRAVENOUS

## 2019-08-19 MED ORDER — OSMOLITE 1.5 CAL PO LIQD
1000.0000 mL | ORAL | Status: AC
  Administered 2019-08-19 – 2019-08-24 (×5): 1000 mL

## 2019-08-19 MED ORDER — POLYVINYL ALCOHOL 1.4 % OP SOLN
1.0000 [drp] | OPHTHALMIC | Status: DC | PRN
  Administered 2019-08-19: 1 [drp] via OPHTHALMIC
  Filled 2019-08-19: qty 15

## 2019-08-19 MED ORDER — POTASSIUM CHLORIDE 20 MEQ/15ML (10%) PO SOLN
60.0000 meq | Freq: Once | ORAL | Status: AC
  Administered 2019-08-19: 11:00:00 60 meq
  Filled 2019-08-19: qty 45

## 2019-08-19 MED ORDER — MAGNESIUM SULFATE 4 GM/100ML IV SOLN
4.0000 g | Freq: Once | INTRAVENOUS | Status: AC
  Administered 2019-08-19: 4 g via INTRAVENOUS
  Filled 2019-08-19: qty 100

## 2019-08-19 NOTE — Progress Notes (Signed)
Patient c/o nausea + vomiting early afternoon; upon assessment, patient noted to be lying flat in the bed. Writer reminded patient he needed the head of his bed up; showed 1:1 sitter how to ensure low bed was >30 degrees. Zofran given x1, will continue to monitor. Dr. Blaine Hamper aware, no new orders at this time.

## 2019-08-19 NOTE — Progress Notes (Addendum)
PROGRESS NOTE    George Wade.  DXI:[id number] DOB: Nov 17, 1955 DOA: 07/28/2019 PCP: Patient, [name] Per    Brief Narrative:   George Wade  is a 63 y.o. male with a known history of new medical problems as below. The patient presents the ED with above chief complaints.  The patient presents the ED with above chief complaints.  He has had chest pain in the central area, which is extirpated by cough and swallowing food.  He also complains of shortness breath and cough.  He has been drinking alcohol on daily basis.  The last drink was yesterday.  He denies any tremors or shaking.  He is found elevated d-dimer and CT angiogram of the chest report right-sided PE.  [name] requested admission and will start heparin drip.  Interim History: -PEG tube is placed by [name] from IR 11/6 -X-ray of abdomen showed PEG tube position [facility] 11/7 -11/8: start tube feeding, pt has nausea and vomited several times with nonbiliary nonbloody vomitus.  No diarrhea or abdominal pain.   Assessment & Plan:   Principal Problem:   Alcohol-induced mood disorder (HCC) Active Problems:   Wernicke's encephalopathy   Alcohol dependence with uncomplicated intoxication ([place])   Pulmonary emboli (HCC)   Nausea & vomiting   Malnutrition of moderate degree   Problems with swallowing and mastication   Stricture and stenosis of esophagus   Fall   Elevated LFTs   Esophageal stricture   Aspiration into airway   PEG (percutaneous endoscopic gastrostomy) status ([place])   Acute hypoxic and hypercapnic respiratory failure due to aspiration pneumonia-resomved. Weaned off oxygen with documented oxygen saturation of 97% on room air this morning.  -Patient was treated with IV Unasyn. -Continue duonebsnebulization therapy for now -PT consult for possible SNF placement  Dysphagia- having severe aspiration episodes: GI was consulted. EGD 10/22 with benign-appearing for esophageal stenosis that was dilated.  Patient status post PEG tube placement by interventional radiologist on 08/06/2019. Patient has pulled out PEG tube on 08/10/2019 in the evening. IR was consulted for PEG placement. [name] said that pt just had PEG tube placement on 10/26. Pt pulled out the tube. It is not appropriate and is risky to put PEG tube again. Pt is likely to pull out the tube again. He recommended to consider other way of feeing pt. TPN? Very difficult situation. I am hoping his mental status improves and then try to put PEG tube again. On 08/16/19,  his mental status improved when I saw patient in the morning.  He is orientated x3.  He answered all questions appropriately.  He even promised me that he will never pull out PEG tube if it is placed again. Since pt's mental status improved. Re-consulted IR, [name] placed PEG tube 11/6 which is highly appreciated. X-ray of abdomen showed PEG tube position is OK.   1. started tube feeding 11/7. Has some nausea no vomiting, no abdominal pain or diarrhea.  Phosphorus is 3.1, magnesium 1.7, will give 4 g of magnesium sulfate IV 2. IVF, 1L NS bolus -Monitor refeeding syndrome, check magnesium, BMP, phosphorus  Malnutrition of moderate degree: -started tube feeding 11/7 -consult nutrition  Small right lung pulmonary emboli-  -restarted Eliquis 11/7 and d/c'ed levenox  Chronic iron deficiency anemia- hemoglobin is lower than baseline. Hgb 9.3, 8.8,10.6 -->10.2-->10.2 -s/p IV iron x1 on 10/18 -Continue to monitor -Stable currently   DTs with history of EtOH abuse, and alcohol-induced mood disorder and wernicke's encephalopathy:  Patient weaned off Precedex drip and transferred out of ICU. Psychiatry consultedfor evaluation of psychosis-appreciate their input-request that they reevaluate secondary to behavioral nonadherence versus delirium. Psychiatry wrote for Haldol as needed on initial consultation -Continue thiamine, folate and multivitamin.   Elevated LFTs-  likely due to alcohol use. AST/ALT improved. Right upper quadrant ultrasound showed hepatic steatosis -Monitor  Tobacco use -Nicotine patch ordered  Fall with lip laceration: Oral cavity lip laceration now 12 hours out from injury with small tooth fragment in depths of wound.  [name] from ENT was consulted. "I offered the patient the option of numbing the area and probing the depths of the wound to see if I might locate this small dental fragment, and possibly placing a couple of loose stiches to better align this flap of tissue but he refused". -appreciate [name] consultation. -started Augmentin 11/4 --> changed to unasyn -->Changed back to Augmentin  Left hand cellulitis: resolved. Felt to be probably due to bug bite. Patient seen by orthopedic physician . No evidence of deeper infection that would require I&D at this time. Was treated with IV Unasyn recently,clinical monitoring. Appreciate their follow-up.   DVT prophylaxis: on Lovenos  Code Status: fall Family Communication: I called his sister by phone. Disposition Plan: pt need to go to SNF, when bed is available, patient can be discharged. Barriers for discharge:    Consultants:   ENT  Psychiatry  IR  Procedures:  EGD  PEG tube placement 11/6  Antimicrobials: Anti-infectives (From admission, onward)   Start     Dose/Rate Route Frequency Ordered Stop   08/18/19 1000  amoxicillin-clavulanate (AUGMENTIN) 875-125 MG per tablet 1 tablet     1 tablet Oral Every 12 hours 08/17/19 1309     08/17/19 2200  amoxicillin-clavulanate (AUGMENTIN) 875-125 MG per tablet 1 tablet  Status:  Discontinued     1 tablet Per Tube Every 12 hours 08/17/19 1132 08/17/19 1309   08/17/19 1315  amoxicillin-clavulanate (AUGMENTIN) 400-57 MG/5ML suspension 800 mg     800 mg of amoxicillin Oral Every 12 hours 08/17/19 1309 08/17/19 2153   08/15/19 1215  amoxicillin-clavulanate (AUGMENTIN) 875-125 MG per tablet 1 tablet  Status:   Discontinued     1 tablet Oral Every 12 hours 08/15/19 1212 08/17/19 1132   08/06/19 1415  ceFAZolin (ANCEF) IVPB 2g/100 mL premix     2 g 200 mL/hr over 30 Minutes Intravenous  Once 08/06/19 1403 08/06/19 1530   08/06/19 1406  ceFAZolin (ANCEF) 2-4 GM/100ML-% IVPB    Note to Pharmacy: Fransico Michael   : cabinet override      08/06/19 1406 08/07/19 0214   08/03/19 2000  fluconazole (DIFLUCAN) IVPB 200 mg  Status:  Discontinued     200 mg 100 mL/hr over 60 Minutes Intravenous Every 24 hours 08/03/19 1954 08/07/19 1115   07/31/19 0630  Ampicillin-Sulbactam (UNASYN) 3 g in sodium chloride 0.9 % 100 mL IVPB  Status:  Discontinued     3 g 200 mL/hr over 30 Minutes Intravenous Every 6 hours 07/31/19 0620 08/09/19 1011         Subjective:  pt has nausea and vomiting with nonbilious nonbloody vomitus after starting tube feeding.  No abdominal pain or diarrhea.  No fever or chills.  Has somefright facial pain. Pt is alert, orientated x3.  Objective: Vitals:   08/18/19 1811 08/18/19 1904 08/19/19 0602 08/19/19 0731  BP: (!) 130/93 99/83 103/80 105/84  Pulse: 92 97 98 100  Resp: 20 20 18  19  Temp: 98.4 F (36.9 C) 98.4 F (36.9 C) 98.5 F (36.9 C) 99 F (37.2 C)  TempSrc: Oral Oral  Oral  SpO2: 95% 96% 94% 97%  Weight:   83.5 kg   Height:        Intake/Output Summary (Last 24 hours) at 08/19/2019 1456 Last data filed at 08/19/2019 1312 Gross per 24 hour  Intake 2149.63 ml  Output 1300 ml  Net 849.63 ml   Filed Weights   08/13/19 0631 08/17/19 1126 08/19/19 0602  Weight: 71.7 kg 72.2 kg 83.5 kg    Examination: Physical Exam:  General: Not in acute distress.  Dry mucosal membrane HEENT: PERRL, EOMI, no scleral icterus, No JVD or bruit. Has laceration of the interior of the upper lip with a flap of tissue. Cardiac: S1/S2, RRR, No murmurs, gallops or rubs Pulm: Clear to auscultation bilaterally. No rales, wheezing, rhonchi or rubs. Abd: Soft, nondistended, nontender, no  rebound pain, no organomegaly, BS present Ext: No edema. 2+DP/PT pulse bilaterally Musculoskeletal: No joint deformities, erythema, or stiffness, ROM full Skin: No rashes.  Neuro: currently oriented X3, cranial nerves II-XII grossly intact. Psych: Patient is not psychotic, no suicidal or hemocidal ideation.    Data Reviewed: I have personally reviewed following labs and imaging studies  CBC: Recent Labs  Lab 08/14/19 0400 08/16/19 0448 08/17/19 0627 08/18/19 0458  WBC 9.2 8.5 4.4 4.2  HGB 10.6* 10.7* 9.7* 10.2*  HCT 35.9* 36.7* 32.9* 33.9*  MCV 83.5 86.6 83.7 84.1  PLT 405* 382 352 527   Basic Metabolic Panel: Recent Labs  Lab 08/13/19 0448  08/15/19 0600 08/16/19 0006 08/16/19 0448 08/17/19 0627 08/18/19 0458 08/19/19 0554  NA 139   < > 140 140 140 136 138 137  K 3.4*   < > 3.9 4.0 3.8 3.0* 3.6 3.4*  CL 110   < > 110 112* 111 107 107 106  CO2 20*   < > 16* 16* 15* 21* 23 23  GLUCOSE 73   < > 71 72 74 89 95 124*  BUN <5*   < > <5* <5* <5* <5* <5* <5*  CREATININE 0.45*   < > 0.67 0.53* 0.60* 0.46* 0.47* 0.44*  CALCIUM 8.3*   < > 8.8* 8.7* 8.6* 8.2* 8.4* 8.3*  MG 1.9   < > 2.2  --  2.0 1.6* 1.9 1.7  PHOS 3.2  --   --   --  3.7 3.2 3.1 3.1   < > = values in this interval not displayed.   GFR: Estimated Creatinine Clearance: 99.5 mL/min (A) (by C-G formula based on SCr of 0.44 mg/dL (L)). Liver Function Tests: Recent Labs  Lab 08/16/19 0006  AST 21  ALT 17  ALKPHOS 92  BILITOT 1.3*  PROT 5.9*  ALBUMIN 2.8*   No results for input(s): LIPASE, AMYLASE in the last 168 hours. No results for input(s): AMMONIA in the last 168 hours. Coagulation Profile: Recent Labs  Lab 08/17/19 0627  INR 1.2   Cardiac Enzymes: No results for input(s): CKTOTAL, CKMB, CKMBINDEX, TROPONINI in the last 168 hours. BNP (last 3 results) No results for input(s): PROBNP in the last 8760 hours. HbA1C: No results for input(s): HGBA1C in the last 72 hours. CBG: No results for  input(s): GLUCAP in the last 168 hours. Lipid Profile: No results for input(s): CHOL, HDL, LDLCALC, TRIG, CHOLHDL, LDLDIRECT in the last 72 hours. Thyroid Function Tests: No results for input(s): TSH, T4TOTAL, FREET4, T3FREE, THYROIDAB in the last  72 hours. Anemia Panel: No results for input(s): VITAMINB12, FOLATE, FERRITIN, TIBC, IRON, RETICCTPCT in the last 72 hours. Sepsis Labs: No results for input(s): PROCALCITON, LATICACIDVEN in the last 168 hours.  No results found for this or any previous visit (from the past 240 hour(s)).    Radiology Studies: Dg Abd 1 View  Result Date: 08/18/2019 CLINICAL DATA:  PEG tube placement, placed 08/17/2019 and 08/06/2019. Per ordering physician no contrast administered. EXAM: ABDOMEN - 1 VIEW COMPARISON:  Abdominal radiograph 08/08/2019 FINDINGS: PEG tube is again visualized overlying the left upper quadrant. Patency cannot be assessed in the absence of contrast. There are no dilated loops of bowel. No supine evidence for free air. Oral contrast is seen throughout multiple loops of bowel to the level of the rectum. Visualized lung bases are clear. No acute finding in the visualized skeleton. IMPRESSION: 1. PEG tube overlies the left upper quadrant. Patency cannot be assessed in the absence of contrast. 2. Nonobstructive bowel gas pattern. Oral contrast is noted throughout multiple loops of bowel. Electronically Signed   By: Audie Pinto M.D.   On: 08/18/2019 14:27     Scheduled Meds: . amoxicillin-clavulanate  1 tablet Oral Q12H  . apixaban  5 mg Per Tube BID  . feeding supplement (PRO-STAT SUGAR FREE 64)  30 mL Per Tube Daily  . folic acid  1 mg Per Tube Daily  . free water  150 mL Per Tube Q4H  . pantoprazole (PROTONIX) IV  40 mg Intravenous Q12H  . sodium chloride flush  3 mL Intravenous Q12H  . thiamine  100 mg Per Tube Daily   Continuous Infusions: . sodium chloride 250 mL (08/19/19 1058)  . feeding supplement (OSMOLITE 1.5 CAL) 1,000 mL  (08/19/19 1100)     LOS: 22 days    Time spent: [place], DO [facility] PAGER is on [place]  If 7PM-7AM, please contact night-coverage [email or web address] Password [facility] 08/19/2019, 2:56 PM

## 2019-08-20 DIAGNOSIS — K625 Hemorrhage of anus and rectum: Secondary | ICD-10-CM | POA: Diagnosis present

## 2019-08-20 DIAGNOSIS — K6289 Other specified diseases of anus and rectum: Secondary | ICD-10-CM | POA: Diagnosis present

## 2019-08-20 LAB — PHOSPHORUS: Phosphorus: 3.3 mg/dL (ref 2.5–4.6)

## 2019-08-20 LAB — BASIC METABOLIC PANEL
Anion gap: 6 (ref 5–15)
BUN: 5 mg/dL — ABNORMAL LOW (ref 8–23)
CO2: 26 mmol/L (ref 22–32)
Calcium: 8.3 mg/dL — ABNORMAL LOW (ref 8.9–10.3)
Chloride: 109 mmol/L (ref 98–111)
Creatinine, Ser: 0.5 mg/dL — ABNORMAL LOW (ref 0.61–1.24)
GFR calc Af Amer: >60 mL/min (ref >60)
GFR calc non Af Amer: >60 mL/min (ref >60)
Glucose, Bld: 95 mg/dL (ref 70–99)
Potassium: 4.1 mmol/L (ref 3.5–5.1)
Sodium: 141 mmol/L (ref 135–145)

## 2019-08-20 LAB — CBC
HCT: 34.6 % — ABNORMAL LOW (ref 39.0–52.0)
HCT: 35.5 % — ABNORMAL LOW (ref 39.0–52.0)
HCT: 37.6 % — ABNORMAL LOW (ref 39.0–52.0)
Hemoglobin: 9.9 g/dL — ABNORMAL LOW (ref 13.0–17.0)
Hemoglobin: 11 g/dL — ABNORMAL LOW (ref 13.0–17.0)
Hemoglobin: 11.1 g/dL — ABNORMAL LOW (ref 13.0–17.0)
MCH: 24.9 pg — ABNORMAL LOW (ref 26.0–34.0)
MCH: 25.3 pg — ABNORMAL LOW (ref 26.0–34.0)
MCH: 25.3 pg — ABNORMAL LOW (ref 26.0–34.0)
MCHC: 28.6 g/dL — ABNORMAL LOW (ref 30.0–36.0)
MCHC: 31 g/dL (ref 30.0–36.0)
MCHC: 29.5 g/dL — ABNORMAL LOW (ref 30.0–36.0)
MCV: 87.2 fL (ref 80.0–100.0)
MCV: 81.8 fL (ref 80.0–100.0)
MCV: 85.8 fL (ref 80.0–100.0)
Platelets: 319 10*3/uL (ref 150–400)
Platelets: 356 10*3/uL (ref 150–400)
Platelets: 354 10*3/uL (ref 150–400)
RBC: 3.97 MIL/uL — ABNORMAL LOW (ref 4.22–5.81)
RBC: 4.34 MIL/uL (ref 4.22–5.81)
RBC: 4.38 MIL/uL (ref 4.22–5.81)
RDW: 24.7 % — ABNORMAL HIGH (ref 11.5–15.5)
RDW: 24.6 % — ABNORMAL HIGH (ref 11.5–15.5)
RDW: 24.4 % — ABNORMAL HIGH (ref 11.5–15.5)
WBC: 4.6 10*3/uL (ref 4.0–10.5)
WBC: 7.9 10*3/uL (ref 4.0–10.5)
WBC: 7.5 10*3/uL (ref 4.0–10.5)
nRBC: 0 % (ref 0.0–0.2)
nRBC: 0 % (ref 0.0–0.2)
nRBC: 0 % (ref 0.0–0.2)

## 2019-08-20 LAB — TYPE AND SCREEN
ABO/RH(D): B POS
Antibody Screen: NEGATIVE

## 2019-08-20 LAB — MAGNESIUM: Magnesium: 2.5 mg/dL — ABNORMAL HIGH (ref 1.7–2.4)

## 2019-08-20 MED ORDER — PANTOPRAZOLE SODIUM 40 MG PO PACK
40.0000 mg | PACK | Freq: Two times a day (BID) | ORAL | Status: DC
  Administered 2019-08-20 – 2019-08-29 (×17): 40 mg
  Filled 2019-08-20 (×25): qty 20

## 2019-08-20 NOTE — Plan of Care (Signed)
  Problem: Education: Goal: Knowledge of General Education information will improve Description: Including pain rating scale, medication(s)/side effects and non-pharmacologic comfort measures Outcome: Progressing   Problem: Pain Managment: Goal: General experience of comfort will improve Outcome: Progressing   Problem: Safety: Goal: Ability to remain free from injury will improve Outcome: Progressing   

## 2019-08-20 NOTE — Progress Notes (Addendum)
PROGRESS NOTE    George Wade.  CWC:[id number] DOB: Nov 02, 1955 DOA: 07/28/2019 PCP: Patient, [name] Per    Brief Narrative:   George Wade  is a 63 y.o. male with a known history of new medical problems as below. The patient presents the ED with above chief complaints.  The patient presents the ED with above chief complaints.  He has had chest pain in the central area, which is extirpated by cough and swallowing food.  He also complains of shortness breath and cough.  He has been drinking alcohol on daily basis.  The last drink was yesterday.  He denies any tremors or shaking.  He is found elevated d-dimer and CT angiogram of the chest report right-sided PE.  [name] requested admission and will start heparin drip.  Interim History: -PEG tube is placed by [name] from IR 11/6 -X-ray of abdomen showed PEG tube position [facility] 11/7 -11/8: start tube feeding, pt has nausea and vomited several times with nonbiliary nonbloody vomitus.  No diarrhea or abdominal pain. -11/9: pt had rectal bleeding, GI, [name] of GI, [name] of Surgery and [name] of Oncology were consulted. Pt was known by 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 No indication for biopsy at this time. he should be referred to a colorectal surgeon for evaluation of this. In September he preferred [facility].   -11/9: Hgb 9.9 -->11.0 -->11.1 - Mg 2.5, K 4.1, and phosphorus 3.3  Assessment & Plan:   Principal Problem:   Alcohol-induced mood disorder (HCC) Active Problems:   Wernicke's encephalopathy   Alcohol dependence with uncomplicated intoxication ([place])   Pulmonary emboli (HCC)   Nausea & vomiting   Malnutrition of moderate degree   Problems with swallowing and mastication   Stricture and stenosis of esophagus   Fall   Elevated LFTs   Esophageal stricture   Aspiration into airway   PEG (percutaneous endoscopic  gastrostomy) status (HCC)   Rectal bleeding   Rectal mass  Rectal bleeding and rectal mass  pt had rectal bleeding. GI, [name] of GI, [name] of Surgery and [name] of Oncology were consulted. Pt was known by [name] since pt was seen in our clinic in September of 2020. 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 No indication for biopsy at this time. he should be referred to a colorectal surgeon for evaluation of this. In September he preferred [facility].  Hgb stable. Hgb 9.9 -->11.0 -->11.1 -on protonix 40 orally bid - will continue Eliquis for now  -repeat CBC in AM and monitoring Hgb level -pt will need to be referred to a colorectal surgeon for evaluation of this. In September he preferred [facility].  Acute hypoxic and hypercapnic respiratory failure due to aspiration pneumonia-resomved. Weaned off oxygen with documented oxygen saturation of 97% on room air this morning.  -Patient was treated with IV Unasyn. -Continue duonebsnebulization therapy for now -PT consult for possible SNF placement  Dysphagia- having severe aspiration episodes: GI was consulted. EGD 10/22 with benign-appearing for esophageal stenosis that was dilated. Patient status post PEG tube placement by interventional radiologist on 08/06/2019. Patient has pulled out PEG tube on 08/10/2019 in the evening. IR was consulted for PEG placement. [name] said that pt just had PEG tube placement on 10/26. Pt pulled out the tube. It is not appropriate and  is risky to put PEG tube again. Pt is likely to pull out the tube again. He recommended to consider other way of feeing pt. TPN? Very difficult situation. I am hoping his mental status improves and then try to put PEG tube again. On 08/16/19,  his mental status improved when I saw patient in the morning.  He is orientated x3.  He answered all questions appropriately.  He even promised me that he will never pull out PEG tube if it is placed  again. Since pt's mental status improved. Re-consulted IR, [name] placed PEG tube 11/6 which is highly appreciated. X-ray of abdomen showed PEG tube position is OK.   1. started tube feeding 11/7. Has some nausea no vomiting, no abdominal pain or diarrhea.   -Monitor refeeding syndrome, check magnesium, BMP, phosphorus  Malnutrition of moderate degree: -started tube feeding 11/7 -consulted nutrition  Small right lung pulmonary emboli-  -restarted Eliquis 11/7 and d/c'ed levenox  Chronic iron deficiency anemia- hemoglobin is lower than baseline. Hgb 9.3, 8.8,10.6 -->10.2-->10.2-->11.1 -s/p IV iron x1 on 10/18 -Continue to monitor -Stable currently   DTs with history of EtOH abuse, and alcohol-induced mood disorder and wernicke's encephalopathy:  Patient weaned off Precedex drip and transferred out of ICU. Psychiatry consultedfor evaluation of psychosis-appreciate their input-request that they reevaluate secondary to behavioral nonadherence versus delirium. Psychiatry wrote for Haldol as needed on initial consultation -Continue thiamine, folate and multivitamin.   Elevated LFTs- likely due to alcohol use. AST/ALT improved. Right upper quadrant ultrasound showed hepatic steatosis -Monitor  Tobacco use -Nicotine patch ordered  Fall with lip laceration: Oral cavity lip laceration now 12 hours out from injury with small tooth fragment in depths of wound.  [name] from ENT was consulted. "I offered the patient the option of numbing the area and probing the depths of the wound to see if I might locate this small dental fragment, and possibly placing a couple of loose stiches to better align this flap of tissue but he refused". -appreciate [name] consultation. -started Augmentin 11/4 --> changed to unasyn -->Changed back to Augmentin  Left hand cellulitis: resolved. Felt to be probably due to bug bite. Patient seen by orthopedic physician . No evidence of deeper  infection that would require I&D at this time. Was treated with IV Unasyn recently,clinical monitoring. Appreciate their follow-up.   DVT prophylaxis: on Lovenos  Code Status: fall Family Communication: I called his sister by phone. Disposition Plan: pt need to go to SNF, when bed is available, patient can be discharged. Barriers for discharge:    Consultants:   ENT  Psychiatry  IR  Procedures:  EGD  PEG tube placement 11/6  Antimicrobials: Anti-infectives (From admission, onward)   Start     Dose/Rate Route Frequency Ordered Stop   08/18/19 1000  amoxicillin-clavulanate (AUGMENTIN) 875-125 MG per tablet 1 tablet     1 tablet Oral Every 12 hours 08/17/19 1309     08/17/19 2200  amoxicillin-clavulanate (AUGMENTIN) 875-125 MG per tablet 1 tablet  Status:  Discontinued     1 tablet Per Tube Every 12 hours 08/17/19 1132 08/17/19 1309   08/17/19 1315  amoxicillin-clavulanate (AUGMENTIN) 400-57 MG/5ML suspension 800 mg     800 mg of amoxicillin Oral Every 12 hours 08/17/19 1309 08/17/19 2153   08/15/19 1215  amoxicillin-clavulanate (AUGMENTIN) 875-125 MG per tablet 1 tablet  Status:  Discontinued     1 tablet Oral Every 12 hours 08/15/19 1212 08/17/19 1132   08/06/19 1415  ceFAZolin (ANCEF) IVPB 2g/100 mL premix     2 g 200 mL/hr over 30 Minutes Intravenous  Once 08/06/19 1403 08/06/19 1530   08/06/19 1406  ceFAZolin (ANCEF) 2-4 GM/100ML-% IVPB    Note to Pharmacy: Fransico Michael   : cabinet override      08/06/19 1406 08/07/19 0214   08/03/19 2000  fluconazole (DIFLUCAN) IVPB 200 mg  Status:  Discontinued     200 mg 100 mL/hr over 60 Minutes Intravenous Every 24 hours 08/03/19 1954 08/07/19 1115   07/31/19 0630  Ampicillin-Sulbactam (UNASYN) 3 g in sodium chloride 0.9 % 100 mL IVPB  Status:  Discontinued     3 g 200 mL/hr over 30 Minutes Intravenous Every 6 hours 07/31/19 0620 08/09/19 1011         Subjective:  pt has nausea and vomiting with nonbilious nonbloody  vomitus after starting tube feeding.  No abdominal pain or diarrhea.  No fever or chills.  Has somefright facial pain. Pt is alert, orientated x3.  Objective: Vitals:   08/20/19 0418 08/20/19 0831 08/20/19 1835 08/20/19 2001  BP: 116/83 114/86 104/82 101/83  Pulse: 75 100 (!) 101 97  Resp: 18 16 18 16   Temp: 98.3 F (36.8 C) 99 F (37.2 C) 98.7 F (37.1 C) 97.7 F (36.5 C)  TempSrc: Oral Oral Oral Oral  SpO2: 100% 95% 96% 95%  Weight:      Height:        Intake/Output Summary (Last 24 hours) at 08/20/2019 2210 Last data filed at 08/20/2019 1647 Gross per 24 hour  Intake 3 ml  Output 1000 ml  Net -997 ml   Filed Weights   08/13/19 0631 08/17/19 1126 08/19/19 0602  Weight: 71.7 kg 72.2 kg 83.5 kg    Examination: Physical Exam:  General: Not in acute distress.  Dry mucosal membrane HEENT: PERRL, EOMI, no scleral icterus, No JVD or bruit. Has laceration of the interior of the upper lip with a flap of tissue. Cardiac: S1/S2, RRR, No murmurs, gallops or rubs Pulm: Clear to auscultation bilaterally. No rales, wheezing, rhonchi or rubs. Abd: Soft, nondistended, nontender, no rebound pain, no organomegaly, BS present Ext: No edema. 2+DP/PT pulse bilaterally Musculoskeletal: No joint deformities, erythema, or stiffness, ROM full Skin: No rashes.  Neuro: currently oriented X3, cranial nerves II-XII grossly intact. Psych: Patient is not psychotic, no suicidal or hemocidal ideation.    Data Reviewed: I have personally reviewed following labs and imaging studies  CBC: Recent Labs  Lab 08/17/19 0627 08/18/19 0458 08/20/19 0405 08/20/19 1107 08/20/19 1724  WBC 4.4 4.2 4.6 7.9 7.5  HGB 9.7* 10.2* 9.9* 11.0* 11.1*  HCT 32.9* 33.9* 34.6* 35.5* 37.6*  MCV 83.7 84.1 87.2 81.8 85.8  PLT 352 340 319 356 469   Basic Metabolic Panel: Recent Labs  Lab 08/16/19 0448 08/17/19 0627 08/18/19 0458 08/19/19 0554 08/20/19 0405  NA 140 136 138 137 141  K 3.8 3.0* 3.6 3.4* 4.1  CL  111 107 107 106 109  CO2 15* 21* 23 23 26   GLUCOSE 74 89 95 124* 95  BUN <5* <5* <5* <5* 5*  CREATININE 0.60* 0.46* 0.47* 0.44* 0.50*  CALCIUM 8.6* 8.2* 8.4* 8.3* 8.3*  MG 2.0 1.6* 1.9 1.7 2.5*  PHOS 3.7 3.2 3.1 3.1 3.3   GFR: Estimated Creatinine Clearance: 99.5 mL/min (A) (by C-G formula based on SCr of 0.5 mg/dL (L)). Liver Function Tests: Recent Labs  Lab 08/16/19 0006  AST 21  ALT 17  ALKPHOS  92  BILITOT 1.3*  PROT 5.9*  ALBUMIN 2.8*   No results for input(s): LIPASE, AMYLASE in the last 168 hours. No results for input(s): AMMONIA in the last 168 hours. Coagulation Profile: Recent Labs  Lab 08/17/19 0627  INR 1.2   Cardiac Enzymes: No results for input(s): CKTOTAL, CKMB, CKMBINDEX, TROPONINI in the last 168 hours. BNP (last 3 results) No results for input(s): PROBNP in the last 8760 hours. HbA1C: No results for input(s): HGBA1C in the last 72 hours. CBG: No results for input(s): GLUCAP in the last 168 hours. Lipid Profile: No results for input(s): CHOL, HDL, LDLCALC, TRIG, CHOLHDL, LDLDIRECT in the last 72 hours. Thyroid Function Tests: No results for input(s): TSH, T4TOTAL, FREET4, T3FREE, THYROIDAB in the last 72 hours. Anemia Panel: No results for input(s): VITAMINB12, FOLATE, FERRITIN, TIBC, IRON, RETICCTPCT in the last 72 hours. Sepsis Labs: No results for input(s): PROCALCITON, LATICACIDVEN in the last 168 hours.  No results found for this or any previous visit (from the past 240 hour(s)).    Radiology Studies: No results found.   Scheduled Meds: . amoxicillin-clavulanate  1 tablet Oral Q12H  . apixaban  5 mg Per Tube BID  . feeding supplement (PRO-STAT SUGAR FREE 64)  30 mL Per Tube Daily  . folic acid  1 mg Per Tube Daily  . free water  150 mL Per Tube Q4H  . pantoprazole sodium  40 mg Per Tube BID  . sodium chloride flush  3 mL Intravenous Q12H  . thiamine  100 mg Per Tube Daily   Continuous Infusions: . sodium chloride 250 mL (08/19/19  1058)  . feeding supplement (OSMOLITE 1.5 CAL) 1,000 mL (08/20/19 1830)     LOS: 23 days    Time spent: Roberts, DO [facility] PAGER is on [place]  If 7PM-7AM, please contact night-coverage [email or web address] Password [email or web address] 08/20/2019, 10:10 PM

## 2019-08-20 NOTE — Progress Notes (Addendum)
Patient alert and oriented x4 today. Has not been impulsive with sitter at bedside. Will trial tele monitoring.   Tele-monitoring staff to call back until they get authorization from their supervisor even tho I had already spoken with them. Oncoming shift to call again to set up tele monitoring. Sitter currently at bedside.

## 2019-08-20 NOTE — Progress Notes (Signed)
[place] for Apixaban Indication: pulmonary embolus  Allergies  Allergen Reactions  . Sulfa Antibiotics Nausea Only    Patient Measurements: Height: 5\' 8"  (172.7 cm) Weight: 184 lb (83.5 kg) IBW/kg (Calculated) : 68.4   Vital Signs: Temp: 99 F (37.2 C) (11/09 0831) Temp Source: Oral (11/09 0831) BP: 114/86 (11/09 0831) Pulse Rate: 100 (11/09 0831)  Labs: Recent Labs    08/18/19 0458 08/19/19 0554 08/20/19 0405  HGB 10.2*  --  9.9*  HCT 33.9*  --  34.6*  PLT 340  --  319  CREATININE 0.47* 0.44* 0.50*    Estimated Creatinine Clearance: 99.5 mL/min (A) (by C-G formula based on SCr of 0.5 mg/dL (L)).   Assessment: 63 yo male temporarily on Lovenox for PE while NPO transitioning back to apixaban.  Patient started on apixaban for PE. Patient unable to take PO meds. Apixaban was switched to therapeutic Lovenox until patient able to tolerate PO meds.   Patient PEG tube replaced 11/6. Will now resume Eliquis therapy.  (Addendum - per IR/Nursing - only liquid meds 11/6, will dose enoxaparin 11/6 and resume Eliquis 11/7)  Goal of Therapy:  Monitor platelets by anticoagulation protocol: Yes   Plan:  Continue apixaban 5 mg BID Scr and CBC at least every three days per policy  Pharmacy will continue to follow.   Rocky Morel, PharmD, BCPS Clinical Pharmacist 08/20/2019 8:45 AM

## 2019-08-20 NOTE — Consult Note (Addendum)
[facility] SURGICAL CONSULTATION NOTE (initial) - cpt: [id number]   HISTORY OF PRESENT ILLNESS (HPI):  63 y.o. male who was admitted to [facility] on 10/17 after being diagnosed with PE. He has been on Eliquis. Complicated admission history to this point. In short, he began to notice rectal bleeding in the last 1-2 hospital days with subsequent drop in Hgb by 0.3 grams. He reports that he has noticed rectal bleeding about once a month for the past few years. This has become more constant in the last few days. He thought up until today it was related to hemorrhoids. No other associated symptoms. He reports a history of non-hodgkin's lymphoma in himself. No family history of colorectal cancers. He was evaluated by gastroenterology for this and was found to have a fungating lesion visible at the anus.   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   Surgery is consulted by gastroeneterology physician [name], MD in this context for evaluation and management of fungating lesion at the anus.   PAST MEDICAL HISTORY (PMH):  Past Medical History:  Diagnosis Date  . Alcoholism with psychosis with complication, with hallucinations ([place])   . ED (erectile dysfunction)   . Fatigue   . History of pneumonia   . Hypercholesteremia   . Hypertension   . Lymphoma ([place]) 2008   non hodgkins lymphoma  . Rectal bleeding   . 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 ESOPHAGOGASTRODUODENOSCOPY N/A 05/18/2019   Procedure: ESOPHAGOGASTRODUODENOSCOPY (EGD);  Surgeon: Lin Landsman, MD;  Location: [facility] ENDOSCOPY;  Service: Gastroenterology;  Laterality: N/A;  . ESOPHAGOGASTRODUODENOSCOPY (EGD) WITH PROPOFOL N/A 08/02/2019   Procedure:  ESOPHAGOGASTRODUODENOSCOPY (EGD) WITH PROPOFOL;  Surgeon: [name], MD;  Location: [facility] ENDOSCOPY;  Service: Endoscopy;  Laterality: N/A;  . [place]  . IR GASTROSTOMY TUBE MOD SED  08/06/2019  . IR GASTROSTOMY TUBE MOD SED  08/17/2019  . JOINT REPLACEMENT    . REPLACEMENT TOTAL KNEE BILATERAL  2009  . TONSILLECTOMY  2008   with neck dissection      MEDICATIONS:  Prior to Admission medications   Medication Sig Start Date End Date Taking? Authorizing Provider  Rivaroxaban (XARELTO) 15 MG TABS tablet Take 15 mg by mouth 2 (two) times daily with a meal.   Yes [provider]  rivaroxaban (XARELTO) 20 MG TABS tablet Take 20 mg by mouth daily. 07/23/19 07/22/20 Yes [provider]     ALLERGIES:  Allergies  Allergen Reactions  . Sulfa Antibiotics Nausea Only     SOCIAL HISTORY:  Social History   Socioeconomic History  . Marital status: Divorced    Spouse name: Not on file  . Number of children: Not on file  . Years of education: Not on file  . Highest education level: Not on file  Occupational History  . Not on file  Social Needs  . Financial resource strain: Not on file  . Food insecurity    Worry: Not on file    Inability: Not on file  . Transportation needs    Medical: Not on file    Non-medical: Not on file  Tobacco Use  . Smoking status: Never Smoker  . Smokeless tobacco: Never Used  Substance and Sexual Activity  . Alcohol use: Yes    Alcohol/week: 5.0 standard drinks    Types: 5 Glasses of wine per week    Frequency: Never  . Drug use: No  . Sexual activity: Not on file  Lifestyle  . Physical activity    Days per week: Not on file    Minutes per session: Not on file  . Stress: Not on file  Relationships  . Social Herbalist on phone: Not on file    Gets together: Not on file    Attends religious service: Not on file    Active member of club or organization: Not on file    Attends meetings of clubs or  organizations: Not on file    Relationship status: Not on file  . Intimate partner violence    Fear of current or ex partner: Not on file    Emotionally abused: Not on file    Physically abused: Not on file    Forced sexual activity: Not on file  Other Topics Concern  . Not on file  Social History Narrative  . Not on file     FAMILY HISTORY:  Family History  Problem Relation Age of Onset  . Cancer Mother   . Hypertension Father   . Cancer Paternal Grandmother       REVIEW OF SYSTEMS:  Review of Systems  Constitutional: Negative for chills, diaphoresis, fever, malaise/fatigue and weight loss.  HENT: Negative for congestion and sore throat.   Respiratory: Negative for cough and shortness of breath.   Cardiovascular: Negative for chest pain and palpitations.  Gastrointestinal: Positive for blood in stool. Negative for abdominal pain, constipation, diarrhea, nausea and vomiting.  Genitourinary: Negative for dysuria and urgency.  All other systems reviewed and are negative.   VITAL SIGNS:  Temp:  [98.3 F (36.8 C)-99 F (37.2 C)] 99 F (37.2 C) (11/09 0831) Pulse Rate:  [75-100] 100 (11/09 0831) Resp:  [16-18] 16 (11/09 0831) BP: (98-116)/(75-86) 114/86 (11/09 0831) SpO2:  [95 %-100 %] 95 % (11/09 0831)     Height: 5\' 8"  (172.7 cm) Weight: 83.5 kg BMI (Calculated): 27.98   INTAKE/OUTPUT:  11/08 0701 - 11/09 0700 In: 181.1 [IV Piggyback:181.1] Out: -   PHYSICAL EXAM:  Physical Exam Vitals signs and nursing note reviewed. Exam conducted with a chaperone present.  Constitutional:      General: He is not in acute distress.    Appearance: He is well-developed and normal weight. He is not ill-appearing.     Comments: Chaperone present Frequent hiccups  HENT:     Head: Normocephalic and atraumatic.  Eyes:     Extraocular Movements: Extraocular movements intact.     Pupils: Pupils are equal, round, and reactive to light.  Cardiovascular:     Rate and Rhythm: Normal  rate and regular rhythm.     Pulses: Normal pulses.     Heart sounds: Normal heart sounds. No murmur. No friction rub. No gallop.   Pulmonary:     Effort: Pulmonary effort is normal. No respiratory distress.     Breath sounds: Normal breath sounds. No wheezing or rhonchi.  Abdominal:     General: Abdomen is flat. There is no distension.     Tenderness: There is no abdominal tenderness. There is no guarding or rebound.     Hernia: No hernia is present.     Comments: G-tube in place  Genitourinary:   Musculoskeletal:     Right  lower leg: No edema.     Left lower leg: No edema.  Skin:    General: Skin is warm and dry.  Neurological:     General: No focal deficit present.     Mental Status: He is alert and oriented to person, place, and time. Mental status is at baseline.  Psychiatric:        Mood and Affect: Mood normal.        Behavior: Behavior normal.    Anal Mass (08/20/2019):       Labs:  CBC Latest Ref Rng & Units 08/20/2019 08/20/2019 08/18/2019  WBC 4.0 - 10.5 K/uL 7.9 4.6 4.2  Hemoglobin 13.0 - 17.0 g/dL 11.0(L) 9.9(L) 10.2(L)  Hematocrit 39.0 - 52.0 % 35.5(L) 34.6(L) 33.9(L)  Platelets 150 - 400 K/uL 356 319 340   CMP Latest Ref Rng & Units 08/20/2019 08/19/2019 08/18/2019  Glucose 70 - 99 mg/dL 95 124(H) 95  BUN 8 - 23 mg/dL 5(L) <5(L) <5(L)  Creatinine 0.61 - 1.24 mg/dL 0.50(L) 0.44(L) 0.47(L)  Sodium 135 - 145 mmol/L 141 137 138  Potassium 3.5 - 5.1 mmol/L 4.1 3.4(L) 3.6  Chloride 98 - 111 mmol/L 109 106 107  CO2 22 - 32 mmol/L 26 23 23   Calcium 8.9 - 10.3 mg/dL 8.3(L) 8.3(L) 8.4(L)  Total Protein 6.5 - 8.1 g/dL - - -  Total Bilirubin 0.3 - 1.2 mg/dL - - -  Alkaline Phos 38 - 126 U/L - - -  AST 15 - 41 U/L - - -  ALT 0 - 44 U/L - - -     Imaging studies:  No pertinent imaging studies   Assessment/Plan: (ICD-10's: A63.0) 63 y.o. male with large anal mass which appears most consistent with anogenital condylomata, for which he was seen in our clinic in  September of 2020.    - No indication for biopsy at this time.   - Again, he should be referred to a colorectal surgeon for evaluation of this. In September he preferred [facility].    - No emergent surgical intervention   - Further management per primary service   All of the above findings and recommendations were discussed with the patient , and all of patient's questions were answered to his expressed satisfaction.  Thank you for the opportunity to participate in this patient's care.  We will sign off.  -- Edison Simon, PA-C [facility] 08/20/2019, 1:18 PM   I saw and evaluated the patient.  I agree with the above documentation, exam, and plan, which I have edited where appropriate. Fredirick Maudlin  6:32 PM   [phone] M-F: 7am - 4pm

## 2019-08-20 NOTE — TOC Progression Note (Addendum)
Transition of Care (TOC) - Progression Note    Patient Details  Name: George Wade. MRN: [id number] Date of Birth: 1956/08/11  Transition of Care [facility]) CM/SW [place], LCSW Phone Number: 08/20/2019, 12:40 PM  Clinical Narrative: CSW spoke with patient regarding discharge plan. He stated he needs to go somewhere where he has to pay only a small amount of money. Patient has been residing in an RV behind someone's house but says that is not a good situation due to the woman starting to show signs of dementia. Otherwise he lives in his car. Patient gets an SSI check of a little over $1000 per month. He says he may be able to pay only $800 or so. 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 He is in his junior year. Patient applied for Medicaid 90-120 days ago per patient. CSW Advice worker to check this as well as status updates. Discussed concern with patient about him discharging back to the RV or his car with a new peg tube. CSW explained to patient that SNF may be his only option but he is not willing to sign over his SSI check. May have to involve Surveyor, quantity of social work dept.  2:04 pm: Patient's Medicaid application was submitted 9/4 and due date is 12/3. Tried calling DSS caseworker but unable to reach anyone. Emailed Development worker, community following his pending status. Sent out SNF referral now that we have this new information.  2:53 pm: Radio broadcast assistant of [place] [facility] supervisor requesting assistance with disposition.      Expected Discharge Plan and Services                                                 Social Determinants of Health (SDOH) Interventions    Readmission Risk Interventions No flowsheet data found.

## 2019-08-20 NOTE — Plan of Care (Signed)
  Problem: Education: Goal: Knowledge of General Education information will improve Description: Including pain rating scale, medication(s)/side effects and non-pharmacologic comfort measures Outcome: Progressing   Problem: Clinical Measurements: Goal: Respiratory complications will improve Outcome: Progressing   Problem: Activity: Goal: Risk for activity intolerance will decrease Outcome: Progressing   Problem: Nutrition: Goal: Adequate nutrition will be maintained Outcome: Progressing Note: Patient continues on tube feeding and tolerating well.

## 2019-08-20 NOTE — Consult Note (Signed)
Hematology/Oncology Consult note [facility] [phone] [phone]  Patient Care Team: Patient, [name] Per as PCP - General (General Practice)   Name of the patient: George Wade  [id number]  [birth year]    Reason for consult: h/o PE. Concern for rectal bleeding and anal mass   Requesting physician: Dr. 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  Patient was supposed to get a repeat CT scan in 3 months but did not follow-up .  More recently patient was admitted to the hospital in October 2020 for symptoms of cough and chest pain for 2 weeks there was significant motion artifact but there was concern for acute pulmonary embolism in the right lung.  Stable to decrease mediastinal lymph node size.  Severe hepatic steatosis.  No convincing lung opacity.  Patient was started on Eliquis at that time.  He has a history of chronic alcohol abuse.  He had symptoms of dysphagia and endoscopy showed esophageal stricture which was unable to be traversed despite dilatation.  Patient underwent PEG tube placement on 08/06/2019 this was pulled out and reinstituted on 10/26.  Patient was complaining of blood in his stool for many months and therefore GI was consulted.  He has never had a colonoscopy.  Hemoglobin during this admission has been more or less stable between 10-11.  On physical exam patient was noted to have a large anal lesion.  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  Per surgery patient would need referral to colorectal surgeon and this was made back in September  but patient did not follow-up.  He currently reports seeing bright red blood when he wipes himself in the toilet. He feels that his anal mass has grown considerably in the last 6-8 weeks  ECOG PS- 1  Pain scale- 0   Review of systems- Review of Systems  Constitutional: Positive for malaise/fatigue. Negative for chills, fever and weight loss.  HENT: Negative for congestion, ear discharge and nosebleeds.   Eyes: Negative for blurred vision.  Respiratory: Negative for cough, hemoptysis, sputum production, shortness of breath and wheezing.   Cardiovascular: Negative for chest pain, palpitations, orthopnea and claudication.  Gastrointestinal: Negative for abdominal pain, blood in stool, constipation, diarrhea, heartburn, melena, nausea and vomiting.  Genitourinary: Negative for dysuria, flank pain, frequency, hematuria and urgency.  Musculoskeletal: Negative for back pain, joint pain and myalgias.  Skin: Negative for rash.  Neurological: Negative for dizziness, tingling, focal weakness, seizures, weakness and headaches.  Endo/Heme/Allergies: Does not bruise/bleed easily.  Psychiatric/Behavioral: Negative for depression and suicidal ideas. The patient does not have insomnia.     Allergies  Allergen Reactions   Sulfa Antibiotics Nausea Only    Patient Active Problem List   Diagnosis Date Noted   Rectal bleeding 08/20/2019   PEG (percutaneous endoscopic gastrostomy) status ([place])    Aspiration into airway    Fall 08/15/2019   Elevated LFTs    Esophageal stricture    Problems with swallowing and mastication    Stricture and stenosis of esophagus    Malnutrition of moderate degree 07/31/2019   Nausea & vomiting  Pulmonary emboli ([facility]) 07/28/2019   Alcohol-induced mood disorder ([place]) 07/28/2019   Alcohol dependence with uncomplicated intoxication ([place])    Acute respiratory failure with hypoxia ([place]) 05/15/2019   Wernicke's encephalopathy 04/15/2019   GI bleed  03/31/2019   Alcohol withdrawal ([place]) 12/09/2017     Past Medical History:  Diagnosis Date   Alcoholism with psychosis with complication, with hallucinations [facility])    ED (erectile dysfunction)    Fatigue    History of pneumonia    Hypercholesteremia    Hypertension    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 ENDOSCOPY;  Service: Gastroenterology;  Laterality: N/A;   ESOPHAGOGASTRODUODENOSCOPY (EGD) WITH PROPOFOL N/A 08/02/2019   Procedure: ESOPHAGOGASTRODUODENOSCOPY (EGD) WITH PROPOFOL;  Surgeon: [name], MD;  Location: [facility] ENDOSCOPY;  Service: Endoscopy;  Laterality: N/A;   HERNIA REPAIR  1958   IR GASTROSTOMY TUBE MOD SED  08/06/2019   IR GASTROSTOMY TUBE MOD SED  08/17/2019   JOINT REPLACEMENT     REPLACEMENT TOTAL KNEE BILATERAL  2009   TONSILLECTOMY  2008   with neck dissection     Social History   Socioeconomic History   Marital status: Divorced    Spouse name: Not on file   Number of children: Not on file   Years of education: Not on file   Highest education level: Not on file  Occupational History   Not on file  Social Needs   Financial resource strain: Not on file   Food insecurity    Worry: Not on file    Inability: Not on file   Transportation needs    Medical: Not on file    Non-medical: Not on file  Tobacco Use   Smoking status: Never Smoker   Smokeless tobacco: Never Used  Substance and Sexual Activity   Alcohol use: Yes    Alcohol/week: 5.0 standard drinks    Types: 5 Glasses of wine per week    Frequency: Never   Drug use: No   Sexual activity: Not on file  Lifestyle   Physical activity    Days per week: Not on file     Minutes per session: Not on file   Stress: Not on file  Relationships   Social connections    Talks on phone: Not on file    Gets together: Not on file    Attends religious service: Not on file    Active member of club or organization: Not on file    Attends meetings of clubs or organizations: Not on file    Relationship status: Not on file   Intimate partner violence    Fear of current or ex partner: Not on file    Emotionally abused: Not on file    Physically abused: Not on file    Forced sexual activity: Not on file  Other Topics Concern   Not on file  Social History Narrative   Not on file     Family History  Problem Relation Age of Onset   Cancer Mother    Hypertension Father    Cancer Paternal Grandmother      Current Facility-Administered Medications:    0.9 %  sodium chloride infusion, 250 mL, Intravenous, PRN,  Demetrios Loll, MD, Last Rate: 10 mL/hr at 08/19/19 1058, 250 mL at 08/19/19 1058   acetaminophen (TYLENOL) tablet 650 mg, 650 mg, Oral, Q6H PRN **OR** acetaminophen (TYLENOL) suppository 650 mg, 650 mg, Rectal, Q6H PRN, Demetrios Loll, MD   albuterol (PROVENTIL) (2.5 MG/3ML) 0.083% nebulizer solution 2.5 mg, 2.5 mg, Nebulization, Q2H PRN, Demetrios Loll, MD, 2.5 mg at 08/04/19 1012   [COMPLETED] amoxicillin-clavulanate (AUGMENTIN) 400-57 MG/5ML suspension 800 mg, 800 mg of amoxicillin, Oral, Q12H, 800 mg at 08/17/19 2153 **FOLLOWED BY** amoxicillin-clavulanate (AUGMENTIN) 875-125 MG per tablet 1 tablet, 1 tablet, Oral, Q12H, Shanlever, Pierce Crane, RPH, 1 tablet at 08/20/19 0981   apixaban (ELIQUIS) tablet 5 mg, 5 mg, Per Tube, BID, Shanlever, Pierce Crane, RPH, 5 mg at 08/20/19 0843   bisacodyl (DULCOLAX) EC tablet 5 mg, 5 mg, Oral, Daily PRN, Demetrios Loll, MD   feeding supplement (OSMOLITE 1.5 CAL) liquid 1,000 mL, 1,000 mL, Per Tube, Continuous, Ivor Costa, MD, Last Rate: 50 mL/hr at 08/19/19 1848, 1,000 mL at 08/19/19 1848   feeding supplement (PRO-STAT SUGAR  FREE 64) liquid 30 mL, 30 mL, Per Tube, Daily, Flora Lipps, MD, 30 mL at 19/14/78 2956   folic acid (FOLVITE) tablet 1 mg, 1 mg, Per Tube, Daily, Spongberg, Audie Pinto, MD, 1 mg at 08/20/19 0843   free water 150 mL, 150 mL, Per Tube, Q4H, [place], Bing Neighbors, NP, 150 mL at 08/20/19 1243   guaiFENesin-dextromethorphan (ROBITUSSIN DM) 100-10 MG/5ML syrup 5 mL, 5 mL, Oral, Q4H PRN, Lance Coon, MD   haloperidol lactate (HALDOL) injection 2 mg, 2 mg, Intravenous, Q6H PRN, Marcell Anger, MD, 2 mg at 08/14/19 1714   HYDROcodone-acetaminophen (NORCO/VICODIN) 5-325 MG per tablet 1-2 tablet, 1-2 tablet, Oral, Q4H PRN, Demetrios Loll, MD, 2 tablet at 08/10/19 1813   HYDROcodone-acetaminophen (NORCO/VICODIN) 5-325 MG per tablet 1-2 tablet, 1-2 tablet, Oral, Q4H PRN, Arne Cleveland, MD, 2 tablet at 08/20/19 0904   HYDROmorphone (DILAUDID) injection 1 mg, 1 mg, Intravenous, Q2H PRN, Arne Cleveland, MD   iodixanol (VISIPAQUE) 320 MG/ML injection 50 mL, 50 mL, Other, Once PRN, Arne Cleveland, MD   metoprolol tartrate (LOPRESSOR) injection 5 mg, 5 mg, Intravenous, Q6H PRN, Lance Coon, MD, 5 mg at 07/30/19 2314   morphine 2 MG/ML injection 1 mg, 1 mg, Intravenous, Q4H PRN, Ivor Costa, MD, 1 mg at 08/18/19 1807   ondansetron (ZOFRAN) tablet 4 mg, 4 mg, Oral, Q6H PRN **OR** ondansetron (ZOFRAN) injection 4 mg, 4 mg, Intravenous, Q6H PRN, Demetrios Loll, MD, 4 mg at 08/20/19 0836   ondansetron (ZOFRAN) injection 4 mg, 4 mg, Intravenous, Q4H PRN, Arne Cleveland, MD   pantoprazole sodium (PROTONIX) 40 mg/20 mL oral suspension 40 mg, 40 mg, Per Tube, BID, Ivor Costa, MD   polyvinyl alcohol (LIQUIFILM TEARS) 1.4 % ophthalmic solution 1 drop, 1 drop, Both Eyes, PRN, Ivor Costa, MD, 1 drop at 08/19/19 2153   promethazine (PHENERGAN) injection 25 mg, 25 mg, Intravenous, Q6H PRN, Benjie Karvonen, Sital, MD, 25 mg at 08/08/19 0934   senna-docusate (Senokot-S) tablet 1 tablet, 1 tablet, Oral, QHS PRN,  Demetrios Loll, MD   sodium chloride flush (NS) 0.9 % injection 3 mL, 3 mL, Intravenous, Q12H, Demetrios Loll, MD, 3 mL at 08/20/19 0843   sodium chloride flush (NS) 0.9 % injection 3 mL, 3 mL, Intravenous, PRN, Demetrios Loll, MD   thiamine (VITAMIN B-1) tablet 100 mg, 100 mg, Per Tube, Daily, Spongberg, Audie Pinto, MD, 100 mg at 08/20/19 2130   Physical exam:  Vitals:  08/19/19 1314 08/19/19 2150 08/20/19 0418 08/20/19 0831  BP:  98/75 116/83 114/86  Pulse:   75 100  Resp:  18 18 16   Temp: 98.7 F (37.1 C) 98.4 F (36.9 C) 98.3 F (36.8 C) 99 F (37.2 C)  TempSrc: Oral Oral Oral Oral  SpO2:  98% 100% 95%  Weight:      Height:       Physical Exam Constitutional:      Comments: appears fatigued  HENT:     Head: Normocephalic and atraumatic.  Eyes:     Pupils: Pupils are equal, round, and reactive to light.  Neck:     Musculoskeletal: Normal range of motion.  Cardiovascular:     Rate and Rhythm: Normal rate and regular rhythm.     Heart sounds: Normal heart sounds.  Pulmonary:     Effort: Pulmonary effort is normal.     Breath sounds: Normal breath sounds.  Abdominal:     General: Bowel sounds are normal.     Palpations: Abdomen is soft.     Comments: Peg tube in place  Skin:    General: Skin is warm and dry.  Neurological:     Mental Status: He is alert and oriented to person, place, and time.        CMP Latest Ref Rng & Units 08/20/2019  Glucose 70 - 99 mg/dL 95  BUN 8 - 23 mg/dL 5(L)  Creatinine 0.61 - 1.24 mg/dL 0.50(L)  Sodium 135 - 145 mmol/L 141  Potassium 3.5 - 5.1 mmol/L 4.1  Chloride 98 - 111 mmol/L 109  CO2 22 - 32 mmol/L 26  Calcium 8.9 - 10.3 mg/dL 8.3(L)  Total Protein 6.5 - 8.1 g/dL -  Total Bilirubin 0.3 - 1.2 mg/dL -  Alkaline Phos 38 - 126 U/L -  AST 15 - 41 U/L -  ALT 0 - 44 U/L -   CBC Latest Ref Rng & Units 08/20/2019  WBC 4.0 - 10.5 K/uL 7.9  Hemoglobin 13.0 - 17.0 g/dL 11.0(L)  Hematocrit 39.0 - 52.0 % 35.5(L)  Platelets 150 - 400  K/uL 356    @IMAGES @  Dg Chest 2 View  Result Date: 07/28/2019 CLINICAL DATA:  Cough and chest pain for 2 weeks. EXAM: CHEST - 2 VIEW COMPARISON:  July 17, 2019 FINDINGS: The heart size and mediastinal contours are within normal limits. Both lungs are clear. The visualized skeletal structures are unremarkable. IMPRESSION: No active cardiopulmonary disease. Electronically Signed   By: Dorise Bullion III M.D   On: 07/28/2019 12:18   Dg Abd 1 View  Result Date: 08/18/2019 CLINICAL DATA:  PEG tube placement, placed 08/17/2019 and 08/06/2019. Per ordering physician no contrast administered. EXAM: ABDOMEN - 1 VIEW COMPARISON:  Abdominal radiograph 08/08/2019 FINDINGS: PEG tube is again visualized overlying the left upper quadrant. Patency cannot be assessed in the absence of contrast. There are no dilated loops of bowel. No supine evidence for free air. Oral contrast is seen throughout multiple loops of bowel to the level of the rectum. Visualized lung bases are clear. No acute finding in the visualized skeleton. IMPRESSION: 1. PEG tube overlies the left upper quadrant. Patency cannot be assessed in the absence of contrast. 2. Nonobstructive bowel gas pattern. Oral contrast is noted throughout multiple loops of bowel. Electronically Signed   By: Audie Pinto M.D.   On: 08/18/2019 14:27   Dg Abd 1 View  Result Date: 08/08/2019 CLINICAL DATA:  Status post PEG placement. EXAM: ABDOMEN - 1  VIEW COMPARISON:  Single-view of the abdomen 07/29/2019. FINDINGS: Feeding tube is identified with its tip projecting over the left aspect of the L2 vertebral body. Contrast in the colon is noted. IMPRESSION: New feeding tube in place. Electronically Signed   By: Inge Rise M.D.   On: 08/08/2019 13:55   Dg Abd 1 View  Result Date: 07/29/2019 CLINICAL DATA:  Nausea and vomiting EXAM: ABDOMEN - 1 VIEW COMPARISON:  April 01, 2019 FINDINGS: There is a paucity of bowel gas limiting evaluation. Within this  limitation, there is no evidence of bowel obstruction. No renal or ureteral stones noted. No free air, portal venous gas, or pneumatosis. IMPRESSION: No abnormalities identified. Electronically Signed   By: Dorise Bullion III M.D   On: 07/29/2019 15:50   Ct Head Wo Contrast  Result Date: 08/14/2019 CLINICAL DATA:  Initial evaluation for acute trauma, fall. EXAM: CT HEAD WITHOUT CONTRAST TECHNIQUE: Contiguous axial images were obtained from the base of the skull through the vertex without intravenous contrast. COMPARISON:  Prior head CT from 07/17/2019. FINDINGS: Brain: Generalized age-related cerebral atrophy with mild chronic small vessel ischemic disease. No acute intracranial hemorrhage. No acute large vessel territory infarct. No mass lesion, midline shift or mass effect no hydrocephalus. No extra-axial fluid collection. Vascular: No hyperdense vessel. Scattered vascular calcifications noted within the carotid siphons. Skull: Scalp soft tissues demonstrate no acute finding. Calvarium intact. Sinuses/Orbits: Globes and orbital soft tissues within normal limits. Mild scattered mucosal thickening noted within the ethmoidal air cells and maxillary sinuses. No mastoid effusion. Other: None. IMPRESSION: 1. No acute intracranial abnormality. 2. Age-related cerebral atrophy with mild chronic small vessel ischemic disease, stable. Electronically Signed   By: Jeannine Boga M.D.   On: 08/14/2019 22:41   Ct Angio Chest Pe W And/or Wo Contrast  Addendum Date: 07/28/2019   ADDENDUM REPORT: 07/28/2019 18:02 ADDENDUM: Study discussed by telephone with Dr. Archie Balboa in the ED on 07/28/2019 at 1755 hours. Electronically Signed   By: Genevie Ann M.D.   On: 07/28/2019 18:02   Result Date: 07/28/2019 CLINICAL DATA:  63 year old male with cough and chest pain for 2 weeks. EXAM: CT ANGIOGRAPHY CHEST WITH CONTRAST TECHNIQUE: Multidetector CT imaging of the chest was performed using the standard protocol during bolus  administration of intravenous contrast. Multiplanar CT image reconstructions and MIPs were obtained to evaluate the vascular anatomy. CONTRAST:  58mL OMNIPAQUE IOHEXOL 350 MG/ML SOLN COMPARISON:  Chest radiographs earlier today. CT Chest, Abdomen, and Pelvis 05/15/2019 FINDINGS: Cardiovascular: Adequate contrast bolus timing in the pulmonary arterial tree. Intermittent respiratory motion, severe just below the hila and affecting much of the lower lobe pulmonary artery detail. However, there is a suspicious appearance of the right lower lobe branches on series 5, image 149, which continues into image 161. And also nonocclusive thrombus visible in the right upper lobe branch on image 105 which is relatively motion free. Calcified coronary artery atherosclerosis. Negative visible aorta aside from mild atherosclerosis. Stable cardiac size, no cardiomegaly. No pericardial effusion. Mediastinum/Nodes: Mediastinal lymph node size appears stable to slightly decreased from August. Persistent circumferential thickening of the esophagus, without progression. Lungs/Pleura: Intermittent motion artifact. Stable lung volumes. No convincing abnormal pulmonary opacity today. And no pleural effusion is evident. Upper Abdomen: Severe hepatic steatosis. Thickened appearance of the gastroesophageal junction (see esophagus findings above the). Otherwise negative visible upper abdomen. Musculoskeletal: Chronic left rib fractures. Chronic right posterior 10th rib fracture. Thoracic vertebrae appear stable. Motion artifact at the sternum. No acute or suspicious osseous  lesion identified. Review of the MIP images confirms the above findings. IMPRESSION: 1. Motion degraded study but positive for acute pulmonary emboli in the right lung. No associated pleural effusion or abnormal pulmonary opacity. 2. Persistent abnormal thickening of the esophagus, stable since August. This is nonspecific but esophagitis seems more likely than esophageal  neoplasm now. 3. Stable to slightly decreased mediastinal lymph node size since August. 4. Calcified coronary artery atherosclerosis. 5. Severe hepatic steatosis. Electronically Signed: By: Genevie Ann M.D. On: 07/28/2019 16:13   Ir Gastrostomy Tube Mod Sed  Result Date: 08/17/2019 CLINICAL DATA:  Esophageal stricture, needs enteral feeding support. Previously placed gastrostomy catheter was inadvertently removed. EXAM: PERC PLACEMENT GASTROSTOMY FLUOROSCOPY TIME:  3.8 minutes; 77.9 mGy TECHNIQUE: The procedure, risks, benefits, and alternatives were explained to the patient. Questions regarding the procedure were encouraged and answered. The patient understands and consents to the procedure. As antibiotic prophylaxis, cefazolin 1 g was ordered pre-procedure and administered intravenously within one hour of incision. Under intermittent fluoroscopy, a 5 French angiographic catheter was placed as orogastric tube. The upper abdomen was prepped with chlorhexidine, draped in usual sterile fashion, and infiltrated locally with 1% lidocaine. Intravenous Fentanyl 114mcg and Versed 2mg  were administered as conscious sedation during continuous monitoring of the patient's level of consciousness and physiological / cardiorespiratory status by the radiology RN, with a total moderate sedation time of 18 minutes. 0.5 mg glucagon given IV to facilitate gastric distention.Stomach was insufflated using air through the orogastric tube. Attempts were made to pass an angled 5 French angiographic catheter through the previous tract under fluoroscopy using small amount of contrast with these were ultimately unsuccessful. Under fluoroscopic guidance between the 2 previously placed gastropexy fasteners, an 18 gauge entry needle was advanced percutaneously into the gastric lumen under fluoroscopy. Gas could be aspirated and a small contrast injection confirmed intraluminal spread. Guidewire advanced easily, within the gastric lumen on  fluoroscopy. Needle was exchanged over a guidewire for serial vascular dilators which allowed placement of a 18 French peel-away sheath, through which a 26 French balloon retention single-lumen gastrostomy catheter was advanced. The peel-away sheath was removed. The retention balloon was inflated with 7 mL sterile saline. Contrast injection confirms good positioning within the lumen of the stomach. The external bumper was applied and the catheter was flushed and capped. COMPLICATIONS: COMPLICATIONS none IMPRESSION: 1. Technically successful 28 French balloon-retention gastrostomy placement under fluoroscopy. Electronically Signed   By: Lucrezia Europe M.D.   On: 08/17/2019 12:59   Ir Gastrostomy Tube Mod Sed  Result Date: 08/06/2019 CLINICAL DATA:  Esophageal stricture and need for gastrostomy tube for nutrition. EXAM: PERCUTANEOUS GASTROSTOMY TUBE PLACEMENT ANESTHESIA/SEDATION: 3.0 mg IV Versed; 100 mcg IV Fentanyl. Total Moderate Sedation Time 30 minutes. The patient's level of consciousness and physiologic status were continuously monitored during the procedure by Radiology nursing. CONTRAST:  6mL VISIPAQUE IODIXANOL 320 MG/ML IV SOLN MEDICATIONS: 2 g IV Ancef. IV antibiotic was administered in an appropriate time interval prior to needle puncture of the skin. FLUOROSCOPY TIME:  5 minutes and 18 seconds.  100.5 mGy. PROCEDURE: The procedure, risks, benefits, and alternatives were explained to the patient. Questions regarding the procedure were encouraged and answered. The patient understands and consents to the procedure. A 5-French catheter was then advanced through the patient's mouth under fluoroscopy into the esophagus and to the level of the stomach. This catheter was used to insufflate the stomach with air under fluoroscopy. The abdominal wall was prepped with chlorhexidine in a sterile fashion,  and a sterile drape was applied covering the operative field. A sterile gown and sterile gloves were used for  the procedure. Local anesthesia was provided with 1% Lidocaine. A skin incision was made in the upper abdominal wall. Under fluoroscopy, an 18 gauge trocar needle was advanced into the stomach. Contrast injection was performed to confirm intraluminal position of the needle tip. A single T tack was then deployed in the lumen of the stomach. This was brought up to tension at the skin surface. A second T tack was then deployed in the lumen of the stomach utilizing similar technique approximately 3 cm from the first T tack. A 19 gauge needle was used to puncture the stomach under fluoroscopy between the 2 T tacks. Intraluminal positioning was confirmed by contrast injection. A guidewire was then advanced through the needle into the gastric lumen and the needle removed. The percutaneous tract was dilated over the wire. A peel-away sheath was placed. A 16 French balloon retention gastrostomy tube was then advanced through the peel-away sheath into the gastric lumen. The peel-away sheath was removed. The retention balloon of the gastrostomy tube was inflated with 6 mL of saline. The catheter was injected with contrast material to confirm position and a fluoroscopic spot image saved. The tube was then flushed with saline. A dressing was applied over the gastrostomy exit site. COMPLICATIONS: None. FINDINGS: The stomach distended well with air allowing safe placement of the gastrostomy tube. After placement, the tip of the gastrostomy tube lies in the body of the stomach. IMPRESSION: Percutaneous gastrostomy with placement of a 26 French balloon retention tube in the body of the stomach. This tube can be used for percutaneous feeds beginning in 24 hours after placement. Electronically Signed   By: Aletta Edouard M.D.   On: 08/06/2019 17:26   Dg Chest Port 1 View  Result Date: 08/04/2019 CLINICAL DATA:  Pulmonary opacities EXAM: PORTABLE CHEST 1 VIEW COMPARISON:  07/31/2019 chest radiograph. FINDINGS: Low lung volumes.  Stable cardiomediastinal silhouette with normal heart size. No pneumothorax. No pleural effusion. Patchy right greater than left parahilar and left lung base opacities appear slightly worsened. IMPRESSION: 1. Low lung volumes. 2. Patchy right greater than left parahilar and left lung base opacities appear slightly worsened, compatible with pneumonia, aspiration and/or atelectasis. Chest radiograph follow-up advised. Electronically Signed   By: Ilona Sorrel M.D.   On: 08/04/2019 10:45   Dg Chest Port 1 View  Result Date: 07/31/2019 CLINICAL DATA:  Recent aspiration EXAM: PORTABLE CHEST 1 VIEW COMPARISON:  07/30/2019 FINDINGS: Cardiac shadow is stable. Aortic calcifications are again seen. Increased basilar density is noted bilaterally consistent with the given clinical history. No bony abnormality is seen. IMPRESSION: Bibasilar density consistent with the given clinical history of aspiration. Electronically Signed   By: Inez Catalina M.D.   On: 07/31/2019 13:21   Dg Chest Port 1 View  Result Date: 07/30/2019 CLINICAL DATA:  Cough EXAM: PORTABLE CHEST 1 VIEW COMPARISON:  07/28/2019 FINDINGS: Mild cardiomegaly. Both lungs are clear. The visualized skeletal structures are unremarkable. IMPRESSION: Mild cardiomegaly without acute abnormality of the lungs in AP portable projection. Electronically Signed   By: Eddie Candle M.D.   On: 07/30/2019 21:50   Ct Maxillofacial Wo Contrast  Result Date: 08/14/2019 CLINICAL DATA:  Darrick Grinder evaluation for acute trauma, fall. EXAM: CT MAXILLOFACIAL WITHOUT CONTRAST TECHNIQUE: Multidetector CT imaging of the maxillofacial structures was performed. Multiplanar CT image reconstructions were also generated. COMPARISON:  None. FINDINGS: Osseous: Zygomatic arches intact. No acute  maxillary fracture. Pterygoid plates intact. Nasal bones intact without fracture. Nasal septum intact. No acute mandibular fracture. Mandibular condyles normally situated. Dentition grossly intact.  Orbits: Globes and orbital soft tissues within normal limits. Bony orbits intact. Sinuses: Mild mucoperiosteal thickening present within the ethmoidal air cells and right greater than left maxillary sinuses. Paranasal sinuses are otherwise clear. No hemosinus. Mastoid air cells and middle ear cavities are well pneumatized and free of fluid. Soft tissues: Soft tissue laceration present just to the left of midline at the upper lip. Superimposed radiopaque density could reflect debris/foreign body (series 3, image 59). Again, adjacent dentition grossly intact. No other visible soft tissue injury about the face. Limited intracranial: Unremarkable. IMPRESSION: 1. Soft tissue laceration just to the left of midline at the upper lip. Superimposed radiopaque density could reflect debris/foreign body. Adjacent dentition grossly intact. 2. No other acute maxillofacial injury identified.  No fracture. Electronically Signed   By: Jeannine Boga M.D.   On: 08/14/2019 22:55    Assessment and plan- Patient is a 63 y.o. male with history of chronic alcohol abuse admitted for chest pain.  Oncology consulted for his history of recent PE in August 2020 and concerns of rectal bleeding and anal mass  1.  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  He has not had a biopsy of this mass and patient was referred to colorectal surgeon at [facility] in September but did not follow-up with them. I will follow-up with him as an outpatient but he will need to be seen by colorectal surgery for further management and to potentially rule out anal carcinoma   2.  History of PE: At this time I would recommend the patient should continue his anticoagulation since his H&H is stable. Risks of untreated PE outweigh hemodynamically insignificant rectal bleeding at this time. If there is significant drop in his H/H, I will refer him to vascular surgery  and consider holding his anticoagulation as an outpatient    Visit Diagnosis 1. Pulmonary embolism without acute cor pulmonale, unspecified chronicity, unspecified pulmonary embolism type ([place])   2. Nausea & vomiting   3. Cough   4. Aspiration into airway   5. Elevated LFTs   6. Esophageal stricture   7. Pulmonary infiltrates   8. Abdominal distention   9. PEG (percutaneous endoscopic gastrostomy) status ([place])     Dr. Randa Evens, MD, MPH [facility] at [facility] [phone] 08/20/2019  8:26 PM

## 2019-08-20 NOTE — Consult Note (Signed)
George Wade , MD [address] [place], [place], [place], [address] [address], [place], [place], [place], [address] Phone: [phone]  Fax: [phone]  Consultation  Referring Provider:     No ref. provider found Primary Care Physician:  Patient, [name] Per Primary Gastroenterologist: None    Reason for Consultation:     Rectal bleeding   Date of Admission:  07/28/2019 Date of Consultation:  08/20/2019         HPI:   George Wade. is a 63 y.o. male has a history of right-sided pulmonary embolism on Eliquis.  History of daily alcohol use.  No recent evidence of esophageal varices on endoscopy in August 2020.  We were consulted on 10/23 for a feeding tube.  He has been esophageal stricture and the scope was unable to be passed despite dilation.  He underwent a PEG tube placement via interventional radiology on 08/06/2019 that the patient pulled out,placed back by IR on 08/17/2019.  2020.  08/06/2019.  The patient pulled the PEG tube out.  Reinflated 08/06/2019.  I have been consulted for rectal bleeding and a drop in Hb by 0.3 grams.  He is presently on Plavix for pulmonary embolism.  He states that he has been noticing blood in the stools for many months.  He says that his stools have been black in color for many months.  Last 2 days he saw a bit of blood a few tablespoons with bowel movements and some staining the chair he was sitting on.  He has never had a colonoscopy.  Denies any abdominal pain. Past Medical History:  Diagnosis Date  . Alcoholism with psychosis with complication, with hallucinations ([place])   . ED (erectile dysfunction)   . Fatigue   . History of pneumonia   . Hypercholesteremia   . Hypertension   . Lymphoma ([place]) 2008   non hodgkins lymphoma  . Rectal bleeding   . 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 ESOPHAGOGASTRODUODENOSCOPY N/A 05/18/2019   Procedure:  ESOPHAGOGASTRODUODENOSCOPY (EGD);  Surgeon: Lin Landsman, MD;  Location: [facility] ENDOSCOPY;  Service: Gastroenterology;  Laterality: N/A;  . ESOPHAGOGASTRODUODENOSCOPY (EGD) WITH PROPOFOL N/A 08/02/2019   Procedure: ESOPHAGOGASTRODUODENOSCOPY (EGD) WITH PROPOFOL;  Surgeon: [name], MD;  Location: [facility] ENDOSCOPY;  Service: Endoscopy;  Laterality: N/A;  . [place]  . IR GASTROSTOMY TUBE MOD SED  08/06/2019  . IR GASTROSTOMY TUBE MOD SED  08/17/2019  . JOINT REPLACEMENT    . REPLACEMENT TOTAL KNEE BILATERAL  2009  . TONSILLECTOMY  2008   with neck dissection     Prior to Admission medications   Medication Sig Start Date End Date Taking? Authorizing Provider  Rivaroxaban (XARELTO) 15 MG TABS tablet Take 15 mg by mouth 2 (two) times daily with a meal.   Yes [provider]  rivaroxaban (XARELTO) 20 MG TABS tablet Take 20 mg by mouth daily. 07/23/19 07/22/20 Yes [provider]    Family History  Problem Relation Age of Onset  . Cancer Mother   . Hypertension Father   . Cancer Paternal Grandmother      Social History   Tobacco Use  . Smoking status: Never Smoker  . Smokeless tobacco: Never Used  Substance Use Topics  . Alcohol use: Yes    Alcohol/week: 5.0 standard drinks    Types: 5 Glasses of wine per week  Frequency: Never  . Drug use: No    Allergies as of 07/28/2019 - Review Complete 07/28/2019  Allergen Reaction Noted  . Sulfa antibiotics Nausea Only 08/30/2018    Review of Systems:    All systems reviewed and negative except where noted in HPI.   Physical Exam:  Vital signs in last 24 hours: Temp:  [98.3 F (36.8 C)-99 F (37.2 C)] 99 F (37.2 C) (11/09 0831) Pulse Rate:  [75-100] 100 (11/09 0831) Resp:  [16-18] 16 (11/09 0831) BP: (98-116)/(75-86) 114/86 (11/09 0831) SpO2:  [95 %-100 %] 95 % (11/09 0831) Last BM Date: 08/16/19 General:   Pleasant, cooperative in NAD Head:  Normocephalic and atraumatic. Eyes:   No  icterus.   Conjunctiva pink. PERRLA. Ears:  Normal auditory acuity. Neck:  Supple; no masses or thyroidomegaly Lungs: Respirations even and unlabored. Lungs clear to auscultation bilaterally.   No wheezes, crackles, or rhonchi.  Heart:  Regular rate and rhythm;  Without murmur, clicks, rubs or gallops Abdomen:  Soft, nondistended, nontender. Normal bowel sounds. No appreciable masses or hepatomegaly.  No rebound or guarding.  G-tube placed and being fed presently.  External examination of the anus shows a large, fungating lesion with areas of friability closer to the anus I did not attempt to pass my finger at the anus as it appeared to have a degree of stenosis.  It appeared erythematous. Neurologic:  Alert and oriented x3;  grossly normal neurologically. Skin:  Intact without significant lesions or rashes. Cervical Nodes:  No significant cervical adenopathy. Psych:  Alert and cooperative. Normal affect.  LAB RESULTS: Recent Labs    08/18/19 0458 08/20/19 0405  WBC 4.2 4.6  HGB 10.2* 9.9*  HCT 33.9* 34.6*  PLT 340 319   BMET Recent Labs    08/18/19 0458 08/19/19 0554 08/20/19 0405  NA 138 137 141  K 3.6 3.4* 4.1  CL 107 106 109  CO2 23 23 26   GLUCOSE 95 124* 95  BUN <5* <5* 5*  CREATININE 0.47* 0.44* 0.50*  CALCIUM 8.4* 8.3* 8.3*   LFT No results for input(s): PROT, ALBUMIN, AST, ALT, ALKPHOS, BILITOT, BILIDIR, IBILI in the last 72 hours. PT/INR No results for input(s): LABPROT, INR in the last 72 hours.  STUDIES: Dg Abd 1 View  Result Date: 08/18/2019 CLINICAL DATA:  PEG tube placement, placed 08/17/2019 and 08/06/2019. Per ordering physician no contrast administered. EXAM: ABDOMEN - 1 VIEW COMPARISON:  Abdominal radiograph 08/08/2019 FINDINGS: PEG tube is again visualized overlying the left upper quadrant. Patency cannot be assessed in the absence of contrast. There are no dilated loops of bowel. No supine evidence for free air. Oral contrast is seen throughout multiple  loops of bowel to the level of the rectum. Visualized lung bases are clear. No acute finding in the visualized skeleton. IMPRESSION: 1. PEG tube overlies the left upper quadrant. Patency cannot be assessed in the absence of contrast. 2. Nonobstructive bowel gas pattern. Oral contrast is noted throughout multiple loops of bowel. Electronically Signed   By: Audie Pinto M.D.   On: 08/18/2019 14:27      Impression / Plan:   Koda Routon. is a 63 y.o. y/o male admitted with an acute pulmonary embolism on Eliquis.  He has a G-tube which has been placed by interventional radiology for dysphagia.  I have been consulted for a 0.3 g drop in hemoglobin and a history of rectal bleeding.  Going back in his history it appears to be a longstanding  issue and I am unsure if anyone is performed a prior rectal exam on him.  When I performed a perianal exam on him today he has a large diffuse fungating lesion originating from the anus.  The mucosa closer to the anus appears to be erythematous and there is probably degree of stenosis of his anus.  It is very likely that the bleeding seen as being from this lesion.  He denies any prior evaluation of the same.  I did not attempt to insert my finger as he is on anticoagulation and has bled on and off the last few days.  Plan 1.  Monitor CBC and transfuse as needed.  If there is significant amount of bleeding then hold Eliquis and may need a IVC filter.  At that point of time I would suggest to repeat perianal examination to see if the lesion is bleeding or blood is coming from further within. 2.  I would suggest to have surgery see him as he would warrant a biopsy.  Is probably going to be relatively easy to obtain the biopsy as a is externally visible.  Based on the result of the biopsy further evaluation can be determined.  I will discuss with oncology to determine if this is something they need to see as an inpatient or would like to see as an outpatient.  My  concern is if this lesion is a neoplasm and has contributed to his underlying pulmonary embolism which is occurred on this admission.  Thank you for involving me in the care of this patient.      LOS: 23 days   George Bellows, MD  08/20/2019, 10:52 AM

## 2019-08-20 NOTE — NC FL2 (Signed)
[place] LEVEL OF CARE SCREENING TOOL     IDENTIFICATION  Patient Name: George Wade. Birthdate: 22-Nov-1955 Sex: male Admission Date (Current Location): 07/28/2019  [place] and [place] Number:  Engineering geologist and Address:  [facility], [address] [place], [place] [address]      Provider Number: [id number]  Attending Physician Name and Address:  Ivor Costa, MD  Relative Name and Phone Number:       Current Level of Care: Hospital Recommended Level of Care: [place] Prior Approval Number:    Date Approved/Denied:   PASRR Number: [id number]  Discharge Plan: SNF    Current Diagnoses: Patient Active Problem List   Diagnosis Date Noted  . Rectal bleeding 08/20/2019  . PEG (percutaneous endoscopic gastrostomy) status ([place])   . Aspiration into airway   . Fall 08/15/2019  . Elevated LFTs   . Esophageal stricture   . Problems with swallowing and mastication   . Stricture and stenosis of esophagus   . Malnutrition of moderate degree 07/31/2019  . Nausea & vomiting   . Pulmonary emboli ([place]) 07/28/2019  . Alcohol-induced mood disorder ([place]) 07/28/2019  . Alcohol dependence with uncomplicated intoxication ([place])   . Acute respiratory failure with hypoxia ([place]) 05/15/2019  . Wernicke's encephalopathy 04/15/2019  . GI bleed 03/31/2019  . Alcohol withdrawal ([place]) 12/09/2017    Orientation RESPIRATION BLADDER Height & Weight     Self, Time, Situation, Place  Normal Incontinent, External catheter Weight: 184 lb (83.5 kg) Height:  5\' 8"  (172.7 cm)  BEHAVIORAL SYMPTOMS/MOOD NEUROLOGICAL BOWEL NUTRITION STATUS  (None)   Continent Feeding tube(PEG)  AMBULATORY STATUS COMMUNICATION OF NEEDS Skin   Limited Assist Verbally Skin abrasions, Bruising, Other (Comment), Surgical wounds(Excor[place].)                       Personal Care Assistance Level of Assistance  Dressing, Bathing, Feeding Bathing  Assistance: Limited assistance Feeding assistance: Independent Dressing Assistance: Limited assistance     Functional Limitations Info  Sight, Hearing, Speech Sight Info: Adequate Hearing Info: Adequate Speech Info: Adequate    SPECIAL CARE FACTORS FREQUENCY  PT (By licensed PT)     PT Frequency: 5 x week              Contractures Contractures Info: Not present    Additional Factors Info  Code Status, Allergies Code Status Info: Full code Allergies Info: Sulfa Antibiotics           Current Medications (08/20/2019):  This is the current hospital active medication list Current Facility-Administered Medications  Medication Dose Route Frequency Provider Last Rate Last Dose  . 0.9 %  sodium chloride infusion  250 mL Intravenous PRN Demetrios Loll, MD 10 mL/hr at 08/19/19 1058 250 mL at 08/19/19 1058  . acetaminophen (TYLENOL) tablet 650 mg  650 mg Oral Q6H PRN Demetrios Loll, MD       Or  . acetaminophen (TYLENOL) suppository 650 mg  650 mg Rectal Q6H PRN Demetrios Loll, MD      . albuterol (PROVENTIL) (2.5 MG/3ML) 0.083% nebulizer solution 2.5 mg  2.5 mg Nebulization Q2H PRN Demetrios Loll, MD   2.5 mg at 08/04/19 1012  . amoxicillin-clavulanate (AUGMENTIN) 875-125 MG per tablet 1 tablet  1 tablet Oral Q12H Lu Duffel, [facility]   1 tablet at 08/20/19 7106  . apixaban (ELIQUIS) tablet 5 mg  5 mg Per Tube BID Lu Duffel, [facility]  5 mg at 08/20/19 0843  . bisacodyl (DULCOLAX) EC tablet 5 mg  5 mg Oral Daily PRN Demetrios Loll, MD      . feeding supplement (OSMOLITE 1.5 CAL) liquid 1,000 mL  1,000 mL Per Tube Continuous Ivor Costa, MD 50 mL/hr at 08/19/19 1848 1,000 mL at 08/19/19 1848  . feeding supplement (PRO-STAT SUGAR FREE 64) liquid 30 mL  30 mL Per Tube Daily Flora Lipps, MD   30 mL at 08/20/19 0843  . folic acid (FOLVITE) tablet 1 mg  1 mg Per Tube Daily Spongberg, Audie Pinto, MD   1 mg at 08/20/19 0843  . free water 150 mL  150 mL Per Tube Q4H Lang Snow, NP    150 mL at 08/20/19 1243  . guaiFENesin-dextromethorphan (ROBITUSSIN DM) 100-10 MG/5ML syrup 5 mL  5 mL Oral Q4H PRN Lance Coon, MD      . haloperidol lactate (HALDOL) injection 2 mg  2 mg Intravenous Q6H PRN Marcell Anger, MD   2 mg at 08/14/19 1714  . HYDROcodone-acetaminophen (NORCO/VICODIN) 5-325 MG per tablet 1-2 tablet  1-2 tablet Oral Q4H PRN Demetrios Loll, MD   2 tablet at 08/10/19 1813  . HYDROcodone-acetaminophen (NORCO/VICODIN) 5-325 MG per tablet 1-2 tablet  1-2 tablet Oral Q4H PRN Arne Cleveland, MD   2 tablet at 08/20/19 0904  . HYDROmorphone (DILAUDID) injection 1 mg  1 mg Intravenous Q2H PRN Arne Cleveland, MD      . iodixanol (VISIPAQUE) 320 MG/ML injection 50 mL  50 mL Other Once PRN Arne Cleveland, MD      . metoprolol tartrate (LOPRESSOR) injection 5 mg  5 mg Intravenous Q6H PRN Lance Coon, MD   5 mg at 07/30/19 2314  . morphine 2 MG/ML injection 1 mg  1 mg Intravenous Q4H PRN Ivor Costa, MD   1 mg at 08/18/19 1807  . ondansetron (ZOFRAN) tablet 4 mg  4 mg Oral Q6H PRN Demetrios Loll, MD       Or  . ondansetron [facility]) injection 4 mg  4 mg Intravenous Q6H PRN Demetrios Loll, MD   4 mg at 08/20/19 0836  . ondansetron (ZOFRAN) injection 4 mg  4 mg Intravenous Q4H PRN Arne Cleveland, MD      . pantoprazole sodium (PROTONIX) 40 mg/20 mL oral suspension 40 mg  40 mg Per Tube BID Ivor Costa, MD      . polyvinyl alcohol (LIQUIFILM TEARS) 1.4 % ophthalmic solution 1 drop  1 drop Both Eyes PRN Ivor Costa, MD   1 drop at 08/19/19 2153  . promethazine (PHENERGAN) injection 25 mg  25 mg Intravenous Q6H PRN Bettey Costa, MD   25 mg at 08/08/19 0934  . senna-docusate (Senokot-S) tablet 1 tablet  1 tablet Oral QHS PRN Demetrios Loll, MD      . sodium chloride flush (NS) 0.9 % injection 3 mL  3 mL Intravenous Q12H Demetrios Loll, MD   3 mL at 08/20/19 0843  . sodium chloride flush (NS) 0.9 % injection 3 mL  3 mL Intravenous PRN Demetrios Loll, MD      . thiamine (VITAMIN B-1) tablet 100 mg  100  mg Per Tube Daily Wyonia Hough Audie Pinto, MD   100 mg at 08/20/19 1937     Discharge Medications: Please see discharge summary for a list of discharge medications.  Relevant Imaging Results:  Relevant Lab Results:   Additional Information SS#: [id number]. New PEG tube. Has a sitter due to intermittent confusion. Currently fully  oriented and pleasant. Medicaid application submitted 9/4. Due date 12/3. DSS Caseworker K. Revels. Gets SSI and says it's a little over $1000 per month. He does not want to sign over the whole thing because he helps pay for his son's college. Says he could afford maybe $800?  Candie Chroman, LCSW

## 2019-08-21 LAB — CBC
HCT: 33.6 % — ABNORMAL LOW (ref 39.0–52.0)
Hemoglobin: 9.7 g/dL — ABNORMAL LOW (ref 13.0–17.0)
MCH: 25.1 pg — ABNORMAL LOW (ref 26.0–34.0)
MCHC: 28.9 g/dL — ABNORMAL LOW (ref 30.0–36.0)
MCV: 86.8 fL (ref 80.0–100.0)
Platelets: 271 10*3/uL (ref 150–400)
RBC: 3.87 MIL/uL — ABNORMAL LOW (ref 4.22–5.81)
RDW: 24.4 % — ABNORMAL HIGH (ref 11.5–15.5)
WBC: 4.5 10*3/uL (ref 4.0–10.5)
nRBC: 0 % (ref 0.0–0.2)

## 2019-08-21 LAB — BASIC METABOLIC PANEL
Anion gap: 6 (ref 5–15)
BUN: 9 mg/dL (ref 8–23)
CO2: 28 mmol/L (ref 22–32)
Calcium: 8.5 mg/dL — ABNORMAL LOW (ref 8.9–10.3)
Chloride: 106 mmol/L (ref 98–111)
Creatinine, Ser: 0.54 mg/dL — ABNORMAL LOW (ref 0.61–1.24)
GFR calc Af Amer: >60 mL/min (ref >60)
GFR calc non Af Amer: >60 mL/min (ref >60)
Glucose, Bld: 95 mg/dL (ref 70–99)
Potassium: 4.1 mmol/L (ref 3.5–5.1)
Sodium: 140 mmol/L (ref 135–145)

## 2019-08-21 LAB — GLUCOSE, CAPILLARY: Glucose-Capillary: 85 mg/dL (ref 70–99)

## 2019-08-21 LAB — PHOSPHORUS: Phosphorus: 3.8 mg/dL (ref 2.5–4.6)

## 2019-08-21 LAB — MAGNESIUM: Magnesium: 2.2 mg/dL (ref 1.7–2.4)

## 2019-08-21 MED ORDER — HYDROCORTISONE 1 % EX CREA
TOPICAL_CREAM | Freq: Four times a day (QID) | CUTANEOUS | Status: DC | PRN
  Administered 2019-08-21 – 2019-08-23 (×3): via TOPICAL
  Filled 2019-08-21 (×2): qty 28

## 2019-08-21 NOTE — Plan of Care (Signed)
  Problem: Pain Managment: Goal: General experience of comfort will improve Outcome: Not Progressing Note: Patient continues to have rectal pain.

## 2019-08-21 NOTE — Evaluation (Signed)
Physical Therapy REEvaluation Patient Details Name: George Wade. MRN: [id number] DOB: 21-Nov-1955 Today's Date: 08/21/2019   History of Present Illness  Pt admitted for chest pain and now diagnosed with alcohol induced mood disorder. History includes alcohol abuse and HTN. Hospital stay complicated by transfer to CCU secondary to respiratory distress on 07/31/19 and then PEG tube placement on 10/26 secondary to severe esophageal stricture. Pt now pulled out PEG tube and has been agitated with RN staff. Pt is now s/p fall out of bed on 11/3. Pt re-evaluated this date as received new PEG tube on 11/7 and is less impulsive.  Clinical Impression  Re-evaluation performed this date secondary to new PEG tube placed. Pt very motivated to get OOB and ambulation. Pt performs bed mobility with cga, transfers with min assist, and ambulation with cga and RW. No fatigue present. Pt demonstrates deficits with strength/cognition. Would benefit from skilled PT to address above deficits and promote optimal return to PLOF. Recommend transition to [place] upon discharge from acute hospitalization.     Follow Up Recommendations Home health PT;Supervision for mobility/OOB    Equipment Recommendations  Rolling walker with 5" wheels    Recommendations for Other Services       Precautions / Restrictions Precautions Precautions: Fall Restrictions Weight Bearing Restrictions: No      Mobility  Bed Mobility Overal bed mobility: Needs Assistance Bed Mobility: Supine to Sit     Supine to sit: Min guard     General bed mobility comments: safe technique, cues to wait for therapist prior to initiation. Once seated, able to sit with upright posture  Transfers Overall transfer level: Needs assistance Equipment used: Rolling walker (2 wheeled) Transfers: Sit to/from Stand Sit to Stand: Min assist         General transfer comment: safe hand placement. +2 for equipment. Once standing, upright posture  noted  Ambulation/Gait Ambulation/Gait assistance: Min guard Gait Distance (Feet): 300 Feet Assistive device: Rolling walker (2 wheeled) Gait Pattern/deviations: Step-through pattern     General Gait Details: ambulated with reciprocal gait pattern and safe technique. No fatigue noted, follows commands well  Stairs            Wheelchair Mobility    Modified Rankin (Stroke Patients Only)       Balance Overall balance assessment: Needs assistance;History of Falls Sitting-balance support: Feet supported Sitting balance-Leahy Scale: Good     Standing balance support: Bilateral upper extremity supported Standing balance-Leahy Scale: Good                               Pertinent Vitals/Pain Pain Assessment: No/denies pain    Home Living Family/patient expects to be discharged to:: Shelter/Homeless                 Additional Comments: per notes, has been living out of his truck.    Prior Function Level of Independence: Independent         Comments: Indep with ADLs, household and community mobilization without assist device. Reports no falls, however poor historian     Hand Dominance        Extremity/Trunk Assessment   Upper Extremity Assessment Upper Extremity Assessment: Generalized weakness(B LE grossly 4/5)    Lower Extremity Assessment Lower Extremity Assessment: Generalized weakness(B LE grossly 4/5)       Communication   Communication: No difficulties  Cognition Arousal/Alertness: Awake/alert Behavior During Therapy: WFL for tasks assessed/performed Overall Cognitive  Status: Within Functional Limits for tasks assessed                                 General Comments: disoriented to situation      General Comments      Exercises Other Exercises Other Exercises: supine/seated B LE ther-ex performed including AP, SLRs, hip abd/add, and LAQ. All ther-ex performed x 12 reps with supervision   Assessment/Plan     PT Assessment Patient needs continued PT services  PT Problem List         PT Treatment Interventions DME instruction;Gait training;Therapeutic activities;Therapeutic exercise;Balance training    PT Goals (Current goals can be found in the Care Plan section)  Acute Rehab PT Goals Patient Stated Goal: to get stronger PT Goal Formulation: With patient Time For Goal Achievement: 09/04/19 Potential to Achieve Goals: Good    Frequency Min 2X/week   Barriers to discharge        Co-evaluation               AM-PAC PT "6 Clicks" Mobility  Outcome Measure Help needed turning from your back to your side while in a flat bed without using bedrails?: None Help needed moving from lying on your back to sitting on the side of a flat bed without using bedrails?: None Help needed moving to and from a bed to a chair (including a wheelchair)?: A Little Help needed standing up from a chair using your arms (e.g., wheelchair or bedside chair)?: A Little Help needed to walk in hospital room?: A Little Help needed climbing 3-5 steps with a railing? : A Little 6 Click Score: 20    End of Session Equipment Utilized During Treatment: Gait belt Activity Tolerance: Patient tolerated treatment well Patient left: in chair;with chair alarm set;with nursing/sitter in room Nurse Communication: Mobility status PT Visit Diagnosis: Muscle weakness (generalized) (M62.81);Difficulty in walking, not elsewhere classified (R26.2);History of falling (Z91.81);Pain;Unsteadiness on feet (R26.81)    Time: 0258-5277 PT Time Calculation (min) (ACUTE ONLY): 27 min   Charges:   PT Evaluation $PT Re-evaluation: 1 Re-eval PT Treatments $Therapeutic Exercise: 8-22 mins        Greggory Stallion, PT, DPT [phone]   Aiken Withem 08/21/2019, 3:28 PM

## 2019-08-21 NOTE — Progress Notes (Signed)
Patient is forgetful. Patient became agitated and restless this shift, administered haldol. Patient has apologized for any inconvenience. Tele sitter has noted patient pulling on PEG tube. Patient has been reminded to keep PEG tube in place.

## 2019-08-21 NOTE — Progress Notes (Addendum)
At pts request, pts money, ID and debit card, and watch returned to pt by security officer Mclamb, pt informed that he is now responsible for these items, states that he takes full responsibility for items

## 2019-08-21 NOTE — Progress Notes (Signed)
George Wade , MD [address] [place], [place], [place], [address] [address], [place], [place], [place], [address] Phone: [phone]  Fax: [phone]   George Denault. is being followed for rectal bleeding  Day 1 of follow up   Subjective: Feels well no rectal bleeding    Objective: Vital signs in last 24 hours: Vitals:   08/20/19 2001 08/21/19 0500 08/21/19 0609 08/21/19 0754  BP: 101/83  110/80 (!) 114/97  Pulse: 97  81 92  Resp: 16  18 20   Temp: 97.7 F (36.5 C)  98 F (36.7 C) 98.3 F (36.8 C)  TempSrc: Oral  Oral Oral  SpO2: 95%  96% 98%  Weight:  83.9 kg    Height:       Weight change:   Intake/Output Summary (Last 24 hours) at 08/21/2019 8841 Last data filed at 08/21/2019 0827 Gross per 24 hour  Intake -  Output 2425 ml  Net -2425 ml     Exam: Alert and oriented x3 not in any pain or distress  Lab Results: @LABTEST2 @ Micro Results: No results found for this or any previous visit (from the past 240 hour(s)). Studies/Results: No results found. Medications: I have reviewed the patient's current medications. Scheduled Meds: . amoxicillin-clavulanate  1 tablet Oral Q12H  . apixaban  5 mg Per Tube BID  . feeding supplement (PRO-STAT SUGAR FREE 64)  30 mL Per Tube Daily  . folic acid  1 mg Per Tube Daily  . free water  150 mL Per Tube Q4H  . pantoprazole sodium  40 mg Per Tube BID  . sodium chloride flush  3 mL Intravenous Q12H  . thiamine  100 mg Per Tube Daily   Continuous Infusions: . sodium chloride 250 mL (08/19/19 1058)  . feeding supplement (OSMOLITE 1.5 CAL) 1,000 mL (08/20/19 1830)   PRN Meds:.sodium chloride, acetaminophen **OR** acetaminophen, albuterol, bisacodyl, guaiFENesin-dextromethorphan, haloperidol lactate, HYDROcodone-acetaminophen, HYDROcodone-acetaminophen, HYDROmorphone (DILAUDID) injection, iodixanol, metoprolol tartrate, morphine injection, ondansetron **OR** ondansetron (ZOFRAN) IV, ondansetron (ZOFRAN) IV,  polyvinyl alcohol, promethazine, senna-docusate, sodium chloride flush   Assessment: Principal Problem:   Alcohol-induced mood disorder (HCC) Active Problems:   Wernicke's encephalopathy   Alcohol dependence with uncomplicated intoxication (HCC)   Pulmonary emboli (HCC)   Nausea & vomiting   Malnutrition of moderate degree   Problems with swallowing and mastication   Stricture and stenosis of esophagus   Fall   Elevated LFTs   Esophageal stricture   Aspiration into airway   PEG (percutaneous endoscopic gastrostomy) status (HCC)   Rectal bleeding   Rectal mass   George A Eesa Justiss. is a 63 y.o. y/o male admitted with an acute pulmonary embolism on Eliquis.  He has a G-tube which has been placed by interventional radiology for dysphagia.  I have been consulted for a 0.3 g drop in hemoglobin and a history of rectal bleeding.   When I performed a perianal exam on him today he has a large diffuse fungating lesion originating from the anus.  The mucosa closer to the anus appears to be erythematous and there is probably degree of stenosis of his anus.  It is very likely that the bleeding seen as being from this lesion.    Plan 1. Seen by surgery and oncology and planned referral to [facility] 2. No further GI evaluation   I will sign off.  Please call me if any further GI concerns or questions.  We would like to thank you for the opportunity  to participate in the care of George A Jonell Cluck..    LOS: 24 days   George Bellows, MD 08/21/2019, 9:09 AM

## 2019-08-21 NOTE — TOC Progression Note (Addendum)
Transition of Care (TOC) - Progression Note    Patient Details  Name: George Wade. MRN: [id number] Date of Birth: Oct 08, 1956  Transition [facility]) CM/SW Contact  Candie Chroman, LCSW Phone Number: 08/21/2019, 1:45 PM  Clinical Narrative: At this time, patient does not meet criteria for a letter of guarantee for SNF placement. CSW has asked PT to see him again. Discussed with patient. He is asking CSW to help him find a trailer to move into. CSW explained that is a whole process that is outside this CSW's scope. Patient unable to return to the RV. CSW encouraged patient to use his computer that he has in the room to look up possible places to stay. He feels like he can afford $500 per month. CSW is asking coworkers if they are familiar with any extended stay motels in the area. Staying with family is not an option.       Expected Discharge Plan and Services                                                 Social Determinants of Health (SDOH) Interventions    Readmission Risk Interventions No flowsheet data found.

## 2019-08-21 NOTE — Progress Notes (Signed)
Nutrition Follow Up Note   DOCUMENTATION CODES:   Non-severe (moderate) malnutrition in context of social or environmental circumstances  INTERVENTION:   Osmolite 1.5 @50ml /hr  Prostat liquid protein 30 ml daily via tube, each supplement provides 100 kcal, 15 grams protein.  Free water flushes 1107m q4 hours    Regimen provides 1900kcal/day, 90g/day protein, 15102HE/NIDfree water   Folic acid and thiamine daily in setting of etoh abuse   Bowel regimen as needed per MD  NUTRITION DIAGNOSIS:   Moderate Malnutrition related to social / environmental circumstances(EtOH abuse, limited access to well-balanced meals) as evidenced by mild-moderate fat depletion, mild-moderate muscle depletion.  GOAL:   Patient will meet greater than or equal to 90% of their needs  -met with tube feeds   MONITOR:   Labs, Weight trends, TF tolerance, Skin, I & O's  ASSESSMENT:   63year old male with PMHx of HTN, hx non-Hodgkin's lymphoma, hx tonsillar cancer, EtOH abuse admitted with small right lung pulmonary emboli, chronic iron deficiency anemia, dysphagia.   Pt s/p EGD 10/23 found to have benign appearing esophageal stenosis which was dilated. Scope could not be passed beyond stricture.  Pt s/p IR replacement of 16 Fr balloon retention gastrostomy tube 11/6  Pt s/p IR G-tube placement; pt tolerating tube feeds well at goal rate. Per chart, pt with weight gain since admit. Pt with good urine output. No edema noted. Unsure if weights are accurate as these are bed weights.   Medications reviewed and include: protonix, folic acid & thiamine  Labs reviewed: K 4.1 wnl, P 3.8 wnl, Mg 2.2 wnl Hgb 9.7(L), Hct 33.6(L)  Diet Order:   Diet Order            Diet NPO time specified Except for: Ice Chips  Diet effective now             EDUCATION NEEDS:   No education needs have been identified at this time  Skin:  Skin Assessment: Reviewed RN Assessment  Last BM:  11/10- type 7  Height:    Ht Readings from Last 1 Encounters:  08/17/19 5' 8"  (1.727 m)   Weight:   Wt Readings from Last 1 Encounters:  08/21/19 83.9 kg   Ideal Body Weight:  70 kg  BMI:  Body mass index is 28.13 kg/m.  Estimated Nutritional Needs:   Kcal:  1800-2000  Protein:  90-100 grams  Fluid:  1.8-2 L/day  CKoleen DistanceMS, RD, LDN Pager #- [phone]#- [phone] Hours Pager: [phone]

## 2019-08-21 NOTE — Progress Notes (Signed)
PROGRESS NOTE    George Wade.  TTS:[id number] DOB: September 02, 1956 DOA: 07/28/2019 PCP: Patient, [name] Per    Brief Narrative:   George Wade  is a 63 y.o. male with a known history of new medical problems as below. The patient presents the ED with above chief complaints.  The patient presents the ED with above chief complaints.  He has had chest pain in the central area, which is extirpated by cough and swallowing food.  He also complains of shortness breath and cough.  He has been drinking alcohol on daily basis.  The last drink was yesterday.  He denies any tremors or shaking.  He is found elevated d-dimer and CT angiogram of the chest report right-sided PE.  [name] requested admission and will start heparin drip.  Interim History: -PEG tube is placed by [name] from IR 11/6 -X-ray of abdomen showed PEG tube position [facility] 11/7 -11/8: start tube feeding, pt has nausea and vomited several times with nonbiliary nonbloody vomitus.  No diarrhea or abdominal pain. -11/9: pt had rectal bleeding, GI, [name] of GI, [name] of Surgery and [name] of Oncology were consulted. Pt was known by 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 No indication for biopsy at this time. he should be referred to a colorectal surgeon for evaluation of this. In September he preferred [facility].   -11/9: Hgb 9.9 -->11.0 -->11.1 - 11/10: Mg 2.2, K 4.1, and phosphorus 3.8  Assessment & Plan:   Principal Problem:   Alcohol-induced mood disorder (HCC) Active Problems:   Wernicke's encephalopathy   Alcohol dependence with uncomplicated intoxication ([place])   Pulmonary emboli (HCC)   Nausea & vomiting   Malnutrition of moderate degree   Problems with swallowing and mastication   Stricture and stenosis of esophagus   Fall   Elevated LFTs   Esophageal stricture   Aspiration into airway   PEG (percutaneous  endoscopic gastrostomy) status (HCC)   Rectal bleeding   Rectal mass  Rectal bleeding and rectal mass  pt had rectal bleeding. GI, [name] of GI, [name] of Surgery and [name] of Oncology were consulted. Pt was known by [name] since pt was seen in our clinic in September of 2020. 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 No indication for biopsy at this time. he should be referred to a colorectal surgeon for evaluation of this. In September he preferred [facility].  Hgb stable. Hgb 9.9 -->11.0 -->11.1 --> 9.7. I tried to find a Sports administrator in [place]. I called consulation line, [phone]. They gave the gastrointestinal surgeons division number, [phone] and [phone], I called both numbers, not successful since their office is closed this PM. -on protonix 40 orally bid - will continue Eliquis for now  -repeat CBC in AM and monitoring Hgb level -pt will need to be referred to a colorectal surgeon for evaluation of this. In September he preferred [facility].  Acute hypoxic and hypercapnic respiratory failure due to aspiration pneumonia-resolved. Weaned off oxygen with documented oxygen saturation of 96% on room air this morning. PT recommended SNF, but pt does not have insurance.  Case manager is working on placement. -Patient was treated with IV Unasyn. -Continue duonebsnebulization therapy for now  Dysphagia- having severe aspiration episodes: GI was consulted. EGD 10/22 with benign-appearing for esophageal stenosis that was dilated. Patient  status post PEG tube placement by interventional radiologist on 08/06/2019. Patient has pulled out PEG tube on 08/10/2019 in the evening. IR was consulted for PEG placement. [name] said that pt just had PEG tube placement on 10/26. Pt pulled out the tube. It is not appropriate and is risky to put PEG tube again. Pt is likely to pull out the tube again. He recommended to consider other way of feeing pt.  TPN? Very difficult situation. I am hoping his mental status improves and then try to put PEG tube again. On 08/16/19,  his mental status improved when I saw patient in the morning.  He is orientated x3.  He answered all questions appropriately.  He even promised me that he will never pull out PEG tube if it is placed again. Since pt's mental status improved. Re-consulted IR, [name] placed PEG tube 11/6 which is highly appreciated. X-ray of abdomen showed PEG tube position is OK.   1. started tube feeding 11/7. Has some nausea, mild vomiting, mild abdominal pain. No diarrhea.   -Monitor refeeding syndrome, check magnesium, BMP, phosphorus  Malnutrition of moderate degree: -started tube feeding 11/7 -consulted nutrition  Small right lung pulmonary emboli-  -restarted Eliquis 11/7 and d/c'ed levenox  Chronic iron deficiency anemia- hemoglobin is lower than baseline. Hgb 9.3, 8.8,10.6 -->10.2-->10.2-->11.1 -->9.7 -s/p IV iron x1 on 10/18 -Continue to monitor -Stable currently   DTs with history of EtOH abuse, and alcohol-induced mood disorder and wernicke's encephalopathy:  Resolved. Patient weaned off Precedex drip and transferred out of ICU. Psychiatry consultedfor evaluation of psychosis-appreciate their input-request that they reevaluate secondary to behavioral nonadherence versus delirium. Psychiatry wrote for Haldol as needed on initial consultation -Continue thiamine, folate and multivitamin.   Elevated LFTs- likely due to alcohol use. AST/ALT improved. Right upper quadrant ultrasound showed hepatic steatosis -Monitor  Tobacco use -Nicotine patch ordered  Fall with lip laceration: Oral cavity lip laceration now 12 hours out from injury with small tooth fragment in depths of wound.  [name] from ENT was consulted. "I offered the patient the option of numbing the area and probing the depths of the wound to see if I might locate this small dental fragment, and possibly  placing a couple of loose stiches to better align this flap of tissue but he refused". -appreciate [name] consultation. -started Augmentin 11/4 --> changed to unasyn -->Changed back to Augmentin  Left hand cellulitis: resolved. Felt to be probably due to bug bite. Patient seen by orthopedic physician . No evidence of deeper infection that would require I&D at this time. Was treated with IV Unasyn recently,clinical monitoring. Appreciate their follow-up.   DVT prophylaxis: on Lovenos  Code Status: fall Family Communication: I called his sister by phone. Disposition Plan: pt need to go to SNF, when bed is available, patient can be discharged. Barriers for discharge:    Consultants:   ENT  Psychiatry  IR  Procedures:  EGD  PEG tube placement 11/6  Antimicrobials: Anti-infectives (From admission, onward)   Start     Dose/Rate Route Frequency Ordered Stop   08/18/19 1000  amoxicillin-clavulanate (AUGMENTIN) 875-125 MG per tablet 1 tablet     1 tablet Oral Every 12 hours 08/17/19 1309     08/17/19 2200  amoxicillin-clavulanate (AUGMENTIN) 875-125 MG per tablet 1 tablet  Status:  Discontinued     1 tablet Per Tube Every 12 hours 08/17/19 1132 08/17/19 1309   08/17/19 1315  amoxicillin-clavulanate (AUGMENTIN) 400-57 MG/5ML suspension 800 mg  800 mg of amoxicillin Oral Every 12 hours 08/17/19 1309 08/17/19 2153   08/15/19 1215  amoxicillin-clavulanate (AUGMENTIN) 875-125 MG per tablet 1 tablet  Status:  Discontinued     1 tablet Oral Every 12 hours 08/15/19 1212 08/17/19 1132   08/06/19 1415  ceFAZolin (ANCEF) IVPB 2g/100 mL premix     2 g 200 mL/hr over 30 Minutes Intravenous  Once 08/06/19 1403 08/06/19 1530   08/06/19 1406  ceFAZolin (ANCEF) 2-4 GM/100ML-% IVPB    Note to Pharmacy: Fransico Michael   : cabinet override      08/06/19 1406 08/07/19 0214   08/03/19 2000  fluconazole (DIFLUCAN) IVPB 200 mg  Status:  Discontinued     200 mg 100 mL/hr over 60 Minutes  Intravenous Every 24 hours 08/03/19 1954 08/07/19 1115   07/31/19 0630  Ampicillin-Sulbactam (UNASYN) 3 g in sodium chloride 0.9 % 100 mL IVPB  Status:  Discontinued     3 g 200 mL/hr over 30 Minutes Intravenous Every 6 hours 07/31/19 0620 08/09/19 1011         Subjective:  pt has nausea and vomiting with nonbilious nonbloody vomitus after starting tube feeding.  No abdominal pain or diarrhea.  No fever or chills.  Has somefright facial pain. Pt is alert, orientated x3.  Objective: Vitals:   08/20/19 1835 08/20/19 2001 08/21/19 0500 08/21/19 0609  BP: 104/82 101/83  110/80  Pulse: (!) 101 97  81  Resp: 18 16  18   Temp: 98.7 F (37.1 C) 97.7 F (36.5 C)  98 F (36.7 C)  TempSrc: Oral Oral  Oral  SpO2: 96% 95%  96%  Weight:   83.9 kg   Height:        Intake/Output Summary (Last 24 hours) at 08/21/2019 0701 Last data filed at 08/21/2019 0600 Gross per 24 hour  Intake 3 ml  Output 2275 ml  Net -2272 ml   Filed Weights   08/17/19 1126 08/19/19 0602 08/21/19 0500  Weight: 72.2 kg 83.5 kg 83.9 kg    Examination: Physical Exam:  General: Not in acute distress.  Dry mucosal membrane HEENT: PERRL, EOMI, no scleral icterus, No JVD or bruit. Has laceration of the interior of the upper lip with a flap of tissue. Cardiac: S1/S2, RRR, No murmurs, gallops or rubs Pulm: Clear to auscultation bilaterally. No rales, wheezing, rhonchi or rubs. Abd: Soft, nondistended, nontender, no rebound pain, no organomegaly, BS present Ext: No edema. 2+DP/PT pulse bilaterally Musculoskeletal: No joint deformities, erythema, or stiffness, ROM full Skin: No rashes.  Neuro: currently oriented X3, cranial nerves II-XII grossly intact. Psych: Patient is not psychotic, no suicidal or hemocidal ideation.    Data Reviewed: I have personally reviewed following labs and imaging studies  CBC: Recent Labs  Lab 08/17/19 0627 08/18/19 0458 08/20/19 0405 08/20/19 1107 08/20/19 1724  WBC 4.4 4.2  4.6 7.9 7.5  HGB 9.7* 10.2* 9.9* 11.0* 11.1*  HCT 32.9* 33.9* 34.6* 35.5* 37.6*  MCV 83.7 84.1 87.2 81.8 85.8  PLT 352 340 319 356 254   Basic Metabolic Panel: Recent Labs  Lab 08/16/19 0448 08/17/19 0627 08/18/19 0458 08/19/19 0554 08/20/19 0405  NA 140 136 138 137 141  K 3.8 3.0* 3.6 3.4* 4.1  CL 111 107 107 106 109  CO2 15* 21* 23 23 26   GLUCOSE 74 89 95 124* 95  BUN <5* <5* <5* <5* 5*  CREATININE 0.60* 0.46* 0.47* 0.44* 0.50*  CALCIUM 8.6* 8.2* 8.4* 8.3* 8.3*  MG 2.0 1.6*  1.9 1.7 2.5*  PHOS 3.7 3.2 3.1 3.1 3.3   GFR: Estimated Creatinine Clearance: 99.7 mL/min (A) (by C-G formula based on SCr of 0.5 mg/dL (L)). Liver Function Tests: Recent Labs  Lab 08/16/19 0006  AST 21  ALT 17  ALKPHOS 92  BILITOT 1.3*  PROT 5.9*  ALBUMIN 2.8*   No results for input(s): LIPASE, AMYLASE in the last 168 hours. No results for input(s): AMMONIA in the last 168 hours. Coagulation Profile: Recent Labs  Lab 08/17/19 0627  INR 1.2   Cardiac Enzymes: No results for input(s): CKTOTAL, CKMB, CKMBINDEX, TROPONINI in the last 168 hours. BNP (last 3 results) No results for input(s): PROBNP in the last 8760 hours. HbA1C: No results for input(s): HGBA1C in the last 72 hours. CBG: No results for input(s): GLUCAP in the last 168 hours. Lipid Profile: No results for input(s): CHOL, HDL, LDLCALC, TRIG, CHOLHDL, LDLDIRECT in the last 72 hours. Thyroid Function Tests: No results for input(s): TSH, T4TOTAL, FREET4, T3FREE, THYROIDAB in the last 72 hours. Anemia Panel: No results for input(s): VITAMINB12, FOLATE, FERRITIN, TIBC, IRON, RETICCTPCT in the last 72 hours. Sepsis Labs: No results for input(s): PROCALCITON, LATICACIDVEN in the last 168 hours.  No results found for this or any previous visit (from the past 240 hour(s)).    Radiology Studies: No results found.   Scheduled Meds: . amoxicillin-clavulanate  1 tablet Oral Q12H  . apixaban  5 mg Per Tube BID  . feeding  supplement (PRO-STAT SUGAR FREE 64)  30 mL Per Tube Daily  . folic acid  1 mg Per Tube Daily  . free water  150 mL Per Tube Q4H  . pantoprazole sodium  40 mg Per Tube BID  . sodium chloride flush  3 mL Intravenous Q12H  . thiamine  100 mg Per Tube Daily   Continuous Infusions: . sodium chloride 250 mL (08/19/19 1058)  . feeding supplement (OSMOLITE 1.5 CAL) 1,000 mL (08/20/19 1830)     LOS: 24 days    Time spent: Whitley, DO [facility] PAGER is on [place]  If 7PM-7AM, please contact night-coverage [email or web address] Password [email or web address] 08/21/2019, 7:01 AM

## 2019-08-22 DIAGNOSIS — K6289 Other specified diseases of anus and rectum: Secondary | ICD-10-CM

## 2019-08-22 DIAGNOSIS — R7989 Other specified abnormal findings of blood chemistry: Secondary | ICD-10-CM

## 2019-08-22 DIAGNOSIS — A630 Anogenital (venereal) warts: Secondary | ICD-10-CM

## 2019-08-22 DIAGNOSIS — T17908D Unspecified foreign body in respiratory tract, part unspecified causing other injury, subsequent encounter: Secondary | ICD-10-CM

## 2019-08-22 DIAGNOSIS — Z931 Gastrostomy status: Secondary | ICD-10-CM

## 2019-08-22 LAB — BASIC METABOLIC PANEL
Anion gap: 5 (ref 5–15)
BUN: 11 mg/dL (ref 8–23)
CO2: 29 mmol/L (ref 22–32)
Calcium: 8.5 mg/dL — ABNORMAL LOW (ref 8.9–10.3)
Chloride: 105 mmol/L (ref 98–111)
Creatinine, Ser: 0.53 mg/dL — ABNORMAL LOW (ref 0.61–1.24)
GFR calc Af Amer: >60 mL/min (ref >60)
GFR calc non Af Amer: >60 mL/min (ref >60)
Glucose, Bld: 133 mg/dL — ABNORMAL HIGH (ref 70–99)
Potassium: 3.9 mmol/L (ref 3.5–5.1)
Sodium: 139 mmol/L (ref 135–145)

## 2019-08-22 LAB — CBC
HCT: 33.1 % — ABNORMAL LOW (ref 39.0–52.0)
Hemoglobin: 10.2 g/dL — ABNORMAL LOW (ref 13.0–17.0)
MCH: 25.2 pg — ABNORMAL LOW (ref 26.0–34.0)
MCHC: 30.8 g/dL (ref 30.0–36.0)
MCV: 81.9 fL (ref 80.0–100.0)
Platelets: 264 10*3/uL (ref 150–400)
RBC: 4.04 MIL/uL — ABNORMAL LOW (ref 4.22–5.81)
RDW: 24.1 % — ABNORMAL HIGH (ref 11.5–15.5)
WBC: 4.2 10*3/uL (ref 4.0–10.5)
nRBC: 0 % (ref 0.0–0.2)

## 2019-08-22 LAB — PHOSPHORUS: Phosphorus: 4.6 mg/dL (ref 2.5–4.6)

## 2019-08-22 LAB — MAGNESIUM: Magnesium: 1.9 mg/dL (ref 1.7–2.4)

## 2019-08-22 NOTE — Progress Notes (Signed)
Physical Therapy Treatment Patient Details Name: George Wade. MRN: [id number] DOB: November 11, 1955 Today's Date: 08/22/2019    History of Present Illness Pt admitted for chest pain and now diagnosed with alcohol induced mood disorder. History includes alcohol abuse and HTN. Hospital stay complicated by transfer to CCU secondary to respiratory distress on 07/31/19 and then PEG tube placement on 10/26 secondary to severe esophageal stricture. Pt now pulled out PEG tube and has been agitated with RN staff. Pt is now s/p fall out of bed on 11/3. Pt re-evaluated this date as received new PEG tube on 11/7 and is less impulsive.    PT Comments    Pt is making great progress towards goals with improved endurance/mobility efforts this date. Able to ambulate in hallway and perform supine/seated/standing therex. Vitals stable. Pt not impulsive and follow commands well. Encouraged further mobility with RN staff. Will continue to progress.   Follow Up Recommendations  Home health PT;Supervision for mobility/OOB     Equipment Recommendations  Rolling walker with 5" wheels    Recommendations for Other Services       Precautions / Restrictions Precautions Precautions: Fall Restrictions Weight Bearing Restrictions: No    Mobility  Bed Mobility Overal bed mobility: Needs Assistance Bed Mobility: Supine to Sit     Supine to sit: Supervision     General bed mobility comments: safe technique, not impulsive this date  Transfers Overall transfer level: Needs assistance Equipment used: Rolling walker (2 wheeled) Transfers: Sit to/from Stand Sit to Stand: Min guard         General transfer comment: improved technique with no cues for hand placement prior to mobility. Upright posture noted  Ambulation/Gait Ambulation/Gait assistance: Supervision Gait Distance (Feet): 300 Feet Assistive device: Rolling walker (2 wheeled) Gait Pattern/deviations: Step-through pattern     General Gait  Details: ambulated with reciprocal gait pattern and safe technique. No fatigue noted.   Stairs             Wheelchair Mobility    Modified Rankin (Stroke Patients Only)       Balance Overall balance assessment: Needs assistance;History of Falls Sitting-balance support: Feet supported Sitting balance-Leahy Scale: Good     Standing balance support: Bilateral upper extremity supported Standing balance-Leahy Scale: Good                              Cognition Arousal/Alertness: Awake/alert Behavior During Therapy: WFL for tasks assessed/performed Overall Cognitive Status: Within Functional Limits for tasks assessed                                        Exercises Other Exercises Other Exercises: supine/seated/standing ther-ex performed including B LE bridging, hip add squeezes, LAQ, alt. marching, sit<>Stand, heel raises, and standing hip abd. All ther-ex performed x 15 reps with supervision and safe technique Other Exercises: Performed 5 time sit<>Stand x 18 second.    General Comments        Pertinent Vitals/Pain Pain Assessment: No/denies pain    Home Living                      Prior Function            PT Goals (current goals can now be found in the care plan section) Acute Rehab PT Goals Patient Stated Goal: to get  stronger PT Goal Formulation: With patient Time For Goal Achievement: 09/04/19 Potential to Achieve Goals: Good Progress towards PT goals: Progressing toward goals    Frequency    Min 2X/week      PT Plan Current plan remains appropriate    Co-evaluation              AM-PAC PT "6 Clicks" Mobility   Outcome Measure  Help needed turning from your back to your side while in a flat bed without using bedrails?: None Help needed moving from lying on your back to sitting on the side of a flat bed without using bedrails?: None Help needed moving to and from a bed to a chair (including a  wheelchair)?: None Help needed standing up from a chair using your arms (e.g., wheelchair or bedside chair)?: None Help needed to walk in hospital room?: None Help needed climbing 3-5 steps with a railing? : A Little 6 Click Score: 23    End of Session Equipment Utilized During Treatment: Gait belt Activity Tolerance: Patient tolerated treatment well Patient left: in bed;with bed alarm set Nurse Communication: Mobility status PT Visit Diagnosis: Muscle weakness (generalized) (M62.81);Difficulty in walking, not elsewhere classified (R26.2);History of falling (Z91.81);Pain;Unsteadiness on feet (R26.81)     Time: 6979-4801 PT Time Calculation (min) (ACUTE ONLY): 23 min  Charges:  $Gait Training: 8-22 mins $Therapeutic Exercise: 8-22 mins                     George Wade, PT, DPT [phone]    George Wade 08/22/2019, 4:17 PM

## 2019-08-22 NOTE — TOC Progression Note (Addendum)
Transition of Care (TOC) - Progression Note    Patient Details  Name: George Wade. MRN: [id number] Date of Birth: 1956-04-02  Transition of Care [facility]) CM/SW Contact  Candie Chroman, LCSW Phone Number: 08/22/2019, 12:02 PM  Clinical Narrative: Gave patient address, phone number, and cost for [facility] and [employer] Patient asked about a facility he went to after his ED visit on October 7. He said it's in the direction of the back of the hospital and down a gravel road. CSW is not familiar with this place. He said he stayed there for two days. So far no home health agencies have accepted patient. [facility] is doing charity care this week but cannot take him because they do not currently have a nurse to make sure he is managing his peg tube.    3:47 pm: Patient said the building he was talking about earlier is the [employer] but this is where medication management is? Patient said he has started learning how to manage his peg on his own but does not feel comfortable quite yet. CSW encouraged patient to call the hotels tonight or this morning so we could get our plan together. [facility] has been approved for a home health nurse once per week for two weeks. CSW is checking with agencies to see if anyone can take him.        Expected Discharge Plan and Services                                                 Social Determinants of Health (SDOH) Interventions    Readmission Risk Interventions No flowsheet data found.

## 2019-08-22 NOTE — Progress Notes (Signed)
PROGRESS NOTE    George Wade.  IDP:[id number] DOB: May 02, 1956 DOA: 07/28/2019 PCP: Patient, [name] Per    Brief Narrative:  [name] a62 y.o.malewith a known history of new medical problems as below. The patient presents the ED with above chief complaints.The patient presents the ED with above chief complaints. He has had chest pain in the central area, which is extirpated by cough and swallowing food. He also complains of shortness breath and cough. He has been drinking alcohol on daily basis. The last drink was yesterday. He denies any tremors or shaking. He is found elevated d-dimer and CT angiogram of the chest report right-sided PE. [name] requestedadmission and willstart heparin drip.  Interim History: -PEG tube is placed by [name] from IR 11/6 -X-ray of abdomen showed PEG tube position [facility] 11/7 -11/8: start tube feeding, pt has nausea and vomited several times with nonbiliary nonbloody vomitus.  No diarrhea or abdominal pain. -11/9: pt had rectal bleeding, GI, [name] of GI, [name] of Surgery and [name] of Oncology were consulted. Pt was known by 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 No indication for biopsy at this time. he should be referred to a colorectal surgeon for evaluation of this. In September he preferred [name]  -11/9: Hgb 9.9 -->11.0 -->11.1 - 11/10: Mg 2.2, K 4.1, and phosphorus 3.8    Assessment & Plan:   Principal Problem:   Alcohol-induced mood disorder (HCC) Active Problems:   Wernicke's encephalopathy   Alcohol dependence with uncomplicated intoxication ([place])   Pulmonary emboli (HCC)   Nausea & vomiting   Malnutrition of moderate degree   Problems with swallowing and mastication   Stricture and stenosis of esophagus   Fall   Elevated LFTs   Esophageal stricture   Aspiration into airway   PEG (percutaneous  endoscopic gastrostomy) status (HCC)   Rectal bleeding   Rectal mass   Anal condylomata  1 rectal bleeding and 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  Patient was seen by GI, [name] as well as [name] of general surgery and [name] of oncology who will follow the patient.  Patient well-known to [name] as patient was seen in the clinic September 2020.  Patient noted to have a large anal mass which appeared most consistent with anogenital condylomata.  There was no indication for biopsy at this time and per general surgery patient needs to be referred to a colorectal surgeon for further evaluation.  It was noted that patient was initially referred to [name] in September however did not make the appointment.  Hemoglobin currently stable.  Patient on Eliquis.  Will need referral to colorectal surgery post discharge for further evaluation.  Patient could likely follow-up with general surgeon in the outpatient setting who could arrange the referral.  Appreciate general surgery, GI and oncology's input and recommendations.  2.  Acute hypoxic and hypercapnic respiratory failure due to aspiration pneumonia Resolved.  Patient currently on room air with good sats.  Patient speaking in full sentences.  Patient currently receiving PEG tube feedings and is currently n.p.o. except for ice chips.  Status post IV Unasyn.  Continue duo nebs.  Follow.  3.  Dysphagia/benign-appearing esophageal stenosis status post dilatation/status post PEG tube placement by IR x2 Patient noted to have severe aspiration episodes.  Patient was seen by GI underwent upper endoscopy 08/02/2019 that showed a benign-appearing esophageal stenosis  status post dilation.  Patient underwent PEG tube placement by IR on 08/06/2019.  Patient pulled PEG tube on 08/10/2019 in the evening IR reconsulted for PEG tube placement.  Initially per [name] patient just had a PEG tube placed on 1026 and patient pulled out the tube and  was felt at that time not appropriate and risky to place PEG tube again.  Patient started on PEG tube feeds.  Patient's mental status improved on 08/16/2019 and patient promised Dr.Niu that he would not pull out PEG tube if it was placed again.  IR reconsulted and PEG tube placed on 08/17/2019.  Plain films done showed PEG tube in good position.  Tube feeds started on 08/18/2019.  Currently tolerating tube feeds.  Follow.  4.  Moderate protein calorie malnutrition Nutritionist consulted.  Patient on tube feeds via PEG tube.  5.  Small right lung pulmonary emboli Patient was on Lovenox which has been discontinued.  Patient started on Eliquis on 08/18/2019.  Outpatient follow-up.  6.  Chronic iron deficiency anemia Status post IV iron 07/29/2019.  Currently stable at 10.2.  Follow.  7.  History of DTs with history of alcohol abuse/alcohol induced mood disorder/Warnicke's encephalopathy Resolved.  Patient was placed on the Precedex drip which has been subsequently weaned off.  Patient transferred out of the ICU.  Patient was seen in consultation by psychiatry for psychosis and patient was reevaluated secondary to behavioral nonadherence versus delirium.  Psychiatry wrote for Haldol as needed on initial consultation.  Continue thiamine, folic acid, multivitamin.  Follow.  8.  Tobacco use Nicotine patch.  9.  Elevated LFTs Likely secondary to alcohol use.  Right upper quadrant ultrasound with hepatic steatosis.  Outpatient follow-up.  15.  Fall with lip laceration Patient noted to have a lip laceration from injury with small tooth fragments in the depths of the wound.  [name] from ENT was consulted.  Patient was offered option of numbing the area and probing the depths of the wound to see if might locate small dental fragment and possibly placing couple of loose stitches to better align his flap of tissue but patient declined.  Patient started on Augmentin 08/15/2019 transition to Unasyn and back to  Augmentin will need to complete a 10-day course of antibiotic treatment.  11.  Left hand cellulitis Resolved.  Felt secondary to bug bite.  Patient seen by orthopedic physician.  No evidence of deeper infection that would require I&D.  Patient received IV Unasyn and currently on Augmentin.  DVT prophylaxis: Eliquis Code Status: Full Family Communication: Updated patient.  No family at bedside. Disposition Plan: To be determined.  Likely SNF when bed available.   Consultants:   Oncology: [name] 08/20/2019  General surgery: [name] 08/20/2019  Gastroenterology: Napoleon Form 08/20/2019  Palliative care: Asencion Gowda, NP 08/16/2019  ENT surgery: [name] 08/15/2019  Psychiatry: Waylan Boga, NP 08/11/2019  Orthopedics: Dr. Rudene Christians 08/05/2019  PCCM: Marda Stalker, NP  Gastroenterology: Dr. Bonna Gains 07/29/2019  Procedures:   PEG tube placement 08/17/2019  CT angiogram chest 07/28/2019  CT head CT maxillofacial 08/14/2019  Abdominal films 08/08/2019 08/18/2019  EGD with benign esophageal stenosis with dilatation per Dr. Allen Norris 08/02/2019  Abdominal ultrasound 08/01/2019  Antimicrobials:   Augmentin 08/17/2019>>>> 08/23/2019  IV Ancef 08/06/2019>>>> 08/07/2019  IV Unasyn 07/31/2019>>>>> 08/09/2019   Subjective: Patient sitting up in bed.  Denies any chest pain or shortness of breath.  Complaining of pain in his anal region that he states GI doctor was following up on  I will see him today.  Objective: Vitals:   08/21/19 1935 08/22/19 0408 08/22/19 0411 08/22/19 0800  BP: 101/80 107/88  119/84  Pulse: 90 78  98  Resp: 20 17  20   Temp: 97.8 F (36.6 C) 97.8 F (36.6 C)  98.1 F (36.7 C)  TempSrc: Oral Oral  Oral  SpO2: 99% 96%  97%  Weight:   68.5 kg   Height:        Intake/Output Summary (Last 24 hours) at 08/22/2019 1458 Last data filed at 08/22/2019 1015 Gross per 24 hour  Intake 8254.83 ml  Output 2050 ml  Net 6204.83 ml   Filed Weights   08/19/19  0602 08/21/19 0500 08/22/19 0411  Weight: 83.5 kg 83.9 kg 68.5 kg    Examination:  General exam: Appears calm and comfortable  Respiratory system: Clear to auscultation. Respiratory effort normal. Cardiovascular system: S1 & S2 heard, RRR. No JVD, murmurs, rubs, gallops or clicks. No pedal edema. Gastrointestinal system: Abdomen is nondistended, soft and nontender. No organomegaly or masses felt. Normal bowel sounds heard.PEG tube intact. Central nervous system: Alert and oriented. No focal neurological deficits. Extremities: Symmetric 5 x 5 power. Skin: No rashes, lesions or ulcers Psychiatry: Judgement and insight appear normal. Mood & affect appropriate.     Data Reviewed: I have personally reviewed following labs and imaging studies  CBC: Recent Labs  Lab 08/20/19 0405 08/20/19 1107 08/20/19 1724 08/21/19 0545 08/22/19 0534  WBC 4.6 7.9 7.5 4.5 4.2  HGB 9.9* 11.0* 11.1* 9.7* 10.2*  HCT 34.6* 35.5* 37.6* 33.6* 33.1*  MCV 87.2 81.8 85.8 86.8 81.9  PLT 319 356 354 271 790   Basic Metabolic Panel: Recent Labs  Lab 08/18/19 0458 08/19/19 0554 08/20/19 0405 08/21/19 0545 08/22/19 0534  NA 138 137 141 140 139  K 3.6 3.4* 4.1 4.1 3.9  CL 107 106 109 106 105  CO2 23 23 26 28 29   GLUCOSE 95 124* 95 95 133*  BUN <5* <5* 5* 9 11  CREATININE 0.47* 0.44* 0.50* 0.54* 0.53*  CALCIUM 8.4* 8.3* 8.3* 8.5* 8.5*  MG 1.9 1.7 2.5* 2.2 1.9  PHOS 3.1 3.1 3.3 3.8 4.6   GFR: Estimated Creatinine Clearance: 91.4 mL/min (A) (by C-G formula based on SCr of 0.53 mg/dL (L)). Liver Function Tests: Recent Labs  Lab 08/16/19 0006  AST 21  ALT 17  ALKPHOS 92  BILITOT 1.3*  PROT 5.9*  ALBUMIN 2.8*   No results for input(s): LIPASE, AMYLASE in the last 168 hours. No results for input(s): AMMONIA in the last 168 hours. Coagulation Profile: Recent Labs  Lab 08/17/19 0627  INR 1.2   Cardiac Enzymes: No results for input(s): CKTOTAL, CKMB, CKMBINDEX, TROPONINI in the last 168  hours. BNP (last 3 results) No results for input(s): PROBNP in the last 8760 hours. HbA1C: No results for input(s): HGBA1C in the last 72 hours. CBG: Recent Labs  Lab 08/21/19 2119  GLUCAP 85   Lipid Profile: No results for input(s): CHOL, HDL, LDLCALC, TRIG, CHOLHDL, LDLDIRECT in the last 72 hours. Thyroid Function Tests: No results for input(s): TSH, T4TOTAL, FREET4, T3FREE, THYROIDAB in the last 72 hours. Anemia Panel: No results for input(s): VITAMINB12, FOLATE, FERRITIN, TIBC, IRON, RETICCTPCT in the last 72 hours. Sepsis Labs: No results for input(s): PROCALCITON, LATICACIDVEN in the last 168 hours.  No results found for this or any previous visit (from the past 240 hour(s)).       Radiology Studies: No results found.  Scheduled Meds:  amoxicillin-clavulanate  1 tablet Oral Q12H   apixaban  5 mg Per Tube BID   feeding supplement (PRO-STAT SUGAR FREE 64)  30 mL Per Tube Daily   folic acid  1 mg Per Tube Daily   free water  150 mL Per Tube Q4H   pantoprazole sodium  40 mg Per Tube BID   sodium chloride flush  3 mL Intravenous Q12H   thiamine  100 mg Per Tube Daily   Continuous Infusions:  sodium chloride 250 mL (08/19/19 1058)   feeding supplement (OSMOLITE 1.5 CAL) 1,000 mL (08/21/19 1213)     LOS: 25 days    Time spent: 40 minutes    Irine Seal, MD [facility]  If 7PM-7AM, please contact night-coverage [email or web address] 08/22/2019, 2:58 PM

## 2019-08-23 LAB — BASIC METABOLIC PANEL
Anion gap: 8 (ref 5–15)
BUN: 9 mg/dL (ref 8–23)
CO2: 27 mmol/L (ref 22–32)
Calcium: 8.9 mg/dL (ref 8.9–10.3)
Chloride: 102 mmol/L (ref 98–111)
Creatinine, Ser: 0.4 mg/dL — ABNORMAL LOW (ref 0.61–1.24)
GFR calc Af Amer: >60 mL/min (ref >60)
GFR calc non Af Amer: >60 mL/min (ref >60)
Glucose, Bld: 132 mg/dL — ABNORMAL HIGH (ref 70–99)
Potassium: 4.1 mmol/L (ref 3.5–5.1)
Sodium: 137 mmol/L (ref 135–145)

## 2019-08-23 LAB — HEMOGLOBIN AND HEMATOCRIT, BLOOD
HCT: 36.8 % — ABNORMAL LOW (ref 39.0–52.0)
Hemoglobin: 11.3 g/dL — ABNORMAL LOW (ref 13.0–17.0)

## 2019-08-23 NOTE — Plan of Care (Signed)
  Problem: Education: Goal: Knowledge of General Education information will improve Description: Including pain rating scale, medication(s)/side effects and non-pharmacologic comfort measures Outcome: Progressing   Problem: Health Behavior/Discharge Planning: Goal: Ability to manage health-related needs will improve Outcome: Progressing   Problem: Clinical Measurements: Goal: Ability to maintain clinical measurements within normal limits will improve Outcome: Progressing Goal: Respiratory complications will improve Outcome: Progressing Goal: Cardiovascular complication will be avoided Outcome: Progressing   Problem: Elimination: Goal: Will not experience complications related to bowel motility Outcome: Progressing   Problem: Pain Managment: Goal: General experience of comfort will improve Outcome: Not Progressing

## 2019-08-23 NOTE — Progress Notes (Addendum)
PROGRESS NOTE    [name].  ZOX:[id number] DOB: June 20, 1956 DOA: 07/28/2019 PCP: Patient, [name] Per    Brief Narrative:  [name] a62 y.o.malewith a known history of new medical problems as below. The patient presents the ED with above chief complaints.The patient presents the ED with above chief complaints. He has had chest pain in the central area, which is extirpated by cough and swallowing food. He also complains of shortness breath and cough. He has been drinking alcohol on daily basis. The last drink was yesterday. He denies any tremors or shaking. He is found elevated d-dimer and CT angiogram of the chest report right-sided PE. [name] requestedadmission and willstart heparin drip.  Interim History: -PEG tube is placed by [name] from IR 11/6 -X-ray of abdomen showed PEG tube position [name] 11/7 -11/8: start tube feeding, pt has nausea and vomited several times with nonbiliary nonbloody vomitus.  No diarrhea or abdominal pain. -11/9: pt had rectal bleeding, GI, [name] of GI, [name] of Surgery and [name] of Oncology were consulted. Pt was known by 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 No indication for biopsy at this time. he should be referred to a colorectal surgeon for evaluation of this. In September he preferred [facility]  -11/9: Hgb 9.9 -->11.0 -->11.1 - 11/10: Mg 2.2, K 4.1, and phosphorus 3.8    Assessment & Plan:   Principal Problem:   Alcohol-induced mood disorder (HCC) Active Problems:   Wernicke's encephalopathy   Alcohol dependence with uncomplicated intoxication ([place])   Pulmonary emboli (HCC)   Nausea & vomiting   Malnutrition of moderate degree   Problems with swallowing and mastication   Stricture and stenosis of esophagus   Fall   Elevated LFTs   Esophageal stricture   Aspiration into airway   PEG (percutaneous  endoscopic gastrostomy) status (HCC)   Rectal bleeding   Rectal mass   Anal condylomata  1 rectal bleeding and 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  Patient was seen by GI, [name] as well as [name] of general surgery and [name] of oncology who will follow the patient.  Patient well-known to [name] as patient was seen in the clinic September 2020.  Patient noted to have a large anal mass which appeared most consistent with anogenital condylomata.  There was no indication for biopsy at this time and per general surgery patient needs to be referred to a colorectal surgeon for further evaluation.  It was noted that patient was initially referred to [facility] in September however did not make the appointment.  Hemoglobin currently stable.  Patient on Eliquis.  Will need referral to colorectal surgery post discharge for further evaluation.  Patient could likely follow-up with general surgeon in the outpatient setting who could arrange the referral.  Appreciate general surgery, GI and oncology's input and recommendations.  2.  Acute hypoxic and hypercapnic respiratory failure due to aspiration pneumonia Resolved.  Patient currently on room air with good sats.  Patient speaking in full sentences.  Patient currently receiving PEG tube feedings and is currently n.p.o. except for ice chips.  Status post IV Unasyn.  Continue duo nebs.  Follow.  3.  Dysphagia/benign-appearing esophageal stenosis status post dilatation/status post PEG tube placement by IR x2 Patient noted to have severe aspiration episodes.  Patient was seen by GI underwent upper endoscopy 08/02/2019 that showed a benign-appearing esophageal stenosis  status post dilation.  Patient underwent PEG tube placement by IR on 08/06/2019.  Patient pulled PEG tube on 08/10/2019 in the evening IR reconsulted for PEG tube placement.  Initially per [name] patient just had a PEG tube placed on 1026 and patient pulled out the tube and  was felt at that time not appropriate and risky to place PEG tube again.  Patient started on PEG tube feeds.  Patient's mental status improved on 08/16/2019 and patient promised Dr.Niu that he would not pull out PEG tube if it was placed again.  IR reconsulted and PEG tube placed on 08/17/2019.  Plain films done showed PEG tube in good position.  Tube feeds started on 08/18/2019.  Currently tolerating tube feeds.  May need to be transition to bolus tube feeds if patient unable to go to a skilled nursing facility.  Follow.  4.  Moderate protein calorie malnutrition Nutritionist consulted.  Patient on tube feeds via PEG tube.  May need to be transition to bolus feeds if patient unable to go to a skilled nursing facility.  5.  Small right lung pulmonary emboli Patient was on Lovenox which has been discontinued.  Patient started on Eliquis on 08/18/2019.  Outpatient follow-up.  6.  Chronic iron deficiency anemia Status post IV iron 07/29/2019.  Currently stable at 11.3.  Follow.  7.  History of DTs with history of alcohol abuse/alcohol induced mood disorder/Warnicke's encephalopathy Resolved.  Patient was placed on the Precedex drip which has been subsequently weaned off.  Patient transferred out of the ICU.  Patient was seen in consultation by psychiatry for psychosis and patient was reevaluated secondary to behavioral nonadherence versus delirium.  Psychiatry wrote for Haldol as needed on initial consultation.  Continue thiamine, folic acid, multivitamin.  Follow.  8.  Tobacco use Continue nicotine patch.   9.  Elevated LFTs Likely secondary to alcohol use.  Right upper quadrant ultrasound with hepatic steatosis.  Outpatient follow-up.  30.  Fall with lip laceration Patient noted to have a lip laceration from injury with small tooth fragments in the depths of the wound.  [name] from ENT was consulted.  Patient was offered option of numbing the area and probing the depths of the wound to see if  might locate small dental fragment and possibly placing couple of loose stitches to better align his flap of tissue but patient declined.  Patient started on Augmentin 08/15/2019 transitioned to Unasyn and back to Augmentin will need to complete a 10-day course of antibiotic treatment.  11.  Left hand cellulitis Resolved.  Felt secondary to bug bite.  Patient seen by orthopedic physician.  No evidence of deeper infection that would require I&D.  Patient received IV Unasyn and currently on Augmentin.  DVT prophylaxis: Eliquis Code Status: Full Family Communication: Updated patient.  No family at bedside. Disposition Plan: To be determined.  Likely SNF if patient will be accepted into SNF as patient currently homeless and likely unsafe and needs to be in a environment where he could be monitored closely and PEG tube monitored with tube feeds.   Consultants:   Oncology: [name] 08/20/2019  General surgery: [name] 08/20/2019  Gastroenterology: Napoleon Form 08/20/2019  Palliative care: Asencion Gowda, NP 08/16/2019  ENT surgery: [name] 08/15/2019  Psychiatry: Waylan Boga, NP 08/11/2019  Orthopedics: Dr. Rudene Christians 08/05/2019  PCCM: Marda Stalker, NP  Gastroenterology: Dr. Bonna Gains 07/29/2019  Procedures:   PEG tube placement 08/17/2019  CT angiogram chest 07/28/2019  CT head CT maxillofacial 08/14/2019  Abdominal films 08/08/2019 08/18/2019  EGD with benign esophageal stenosis with dilatation per Dr. Allen Norris 08/02/2019  Abdominal ultrasound 08/01/2019  Antimicrobials:   Augmentin 08/17/2019>>>> 08/23/2019  IV Ancef 08/06/2019>>>> 08/07/2019  IV Unasyn 07/31/2019>>>>> 08/09/2019   Subjective: Patient sitting up in bed getting tube feeds.  Denies any chest pain or shortness of breath.    Objective: Vitals:   08/22/19 1546 08/22/19 1937 08/23/19 0354 08/23/19 0836  BP: 114/89 108/82 102/84 (!) 117/97  Pulse: 99 95 94 (!) 103  Resp: 20   18  Temp: 97.6 F (36.4 C) 98.1 F  (36.7 C) 98.5 F (36.9 C) 98.8 F (37.1 C)  TempSrc: Oral   Oral  SpO2: 97% 98% 97% 97%  Weight:   68.3 kg   Height:        Intake/Output Summary (Last 24 hours) at 08/23/2019 1142 Last data filed at 08/23/2019 0837 Gross per 24 hour  Intake 1750 ml  Output 2485 ml  Net -735 ml   Filed Weights   08/21/19 0500 08/22/19 0411 08/23/19 0354  Weight: 83.9 kg 68.5 kg 68.3 kg    Examination:  General exam: NAD Respiratory system: Clear to auscultation bilaterally.  No wheezes, no crackles, no rhonchi.  Cardiovascular system: Regular rate rhythm no murmurs rubs or gallops.  No JVD.  No lower extremity edema.  Gastrointestinal system: Abdomen is soft, nontender, nondistended, positive bowel sounds.  PEG tube intact. Central nervous system: Alert and oriented. No focal neurological deficits. Extremities: Symmetric 5 x 5 power. Skin: No rashes, lesions or ulcers Psychiatry: Judgement and insight appear normal. Mood & affect appropriate.     Data Reviewed: I have personally reviewed following labs and imaging studies  CBC: Recent Labs  Lab 08/20/19 0405 08/20/19 1107 08/20/19 1724 08/21/19 0545 08/22/19 0534 08/23/19 0603  WBC 4.6 7.9 7.5 4.5 4.2  --   HGB 9.9* 11.0* 11.1* 9.7* 10.2* 11.3*  HCT 34.6* 35.5* 37.6* 33.6* 33.1* 36.8*  MCV 87.2 81.8 85.8 86.8 81.9  --   PLT 319 356 354 271 264  --    Basic Metabolic Panel: Recent Labs  Lab 08/18/19 0458 08/19/19 0554 08/20/19 0405 08/21/19 0545 08/22/19 0534 08/23/19 0603  NA 138 137 141 140 139 137  K 3.6 3.4* 4.1 4.1 3.9 4.1  CL 107 106 109 106 105 102  CO2 23 23 26 28 29 27   GLUCOSE 95 124* 95 95 133* 132*  BUN <5* <5* 5* 9 11 9   CREATININE 0.47* 0.44* 0.50* 0.54* 0.53* 0.40*  CALCIUM 8.4* 8.3* 8.3* 8.5* 8.5* 8.9  MG 1.9 1.7 2.5* 2.2 1.9  --   PHOS 3.1 3.1 3.3 3.8 4.6  --    GFR: Estimated Creatinine Clearance: 91.3 mL/min (A) (by C-G formula based on SCr of 0.4 mg/dL (L)). Liver Function Tests: No results  for input(s): AST, ALT, ALKPHOS, BILITOT, PROT, ALBUMIN in the last 168 hours. No results for input(s): LIPASE, AMYLASE in the last 168 hours. No results for input(s): AMMONIA in the last 168 hours. Coagulation Profile: Recent Labs  Lab 08/17/19 0627  INR 1.2   Cardiac Enzymes: No results for input(s): CKTOTAL, CKMB, CKMBINDEX, TROPONINI in the last 168 hours. BNP (last 3 results) No results for input(s): PROBNP in the last 8760 hours. HbA1C: No results for input(s): HGBA1C in the last 72 hours. CBG: Recent Labs  Lab 08/21/19 2119  GLUCAP 85   Lipid Profile: No results for input(s): CHOL, HDL, LDLCALC, TRIG, CHOLHDL, LDLDIRECT in the last 72  hours. Thyroid Function Tests: No results for input(s): TSH, T4TOTAL, FREET4, T3FREE, THYROIDAB in the last 72 hours. Anemia Panel: No results for input(s): VITAMINB12, FOLATE, FERRITIN, TIBC, IRON, RETICCTPCT in the last 72 hours. Sepsis Labs: No results for input(s): PROCALCITON, LATICACIDVEN in the last 168 hours.  No results found for this or any previous visit (from the past 240 hour(s)).       Radiology Studies: No results found.      Scheduled Meds:  amoxicillin-clavulanate  1 tablet Oral Q12H   apixaban  5 mg Per Tube BID   feeding supplement (PRO-STAT SUGAR FREE 64)  30 mL Per Tube Daily   folic acid  1 mg Per Tube Daily   free water  150 mL Per Tube Q4H   pantoprazole sodium  40 mg Per Tube BID   sodium chloride flush  3 mL Intravenous Q12H   thiamine  100 mg Per Tube Daily   Continuous Infusions:  sodium chloride 250 mL (08/19/19 1058)   feeding supplement (OSMOLITE 1.5 CAL) 1,000 mL (08/21/19 1213)     LOS: 26 days    Time spent: 35 minutes    Irine Seal, MD [facility]  If 7PM-7AM, please contact night-coverage [email or web address] 08/23/2019, 11:42 AM

## 2019-08-23 NOTE — Progress Notes (Signed)
Physical Therapy Treatment Patient Details Name: George Wade. MRN: [id number] DOB: 06-02-1956 Today's Date: 08/23/2019    History of Present Illness Pt admitted for chest pain and now diagnosed with alcohol induced mood disorder. History includes alcohol abuse and HTN. Hospital stay complicated by transfer to CCU secondary to respiratory distress on 07/31/19 and then PEG tube placement on 10/26 secondary to severe esophageal stricture. Pt now pulled out PEG tube and has been agitated with RN staff. Pt is now s/p fall out of bed on 11/3. Pt re-evaluated this date as received new PEG tube on 11/7 and is less impulsive.    PT Comments    Pt is making good progress towards goals with improved balance this date including static/dynamic. INcreased gait distance and pt not impulsive. Plan to perform further balance training and stair training next date. Pt motivated and enjoys to work with therapy.   Follow Up Recommendations  Home health PT;Supervision for mobility/OOB     Equipment Recommendations  Rolling walker with 5" wheels    Recommendations for Other Services       Precautions / Restrictions Precautions Precautions: Fall Restrictions Weight Bearing Restrictions: No    Mobility  Bed Mobility Overal bed mobility: Needs Assistance Bed Mobility: Supine to Sit     Supine to sit: Modified independent (Device/Increase time)     General bed mobility comments: safe technique with ease of transfer  Transfers Overall transfer level: Needs assistance Equipment used: Rolling walker (2 wheeled) Transfers: Sit to/from Stand Sit to Stand: Supervision         General transfer comment: safe, fluid transfer. Upright posture noted  Ambulation/Gait Ambulation/Gait assistance: Supervision Gait Distance (Feet): 450 Feet Assistive device: Rolling walker (2 wheeled) Gait Pattern/deviations: Step-through pattern     General Gait Details: ambulated with reciprocal gait pattern  and safe technique. Good speed and able to carry conversation this date.   Stairs             Wheelchair Mobility    Modified Rankin (Stroke Patients Only)       Balance Overall balance assessment: Needs assistance;History of Falls Sitting-balance support: Feet supported Sitting balance-Leahy Scale: Good     Standing balance support: Bilateral upper extremity supported Standing balance-Leahy Scale: Good                              Cognition Arousal/Alertness: Awake/alert Behavior During Therapy: WFL for tasks assessed/performed Overall Cognitive Status: Within Functional Limits for tasks assessed                                        Exercises Other Exercises Other Exercises: Seated ther-ex performed including alt. marching, and alt. LE extension with arm raise. Standing ther-ex including heel raises, alt. marching, and LE hip extension. All ther-ex performed x 20 reps with supervision. Used window sill for balance/support Other Exercises: Performed 5 time sit<>Stand in 16 seconds this date    General Comments        Pertinent Vitals/Pain Pain Assessment: No/denies pain    Home Living                      Prior Function            PT Goals (current goals can now be found in the care plan section) Acute Rehab  PT Goals Patient Stated Goal: to get stronger PT Goal Formulation: With patient Time For Goal Achievement: 09/04/19 Potential to Achieve Goals: Good Progress towards PT goals: Progressing toward goals    Frequency    Min 2X/week      PT Plan Current plan remains appropriate    Co-evaluation              AM-PAC PT "6 Clicks" Mobility   Outcome Measure  Help needed turning from your back to your side while in a flat bed without using bedrails?: None Help needed moving from lying on your back to sitting on the side of a flat bed without using bedrails?: None Help needed moving to and from a bed  to a chair (including a wheelchair)?: None Help needed standing up from a chair using your arms (e.g., wheelchair or bedside chair)?: None Help needed to walk in hospital room?: None Help needed climbing 3-5 steps with a railing? : A Little 6 Click Score: 23    End of Session Equipment Utilized During Treatment: Gait belt Activity Tolerance: Patient tolerated treatment well Patient left: in bed;with bed alarm set Nurse Communication: Mobility status PT Visit Diagnosis: Muscle weakness (generalized) (M62.81);Difficulty in walking, not elsewhere classified (R26.2);History of falling (Z91.81);Pain;Unsteadiness on feet (R26.81)     Time: 0092-3300 PT Time Calculation (min) (ACUTE ONLY): 27 min  Charges:  $Gait Training: 8-22 mins $Therapeutic Exercise: 8-22 mins                     George Wade, PT, DPT [phone]    George Wade 08/23/2019, 4:08 PM

## 2019-08-23 NOTE — TOC Progression Note (Addendum)
Transition of Care (TOC) - Progression Note    Patient Details  Name: George Wade. MRN: [id number] Date of Birth: July 28, 1956  Transition of Care [facility]) CM/SW [place], LCSW Phone Number: 08/23/2019, 12:51 PM  Clinical Narrative: No home health agency can accept patient due to staffing issues/not taking LOG's. Met with patient to provide update. He called the hotels yesterday and confirmed their information. He will also called the [facility] shelter today to check on bed availability. Patient will need extensive education on peg tube maintenance prior to discharge.  3:51 pm: CSW followed up with patient. He has left messages at the local shelter. He also spoke with a woman at the [facility] where he says they let him stay for a night after he left the hospital 10/7. The woman was going to see what she could do and call him back. CSW left voicemail for shelter director to check on bed availability.      Expected Discharge Plan and Services                                                 Social Determinants of Health (SDOH) Interventions    Readmission Risk Interventions No flowsheet data found.

## 2019-08-24 MED ORDER — OSMOLITE 1.5 CAL PO LIQD: 237.0000 mL | Freq: Three times a day (TID) | ORAL | Status: DC

## 2019-08-24 MED ORDER — SENNOSIDES-DOCUSATE SODIUM 8.6-50 MG PO TABS
1.0000 | ORAL_TABLET | Freq: Two times a day (BID) | ORAL | Status: DC
  Administered 2019-08-24 – 2019-08-29 (×6): 1 via ORAL
  Filled 2019-08-24 (×11): qty 1

## 2019-08-24 NOTE — Progress Notes (Signed)
PROGRESS NOTE    [name].  UXL:[id number] DOB: [birth year] DOA: 07/28/2019 PCP: Patient, [name] Per    Brief Narrative:  [name] a62 y.o.malewith a known history of new medical problems as below. The patient presents the ED with above chief complaints.The patient presents the ED with above chief complaints. He has had chest pain in the central area, which is extirpated by cough and swallowing food. He also complains of shortness breath and cough. He has been drinking alcohol on daily basis. The last drink was yesterday. He denies any tremors or shaking. He is found elevated d-dimer and CT angiogram of the chest report right-sided PE. [name] requestedadmission and willstart heparin drip.  Interim History: -PEG tube is placed by [name] from IR 11/6 -X-ray of abdomen showed PEG tube position [facility] 11/7 -11/8: start tube feeding, pt has nausea and vomited several times with nonbiliary nonbloody vomitus.  No diarrhea or abdominal pain. -11/9: pt had rectal bleeding, GI, [name] of GI, [name] of Surgery and [name] of Oncology were consulted. Pt was known by 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 No indication for biopsy at this time. he should be referred to a colorectal surgeon for evaluation of this. In September he preferred [facility].  -11/9: Hgb 9.9 -->11.0 -->11.1 - 11/10: Mg 2.2, K 4.1, and phosphorus 3.8    Assessment & Plan:   Principal Problem:   Alcohol-induced mood disorder (HCC) Active Problems:   Wernicke's encephalopathy   Alcohol dependence with uncomplicated intoxication ([place])   Pulmonary emboli (HCC)   Nausea & vomiting   Malnutrition of moderate degree   Problems with swallowing and mastication   Stricture and stenosis of esophagus   Fall   Elevated LFTs   Esophageal stricture   Aspiration into airway   PEG (percutaneous  endoscopic gastrostomy) status (HCC)   Rectal bleeding   Rectal mass   Anal condylomata  1 rectal bleeding and 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  Patient was seen by GI, [name] as well as [name] of general surgery and [name] of oncology who will follow the patient.  Patient well-known to [name] as patient was seen in the clinic September 2020.  Patient noted to have a large anal mass which appeared most consistent with anogenital condylomata.  There was no indication for biopsy at this time and per general surgery patient needs to be referred to a colorectal surgeon for further evaluation.  It was noted that patient was initially referred to [facility] in September however did not make the appointment.  Hemoglobin currently stable.  Patient on Eliquis.  Will need referral to colorectal surgery post discharge for further evaluation.  Patient could likely follow-up with general surgeon in the outpatient setting who could arrange the referral.  Appreciate general surgery, GI and oncology's input and recommendations.  2.  Acute hypoxic and hypercapnic respiratory failure due to aspiration pneumonia Resolved.  Patient currently on room air with good sats.  Patient speaking in full sentences.  Patient currently receiving PEG tube feedings and is currently n.p.o. except for ice chips.  Status post IV Unasyn.  Continue duo nebs.  Follow.  3.  Dysphagia/benign-appearing esophageal stenosis status post dilatation/status post PEG tube placement by IR x2 Patient noted to have severe aspiration episodes.  Patient was seen by GI underwent upper endoscopy 08/02/2019 that showed a benign-appearing esophageal stenosis  status post dilation.  Patient underwent PEG tube placement by IR on 08/06/2019.  Patient pulled PEG tube on 08/10/2019 in the evening IR reconsulted for PEG tube placement.  Initially per [name] patient just had a PEG tube placed on 1026 and patient pulled out the tube and  was felt at that time not appropriate and risky to place PEG tube again.  Patient started on PEG tube feeds.  Patient's mental status improved on 08/16/2019 and patient promised Dr.Niu that he would not pull out PEG tube if it was placed again.  IR reconsulted and PEG tube placed on 08/17/2019.  Plain films done showed PEG tube in good position.  Tube feeds started on 08/18/2019.  Currently tolerating tube feeds.  May need to be transition to bolus tube feeds if patient unable to go to a skilled nursing facility.  Consult with dietitian for bolus feeds.  Follow.  4.  Moderate protein calorie malnutrition Nutritionist consulted.  Patient on tube feeds via PEG tube.  May need to be transition to bolus feeds if patient unable to go to a skilled nursing facility.  Consult with dietitian for bolus feeds.  5.  Small right lung pulmonary emboli Patient was on Lovenox which has been discontinued.  Patient started on Eliquis on 08/18/2019.  Outpatient follow-up.  6.  Chronic iron deficiency anemia Status post IV iron 07/29/2019.  Currently stable at 11.3.  Follow.  7.  History of DTs with history of alcohol abuse/alcohol induced mood disorder/Warnicke's encephalopathy Resolved.  Patient was placed on the Precedex drip which has been subsequently weaned off.  Patient transferred out of the ICU.  Patient was seen in consultation by psychiatry for psychosis and patient was reevaluated secondary to behavioral nonadherence versus delirium.  Psychiatry wrote for Haldol as needed on initial consultation.  Continue thiamine, folic acid, multivitamin.  Follow.  8.  Tobacco use Stable.  Nicotine patch.  9.  Elevated LFTs Likely secondary to alcohol use.  Right upper quadrant ultrasound with hepatic steatosis.  Outpatient follow-up.  33.  Fall with lip laceration Patient noted to have a lip laceration from injury with small tooth fragments in the depths of the wound.  [name] from ENT was consulted.  Patient was  offered option of numbing the area and probing the depths of the wound to see if might locate small dental fragment and possibly placing couple of loose stitches to better align his flap of tissue but patient declined.  Patient started on Augmentin 08/15/2019 transitioned to Unasyn and back to Augmentin will need to complete a 10-day course of antibiotic treatment.  11.  Left hand cellulitis Resolved.  Felt secondary to bug bite.  Patient seen by orthopedic physician.  No evidence of deeper infection that would require I&D.  Patient received IV Unasyn and currently on Augmentin.  DVT prophylaxis: Eliquis Code Status: Full Family Communication: Updated patient.  No family at bedside. Disposition Plan: To be determined.  Likely SNF if patient will be accepted into SNF as patient currently homeless and likely unsafe and needs to be in a environment where he could be monitored closely and PEG tube monitored with tube feeds.   Consultants:   Oncology: [name] 08/20/2019  General surgery: [name] 08/20/2019  Gastroenterology: Napoleon Form 08/20/2019  Palliative care: Asencion Gowda, NP 08/16/2019  ENT surgery: [name] 08/15/2019  Psychiatry: Waylan Boga, NP 08/11/2019  Orthopedics: Dr. Rudene Christians 08/05/2019  PCCM: Marda Stalker, NP  Gastroenterology: Dr. Bonna Gains 07/29/2019  Procedures:   PEG  tube placement 08/17/2019  CT angiogram chest 07/28/2019  CT head CT maxillofacial 08/14/2019  Abdominal films 08/08/2019 08/18/2019  EGD with benign esophageal stenosis with dilatation per Dr. Allen Norris 08/02/2019  Abdominal ultrasound 08/01/2019  Antimicrobials:   Augmentin 08/17/2019>>>> 08/23/2019  IV Ancef 08/06/2019>>>> 08/07/2019  IV Unasyn 07/31/2019>>>>> 08/09/2019   Subjective: Patient sitting up in bed.  Denies any shortness of breath.  Still with complaints of upper abdominal lower chest pain which has been ongoing prior to admission.  Complaining of anorectal lesion.   Objective:  Vitals:   08/23/19 1535 08/23/19 1924 08/24/19 0501 08/24/19 0733  BP: 102/74 91/69 102/86 (!) 108/92  Pulse: 98 88 90 100  Resp: 18 20 20    Temp: 97.9 F (36.6 C) 98.2 F (36.8 C)  98.5 F (36.9 C)  TempSrc:  Oral  Oral  SpO2: 97% 94% 93% 97%  Weight:   67.9 kg   Height:        Intake/Output Summary (Last 24 hours) at 08/24/2019 1136 Last data filed at 08/24/2019 0930 Gross per 24 hour  Intake 3174.67 ml  Output 1830 ml  Net 1344.67 ml   Filed Weights   08/22/19 0411 08/23/19 0354 08/24/19 0501  Weight: 68.5 kg 68.3 kg 67.9 kg    Examination:  General exam: NAD Respiratory system: Clear to auscultation bilaterally.  No wheezes, no crackles, no rhonchi.  Normal respiratory effort.   Cardiovascular system: RRR no murmurs rubs or gallops.  No JVD.  No lower extremity edema.   Gastrointestinal system: Abdomen is nontender, nondistended, soft, positive bowel sounds.  PEG tube intact. Central nervous system: Alert and oriented. No focal neurological deficits. Extremities: Symmetric 5 x 5 power. Skin: No rashes, lesions or ulcers Psychiatry: Judgement and insight appear normal. Mood & affect appropriate.     Data Reviewed: I have personally reviewed following labs and imaging studies  CBC: Recent Labs  Lab 08/20/19 0405 08/20/19 1107 08/20/19 1724 08/21/19 0545 08/22/19 0534 08/23/19 0603  WBC 4.6 7.9 7.5 4.5 4.2  --   HGB 9.9* 11.0* 11.1* 9.7* 10.2* 11.3*  HCT 34.6* 35.5* 37.6* 33.6* 33.1* 36.8*  MCV 87.2 81.8 85.8 86.8 81.9  --   PLT 319 356 354 271 264  --    Basic Metabolic Panel: Recent Labs  Lab 08/18/19 0458 08/19/19 0554 08/20/19 0405 08/21/19 0545 08/22/19 0534 08/23/19 0603  NA 138 137 141 140 139 137  K 3.6 3.4* 4.1 4.1 3.9 4.1  CL 107 106 109 106 105 102  CO2 23 23 26 28 29 27   GLUCOSE 95 124* 95 95 133* 132*  BUN <5* <5* 5* 9 11 9   CREATININE 0.47* 0.44* 0.50* 0.54* 0.53* 0.40*  CALCIUM 8.4* 8.3* 8.3* 8.5* 8.5* 8.9  MG 1.9 1.7 2.5* 2.2  1.9  --   PHOS 3.1 3.1 3.3 3.8 4.6  --    GFR: Estimated Creatinine Clearance: 90.8 mL/min (A) (by C-G formula based on SCr of 0.4 mg/dL (L)). Liver Function Tests: No results for input(s): AST, ALT, ALKPHOS, BILITOT, PROT, ALBUMIN in the last 168 hours. No results for input(s): LIPASE, AMYLASE in the last 168 hours. No results for input(s): AMMONIA in the last 168 hours. Coagulation Profile: No results for input(s): INR, PROTIME in the last 168 hours. Cardiac Enzymes: No results for input(s): CKTOTAL, CKMB, CKMBINDEX, TROPONINI in the last 168 hours. BNP (last 3 results) No results for input(s): PROBNP in the last 8760 hours. HbA1C: No results for input(s): HGBA1C in the last  72 hours. CBG: Recent Labs  Lab 08/21/19 2119  GLUCAP 85   Lipid Profile: No results for input(s): CHOL, HDL, LDLCALC, TRIG, CHOLHDL, LDLDIRECT in the last 72 hours. Thyroid Function Tests: No results for input(s): TSH, T4TOTAL, FREET4, T3FREE, THYROIDAB in the last 72 hours. Anemia Panel: No results for input(s): VITAMINB12, FOLATE, FERRITIN, TIBC, IRON, RETICCTPCT in the last 72 hours. Sepsis Labs: No results for input(s): PROCALCITON, LATICACIDVEN in the last 168 hours.  No results found for this or any previous visit (from the past 240 hour(s)).       Radiology Studies: No results found.      Scheduled Meds: . amoxicillin-clavulanate  1 tablet Oral Q12H  . apixaban  5 mg Per Tube BID  . feeding supplement (PRO-STAT SUGAR FREE 64)  30 mL Per Tube Daily  . folic acid  1 mg Per Tube Daily  . free water  150 mL Per Tube Q4H  . pantoprazole sodium  40 mg Per Tube BID  . sodium chloride flush  3 mL Intravenous Q12H  . thiamine  100 mg Per Tube Daily   Continuous Infusions: . sodium chloride 250 mL (08/19/19 1058)  . feeding supplement (OSMOLITE 1.5 CAL) 1,000 mL (08/24/19 1012)     LOS: 27 days    Time spent: 35 minutes    Irine Seal, MD [facility]  If 7PM-7AM,  please contact night-coverage [email or web address] 08/24/2019, 11:36 AM

## 2019-08-24 NOTE — Progress Notes (Signed)
Physical Therapy Treatment Patient Details Name: George Wade. MRN: [id number] DOB: [birth year] Today's Date: 08/24/2019    History of Present Illness Pt admitted for chest pain and now diagnosed with alcohol induced mood disorder. History includes alcohol abuse and HTN. Hospital stay complicated by transfer to CCU secondary to respiratory distress on 07/31/19 and then PEG tube placement on 10/26 secondary to severe esophageal stricture. Pt now pulled out PEG tube and has been agitated with RN staff. Pt is now s/p fall out of bed on 11/3. Pt re-evaluated this date as received new PEG tube on 11/7 and is less impulsive.    PT Comments    Pt is making great progress towards goals with improved 5 time sit<>Stand in 11 seconds demonstrating normal strength/power and decreased falls risk. Pt able to perform ambulation in multiple directions and also challenged with stair training. Pt continues to be motivated to participate, however anxious about unknown discharge. Active listening provided to patient.   Follow Up Recommendations  Supervision for mobility/OOB;Outpatient PT     Equipment Recommendations  Rolling walker with 5" wheels    Recommendations for Other Services       Precautions / Restrictions Precautions Precautions: Fall Restrictions Weight Bearing Restrictions: No    Mobility  Bed Mobility Overal bed mobility: Independent             General bed mobility comments: safe technique with no issues  Transfers Overall transfer level: Modified independent Equipment used: Rolling walker (2 wheeled) Transfers: Sit to/from Stand Sit to Stand: Modified independent (Device/Increase time)         General transfer comment: safe and upright posture  Ambulation/Gait Ambulation/Gait assistance: Supervision Gait Distance (Feet): 400 Feet Assistive device: Rolling walker (2 wheeled) Gait Pattern/deviations: Step-through pattern     General Gait Details: ambulated  in hallway with RW with reciprocal gait pattern and safe technique. Good speed and stride.    Stairs Stairs: Yes Stairs assistance: Min guard Stair Management: One rail Right;Step to pattern Number of Stairs: 13 General stair comments: navigated up/down 13 steps with B hand support on R rail with step to gait pattern. Safe technique   Wheelchair Mobility    Modified Rankin (Stroke Patients Only)       Balance Overall balance assessment: Needs assistance;History of Falls Sitting-balance support: Feet supported Sitting balance-Leahy Scale: Good     Standing balance support: Bilateral upper extremity supported Standing balance-Leahy Scale: Good                              Cognition Arousal/Alertness: Awake/alert Behavior During Therapy: WFL for tasks assessed/performed Overall Cognitive Status: Within Functional Limits for tasks assessed                                        Exercises Other Exercises Other Exercises: seated ther-ex performed including alt. marching, alt. LE extension with arm raise, and hip add squeezes. all x 20 reps with supervision Other Exercises: Performed 5 time sit<>stand in 11 seconds this date. Other Exercises: ambulated 2 x 40' sideways holding onto railing in hallway as well as 2x 40' in post direction. Decreased step length noted and pt needs cues for confidence.    General Comments        Pertinent Vitals/Pain Pain Assessment: No/denies pain    Home Living  Prior Function            PT Goals (current goals can now be found in the care plan section) Acute Rehab PT Goals Patient Stated Goal: to get stronger PT Goal Formulation: With patient Time For Goal Achievement: 09/04/19 Potential to Achieve Goals: Good Progress towards PT goals: Progressing toward goals    Frequency    Min 2X/week      PT Plan Current plan remains appropriate    Co-evaluation               AM-PAC PT "6 Clicks" Mobility   Outcome Measure  Help needed turning from your back to your side while in a flat bed without using bedrails?: None Help needed moving from lying on your back to sitting on the side of a flat bed without using bedrails?: None Help needed moving to and from a bed to a chair (including a wheelchair)?: None Help needed standing up from a chair using your arms (e.g., wheelchair or bedside chair)?: None Help needed to walk in hospital room?: None Help needed climbing 3-5 steps with a railing? : A Little 6 Click Score: 23    End of Session Equipment Utilized During Treatment: Gait belt Activity Tolerance: Patient tolerated treatment well Patient left: in bed;with bed alarm set Nurse Communication: Mobility status PT Visit Diagnosis: Muscle weakness (generalized) (M62.81);Difficulty in walking, not elsewhere classified (R26.2);History of falling (Z91.81);Pain;Unsteadiness on feet (R26.81) Pain - Right/Left: Right     Time: 8502-7741 PT Time Calculation (min) (ACUTE ONLY): 29 min  Charges:  $Gait Training: 8-22 mins $Therapeutic Exercise: 8-22 mins                     Greggory Stallion, PT, DPT [phone]    Roddy Bellamy 08/24/2019, 2:54 PM

## 2019-08-24 NOTE — TOC Progression Note (Addendum)
Transition of Care (TOC) - Progression Note    Patient Details  Name: George Wade. MRN: [id number] Date of Birth: 26-Nov-1955  Transition of Care [facility]) CM/SW Contact  Candie Chroman, LCSW Phone Number: 08/24/2019, 8:30 AM  Clinical Narrative:  Spoke with assistant director of [facility] dept late yesterday. Patient will be able to discharge without home health as long as he gets the needed education and switches to bolus feeds. Will need LOG for tube feed formulas. Discussed with Carolynn Sayers with [facility]. Will update her once we know what he will discharge on and she will coordinate with [facility] if needed.  10:52 am: CSW met with patient to update. He has left 4 messages at [employer] with no call back. CSW left voicemail late yesterday and this morning. Discussed possibility of going to the hotel for 1-2 weeks. Discussed Medicare, Medicaid, and disability. Emailed Development worker, community and asked her to call patient and provide more information on this.   4:14 pm: CSW provided patient with an update. [facility] is unsure if they will be able to supply tube feed formula but are checking with supervisor. Patient is requesting a psych consult so he can talk to someone. MD is aware. He would like for them to call before they come in so he can prepare himself to discuss his life.      Expected Discharge Plan and Services                                                 Social Determinants of Health (SDOH) Interventions    Readmission Risk Interventions No flowsheet data found.

## 2019-08-24 NOTE — Progress Notes (Signed)
Nutrition Follow-up  DOCUMENTATION CODES:   Non-severe (moderate) malnutrition in context of social or environmental circumstances  INTERVENTION:  Recommend Bolus feedings as tolerated; 2 cartons Osmolite 1.5 daily TID (1440 ml/day) -906 ml H2O daily in free water flushes  Provides 2160 kcal,  90 grams protein, 2000 ml free water  NUTRITION DIAGNOSIS:   Moderate Malnutrition related to social / environmental circumstances(EtOH abuse, limited access to well-balanced meals) as evidenced by mild fat depletion, moderate fat depletion, mild muscle depletion, moderate muscle depletion.  ongoing  GOAL:   Patient will meet greater than or equal to 90% of their needs Met with tube feedings  MONITOR:   Labs, Weight trends, TF tolerance, Skin, I & O's  REASON FOR ASSESSMENT:   Consult Assessment of nutrition requirement/status  ASSESSMENT:   63 year old male with PMHx of HTN, hx non-Hodgkin's lymphoma, hx tonsillar cancer, EtOH abuse admitted with small right lung pulmonary emboli, chronic iron deficiency anemia, dysphagia.  Pt s/p EGD 10/23 found to have benign appearing esophageal stenosis which was dilated. Scope could not be passed beyond stricture.  Pt s/p IR replacement of 16 Fr balloon retention gastrostomy tube 11/6  Pt chart review, pt likely unable to go to SNF as he is homeless; possibility of going to extended stay. Patient able to discharge without home health on bolus feedings. Patient reports feeling down today and stated that he is hungry. Patient accepting of transitioning to bolus feedings, he remained engaged during education and able to understand and perform steps himself. RD discussed increasing feeding volumes gradually, to goal of 6 cartons/day and educated on additional water flushes needed outside of feedings.   Diet Order:   Diet Order            Diet NPO time specified Except for: Ice Chips  Diet effective now              EDUCATION NEEDS:   No  education needs have been identified at this time  Skin:  Skin Assessment: Reviewed RN Assessment  Last BM:  11/10- type 7  Height:   Ht Readings from Last 1 Encounters:  08/17/19 _0  (1.727 m)    Weight:   Wt Readings from Last 1 Encounters:  08/24/19 67.9 kg    Ideal Body Weight:  70 kg  BMI:  Body mass index is 22.78 kg/m.  Estimated Nutritional Needs:   Kcal:  1800-2000  Protein:  90-100 grams  Fluid:  1.8-2 L/day   Lajuan Lines, RD, LDN Clinical Nutrition Office [phone] After Hours/Weekend Pager: [phone]

## 2019-08-25 DIAGNOSIS — F10229 Alcohol dependence with intoxication, unspecified: Secondary | ICD-10-CM

## 2019-08-25 DIAGNOSIS — F1024 Alcohol dependence with alcohol-induced mood disorder: Secondary | ICD-10-CM

## 2019-08-25 MED ORDER — OSMOLITE 1.5 CAL PO LIQD
474.0000 mL | Freq: Three times a day (TID) | ORAL | Status: DC
  Administered 2019-08-25: 16:00:00 474 mL
  Administered 2019-08-25: 100 mL
  Administered 2019-08-25: 200 mL
  Administered 2019-08-26: 300 mL
  Administered 2019-08-26: 350 mL
  Administered 2019-08-26: 250 mL
  Administered 2019-08-27: 400 mL
  Administered 2019-08-27: 474 mL
  Administered 2019-08-27: 237 mL
  Administered 2019-08-28 (×2): 474 mL
  Administered 2019-08-28: 200 mL
  Administered 2019-08-29: 474 mL

## 2019-08-25 NOTE — Progress Notes (Signed)
PROGRESS NOTE    George Wade.  IOX:[id number] DOB: 1956-01-08 DOA: 07/28/2019 PCP: Patient, [name] Per    Brief Narrative:  [name] a62 y.o.malewith a known history of new medical problems as below. The patient presents the ED with above chief complaints.The patient presents the ED with above chief complaints. He has had chest pain in the central area, which is extirpated by cough and swallowing food. He also complains of shortness breath and cough. He has been drinking alcohol on daily basis. The last drink was yesterday. He denies any tremors or shaking. He is found elevated d-dimer and CT angiogram of the chest report right-sided PE. [name] requestedadmission and willstart heparin drip.  Interim History: -PEG tube is placed by [name] from IR 11/6 -X-ray of abdomen showed PEG tube position [facility] 11/7 -11/8: start tube feeding, pt has nausea and vomited several times with nonbiliary nonbloody vomitus.  No diarrhea or abdominal pain. -11/9: pt had rectal bleeding, GI, [name] of GI, [name] of Surgery and [name] of Oncology were consulted. Pt was known by 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 No indication for biopsy at this time. he should be referred to a colorectal surgeon for evaluation of this. In September he preferred [facility].  -11/9: Hgb 9.9 -->11.0 -->11.1 - 11/10: Mg 2.2, K 4.1, and phosphorus 3.8    Assessment & Plan:   Principal Problem:   Alcohol-induced mood disorder (HCC) Active Problems:   Wernicke's encephalopathy   Alcohol dependence with uncomplicated intoxication ([place])   Pulmonary emboli (HCC)   Nausea & vomiting   Malnutrition of moderate degree   Problems with swallowing and mastication   Stricture and stenosis of esophagus   Fall   Elevated LFTs   Esophageal stricture   Aspiration into airway   PEG (percutaneous  endoscopic gastrostomy) status (HCC)   Rectal bleeding   Rectal mass   Anal condylomata  1 rectal bleeding and 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  Patient was seen by GI, [name] as well as [name] of general surgery and [name] of oncology who will follow the patient.  Patient well-known to [name] as patient was seen in the clinic September 2020.  Patient noted to have a large anal mass which appeared most consistent with anogenital condylomata.  There was no indication for biopsy at this time and per general surgery patient needs to be referred to a colorectal surgeon for further evaluation.  It was noted that patient was initially referred to [facility] in September however did not make the appointment.  Hemoglobin currently stable.  Patient on Eliquis.  Will need referral to colorectal surgery post discharge for further evaluation.  Patient could likely follow-up with general surgeon in the outpatient setting who could arrange the referral.  Appreciate general surgery, GI and oncology's input and recommendations.  2.  Acute hypoxic and hypercapnic respiratory failure due to aspiration pneumonia Resolved.  Patient currently on room air with good sats.  Patient speaking in full sentences.  Patient currently receiving PEG tube feedings and is currently n.p.o. except for ice chips.  Status post IV Unasyn.  Continue duo nebs.  Follow.  3.  Dysphagia/benign-appearing esophageal stenosis status post dilatation/status post PEG tube placement by IR x2 Patient noted to have severe aspiration episodes.  Patient was seen by GI underwent upper endoscopy 08/02/2019 that showed a benign-appearing esophageal stenosis  status post dilation.  Patient underwent PEG tube placement by IR on 08/06/2019.  Patient pulled PEG tube on 08/10/2019 in the evening IR reconsulted for PEG tube placement.  Initially per [name] patient just had a PEG tube placed on 1026 and patient pulled out the tube and  was felt at that time not appropriate and risky to place PEG tube again.  Patient started on PEG tube feeds.  Patient's mental status improved on 08/16/2019 and patient promised Dr.Niu that he would not pull out PEG tube if it was placed again.  IR reconsulted and PEG tube placed on 08/17/2019.  Plain films done showed PEG tube in good position.  Tube feeds started on 08/18/2019.  Currently tolerating tube feeds.  May need to be transition to bolus tube feeds if patient unable to go to a skilled nursing facility.  Consult with dietitian for bolus feeds.  Follow.  4.  Moderate protein calorie malnutrition Nutritionist consulted.  Patient on tube feeds via PEG tube.  Dietitian consulted and patient being transitioned to bolus feeds as patient unable to go to a skilled nursing facility.  Patient currently homeless.  Continue bolus feeds and follow.   5.  Small right lung pulmonary emboli Patient was on Lovenox which has been discontinued.  Patient started on Eliquis on 08/18/2019.  Outpatient follow-up.  6.  Chronic iron deficiency anemia Status post IV iron 07/29/2019.  Currently stable at 11.3 as of 08/23/2019.  Repeat labs in 48 hours..  Follow.  7.  History of DTs with history of alcohol abuse/alcohol induced mood disorder/Warnicke's encephalopathy Resolved.  Patient was placed on the Precedex drip which has been subsequently weaned off.  Patient transferred out of the ICU.  Patient was seen in consultation by psychiatry for psychosis and patient was reevaluated secondary to behavioral nonadherence versus delirium.  Psychiatry wrote for Haldol as needed on initial consultation.  Continue thiamine, folic acid, multivitamin.  Follow.  8.  Tobacco use Stable.  Nicotine patch.  9.  Elevated LFTs Likely secondary to alcohol use.  Right upper quadrant ultrasound with hepatic steatosis.  Outpatient follow-up.  53.  Fall with lip laceration Patient noted to have a lip laceration from injury with small  tooth fragments in the depths of the wound.  [name] from ENT was consulted.  Patient was offered option of numbing the area and probing the depths of the wound to see if might locate small dental fragment and possibly placing couple of loose stitches to better align his flap of tissue but patient declined.  Patient started on Augmentin 08/15/2019 transitioned to Unasyn and back to Augmentin will need to complete a 10-day course of antibiotic treatment.  11.  Left hand cellulitis Resolved.  Felt secondary to bug bite.  Patient seen by orthopedic physician.  No evidence of deeper infection that would require I&D.  Patient received IV Unasyn and currently on Augmentin.  DVT prophylaxis: Eliquis Code Status: Full Family Communication: Updated patient.  No family at bedside. Disposition Plan: To be determined.  Likely SNF if patient will be accepted into SNF as patient currently homeless and likely unsafe and needs to be in a environment where he could be monitored closely and PEG tube monitored with tube feeds.   Consultants:   Oncology: [name] 08/20/2019  General surgery: [name] 08/20/2019  Gastroenterology: Napoleon Form 08/20/2019  Palliative care: Asencion Gowda, NP 08/16/2019  ENT surgery: [name] 08/15/2019  Psychiatry: Waylan Boga, NP 08/11/2019  Orthopedics: Dr. Rudene Christians 08/05/2019  PCCM: Marda Stalker, NP  Gastroenterology: Dr. Bonna Gains 07/29/2019  Procedures:   PEG tube placement 08/17/2019  CT angiogram chest 07/28/2019  CT head CT maxillofacial 08/14/2019  Abdominal films 08/08/2019 08/18/2019  EGD with benign esophageal stenosis with dilatation per Dr. Allen Norris 08/02/2019  Abdominal ultrasound 08/01/2019  Antimicrobials:   Augmentin 08/17/2019>>>> 08/23/2019  IV Ancef 08/06/2019>>>> 08/07/2019  IV Unasyn 07/31/2019>>>>> 08/09/2019   Subjective: Patient sitting up in bed.  Denies any shortness of breath.  Complaints of upper abdominal lower chest pain ongoing  prior to admission and chronic.  Complaint of anorectal lesion.   Objective: Vitals:   08/24/19 1542 08/24/19 2000 08/25/19 0436 08/25/19 0846  BP: 100/78 (!) 113/98 101/79 (!) 113/91  Pulse: 91 92 78 90  Resp:  20 20   Temp: 98.4 F (36.9 C) 98.5 F (36.9 C) (!) 97.5 F (36.4 C)   TempSrc: Oral Oral Oral   SpO2: 96% 93% 97% 99%  Weight: 66.3 kg  66.8 kg   Height:        Intake/Output Summary (Last 24 hours) at 08/25/2019 1151 Last data filed at 08/25/2019 0812 Gross per 24 hour  Intake -  Output 1375 ml  Net -1375 ml   Filed Weights   08/24/19 0501 08/24/19 1542 08/25/19 0436  Weight: 67.9 kg 66.3 kg 66.8 kg    Examination:  General exam: NAD Respiratory system: CTAB.  No wheezes, no crackles, no rhonchi.  Normal respiratory effort.  Cardiovascular system: Regular rate rhythm no murmurs rubs or gallops.  No JVD.  No lower extremity edema.  Gastrointestinal system: Abdomen is soft, nontender, nondistended, positive bowel sounds.  PEG tube intact.  Central nervous system: Alert and oriented. No focal neurological deficits. Extremities: Symmetric 5 x 5 power. Skin: No rashes, lesions or ulcers Psychiatry: Judgement and insight appear normal. Mood & affect appropriate.     Data Reviewed: I have personally reviewed following labs and imaging studies  CBC: Recent Labs  Lab 08/20/19 0405 08/20/19 1107 08/20/19 1724 08/21/19 0545 08/22/19 0534 08/23/19 0603  WBC 4.6 7.9 7.5 4.5 4.2  --   HGB 9.9* 11.0* 11.1* 9.7* 10.2* 11.3*  HCT 34.6* 35.5* 37.6* 33.6* 33.1* 36.8*  MCV 87.2 81.8 85.8 86.8 81.9  --   PLT 319 356 354 271 264  --    Basic Metabolic Panel: Recent Labs  Lab 08/19/19 0554 08/20/19 0405 08/21/19 0545 08/22/19 0534 08/23/19 0603  NA 137 141 140 139 137  K 3.4* 4.1 4.1 3.9 4.1  CL 106 109 106 105 102  CO2 23 26 28 29 27   GLUCOSE 124* 95 95 133* 132*  BUN <5* 5* 9 11 9   CREATININE 0.44* 0.50* 0.54* 0.53* 0.40*  CALCIUM 8.3* 8.3* 8.5* 8.5*  8.9  MG 1.7 2.5* 2.2 1.9  --   PHOS 3.1 3.3 3.8 4.6  --    GFR: Estimated Creatinine Clearance: 89.3 mL/min (A) (by C-G formula based on SCr of 0.4 mg/dL (L)). Liver Function Tests: No results for input(s): AST, ALT, ALKPHOS, BILITOT, PROT, ALBUMIN in the last 168 hours. No results for input(s): LIPASE, AMYLASE in the last 168 hours. No results for input(s): AMMONIA in the last 168 hours. Coagulation Profile: No results for input(s): INR, PROTIME in the last 168 hours. Cardiac Enzymes: No results for input(s): CKTOTAL, CKMB, CKMBINDEX, TROPONINI in the last 168 hours. BNP (last 3 results) No results for input(s): PROBNP in the last 8760 hours. HbA1C: No results for input(s): HGBA1C in the last 72  hours. CBG: Recent Labs  Lab 08/21/19 2119  GLUCAP 85   Lipid Profile: No results for input(s): CHOL, HDL, LDLCALC, TRIG, CHOLHDL, LDLDIRECT in the last 72 hours. Thyroid Function Tests: No results for input(s): TSH, T4TOTAL, FREET4, T3FREE, THYROIDAB in the last 72 hours. Anemia Panel: No results for input(s): VITAMINB12, FOLATE, FERRITIN, TIBC, IRON, RETICCTPCT in the last 72 hours. Sepsis Labs: No results for input(s): PROCALCITON, LATICACIDVEN in the last 168 hours.  No results found for this or any previous visit (from the past 240 hour(s)).       Radiology Studies: No results found.      Scheduled Meds: . amoxicillin-clavulanate  1 tablet Oral Q12H  . apixaban  5 mg Per Tube BID  . feeding supplement (OSMOLITE 1.5 CAL)  474 mL Per Tube TID  . folic acid  1 mg Per Tube Daily  . free water  150 mL Per Tube Q4H  . pantoprazole sodium  40 mg Per Tube BID  . senna-docusate  1 tablet Oral BID  . sodium chloride flush  3 mL Intravenous Q12H  . thiamine  100 mg Per Tube Daily   Continuous Infusions: . sodium chloride 250 mL (08/19/19 1058)     LOS: 28 days    Time spent: 35 minutes    Irine Seal, MD [facility]  If 7PM-7AM, please contact  night-coverage [email or web address] 08/25/2019, 11:51 AM

## 2019-08-25 NOTE — Consult Note (Addendum)
[place] [facility] Consult   Reason for Consult:  Patient requested Referring Physician:  Dr Grandville Silos Patient Identification: George Wade. MRN:  [id number] Principal Diagnosis: Alcohol-induced mood disorder ([place]) Diagnosis:  Principal Problem:   Alcohol-induced mood disorder (HCC) Active Problems:   Wernicke's encephalopathy   Alcohol dependence with uncomplicated intoxication ([place])   Pulmonary emboli (HCC)   Nausea & vomiting   Malnutrition of moderate degree   Problems with swallowing and mastication   Stricture and stenosis of esophagus   Fall   Elevated LFTs   Esophageal stricture   Aspiration into airway   PEG (percutaneous endoscopic gastrostomy) status (HCC)   Rectal bleeding   Rectal mass   Anal condylomata  Total Time spent with patient: 1 hour  Subjective:   George Wade. is a 63 y.o. male patient admitted with PE.  "I wanted someone to talk to."  Patient seen and evaluated in person by this provider.  He reports he wanted to talk because he was feeling "blah".  He reports no one in his family has been to visit him even though he has been in the hospital for like a month.  Reports he got a new phone prior to admission without his contact number and contacted his youngest son who he reports has good relationship with to get numbers from him with no success.  He has been is living in his car for a long time prior to admission.  Prior to assessment he had his laptop computer out and working on it.  States that he bought this prior to coming to the hospital.  When discussing medications for depression he reports no medicines have helped him and went through a long list of medications.  He states they hurt him more than helped him.  Discussed mood stabilizers which he did not seem to want to entertain.  When discussed going to therapy he was interested states he had done it briefly in the past along with AA.  Interested in [place] at this time resources.  Denies  suicidal/homicidal ideations, hallucinations, and withdrawal symptoms.  Patient is very gamy  HPI per MD:  George Wade. is a 63 y.o. male patient complains of cough and chest pain for 2 weeks.  Cough started first and then the chest pain afterwards.  Told triage that the chest pain is constant the center of his chest.  He said it radiates down his left arm.  Patient tells me he has no chest pain in his chest unless he swallows any pain from the bottom of his neck down into the sternum.  It is only when he swallows and he when he swallows anything liquids or solids.  It does not hurt when he coughs.  He does not have chest pain otherwise.  He is not short of breath.  He says the cough is productive of small amounts of clear phlegm.  He does not think he has been running a fever.  Patient has been homeless and living in his car.  Past Psychiatric History: alcohol dependence, depression, anxiety  Risk to Self:  none Risk to Others:  none Prior Inpatient Therapy:  multiple detoxs Prior Outpatient Therapy:  yes  Past Medical History:  Past Medical History:  Diagnosis Date  . 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 ED (erectile dysfunction)   . Fatigue   . History of pneumonia   . Hypercholesteremia   . Hypertension   . Lymphoma (  [place]) 2008   non hodgkins lymphoma  . Rectal bleeding   . 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 ESOPHAGOGASTRODUODENOSCOPY N/A 05/18/2019   Procedure: ESOPHAGOGASTRODUODENOSCOPY (EGD);  Surgeon: Lin Landsman, MD;  Location: [facility] ENDOSCOPY;  Service: Gastroenterology;  Laterality: N/A;  . ESOPHAGOGASTRODUODENOSCOPY (EGD) WITH PROPOFOL N/A 08/02/2019   Procedure: ESOPHAGOGASTRODUODENOSCOPY (EGD) WITH PROPOFOL;  Surgeon: [name], MD;  Location: [facility] ENDOSCOPY;  Service: Endoscopy;  Laterality: N/A;  . [place]  . IR GASTROSTOMY  TUBE MOD SED  08/06/2019  . IR GASTROSTOMY TUBE MOD SED  08/17/2019  . JOINT REPLACEMENT    . REPLACEMENT TOTAL KNEE BILATERAL  2009  . TONSILLECTOMY  2008   with neck dissection    Family History:  Family History  Problem Relation Age of Onset  . Cancer Mother   . Hypertension Father   . Cancer Paternal Grandmother    Family Psychiatric  History: none Social History:  Social History   Substance and Sexual Activity  Alcohol Use Yes  . Alcohol/week: 5.0 standard drinks  . Types: 5 Glasses of wine per week  . Frequency: Never     Social History   Substance and Sexual Activity  Drug Use No    Social History   Socioeconomic History  . Marital status: Divorced    Spouse name: Not on file  . Number of children: Not on file  . Years of education: Not on file  . Highest education level: Not on file  Occupational History  . Not on file  Social Needs  . Financial resource strain: Not on file  . Food insecurity    Worry: Not on file    Inability: Not on file  . Transportation needs    Medical: Not on file    Non-medical: Not on file  Tobacco Use  . Smoking status: Never Smoker  . Smokeless tobacco: Never Used  Substance and Sexual Activity  . Alcohol use: Yes    Alcohol/week: 5.0 standard drinks    Types: 5 Glasses of wine per week    Frequency: Never  . Drug use: No  . Sexual activity: Not on file  Lifestyle  . Physical activity    Days per week: Not on file    Minutes per session: Not on file  . Stress: Not on file  Relationships  . Social Herbalist on phone: Not on file    Gets together: Not on file    Attends religious service: Not on file    Active member of club or organization: Not on file    Attends meetings of clubs or organizations: Not on file    Relationship status: Not on file  Other Topics Concern  . Not on file  Social History Narrative  . Not on file   Additional Social History:    Allergies:   Allergies  Allergen  Reactions  . Sulfa Antibiotics Nausea Only    Labs:  No results found for this or any previous visit (from the past 48 hour(s)).  Current Facility-Administered Medications  Medication Dose Route Frequency Provider Last Rate Last Dose  . 0.9 %  sodium chloride infusion  250 mL Intravenous PRN Demetrios Loll, MD 10 mL/hr at 08/19/19 1058 250 mL at 08/19/19 1058  . acetaminophen (TYLENOL) tablet 650 mg  650 mg Oral Q6H PRN Demetrios Loll, MD  Or  . acetaminophen (TYLENOL) suppository 650 mg  650 mg Rectal Q6H PRN Demetrios Loll, MD      . albuterol (PROVENTIL) (2.5 MG/3ML) 0.083% nebulizer solution 2.5 mg  2.5 mg Nebulization Q2H PRN Demetrios Loll, MD   2.5 mg at 08/04/19 1012  . amoxicillin-clavulanate (AUGMENTIN) 875-125 MG per tablet 1 tablet  1 tablet Oral Q12H Oswald Hillock, RPH   1 tablet at 08/25/19 4431  . apixaban (ELIQUIS) tablet 5 mg  5 mg Per Tube BID Lu Duffel, RPH   5 mg at 08/25/19 5400  . bisacodyl (DULCOLAX) EC tablet 5 mg  5 mg Oral Daily PRN Demetrios Loll, MD      . feeding supplement (OSMOLITE 1.5 CAL) liquid 474 mL  474 mL Per Tube TID Eugenie Filler, MD 150 mL/hr at 08/25/19 1605 474 mL at 08/25/19 1605  . folic acid (FOLVITE) tablet 1 mg  1 mg Per Tube Daily Spongberg, Audie Pinto, MD   1 mg at 08/25/19 0829  . free water 150 mL  150 mL Per Tube Q4H Lang Snow, NP   150 mL at 08/25/19 1617  . guaiFENesin-dextromethorphan (ROBITUSSIN DM) 100-10 MG/5ML syrup 5 mL  5 mL Oral Q4H PRN Lance Coon, MD      . haloperidol lactate (HALDOL) injection 2 mg  2 mg Intravenous Q6H PRN Marcell Anger, MD   2 mg at 08/21/19 2329  . HYDROcodone-acetaminophen (NORCO/VICODIN) 5-325 MG per tablet 1-2 tablet  1-2 tablet Oral Q4H PRN Arne Cleveland, MD   2 tablet at 08/25/19 0830  . hydrocortisone cream 1 %   Topical Q6H PRN Ivor Costa, MD      . HYDROmorphone (DILAUDID) injection 1 mg  1 mg Intravenous Q2H PRN Arne Cleveland, MD   1 mg at 08/25/19 1605  .  iodixanol (VISIPAQUE) 320 MG/ML injection 50 mL  50 mL Other Once PRN Arne Cleveland, MD      . metoprolol tartrate (LOPRESSOR) injection 5 mg  5 mg Intravenous Q6H PRN Lance Coon, MD   5 mg at 07/30/19 2314  . morphine 2 MG/ML injection 1 mg  1 mg Intravenous Q4H PRN Ivor Costa, MD   1 mg at 08/20/19 2302  . ondansetron (ZOFRAN) tablet 4 mg  4 mg Oral Q6H PRN Demetrios Loll, MD       Or  . ondansetron [facility]) injection 4 mg  4 mg Intravenous Q6H PRN Demetrios Loll, MD   4 mg at 08/20/19 0836  . pantoprazole sodium (PROTONIX) 40 mg/20 mL oral suspension 40 mg  40 mg Per Tube BID Ivor Costa, MD   40 mg at 08/25/19 0843  . polyvinyl alcohol (LIQUIFILM TEARS) 1.4 % ophthalmic solution 1 drop  1 drop Both Eyes PRN Ivor Costa, MD   1 drop at 08/19/19 2153  . promethazine (PHENERGAN) injection 25 mg  25 mg Intravenous Q6H PRN Bettey Costa, MD   25 mg at 08/08/19 0934  . senna-docusate (Senokot-S) tablet 1 tablet  1 tablet Oral QHS PRN Demetrios Loll, MD      . senna-docusate (Senokot-S) tablet 1 tablet  1 tablet Oral BID Eugenie Filler, MD   1 tablet at 08/25/19 0830  . sodium chloride flush (NS) 0.9 % injection 3 mL  3 mL Intravenous Q12H Demetrios Loll, MD   3 mL at 08/25/19 0831  . sodium chloride flush (NS) 0.9 % injection 3 mL  3 mL Intravenous PRN Demetrios Loll, MD      .  thiamine (VITAMIN B-1) tablet 100 mg  100 mg Per Tube Daily Spongberg, Audie Pinto, MD   100 mg at 08/25/19 0830    Musculoskeletal: Strength & Muscle Tone: decreased Gait & Station: did not witness Patient leans: N/A  Psychiatric Specialty Exam: Physical Exam  Nursing note and vitals reviewed. Constitutional: He is oriented to person, place, and time. He appears well-developed and well-nourished.  HENT:  Head: Normocephalic.  Neck: Normal range of motion.  Respiratory: Effort normal.  Musculoskeletal: Normal range of motion.  Neurological: He is alert and oriented to person, place, and time.  Psychiatric: His speech is normal  and behavior is normal. Judgment and thought content normal. His affect is blunt. Cognition and memory are normal. He exhibits a depressed mood.    Review of Systems  Constitutional: Negative.   HENT: Negative.   Eyes: Negative.   Respiratory: Negative.   Cardiovascular: Negative.   Gastrointestinal: Negative.   Genitourinary: Negative.   Musculoskeletal: Negative.   Skin: Negative.   Neurological: Negative.   Endo/Heme/Allergies: Negative.   Psychiatric/Behavioral: Positive for depression and substance abuse.  All other systems reviewed and are negative.   Blood pressure (!) 132/106, pulse 97, temperature 98.6 F (37 C), temperature source Oral, resp. rate 16, height 5\' 8"  (1.727 m), weight 66.8 kg, SpO2 99 %.Body mass index is 22.38 kg/m.  General Appearance: Casual  Eye Contact:  Good   Speech:  Normal Rate  Volume: Normal  Mood: Depressed mild  Affect:  Congruent  Thought Process: Logical  Orientation: Alert and oriented x3  Thought Content: WDL  Suicidal Thoughts:  No  Homicidal Thoughts:  No  Memory:  Immediate;   Fair Recent;   Fair Remote;   Fair  Judgement: Fair  Insight: Fair  Psychomotor Activity: Decreased  Concentration: Fair  Recall: [employer] of Knowledge:  Fair  Language:  Good  Akathisia:  No  Handed:  Right  AIMS (if indicated):     Assets:  Leisure Time Resilience  ADL's:  Intact  Cognition: Impaired  Sleep:      63 year old male who was admitted for medical concerns and alcohol dependency.  Long hospital course with some complications, recovering at this time.  Reports feeling low level of depression today related to poor social support.  Agreeable for outpatient therapy along with going to AA.  No suicidal/homicidal ideations, hallucinations, or withdrawal symptoms.  Treatment Plan Summary: Adjustment disorder order with anxious mood: -Recommend [facility] after discharge for therapy -Declined medications at this time  Alcohol use  disorder: -Recommend AA, resources provided  Disposition: Does not warrant psychiatric admission  Waylan Boga, NP 08/25/2019 4:36 PM   Case discussed and plan agreed upon as outlined above.

## 2019-08-25 NOTE — Progress Notes (Signed)
Patient has been alert and oriented x4 this shift. Pt is tolerating progressive bolus feedings without any difficulties. Pt is progressing with learning peg tube self-care and feeding administration techniques. Pt continues to complain of pain in buttocks and lower back, 8-10 on pain scale. Pt is receiving IV dilaudid for pain management.

## 2019-08-26 LAB — CBC
HCT: 36 % — ABNORMAL LOW (ref 39.0–52.0)
Hemoglobin: 10.7 g/dL — ABNORMAL LOW (ref 13.0–17.0)
MCH: 25.2 pg — ABNORMAL LOW (ref 26.0–34.0)
MCHC: 29.7 g/dL — ABNORMAL LOW (ref 30.0–36.0)
MCV: 84.9 fL (ref 80.0–100.0)
Platelets: 232 10*3/uL (ref 150–400)
RBC: 4.24 MIL/uL (ref 4.22–5.81)
RDW: 23.3 % — ABNORMAL HIGH (ref 11.5–15.5)
WBC: 6.1 10*3/uL (ref 4.0–10.5)
nRBC: 0 % (ref 0.0–0.2)

## 2019-08-26 LAB — CREATININE, SERUM
Creatinine, Ser: 0.45 mg/dL — ABNORMAL LOW (ref 0.61–1.24)
GFR calc Af Amer: >60 mL/min (ref >60)
GFR calc non Af Amer: >60 mL/min (ref >60)

## 2019-08-26 NOTE — Progress Notes (Signed)
Patient given bolus feed at this time.  Able to tolerate 267ml without c/o nausea.

## 2019-08-26 NOTE — Progress Notes (Signed)
   08/26/19 2000  Clinical Encounter Type  Visited With Patient;Health care provider  Visit Type Follow-up  Referral From [place] followed up with the patient from earlier visit. Per the patient's request, this chaplain phoned the patient's son to request assistance with locating important phone numbers and left a message. Upon arrival, the patient was using his laptop computer and appeared to be in a better mood. The patient reported that he was feeling lighter after our earlier conversation and that he was appreciative for the visit and spiritual support provided. This chaplain encouraged the patient to follow up with scheduling his self-delayed GI appointment/procedure. Patient expressed understanding and plans to follow through discussed.  Follow up recommendations: Continue to check in with the patient to provide a space for him to express and process his thoughts and feelings.

## 2019-08-26 NOTE — Progress Notes (Signed)
PROGRESS NOTE    George Wade.  EAV:[id number] DOB: 1956-04-11 DOA: 07/28/2019 PCP: Patient, [name] Per    Brief Narrative:  George Wade a62 y.o.malewith a known history of new medical problems as below. The patient presents the ED with above chief complaints.The patient presents the ED with above chief complaints. He has had chest pain in the central area, which is extirpated by cough and swallowing food. He also complains of shortness breath and cough. He has been drinking alcohol on daily basis. The last drink was yesterday. He denies any tremors or shaking. He is found elevated d-dimer and CT angiogram of the chest report right-sided PE. [name] requestedadmission and willstart heparin drip.  Interim History: -PEG tube is placed by [name] from IR 11/6 -X-ray of abdomen showed PEG tube position [facility] 11/7 -11/8: start tube feeding, pt has nausea and vomited several times with nonbiliary nonbloody vomitus.  No diarrhea or abdominal pain. -11/9: pt had rectal bleeding, GI, [name] of GI, [name] of Surgery and [name] of Oncology were consulted. Pt was known by 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 No indication for biopsy at this time. he should be referred to a colorectal surgeon for evaluation of this. In September he preferred [facility].  -11/9: Hgb 9.9 -->11.0 -->11.1 - 11/10: Mg 2.2, K 4.1, and phosphorus 3.8    Assessment & Plan:   Principal Problem:   Alcohol-induced mood disorder (HCC) Active Problems:   Wernicke's encephalopathy   Alcohol dependence with uncomplicated intoxication ([place])   Pulmonary emboli (HCC)   Nausea & vomiting   Malnutrition of moderate degree   Problems with swallowing and mastication   Stricture and stenosis of esophagus   Fall   Elevated LFTs   Esophageal stricture   Aspiration into airway   PEG (percutaneous  endoscopic gastrostomy) status (HCC)   Rectal bleeding   Rectal mass   Anal condylomata  1 rectal bleeding and 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  Patient was seen by GI, [name] as well as [name] of general surgery and [name] of oncology who will follow the patient.  Patient well-known to [name] as patient was seen in the clinic September 2020.  Patient noted to have a large anal mass which appeared most consistent with anogenital condylomata.  There was no indication for biopsy at this time and per general surgery patient needs to be referred to a colorectal surgeon for further evaluation.  It was noted that patient was initially referred to [facility] in September however did not make the appointment.  Hemoglobin currently stable.  Patient on Eliquis.  Will need referral to colorectal surgery post discharge for further evaluation.  Contacted general surgeon, Dr. Kae Heller, who stated that patient's referral is still active and patient will just need to call to make an appointment to follow-up with colorectal surgeon at [facility].  Appreciate general surgery, GI and oncology's input and recommendations.  2.  Acute hypoxic and hypercapnic respiratory failure due to aspiration pneumonia Resolved.  Patient currently on room air with good sats.  Patient speaking in full sentences.  Patient currently receiving PEG tube feedings and is currently n.p.o. except for ice chips.  Status post IV Unasyn.  Continue duo nebs.  Follow.  3.  Dysphagia/benign-appearing esophageal stenosis status post dilatation/status post PEG tube placement by IR x2 Patient noted to have severe aspiration  episodes.  Patient was seen by GI underwent upper endoscopy 08/02/2019 that showed a benign-appearing esophageal stenosis status post dilation.  Patient underwent PEG tube placement by IR on 08/06/2019.  Patient pulled PEG tube on 08/10/2019 in the evening IR reconsulted for PEG tube placement.  Initially  per [name] patient just had a PEG tube placed on 1026 and patient pulled out the tube and was felt at that time not appropriate and risky to place PEG tube again.  Patient started on PEG tube feeds.  Patient's mental status improved on 08/16/2019 and patient promised Dr.Niu that he would not pull out PEG tube if it was placed again.  IR reconsulted and PEG tube placed on 08/17/2019.  Plain films done showed PEG tube in good position.  Tube feeds started on 08/18/2019.  Patient has been transitioned to bolus tube feeds which he is tolerating.  Follow.   4.  Moderate protein calorie malnutrition Nutritionist consulted.  Patient on tube feeds via PEG tube.  Dietitian consulted and patient being transitioned to bolus feeds as patient unable to go to a skilled nursing facility.  Patient currently homeless.  Patient started on bolus tube feeds which he is tolerating.  Follow.   5.  Small right lung pulmonary emboli Patient was on Lovenox which has been discontinued.  Patient started on Eliquis on 08/18/2019.  Outpatient follow-up.  6.  Chronic iron deficiency anemia Status post IV iron 07/29/2019.  Currently stable at 10.7.  Repeat labs in the morning.   7.  History of DTs with history of alcohol abuse/alcohol induced mood disorder/Warnicke's encephalopathy Resolved.  Patient was placed on the Precedex drip which has been subsequently weaned off.  Patient transferred out of the ICU.  Patient was seen in consultation by psychiatry for psychosis and patient was reevaluated secondary to behavioral nonadherence versus delirium.  Psychiatry wrote for Haldol as needed on initial consultation.  Continue thiamine, folic acid, multivitamin.  Follow.  8.  Tobacco use Stable.  Nicotine patch.  9.  Elevated LFTs Likely secondary to alcohol use.  Right upper quadrant ultrasound with hepatic steatosis.  Outpatient follow-up.  63.  Fall with lip laceration Patient noted to have a lip laceration from injury with  small tooth fragments in the depths of the wound.  [name] from ENT was consulted.  Patient was offered option of numbing the area and probing the depths of the wound to see if might locate small dental fragment and possibly placing couple of loose stitches to better align his flap of tissue but patient declined.  Patient started on Augmentin 08/15/2019 transitioned to Unasyn and back to Augmentin to complete a course of antibiotic treatment today.   11.  Left hand cellulitis Resolved.  Felt secondary to bug bite.  Patient seen by orthopedic physician.  No evidence of deeper infection that would require I&D.  Patient received IV Unasyn and currently on Augmentin to complete of course of antibiotic treatment today.  DVT prophylaxis: Eliquis Code Status: Full Family Communication: Updated patient.  No family at bedside. Disposition Plan: To be determined.  Likely SNF if patient will be accepted into SNF as patient currently homeless and likely unsafe and needs to be in a environment where he could be monitored closely and PEG tube monitored with tube feeds.   Consultants:   Oncology: [name] 08/20/2019  General surgery: [name] 08/20/2019  Gastroenterology: Napoleon Form 08/20/2019  Palliative care: Asencion Gowda, NP 08/16/2019  ENT surgery: [name] 08/15/2019  Psychiatry: Theodoro Clock  Lord, NP 08/11/2019  Orthopedics: Dr. Rudene Christians 08/05/2019  PCCM: Marda Stalker, NP  Gastroenterology: Dr. Bonna Gains 07/29/2019  Procedures:   PEG tube placement 08/17/2019  CT angiogram chest 07/28/2019  CT head CT maxillofacial 08/14/2019  Abdominal films 08/08/2019 08/18/2019  EGD with benign esophageal stenosis with dilatation per Dr. Allen Norris 08/02/2019  Abdominal ultrasound 08/01/2019  Antimicrobials:   Augmentin 08/17/2019>>>> 08/26/2019  IV Ancef 08/06/2019>>>> 08/07/2019  IV Unasyn 07/31/2019>>>>> 08/09/2019   Subjective: Patient sitting up in bed speaking with the chaplain.  Patient denies  any chest pain or shortness of breath.  Patient with ongoing upper/lower abdominal pain that has been chronic and ongoing prior to admission.  Complaint of anorectal lesion.  Patient has been started on bolus tube feeds.  Objective: Vitals:   08/25/19 1538 08/25/19 2057 08/26/19 0348 08/26/19 0749  BP: (!) 132/106 (!) 108/91 (!) 123/93 (!) 109/91  Pulse: 97 92 91 94  Resp: 16 20 19 18   Temp: 98.6 F (37 C) 97.6 F (36.4 C) 98.4 F (36.9 C) 98.4 F (36.9 C)  TempSrc: Oral Oral Oral Oral  SpO2: 99% 99% 99% 96%  Weight:   67.8 kg   Height:        Intake/Output Summary (Last 24 hours) at 08/26/2019 1209 Last data filed at 08/26/2019 1000 Gross per 24 hour  Intake 250 ml  Output 200 ml  Net 50 ml   Filed Weights   08/24/19 1542 08/25/19 0436 08/26/19 0348  Weight: 66.3 kg 66.8 kg 67.8 kg    Examination:  General exam: NAD Respiratory system: Clear to auscultation bilaterally.  No wheezes, no crackles, no rhonchi.  Normal respiratory effort.  Cardiovascular system: RRR no murmurs rubs or gallops.  No JVD.  No lower extremity edema.   Gastrointestinal system: Abdomen is soft, nontender, nondistended, positive bowel sounds.  PEG tube intact.  Central nervous system: Alert and oriented. No focal neurological deficits. Extremities: Symmetric 5 x 5 power. Skin: No rashes, lesions or ulcers Psychiatry: Judgement and insight appear normal. Mood & affect appropriate.     Data Reviewed: I have personally reviewed following labs and imaging studies  CBC: Recent Labs  Lab 08/20/19 1107 08/20/19 1724 08/21/19 0545 08/22/19 0534 08/23/19 0603 08/26/19 0507  WBC 7.9 7.5 4.5 4.2  --  6.1  HGB 11.0* 11.1* 9.7* 10.2* 11.3* 10.7*  HCT 35.5* 37.6* 33.6* 33.1* 36.8* 36.0*  MCV 81.8 85.8 86.8 81.9  --  84.9  PLT 356 354 271 264  --  643   Basic Metabolic Panel: Recent Labs  Lab 08/20/19 0405 08/21/19 0545 08/22/19 0534 08/23/19 0603 08/26/19 0507  NA 141 140 139 137  --   K  4.1 4.1 3.9 4.1  --   CL 109 106 105 102  --   CO2 26 28 29 27   --   GLUCOSE 95 95 133* 132*  --   BUN 5* 9 11 9   --   CREATININE 0.50* 0.54* 0.53* 0.40* 0.45*  CALCIUM 8.3* 8.5* 8.5* 8.9  --   MG 2.5* 2.2 1.9  --   --   PHOS 3.3 3.8 4.6  --   --    GFR: Estimated Creatinine Clearance: 90.6 mL/min (A) (by C-G formula based on SCr of 0.45 mg/dL (L)). Liver Function Tests: No results for input(s): AST, ALT, ALKPHOS, BILITOT, PROT, ALBUMIN in the last 168 hours. No results for input(s): LIPASE, AMYLASE in the last 168 hours. No results for input(s): AMMONIA in the last 168 hours. Coagulation  Profile: No results for input(s): INR, PROTIME in the last 168 hours. Cardiac Enzymes: No results for input(s): CKTOTAL, CKMB, CKMBINDEX, TROPONINI in the last 168 hours. BNP (last 3 results) No results for input(s): PROBNP in the last 8760 hours. HbA1C: No results for input(s): HGBA1C in the last 72 hours. CBG: Recent Labs  Lab 08/21/19 2119  GLUCAP 85   Lipid Profile: No results for input(s): CHOL, HDL, LDLCALC, TRIG, CHOLHDL, LDLDIRECT in the last 72 hours. Thyroid Function Tests: No results for input(s): TSH, T4TOTAL, FREET4, T3FREE, THYROIDAB in the last 72 hours. Anemia Panel: No results for input(s): VITAMINB12, FOLATE, FERRITIN, TIBC, IRON, RETICCTPCT in the last 72 hours. Sepsis Labs: No results for input(s): PROCALCITON, LATICACIDVEN in the last 168 hours.  No results found for this or any previous visit (from the past 240 hour(s)).       Radiology Studies: No results found.      Scheduled Meds: . amoxicillin-clavulanate  1 tablet Oral Q12H  . apixaban  5 mg Per Tube BID  . feeding supplement (OSMOLITE 1.5 CAL)  474 mL Per Tube TID  . folic acid  1 mg Per Tube Daily  . free water  150 mL Per Tube Q4H  . pantoprazole sodium  40 mg Per Tube BID  . senna-docusate  1 tablet Oral BID  . sodium chloride flush  3 mL Intravenous Q12H  . thiamine  100 mg Per Tube Daily    [facility] Infusions: . sodium chloride 250 mL (08/19/19 1058)     LOS: 29 days    Time spent: 35 minutes    Irine Seal, MD [facility]  If 7PM-7AM, please contact night-coverage [email or web address] 08/26/2019, 12:09 PM

## 2019-08-26 NOTE — Progress Notes (Signed)
Chaplain received a referral to support the patient and offer a listening ear regarding changes in his life. Upon arrival, the patient was sitting up in his bed using his computer. He expressed gratitude for the visit and reported that he was eager to talk. The patient shared that he is feeling "upset" with his life and how things are going. The patient was candid about the concerns he has at this time and "feeling blue/down/depressed." The patient reported that he has experienced a lot of challenges recently. The patient reported feeling that many of those challenges have been as a result of his own decision-making and poor judgement, while others have just been as a result of the unpredictability of life. The patient shared his concerns about family dynamics and relationships with those who matter most to him. The patient seems to be earnestly committed to taking responsibility for his circumstances and to doing what he needs to do to improve his life. This chaplain supported the patient by offering compassionate ministerial presence, active and reflective listening, theological reflection, re-framing, and prayer.  The patient requested assistance with contacting his son to request contact information for other family members. This chaplain will phone the patient's son.

## 2019-08-27 LAB — BASIC METABOLIC PANEL
Anion gap: 8 (ref 5–15)
BUN: 10 mg/dL (ref 8–23)
CO2: 26 mmol/L (ref 22–32)
Calcium: 8.9 mg/dL (ref 8.9–10.3)
Chloride: 104 mmol/L (ref 98–111)
Creatinine, Ser: 0.42 mg/dL — ABNORMAL LOW (ref 0.61–1.24)
GFR calc Af Amer: >60 mL/min (ref >60)
GFR calc non Af Amer: >60 mL/min (ref >60)
Glucose, Bld: 98 mg/dL (ref 70–99)
Potassium: 4 mmol/L (ref 3.5–5.1)
Sodium: 138 mmol/L (ref 135–145)

## 2019-08-27 LAB — CBC
HCT: 33.3 % — ABNORMAL LOW (ref 39.0–52.0)
Hemoglobin: 10 g/dL — ABNORMAL LOW (ref 13.0–17.0)
MCH: 25.4 pg — ABNORMAL LOW (ref 26.0–34.0)
MCHC: 30 g/dL (ref 30.0–36.0)
MCV: 84.5 fL (ref 80.0–100.0)
Platelets: 230 10*3/uL (ref 150–400)
RBC: 3.94 MIL/uL — ABNORMAL LOW (ref 4.22–5.81)
RDW: 23.1 % — ABNORMAL HIGH (ref 11.5–15.5)
WBC: 4.9 10*3/uL (ref 4.0–10.5)
nRBC: 0 % (ref 0.0–0.2)

## 2019-08-27 NOTE — TOC Progression Note (Addendum)
Transition of Care (TOC) - Progression Note    Patient Details  Name: George Wade. MRN: [id number] Date of Birth: 1955-10-30  [facility]) CM/SW [place], LCSW Phone Number: 08/27/2019, 10:39 AM  Clinical Narrative:  [facility] is officially unable to provide tube feed formula due to homelessness because they do not have a permanent address to send future supplies and cannot guarantee he will receive them. Left message for Carolynn Sayers with [facility] to notify.   3:24 pm: Patient had no updates when we initially spoke this afternoon. He has not talked to his son and not heard back from the shelter. He stated he is calling daily. Patient said he found out the other day that his son took the semester off so he is not sure what he is using the money for. Patient's son does have a job at [employer]. Patient is visibly upset about this. Surveyor, quantity of social work made arrangements for [facility] to supply tube feeds as long as they have an address for where he will discharge. CSW asked [employer] representative to get an estimated cost 2 weeks and one month. Will do 2-week LOG for the feeds and then patient will be responsible for paying for it. Patient is aware and agreeable. CSW told patient he needs to take time tonight to figure out where he is going so these arrangements can be made and he can discharge. Patient said the MD told him social services would be in to see him. CSW explained he was talking about this CSW.      Expected Discharge Plan and Services                                                 Social Determinants of Health (SDOH) Interventions    Readmission Risk Interventions No flowsheet data found.

## 2019-08-27 NOTE — Plan of Care (Signed)
  Problem: Activity: Goal: Risk for activity intolerance will decrease Outcome: Progressing   Problem: Nutrition: Goal: Adequate nutrition will be maintained Outcome: Progressing   Problem: Pain Managment: Goal: General experience of comfort will improve Outcome: Progressing   
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
## 2019-08-27 NOTE — Progress Notes (Signed)
Physical Therapy Treatment Patient Details Name: George Wade. MRN: [id number] DOB: [birth year] Today's Date: 08/27/2019    History of Present Illness Pt admitted for chest pain and now diagnosed with alcohol induced mood disorder. History includes alcohol abuse and HTN. Hospital stay complicated by transfer to CCU secondary to respiratory distress on 07/31/19 and then PEG tube placement on 10/26 secondary to severe esophageal stricture. Pt now pulled out PEG tube and has been agitated with RN staff. Pt is now s/p fall out of bed on 11/3. Pt re-evaluated this date as received new PEG tube on 11/7 and is less impulsive.    PT Comments    Pt is making good progress towards goals with increased independence regarding mobility. Able to perform ambulation without AD with cga for safety. Also able to tolerate increased challenge to strength, balance, mental activities. Very appreciative of therapy and appears motivated. Will continue to progress as able.   Follow Up Recommendations  Outpatient PT     Equipment Recommendations  Rolling walker with 5" wheels    Recommendations for Other Services       Precautions / Restrictions Precautions Precautions: Fall Restrictions Weight Bearing Restrictions: No    Mobility  Bed Mobility Overal bed mobility: Independent Bed Mobility: Supine to Sit           General bed mobility comments: safe technique with no issues  Transfers Overall transfer level: Modified independent Equipment used: Rolling walker (2 wheeled) Transfers: Sit to/from Stand Sit to Stand: Modified independent (Device/Increase time)         General transfer comment: safe and upright posture  Ambulation/Gait Ambulation/Gait assistance: Supervision Gait Distance (Feet): 300 Feet Assistive device: Rolling walker (2 wheeled) Gait Pattern/deviations: Step-through pattern     General Gait Details: ambulated first lap with RW and additional lap without AD with  cga. Safe technique performed. Good speed noted with reciprocal gait pattern and no LOB.   Stairs             Wheelchair Mobility    Modified Rankin (Stroke Patients Only)       Balance Overall balance assessment: History of Falls;Modified Independent Sitting-balance support: Feet supported Sitting balance-Leahy Scale: Normal     Standing balance support: No upper extremity supported Standing balance-Leahy Scale: Good                              Cognition Arousal/Alertness: Awake/alert Behavior During Therapy: WFL for tasks assessed/performed Overall Cognitive Status: Within Functional Limits for tasks assessed                                        Exercises Other Exercises Other Exercises: seated/standing ther-ex including heel raises, LAQ with opposite shoulder flexion, mini squats, marching, and wall bumps with varying distance from wall. All ther-ex performed x 20 reps with supervision. 1 seated rest break required secondary to fatigue Other Exercises: ambulated 2 x 40' sideways holding onto railing in hallway on first attempt and no railing on 2nd attempt; as well as 2x 40' in post direction. Decreased step length noted and pt needs cues for confidence. No AD used with cues for increased L step length    General Comments        Pertinent Vitals/Pain Pain Assessment: No/denies pain    Home Living  Prior Function            PT Goals (current goals can now be found in the care plan section) Acute Rehab PT Goals Patient Stated Goal: to get stronger PT Goal Formulation: With patient Time For Goal Achievement: 09/04/19 Potential to Achieve Goals: Good Progress towards PT goals: Progressing toward goals    Frequency    Min 2X/week      PT Plan Current plan remains appropriate    Co-evaluation              AM-PAC PT "6 Clicks" Mobility   Outcome Measure  Help needed turning from  your back to your side while in a flat bed without using bedrails?: None Help needed moving from lying on your back to sitting on the side of a flat bed without using bedrails?: None Help needed moving to and from a bed to a chair (including a wheelchair)?: None Help needed standing up from a chair using your arms (e.g., wheelchair or bedside chair)?: None Help needed to walk in hospital room?: None Help needed climbing 3-5 steps with a railing? : None 6 Click Score: 24    End of Session Equipment Utilized During Treatment: Gait belt Activity Tolerance: Patient tolerated treatment well Patient left: in bed;with bed alarm set Nurse Communication: Mobility status PT Visit Diagnosis: Muscle weakness (generalized) (M62.81);Difficulty in walking, not elsewhere classified (R26.2);History of falling (Z91.81);Pain;Unsteadiness on feet (R26.81)     Time: 0973-5329 PT Time Calculation (min) (ACUTE ONLY): 33 min  Charges:  $Gait Training: 8-22 mins $Therapeutic Exercise: 8-22 mins                     Greggory Stallion, PT, DPT [phone]    George Wade 08/27/2019, 4:31 PM

## 2019-08-27 NOTE — Progress Notes (Signed)
PROGRESS NOTE    George Wade.  JAS:[id number] DOB: 07/17/1956 DOA: 07/28/2019 PCP: Patient, [name] Per    Brief Narrative:  George Wade a62 y.o.malewith a known history of new medical problems as below. The patient presents the ED with above chief complaints.The patient presents the ED with above chief complaints. He has had chest pain in the central area, which is extirpated by cough and swallowing food. He also complains of shortness breath and cough. He has been drinking alcohol on daily basis. The last drink was yesterday. He denies any tremors or shaking. He is found elevated d-dimer and CT angiogram of the chest report right-sided PE. [name] requestedadmission and willstart heparin drip.  Interim History: -PEG tube is placed by [name] from IR 11/6 -X-ray of abdomen showed PEG tube position [facility] 11/7 -11/8: start tube feeding, pt has nausea and vomited several times with nonbiliary nonbloody vomitus.  No diarrhea or abdominal pain. -11/9: pt had rectal bleeding, GI, [name] of GI, [name] of Surgery and [name] of Oncology were consulted. Pt was known by 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 No indication for biopsy at this time. he should be referred to a colorectal surgeon for evaluation of this. In September he preferred [facility].  -11/9: Hgb 9.9 -->11.0 -->11.1 - 11/10: Mg 2.2, K 4.1, and phosphorus 3.8    Assessment & Plan:   Principal Problem:   Alcohol-induced mood disorder (HCC) Active Problems:   Wernicke's encephalopathy   Alcohol dependence with uncomplicated intoxication ([place])   Pulmonary emboli (HCC)   Nausea & vomiting   Malnutrition of moderate degree   Problems with swallowing and mastication   Stricture and stenosis of esophagus   Fall   Elevated LFTs   Esophageal stricture   Aspiration into airway   PEG (percutaneous  endoscopic gastrostomy) status (HCC)   Rectal bleeding   Rectal mass   Anal condylomata  1 rectal bleeding and 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  Patient was seen by GI, [name] as well as [name] of general surgery and [name] of oncology who will follow the patient.  Patient well-known to [name] as patient was seen in the clinic September 2020.  Patient noted to have a large anal mass which appeared most consistent with anogenital condylomata.  There was no indication for biopsy at this time and per general surgery patient needs to be referred to a colorectal surgeon for further evaluation.  It was noted that patient was initially referred to [facility] in September however did not make the appointment.  Hemoglobin currently stable.  Patient on Eliquis.  Will need referral to colorectal surgery post discharge for further evaluation.  Contacted general surgeon, Dr. Kae Heller, who stated that patient's referral is still active and patient will just need to call to make an appointment to follow-up with colorectal surgeon at [facility].  Appreciate general surgery, GI and oncology's input and recommendations.  2.  Acute hypoxic and hypercapnic respiratory failure due to aspiration pneumonia Resolved.  Patient currently on room air with good sats.  Patient speaking in full sentences.  Patient currently receiving PEG tube feedings and is currently n.p.o. except for ice chips.  Status post IV Unasyn.  Continue duo nebs.  Follow.  3.  Dysphagia/benign-appearing esophageal stenosis status post dilatation/status post PEG tube placement by IR x2 Patient noted to have severe aspiration  episodes.  Patient was seen by GI underwent upper endoscopy 08/02/2019 that showed a benign-appearing esophageal stenosis status post dilation.  Patient underwent PEG tube placement by IR on 08/06/2019.  Patient pulled PEG tube on 08/10/2019 in the evening IR reconsulted for PEG tube placement.  Initially  per [name] patient just had a PEG tube placed on 10/26 and patient pulled out the tube and was felt at that time not appropriate and risky to place PEG tube again.  Patient's mental status improved on 08/16/2019 and patient promised Dr.Niu that he would not pull out PEG tube if it was placed again.  IR reconsulted and PEG tube placed on 08/17/2019.  Plain films done showed PEG tube in good position.  Tube feeds started on 08/18/2019.  Patient has been transitioned to bolus tube feeds which he is tolerating.  Follow.   4.  Moderate protein calorie malnutrition Nutritionist consulted.  Patient on tube feeds via PEG tube.  Dietitian consulted and patient being transitioned to bolus feeds as patient unable to go to a skilled nursing facility.  Patient currently homeless.  Patient started on bolus tube feeds which he is tolerating.  Follow.   5.  Small right lung pulmonary emboli Patient was on Lovenox which has been discontinued.  Patient started on Eliquis on 08/18/2019.  Outpatient follow-up.  6.  Chronic iron deficiency anemia Status post IV iron 07/29/2019.  Currently stable at 10.    7.  History of DTs with history of alcohol abuse/alcohol induced mood disorder/Warnicke's encephalopathy Resolved.  Patient was placed on the Precedex drip which has been subsequently weaned off.  Patient transferred out of the ICU.  Patient was seen in consultation by psychiatry for psychosis and patient was reevaluated secondary to behavioral nonadherence versus delirium.  Psychiatry wrote for Haldol as needed on initial consultation.  Continue thiamine, folic acid, multivitamin.  Follow.  8.  Tobacco use Continue nicotine patch.    9.  Elevated LFTs Likely secondary to alcohol use.  Right upper quadrant ultrasound with hepatic steatosis.  Outpatient follow-up.  15.  Fall with lip laceration Patient noted to have a lip laceration from injury with small tooth fragments in the depths of the wound.  [name]  from ENT was consulted.  Patient was offered option of numbing the area and probing the depths of the wound to see if might locate small dental fragment and possibly placing couple of loose stitches to better align his flap of tissue but patient declined.  Patient started on Augmentin 08/15/2019 transitioned to Unasyn and back to Augmentin which he has completed.  No further antibiotics required.  11.  Left hand cellulitis Resolved.  Felt secondary to bug bite.  Patient seen by orthopedic physician.  No evidence of deeper infection that would require I&D.  Patient received IV Unasyn and transitioned to Augmentin and has completed course of antibiotic treatment.  No further antibiotics needed.  DVT prophylaxis: Eliquis Code Status: Full Family Communication: Updated patient.  No family at bedside. Disposition Plan: To be determined.  Likely SNF if patient will be accepted into SNF as patient currently homeless and likely unsafe and needs to be in a environment where he could be monitored closely and PEG tube monitored with tube feeds.   Consultants:   Oncology: [name] 08/20/2019  General surgery: [name] 08/20/2019  Gastroenterology: Napoleon Form 08/20/2019  Palliative care: Asencion Gowda, NP 08/16/2019  ENT surgery: [name] 08/15/2019  Psychiatry: Waylan Boga, NP 08/11/2019  Orthopedics: Dr. Rudene Christians  08/05/2019  PCCM: Marda Stalker, NP  Gastroenterology: Dr. Bonna Gains 07/29/2019  Procedures:   PEG tube placement 08/17/2019  CT angiogram chest 07/28/2019  CT head CT maxillofacial 08/14/2019  Abdominal films 08/08/2019 08/18/2019  EGD with benign esophageal stenosis with dilatation per Dr. Allen Norris 08/02/2019  Abdominal ultrasound 08/01/2019  Antimicrobials:   Augmentin 08/17/2019>>>> 08/26/2019  IV Ancef 08/06/2019>>>> 08/07/2019  IV Unasyn 07/31/2019>>>>> 08/09/2019   Subjective: Patient tolerating bolus tube feeds.  Sitting up in bed.  Denies any shortness of breath.  No  change with chronic ongoing upper abdominal pain.   Objective: Vitals:   08/26/19 1527 08/26/19 2046 08/27/19 0550 08/27/19 0740  BP: (!) 110/98 (!) 104/91 99/77 121/86  Pulse: 98 93 79 92  Resp: 20 17 18 19   Temp: 98.4 F (36.9 C) 98.1 F (36.7 C) 98.4 F (36.9 C) 98 F (36.7 C)  TempSrc: Oral Oral Oral Oral  SpO2: 100% 96% 99% 98%  Weight:      Height:        Intake/Output Summary (Last 24 hours) at 08/27/2019 1057 Last data filed at 08/26/2019 2047 Gross per 24 hour  Intake 300 ml  Output 750 ml  Net -450 ml   Filed Weights   08/24/19 1542 08/25/19 0436 08/26/19 0348  Weight: 66.3 kg 66.8 kg 67.8 kg    Examination:  General exam: NAD Respiratory system: Lungs clear to auscultation bilaterally.  No wheezes, no crackles, no rhonchi.  Normal respiratory effort.  Speaking in full sentences. Cardiovascular system: Regular rate rhythm no murmurs rubs or gallops.  No JVD.  No lower extremity edema.  Gastrointestinal system: Abdomen is nontender, nondistended, soft, positive bowel sounds.  PEG tube intact.  Central nervous system: Alert and oriented. No focal neurological deficits. Extremities: Symmetric 5 x 5 power. Skin: No rashes, lesions or ulcers Psychiatry: Judgement and insight appear normal. Mood & affect appropriate.     Data Reviewed: I have personally reviewed following labs and imaging studies  CBC: Recent Labs  Lab 08/20/19 1724 08/21/19 0545 08/22/19 0534 08/23/19 0603 08/26/19 0507 08/27/19 0529  WBC 7.5 4.5 4.2  --  6.1 4.9  HGB 11.1* 9.7* 10.2* 11.3* 10.7* 10.0*  HCT 37.6* 33.6* 33.1* 36.8* 36.0* 33.3*  MCV 85.8 86.8 81.9  --  84.9 84.5  PLT 354 271 264  --  232 992   Basic Metabolic Panel: Recent Labs  Lab 08/21/19 0545 08/22/19 0534 08/23/19 0603 08/26/19 0507 08/27/19 0529  NA 140 139 137  --  138  K 4.1 3.9 4.1  --  4.0  CL 106 105 102  --  104  CO2 28 29 27   --  26  GLUCOSE 95 133* 132*  --  98  BUN 9 11 9   --  10  CREATININE  0.54* 0.53* 0.40* 0.45* 0.42*  CALCIUM 8.5* 8.5* 8.9  --  8.9  MG 2.2 1.9  --   --   --   PHOS 3.8 4.6  --   --   --    GFR: Estimated Creatinine Clearance: 90.6 mL/min (A) (by C-G formula based on SCr of 0.42 mg/dL (L)). Liver Function Tests: No results for input(s): AST, ALT, ALKPHOS, BILITOT, PROT, ALBUMIN in the last 168 hours. No results for input(s): LIPASE, AMYLASE in the last 168 hours. No results for input(s): AMMONIA in the last 168 hours. Coagulation Profile: No results for input(s): INR, PROTIME in the last 168 hours. Cardiac Enzymes: No results for input(s): CKTOTAL, CKMB, CKMBINDEX, TROPONINI in  the last 168 hours. BNP (last 3 results) No results for input(s): PROBNP in the last 8760 hours. HbA1C: No results for input(s): HGBA1C in the last 72 hours. CBG: Recent Labs  Lab 08/21/19 2119  GLUCAP 85   Lipid Profile: No results for input(s): CHOL, HDL, LDLCALC, TRIG, CHOLHDL, LDLDIRECT in the last 72 hours. Thyroid Function Tests: No results for input(s): TSH, T4TOTAL, FREET4, T3FREE, THYROIDAB in the last 72 hours. Anemia Panel: No results for input(s): VITAMINB12, FOLATE, FERRITIN, TIBC, IRON, RETICCTPCT in the last 72 hours. Sepsis Labs: No results for input(s): PROCALCITON, LATICACIDVEN in the last 168 hours.  No results found for this or any previous visit (from the past 240 hour(s)).       Radiology Studies: No results found.      Scheduled Meds: . apixaban  5 mg Per Tube BID  . feeding supplement (OSMOLITE 1.5 CAL)  474 mL Per Tube TID  . folic acid  1 mg Per Tube Daily  . free water  150 mL Per Tube Q4H  . pantoprazole sodium  40 mg Per Tube BID  . senna-docusate  1 tablet Oral BID  . sodium chloride flush  3 mL Intravenous Q12H  . thiamine  100 mg Per Tube Daily   Continuous Infusions: . sodium chloride 250 mL (08/19/19 1058)     LOS: 30 days    Time spent: 35 minutes    Irine Seal, MD [facility]  If 7PM-7AM, please  contact night-coverage [email or web address] 08/27/2019, 10:57 AM

## 2019-08-28 MED ORDER — FREE WATER
100.0000 mL | Status: DC
  Administered 2019-08-28 – 2019-08-29 (×5): 100 mL

## 2019-08-28 NOTE — Progress Notes (Signed)
Nutrition Follow-up  DOCUMENTATION CODES:   Non-severe (moderate) malnutrition in context of social or environmental circumstances  INTERVENTION:   Osmolite 1.5- Give 2 cans TID via tube- flush with 37m of water before and after each tube feeding  Free water flushes 1046mq4 hours   Regimen provides 2130 kcal,  90 grams protein, 1866 ml/day free water  NUTRITION DIAGNOSIS:   Moderate Malnutrition related to social / environmental circumstances(EtOH abuse, limited access to well-balanced meals) as evidenced by mild fat depletion, moderate fat depletion, mild muscle depletion, moderate muscle depletion. ongoing  GOAL:   Patient will meet greater than or equal to 90% of their needs Met with tube feedings  MONITOR:   Labs, Weight trends, TF tolerance, Skin, I & O's  ASSESSMENT:   [age]ear old male with PMHx of HTN, hx non-Hodgkin's lymphoma, hx tonsillar cancer, EtOH abuse admitted with small right lung pulmonary emboli, chronic iron deficiency anemia, dysphagia.  Pt s/p EGD 10/23 found to have benign appearing esophageal stenosis which was dilated. Scope could not be passed beyond stricture.  Pt s/p IR replacement of 16 Fr balloon retention gastrostomy tube 11/6  Pt changed over to bolus tube feeds for discharge purposes. Pt tolerating tube feeds well. RD will adjust free water and add flushes before and after each tube feed. Per chart, pt down ~12lbs since admit but has been weight stable for the past week. Refeed labs stable.    Medications reviewed and include: folic acid, protonix, senokot, thiamine   Labs reviewed:   Diet Order:   Diet Order            Diet NPO time specified Except for: Ice Chips  Diet effective now             EDUCATION NEEDS:   No education needs have been identified at this time  Skin:  Skin Assessment: Reviewed RN Assessment  Last BM:  11/16- type 7  Height:   Ht Readings from Last 1 Encounters:  08/17/19 _0  (1.727 m)     Weight:   Wt Readings from Last 1 Encounters:  08/28/19 68.4 kg    Ideal Body Weight:  70 kg  BMI:  Body mass index is 22.94 kg/m.  Estimated Nutritional Needs:   Kcal:  1800-2000  Protein:  90-100 grams  Fluid:  1.8-2 L/day  CaKoleen DistanceS, RD, LDN Pager #- [phone]#- [phone] Hours Pager: [phone]

## 2019-08-28 NOTE — TOC Progression Note (Addendum)
Transition of Care (TOC) - Progression Note    Patient Details  Name: George Wade. MRN: [id number] Date of Birth: 02/23/1956  Transition of Care [facility]) CM/SW Starr, LCSW Phone Number: 08/28/2019, 9:02 AM  Clinical Narrative: Will provide 70-monthLOG for tube feed formula and rolling walker. Patient aware tube feed formula's will cost $264 per month. Patient has not secured a place to stay. He said he has the hotel on stand-by in case he needs it. He still has been unable to reach the shelter. CSW tried calling both phone numbers this morning and left voicemails. Never got a call back from last week. CSW emphasized that the hotel may be his best option but then patient said he only has around $160 in his bank account because he sent his son $125 last week and spent the rest of his money on bills. He gets paid next Friday. CSW explained he may have to drive himself to the shelter to follow-up in person but he is afraid he cannot drive due to increased blood from BM's that he cannot control. Patient stating he had significant bloody BM's last night and this has progressively worsened over the past week. Sent MD a message to notify.    9:30 am: Submitted requested to charitable foundation to see if they can assist with paying for a hotel until next Friday.  2:53 pm: CSW spoke with patient about discharging to a hotel with the $160 he has. Patient said "I already told you I can't leave until I get paid." Patient never told this CSW this so explained that he may have told this to the ICU social worker. Patient said the attending MD looked at his mass this morning and is consulting surgery. CSW met with patient after his meeting with the surgery PA student. He said they agreed that the mass is worse than the pictures show and [facility] There is no note in yet so cannot confirm this.       Expected Discharge Plan and Services                                     Social Determinants of Health (SDOH) Interventions    Readmission Risk Interventions No flowsheet data found.

## 2019-08-28 NOTE — Progress Notes (Signed)
Physical Therapy Treatment Patient Details Name: George Wade. MRN: [id number] DOB: 11-04-1955 Today's Date: 08/28/2019    History of Present Illness Pt admitted for chest pain and now diagnosed with alcohol induced mood disorder. History includes alcohol abuse and HTN. Hospital stay complicated by transfer to CCU secondary to respiratory distress on 07/31/19 and then PEG tube placement on 10/26 secondary to severe esophageal stricture. Pt now pulled out PEG tube and has been agitated with RN staff. Pt is now s/p fall out of bed on 11/3. Pt re-evaluated this date as received new PEG tube on 11/7 and is less impulsive.    PT Comments    Pt is making good progress towards goals with increased independence this date. Progressed to ambulation without AD and was safe with ambulation several laps around RN station. Pt also able to participate in obstacle course including different challenges as well as starting/stopping gait and dynamic balance. Will continue to progress as able. Pt is very close to meeting goals.   Follow Up Recommendations  Outpatient PT     Equipment Recommendations  None recommended by PT    Recommendations for Other Services       Precautions / Restrictions Precautions Precautions: Fall Restrictions Weight Bearing Restrictions: No    Mobility  Bed Mobility Overal bed mobility: Independent Bed Mobility: Supine to Sit     Supine to sit: Independent     General bed mobility comments: safe technique with no issues  Transfers Overall transfer level: Independent Equipment used: None Transfers: Sit to/from Stand Sit to Stand: Independent         General transfer comment: safe and upright posture  Ambulation/Gait Ambulation/Gait assistance: Supervision Gait Distance (Feet): 450 Feet Assistive device: None Gait Pattern/deviations: Step-through pattern     General Gait Details: ambulated without RW this date demonstrating reciprocal gait pattern  and good speed. Pt able to carry conversation during mobility and demonstrated no LOB.   Stairs             Wheelchair Mobility    Modified Rankin (Stroke Patients Only)       Balance Overall balance assessment: Independent;History of Falls                                          Cognition Arousal/Alertness: Awake/alert Behavior During Therapy: WFL for tasks assessed/performed Overall Cognitive Status: Within Functional Limits for tasks assessed                                        Exercises Other Exercises Other Exercises: performed obstacle course in hallway including stepping over obstacles varying in height, turning around obstacles, and picking obstacles off ground. Pt able to perform with cga. HR sustains in 130s with exertion. Other Exercises: performed standing dynamic balance activities including wall bumps from varying distances from wall. 2  x 10 reps. with supervision. Cues for correct technique    General Comments        Pertinent Vitals/Pain Pain Assessment: No/denies pain    Home Living                      Prior Function            PT Goals (current goals can now be found in the  care plan section) Acute Rehab PT Goals Patient Stated Goal: to get stronger PT Goal Formulation: With patient Time For Goal Achievement: 09/04/19 Potential to Achieve Goals: Good Progress towards PT goals: Progressing toward goals    Frequency    Min 2X/week      PT Plan Current plan remains appropriate    Co-evaluation              AM-PAC PT "6 Clicks" Mobility   Outcome Measure  Help needed turning from your back to your side while in a flat bed without using bedrails?: None Help needed moving from lying on your back to sitting on the side of a flat bed without using bedrails?: None Help needed moving to and from a bed to a chair (including a wheelchair)?: None Help needed standing up from a chair  using your arms (e.g., wheelchair or bedside chair)?: None Help needed to walk in hospital room?: None Help needed climbing 3-5 steps with a railing? : None 6 Click Score: 24    End of Session Equipment Utilized During Treatment: Gait belt Activity Tolerance: Patient tolerated treatment well Patient left: in bed Nurse Communication: Mobility status PT Visit Diagnosis: Muscle weakness (generalized) (M62.81);Difficulty in walking, not elsewhere classified (R26.2);History of falling (Z91.81);Pain;Unsteadiness on feet (R26.81)     Time: 1047-1110 PT Time Calculation (min) (ACUTE ONLY): 23 min  Charges:  $Gait Training: 8-22 mins $Therapeutic Exercise: 8-22 mins                     Greggory Stallion, PT, DPT [phone]    Caprina Wussow 08/28/2019, 12:01 PM

## 2019-08-28 NOTE — Progress Notes (Signed)
   08/28/19 1100  Clinical Encounter Type  Visited With Patient  Visit Type Follow-up  Referral From Patient   Chaplain followed up with the patient after visiting with him on Sunday evening. Upon arrival, the patient was sitting up in bed using his laptop. The patient was welcoming to this chaplain and provided updates since our last conversation. The patient reported that he had a rough night due to rectal discomfort/pain and bleeding. The patient expressed concern about his health how to move forward in this state, including prospects for securing and maintaining employment and well as engaging in normal everyday life. The patient expressed concern about what his happening and understands that a surgical intervention will be necessary. The patient reported that he is continuing talks about his next steps (housing accommodations, surgical treatments and appointments, etc.), and that he trying his best to manage his feelings of uncertainty around how things will turn out. This chaplain provided active and reflective listening, encouragement, and pointed questions as the patient processed these feelings. PT arrived to work with the patient, so this chaplain will return at a later time to continue the conversation.

## 2019-08-28 NOTE — Progress Notes (Signed)
PROGRESS NOTE    George Wade.  GHW:[id number] DOB: January 08, 1956 DOA: 07/28/2019 PCP: Patient, [name] Per    Brief Narrative:  George Wade a62 y.o.malewith a known history of new medical problems as below. The patient presents the ED with above chief complaints.The patient presents the ED with above chief complaints. He has had chest pain in the central area, which is extirpated by cough and swallowing food. He also complains of shortness breath and cough. He has been drinking alcohol on daily basis. The last drink was yesterday. He denies any tremors or shaking. He is found elevated d-dimer and CT angiogram of the chest report right-sided PE. [name] requestedadmission and willstart heparin drip.  Interim History: -PEG tube is placed by [name] from IR 11/6 -X-ray of abdomen showed PEG tube position [facility] 11/7 -11/8: start tube feeding, pt has nausea and vomited several times with nonbiliary nonbloody vomitus.  No diarrhea or abdominal pain. -11/9: pt had rectal bleeding, GI, [name] of GI, [name] of Surgery and [name] of Oncology were consulted. Pt was known by 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 No indication for biopsy at this time. he should be referred to a colorectal surgeon for evaluation of this. In September he preferred [facility].  -11/9: Hgb 9.9 -->11.0 -->11.1 - 11/10: Mg 2.2, K 4.1, and phosphorus 3.8    Assessment & Plan:   Principal Problem:   Alcohol-induced mood disorder (HCC) Active Problems:   Wernicke's encephalopathy   Alcohol dependence with uncomplicated intoxication ([place])   Pulmonary emboli (HCC)   Nausea & vomiting   Malnutrition of moderate degree   Problems with swallowing and mastication   Stricture and stenosis of esophagus   Fall   Elevated LFTs   Esophageal stricture   Aspiration into airway   PEG (percutaneous  endoscopic gastrostomy) status (HCC)   Rectal bleeding   Rectal mass   Anal condylomata  1 rectal bleeding and 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  Patient was seen by GI, [name] as well as [name] of general surgery and [name] of oncology who will follow the patient.  Patient well-known to [name] as patient was seen in the clinic September 2020.  Patient noted to have a large anal mass which appeared most consistent with anogenital condylomata.  There was no indication for biopsy at this time and per general surgery patient needs to be referred to a colorectal surgeon for further evaluation.  It was noted that patient was initially referred to [facility] in September however did not make the appointment.  Hemoglobin currently stable.  Patient on Eliquis.  Will need referral to colorectal surgery post discharge for further evaluation.  Contacted general surgeon, Dr. Kae Heller, who stated that patient's referral is still active and patient will just need to call to make an appointment to follow-up with colorectal surgeon at [facility].  Patient requested general surgery to reassess patient again due to bleeding from rectal lesion.  General surgery, Dr. Lysle Pearl reassess patient on 08/28/2019 and recommended that patient be transferred to a colorectal service as soon as possible for further work-up and possible treatment.  Called [facility] transfer line and spoke to Dr. Jason Nest, colorectal surgeon whose stated that anogenital condylomata did not need to be assessed as an inpatient transfer and patient needed to be seen in the outpatient setting for further evaluation.  Appreciate general  surgery, GI and oncology's input and recommendations.  2.  Acute hypoxic and hypercapnic respiratory failure due to aspiration pneumonia Resolved.  Patient currently on room air with good sats.  Patient speaking in full sentences.  Patient currently receiving PEG tube feedings and is currently n.p.o. except  for ice chips.  Status post IV Unasyn.  Continue duo nebs.  Follow.  3.  Dysphagia/benign-appearing esophageal stenosis status post dilatation/status post PEG tube placement by IR x2 Patient noted to have severe aspiration episodes.  Patient was seen by GI underwent upper endoscopy 08/02/2019 that showed a benign-appearing esophageal stenosis status post dilation.  Patient underwent PEG tube placement by IR on 08/06/2019.  Patient pulled PEG tube on 08/10/2019 in the evening IR reconsulted for PEG tube placement.  Initially per [name] patient just had a PEG tube placed on 10/26 and patient pulled out the tube and was felt at that time not appropriate and risky to place PEG tube again.  Patient's mental status improved on 08/16/2019 and patient promised Dr.Niu that he would not pull out PEG tube if it was placed again.  IR reconsulted and PEG tube placed on 08/17/2019.  Plain films done showed PEG tube in good position.  Tube feeds started on 08/18/2019.  Patient has been transitioned to bolus tube feeds which he is tolerating.  Follow.   4.  Moderate protein calorie malnutrition Nutritionist consulted.  Patient on tube feeds via PEG tube.  Dietitian consulted and patient being transitioned to bolus feeds as patient unable to go to a skilled nursing facility.  Patient currently homeless.  Patient started on bolus tube feeds which he is tolerating.  Follow.   5.  Small right lung pulmonary emboli Patient was on Lovenox which has been discontinued.  Patient started on Eliquis on 08/18/2019.  Outpatient follow-up.  6.  Chronic iron deficiency anemia Status post IV iron 07/29/2019.  Currently stable at 10 on 08/27/2019.  Repeat labs in the morning.    7.  History of DTs with history of alcohol abuse/alcohol induced mood disorder/Warnicke's encephalopathy Resolved.  Patient was placed on the Precedex drip which has been subsequently weaned off.  Patient transferred out of the ICU.  Patient was seen in  consultation by psychiatry for psychosis and patient was reevaluated secondary to behavioral nonadherence versus delirium.  Psychiatry wrote for Haldol as needed on initial consultation.  Continue thiamine, folic acid, multivitamin.  Follow.  8.  Tobacco use Stable.  Nicotine patch.   9.  Elevated LFTs Likely secondary to alcohol use.  Right upper quadrant ultrasound with hepatic steatosis.  Outpatient follow-up.  16.  Fall with lip laceration Patient noted to have a lip laceration from injury with small tooth fragments in the depths of the wound.  [name] from ENT was consulted.  Patient was offered option of numbing the area and probing the depths of the wound to see if might locate small dental fragment and possibly placing couple of loose stitches to better align his flap of tissue but patient declined.  Patient started on Augmentin 08/15/2019 transitioned to Unasyn and back to Augmentin which he has completed.  No further antibiotics required.  11.  Left hand cellulitis Resolved.  Felt secondary to bug bite.  Patient seen by orthopedic physician.  No evidence of deeper infection that would require I&D.  Patient received IV Unasyn and transitioned to Augmentin and has completed course of antibiotic treatment.  No further antibiotics needed.  DVT prophylaxis: Eliquis Code Status: Full Family  Communication: Updated patient.  No family at bedside. Disposition Plan: To be determined.  Likely SNF if patient will be accepted into SNF as patient currently homeless and likely unsafe and needs to be in a environment where he could be monitored closely and PEG tube monitored with tube feeds.   Consultants:   Oncology: [name] 08/20/2019  General surgery: [name] 08/20/2019  Gastroenterology: Napoleon Form 08/20/2019  Palliative care: Asencion Gowda, NP 08/16/2019  ENT surgery: [name] 08/15/2019  Psychiatry: Waylan Boga, NP 08/11/2019  Orthopedics: Dr. Rudene Christians 08/05/2019  PCCM: Marda Stalker, NP  Gastroenterology: Dr. Bonna Gains 07/29/2019  General surgery: Dr. Lysle Pearl 08/28/2019  Procedures:   PEG tube placement 08/17/2019  CT angiogram chest 07/28/2019  CT head CT maxillofacial 08/14/2019  Abdominal films 08/08/2019 08/18/2019  EGD with benign esophageal stenosis with dilatation per Dr. Allen Norris 08/02/2019  Abdominal ultrasound 08/01/2019  Antimicrobials:   Augmentin 08/17/2019>>>> 08/26/2019  IV Ancef 08/06/2019>>>> 08/07/2019  IV Unasyn 07/31/2019>>>>> 08/09/2019   Subjective: Patient sitting up in bed.  Patient states he has been having more bleeding from a new lesion and requesting to be assessed by general surgery again.  Patient denies any shortness of breath.  No change in chronic ongoing upper abdominal pain.   Objective: Vitals:   08/27/19 1933 08/28/19 0425 08/28/19 0809 08/28/19 1215  BP: 112/86 94/69 (!) 123/94 109/90  Pulse: (!) 108 73 96 (!) 102  Resp:   18   Temp: 98.1 F (36.7 C) 98.2 F (36.8 C) 98.6 F (37 C) 98.5 F (36.9 C)  TempSrc: Oral Oral Oral Oral  SpO2: 98% 96% 98% 98%  Weight:  68.4 kg    Height:        Intake/Output Summary (Last 24 hours) at 08/28/2019 1422 Last data filed at 08/28/2019 1230 Gross per 24 hour  Intake 3903 ml  Output 1254 ml  Net 2649 ml   Filed Weights   08/25/19 0436 08/26/19 0348 08/28/19 0425  Weight: 66.8 kg 67.8 kg 68.4 kg    Examination:  General exam: NAD Respiratory system: Clear to auscultation bilaterally.  No wheezes, no crackles, no rhonchi.  Normal respiratory effort.  Speaking in full sentences. Cardiovascular system: RRR no murmurs rubs or gallops.  No JVD.  No lower extremity edema.  Gastrointestinal system: Abdomen is nontender, nondistended, soft, positive bowel sounds.  PEG tube intact.  Central nervous system: Alert and oriented. No focal neurological deficits. Extremities: Symmetric 5 x 5 power. Skin: No rashes, lesions or ulcers Psychiatry: Judgement and insight  appear normal. Mood & affect appropriate.     Data Reviewed: I have personally reviewed following labs and imaging studies  CBC: Recent Labs  Lab 08/22/19 0534 08/23/19 0603 08/26/19 0507 08/27/19 0529  WBC 4.2  --  6.1 4.9  HGB 10.2* 11.3* 10.7* 10.0*  HCT 33.1* 36.8* 36.0* 33.3*  MCV 81.9  --  84.9 84.5  PLT 264  --  232 710   Basic Metabolic Panel: Recent Labs  Lab 08/22/19 0534 08/23/19 0603 08/26/19 0507 08/27/19 0529  NA 139 137  --  138  K 3.9 4.1  --  4.0  CL 105 102  --  104  CO2 29 27  --  26  GLUCOSE 133* 132*  --  98  BUN 11 9  --  10  CREATININE 0.53* 0.40* 0.45* 0.42*  CALCIUM 8.5* 8.9  --  8.9  MG 1.9  --   --   --   PHOS 4.6  --   --   --  GFR: Estimated Creatinine Clearance: 91.4 mL/min (A) (by C-G formula based on SCr of 0.42 mg/dL (L)). Liver Function Tests: No results for input(s): AST, ALT, ALKPHOS, BILITOT, PROT, ALBUMIN in the last 168 hours. No results for input(s): LIPASE, AMYLASE in the last 168 hours. No results for input(s): AMMONIA in the last 168 hours. Coagulation Profile: No results for input(s): INR, PROTIME in the last 168 hours. Cardiac Enzymes: No results for input(s): CKTOTAL, CKMB, CKMBINDEX, TROPONINI in the last 168 hours. BNP (last 3 results) No results for input(s): PROBNP in the last 8760 hours. HbA1C: No results for input(s): HGBA1C in the last 72 hours. CBG: Recent Labs  Lab 08/21/19 2119  GLUCAP 85   Lipid Profile: No results for input(s): CHOL, HDL, LDLCALC, TRIG, CHOLHDL, LDLDIRECT in the last 72 hours. Thyroid Function Tests: No results for input(s): TSH, T4TOTAL, FREET4, T3FREE, THYROIDAB in the last 72 hours. Anemia Panel: No results for input(s): VITAMINB12, FOLATE, FERRITIN, TIBC, IRON, RETICCTPCT in the last 72 hours. Sepsis Labs: No results for input(s): PROCALCITON, LATICACIDVEN in the last 168 hours.  No results found for this or any previous visit (from the past 240 hour(s)).        Radiology Studies: No results found.      Scheduled Meds: . apixaban  5 mg Per Tube BID  . feeding supplement (OSMOLITE 1.5 CAL)  474 mL Per Tube TID  . folic acid  1 mg Per Tube Daily  . free water  100 mL Per Tube Q4H  . pantoprazole sodium  40 mg Per Tube BID  . senna-docusate  1 tablet Oral BID  . sodium chloride flush  3 mL Intravenous Q12H  . thiamine  100 mg Per Tube Daily   Continuous Infusions: . sodium chloride 250 mL (08/19/19 1058)     LOS: 31 days    Time spent: 35 minutes    Irine Seal, MD [facility]  If 7PM-7AM, please contact night-coverage [email or web address] 08/28/2019, 2:22 PM

## 2019-08-28 NOTE — Progress Notes (Signed)
   08/28/19 0900  Anus/Rectum  Anus/Rectum (WDL) X  Evaluation/Tone Masses (warts/ bleeding/ MD aware)  MD Grandville Silos

## 2019-08-28 NOTE — Consult Note (Signed)
Subjective:   CC: Anal condyloma  HPI:  George Wade. is a 63 y.o. male who was consulted by Grandville Silos for evaluation of above.  First noted a few months ago.  Symptoms include: Mainly bleeding with bowel movements, long with spontaneous episodes.  He believes the bleeding is so frequent and severe that he will not be able to proceed with daily activities such as driving a car or working outside of the house.  Complains of having issues with keeping the area clean as well.  Currently in the hospital for prolonged hospital course, including PE, on Eliquis, mostly has resolved or has stabilized.  Patient was seen a few weeks ago by [name] as an inpatient consult for the same issue.  He was seen for this issue and an outpatient setting as well with [name], which at that time was really recommended to be seen by a colorectal surgeon due to the severity of the case.  He was unable to make that appointment due to extenuating circumstances.  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  Past Surgical History:  has a past surgical history that includes Replacement total knee bilateral (2009); Tonsillectomy (2008); Hernia repair (1958); Joint replacement; Esophagogastroduodenoscopy (N/A, 05/18/2019); Esophagogastroduodenoscopy (egd) with propofol (N/A, 08/02/2019); IR GASTROSTOMY TUBE MOD SED (08/06/2019); and IR GASTROSTOMY TUBE MOD SED (08/17/2019).  Family History: family history includes Cancer in his mother and paternal grandmother; Hypertension in his father.  Social History:  reports that he has never smoked. He has never used smokeless tobacco. He reports current alcohol use of about 5.0 standard drinks of alcohol per week. He  reports that he does not use drugs.  Current Medications:  Medications Prior to Admission  Medication Sig Dispense Refill  . Rivaroxaban (XARELTO) 15 MG TABS tablet Take 15 mg by mouth 2 (two) times daily with a meal.    . rivaroxaban (XARELTO) 20 MG TABS tablet Take 20 mg by mouth daily.      Allergies:  Allergies  Allergen Reactions  . Sulfa Antibiotics Nausea Only    ROS:  General: Denies weight loss, weight gain, fatigue, fevers, chills, and night sweats. Eyes: Denies blurry vision, double vision, eye pain, itchy eyes, and tearing. Ears: Denies hearing loss, earache, and ringing in ears. Nose: Denies sinus pain, congestion, infections, runny nose, and nosebleeds. Mouth/throat: Denies hoarseness, sore throat, bleeding gums, and difficulty swallowing. Heart: Denies chest pain, palpitations, racing heart, irregular heartbeat, leg pain or swelling, and decreased activity tolerance. Respiratory: Denies breathing difficulty, shortness of breath, wheezing, cough, and sputum. GI: Denies change in appetite, heartburn, nausea, vomiting, constipation, diarrhea.  GU: Denies difficulty urinating, pain with urinating, urgency, frequency, blood in urine. Musculoskeletal: Denies joint stiffness, pain, swelling, muscle weakness. Skin: Denies rash, itching, mass, tumors, sores, and boils Neurologic: Denies headache, fainting, dizziness, seizures, numbness, and tingling. Psychiatric: Denies depression, anxiety, difficulty sleeping, and memory loss. Endocrine: Denies heat or cold intolerance, and increased thirst or urination. Blood/lymph: Denies easy bruising, easy bruising, and swollen glands     Objective:     BP 109/90 (BP Location: Left Arm)   Pulse (!) 102   Temp 98.5 F (36.9 C) (Oral)   Resp 18   Ht 5\' 8"  (1.727 m)   Wt 68.4 kg   SpO2 98%   BMI  22.94 kg/m   Constitutional :  alert, cooperative, appears stated age and no distress  Lymphatics/Throat:  no asymmetry, masses, or  scars  Respiratory:  clear to auscultation bilaterally  Cardiovascular:  regular rate and rhythm  Gastrointestinal: soft, non-tender; bowel sounds normal; no masses,  no organomegaly.  Musculoskeletal: Steady gait and movement  Skin: Cool and moist  Psychiatric: Normal affect, non-agitated, not confused  Rectal: Please see picture of mass noted on original consult note on 08/20/2019 by [name].  Mass remains relatively unchanged, with no signs of active bleeding.  The mass is spreading beyond anal verge and extending along perineum.  Unable to visualize anal orifice and DRE not attempted due to risk of bleeding from excess manipulation.    LABS:  CMP Latest Ref Rng & Units 08/27/2019 08/26/2019 08/23/2019  Glucose 70 - 99 mg/dL 98 - 132(H)  BUN 8 - 23 mg/dL 10 - 9  Creatinine 0.61 - 1.24 mg/dL 0.42(L) 0.45(L) 0.40(L)  Sodium 135 - 145 mmol/L 138 - 137  Potassium 3.5 - 5.1 mmol/L 4.0 - 4.1  Chloride 98 - 111 mmol/L 104 - 102  CO2 22 - 32 mmol/L 26 - 27  Calcium 8.9 - 10.3 mg/dL 8.9 - 8.9  Total Protein 6.5 - 8.1 g/dL - - -  Total Bilirubin 0.3 - 1.2 mg/dL - - -  Alkaline Phos 38 - 126 U/L - - -  AST 15 - 41 U/L - - -  ALT 0 - 44 U/L - - -   CBC Latest Ref Rng & Units 08/27/2019 08/26/2019 08/23/2019  WBC 4.0 - 10.5 K/uL 4.9 6.1 -  Hemoglobin 13.0 - 17.0 g/dL 10.0(L) 10.7(L) 11.3(L)  Hematocrit 39.0 - 52.0 % 33.3(L) 36.0(L) 36.8(L)  Platelets 150 - 400 K/uL 230 232 -    RADS: n/a  Assessment:      Anal condyloma, 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  This mass likely is extending well into the rectum as well.  Due to the degree of disease involved, patient is better suited to be seen and treated by a colorectal specialist, in order to minimize any unnecessary or duplicate procedures and or intervention that may  increase his risk of complications, given his previous history of PE and his current anticoagulant use.  Recommendation at this point is to be transferred to a colorectal service as soon as possible for further work-up and possible treatment.  This recommendation was communicated to Dr. Grandville Silos attending hospitalist and he verbalized acknowledgment.  Since there is no sign of active bleeding, there is no acute surgical intervention needed by myself.  Surgery will be signing off at this point.  Please call us with any additional questions or concerns.

## 2019-08-29 LAB — BASIC METABOLIC PANEL
Anion gap: 5 (ref 5–15)
BUN: 12 mg/dL (ref 8–23)
CO2: 28 mmol/L (ref 22–32)
Calcium: 8.7 mg/dL — ABNORMAL LOW (ref 8.9–10.3)
Chloride: 104 mmol/L (ref 98–111)
Creatinine, Ser: 0.56 mg/dL — ABNORMAL LOW (ref 0.61–1.24)
GFR calc Af Amer: >60 mL/min (ref >60)
GFR calc non Af Amer: >60 mL/min (ref >60)
Glucose, Bld: 126 mg/dL — ABNORMAL HIGH (ref 70–99)
Potassium: 4.2 mmol/L (ref 3.5–5.1)
Sodium: 137 mmol/L (ref 135–145)

## 2019-08-29 LAB — CBC
HCT: 29.9 % — ABNORMAL LOW (ref 39.0–52.0)
Hemoglobin: 9.2 g/dL — ABNORMAL LOW (ref 13.0–17.0)
MCH: 25.4 pg — ABNORMAL LOW (ref 26.0–34.0)
MCHC: 30.8 g/dL (ref 30.0–36.0)
MCV: 82.6 fL (ref 80.0–100.0)
Platelets: 222 10*3/uL (ref 150–400)
RBC: 3.62 MIL/uL — ABNORMAL LOW (ref 4.22–5.81)
RDW: 22.5 % — ABNORMAL HIGH (ref 11.5–15.5)
WBC: 5.2 10*3/uL (ref 4.0–10.5)
nRBC: 0 % (ref 0.0–0.2)

## 2019-08-29 MED ORDER — HYDROCORTISONE 1 % EX CREA: TOPICAL_CREAM | Freq: Four times a day (QID) | CUTANEOUS | 0 refills | Status: DC | PRN

## 2019-08-29 MED ORDER — HYDROCODONE-ACETAMINOPHEN 5-325 MG PO TABS: 1.0000 | ORAL_TABLET | ORAL | 0 refills | Status: DC | PRN

## 2019-08-29 MED ORDER — FREE WATER: 100.0000 mL | Status: AC

## 2019-08-29 MED ORDER — OSMOLITE 1.5 CAL PO LIQD: 474.0000 mL | Freq: Three times a day (TID) | ORAL | 0 refills | Status: DC

## 2019-08-29 MED ORDER — OMEPRAZOLE 20 MG PO CPDR: 20.0000 mg | DELAYED_RELEASE_CAPSULE | Freq: Two times a day (BID) | ORAL | 0 refills | Status: DC

## 2019-08-29 MED ORDER — FOLIC ACID 1 MG PO TABS: 1.0000 mg | ORAL_TABLET | Freq: Every day | ORAL | 0 refills | Status: DC

## 2019-08-29 MED ORDER — ACETAMINOPHEN 325 MG PO TABS: 650.0000 mg | ORAL_TABLET | Freq: Four times a day (QID) | ORAL | Status: DC | PRN

## 2019-08-29 MED ORDER — APIXABAN 5 MG PO TABS: 5.0000 mg | ORAL_TABLET | Freq: Two times a day (BID) | ORAL | 0 refills | Status: DC

## 2019-08-29 MED ORDER — THIAMINE HCL 100 MG PO TABS: 100.0000 mg | ORAL_TABLET | Freq: Every day | ORAL | 0 refills | Status: DC

## 2019-08-29 MED ORDER — PANTOPRAZOLE SODIUM 40 MG PO PACK: 40.0000 mg | PACK | Freq: Two times a day (BID) | ORAL | 0 refills | Status: DC

## 2019-08-29 NOTE — Progress Notes (Signed)
Pt discharged to home via wc.  Instructions and rx given to pt.  Questions answered.  No distress.  

## 2019-08-29 NOTE — Plan of Care (Signed)
  Problem: Education: Goal: Knowledge of General Education information will improve Description: Including pain rating scale, medication(s)/side effects and non-pharmacologic comfort measures Outcome: Progressing   Problem: Health Behavior/Discharge Planning: Goal: Ability to manage health-related needs will improve Outcome: Progressing   Problem: Clinical Measurements: Goal: Ability to maintain clinical measurements within normal limits will improve Outcome: Progressing Goal: Will remain free from infection Outcome: Progressing Note: Remains afebrile Goal: Respiratory complications will improve Outcome: Progressing Goal: Cardiovascular complication will be avoided Outcome: Progressing Note: No arrhythmias overnight   Problem: Activity: Goal: Risk for activity intolerance will decrease Outcome: Progressing   Problem: Nutrition: Goal: Adequate nutrition will be maintained Outcome: Progressing Note: Tolerating Bolus feeds   Problem: Coping: Goal: Level of anxiety will decrease Outcome: Progressing   Problem: Elimination: Goal: Will not experience complications related to bowel motility Outcome: Progressing Goal: Will not experience complications related to urinary retention Outcome: Progressing   Problem: Pain Managment: Goal: General experience of comfort will improve Outcome: Progressing   Problem: Safety: Goal: Ability to remain free from injury will improve Outcome: Progressing   Problem: Skin Integrity: Goal: Risk for impaired skin integrity will decrease Outcome: Progressing

## 2019-08-29 NOTE — Plan of Care (Signed)
  Problem: Education: Goal: Knowledge of General Education information will improve Description: Including pain rating scale, medication(s)/side effects and non-pharmacologic comfort measures 08/29/2019 1610 by Etheleen Nicks, RN Outcome: Adequate for Discharge 08/29/2019 1103 by Etheleen Nicks, RN Outcome: Progressing   Problem: Health Behavior/Discharge Planning: Goal: Ability to manage health-related needs will improve 08/29/2019 1610 by Etheleen Nicks, RN Outcome: Adequate for Discharge 08/29/2019 1103 by Etheleen Nicks, RN Outcome: Progressing   Problem: Clinical Measurements: Goal: Ability to maintain clinical measurements within normal limits will improve 08/29/2019 1610 by Etheleen Nicks, RN Outcome: Adequate for Discharge 08/29/2019 1103 by Etheleen Nicks, RN Outcome: Progressing Goal: Will remain free from infection 08/29/2019 1610 by Etheleen Nicks, RN Outcome: Adequate for Discharge 08/29/2019 1103 by Etheleen Nicks, RN Outcome: Progressing Note: Remains afebrile Goal: Diagnostic test results will improve Outcome: Adequate for Discharge Goal: Respiratory complications will improve 08/29/2019 1610 by Etheleen Nicks, RN Outcome: Adequate for Discharge 08/29/2019 1103 by Etheleen Nicks, RN Outcome: Progressing Goal: Cardiovascular complication will be avoided 08/29/2019 1610 by Etheleen Nicks, RN Outcome: Adequate for Discharge 08/29/2019 1103 by Etheleen Nicks, RN Outcome: Progressing Note: No arrhythmias overnight   Problem: Activity: Goal: Risk for activity intolerance will decrease 08/29/2019 1610 by Etheleen Nicks, RN Outcome: Adequate for Discharge 08/29/2019 1103 by Etheleen Nicks, RN Outcome: Progressing   Problem: Nutrition: Goal: Adequate nutrition will be maintained 08/29/2019 1610 by Etheleen Nicks, RN Outcome: Adequate for Discharge 08/29/2019 1103 by Etheleen Nicks, RN Outcome: Progressing Note: Tolerating Bolus feeds   Problem:  Coping: Goal: Level of anxiety will decrease 08/29/2019 1610 by Etheleen Nicks, RN Outcome: Adequate for Discharge 08/29/2019 1103 by Etheleen Nicks, RN Outcome: Progressing   Problem: Elimination: Goal: Will not experience complications related to bowel motility 08/29/2019 1610 by Etheleen Nicks, RN Outcome: Adequate for Discharge 08/29/2019 1103 by Etheleen Nicks, RN Outcome: Progressing Goal: Will not experience complications related to urinary retention 08/29/2019 1610 by Etheleen Nicks, RN Outcome: Adequate for Discharge 08/29/2019 1103 by Etheleen Nicks, RN Outcome: Progressing   Problem: Pain Managment: Goal: General experience of comfort will improve 08/29/2019 1610 by Etheleen Nicks, RN Outcome: Adequate for Discharge 08/29/2019 1103 by Etheleen Nicks, RN Outcome: Progressing   Problem: Safety: Goal: Ability to remain free from injury will improve 08/29/2019 1610 by Etheleen Nicks, RN Outcome: Adequate for Discharge 08/29/2019 1103 by Etheleen Nicks, RN Outcome: Progressing   Problem: Skin Integrity: Goal: Risk for impaired skin integrity will decrease 08/29/2019 1610 by Etheleen Nicks, RN Outcome: Adequate for Discharge 08/29/2019 1103 by Etheleen Nicks, RN Outcome: Progressing

## 2019-08-29 NOTE — Discharge Summary (Signed)
Physician Discharge Summary  George Wade. ZWC:[id number] DOB: 07-Feb-1956 DOA: 07/28/2019  PCP: Patient, [name] Per  Admit date: 07/28/2019 Discharge date: 08/29/2019  Admitted From: Home  Disposition: Home (hotel)  Recommendations for Outpatient Follow-up:  1. Follow up with PCP within 1 week 2. Please obtain BMP/CBC in one week 3. Please follow up with [facility] colorectal surgeon as soon as possible  Home Health: No Equipment/Devices: Tube feed supplies  Discharge Condition: Stable CODE STATUS: Full Diet recommendation: Tube feeds  Brief/Interim Summary:  63 y.o.malewith a known history of new medical problems as below. The patient presents the ED with above chief complaints.The patient presents the ED with above chief complaints. He has had chest pain in the central area, which is exacerbated by cough and swallowing food. He also complains of shortness breath and cough. He has been drinking alcohol on daily basis. The last drink was yesterday. He denies any tremors or shaking. He is found elevated d-dimer and CT angiogram of the chest report right-sided PE. Patient started on heparin drip and admitted for further evaluation and management as outlined below.   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  Patient was seen by GI, [name] as well as [name] of general surgery and [name] of oncology who  will follow the patient.  Patient well-known to [name] as patient was seen in the clinic September 2020.  Patient noted to have a large anal mass which appeared most consistent with anogenital condylomata.  There was no indication for biopsy at this time and per general surgery patient needs to be referred to a colorectal surgeon for further evaluation.  It was noted that patient was initially referred to [facility] in September however did not make the appointment.  Hemoglobin currently stable.  Patient on Eliquis.  Will need referral to colorectal surgery post discharge for further evaluation.  Contacted general surgeon, Dr. Kae Heller, who stated that patient's referral is still active and patient will just need to call to make an appointment to follow-up with colorectal surgeon at [facility].  Patient requested general surgery to reassess patient again due to bleeding from rectal lesion.  General surgery, Dr. Lysle Pearl reassess patient on 08/28/2019 and recommended that patient be transferred to a colorectal service as soon as possible for further work-up and possible treatment.  Called [facility] transfer line and spoke to Dr. Jason Nest, colorectal surgeon whose stated that anogenital condylomata did not need to be assessed as an inpatient transfer and patient needed to be seen in the outpatient setting for further evaluation.  Appreciate general surgery, GI and oncology's input and recommendations.  2.  Acute hypoxic and hypercapnic respiratory failure due to aspiration pneumonia Resolved.  Patient currently on room air with good sats.  Patient speaking in full sentences.  Patient currently receiving PEG tube feedings and is currently n.p.o. except for ice chips.  Status post IV Unasyn, course completed.    3.  Dysphagia/benign-appearing esophageal stenosis status post dilatation/status post PEG tube placement by IR x2 Patient noted to have severe aspiration episodes.  Patient was seen by GI underwent upper endoscopy  08/02/2019 that showed a benign-appearing esophageal stenosis status post dilation.  Patient underwent PEG tube placement by IR on 08/06/2019.  Patient pulled PEG tube on 08/10/2019 in the evening IR reconsulted for PEG tube placement.  Initially per [name] patient just had a PEG tube placed on 10/26 and patient pulled out the tube and was felt at that time not appropriate and risky to place PEG tube again.  Patient's mental status improved on 08/16/2019 and patient promised Dr.Niu that he would not pull out PEG tube if it was placed again.  IR reconsulted and PEG tube placed on 08/17/2019.  Plain films done showed PEG tube in good position.  Tube feeds started on 08/18/2019.  Patient has been transitioned to bolus tube feeds which he is tolerating.  Follow.   4.  Moderate protein calorie malnutrition Nutritionist consulted.  Patient on tube feeds via PEG tube.  Dietitian consulted and patient being transitioned to bolus feeds as patient unable to go to a skilled nursing facility.  Patient currently homeless.  Patient started on bolus tube feeds which he is tolerating.  Follow.   5.  Small right lung pulmonary emboli Patient was on Lovenox which has been discontinued.  Patient started on Eliquis on 08/18/2019.  Outpatient follow-up.  6.  Chronic iron deficiency anemia Status post IV iron 07/29/2019.  Currently stable at 10 on 08/27/2019.  Repeat labs in the morning.    7.  History of DTs with history of alcohol abuse/alcohol induced mood disorder/Warnicke's encephalopathy Resolved.  Patient was placed on the Precedex drip which has been subsequently weaned off.  Patient transferred out of the ICU.  Patient was seen in consultation by psychiatry for psychosis and patient was reevaluated secondary to behavioral nonadherence versus delirium.  Psychiatry wrote for Haldol as needed on initial consultation.  Continue thiamine, folic acid, multivitamin.  Follow.  8.  Tobacco use Stable.  Nicotine  patch.   9.  Elevated LFTs Likely secondary to alcohol use.  Right upper quadrant ultrasound with hepatic steatosis.  Outpatient follow-up.  66.  Fall with lip laceration Patient noted to have a lip laceration from injury with small tooth fragments in the depths of the wound.  [name] from ENT was consulted.  Patient was offered option of numbing the area and probing the depths of the wound to see if might locate small dental fragment and possibly placing couple of loose stitches to better align his flap of tissue but patient declined.  Patient started on Augmentin 08/15/2019 transitioned to Unasyn and back to Augmentin which he has completed.  No further antibiotics required.  11.  Left hand cellulitis Resolved.  Felt secondary to bug bite.  Patient seen by orthopedic physician.  No evidence of deeper infection that would require I&D.  Patient received IV Unasyn and transitioned to Augmentin and has completed course of antibiotic treatment.  No further antibiotics needed.  Discharge Instructions  Take all medications as prescribed via PEG tube.    Establish a primary care physician soon as possible for close follow-up.  Avoid alcohol consumption.   Discharge Instructions    Call MD for:  redness, tenderness, or signs of infection (pain, swelling, redness, odor or green/yellow discharge around incision site)   Complete by: As directed    Call MD for:  temperature >100.4  Complete by: As directed    Diet - low sodium heart healthy   Complete by: As directed    Increase activity slowly   Complete by: As directed      Allergies as of 08/29/2019      Reactions   Sulfa Antibiotics Nausea Only      Medication List    STOP taking these medications   Rivaroxaban 15 MG Tabs tablet Commonly known as: XARELTO   rivaroxaban 20 MG Tabs tablet Commonly known as: XARELTO     TAKE these medications   acetaminophen 325 MG tablet Commonly known as: TYLENOL Take 2 tablets (650 mg  total) by mouth every 6 (six) hours as needed for mild pain (or Fever >/= 101).   apixaban 5 MG Tabs tablet Commonly known as: ELIQUIS Place 1 tablet (5 mg total) into feeding tube 2 (two) times daily.   feeding supplement (OSMOLITE 1.5 CAL) Liqd Place 474 mLs into feeding tube 3 (three) times daily.   folic acid 1 MG tablet Commonly known as: FOLVITE Place 1 tablet (1 mg total) into feeding tube daily. Start taking on: August 30, 2019   free water Soln Place 100 mLs into feeding tube every 4 (four) hours.   HYDROcodone-acetaminophen 5-325 MG tablet Commonly known as: NORCO/VICODIN Take 1-2 tablets by mouth every 4 (four) hours as needed for up to 5 days for moderate pain.   hydrocortisone cream 1 % Apply topically every 6 (six) hours as needed for itching.   omeprazole 20 MG capsule Commonly known as: PriLOSEC Take 1 capsule (20 mg total) by mouth 2 (two) times daily before a meal. Open capsule and empty granules into small amount of liquid.  Give by PEG tube twice daily.   thiamine 100 MG tablet Place 1 tablet (100 mg total) into feeding tube daily. Start taking on: August 30, 2019            Durable Medical Equipment  (From admission, onward)         Start     Ordered   08/22/19 4235  For home use only DME Walker rolling  Once    Question:  Patient needs a walker to treat with the following condition  Answer:  Debility   08/22/19 0810         Follow-up Information    Schedule an appointment as soon as possible for a visit with System, Adel.   Specialty: General Surgery Why: f/u in 1-2 weeks for appointment with colon and rectal surgery Contact information: [place] [address] [place], [facility] On 09/19/2019.   Why: Appt is at 2:40 pm to establish new primary care services. Contact information: [place] [id number] [phone]           Allergies  Allergen Reactions  . Sulfa Antibiotics Nausea Only    Consultations:  Oncology: [name] 08/20/2019  General surgery: [name] 08/20/2019  Gastroenterology: Napoleon Form 08/20/2019  Palliative care: Asencion Gowda, NP 08/16/2019  ENT surgery: [name] 08/15/2019  Psychiatry: Waylan Boga, NP 08/11/2019  Orthopedics: Dr. Rudene Christians 08/05/2019  PCCM: Marda Stalker, NP  Gastroenterology: Dr. Bonna Gains 07/29/2019  General surgery: Dr. Lysle Pearl 08/28/2019    Procedures/Studies: Dg Abd 1 View  Result Date: 08/18/2019 CLINICAL DATA:  PEG tube placement, placed 08/17/2019 and 08/06/2019. Per ordering physician no contrast administered. EXAM: ABDOMEN - 1 VIEW COMPARISON:  Abdominal radiograph  08/08/2019 FINDINGS: PEG tube is again visualized overlying the left upper quadrant. Patency cannot be assessed in the absence of contrast. There are no dilated loops of bowel. No supine evidence for free air. Oral contrast is seen throughout multiple loops of bowel to the level of the rectum. Visualized lung bases are clear. No acute finding in the visualized skeleton. IMPRESSION: 1. PEG tube overlies the left upper quadrant. Patency cannot be assessed in the absence of contrast. 2. Nonobstructive bowel gas pattern. Oral contrast is noted throughout multiple loops of bowel. Electronically Signed   By: Audie Pinto M.D.   On: 08/18/2019 14:27   Dg Abd 1 View  Result Date: 08/08/2019 CLINICAL DATA:  Status post PEG placement. EXAM: ABDOMEN - 1 VIEW COMPARISON:  Single-view of the abdomen 07/29/2019. FINDINGS: Feeding tube is identified with its tip projecting over the left aspect of the L2 vertebral body. Contrast in the colon is noted. IMPRESSION: New feeding tube in place. Electronically Signed   By: Inge Rise M.D.   On: 08/08/2019 13:55   Ct Head Wo Contrast  Result Date: 08/14/2019 CLINICAL DATA:  Initial evaluation for acute trauma, fall. EXAM: CT HEAD WITHOUT CONTRAST TECHNIQUE:  Contiguous axial images were obtained from the base of the skull through the vertex without intravenous contrast. COMPARISON:  Prior head CT from 07/17/2019. FINDINGS: Brain: Generalized age-related cerebral atrophy with mild chronic small vessel ischemic disease. No acute intracranial hemorrhage. No acute large vessel territory infarct. No mass lesion, midline shift or mass effect no hydrocephalus. No extra-axial fluid collection. Vascular: No hyperdense vessel. Scattered vascular calcifications noted within the carotid siphons. Skull: Scalp soft tissues demonstrate no acute finding. Calvarium intact. Sinuses/Orbits: Globes and orbital soft tissues within normal limits. Mild scattered mucosal thickening noted within the ethmoidal air cells and maxillary sinuses. No mastoid effusion. Other: None. IMPRESSION: 1. No acute intracranial abnormality. 2. Age-related cerebral atrophy with mild chronic small vessel ischemic disease, stable. Electronically Signed   By: Jeannine Boga M.D.   On: 08/14/2019 22:41   Ir Gastrostomy Tube Mod Sed  Result Date: 08/17/2019 CLINICAL DATA:  Esophageal stricture, needs enteral feeding support. Previously placed gastrostomy catheter was inadvertently removed. EXAM: PERC PLACEMENT GASTROSTOMY FLUOROSCOPY TIME:  3.8 minutes; 77.9 mGy TECHNIQUE: The procedure, risks, benefits, and alternatives were explained to the patient. Questions regarding the procedure were encouraged and answered. The patient understands and consents to the procedure. As antibiotic prophylaxis, cefazolin 1 g was ordered pre-procedure and administered intravenously within one hour of incision. Under intermittent fluoroscopy, a 5 French angiographic catheter was placed as orogastric tube. The upper abdomen was prepped with chlorhexidine, draped in usual sterile fashion, and infiltrated locally with 1% lidocaine. Intravenous Fentanyl 150mcg and Versed 2mg  were administered as conscious sedation during  continuous monitoring of the patient's level of consciousness and physiological / cardiorespiratory status by the radiology RN, with a total moderate sedation time of 18 minutes. 0.5 mg glucagon given IV to facilitate gastric distention.Stomach was insufflated using air through the orogastric tube. Attempts were made to pass an angled 5 French angiographic catheter through the previous tract under fluoroscopy using small amount of contrast with these were ultimately unsuccessful. Under fluoroscopic guidance between the 2 previously placed gastropexy fasteners, an 18 gauge entry needle was advanced percutaneously into the gastric lumen under fluoroscopy. Gas could be aspirated and a small contrast injection confirmed intraluminal spread. Guidewire advanced easily, within the gastric lumen on fluoroscopy. Needle was exchanged over a guidewire for serial vascular dilators  which allowed placement of a 67 French peel-away sheath, through which a 63 French balloon retention single-lumen gastrostomy catheter was advanced. The peel-away sheath was removed. The retention balloon was inflated with 7 mL sterile saline. Contrast injection confirms good positioning within the lumen of the stomach. The external bumper was applied and the catheter was flushed and capped. COMPLICATIONS: COMPLICATIONS none IMPRESSION: 1. Technically successful 58 French balloon-retention gastrostomy placement under fluoroscopy. Electronically Signed   By: Lucrezia Europe M.D.   On: 08/17/2019 12:59   Ir Gastrostomy Tube Mod Sed  Result Date: 08/06/2019 CLINICAL DATA:  Esophageal stricture and need for gastrostomy tube for nutrition. EXAM: PERCUTANEOUS GASTROSTOMY TUBE PLACEMENT ANESTHESIA/SEDATION: 3.0 mg IV Versed; 100 mcg IV Fentanyl. Total Moderate Sedation Time 30 minutes. The patient's level of consciousness and physiologic status were continuously monitored during the procedure by Radiology nursing. CONTRAST:  56mL VISIPAQUE IODIXANOL 320  MG/ML IV SOLN MEDICATIONS: 2 g IV Ancef. IV antibiotic was administered in an appropriate time interval prior to needle puncture of the skin. FLUOROSCOPY TIME:  5 minutes and 18 seconds.  100.5 mGy. PROCEDURE: The procedure, risks, benefits, and alternatives were explained to the patient. Questions regarding the procedure were encouraged and answered. The patient understands and consents to the procedure. A 5-French catheter was then advanced through the patient's mouth under fluoroscopy into the esophagus and to the level of the stomach. This catheter was used to insufflate the stomach with air under fluoroscopy. The abdominal wall was prepped with chlorhexidine in a sterile fashion, and a sterile drape was applied covering the operative field. A sterile gown and sterile gloves were used for the procedure. Local anesthesia was provided with 1% Lidocaine. A skin incision was made in the upper abdominal wall. Under fluoroscopy, an 18 gauge trocar needle was advanced into the stomach. Contrast injection was performed to confirm intraluminal position of the needle tip. A single T tack was then deployed in the lumen of the stomach. This was brought up to tension at the skin surface. A second T tack was then deployed in the lumen of the stomach utilizing similar technique approximately 3 cm from the first T tack. A 19 gauge needle was used to puncture the stomach under fluoroscopy between the 2 T tacks. Intraluminal positioning was confirmed by contrast injection. A guidewire was then advanced through the needle into the gastric lumen and the needle removed. The percutaneous tract was dilated over the wire. A peel-away sheath was placed. A 16 French balloon retention gastrostomy tube was then advanced through the peel-away sheath into the gastric lumen. The peel-away sheath was removed. The retention balloon of the gastrostomy tube was inflated with 6 mL of saline. The catheter was injected with contrast material to  confirm position and a fluoroscopic spot image saved. The tube was then flushed with saline. A dressing was applied over the gastrostomy exit site. COMPLICATIONS: None. FINDINGS: The stomach distended well with air allowing safe placement of the gastrostomy tube. After placement, the tip of the gastrostomy tube lies in the body of the stomach. IMPRESSION: Percutaneous gastrostomy with placement of a 60 French balloon retention tube in the body of the stomach. This tube can be used for percutaneous feeds beginning in 24 hours after placement. Electronically Signed   By: Aletta Edouard M.D.   On: 08/06/2019 17:26   Dg Chest Port 1 View  Result Date: 08/04/2019 CLINICAL DATA:  Pulmonary opacities EXAM: PORTABLE CHEST 1 VIEW COMPARISON:  07/31/2019 chest radiograph. FINDINGS: Low lung  volumes. Stable cardiomediastinal silhouette with normal heart size. No pneumothorax. No pleural effusion. Patchy right greater than left parahilar and left lung base opacities appear slightly worsened. IMPRESSION: 1. Low lung volumes. 2. Patchy right greater than left parahilar and left lung base opacities appear slightly worsened, compatible with pneumonia, aspiration and/or atelectasis. Chest radiograph follow-up advised. Electronically Signed   By: Ilona Sorrel M.D.   On: 08/04/2019 10:45   Dg Chest Port 1 View  Result Date: 07/31/2019 CLINICAL DATA:  Recent aspiration EXAM: PORTABLE CHEST 1 VIEW COMPARISON:  07/30/2019 FINDINGS: Cardiac shadow is stable. Aortic calcifications are again seen. Increased basilar density is noted bilaterally consistent with the given clinical history. No bony abnormality is seen. IMPRESSION: Bibasilar density consistent with the given clinical history of aspiration. Electronically Signed   By: Inez Catalina M.D.   On: 07/31/2019 13:21   Dg Chest Port 1 View  Result Date: 07/30/2019 CLINICAL DATA:  Cough EXAM: PORTABLE CHEST 1 VIEW COMPARISON:  07/28/2019 FINDINGS: Mild cardiomegaly. Both  lungs are clear. The visualized skeletal structures are unremarkable. IMPRESSION: Mild cardiomegaly without acute abnormality of the lungs in AP portable projection. Electronically Signed   By: Eddie Candle M.D.   On: 07/30/2019 21:50   Ct Maxillofacial Wo Contrast  Result Date: 08/14/2019 CLINICAL DATA:  Darrick Grinder evaluation for acute trauma, fall. EXAM: CT MAXILLOFACIAL WITHOUT CONTRAST TECHNIQUE: Multidetector CT imaging of the maxillofacial structures was performed. Multiplanar CT image reconstructions were also generated. COMPARISON:  None. FINDINGS: Osseous: Zygomatic arches intact. No acute maxillary fracture. Pterygoid plates intact. Nasal bones intact without fracture. Nasal septum intact. No acute mandibular fracture. Mandibular condyles normally situated. Dentition grossly intact. Orbits: Globes and orbital soft tissues within normal limits. Bony orbits intact. Sinuses: Mild mucoperiosteal thickening present within the ethmoidal air cells and right greater than left maxillary sinuses. Paranasal sinuses are otherwise clear. No hemosinus. Mastoid air cells and middle ear cavities are well pneumatized and free of fluid. Soft tissues: Soft tissue laceration present just to the left of midline at the upper lip. Superimposed radiopaque density could reflect debris/foreign body (series 3, image 59). Again, adjacent dentition grossly intact. No other visible soft tissue injury about the face. Limited intracranial: Unremarkable. IMPRESSION: 1. Soft tissue laceration just to the left of midline at the upper lip. Superimposed radiopaque density could reflect debris/foreign body. Adjacent dentition grossly intact. 2. No other acute maxillofacial injury identified.  No fracture. Electronically Signed   By: Jeannine Boga M.D.   On: 08/14/2019 22:55      PEG tube placement 08/17/2019  CT angiogram chest 07/28/2019  CT head CT maxillofacial 08/14/2019  Abdominal films 08/08/2019 08/18/2019  EGD with  benign esophageal stenosis with dilatation per Dr. Allen Norris 08/02/2019  Abdominal ultrasound 08/01/2019    Subjective: Patient seen and examined awake sitting up in bed working on his laptop.  Denies fevers or chills, chest pain, shortness of breath, nausea vomiting or diarrhea.  Is tolerating self tube feeds.  Reports ongoing anorectal pain and bowel incontinence.   Discharge Exam: Vitals:   08/29/19 0533 08/29/19 0836  BP: 90/71 119/83  Pulse: 86 92  Resp: 16 18  Temp: 98.4 F (36.9 C) 98.6 F (37 C)  SpO2: 98% 99%   Vitals:   08/28/19 1924 08/29/19 0500 08/29/19 0533 08/29/19 0836  BP: 100/81  90/71 119/83  Pulse: 80  86 92  Resp: 16  16 18   Temp: (!) 97.3 F (36.3 C)  98.4 F (36.9 C) 98.6 F (37  C)  TempSrc: Oral  Oral Oral  SpO2: 97%  98% 99%  Weight:  79.4 kg    Height:        General: Pt is alert, awake, not in acute distress Cardiovascular: RRR, S1/S2 +, no rubs, no gallops Respiratory: CTA bilaterally, no wheezing, no rhonchi Abdominal: Soft, NT, ND, bowel sounds +, PEG tube present Extremities: no edema, no cyanosis    The results of significant diagnostics from this hospitalization (including imaging, microbiology, ancillary and laboratory) are listed below for reference.     Microbiology: No results found for this or any previous visit (from the past 240 hour(s)).   Labs: BNP (last 3 results) Recent Labs    04/07/19 0458 05/15/19 1405 07/28/19 1040  BNP 528.0* 86.0 40.1   Basic Metabolic Panel: Recent Labs  Lab 08/23/19 0603 08/26/19 0507 08/27/19 0529 08/29/19 0434  NA 137  --  138 137  K 4.1  --  4.0 4.2  CL 102  --  104 104  CO2 27  --  26 28  GLUCOSE 132*  --  98 126*  BUN 9  --  10 12  CREATININE 0.40* 0.45* 0.42* 0.56*  CALCIUM 8.9  --  8.9 8.7*   Liver Function Tests: No results for input(s): AST, ALT, ALKPHOS, BILITOT, PROT, ALBUMIN in the last 168 hours. No results for input(s): LIPASE, AMYLASE in the last 168 hours. No  results for input(s): AMMONIA in the last 168 hours. CBC: Recent Labs  Lab 08/23/19 0603 08/26/19 0507 08/27/19 0529 08/29/19 0434  WBC  --  6.1 4.9 5.2  HGB 11.3* 10.7* 10.0* 9.2*  HCT 36.8* 36.0* 33.3* 29.9*  MCV  --  84.9 84.5 82.6  PLT  --  232 230 222   Cardiac Enzymes: No results for input(s): CKTOTAL, CKMB, CKMBINDEX, TROPONINI in the last 168 hours. BNP: Invalid input(s): POCBNP CBG: No results for input(s): GLUCAP in the last 168 hours. D-Dimer No results for input(s): DDIMER in the last 72 hours. Hgb A1c No results for input(s): HGBA1C in the last 72 hours. Lipid Profile No results for input(s): CHOL, HDL, LDLCALC, TRIG, CHOLHDL, LDLDIRECT in the last 72 hours. Thyroid function studies No results for input(s): TSH, T4TOTAL, T3FREE, THYROIDAB in the last 72 hours.  Invalid input(s): FREET3 Anemia work up No results for input(s): VITAMINB12, FOLATE, FERRITIN, TIBC, IRON, RETICCTPCT in the last 72 hours. Urinalysis    Component Value Date/Time   COLORURINE YELLOW (A) 08/11/2019 1602   APPEARANCEUR CLEAR (A) 08/11/2019 1602   LABSPEC 1.008 08/11/2019 1602   PHURINE 7.0 08/11/2019 1602   GLUCOSEU NEGATIVE 08/11/2019 1602   HGBUR NEGATIVE 08/11/2019 1602   BILIRUBINUR NEGATIVE 08/11/2019 1602   KETONESUR 20 (A) 08/11/2019 1602   PROTEINUR NEGATIVE 08/11/2019 1602   NITRITE NEGATIVE 08/11/2019 1602   LEUKOCYTESUR NEGATIVE 08/11/2019 1602   Sepsis Labs Invalid input(s): PROCALCITONIN,  WBC,  LACTICIDVEN Microbiology No results found for this or any previous visit (from the past 240 hour(s)).   Time coordinating discharge: Over 30 minutes  SIGNED:   Ezekiel Slocumb, DO [facility] 08/29/2019, 2:47 PM Pager [phone]  If 7PM-7AM, please contact night-coverage [email or web address] Password [email or web address]

## 2019-08-29 NOTE — TOC Transition Note (Signed)
Transition of Care [facility]) - CM/SW Discharge Note   Patient Details  Name: George Wade. MRN: [id number] Date of Birth: 27-Oct-1955  Transition of Care [facility]) CM/SW Contact:  Candie Chroman, LCSW Phone Number: 08/29/2019, 4:08 PM   Clinical Narrative: Patient's rolling walker, tube feed formula, and medications have been delivered. The valet car will take him to his truck. No further concerns. CSW signing off.    Final next level of care: Other (comment)(Hotel) Barriers to Discharge: Barriers Resolved   Patient Goals and CMS Choice        Discharge Placement                Patient to be transferred to facility by: Patient has his own vehicle   Patient and family notified of of transfer: 08/29/19  Discharge Plan and Services                DME Arranged: Tube feeding, Walker rolling DME Agency: [employer] Date DME Agency Contacted: 08/29/19   Representative spoke with at DME Agency: [place] Determinants of Health ([place]) Interventions     Readmission Risk Interventions No flowsheet data found.

## 2019-08-29 NOTE — TOC Progression Note (Addendum)
Transition of Care (TOC) - Progression Note    Patient Details  Name: George Wade. MRN: [id number] Date of Birth: August 16, 1956  Transition of Care [facility]) CM/SW [place], LCSW Phone Number: 08/29/2019, 9:08 AM  Clinical Narrative: CSW met with patient. He was completing his tube feed on his own. He stated that it has gotten easier now and is taking his time to do it slowly. Patient is aware he will not be transferring to [facility]. The [employer] has agreed to pay for patient to stay at the [facility] [place] for 7 days. Discussed what all the hospital and charitable fund would be covering that patient would be financially responsible after that. Patient stated that he feels caught off guard and needs time to thing about this information. CSW will follow up later.    11:43 am: CSW, [employer] So[employer] scientist, and MD met with patient to discuss plan for discharge today. Faxed Eliquis prescription to outpatient pharmacy and pharmacist gave them the 30-day free card information. Faxed the rest of his prescriptions to [facility]. Told patient they probably wouldn't be able to provide his pain meds but he could take them elsewhere.  3:00 pm: Gave patient goodrx coupon for pain medication. Unable to get him an appt any sooner than 12/9. Gave him [facility] application and told him he can go here as long as he does not have insurance. He will call them once he has completed the intake paperwork. [employer]. Rolling walker has been delivered. Medication management unable to fill Protonix solution so it was switched to Omeprazole. CSW will go pick up prescriptions when ready.  3:48 pm: Patient's medications have been delivered. Gave him mapquest directions to [employer] and the hotel. [employer] said tube feed formula will be delivered here to the hospital, not the hotel. Representative is checking status.      Expected Discharge Plan and Services                                               Social Determinants of Health (SDOH) Interventions    Readmission Risk Interventions No flowsheet data found.

## 2019-09-03 ENCOUNTER — Emergency Department: Payer: Self-pay

## 2019-09-03 ENCOUNTER — Encounter: Payer: Self-pay | Admitting: Emergency Medicine

## 2019-09-03 ENCOUNTER — Other Ambulatory Visit: Payer: Self-pay

## 2019-09-03 ENCOUNTER — Emergency Department
Admission: EM | Admit: 2019-09-03 | Discharge: 2019-09-03 | Disposition: A | Discharge status: Home / Self Care | Payer: Self-pay | Attending: Emergency Medicine | Admitting: Emergency Medicine

## 2019-09-03 ENCOUNTER — Emergency Department
Admission: EM | Admit: 2019-09-03 | Discharge: 2019-09-03 | Disposition: A | Discharge status: Other Acute Inpatient Hospital | Payer: Self-pay | Attending: Emergency Medicine | Admitting: Emergency Medicine

## 2019-09-03 ENCOUNTER — Inpatient Hospital Stay (HOSPITAL_COMMUNITY)
Admit: 2019-09-03 | Discharge: 2019-11-03 | DRG: 326 | Disposition: A | Discharge status: Skilled Nursing Facility | Payer: Self-pay | Source: Other Acute Inpatient Hospital | Attending: Thoracic Surgery (Cardiothoracic Vascular Surgery) | Admitting: Thoracic Surgery (Cardiothoracic Vascular Surgery)

## 2019-09-03 ENCOUNTER — Encounter (HOSPITAL_COMMUNITY)
Disposition: A | Discharge status: Skilled Nursing Facility | Payer: Self-pay | Source: Other Acute Inpatient Hospital | Attending: Thoracic Surgery (Cardiothoracic Vascular Surgery)

## 2019-09-03 DIAGNOSIS — Z96659 Presence of unspecified artificial knee joint: Secondary | ICD-10-CM | POA: Insufficient documentation

## 2019-09-03 DIAGNOSIS — Z8701 Personal history of pneumonia (recurrent): Secondary | ICD-10-CM

## 2019-09-03 DIAGNOSIS — R066 Hiccough: Secondary | ICD-10-CM | POA: Diagnosis not present

## 2019-09-03 DIAGNOSIS — E876 Hypokalemia: Secondary | ICD-10-CM | POA: Diagnosis not present

## 2019-09-03 DIAGNOSIS — Z6823 Body mass index (BMI) 23.0-23.9, adult: Secondary | ICD-10-CM

## 2019-09-03 DIAGNOSIS — F29 Unspecified psychosis not due to a substance or known physiological condition: Secondary | ICD-10-CM | POA: Diagnosis not present

## 2019-09-03 DIAGNOSIS — Z85818 Personal history of malignant neoplasm of other sites of lip, oral cavity, and pharynx: Secondary | ICD-10-CM | POA: Insufficient documentation

## 2019-09-03 DIAGNOSIS — K224 Dyskinesia of esophagus: Secondary | ICD-10-CM | POA: Diagnosis not present

## 2019-09-03 DIAGNOSIS — J9601 Acute respiratory failure with hypoxia: Secondary | ICD-10-CM | POA: Diagnosis not present

## 2019-09-03 DIAGNOSIS — R197 Diarrhea, unspecified: Secondary | ICD-10-CM | POA: Diagnosis not present

## 2019-09-03 DIAGNOSIS — K3189 Other diseases of stomach and duodenum: Secondary | ICD-10-CM | POA: Diagnosis present

## 2019-09-03 DIAGNOSIS — Z5900 Homelessness unspecified: Secondary | ICD-10-CM

## 2019-09-03 DIAGNOSIS — Z8249 Family history of ischemic heart disease and other diseases of the circulatory system: Secondary | ICD-10-CM

## 2019-09-03 DIAGNOSIS — Z931 Gastrostomy status: Secondary | ICD-10-CM

## 2019-09-03 DIAGNOSIS — Z23 Encounter for immunization: Secondary | ICD-10-CM

## 2019-09-03 DIAGNOSIS — R6521 Severe sepsis with septic shock: Secondary | ICD-10-CM | POA: Diagnosis not present

## 2019-09-03 DIAGNOSIS — K649 Unspecified hemorrhoids: Secondary | ICD-10-CM | POA: Diagnosis present

## 2019-09-03 DIAGNOSIS — K223 Perforation of esophagus: Secondary | ICD-10-CM

## 2019-09-03 DIAGNOSIS — K9189 Other postprocedural complications and disorders of digestive system: Secondary | ICD-10-CM | POA: Diagnosis not present

## 2019-09-03 DIAGNOSIS — R0602 Shortness of breath: Secondary | ICD-10-CM

## 2019-09-03 DIAGNOSIS — Z560 Unemployment, unspecified: Secondary | ICD-10-CM

## 2019-09-03 DIAGNOSIS — Y939 Activity, unspecified: Secondary | ICD-10-CM | POA: Insufficient documentation

## 2019-09-03 DIAGNOSIS — W19XXXA Unspecified fall, initial encounter: Secondary | ICD-10-CM | POA: Insufficient documentation

## 2019-09-03 DIAGNOSIS — M549 Dorsalgia, unspecified: Secondary | ICD-10-CM | POA: Diagnosis not present

## 2019-09-03 DIAGNOSIS — Y828 Other medical devices associated with adverse incidents: Secondary | ICD-10-CM | POA: Diagnosis not present

## 2019-09-03 DIAGNOSIS — Z9889 Other specified postprocedural states: Secondary | ICD-10-CM

## 2019-09-03 DIAGNOSIS — K209 Esophagitis, unspecified without bleeding: Secondary | ICD-10-CM | POA: Diagnosis not present

## 2019-09-03 DIAGNOSIS — A419 Sepsis, unspecified organism: Secondary | ICD-10-CM | POA: Diagnosis not present

## 2019-09-03 DIAGNOSIS — T797XXA Traumatic subcutaneous emphysema, initial encounter: Secondary | ICD-10-CM | POA: Diagnosis present

## 2019-09-03 DIAGNOSIS — Z86718 Personal history of other venous thrombosis and embolism: Secondary | ICD-10-CM

## 2019-09-03 DIAGNOSIS — I10 Essential (primary) hypertension: Secondary | ICD-10-CM | POA: Insufficient documentation

## 2019-09-03 DIAGNOSIS — J9811 Atelectasis: Secondary | ICD-10-CM | POA: Diagnosis not present

## 2019-09-03 DIAGNOSIS — E7800 Pure hypercholesterolemia, unspecified: Secondary | ICD-10-CM | POA: Diagnosis present

## 2019-09-03 DIAGNOSIS — Z7901 Long term (current) use of anticoagulants: Secondary | ICD-10-CM

## 2019-09-03 DIAGNOSIS — R1011 Right upper quadrant pain: Secondary | ICD-10-CM

## 2019-09-03 DIAGNOSIS — F10229 Alcohol dependence with intoxication, unspecified: Secondary | ICD-10-CM | POA: Diagnosis present

## 2019-09-03 DIAGNOSIS — Z20822 Contact with and (suspected) exposure to covid-19: Secondary | ICD-10-CM | POA: Diagnosis present

## 2019-09-03 DIAGNOSIS — F1010 Alcohol abuse, uncomplicated: Secondary | ICD-10-CM | POA: Insufficient documentation

## 2019-09-03 DIAGNOSIS — Z8572 Personal history of non-Hodgkin lymphomas: Secondary | ICD-10-CM

## 2019-09-03 DIAGNOSIS — Z96652 Presence of left artificial knee joint: Secondary | ICD-10-CM | POA: Insufficient documentation

## 2019-09-03 DIAGNOSIS — Z79899 Other long term (current) drug therapy: Secondary | ICD-10-CM

## 2019-09-03 DIAGNOSIS — I2699 Other pulmonary embolism without acute cor pulmonale: Secondary | ICD-10-CM | POA: Diagnosis present

## 2019-09-03 DIAGNOSIS — R509 Fever, unspecified: Secondary | ICD-10-CM | POA: Diagnosis not present

## 2019-09-03 DIAGNOSIS — Z20828 Contact with and (suspected) exposure to other viral communicable diseases: Secondary | ICD-10-CM | POA: Insufficient documentation

## 2019-09-03 DIAGNOSIS — Z09 Encounter for follow-up examination after completed treatment for conditions other than malignant neoplasm: Secondary | ICD-10-CM

## 2019-09-03 DIAGNOSIS — F10239 Alcohol dependence with withdrawal, unspecified: Secondary | ICD-10-CM | POA: Diagnosis not present

## 2019-09-03 DIAGNOSIS — K449 Diaphragmatic hernia without obstruction or gangrene: Secondary | ICD-10-CM | POA: Diagnosis present

## 2019-09-03 DIAGNOSIS — R000 Tachycardia, unspecified: Secondary | ICD-10-CM | POA: Diagnosis not present

## 2019-09-03 DIAGNOSIS — K921 Melena: Secondary | ICD-10-CM | POA: Diagnosis not present

## 2019-09-03 DIAGNOSIS — D62 Acute posthemorrhagic anemia: Secondary | ICD-10-CM | POA: Diagnosis not present

## 2019-09-03 DIAGNOSIS — I959 Hypotension, unspecified: Secondary | ICD-10-CM | POA: Diagnosis not present

## 2019-09-03 DIAGNOSIS — A630 Anogenital (venereal) warts: Secondary | ICD-10-CM | POA: Diagnosis present

## 2019-09-03 DIAGNOSIS — Z809 Family history of malignant neoplasm, unspecified: Secondary | ICD-10-CM

## 2019-09-03 DIAGNOSIS — Z96651 Presence of right artificial knee joint: Secondary | ICD-10-CM | POA: Insufficient documentation

## 2019-09-03 DIAGNOSIS — Z882 Allergy status to sulfonamides status: Secondary | ICD-10-CM

## 2019-09-03 DIAGNOSIS — E440 Moderate protein-calorie malnutrition: Secondary | ICD-10-CM | POA: Diagnosis present

## 2019-09-03 DIAGNOSIS — J948 Other specified pleural conditions: Secondary | ICD-10-CM | POA: Diagnosis present

## 2019-09-03 DIAGNOSIS — J90 Pleural effusion, not elsewhere classified: Secondary | ICD-10-CM

## 2019-09-03 DIAGNOSIS — Y998 Other external cause status: Secondary | ICD-10-CM | POA: Insufficient documentation

## 2019-09-03 DIAGNOSIS — E785 Hyperlipidemia, unspecified: Secondary | ICD-10-CM | POA: Diagnosis present

## 2019-09-03 DIAGNOSIS — Z96653 Presence of artificial knee joint, bilateral: Secondary | ICD-10-CM | POA: Diagnosis present

## 2019-09-03 DIAGNOSIS — R0789 Other chest pain: Secondary | ICD-10-CM | POA: Insufficient documentation

## 2019-09-03 DIAGNOSIS — Y929 Unspecified place or not applicable: Secondary | ICD-10-CM | POA: Insufficient documentation

## 2019-09-03 DIAGNOSIS — Z9981 Dependence on supplemental oxygen: Secondary | ICD-10-CM

## 2019-09-03 DIAGNOSIS — K222 Esophageal obstruction: Secondary | ICD-10-CM | POA: Diagnosis present

## 2019-09-03 DIAGNOSIS — R32 Unspecified urinary incontinence: Secondary | ICD-10-CM | POA: Diagnosis not present

## 2019-09-03 DIAGNOSIS — R1310 Dysphagia, unspecified: Secondary | ICD-10-CM | POA: Diagnosis not present

## 2019-09-03 DIAGNOSIS — F1910 Other psychoactive substance abuse, uncomplicated: Secondary | ICD-10-CM | POA: Diagnosis present

## 2019-09-03 DIAGNOSIS — J9851 Mediastinitis: Secondary | ICD-10-CM | POA: Diagnosis present

## 2019-09-03 DIAGNOSIS — G8929 Other chronic pain: Secondary | ICD-10-CM | POA: Diagnosis not present

## 2019-09-03 DIAGNOSIS — N529 Male erectile dysfunction, unspecified: Secondary | ICD-10-CM | POA: Diagnosis present

## 2019-09-03 DIAGNOSIS — J939 Pneumothorax, unspecified: Secondary | ICD-10-CM | POA: Diagnosis not present

## 2019-09-03 DIAGNOSIS — R159 Full incontinence of feces: Secondary | ICD-10-CM | POA: Diagnosis not present

## 2019-09-03 DIAGNOSIS — Y839 Surgical procedure, unspecified as the cause of abnormal reaction of the patient, or of later complication, without mention of misadventure at the time of the procedure: Secondary | ICD-10-CM | POA: Diagnosis not present

## 2019-09-03 HISTORY — PX: THORACOTOMY: SHX5074

## 2019-09-03 LAB — COMPREHENSIVE METABOLIC PANEL
ALT: 36 U/L (ref 0–44)
ALT: 37 U/L (ref 0–44)
AST: 43 U/L — ABNORMAL HIGH (ref 15–41)
AST: 62 U/L — ABNORMAL HIGH (ref 15–41)
Albumin: 3.4 g/dL — ABNORMAL LOW (ref 3.5–5.0)
Albumin: 3.6 g/dL (ref 3.5–5.0)
Alkaline Phosphatase: 129 U/L — ABNORMAL HIGH (ref 38–126)
Alkaline Phosphatase: 139 U/L — ABNORMAL HIGH (ref 38–126)
Anion gap: 19 — ABNORMAL HIGH (ref 5–15)
Anion gap: 31 — ABNORMAL HIGH (ref 5–15)
BUN: 11 mg/dL (ref 8–23)
BUN: 14 mg/dL (ref 8–23)
CO2: 20 mmol/L — ABNORMAL LOW (ref 22–32)
CO2: 12 mmol/L — ABNORMAL LOW (ref 22–32)
Calcium: 8.8 mg/dL — ABNORMAL LOW (ref 8.9–10.3)
Calcium: 9.3 mg/dL (ref 8.9–10.3)
Chloride: 98 mmol/L (ref 98–111)
Chloride: 93 mmol/L — ABNORMAL LOW (ref 98–111)
Creatinine, Ser: 0.45 mg/dL — ABNORMAL LOW (ref 0.61–1.24)
Creatinine, Ser: 0.98 mg/dL (ref 0.61–1.24)
GFR calc Af Amer: >60 mL/min (ref >60)
GFR calc Af Amer: >60 mL/min (ref >60)
GFR calc non Af Amer: >60 mL/min (ref >60)
GFR calc non Af Amer: >60 mL/min (ref >60)
Glucose, Bld: 109 mg/dL — ABNORMAL HIGH (ref 70–99)
Glucose, Bld: 183 mg/dL — ABNORMAL HIGH (ref 70–99)
Potassium: 3.9 mmol/L (ref 3.5–5.1)
Potassium: 4.1 mmol/L (ref 3.5–5.1)
Sodium: 137 mmol/L (ref 135–145)
Sodium: 136 mmol/L (ref 135–145)
Total Bilirubin: 0.7 mg/dL (ref 0.3–1.2)
Total Bilirubin: 1.2 mg/dL (ref 0.3–1.2)
Total Protein: 7.1 g/dL (ref 6.5–8.1)
Total Protein: 7.3 g/dL (ref 6.5–8.1)

## 2019-09-03 LAB — LACTIC ACID, PLASMA: Lactic Acid, Venous: 10.5 mmol/L (ref 0.5–1.9)

## 2019-09-03 LAB — CBC WITH DIFFERENTIAL/PLATELET
Abs Immature Granulocytes: 0.31 10*3/uL — ABNORMAL HIGH (ref 0.00–0.07)
Basophils Absolute: 0.1 10*3/uL (ref 0.0–0.1)
Basophils Relative: 0 %
Eosinophils Absolute: 0 10*3/uL (ref 0.0–0.5)
Eosinophils Relative: 0 %
HCT: 42.4 % (ref 39.0–52.0)
Hemoglobin: 13.4 g/dL (ref 13.0–17.0)
Immature Granulocytes: 1 %
Lymphocytes Relative: 4 %
Lymphs Abs: 1.1 10*3/uL (ref 0.7–4.0)
MCH: 26.3 pg (ref 26.0–34.0)
MCHC: 31.6 g/dL (ref 30.0–36.0)
MCV: 83.1 fL (ref 80.0–100.0)
Monocytes Absolute: 2.2 10*3/uL — ABNORMAL HIGH (ref 0.1–1.0)
Monocytes Relative: 7 %
Neutro Abs: 27.8 10*3/uL — ABNORMAL HIGH (ref 1.7–7.7)
Neutrophils Relative %: 88 %
Platelets: 569 10*3/uL — ABNORMAL HIGH (ref 150–400)
RBC: 5.1 MIL/uL (ref 4.22–5.81)
RDW: 21.9 % — ABNORMAL HIGH (ref 11.5–15.5)
WBC: 31.5 10*3/uL — ABNORMAL HIGH (ref 4.0–10.5)
nRBC: 0 % (ref 0.0–0.2)

## 2019-09-03 LAB — CBC
HCT: 39.2 % (ref 39.0–52.0)
Hemoglobin: 12.5 g/dL — ABNORMAL LOW (ref 13.0–17.0)
MCH: 25.3 pg — ABNORMAL LOW (ref 26.0–34.0)
MCHC: 31.9 g/dL (ref 30.0–36.0)
MCV: 79.4 fL — ABNORMAL LOW (ref 80.0–100.0)
Platelets: 488 10*3/uL — ABNORMAL HIGH (ref 150–400)
RBC: 4.94 MIL/uL (ref 4.22–5.81)
RDW: 21.9 % — ABNORMAL HIGH (ref 11.5–15.5)
WBC: 12.4 10*3/uL — ABNORMAL HIGH (ref 4.0–10.5)
nRBC: 0 % (ref 0.0–0.2)

## 2019-09-03 LAB — POC SARS CORONAVIRUS 2 AG -  ED: SARS Coronavirus 2 Ag: NEGATIVE

## 2019-09-03 LAB — LIPASE, BLOOD: Lipase: 22 U/L (ref 11–51)

## 2019-09-03 LAB — SALICYLATE LEVEL: Salicylate Lvl: <7 mg/dL (ref 2.8–30.0)

## 2019-09-03 LAB — ACETAMINOPHEN LEVEL: Acetaminophen (Tylenol), Serum: <10 ug/mL — ABNORMAL LOW (ref 10–30)

## 2019-09-03 LAB — ETHANOL: Alcohol, Ethyl (B): 324 mg/dL (ref <10)

## 2019-09-03 SURGERY — THORACOTOMY, MAJOR
Anesthesia: General | Site: Chest | Laterality: Right

## 2019-09-03 MED ORDER — VANCOMYCIN HCL 10 G IV SOLR
1750.0000 mg | Freq: Once | INTRAVENOUS | Status: DC
  Filled 2019-09-03: qty 1750

## 2019-09-03 MED ORDER — SODIUM CHLORIDE 0.9 % IV BOLUS: 500.0000 mL | Freq: Once | INTRAVENOUS | Status: DC

## 2019-09-03 MED ORDER — MIDAZOLAM HCL 2 MG/2ML IJ SOLN
INTRAMUSCULAR | Status: AC
  Filled 2019-09-03: qty 2

## 2019-09-03 MED ORDER — FENTANYL CITRATE (PF) 250 MCG/5ML IJ SOLN
INTRAMUSCULAR | Status: AC
  Filled 2019-09-03: qty 5

## 2019-09-03 MED ORDER — SODIUM CHLORIDE 0.9 % IV BOLUS
1000.0000 mL | Freq: Once | INTRAVENOUS | Status: AC
  Administered 2019-09-03: 1000 mL via INTRAVENOUS

## 2019-09-03 MED ORDER — PROPOFOL 10 MG/ML IV BOLUS
INTRAVENOUS | Status: AC
  Filled 2019-09-03: qty 40

## 2019-09-03 MED ORDER — MORPHINE SULFATE (PF) 4 MG/ML IV SOLN
INTRAVENOUS | Status: AC
  Administered 2019-09-03: 23:00:00
  Filled 2019-09-03: qty 1

## 2019-09-03 MED ORDER — PANTOPRAZOLE SODIUM 40 MG IV SOLR
40.0000 mg | Freq: Once | INTRAVENOUS | Status: AC
  Administered 2019-09-03: 40 mg via INTRAVENOUS
  Filled 2019-09-03: qty 40

## 2019-09-03 MED ORDER — PIPERACILLIN-TAZOBACTAM 3.375 G IVPB 30 MIN
3.3750 g | Freq: Once | INTRAVENOUS | Status: AC
  Administered 2019-09-03: 3.375 g via INTRAVENOUS
  Filled 2019-09-03: qty 50

## 2019-09-03 MED ORDER — MORPHINE SULFATE (PF) 4 MG/ML IV SOLN
4.0000 mg | Freq: Once | INTRAVENOUS | Status: AC
  Administered 2019-09-03: 4 mg via INTRAVENOUS
  Filled 2019-09-03: qty 1

## 2019-09-03 MED ORDER — ONDANSETRON HCL 4 MG/2ML IJ SOLN
4.0000 mg | Freq: Once | INTRAMUSCULAR | Status: AC
  Administered 2019-09-03: 4 mg via INTRAVENOUS
  Filled 2019-09-03: qty 2

## 2019-09-03 MED ORDER — SODIUM CHLORIDE 0.9 % IV BOLUS: 1000.0000 mL | Freq: Once | INTRAVENOUS | Status: DC

## 2019-09-03 MED ORDER — FENTANYL CITRATE (PF) 100 MCG/2ML IJ SOLN
50.0000 ug | Freq: Once | INTRAMUSCULAR | Status: AC
  Administered 2019-09-03: 50 ug via INTRAVENOUS
  Filled 2019-09-03: qty 2

## 2019-09-03 MED ORDER — IOHEXOL 300 MG/ML  SOLN
100.0000 mL | Freq: Once | INTRAMUSCULAR | Status: AC | PRN
  Administered 2019-09-03: 100 mL via INTRAVENOUS

## 2019-09-03 SURGICAL SUPPLY — 65 items
BAG URINE LEG 500ML (DRAIN) ×2 IMPLANT
BIT DRILL 7/64X5 DISP (BIT) ×2 IMPLANT
BLADE CLIPPER SURG (BLADE) ×3 IMPLANT
CANISTER SUCT 3000ML PPV (MISCELLANEOUS) ×6 IMPLANT
CLIP VESOCCLUDE MED 24/CT (CLIP) ×2 IMPLANT
CLIP VESOCCLUDE MED 6/CT (CLIP) ×3 IMPLANT
CONN ST 1/4X3/8  BEN (MISCELLANEOUS) ×4
CONN ST 1/4X3/8 BEN (MISCELLANEOUS) IMPLANT
CONN Y 3/8X3/8X3/8  BEN (MISCELLANEOUS) ×2
CONN Y 3/8X3/8X3/8 BEN (MISCELLANEOUS) IMPLANT
CONT SPEC 4OZ CLIKSEAL STRL BL (MISCELLANEOUS) ×10 IMPLANT
DRAIN CONNECTOR BLAKE 1:1 (MISCELLANEOUS) ×2 IMPLANT
DRAIN PENROSE 1/2X36 LTX STRL (DRAIN) ×2 IMPLANT
DRAPE SURG 17X23 STRL (DRAPES) ×2 IMPLANT
DRAPE WARM FLUID 44X44 (DRAPES) ×3 IMPLANT
ELECT BLADE 6.5 EXT (BLADE) ×3 IMPLANT
ELECT REM PT RETURN 9FT ADLT (ELECTROSURGICAL) ×3
ELECTRODE REM PT RTRN 9FT ADLT (ELECTROSURGICAL) ×1 IMPLANT
FELT TEFLON 1X6 (MISCELLANEOUS) ×2 IMPLANT
GAUZE SPONGE 4X4 12PLY STRL (GAUZE/BANDAGES/DRESSINGS) ×3 IMPLANT
GLOVE BIOGEL PI IND STRL 6.5 (GLOVE) IMPLANT
GLOVE BIOGEL PI INDICATOR 6.5 (GLOVE) ×2
GLOVE SURG SIGNA 7.5 PF LTX (GLOVE) ×9 IMPLANT
GLOVE SURG SS PI 7.5 STRL IVOR (GLOVE) ×2 IMPLANT
GOWN STRL REUS W/ TWL LRG LVL3 (GOWN DISPOSABLE) ×2 IMPLANT
GOWN STRL REUS W/ TWL XL LVL3 (GOWN DISPOSABLE) ×1 IMPLANT
GOWN STRL REUS W/TWL LRG LVL3 (GOWN DISPOSABLE) ×6
GOWN STRL REUS W/TWL XL LVL3 (GOWN DISPOSABLE) ×2
KIT BASIN OR (CUSTOM PROCEDURE TRAY) ×3 IMPLANT
KIT SUCTION CATH 14FR (SUCTIONS) ×3 IMPLANT
KIT TURNOVER KIT B (KITS) ×3 IMPLANT
NS IRRIG 1000ML POUR BTL (IV SOLUTION) ×9 IMPLANT
PACK CHEST (CUSTOM PROCEDURE TRAY) ×3 IMPLANT
PAD ARMBOARD 7.5X6 YLW CONV (MISCELLANEOUS) ×6 IMPLANT
RELOAD STAPLE 45 GOLD REG/THCK (STAPLE) IMPLANT
SOL ANTI FOG 6CC (MISCELLANEOUS) ×1 IMPLANT
SOLUTION ANTI FOG 6CC (MISCELLANEOUS) ×2
SPONGE INTESTINAL PEANUT (DISPOSABLE) ×2 IMPLANT
SPONGE TONSIL TAPE 1 RFD (DISPOSABLE) ×3 IMPLANT
STAPLE RELOAD 45MM GOLD (STAPLE) ×3 IMPLANT
STAPLER ECHELON POWERED (MISCELLANEOUS) ×2 IMPLANT
STOPCOCK 4 WAY LG BORE MALE ST (IV SETS) ×3 IMPLANT
SUT PDS AB 3-0 SH 27 (SUTURE) ×10 IMPLANT
SUT SILK  1 MH (SUTURE) ×6
SUT SILK 1 MH (SUTURE) ×2 IMPLANT
SUT SILK 2 0SH CR/8 30 (SUTURE) ×6 IMPLANT
SUT VIC AB 1 CTX 36 (SUTURE) ×4
SUT VIC AB 1 CTX36XBRD ANBCTR (SUTURE) IMPLANT
SUT VIC AB 2-0 CT1 27 (SUTURE) ×2
SUT VIC AB 2-0 CT1 TAPERPNT 27 (SUTURE) IMPLANT
SUT VIC AB 2-0 CTX 36 (SUTURE) ×2 IMPLANT
SUT VIC AB 3-0 X1 27 (SUTURE) ×8 IMPLANT
SUT VICRYL 0 UR6 27IN ABS (SUTURE) ×3 IMPLANT
SUT VICRYL 2 TP 1 (SUTURE) ×2 IMPLANT
SYR 10ML LL (SYRINGE) ×3 IMPLANT
SYR 50ML LL SCALE MARK (SYRINGE) ×3 IMPLANT
SYSTEM SAHARA CHEST DRAIN ATS (WOUND CARE) ×3 IMPLANT
TAPE CLOTH SURG 4X10 WHT LF (GAUZE/BANDAGES/DRESSINGS) ×2 IMPLANT
TOWEL GREEN STERILE (TOWEL DISPOSABLE) ×3 IMPLANT
TOWEL GREEN STERILE FF (TOWEL DISPOSABLE) ×3 IMPLANT
TRAP SPECIMEN MUCOUS 40CC (MISCELLANEOUS) ×2 IMPLANT
TRAY FOLEY SLVR 16FR LF STAT (SET/KITS/TRAYS/PACK) ×3 IMPLANT
TUBE CONNECTING 20'X1/4 (TUBING) ×1
TUBE CONNECTING 20X1/4 (TUBING) ×1 IMPLANT
WATER STERILE IRR 1000ML POUR (IV SOLUTION) ×3 IMPLANT

## 2019-09-03 NOTE — ED Notes (Signed)
Report given to [facility] OR, informed of patient's suicidal thoughts.

## 2019-09-03 NOTE — ED Notes (Signed)
Lab says 10 minutes until blood results

## 2019-09-03 NOTE — Progress Notes (Signed)
PHARMACY -  BRIEF ANTIBIOTIC NOTE   Pharmacy has received consult(s) for Vancomycin from an ED provider.  The patient's profile has been reviewed for ht/wt/allergies/indication/available labs.    One time order(s) placed for Vancomycin 1750 mg IV  X 1   Further antibiotics/pharmacy consults should be ordered by admitting physician if indicated.                       Thank you, Bird Swetz D 09/03/2019  10:48 PM

## 2019-09-03 NOTE — ED Provider Notes (Signed)
[facility] Emergency Department Provider Note   ____________________________________________   First MD Initiated Contact with Patient 09/03/19 1208     (approximate)  I have reviewed the triage vital signs and the nursing notes.   HISTORY  Chief Complaint Homeless    HPI Sanad Fearnow. is a 63 y.o. male with past medical history of alcohol abuse, PE on Eliquis, esophageal stenosis status post PEG tube, and anal condyloma presents to the ED for homelessness.  Patient states he is not sure why he is here and is unable to provide significant history.  Per EMS, [place].  Police have been called by hotel staff as patient was found naked in his room with what appeared to be feces around the room.  He appeared intoxicated and was making repeated demands of staff.  Patient admits to ongoing alcohol consumption with his last drink being last night.  He states that he was kicked out of the hotel and can no longer go back there and has nowhere to go.  He complains only of pain along his left chest wall, present after he fell last night.  States his PEG tube has been functioning well and that material smeared in his room was tube feed rather than feces.  He has not noticed any blood in his stool or rectal bleeding recently.        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[facility])     Patient Active Problem List   Diagnosis Date Noted   Anal condylomata    Rectal bleeding 08/20/2019   Rectal mass 08/20/2019   PEG (percutaneous endoscopic gastrostomy) status ([place])    Aspiration into  airway    Fall 08/15/2019   Elevated LFTs    Esophageal stricture    Problems with swallowing and mastication    Stricture and stenosis of esophagus    Malnutrition of moderate degree 07/31/2019   Nausea & vomiting    Pulmonary emboli ([place]) 07/28/2019   Alcohol-induced mood disorder ([place]) 07/28/2019   Alcohol dependence with uncomplicated intoxication ([place])    Acute respiratory failure with hypoxia ([place]) 05/15/2019   Wernicke's encephalopathy 04/15/2019   GI bleed 03/31/2019   Alcohol withdrawal ([place]) 12/09/2017    Past Surgical History:  Procedure Laterality Date   ESOPHAGOGASTRODUODENOSCOPY N/A 05/18/2019   Procedure: ESOPHAGOGASTRODUODENOSCOPY (EGD);  Surgeon: Lin Landsman, MD;  [facility] ENDOSCOPY;  Service: Gastroenterology;  Laterality: N/A;   ESOPHAGOGASTRODUODENOSCOPY (EGD) WITH PROPOFOL N/A 08/02/2019   Procedure: ESOPHAGOGASTRODUODENOSCOPY (EGD) WITH PROPOFOL;  Surgeon: [name], MD;  Location: [facility] ENDOSCOPY;  Service: Endoscopy;  Laterality: N/A;   HERNIA REPAIR  1958   IR GASTROSTOMY TUBE MOD SED  08/06/2019   IR GASTROSTOMY TUBE MOD SED  08/17/2019   JOINT REPLACEMENT     REPLACEMENT TOTAL KNEE BILATERAL  2009   TONSILLECTOMY  2008   with neck dissection     Prior to Admission medications   Medication Sig Start Date End Date Taking? Authorizing Provider  acetaminophen (TYLENOL) 325 MG tablet Take 2 tablets (650 mg  total) by mouth every 6 (six) hours as needed for mild pain (or Fever >/= 101). 08/29/19   Ezekiel Slocumb, DO  apixaban (ELIQUIS) 5 MG TABS tablet Place 1 tablet (5 mg total) into feeding tube 2 (two) times daily. 08/29/19   Ezekiel Slocumb, DO  folic acid (FOLVITE) 1 MG tablet Place 1 tablet (1 mg total) into feeding tube daily. 08/30/19   Ezekiel Slocumb, DO  HYDROcodone-acetaminophen (NORCO/VICODIN) 5-325 MG tablet Take 1-2 tablets by mouth every 4 (four) hours as needed for up to 5 days for moderate pain.  08/29/19 09/03/19  Nicole Kindred A, DO  hydrocortisone cream 1 % Apply topically every 6 (six) hours as needed for itching. 08/29/19   Ezekiel Slocumb, DO  Nutritional Supplements (FEEDING SUPPLEMENT, OSMOLITE 1.5 CAL,) LIQD Place 474 mLs into feeding tube 3 (three) times daily. 08/29/19 09/28/19  Ezekiel Slocumb, DO  omeprazole (PRILOSEC) 20 MG capsule Take 1 capsule (20 mg total) by mouth 2 (two) times daily before a meal. Open capsule and empty granules into small amount of liquid.  Give by PEG tube twice daily. 08/29/19 09/28/19  Nicole Kindred A, DO  thiamine 100 MG tablet Place 1 tablet (100 mg total) into feeding tube daily. 08/30/19   Ezekiel Slocumb, DO  Water For Irrigation, Sterile (FREE WATER) SOLN Place 100 mLs into feeding tube every 4 (four) hours. 08/29/19   Ezekiel Slocumb, DO    Allergies Sulfa antibiotics  Family History  Problem Relation Age of Onset   Cancer Mother    Hypertension Father    Cancer Paternal Grandmother     Social History Social History   Tobacco Use   Smoking status: Never Smoker   Smokeless tobacco: Never Used  Substance Use Topics   Alcohol use: Yes    Alcohol/week: 5.0 standard drinks    Types: 5 Glasses of wine per week    Frequency: Never    Comment: last yesterday   Drug use: No    Review of Systems  Constitutional: No fever/chills Eyes: No visual changes. ENT: No sore throat. Cardiovascular: Denies chest pain. Respiratory: Denies shortness of breath. Gastrointestinal: No abdominal pain.  No nausea, no vomiting.  No diarrhea.  No constipation. Genitourinary: Negative for dysuria. Musculoskeletal: Negative for back pain. Skin: Negative for rash. Neurological: Negative for headaches, focal weakness or numbness.  ____________________________________________   PHYSICAL EXAM:  VITAL SIGNS: ED Triage Vitals  Enc Vitals Group     BP 09/03/19 1102 120/89     Pulse Rate 09/03/19 1102 (!) 115     Resp 09/03/19  1102 16     Temp 09/03/19 1102 98 F (36.7 C)     Temp src --      SpO2 09/03/19 1102 96 %     Weight 09/03/19 1104 174 lb 15.7 oz (79.4 kg)     Height 09/03/19 1104 5\' 8"  (1.727 m)     Head Circumference --      Peak Flow --      Pain Score 09/03/19 1104 0     Pain Loc --      Pain Edu? --      Excl. in [place]? --     Constitutional: Alert and oriented.  Intoxicated appearing. Eyes: Conjunctivae are normal. Head: Atraumatic. Nose: No congestion/rhinnorhea. Mouth/Throat: Mucous membranes are moist. Neck: Normal ROM Cardiovascular: Normal rate, regular rhythm. Grossly normal heart sounds. Respiratory: Normal respiratory effort.  No retractions. Lungs CTAB.  Left chest wall  tenderness. Gastrointestinal: Soft and nontender. No distention.  PEG tube in place with no erythema, warmth, or tenderness. Genitourinary: deferred Musculoskeletal: No lower extremity tenderness nor edema. Neurologic:  Normal speech and language. No gross focal neurologic deficits are appreciated. Skin:  Skin is warm, dry and intact. No rash noted. Psychiatric: Mood and affect are normal. Speech and behavior are normal.  ____________________________________________   LABS (all labs ordered are listed, but only abnormal results are displayed)  Labs Reviewed  COMPREHENSIVE METABOLIC PANEL - Abnormal; Notable for the following components:      Result Value   CO2 20 (*)    Glucose, Bld 109 (*)    Creatinine, Ser 0.45 (*)    Calcium 8.8 (*)    Albumin 3.4 (*)    AST 43 (*)    Alkaline Phosphatase 129 (*)    Anion gap 19 (*)    All other components within normal limits  ETHANOL - Abnormal; Notable for the following components:   Alcohol, Ethyl (B) 324 (*)    All other components within normal limits  ACETAMINOPHEN LEVEL - Abnormal; Notable for the following components:   Acetaminophen (Tylenol), Serum <10 (*)    All other components within normal limits  CBC - Abnormal; Notable for the following  components:   WBC 12.4 (*)    Hemoglobin 12.5 (*)    MCV 79.4 (*)    MCH 25.3 (*)    RDW 21.9 (*)    Platelets 488 (*)    All other components within normal limits  SALICYLATE LEVEL  URINE DRUG SCREEN, QUALITATIVE ([facility] ONLY)    PROCEDURES  Procedure(s) performed (including Critical Care):  Procedures   ____________________________________________   INITIAL IMPRESSION / ASSESSMENT AND PLAN / ED COURSE       63 year old male with history of alcohol abuse and recent PEG tube placement for esophageal stricture presents to the ED after being kicked out of his hotel room where he was residing.  His PEG tube appears to be functioning well and he denies any medical complaints at this time.  He also denies any SI or HI, no indication for psychiatric consultation.  He admits to ongoing alcohol consumption and labs show he is intoxicated here today.  Will check chest x-ray as well as CT head and C-spine given reported fall.  Remainder of labs are unremarkable, H&H is improved and no evidence of acute bleeding.  Social work will likely need to be involved.  Imaging is negative for sequela of trauma.  Patient was evaluated by social work and he reportedly has numerous resources as arranged following recent discharge.  He has steady income in the ability to arrange for a stay at another hotel if desired.  Patient now clinically sober and requesting discharge, counseled to return to the ED for new worsening symptoms.  Patient agrees with plan.        ____________________________________________   FINAL CLINICAL IMPRESSION(S) / ED DIAGNOSES  Final diagnoses:  Alcohol abuse  Homelessness  S/P percutaneous endoscopic gastrostomy (PEG) tube placement [facility])     ED Discharge Orders    None       Note:  This document was prepared using Dragon voice recognition software and may include unintentional dictation errors.   Blake Divine, MD 09/03/19 1555

## 2019-09-03 NOTE — ED Notes (Signed)
Extra blanket provided

## 2019-09-03 NOTE — ED Notes (Signed)
BEHAVIORAL HEALTH ROUNDING Patient sleeping: No. Patient alert and oriented: yes Behavior appropriate: Yes.  ; If no, describe:  Nutrition and fluids offered: yes Toileting and hygiene offered: Yes  Sitter present: q15 minute observations  Law enforcement present: Yes [employer]  

## 2019-09-03 NOTE — ED Notes (Signed)
US at bedside

## 2019-09-03 NOTE — ED Notes (Signed)
BEHAVIORAL HEALTH ROUNDING Patient sleeping: No. Patient alert and oriented: yes Behavior appropriate: Yes.  ; If no, describe:  Nutrition and fluids offered: yes Toileting and hygiene offered: Yes  Sitter present: q15 minute observations  Law enforcement present: Yes  [employer]   ENVIRONMENTAL ASSESSMENT Potentially harmful objects out of patient reach: Yes.   Personal belongings secured: Yes.   Patient dressed in hospital provided attire only: Yes.   Plastic bags out of patient reach: Yes.   Patient care equipment (cords, cables, call bells, lines, and drains) shortened, removed, or accounted for: Yes.   Equipment and supplies removed from bottom of stretcher: Yes.   Potentially toxic materials out of patient reach: Yes.   Sharps container removed or out of patient reach: Yes.   

## 2019-09-03 NOTE — ED Notes (Signed)
Patient repeatedly asking for pain medication, MD aware.

## 2019-09-03 NOTE — ED Notes (Signed)
Patient transported to CT 

## 2019-09-03 NOTE — ED Triage Notes (Signed)
Patient presents from [facility] with sudden onset R flank and chest pain. Patient just got out of the hospital yesterday. Patient reported drinking alcohol, approx 3 glasses of wine this evening. Patient has feeding tube

## 2019-09-03 NOTE — ED Notes (Signed)
Date and time results received: 09/03/19 2208 (use smartphrase ".now" to insert current time)  Test: lactic Critical Value: 10.5  Name of Provider Notified: Ellender Hose, MD  Orders Received? Or Actions Taken?: Orders Received - See Orders for details

## 2019-09-03 NOTE — ED Notes (Signed)
He verbalizes to me that he fell yesterday - "I think I broke a rib"

## 2019-09-03 NOTE — ED Triage Notes (Signed)
Patient was at hotel for one week (sent from our hospital after discharge.)  The hotel called police because pt calls them all the time.  Say he broke the tv and is demanding another one.  Say there was feces all over the room (which patient says is feeding tube soliution.)  And say patient called them to room and he was unclothed.  The hotel will not allow him to return ([facility] [facility] [place])  He does not know why he is here.

## 2019-09-03 NOTE — ED Notes (Signed)
[facility] here to transfer patient

## 2019-09-03 NOTE — ED Notes (Addendum)
Patient accepts risk of transfer to [facility] for CT surgery and gives his verbal consent

## 2019-09-03 NOTE — Social Work (Addendum)
CSW aware of consult, and looking into what resources can be provided at this time.   2:20pm - CSW asked patient if there were any resources he needed and he shared no, and that he just wanted to leave. CSW was provided with a plethora of resources from his most recent hospital stay. Patient has resources for other extended stay hotels, has transportation, and has income. Patient reported that he feels safe to leave.  CSW will share this information with EDP.   CSW signing off.    Fredric Mare  [facility] ED  [phone]

## 2019-09-03 NOTE — ED Provider Notes (Signed)
[facility] Emergency Department Provider Note  ____________________________________________   First MD Initiated Contact with Patient 09/03/19 2031     (approximate)  I have reviewed the triage vital signs and the nursing notes.   HISTORY  Chief Complaint Chest Pain and Abdominal Pain     HPI Afnan Emberton. is a 63 y.o. male with history of alcoholism here with right-sided pain.  The patient was just here overnight with alcohol intoxication and reported chest pain.  Chest x-ray was clear.  He states that since then, he has continued drinking and has continued vomiting.  He reports severe right abdominal pain, right chest pain, and occasional shortness of breath.  He denies any fevers.  He states he feels generally unwell and feels much worse than he did earlier today.  Denies any alleviating or aggravating factors.  He does admit to severe alcohol abuse recently.  Denies any blood in his emesis.        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[facility])     Patient Active Problem List   Diagnosis Date Noted   Anal condylomata    Rectal bleeding 08/20/2019   Rectal mass 08/20/2019   PEG (percutaneous endoscopic gastrostomy) status ([place])    Aspiration into airway    Fall 08/15/2019   Elevated LFTs    Esophageal stricture    Problems with swallowing and mastication    Stricture and stenosis of esophagus    Malnutrition of moderate degree 07/31/2019   Nausea & vomiting    Pulmonary emboli ([place]) 07/28/2019   Alcohol-induced mood disorder ([place]) 07/28/2019   Alcohol dependence with uncomplicated intoxication ([place])    Acute  respiratory failure with hypoxia ([place]) 05/15/2019   Wernicke's encephalopathy 04/15/2019   GI bleed 03/31/2019   Alcohol withdrawal ([place]) 12/09/2017    Past Surgical History:  Procedure Laterality Date   ESOPHAGOGASTRODUODENOSCOPY N/A 05/18/2019   Procedure: ESOPHAGOGASTRODUODENOSCOPY (EGD);  Surgeon: Lin Landsman, MD;  Location: [facility] ENDOSCOPY;  Service: Gastroenterology;  Laterality: N/A;   ESOPHAGOGASTRODUODENOSCOPY (EGD) WITH PROPOFOL N/A 08/02/2019   Procedure: ESOPHAGOGASTRODUODENOSCOPY (EGD) WITH PROPOFOL;  Surgeon: [name], MD;  Location: [facility] ENDOSCOPY;  Service: Endoscopy;  Laterality: N/A;   HERNIA REPAIR  1958   IR GASTROSTOMY TUBE MOD SED  08/06/2019   IR GASTROSTOMY TUBE MOD SED  08/17/2019   JOINT REPLACEMENT     REPLACEMENT TOTAL KNEE BILATERAL  2009   TONSILLECTOMY  2008   with neck dissection     Prior to Admission medications   Medication Sig Start Date End Date Taking? Authorizing Provider  acetaminophen (TYLENOL) 325 MG tablet Take 2 tablets (650 mg total) by mouth every 6 (six) hours as needed for mild pain (or Fever >/= 101). 08/29/19  Yes Nicole Kindred A, DO  apixaban (ELIQUIS) 5 MG TABS tablet Place 1 tablet (5 mg total) into feeding tube 2 (two) times daily. 08/29/19  Yes Ezekiel Slocumb, DO  folic acid (FOLVITE) 1 MG tablet Place 1 tablet (1 mg total) into feeding tube daily. 08/30/19  Yes Nicole Kindred A, DO  HYDROcodone-acetaminophen (NORCO/VICODIN) 5-325 MG tablet Take 1-2  tablets by mouth every 4 (four) hours as needed for up to 5 days for moderate pain. 08/29/19 09/03/19 Yes Nicole Kindred A, DO  hydrocortisone cream 1 % Apply topically every 6 (six) hours as needed for itching. 08/29/19  Yes Nicole Kindred A, DO  Nutritional Supplements (FEEDING SUPPLEMENT, OSMOLITE 1.5 CAL,) LIQD Place 474 mLs into feeding tube 3 (three) times daily. 08/29/19 09/28/19 Yes Nicole Kindred A, DO  omeprazole (PRILOSEC) 20 MG capsule Take 1  capsule (20 mg total) by mouth 2 (two) times daily before a meal. Open capsule and empty granules into small amount of liquid.  Give by PEG tube twice daily. 08/29/19 09/28/19 Yes Nicole Kindred A, DO  thiamine 100 MG tablet Place 1 tablet (100 mg total) into feeding tube daily. 08/30/19  Yes Ezekiel Slocumb, DO  Water For Irrigation, Sterile (FREE WATER) SOLN Place 100 mLs into feeding tube every 4 (four) hours. 08/29/19  Yes Ezekiel Slocumb, DO    Allergies Sulfa antibiotics  Family History  Problem Relation Age of Onset   Cancer Mother    Hypertension Father    Cancer Paternal Grandmother     Social History Social History   Tobacco Use   Smoking status: Never Smoker   Smokeless tobacco: Never Used  Substance Use Topics   Alcohol use: Yes    Alcohol/week: 14.0 standard drinks    Types: 14 Glasses of wine per week    Frequency: Never    Comment: last yesterday   Drug use: No    Review of Systems  Review of Systems  Constitutional: Positive for fatigue. Negative for chills and fever.  HENT: Negative for sore throat.   Respiratory: Positive for chest tightness. Negative for shortness of breath.   Cardiovascular: Positive for chest pain.  Gastrointestinal: Positive for abdominal pain.  Genitourinary: Negative for flank pain.  Musculoskeletal: Negative for neck pain.  Skin: Negative for rash and wound.  Allergic/Immunologic: Negative for immunocompromised state.  Neurological: Positive for weakness. Negative for numbness.  Hematological: Does not bruise/bleed easily.     ____________________________________________  PHYSICAL EXAM:      VITAL SIGNS: ED Triage Vitals  Enc Vitals Group     BP 09/03/19 2018 118/90     Pulse Rate 09/03/19 2018 (!) 113     Resp 09/03/19 2018 (!) 26     Temp 09/03/19 2013 98.1 F (36.7 C)     Temp src --      SpO2 09/03/19 2018 95 %     Weight 09/03/19 2014 170 lb (77.1 kg)     Height 09/03/19 2014 5\' 8"  (1.727 m)      Head Circumference --      Peak Flow --      Pain Score 09/03/19 2014 9     Pain Loc --      Pain Edu? --      Excl. in [place]? --      Physical Exam Vitals signs and nursing note reviewed.  Constitutional:      General: He is not in acute distress.    Appearance: He is well-developed.  HENT:     Head: Normocephalic and atraumatic.     Comments: Dry mucous membranes Eyes:     Conjunctiva/sclera: Conjunctivae normal.  Neck:     Musculoskeletal: Neck supple.  Cardiovascular:     Rate and Rhythm: Regular rhythm. Tachycardia present.     Heart sounds: Normal heart sounds. No murmur. No friction rub.  Pulmonary:  Effort: Pulmonary effort is normal. Tachypnea present. No respiratory distress.     Breath sounds: Examination of the right-lower field reveals rales. Decreased breath sounds and rales present. No wheezing.  Abdominal:     General: There is no distension.     Palpations: Abdomen is soft.     Tenderness: There is abdominal tenderness in the right upper quadrant, right lower quadrant and periumbilical area.  Skin:    General: Skin is warm.     Capillary Refill: Capillary refill takes less than 2 seconds.  Neurological:     Mental Status: He is alert and oriented to person, place, and time.     Motor: No abnormal muscle tone.       ____________________________________________   LABS (all labs ordered are listed, but only abnormal results are displayed)  Labs Reviewed  CBC WITH DIFFERENTIAL/PLATELET - Abnormal; Notable for the following components:      Result Value   WBC 31.5 (*)    RDW 21.9 (*)    Platelets 569 (*)    Neutro Abs 27.8 (*)    Monocytes Absolute 2.2 (*)    Abs Immature Granulocytes 0.31 (*)    All other components within normal limits  COMPREHENSIVE METABOLIC PANEL - Abnormal; Notable for the following components:   Chloride 93 (*)    CO2 12 (*)    Glucose, Bld 183 (*)    AST 62 (*)    Alkaline Phosphatase 139 (*)    Anion gap 31 (*)     All other components within normal limits  LACTIC ACID, PLASMA - Abnormal; Notable for the following components:   Lactic Acid, Venous 10.5 (*)    All other components within normal limits  CULTURE, BLOOD (ROUTINE X 2)  CULTURE, BLOOD (ROUTINE X 2)  SARS CORONAVIRUS 2 BY RT PCR (HOSPITAL ORDER, [facility])  LIPASE, BLOOD  LACTIC ACID, PLASMA  POC SARS CORONAVIRUS 2 AG -  ED    ____________________________________________  EKG:  ________________________________________  RADIOLOGY All imaging, including plain films, CT scans, and ultrasounds, independently reviewed by me, and interpretations confirmed via formal radiology reads.  ED MD interpretation:   Chest x-ray reviewed from previous visit, negative CT chest: Concern for esophageal perforation with right-sided pneumo and possible hydrothorax CT abdomen/pelvis: As above, no intra-abdominal emergency  Official radiology report(s): Dg Chest 2 View  Result Date: 09/03/2019 CLINICAL DATA:  Fall. EXAM: CHEST - 2 VIEW COMPARISON:  Chest radiograph 08/04/2019 FINDINGS: Heart size within normal limits. No airspace consolidation. No evidence of pneumothorax or sizable pleural effusion. No acute displaced fracture is identified. Subacute/chronic fracture of the distal left clavicle. IMPRESSION: No airspace consolidation. Subacute/chronic fracture of the distal left clavicle. Electronically Signed   By: Kellie Simmering DO   On: 09/03/2019 13:32   Ct Head Wo Contrast  Result Date: 09/03/2019 CLINICAL DATA:  Head injury after fall yesterday. EXAM: CT HEAD WITHOUT CONTRAST CT CERVICAL SPINE WITHOUT CONTRAST TECHNIQUE: Multidetector CT imaging of the head and cervical spine was performed following the standard protocol without intravenous contrast. Multiplanar CT image reconstructions of the cervical spine were also generated. COMPARISON:  August 14, 2019. FINDINGS: CT HEAD FINDINGS Brain: Mild diffuse cortical atrophy is  noted. Mild chronic ischemic white matter disease is noted. No mass effect or midline shift is noted. Ventricular size is within normal limits. There is no evidence of mass lesion, hemorrhage or acute infarction. Vascular: No hyperdense vessel or unexpected calcification. Skull: Normal. Negative for  fracture or focal lesion. Sinuses/Orbits: No acute finding. Other: None. CT CERVICAL SPINE FINDINGS Alignment: Normal. Skull base and vertebrae: No acute fracture. No primary bone lesion or focal pathologic process. Soft tissues and spinal canal: No prevertebral fluid or swelling. No visible canal hematoma. Disc levels: Moderate degenerative disc disease is noted at C5-6 with anterior posterior osteophyte formation. Upper chest: Negative. Other: None. IMPRESSION: 1. Mild diffuse cortical atrophy. Mild chronic ischemic white matter disease. No acute intracranial abnormality seen. 2. Moderate degenerative disc disease is noted at C5-6. No acute abnormality seen in the cervical spine. Electronically Signed   By: Marijo Conception M.D.   On: 09/03/2019 13:30   Ct Chest Wo Contrast  Result Date: 09/03/2019 CLINICAL DATA:  Pneumothorax EXAM: CT CHEST WITHOUT CONTRAST TECHNIQUE: Multidetector CT imaging of the chest was performed following the standard protocol without IV contrast. COMPARISON:  CT 09/03/2019, chest x-ray 09/03/2019, CT chest 07/28/2019, 05/15/2019 FINDINGS: Cardiovascular: Some contrast present within the intravascular space from prior CT abdomen pelvis mild aortic atherosclerosis. No aneurysm. Coronary vascular calcification. Normal heart size. No pericardial effusion Mediastinum/Nodes: Midline trachea. No thyroid mass. Subcentimeter mediastinal lymph nodes. Diffuse fluid within the esophagus. Indistinct distal esophagus and GE junction with edema and tiny fluid. Small foci of gas around the distal esophagus with moderate retrocrural air. Lungs/Pleura: Moderate right hydropneumothorax. Mild bronchiectasis  in the right upper lobe with patchy ground-glass density. Mild ground-glass density and nodularity in the right lower lobe. Similar findings in the left lower lobe but to a lesser degree. 3 mm pulmonary nodule in the peripheral left lung base. Upper Abdomen: No additional findings Musculoskeletal: No chest wall mass or suspicious bone lesions identified. IMPRESSION: 1. Moderate right hydropneumothorax. Indistinct appearance of the distal esophagus with thickening and multiple small foci of gas adjacent to the thickened distal esophagus and moderate retrocrural gas. Collective findings are suspicious for distal esophageal perforation. 2. Redemonstrated ground-glass density and nodularity in the right upper, right lower and left lower lobes with scattered pulmonary nodules, possibly due to chronic atypical infection, similar findings on 05/15/2019 CT. Critical Value/emergent results were called by telephone at the time of interpretation on 09/03/2019 at 10:55 pm to providerCAMERON Delando Satter , who verbally acknowledged these results. Aortic Atherosclerosis (ICD10-I70.0). Electronically Signed   By: Donavan Foil M.D.   On: 09/03/2019 22:55   Ct Cervical Spine Wo Contrast  Result Date: 09/03/2019 CLINICAL DATA:  Head injury after fall yesterday. EXAM: CT HEAD WITHOUT CONTRAST CT CERVICAL SPINE WITHOUT CONTRAST TECHNIQUE: Multidetector CT imaging of the head and cervical spine was performed following the standard protocol without intravenous contrast. Multiplanar CT image reconstructions of the cervical spine were also generated. COMPARISON:  August 14, 2019. FINDINGS: CT HEAD FINDINGS Brain: Mild diffuse cortical atrophy is noted. Mild chronic ischemic white matter disease is noted. No mass effect or midline shift is noted. Ventricular size is within normal limits. There is no evidence of mass lesion, hemorrhage or acute infarction. Vascular: No hyperdense vessel or unexpected calcification. Skull: Normal. Negative  for fracture or focal lesion. Sinuses/Orbits: No acute finding. Other: None. CT CERVICAL SPINE FINDINGS Alignment: Normal. Skull base and vertebrae: No acute fracture. No primary bone lesion or focal pathologic process. Soft tissues and spinal canal: No prevertebral fluid or swelling. No visible canal hematoma. Disc levels: Moderate degenerative disc disease is noted at C5-6 with anterior posterior osteophyte formation. Upper chest: Negative. Other: None. IMPRESSION: 1. Mild diffuse cortical atrophy. Mild chronic ischemic white matter disease. No acute intracranial  abnormality seen. 2. Moderate degenerative disc disease is noted at C5-6. No acute abnormality seen in the cervical spine. Electronically Signed   By: Marijo Conception M.D.   On: 09/03/2019 13:30   Ct Abdomen Pelvis W Contrast  Result Date: 09/03/2019 CLINICAL DATA:  Sudden onset of right-sided chest and abdominal pain EXAM: CT ABDOMEN AND PELVIS WITH CONTRAST TECHNIQUE: Multidetector CT imaging of the abdomen and pelvis was performed using the standard protocol following bolus administration of intravenous contrast. CONTRAST:  125mL OMNIPAQUE IOHEXOL 300 MG/ML  SOLN COMPARISON:  07/28/2019 CT, chest x-ray from earlier in the same day. FINDINGS: Lower chest: Lung bases demonstrate evidence of a small anterior right pneumothorax as well as large right-sided pleural effusion. These changes were not seen on a chest x-ray from earlier in the same day. Hepatobiliary: Diffuse decreased attenuation is noted consistent with fatty infiltration. The gallbladder is within normal limits. Pancreas: Unremarkable. No pancreatic ductal dilatation or surrounding inflammatory changes. Spleen: Normal in size without focal abnormality. Adrenals/Urinary Tract: Adrenal glands are unremarkable. Kidneys demonstrate a normal enhancement pattern bilaterally. No renal calculi or obstructive changes are seen. The bladder is partially distended. Stomach/Bowel: Prominent  hemorrhoids are noted at the level of the anus. Scattered diverticular change of the colon is seen without evidence of diverticulitis. Gastrostomy catheter is seen in place. Changes of prior appendectomy are noted. No small bowel abnormality is seen. Vascular/Lymphatic: Aortic atherosclerosis. No enlarged abdominal or pelvic lymph nodes. Reproductive: Prostate is unremarkable. Other: No abdominal wall hernia or abnormality. No abdominopelvic ascites. Musculoskeletal: No acute or significant osseous findings. IMPRESSION: New right-sided pleural effusion and right anterior pneumothorax which were not present on a recent chest x-ray from approximately 9 hours ago. Although not mentioned in the body of the report there is some retrocrural air extending from adjacent to the distal esophagus. It is possible these changes as well as the pneumothorax could be related to esophageal injury. Correlation with any recent vomiting is recommended. Fatty infiltration of the liver. Chronic changes as described above. Electronically Signed   By: Inez Catalina M.D.   On: 09/03/2019 22:37   US Abdomen Limited Ruq  Result Date: 09/03/2019 CLINICAL DATA:  Right upper quadrant pain EXAM: ULTRASOUND ABDOMEN LIMITED RIGHT UPPER QUADRANT COMPARISON:  08/18/2019 radiograph, ultrasound 07/28/2019 FINDINGS: Gallbladder: No gallstones or wall thickening visualized. No sonographic Murphy sign noted by sonographer. Common bile duct: Diameter: 3 mm Liver: Increased hepatic echogenicity without focal hepatic abnormality. Portal vein is patent on color Doppler imaging with normal direction of blood flow towards the liver. Other: Possible small right pleural effusion IMPRESSION: 1. Negative for gallstones or biliary dilatation 2. Echogenic liver consistent with steatosis and or hepatocellular disease 3. Possible right pleural effusion Electronically Signed   By: Donavan Foil M.D.   On: 09/03/2019 21:40     ____________________________________________  PROCEDURES   Procedure(s) performed (including Critical Care):  Procedures  ____________________________________________  INITIAL IMPRESSION / MDM / [place] / ED COURSE  As part of my medical decision making, I reviewed the following data within the [facility] notes reviewed and incorporated, Old chart reviewed, Notes from prior ED visits, and [place] Controlled Substance Database       *Marquavis A Cormac Wint. was evaluated in Emergency Department on 09/03/2019 for the symptoms described in the history of present illness. He was evaluated in the context of the global COVID-19 pandemic, which necessitated consideration that the patient might be at risk for infection with  the SARS-CoV-2 virus that causes COVID-19. Institutional protocols and algorithms that pertain to the evaluation of patients at risk for COVID-19 are in a state of rapid change based on information released by regulatory bodies including the CDC and federal and state organizations. These policies and algorithms were followed during the patient's care in the ED.  Some ED evaluations and interventions may be delayed as a result of limited staffing during the pandemic.*     Medical Decision Making: 63 year old male here with initial report of right upper quadrant pain.  Ultrasound negative.  Patient tachycardic and in obvious distress, so started on empiric fluids and antibiotics.  Patient taken for CT scan, which shows likely esophageal perforation with pneumo thorax.  Concern for Boerhaave's syndrome.  Lab work shows marked leukocytosis, lactic acidosis, and acidemia, all likely combination of profound dehydration as well as alcoholic ketoacidosis.  Patient given 3 L fluid, vancomycin and Zosyn, and emergent consult placed to Dr. [name] with CT surgery.  He will go immediately to the OR at [facility].  Rapid Covid negative.  Patient updated and  in agreement.  He is satting 94% on room air, placed on supplemental oxygen for transport.  ____________________________________________  FINAL CLINICAL IMPRESSION(S) / ED DIAGNOSES  Final diagnoses:  RUQ pain  Boerhaave syndrome  Esophageal perforation     MEDICATIONS GIVEN DURING THIS VISIT:  Medications  piperacillin-tazobactam (ZOSYN) IVPB 3.375 g (3.375 g Intravenous New Bag/Given 09/03/19 2300)  sodium chloride 0.9 % bolus 1,000 mL (has no administration in time range)  sodium chloride 0.9 % bolus 500 mL (has no administration in time range)  vancomycin (VANCOCIN) 1,750 mg in sodium chloride 0.9 % 500 mL IVPB (has no administration in time range)  morphine 4 MG/ML injection (has no administration in time range)  sodium chloride 0.9 % bolus 1,000 mL (1,000 mLs Intravenous New Bag/Given 09/03/19 2116)  ondansetron (ZOFRAN) injection 4 mg (4 mg Intravenous Given 09/03/19 2115)  fentaNYL (SUBLIMAZE) injection 50 mcg (50 mcg Intravenous Given 09/03/19 2115)  morphine 4 MG/ML injection 4 mg (4 mg Intravenous Given 09/03/19 2201)  sodium chloride 0.9 % bolus 1,000 mL (1,000 mLs Intravenous New Bag/Given 09/03/19 2259)  iohexol (OMNIPAQUE) 300 MG/ML solution 100 mL (100 mLs Intravenous Contrast Given 09/03/19 2209)  pantoprazole (PROTONIX) injection 40 mg (40 mg Intravenous Given 09/03/19 2257)     ED Discharge Orders    None       Note:  This document was prepared using Dragon voice recognition software and may include unintentional dictation errors.   Duffy Bruce, MD 09/03/19 [id number]

## 2019-09-04 ENCOUNTER — Encounter (HOSPITAL_COMMUNITY): Payer: Self-pay | Admitting: Thoracic Surgery (Cardiothoracic Vascular Surgery)

## 2019-09-04 ENCOUNTER — Inpatient Hospital Stay (HOSPITAL_COMMUNITY): Payer: Self-pay | Admitting: Certified Registered"

## 2019-09-04 ENCOUNTER — Inpatient Hospital Stay (HOSPITAL_COMMUNITY): Payer: Self-pay

## 2019-09-04 DIAGNOSIS — K223 Perforation of esophagus: Secondary | ICD-10-CM | POA: Diagnosis present

## 2019-09-04 LAB — BASIC METABOLIC PANEL
Anion gap: 14 (ref 5–15)
BUN: 9 mg/dL (ref 8–23)
CO2: 20 mmol/L — ABNORMAL LOW (ref 22–32)
Calcium: 7.6 mg/dL — ABNORMAL LOW (ref 8.9–10.3)
Chloride: 105 mmol/L (ref 98–111)
Creatinine, Ser: 0.51 mg/dL — ABNORMAL LOW (ref 0.61–1.24)
GFR calc Af Amer: >60 mL/min (ref >60)
GFR calc non Af Amer: >60 mL/min (ref >60)
Glucose, Bld: 134 mg/dL — ABNORMAL HIGH (ref 70–99)
Potassium: 4.2 mmol/L (ref 3.5–5.1)
Sodium: 139 mmol/L (ref 135–145)

## 2019-09-04 LAB — POCT I-STAT 7, (LYTES, BLD GAS, ICA,H+H)
Acid-Base Excess: 2 mmol/L (ref 0.0–2.0)
Acid-base deficit: 13 mmol/L — ABNORMAL HIGH (ref 0.0–2.0)
Acid-base deficit: 8 mmol/L — ABNORMAL HIGH (ref 0.0–2.0)
Acid-base deficit: 6 mmol/L — ABNORMAL HIGH (ref 0.0–2.0)
Bicarbonate: 15.8 mmol/L — ABNORMAL LOW (ref 20.0–28.0)
Bicarbonate: 19.9 mmol/L — ABNORMAL LOW (ref 20.0–28.0)
Bicarbonate: 20.9 mmol/L (ref 20.0–28.0)
Bicarbonate: 26.5 mmol/L (ref 20.0–28.0)
Calcium, Ion: 1.16 mmol/L (ref 1.15–1.40)
Calcium, Ion: 1.12 mmol/L — ABNORMAL LOW (ref 1.15–1.40)
Calcium, Ion: 1.17 mmol/L (ref 1.15–1.40)
Calcium, Ion: 1.19 mmol/L (ref 1.15–1.40)
HCT: 35 % — ABNORMAL LOW (ref 39.0–52.0)
HCT: 29 % — ABNORMAL LOW (ref 39.0–52.0)
HCT: 30 % — ABNORMAL LOW (ref 39.0–52.0)
HCT: 30 % — ABNORMAL LOW (ref 39.0–52.0)
Hemoglobin: 11.9 g/dL — ABNORMAL LOW (ref 13.0–17.0)
Hemoglobin: 9.9 g/dL — ABNORMAL LOW (ref 13.0–17.0)
Hemoglobin: 10.2 g/dL — ABNORMAL LOW (ref 13.0–17.0)
Hemoglobin: 10.2 g/dL — ABNORMAL LOW (ref 13.0–17.0)
O2 Saturation: 97 %
O2 Saturation: 92 %
O2 Saturation: 99 %
O2 Saturation: 99 %
Patient temperature: 36
Patient temperature: 35.2
Patient temperature: 97.6
Patient temperature: 97.6
Potassium: 4.3 mmol/L (ref 3.5–5.1)
Potassium: 3.9 mmol/L (ref 3.5–5.1)
Potassium: 4.2 mmol/L (ref 3.5–5.1)
Potassium: 4 mmol/L (ref 3.5–5.1)
Sodium: 136 mmol/L (ref 135–145)
Sodium: 140 mmol/L (ref 135–145)
Sodium: 139 mmol/L (ref 135–145)
Sodium: 137 mmol/L (ref 135–145)
TCO2: 17 mmol/L — ABNORMAL LOW (ref 22–32)
TCO2: 21 mmol/L — ABNORMAL LOW (ref 22–32)
TCO2: 22 mmol/L (ref 22–32)
TCO2: 28 mmol/L (ref 22–32)
pCO2 arterial: 43.9 mmHg (ref 32.0–48.0)
pCO2 arterial: 45.4 mmHg (ref 32.0–48.0)
pCO2 arterial: 44.4 mmHg (ref 32.0–48.0)
pCO2 arterial: 40 mmHg (ref 32.0–48.0)
pH, Arterial: 7.159 — CL (ref 7.350–7.450)
pH, Arterial: 7.241 — ABNORMAL LOW (ref 7.350–7.450)
pH, Arterial: 7.279 — ABNORMAL LOW (ref 7.350–7.450)
pH, Arterial: 7.426 (ref 7.350–7.450)
pO2, Arterial: 108 mmHg (ref 83.0–108.0)
pO2, Arterial: 68 mmHg — ABNORMAL LOW (ref 83.0–108.0)
pO2, Arterial: 174 mmHg — ABNORMAL HIGH (ref 83.0–108.0)
pO2, Arterial: 111 mmHg — ABNORMAL HIGH (ref 83.0–108.0)

## 2019-09-04 LAB — GLUCOSE, CAPILLARY
Glucose-Capillary: 132 mg/dL — ABNORMAL HIGH (ref 70–99)
Glucose-Capillary: 128 mg/dL — ABNORMAL HIGH (ref 70–99)
Glucose-Capillary: 173 mg/dL — ABNORMAL HIGH (ref 70–99)
Glucose-Capillary: 120 mg/dL — ABNORMAL HIGH (ref 70–99)
Glucose-Capillary: 106 mg/dL — ABNORMAL HIGH (ref 70–99)
Glucose-Capillary: 123 mg/dL — ABNORMAL HIGH (ref 70–99)

## 2019-09-04 LAB — SARS CORONAVIRUS 2 BY RT PCR (HOSPITAL ORDER, PERFORMED IN ~~LOC~~ HOSPITAL LAB): SARS Coronavirus 2: NEGATIVE

## 2019-09-04 LAB — CBC
HCT: 31.1 % — ABNORMAL LOW (ref 39.0–52.0)
Hemoglobin: 9.3 g/dL — ABNORMAL LOW (ref 13.0–17.0)
MCH: 26.3 pg (ref 26.0–34.0)
MCHC: 29.9 g/dL — ABNORMAL LOW (ref 30.0–36.0)
MCV: 87.9 fL (ref 80.0–100.0)
Platelets: 257 10*3/uL (ref 150–400)
RBC: 3.54 MIL/uL — ABNORMAL LOW (ref 4.22–5.81)
RDW: 21.2 % — ABNORMAL HIGH (ref 11.5–15.5)
WBC: 11.6 10*3/uL — ABNORMAL HIGH (ref 4.0–10.5)
nRBC: 0 % (ref 0.0–0.2)

## 2019-09-04 LAB — ABO/RH: ABO/RH(D): B POS

## 2019-09-04 MED ORDER — HYDROXYZINE HCL 25 MG PO TABS
25.0000 mg | ORAL_TABLET | Freq: Four times a day (QID) | ORAL | Status: DC | PRN
  Administered 2019-09-04: 21:00:00 25 mg
  Filled 2019-09-04: qty 1

## 2019-09-04 MED ORDER — DOCUSATE SODIUM 50 MG/5ML PO LIQD: 100.0000 mg | Freq: Two times a day (BID) | ORAL | Status: DC | PRN

## 2019-09-04 MED ORDER — ACETAMINOPHEN 160 MG/5ML PO SOLN
1000.0000 mg | Freq: Four times a day (QID) | ORAL | Status: AC
  Administered 2019-09-04 – 2019-09-08 (×14): 1000 mg
  Filled 2019-09-04 (×14): qty 40.6

## 2019-09-04 MED ORDER — EPHEDRINE 5 MG/ML INJ
INTRAVENOUS | Status: AC
  Filled 2019-09-04: qty 10

## 2019-09-04 MED ORDER — PHENYLEPHRINE 40 MCG/ML (10ML) SYRINGE FOR IV PUSH (FOR BLOOD PRESSURE SUPPORT)
PREFILLED_SYRINGE | INTRAVENOUS | Status: AC
  Filled 2019-09-04: qty 10

## 2019-09-04 MED ORDER — MORPHINE SULFATE (PF) 2 MG/ML IV SOLN
2.0000 mg | INTRAVENOUS | Status: DC | PRN
  Administered 2019-09-04 – 2019-09-19 (×58): 2 mg via INTRAVENOUS
  Filled 2019-09-04 (×58): qty 1

## 2019-09-04 MED ORDER — SODIUM CHLORIDE (PF) 0.9 % IJ SOLN
INTRAMUSCULAR | Status: AC
  Filled 2019-09-04: qty 10

## 2019-09-04 MED ORDER — CHLORHEXIDINE GLUCONATE 0.12 % MT SOLN
15.0000 mL | Freq: Two times a day (BID) | OROMUCOSAL | Status: DC
  Administered 2019-09-04 – 2019-11-03 (×94): 15 mL via OROMUCOSAL
  Filled 2019-09-04 (×88): qty 15

## 2019-09-04 MED ORDER — HYDROCORTISONE 1 % EX CREA: TOPICAL_CREAM | Freq: Four times a day (QID) | CUTANEOUS | Status: DC | PRN

## 2019-09-04 MED ORDER — 0.9 % SODIUM CHLORIDE (POUR BTL) OPTIME
TOPICAL | Status: DC | PRN
  Administered 2019-09-04: 02:00:00 2000 mL

## 2019-09-04 MED ORDER — BISACODYL 5 MG PO TBEC: 10.0000 mg | DELAYED_RELEASE_TABLET | Freq: Every day | ORAL | Status: DC

## 2019-09-04 MED ORDER — THIAMINE HCL 100 MG/ML IJ SOLN
100.0000 mg | Freq: Every day | INTRAMUSCULAR | Status: DC
  Administered 2019-09-04 – 2019-10-22 (×49): 100 mg via INTRAVENOUS
  Filled 2019-09-04 (×49): qty 2

## 2019-09-04 MED ORDER — BISACODYL 10 MG RE SUPP: 10.0000 mg | Freq: Every day | RECTAL | Status: DC | PRN

## 2019-09-04 MED ORDER — LACTATED RINGERS IV SOLN
INTRAVENOUS | Status: DC | PRN
  Administered 2019-09-04 (×2): via INTRAVENOUS

## 2019-09-04 MED ORDER — LORAZEPAM 1 MG PO TABS: 1.0000 mg | ORAL_TABLET | Freq: Four times a day (QID) | ORAL | Status: DC | PRN

## 2019-09-04 MED ORDER — SENNOSIDES-DOCUSATE SODIUM 8.6-50 MG PO TABS: 1.0000 | ORAL_TABLET | Freq: Every day | ORAL | Status: DC

## 2019-09-04 MED ORDER — FENTANYL CITRATE (PF) 250 MCG/5ML IJ SOLN
INTRAMUSCULAR | Status: AC
  Filled 2019-09-04: qty 5

## 2019-09-04 MED ORDER — PROPOFOL 10 MG/ML IV BOLUS
INTRAVENOUS | Status: DC | PRN
  Administered 2019-09-04: 30 mg via INTRAVENOUS
  Administered 2019-09-04: 120 mg via INTRAVENOUS

## 2019-09-04 MED ORDER — METOPROLOL TARTRATE 5 MG/5ML IV SOLN
5.0000 mg | Freq: Four times a day (QID) | INTRAVENOUS | Status: DC | PRN
  Administered 2019-09-04 – 2019-09-14 (×5): 5 mg via INTRAVENOUS
  Filled 2019-09-04 (×8): qty 5

## 2019-09-04 MED ORDER — MIDAZOLAM HCL 2 MG/2ML IJ SOLN: 2.0000 mg | INTRAMUSCULAR | Status: DC | PRN

## 2019-09-04 MED ORDER — LORAZEPAM 2 MG/ML IJ SOLN
1.0000 mg | Freq: Four times a day (QID) | INTRAMUSCULAR | Status: DC
  Administered 2019-09-04 – 2019-09-10 (×23): 1 mg via INTRAVENOUS
  Filled 2019-09-04 (×23): qty 1

## 2019-09-04 MED ORDER — LIDOCAINE 2% (20 MG/ML) 5 ML SYRINGE
INTRAMUSCULAR | Status: AC
  Filled 2019-09-04: qty 5

## 2019-09-04 MED ORDER — ACETAMINOPHEN 500 MG PO TABS: 1000.0000 mg | ORAL_TABLET | Freq: Four times a day (QID) | ORAL | Status: DC

## 2019-09-04 MED ORDER — HYDROXYZINE HCL 25 MG PO TABS: 25.0000 mg | ORAL_TABLET | Freq: Four times a day (QID) | ORAL | Status: DC | PRN

## 2019-09-04 MED ORDER — HYDROMORPHONE HCL 1 MG/ML IJ SOLN
1.0000 mg | INTRAMUSCULAR | Status: AC | PRN
  Administered 2019-09-04 – 2019-09-06 (×3): 1 mg via INTRAVENOUS
  Filled 2019-09-04 (×4): qty 1

## 2019-09-04 MED ORDER — CHLORHEXIDINE GLUCONATE CLOTH 2 % EX PADS
6.0000 | MEDICATED_PAD | Freq: Every day | CUTANEOUS | Status: DC
  Administered 2019-09-04 – 2019-11-03 (×54): 6 via TOPICAL

## 2019-09-04 MED ORDER — ACETAMINOPHEN 160 MG/5ML PO SOLN
1000.0000 mg | Freq: Four times a day (QID) | ORAL | Status: DC
  Filled 2019-09-04: qty 40.6

## 2019-09-04 MED ORDER — PHENYLEPHRINE HCL (PRESSORS) 10 MG/ML IV SOLN
INTRAVENOUS | Status: DC | PRN
  Administered 2019-09-04: 160 ug via INTRAVENOUS

## 2019-09-04 MED ORDER — INSULIN ASPART 100 UNIT/ML ~~LOC~~ SOLN
0.0000 [IU] | SUBCUTANEOUS | Status: DC
  Administered 2019-09-04: 2 [IU] via SUBCUTANEOUS
  Administered 2019-09-04: 4 [IU] via SUBCUTANEOUS
  Administered 2019-09-04 – 2019-09-07 (×10): 2 [IU] via SUBCUTANEOUS
  Administered 2019-09-11: 4 [IU] via SUBCUTANEOUS
  Administered 2019-09-11 – 2019-09-17 (×17): 2 [IU] via SUBCUTANEOUS

## 2019-09-04 MED ORDER — INFLUENZA VAC SPLIT QUAD 0.5 ML IM SUSY
0.5000 mL | PREFILLED_SYRINGE | INTRAMUSCULAR | Status: AC
  Administered 2019-09-13: 0.5 mL via INTRAMUSCULAR
  Filled 2019-09-04: qty 0.5

## 2019-09-04 MED ORDER — BUPIVACAINE HCL (PF) 0.5 % IJ SOLN
INTRAMUSCULAR | Status: AC
  Filled 2019-09-04: qty 30

## 2019-09-04 MED ORDER — SODIUM BICARBONATE 8.4 % IV SOLN
INTRAVENOUS | Status: DC | PRN
  Administered 2019-09-04: 100 meq via INTRAVENOUS

## 2019-09-04 MED ORDER — ENOXAPARIN SODIUM 40 MG/0.4ML ~~LOC~~ SOLN
40.0000 mg | Freq: Every day | SUBCUTANEOUS | Status: DC
  Administered 2019-09-04 – 2019-09-12 (×9): 40 mg via SUBCUTANEOUS
  Filled 2019-09-04 (×9): qty 0.4

## 2019-09-04 MED ORDER — FOLIC ACID 5 MG/ML IJ SOLN
1.0000 mg | Freq: Every day | INTRAMUSCULAR | Status: DC
  Administered 2019-09-04 – 2019-11-02 (×57): 1 mg via INTRAVENOUS
  Filled 2019-09-04 (×71): qty 0.2

## 2019-09-04 MED ORDER — ONDANSETRON HCL 4 MG/2ML IJ SOLN
4.0000 mg | Freq: Four times a day (QID) | INTRAMUSCULAR | Status: DC | PRN
  Administered 2019-09-07 – 2019-10-31 (×8): 4 mg via INTRAVENOUS
  Filled 2019-09-04 (×8): qty 2

## 2019-09-04 MED ORDER — VITAMIN B-1 100 MG PO TABS: 100.0000 mg | ORAL_TABLET | Freq: Every day | ORAL | Status: DC

## 2019-09-04 MED ORDER — THIAMINE HCL 100 MG/ML IJ SOLN
100.0000 mg | Freq: Once | INTRAMUSCULAR | Status: AC
  Administered 2019-09-04: 100 mg via INTRAMUSCULAR
  Filled 2019-09-04: qty 2

## 2019-09-04 MED ORDER — DEXTROSE-NACL 5-0.45 % IV SOLN
INTRAVENOUS | Status: DC
  Administered 2019-09-04 – 2019-09-12 (×14): via INTRAVENOUS

## 2019-09-04 MED ORDER — FENTANYL CITRATE (PF) 250 MCG/5ML IJ SOLN
INTRAMUSCULAR | Status: DC | PRN
  Administered 2019-09-04 (×2): 50 ug via INTRAVENOUS
  Administered 2019-09-04: 100 ug via INTRAVENOUS
  Administered 2019-09-04 (×4): 50 ug via INTRAVENOUS
  Administered 2019-09-04: 100 ug via INTRAVENOUS

## 2019-09-04 MED ORDER — HYDROMORPHONE HCL 1 MG/ML IJ SOLN
1.0000 mg | INTRAMUSCULAR | Status: DC | PRN
  Administered 2019-09-04 (×2): 1 mg via INTRAVENOUS
  Administered 2019-09-04: 2 mg via INTRAVENOUS
  Administered 2019-09-06 – 2019-09-07 (×9): 1 mg via INTRAVENOUS
  Filled 2019-09-04 (×6): qty 1
  Filled 2019-09-04 (×2): qty 2
  Filled 2019-09-04 (×4): qty 1

## 2019-09-04 MED ORDER — IPRATROPIUM-ALBUTEROL 0.5-2.5 (3) MG/3ML IN SOLN
3.0000 mL | Freq: Four times a day (QID) | RESPIRATORY_TRACT | Status: DC
  Administered 2019-09-04: 3 mL via RESPIRATORY_TRACT
  Filled 2019-09-04: qty 3

## 2019-09-04 MED ORDER — SUCCINYLCHOLINE CHLORIDE 200 MG/10ML IV SOSY
PREFILLED_SYRINGE | INTRAVENOUS | Status: AC
  Filled 2019-09-04: qty 20

## 2019-09-04 MED ORDER — PHENYLEPHRINE HCL-NACL 10-0.9 MG/250ML-% IV SOLN
INTRAVENOUS | Status: DC | PRN
  Administered 2019-09-04: 20 ug/min via INTRAVENOUS

## 2019-09-04 MED ORDER — SODIUM CHLORIDE 0.9 % IV SOLN
INTRAVENOUS | Status: DC
  Administered 2019-09-04: 05:00:00 via INTRAVENOUS

## 2019-09-04 MED ORDER — FOLIC ACID 1 MG PO TABS: 1.0000 mg | ORAL_TABLET | Freq: Every day | ORAL | Status: DC

## 2019-09-04 MED ORDER — ORAL CARE MOUTH RINSE
15.0000 mL | Freq: Two times a day (BID) | OROMUCOSAL | Status: DC
  Administered 2019-09-05 – 2019-11-03 (×90): 15 mL via OROMUCOSAL

## 2019-09-04 MED ORDER — SODIUM CHLORIDE 0.9 % IV SOLN: INTRAVENOUS | Status: DC | PRN

## 2019-09-04 MED ORDER — EPINEPHRINE 1 MG/10ML IJ SOSY
PREFILLED_SYRINGE | INTRAMUSCULAR | Status: AC
  Filled 2019-09-04: qty 10

## 2019-09-04 MED ORDER — SODIUM CHLORIDE 0.9% IV SOLUTION: Freq: Once | INTRAVENOUS | Status: DC

## 2019-09-04 MED ORDER — LACTATED RINGERS IV SOLN
INTRAVENOUS | Status: DC | PRN
  Administered 2019-09-04 (×2): via INTRAVENOUS

## 2019-09-04 MED ORDER — ADULT MULTIVITAMIN W/MINERALS CH: 1.0000 | ORAL_TABLET | Freq: Every day | ORAL | Status: DC

## 2019-09-04 MED ORDER — VANCOMYCIN HCL IN DEXTROSE 1-5 GM/200ML-% IV SOLN
1000.0000 mg | Freq: Two times a day (BID) | INTRAVENOUS | Status: DC
  Administered 2019-09-04 – 2019-09-08 (×8): 1000 mg via INTRAVENOUS
  Filled 2019-09-04 (×8): qty 200

## 2019-09-04 MED ORDER — PROTAMINE SULFATE 10 MG/ML IV SOLN
INTRAVENOUS | Status: AC
  Filled 2019-09-04: qty 125

## 2019-09-04 MED ORDER — ROCURONIUM BROMIDE 100 MG/10ML IV SOLN
INTRAVENOUS | Status: DC | PRN
  Administered 2019-09-04: 50 mg via INTRAVENOUS
  Administered 2019-09-04: 100 mg via INTRAVENOUS
  Administered 2019-09-04 (×2): 50 mg via INTRAVENOUS

## 2019-09-04 MED ORDER — VANCOMYCIN HCL 10 G IV SOLR
1750.0000 mg | INTRAVENOUS | Status: AC
  Administered 2019-09-04: 1750 mg via INTRAVENOUS
  Filled 2019-09-04: qty 1750

## 2019-09-04 MED ORDER — SUCCINYLCHOLINE CHLORIDE 20 MG/ML IJ SOLN
INTRAMUSCULAR | Status: DC | PRN
  Administered 2019-09-04: 100 mg via INTRAVENOUS

## 2019-09-04 MED ORDER — LIDOCAINE HCL (CARDIAC) PF 100 MG/5ML IV SOSY
PREFILLED_SYRINGE | INTRAVENOUS | Status: DC | PRN
  Administered 2019-09-04: 80 mg via INTRATRACHEAL

## 2019-09-04 MED ORDER — ARTIFICIAL TEARS OPHTHALMIC OINT
TOPICAL_OINTMENT | OPHTHALMIC | Status: AC
  Filled 2019-09-04: qty 3.5

## 2019-09-04 MED ORDER — MIDAZOLAM HCL 5 MG/5ML IJ SOLN
INTRAMUSCULAR | Status: DC | PRN
  Administered 2019-09-04: 2 mg via INTRAVENOUS

## 2019-09-04 MED ORDER — ENOXAPARIN SODIUM 40 MG/0.4ML ~~LOC~~ SOLN: 40.0000 mg | Freq: Every day | SUBCUTANEOUS | Status: DC

## 2019-09-04 MED ORDER — IPRATROPIUM-ALBUTEROL 0.5-2.5 (3) MG/3ML IN SOLN
3.0000 mL | Freq: Four times a day (QID) | RESPIRATORY_TRACT | Status: DC | PRN
  Administered 2019-09-14 – 2019-09-18 (×9): 3 mL via RESPIRATORY_TRACT
  Filled 2019-09-04 (×9): qty 3

## 2019-09-04 MED ORDER — DEXMEDETOMIDINE HCL IN NACL 400 MCG/100ML IV SOLN
0.0000 ug/kg/h | INTRAVENOUS | Status: DC
  Administered 2019-09-04 – 2019-09-05 (×2): 0.4 ug/kg/h via INTRAVENOUS
  Administered 2019-09-05: 0.2 ug/kg/h via INTRAVENOUS
  Administered 2019-09-07: 0.8 ug/kg/h via INTRAVENOUS
  Filled 2019-09-04 (×5): qty 100

## 2019-09-04 MED ORDER — ROCURONIUM BROMIDE 10 MG/ML (PF) SYRINGE
PREFILLED_SYRINGE | INTRAVENOUS | Status: AC
  Filled 2019-09-04: qty 30

## 2019-09-04 MED ORDER — BUPIVACAINE LIPOSOME 1.3 % IJ SUSP
20.0000 mL | Freq: Once | INTRAMUSCULAR | Status: DC
  Filled 2019-09-04: qty 20

## 2019-09-04 MED ORDER — ONDANSETRON 4 MG PO TBDP: 4.0000 mg | ORAL_TABLET | Freq: Four times a day (QID) | ORAL | Status: DC | PRN

## 2019-09-04 MED ORDER — PIPERACILLIN-TAZOBACTAM 3.375 G IVPB
3.3750 g | Freq: Three times a day (TID) | INTRAVENOUS | Status: AC
  Administered 2019-09-04 – 2019-09-10 (×21): 3.375 g via INTRAVENOUS
  Filled 2019-09-04 (×20): qty 50

## 2019-09-04 MED ORDER — ALBUMIN HUMAN 5 % IV SOLN
INTRAVENOUS | Status: DC | PRN
  Administered 2019-09-04 (×2): via INTRAVENOUS

## 2019-09-04 MED ORDER — LOPERAMIDE HCL 2 MG PO CAPS: 2.0000 mg | ORAL_CAPSULE | ORAL | Status: DC | PRN

## 2019-09-04 NOTE — H&P (Signed)
George Wade. is an 63 y.o. male.   Chief Complaint: right chest pain HPI: 63 yo man with history of ethanol abuse, non-Hodgkins lymphoma, tonsillar cancer, hypertension, hypercholesterolemia, PE and esophageal stricture. Presented to [facility] ED earlier with cc/o CP right sided, came on suddenly. Had been in ED last night with similar complaints and alcohol intoxication.  CT at [facility] showed right hydropneumothorax and extensive distal pneumomediastinum. WBC 31K, lactic acid 10. Started on IV antibiotics and fluid resuscitated.  Had esophageal dilatation about a month ago for benign stricture. Had a PEG tube placed after that.  Past Medical History:  Diagnosis Date  . Alcoholism with psychosis with complication, with hallucinations ([place])   . ED (erectile dysfunction)   . Fatigue   . History of pneumonia   . Hypercholesteremia   . Hypertension   . Lymphoma ([place]) 2008   non hodgkins lymphoma  . Rectal bleeding   . Substance abuse ([place])    ethanol  . 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[facility])     Past Surgical History:  Procedure Laterality Date  . ESOPHAGOGASTRODUODENOSCOPY N/A 05/18/2019   Procedure: ESOPHAGOGASTRODUODENOSCOPY (EGD);  Surgeon: Lin Landsman, MD;  Location: [facility] ENDOSCOPY;  Service: Gastroenterology;  Laterality: N/A;  . ESOPHAGOGASTRODUODENOSCOPY (EGD) WITH PROPOFOL N/A 08/02/2019   Procedure: ESOPHAGOGASTRODUODENOSCOPY (EGD) WITH PROPOFOL;  Surgeon: [name], MD;  Location: [facility] ENDOSCOPY;  Service: Endoscopy;  Laterality: N/A;  . [place]  . IR GASTROSTOMY TUBE MOD SED  08/06/2019  . IR GASTROSTOMY TUBE MOD SED  08/17/2019  . JOINT REPLACEMENT    . REPLACEMENT TOTAL KNEE BILATERAL  2009  . TONSILLECTOMY  2008   with neck dissection     Family History  Problem Relation Age of Onset  . Cancer Mother   . Hypertension Father   . Cancer Paternal Grandmother    Social History:  reports that he has never smoked. He has never used smokeless tobacco. He  reports current alcohol use of about 14.0 standard drinks of alcohol per week. He reports that he does not use drugs.  Allergies:  Allergies  Allergen Reactions  . Sulfa Antibiotics Nausea Only    Medications Prior to Admission  Medication Sig Dispense Refill  . acetaminophen (TYLENOL) 325 MG tablet Take 2 tablets (650 mg total) by mouth every 6 (six) hours as needed for mild pain (or Fever >/= 101).    Marland Kitchen apixaban (ELIQUIS) 5 MG TABS tablet Place 1 tablet (5 mg total) into feeding tube 2 (two) times daily. 60 tablet 0  . folic acid (FOLVITE) 1 MG tablet Place 1 tablet (1 mg total) into feeding tube daily. 30 tablet 0  . [EXPIRED] HYDROcodone-acetaminophen (NORCO/VICODIN) 5-325 MG tablet Take 1-2 tablets by mouth every 4 (four) hours as needed for up to 5 days for moderate pain. 30 tablet 0  . hydrocortisone cream 1 % Apply topically every 6 (six) hours as needed for itching. 30 g 0  . Nutritional Supplements (FEEDING SUPPLEMENT, OSMOLITE 1.5 CAL,) LIQD Place 474 mLs into feeding tube 3 (three) times daily. 42660 mL 0  . omeprazole (PRILOSEC) 20 MG capsule Take 1 capsule (20 mg total) by mouth 2 (two) times daily before a meal. Open capsule and empty granules into small amount of liquid.  Give by PEG tube twice daily. 60 capsule 0  . thiamine 100 MG tablet Place 1 tablet (100 mg total) into feeding tube daily. 30 tablet 0  . Water For Irrigation, Sterile (FREE WATER) SOLN Place 100 mLs  into feeding tube every 4 (four) hours.      Results for orders placed or performed during the hospital encounter of 09/03/19 (from the past 48 hour(s))  CBC with Differential     Status: Abnormal   Collection Time: 09/03/19  8:28 PM  Result Value Ref Range   WBC 31.5 (H) 4.0 - 10.5 K/uL   RBC 5.10 4.22 - 5.81 MIL/uL   Hemoglobin 13.4 13.0 - 17.0 g/dL   HCT 42.4 39.0 - 52.0 %   MCV 83.1 80.0 - 100.0 fL   MCH 26.3 26.0 - 34.0 pg   MCHC 31.6 30.0 - 36.0 g/dL   RDW 21.9 (H) 11.5 - 15.5 %   Platelets 569  (H) 150 - 400 K/uL    Comment: PLATELET CLUMPS NOTED ON SMEAR, UNABLE TO ESTIMATE   nRBC 0.0 0.0 - 0.2 %   Neutrophils Relative % 88 %   Neutro Abs 27.8 (H) 1.7 - 7.7 K/uL   Lymphocytes Relative 4 %   Lymphs Abs 1.1 0.7 - 4.0 K/uL   Monocytes Relative 7 %   Monocytes Absolute 2.2 (H) 0.1 - 1.0 K/uL   Eosinophils Relative 0 %   Eosinophils Absolute 0.0 0.0 - 0.5 K/uL   Basophils Relative 0 %   Basophils Absolute 0.1 0.0 - 0.1 K/uL   Immature Granulocytes 1 %   Abs Immature Granulocytes 0.31 (H) 0.00 - 0.07 K/uL    Comment: Performed at [facility], [address] [place], [place] [address]  Comprehensive metabolic panel     Status: Abnormal   Collection Time: 09/03/19  8:28 PM  Result Value Ref Range   Sodium 136 135 - 145 mmol/L    Comment: LYTES VERIFIED BY REPEAT TESTING/TFK   Potassium 4.1 3.5 - 5.1 mmol/L    Comment: HEMOLYSIS AT THIS LEVEL MAY AFFECT RESULT   Chloride 93 (L) 98 - 111 mmol/L   CO2 12 (L) 22 - 32 mmol/L   Glucose, Bld 183 (H) 70 - 99 mg/dL   BUN 14 8 - 23 mg/dL   Creatinine, Ser 0.98 0.61 - 1.24 mg/dL   Calcium 9.3 8.9 - 10.3 mg/dL   Total Protein 7.3 6.5 - 8.1 g/dL   Albumin 3.6 3.5 - 5.0 g/dL   AST 62 (H) 15 - 41 U/L   ALT 37 0 - 44 U/L   Alkaline Phosphatase 139 (H) 38 - 126 U/L   Total Bilirubin 1.2 0.3 - 1.2 mg/dL   GFR calc non Af Amer >60 >60 mL/min   GFR calc Af Amer >60 >60 mL/min   Anion gap 31 (H) 5 - 15    Comment: Performed at [facility], [address] [place], [place] [address]  Lipase, blood     Status: None   Collection Time: 09/03/19  8:28 PM  Result Value Ref Range   Lipase 22 11 - 51 U/L    Comment: Performed at [facility], [address] [place], [place] [address]  Lactic acid, plasma     Status: Abnormal   Collection Time: 09/03/19  9:21 PM  Result Value Ref Range   Lactic Acid, Venous 10.5 (HH) 0.5 - 1.9 mmol/L    Comment: CRITICAL RESULT CALLED TO, READ BACK BY AND VERIFIED WITH KATE  BUCKNUM AT 2207 09/03/2019  TFK Performed at [facility], [address] [place], [place] [address]   SARS Coronavirus 2 by RT PCR (hospital order, performed in [facility] hospital lab) Nasopharyngeal Nasopharyngeal  Swab     Status: None   Collection Time: 09/03/19 11:06 PM   Specimen: Nasopharyngeal Swab  Result Value Ref Range   SARS Coronavirus 2 NEGATIVE NEGATIVE    Comment: (NOTE) If result is NEGATIVE SARS-CoV-2 target nucleic acids are NOT DETECTED. The SARS-CoV-2 RNA is generally detectable in upper and lower  respiratory specimens during the acute phase of infection. The lowest  concentration of SARS-CoV-2 viral copies this assay can detect is 250  copies / mL. A negative result does not preclude SARS-CoV-2 infection  and should not be used as the sole basis for treatment or other  patient management decisions.  A negative result may occur with  improper specimen collection / handling, submission of specimen other  than nasopharyngeal swab, presence of viral mutation(s) within the  areas targeted by this assay, and inadequate number of viral copies  (<250 copies / mL). A negative result must be combined with clinical  observations, patient history, and epidemiological information. If result is POSITIVE SARS-CoV-2 target nucleic acids are DETECTED. The SARS-CoV-2 RNA is generally detectable in upper and lower  respiratory specimens dur ing the acute phase of infection.  Positive  results are indicative of active infection with SARS-CoV-2.  Clinical  correlation with patient history and other diagnostic information is  necessary to determine patient infection status.  Positive results do  not rule out bacterial infection or co-infection with other viruses. If result is PRESUMPTIVE POSTIVE SARS-CoV-2 nucleic acids MAY BE PRESENT.   A presumptive positive result was obtained on the submitted specimen  and confirmed on repeat testing.  While 2019 novel coronavirus   (SARS-CoV-2) nucleic acids may be present in the submitted sample  additional confirmatory testing may be necessary for epidemiological  and / or clinical management purposes  to differentiate between  SARS-CoV-2 and other Sarbecovirus currently known to infect humans.  If clinically indicated additional testing with an alternate test  methodology [phone]) is advised. The SARS-CoV-2 RNA is generally  detectable in upper and lower respiratory sp ecimens during the acute  phase of infection. The expected result is Negative. Fact Sheet for Patients:  StrictlyIdeas.no Fact Sheet for Healthcare Providers: BankingDealers.co.za This test is not yet approved or cleared by the [place] FDA and has been authorized for detection and/or diagnosis of SARS-CoV-2 by FDA under an Emergency Use Authorization (EUA).  This EUA will remain in effect (meaning this test can be used) for the duration of the COVID-19 declaration under Section 564(b)(1) of the Act, 21 U.S.C. section 360bbb-3(b)(1), unless the authorization is terminated or revoked sooner. Performed at [facility], [place], [place] [address]   POC SARS Coronavirus 2 Ag-ED - Nasal Swab (BD Veritor Kit)     Status: None   Collection Time: 09/03/19 11:12 PM  Result Value Ref Range   SARS Coronavirus 2 Ag Negative Negative   Dg Chest 2 View  Result Date: 09/03/2019 CLINICAL DATA:  Fall. EXAM: CHEST - 2 VIEW COMPARISON:  Chest radiograph 08/04/2019 FINDINGS: Heart size within normal limits. No airspace consolidation. No evidence of pneumothorax or sizable pleural effusion. No acute displaced fracture is identified. Subacute/chronic fracture of the distal left clavicle. IMPRESSION: No airspace consolidation. Subacute/chronic fracture of the distal left clavicle. Electronically Signed   By: Kellie Simmering DO   On: 09/03/2019 13:32   Ct Head Wo Contrast  Result Date:  09/03/2019 CLINICAL DATA:  Head injury after fall yesterday. EXAM: CT HEAD WITHOUT CONTRAST CT CERVICAL SPINE WITHOUT  CONTRAST TECHNIQUE: Multidetector CT imaging of the head and cervical spine was performed following the standard protocol without intravenous contrast. Multiplanar CT image reconstructions of the cervical spine were also generated. COMPARISON:  August 14, 2019. FINDINGS: CT HEAD FINDINGS Brain: Mild diffuse cortical atrophy is noted. Mild chronic ischemic white matter disease is noted. No mass effect or midline shift is noted. Ventricular size is within normal limits. There is no evidence of mass lesion, hemorrhage or acute infarction. Vascular: No hyperdense vessel or unexpected calcification. Skull: Normal. Negative for fracture or focal lesion. Sinuses/Orbits: No acute finding. Other: None. CT CERVICAL SPINE FINDINGS Alignment: Normal. Skull base and vertebrae: No acute fracture. No primary bone lesion or focal pathologic process. Soft tissues and spinal canal: No prevertebral fluid or swelling. No visible canal hematoma. Disc levels: Moderate degenerative disc disease is noted at C5-6 with anterior posterior osteophyte formation. Upper chest: Negative. Other: None. IMPRESSION: 1. Mild diffuse cortical atrophy. Mild chronic ischemic white matter disease. No acute intracranial abnormality seen. 2. Moderate degenerative disc disease is noted at C5-6. No acute abnormality seen in the cervical spine. Electronically Signed   By: Marijo Conception M.D.   On: 09/03/2019 13:30   Ct Chest Wo Contrast  Result Date: 09/03/2019 CLINICAL DATA:  Pneumothorax EXAM: CT CHEST WITHOUT CONTRAST TECHNIQUE: Multidetector CT imaging of the chest was performed following the standard protocol without IV contrast. COMPARISON:  CT 09/03/2019, chest x-ray 09/03/2019, CT chest 07/28/2019, 05/15/2019 FINDINGS: Cardiovascular: Some contrast present within the intravascular space from prior CT abdomen pelvis mild aortic  atherosclerosis. No aneurysm. Coronary vascular calcification. Normal heart size. No pericardial effusion Mediastinum/Nodes: Midline trachea. No thyroid mass. Subcentimeter mediastinal lymph nodes. Diffuse fluid within the esophagus. Indistinct distal esophagus and GE junction with edema and tiny fluid. Small foci of gas around the distal esophagus with moderate retrocrural air. Lungs/Pleura: Moderate right hydropneumothorax. Mild bronchiectasis in the right upper lobe with patchy ground-glass density. Mild ground-glass density and nodularity in the right lower lobe. Similar findings in the left lower lobe but to a lesser degree. 3 mm pulmonary nodule in the peripheral left lung base. Upper Abdomen: No additional findings Musculoskeletal: No chest wall mass or suspicious bone lesions identified. IMPRESSION: 1. Moderate right hydropneumothorax. Indistinct appearance of the distal esophagus with thickening and multiple small foci of gas adjacent to the thickened distal esophagus and moderate retrocrural gas. Collective findings are suspicious for distal esophageal perforation. 2. Redemonstrated ground-glass density and nodularity in the right upper, right lower and left lower lobes with scattered pulmonary nodules, possibly due to chronic atypical infection, similar findings on 05/15/2019 CT. Critical Value/emergent results were called by telephone at the time of interpretation on 09/03/2019 at 10:55 pm to providerCAMERON ISAACS , who verbally acknowledged these results. Aortic Atherosclerosis (ICD10-I70.0). Electronically Signed   By: Donavan Foil M.D.   On: 09/03/2019 22:55   Ct Cervical Spine Wo Contrast  Result Date: 09/03/2019 CLINICAL DATA:  Head injury after fall yesterday. EXAM: CT HEAD WITHOUT CONTRAST CT CERVICAL SPINE WITHOUT CONTRAST TECHNIQUE: Multidetector CT imaging of the head and cervical spine was performed following the standard protocol without intravenous contrast. Multiplanar CT image  reconstructions of the cervical spine were also generated. COMPARISON:  August 14, 2019. FINDINGS: CT HEAD FINDINGS Brain: Mild diffuse cortical atrophy is noted. Mild chronic ischemic white matter disease is noted. No mass effect or midline shift is noted. Ventricular size is within normal limits. There is no evidence of mass lesion, hemorrhage or acute infarction.  Vascular: No hyperdense vessel or unexpected calcification. Skull: Normal. Negative for fracture or focal lesion. Sinuses/Orbits: No acute finding. Other: None. CT CERVICAL SPINE FINDINGS Alignment: Normal. Skull base and vertebrae: No acute fracture. No primary bone lesion or focal pathologic process. Soft tissues and spinal canal: No prevertebral fluid or swelling. No visible canal hematoma. Disc levels: Moderate degenerative disc disease is noted at C5-6 with anterior posterior osteophyte formation. Upper chest: Negative. Other: None. IMPRESSION: 1. Mild diffuse cortical atrophy. Mild chronic ischemic white matter disease. No acute intracranial abnormality seen. 2. Moderate degenerative disc disease is noted at C5-6. No acute abnormality seen in the cervical spine. Electronically Signed   By: Marijo Conception M.D.   On: 09/03/2019 13:30   Ct Abdomen Pelvis W Contrast  Result Date: 09/03/2019 CLINICAL DATA:  Sudden onset of right-sided chest and abdominal pain EXAM: CT ABDOMEN AND PELVIS WITH CONTRAST TECHNIQUE: Multidetector CT imaging of the abdomen and pelvis was performed using the standard protocol following bolus administration of intravenous contrast. CONTRAST:  194m OMNIPAQUE IOHEXOL 300 MG/ML  SOLN COMPARISON:  07/28/2019 CT, chest x-ray from earlier in the same day. FINDINGS: Lower chest: Lung bases demonstrate evidence of a small anterior right pneumothorax as well as large right-sided pleural effusion. These changes were not seen on a chest x-ray from earlier in the same day. Hepatobiliary: Diffuse decreased attenuation is noted  consistent with fatty infiltration. The gallbladder is within normal limits. Pancreas: Unremarkable. No pancreatic ductal dilatation or surrounding inflammatory changes. Spleen: Normal in size without focal abnormality. Adrenals/Urinary Tract: Adrenal glands are unremarkable. Kidneys demonstrate a normal enhancement pattern bilaterally. No renal calculi or obstructive changes are seen. The bladder is partially distended. Stomach/Bowel: Prominent hemorrhoids are noted at the level of the anus. Scattered diverticular change of the colon is seen without evidence of diverticulitis. Gastrostomy catheter is seen in place. Changes of prior appendectomy are noted. No small bowel abnormality is seen. Vascular/Lymphatic: Aortic atherosclerosis. No enlarged abdominal or pelvic lymph nodes. Reproductive: Prostate is unremarkable. Other: No abdominal wall hernia or abnormality. No abdominopelvic ascites. Musculoskeletal: No acute or significant osseous findings. IMPRESSION: New right-sided pleural effusion and right anterior pneumothorax which were not present on a recent chest x-ray from approximately 9 hours ago. Although not mentioned in the body of the report there is some retrocrural air extending from adjacent to the distal esophagus. It is possible these changes as well as the pneumothorax could be related to esophageal injury. Correlation with any recent vomiting is recommended. Fatty infiltration of the liver. Chronic changes as described above. Electronically Signed   By: MInez CatalinaM.D.   On: 09/03/2019 22:37   [place]dom[place] Limited Ruq  Result Date: 09/03/2019 CLINICAL DATA:  Right upper quadrant pain EXAM: ULTRASOUND ABDOMEN LIMITED RIGHT UPPER QUADRANT COMPARISON:  08/18/2019 radiograph, ultrasound 07/28/2019 FINDINGS: Gallbladder: No gallstones or wall thickening visualized. No sonographic Murphy sign noted by sonographer. Common bile duct: Diameter: 3 mm Liver: Increased hepatic echogenicity without focal  hepatic abnormality. Portal vein is patent on color Doppler imaging with normal direction of blood flow towards the liver. Other: Possible small right pleural effusion IMPRESSION: 1. Negative for gallstones or biliary dilatation 2. Echogenic liver consistent with steatosis and or hepatocellular disease 3. Possible right pleural effusion Electronically Signed   By: KDonavan FoilM.D.   On: 09/03/2019 21:40    Review of Systems  Cardiovascular: Positive for chest pain.  Gastrointestinal: Positive for vomiting.    There were no vitals taken for  this visit. Physical Exam  Constitutional: He is oriented to person, place, and time. He appears well-developed and well-nourished. He appears distressed.  Eyes: Conjunctivae and EOM are normal. No scleral icterus.  Neck: Neck supple. No tracheal deviation present. No thyromegaly present.  Cardiovascular: Regular rhythm and normal heart sounds.  No murmur heard. tachycardic  Respiratory: He has no wheezes.  Diminished BS right side  GI: Soft. There is abdominal tenderness (mild right sided, no peritoneal signs).  Musculoskeletal:        General: No edema.  Lymphadenopathy:    He has no cervical adenopathy.  Neurological: He is alert and oriented to person, place, and time. A cranial nerve deficit is present.  Skin:  Cool, dry     Assessment/Plan 63 yo man with history of ethanol abuse with psychosis, esophageal stricture, tonsillar cancer, and non-Hodgkins lymphoma who presents with right sided CP and shortness of breath after vomiting last night. 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 Needs urgent surgical exploration for drainage, possible repair or resection. As the contamination is in the right chest we will use right thoracotomy approach.   I informed him of the natural history of esophageal perforation with extremely high mortality if not treated urgently and still significant mortality even  with optimal treatment. I described the proposed operation to him. I informed him of the indications, risks, benefits and alternatives. He understands the risks include but are not limited to death, MI, DVT, PE, bleeding, possible need for transfusion, infection, respiratory or renal failure, as well as other unforeseeable complications.  He accepts the risks and agrees to proceed.  He has not taken Eliquis in over 24 hours.  Melrose Nakayama, MD 09/04/2019, 12:24 AM

## 2019-09-04 NOTE — Transfer of Care (Signed)
Immediate Anesthesia Transfer of Care Note  Patient: Draven Natter.  Procedure(s) Performed: THORACOTOMY MAJOR FOR ESOPHAGEAL PERFORATION WITH INTERCOSTAL MUSCLE FLAP (Right Chest)  Patient Location: ICU  Anesthesia Type:General  Level of Consciousness: sedated  Airway & Oxygen Therapy: Patient remains intubated per anesthesia plan and Patient placed on Ventilator (see vital sign flow sheet for setting)  Post-op Assessment: Report given to RN and Post -op Vital signs reviewed and stable  Post vital signs: Reviewed and stable  Last Vitals:  Vitals Value Taken Time  BP    Temp    Pulse 95 09/04/19 0507  Resp 17 09/04/19 0507  SpO2 100 % 09/04/19 0507  Vitals shown include unvalidated device data.  Last Pain: There were no vitals filed for this visit.       Complications: No apparent anesthesia complications

## 2019-09-04 NOTE — Progress Notes (Signed)
This RN called [facility] in [place], [place] to confirm that George Wade's belongings were safe.  Receptionist informed this RN that his clothing, laptop, mobile device, and medications were all in storage at the motel. He can retrieve them when he is discharged.

## 2019-09-04 NOTE — Progress Notes (Signed)
Initial Nutrition Assessment  DOCUMENTATION CODES:   Non-severe (moderate) malnutrition in context of social or environmental circumstances  INTERVENTION:   If unable start tube feeding via PEG recommend initiation of TPN as pt is malnourished.   Tube feeding:  -Osmolite 1.5 @ 20 ml/hr via PEG -Increase by 10 ml Q4 hours to goal rate of 60 ml/hr (1440 ml) -30 ml Prostat BID  At goal TF provides: 2360 kcals, 120 grams protein, 1097 ml free water. Meets 100% of needs.   NUTRITION DIAGNOSIS:   Moderate Malnutrition related to social / environmental circumstances(homeless, ETOH abuse) as evidenced by moderate muscle depletion, moderate fat depletion, mild fat depletion.  GOAL:   Patient will meet greater than or equal to 90% of their needs  MONITOR:   Weight trends, Labs, I & O's, TF tolerance, Skin  REASON FOR ASSESSMENT:   Malnutrition Screening Tool    ASSESSMENT:   Patient with PMH significant for ETOH abuse, non-hodgkin's lymphoma, tonsillar cancer, esophogeal stricture s/p PEG, and HTN. Presents this admission with esophageal perforation.   11/24- R thoracotomy, repair of perforation, extubated   Pt discussed during ICU rounds and with RN.   Unable to obtain history from pt. Home TF regimen- bolus feedings Osmolite 1.5 six cans daily (2 cans at a time). Unsure of compliance. Pt currently with NGT to suction. If TF unable to be started in near future recommend initiation of TPN. Once medically able will start continuous feedings to lessen volume and transition to bolus as tolerated.   Records indicate pt weighed 78 kg on 10/6 and 77.1 kg this admission (insignificant for time frame).   I/O: +2,098 ml since admit UOP: 825 ml x 24 hrs  Chest tubes: 130 ml x 24 hrs    Drips: D5 in 1/2 NS @ 125 ml/hr Medications: dulcolax, folic acid, SS novolog, senokot, thiamine  Labs: CBG 128-173    NUTRITION - FOCUSED PHYSICAL EXAM:    Most Recent Value  Orbital Region   Moderate depletion  Upper Arm Region  Moderate depletion  Thoracic and Lumbar Region  Mild depletion  Buccal Region  Mild depletion  Temple Region  Moderate depletion  Clavicle Bone Region  Moderate depletion  Clavicle and Acromion Bone Region  Mild depletion  Scapular Bone Region  Unable to assess  Dorsal Hand  Unable to assess  Patellar Region  Moderate depletion  Anterior Thigh Region  Moderate depletion  Posterior Calf Region  Moderate depletion  Edema (RD Assessment)  None  Hair  Reviewed  Eyes  Reviewed  Mouth  Reviewed  Skin  Reviewed  Nails  Reviewed       Diet Order:   Diet Order            Diet NPO time specified  Diet effective now              EDUCATION NEEDS:   Not appropriate for education at this time  Skin:  Skin Assessment: Skin Integrity Issues: Skin Integrity Issues:: Incisions, Other (Comment) Incisions: chest, abdomen Other: MASD- buttocks  Last BM:  PTA  Height:   Ht Readings from Last 1 Encounters:  09/04/19 5\' 8"  (1.727 m)    Weight:   Wt Readings from Last 1 Encounters:  09/04/19 77.1 kg    Ideal Body Weight:  70 kg  BMI:  Body mass index is 25.84 kg/m.  Estimated Nutritional Needs:   Kcal:  2200-2400 kcal  Protein:  110-125 grams  Fluid:  >/= 2.2 L/day  Mariana Single RD, LDN Clinical Nutrition Pager # [phone]

## 2019-09-04 NOTE — Progress Notes (Signed)
Pharmacy Antibiotic Note  George Wade. is a 63 y.o. male admitted on 09/03/2019 with sepsis secondary to perforated/necrotic esophagus.  Pharmacy has been consulted for Vancomycin/Zosyn dosing. WBC markedly elevated. Renal function function ok, but a little up from apparent baseline.   Plan: Vancomycin 1000 mg IV q12h >>Estimated AUC: 544 Zosyn 3.375G IV q8h to be infused over 4 hours Trend WBC, temp, renal function  F/U infectious work-up Drug levels as indicated  Temp (24hrs), Avg:98.1 F (36.7 C), Min:97.6 F (36.4 C), Max:98.8 F (37.1 C)  Recent Labs  Lab 08/29/19 0434 09/03/19 1112 09/03/19 2028 09/03/19 2121  WBC 5.2 12.4* 31.5*  --   CREATININE 0.56* 0.45* 0.98  --   LATICACIDVEN  --   --   --  10.5*    Estimated Creatinine Clearance: 74.6 mL/min (by C-G formula based on SCr of 0.98 mg/dL).    Allergies  Allergen Reactions  . Sulfa Antibiotics Nausea Only    Narda Bonds, PharmD, BCPS Clinical Pharmacist Phone: [phone]

## 2019-09-04 NOTE — Anesthesia Preprocedure Evaluation (Addendum)
Anesthesia Evaluation  Patient identified by MRN, date of birth, ID band Patient awake    Reviewed: Allergy & Precautions, NPO status , Patient's Chart, lab work & pertinent test resultsPreop documentation limited or incomplete due to emergent nature of procedure.  Airway Mallampati: II  TM Distance: >3 FB Neck ROM: Full    Dental  (+) Teeth Intact, Dental Advisory Given, Poor Dentition, Chipped,    Pulmonary PE (Eliquis)   Pulmonary exam normal breath sounds clear to auscultation       Cardiovascular hypertension,  Rhythm:Regular Rate:Tachycardia     Neuro/Psych negative neurological ROS     GI/Hepatic (+)     substance abuse  alcohol use, Esophageal Perforation   Endo/Other  negative endocrine ROS  Renal/GU negative Renal ROS     Musculoskeletal negative musculoskeletal ROS (+)   Abdominal   Peds  Hematology negative hematology ROS (+)   Anesthesia Other Findings Day of surgery medications reviewed with the patient.  Reproductive/Obstetrics                             Anesthesia Physical Anesthesia Plan  ASA: V and emergent  Anesthesia Plan: General   Post-op Pain Management:    Induction: Intravenous  PONV Risk Score and Plan: 2 and Midazolam, Dexamethasone and Ondansetron  Airway Management Planned: Oral ETT and Double Lumen EBT  Additional Equipment: Arterial line, Ultrasound Guidance Line Placement and CVP  Intra-op Plan:   Post-operative Plan: Post-operative intubation/ventilation  Informed Consent: I have reviewed the patients History and Physical, chart, labs and discussed the procedure including the risks, benefits and alternatives for the proposed anesthesia with the patient or authorized representative who has indicated his/her understanding and acceptance.     Dental advisory given and Only emergency history available  Plan Discussed with: CRNA   Anesthesia Plan Comments:        Anesthesia Quick Evaluation

## 2019-09-04 NOTE — Progress Notes (Signed)
[name]., male   DOB: 05-22-1956, 63 y.o.   MRN: [id number] EVENING ROUNDS NOTE :     [address]       [place] [address]             [phone]                 1 Day Post-Op Procedure(s) (LRB): THORACOTOMY MAJOR FOR ESOPHAGEAL PERFORATION WITH INTERCOSTAL MUSCLE FLAP (Right)  Total Length of Stay:  LOS: 1 day  BP (!) 130/95   Pulse 88   Temp 97.6 F (36.4 C) (Oral)   Resp 18   Ht 5\' 8"  (1.727 m)   Wt 77.1 kg   SpO2 100%   BMI 25.84 kg/m   .Intake/Output      11/24 0701 - 11/25 0700   I.V. (mL/kg) 1037 (13.4)   IV Piggyback 298.7   Total Intake(mL/kg) 1335.7 (17.3)   Urine (mL/kg/hr) 340 (0.4)   Emesis/NG output    Drains    Blood    Chest Tube 380   Total Output 720   Net +615.7         . sodium chloride    . sodium chloride Stopped (09/04/19 0643)  . dexmedetomidine (PRECEDEX) IV infusion Stopped (09/04/19 1300)  . dextrose 5 % and 0.45% NaCl 100 mL/hr at 09/04/19 1827  . piperacillin-tazobactam (ZOSYN)  IV 12.5 mL/hr at 09/04/19 1800  . vancomycin Stopped (09/04/19 1209)     Lab Results  Component Value Date   WBC 11.6 (H) 09/04/2019   HGB 10.2 (L) 09/04/2019   HCT 30.0 (L) 09/04/2019   PLT 257 09/04/2019   GLUCOSE 134 (H) 09/04/2019   ALT 37 09/03/2019   AST 62 (H) 09/03/2019   NA 137 09/04/2019   K 4.0 09/04/2019   CL 105 09/04/2019   CREATININE 0.51 (L) 09/04/2019   BUN 9 09/04/2019   CO2 20 (L) 09/04/2019   TSH 1.186 12/09/2017   INR 1.2 08/17/2019   Extubated  Alert and cooperative , no withdrawel symptoms currently   Grace Isaac MD  Beeper [phone] Office [phone] 09/04/2019 7:14 PM

## 2019-09-04 NOTE — Procedures (Signed)
Extubation Procedure Note  Patient Details:   Name: George Wade. DOB: 12/15/1955 MRN: [id number]   Airway Documentation:    Vent end date: 09/04/19 Vent end time: 1302   Evaluation  O2 sats: stable throughout Complications: No apparent complications Patient did tolerate procedure well. Bilateral Breath Sounds: Clear, Diminished   Yes   Pt extubated to 2 Lpm George Wade. No stridor noted. RN at bedside. VC 1290, NIF -20.   Donnetta Hail 09/04/2019, 3:48 PM

## 2019-09-04 NOTE — Anesthesia Procedure Notes (Signed)
Central Venous Catheter Insertion Performed by: Catalina Gravel, MD, anesthesiologist Start/End11/24/2020 12:10 AM, 09/04/2019 12:20 AM Preanesthetic checklist: patient identified, IV checked, site marked, risks and benefits discussed, surgical consent, monitors and equipment checked, pre-op evaluation, timeout performed and anesthesia consent Position: Trendelenburg Lidocaine 1% used for infiltration and patient sedated Hand hygiene performed , maximum sterile barriers used  and Seldinger technique used Catheter size: 8 Fr Total catheter length 16. Central line was placed.Double lumen Procedure performed using ultrasound guided technique. Ultrasound Notes:anatomy identified, needle tip was noted to be adjacent to the nerve/plexus identified, no ultrasound evidence of intravascular and/or intraneural injection and image(s) printed for medical record Attempts: 1 Following insertion, line sutured, dressing applied and Biopatch. Post procedure assessment: blood return through all ports, free fluid flow and no air  Patient tolerated the procedure well with no immediate complications.

## 2019-09-04 NOTE — Anesthesia Procedure Notes (Signed)
Arterial Line Insertion Start/End11/24/2020 12:30 AM, 09/04/2019 12:45 AM Performed by: Clovis Cao, CRNA, CRNA  Patient location: OOR procedure area. radial was placed Catheter size: 20 G Hand hygiene performed  and maximum sterile barriers used   Attempts: 1 Procedure performed without using ultrasound guided technique. Ultrasound Notes:anatomy identified, needle tip was noted to be adjacent to the nerve/plexus identified and no ultrasound evidence of intravascular and/or intraneural injection Following insertion, dressing applied and Biopatch. Patient tolerated the procedure well with no immediate complications.

## 2019-09-04 NOTE — Interval H&P Note (Signed)
History and Physical Interval Note:  09/04/2019 12:46 AM  George Wade.  has presented today for surgery, with the diagnosis of Esophageal Perforation.  The various methods of treatment have been discussed with the patient and family. After consideration of risks, benefits and other options for treatment, the patient has consented to  Procedure(s): THORACOTOMY MAJOR (Right) as a surgical intervention.  The patient's history has been reviewed, patient examined, no change in status, stable for surgery.  I have reviewed the patient's chart and labs.  Questions were answered to the patient's satisfaction.     Melrose Nakayama

## 2019-09-04 NOTE — Anesthesia Procedure Notes (Addendum)
Procedure Name: Intubation Date/Time: 09/04/2019 1:11 AM Performed by: Clovis Cao, CRNA Pre-anesthesia Checklist: Patient identified, Emergency Drugs available, Suction available, Patient being monitored and Timeout performed Patient Re-evaluated:Patient Re-evaluated prior to induction Oxygen Delivery Method: Circle system utilized Preoxygenation: Pre-oxygenation with 100% oxygen Induction Type: IV induction, Rapid sequence and Cricoid Pressure applied Laryngoscope Size: Mac and 3 Grade View: Grade I Tube type: Oral Endobronchial tube: Left, EBT position confirmed by auscultation and Double lumen EBT and 39 Fr Number of attempts: 1 Airway Equipment and Method: Stylet Placement Confirmation: ETT inserted through vocal cords under direct vision,  positive ETCO2 and breath sounds checked- equal and bilateral Secured at: 31 cm Tube secured with: Tape Dental Injury: Teeth and Oropharynx as per pre-operative assessment

## 2019-09-04 NOTE — Brief Op Note (Addendum)
09/03/2019 - 09/04/2019  4:29 AM  PATIENT:  George Wade.  63 y.o. male  PRE-OPERATIVE DIAGNOSIS:  Esophageal Rupture  POST-OPERATIVE DIAGNOSIS:  Esophageal Rupture  PROCEDURE:  Procedure(s): RIGHT THORACOTOMY  MEDIASTINAL DEBRIDEMENT PRIMARY REPAIR OF ESOPHAGEAL PERFORATION WITH INTERCOSTAL MUSCLE FLAP (Right)  SURGEON:  Surgeon(s) and Role:    * Melrose Nakayama, MD - Primary  PHYSICIAN ASSISTANT: Enid Cutter, PA  ASSISTANTS: OR Staff.   ANESTHESIA:   general  EBL:  100 mL   BLOOD ADMINISTERED:none  DRAINS: 40fr Blake drain and 57fr Blake drains x 2 to right pleural space   LOCAL MEDICATIONS USED:  NONE  SPECIMEN:  Source of Specimen:  Necrotic esophogeal wall  DISPOSITION OF SPECIMEN:  PATHOLOGY  COUNTS:  YES  DICTATION: .Dragon Dictation  PLAN OF CARE: Admit to inpatient   PATIENT DISPOSITION:  ICU - intubated and hemodynamically stable.   Delay start of Pharmacological VTE agent (>24hrs) due to surgical blood loss or risk of bleeding: no

## 2019-09-04 NOTE — Progress Notes (Signed)
Pt extubated at 1302 by RT. Pt tolerated well. RN will continue to monitor.

## 2019-09-05 ENCOUNTER — Inpatient Hospital Stay (HOSPITAL_COMMUNITY): Payer: Self-pay

## 2019-09-05 ENCOUNTER — Encounter (HOSPITAL_COMMUNITY): Payer: Self-pay | Admitting: Interventional Radiology

## 2019-09-05 HISTORY — PX: IR GASTR TUBE CONVERT GASTR-JEJ PER W/FL MOD SED: IMG2332

## 2019-09-05 LAB — BASIC METABOLIC PANEL
Anion gap: 4 — ABNORMAL LOW (ref 5–15)
BUN: <5 mg/dL — ABNORMAL LOW (ref 8–23)
CO2: 26 mmol/L (ref 22–32)
Calcium: 7.9 mg/dL — ABNORMAL LOW (ref 8.9–10.3)
Chloride: 106 mmol/L (ref 98–111)
Creatinine, Ser: 0.43 mg/dL — ABNORMAL LOW (ref 0.61–1.24)
GFR calc Af Amer: >60 mL/min (ref >60)
GFR calc non Af Amer: >60 mL/min (ref >60)
Glucose, Bld: 138 mg/dL — ABNORMAL HIGH (ref 70–99)
Potassium: 3.3 mmol/L — ABNORMAL LOW (ref 3.5–5.1)
Sodium: 136 mmol/L (ref 135–145)

## 2019-09-05 LAB — CBC
HCT: 28.3 % — ABNORMAL LOW (ref 39.0–52.0)
Hemoglobin: 8.7 g/dL — ABNORMAL LOW (ref 13.0–17.0)
MCH: 26.3 pg (ref 26.0–34.0)
MCHC: 30.7 g/dL (ref 30.0–36.0)
MCV: 85.5 fL (ref 80.0–100.0)
Platelets: 204 10*3/uL (ref 150–400)
RBC: 3.31 MIL/uL — ABNORMAL LOW (ref 4.22–5.81)
RDW: 20.3 % — ABNORMAL HIGH (ref 11.5–15.5)
WBC: 7.8 10*3/uL (ref 4.0–10.5)
nRBC: 0 % (ref 0.0–0.2)

## 2019-09-05 LAB — COMPREHENSIVE METABOLIC PANEL
ALT: 19 U/L (ref 0–44)
AST: 22 U/L (ref 15–41)
Albumin: 2.2 g/dL — ABNORMAL LOW (ref 3.5–5.0)
Alkaline Phosphatase: 65 U/L (ref 38–126)
Anion gap: 5 (ref 5–15)
BUN: 6 mg/dL — ABNORMAL LOW (ref 8–23)
CO2: 27 mmol/L (ref 22–32)
Calcium: 8 mg/dL — ABNORMAL LOW (ref 8.9–10.3)
Chloride: 104 mmol/L (ref 98–111)
Creatinine, Ser: 0.42 mg/dL — ABNORMAL LOW (ref 0.61–1.24)
GFR calc Af Amer: >60 mL/min (ref >60)
GFR calc non Af Amer: >60 mL/min (ref >60)
Glucose, Bld: 154 mg/dL — ABNORMAL HIGH (ref 70–99)
Potassium: 2.8 mmol/L — ABNORMAL LOW (ref 3.5–5.1)
Sodium: 136 mmol/L (ref 135–145)
Total Bilirubin: 1.2 mg/dL (ref 0.3–1.2)
Total Protein: 4.5 g/dL — ABNORMAL LOW (ref 6.5–8.1)

## 2019-09-05 LAB — GLUCOSE, CAPILLARY
Glucose-Capillary: 118 mg/dL — ABNORMAL HIGH (ref 70–99)
Glucose-Capillary: 147 mg/dL — ABNORMAL HIGH (ref 70–99)
Glucose-Capillary: 152 mg/dL — ABNORMAL HIGH (ref 70–99)
Glucose-Capillary: 88 mg/dL (ref 70–99)
Glucose-Capillary: 91 mg/dL (ref 70–99)
Glucose-Capillary: 96 mg/dL (ref 70–99)

## 2019-09-05 LAB — MAGNESIUM: Magnesium: 1.6 mg/dL — ABNORMAL LOW (ref 1.7–2.4)

## 2019-09-05 LAB — SURGICAL PATHOLOGY

## 2019-09-05 MED ORDER — LIDOCAINE HCL 1 % IJ SOLN
INTRAMUSCULAR | Status: AC
  Filled 2019-09-05: qty 20

## 2019-09-05 MED ORDER — IOHEXOL 300 MG/ML  SOLN
50.0000 mL | Freq: Once | INTRAMUSCULAR | Status: AC | PRN
  Administered 2019-09-05: 16:00:00 15 mL

## 2019-09-05 MED ORDER — LIDOCAINE VISCOUS HCL 2 % MT SOLN
OROMUCOSAL | Status: AC
  Filled 2019-09-05: qty 15

## 2019-09-05 MED ORDER — MAGNESIUM SULFATE 2 GM/50ML IV SOLN
2.0000 g | Freq: Once | INTRAVENOUS | Status: AC
  Administered 2019-09-05: 2 g via INTRAVENOUS
  Filled 2019-09-05: qty 50

## 2019-09-05 MED ORDER — PHENYLEPHRINE HCL-NACL 10-0.9 MG/250ML-% IV SOLN
INTRAVENOUS | Status: AC
  Filled 2019-09-05: qty 250

## 2019-09-05 MED ORDER — PHENYLEPHRINE HCL-NACL 10-0.9 MG/250ML-% IV SOLN
0.0000 ug/min | INTRAVENOUS | Status: DC
  Administered 2019-09-05: 10 ug/min via INTRAVENOUS

## 2019-09-05 MED ORDER — POTASSIUM CHLORIDE 10 MEQ/50ML IV SOLN
10.0000 meq | INTRAVENOUS | Status: AC
  Administered 2019-09-05 (×3): 10 meq via INTRAVENOUS
  Filled 2019-09-05 (×3): qty 50

## 2019-09-05 MED ORDER — POTASSIUM CHLORIDE 10 MEQ/50ML IV SOLN
10.0000 meq | INTRAVENOUS | Status: AC
  Administered 2019-09-05 (×3): 10 meq via INTRAVENOUS
  Filled 2019-09-05: qty 50

## 2019-09-05 NOTE — Progress Notes (Signed)
CIWA score increased to 14, patient confused, agitated, and having severe hallucinations trying to pull out lines and tubes. Placed in bilateral mitts   Notified Dr. Madolyn Frieze. Orders to restart precedex gtt with RASS score of -1

## 2019-09-05 NOTE — Progress Notes (Signed)
CSW received consult for patient for homelessness. CSW completed chart review and this patient has been provided with abundant resources for housing by several [facility] staff members at [facility]. This patient has personal transportation, an income, and chooses to stay at extended stay motels. Please consult CSW if social work intervention is needed.  Madilyn Fireman, MSW, LCSW-A Transitions of Care  Clinical Social Worker  [facility]  Medical ICU [phone]

## 2019-09-05 NOTE — Progress Notes (Signed)
CRITICAL VALUE ALERT  Critical Value:  Yeast growth in culture from right lung tissue   Date & Time Notied:  09/05/2019  At 1109  Provider Notified: paged MD [name]   Orders Received/Actions taken: Received return call from MD [name], MD requested evening TCTS rounding physician be notified.

## 2019-09-05 NOTE — Procedures (Signed)
Interventional Radiology Procedure Note  Procedure: Conversion of gastrostomy tube to gastrojejunal tube  Complications: None  Estimated Blood Loss: None  Findings: 16 Fr balloon retention gastrostomy tube removed. 18 Fr balloon retention G-J tube placed over wire with tip in proximal jejunum. OK to use.  Venetia Night. Kathlene Cote, M.D Pager:  [phone]

## 2019-09-05 NOTE — Progress Notes (Signed)
2 Days Post-Op Procedure(s) (LRB): THORACOTOMY MAJOR FOR ESOPHAGEAL PERFORATION WITH INTERCOSTAL MUSCLE FLAP (Right) Subjective: Intermittently confused and trying to get out of bed precedex restarted last night, but held due to low BP  Objective: Vital signs in last 24 hours: Temp:  [97.6 F (36.4 C)-98.4 F (36.9 C)] 98.4 F (36.9 C) (11/25 0745) Pulse Rate:  [58-115] 72 (11/25 0745) Cardiac Rhythm: Sinus tachycardia (11/25 0404) Resp:  [11-23] 13 (11/25 0745) BP: (78-143)/(63-103) 78/63 (11/25 0745) SpO2:  [94 %-100 %] 100 % (11/25 0745) Arterial Line BP: (52-174)/(44-92) 83/49 (11/25 0745) FiO2 (%):  [40 %-60 %] 40 % (11/24 1213) Weight:  [77.1 kg] 77.1 kg (11/24 1523)  Hemodynamic parameters for last 24 hours:    Intake/Output from previous day: 11/24 0701 - 11/25 0700 In: 2950.4 [I.V.:2274.6; IV Piggyback:625.9] Out: 1485 [Urine:810; Drains:25; Chest Tube:650] Intake/Output this shift: Total I/O In: -  Out: 330 [Urine:250; Chest Tube:80]  General appearance: cooperative and no distress Neurologic: some confusion but O x 2 currently, no focal motor deficit Heart: regular rate and rhythm Lungs: rhonchi bilaterally Abdomen: soft and nontender  Lab Results: Recent Labs    09/04/19 0537 09/04/19 1241 09/05/19 0300  WBC 11.6*  --  7.8  HGB 9.3* 10.2* 8.7*  HCT 31.1* 30.0* 28.3*  PLT 257  --  204   BMET:  Recent Labs    09/04/19 0537 09/04/19 1241 09/05/19 0300  NA 139 137 136  K 4.2 4.0 2.8*  CL 105  --  104  CO2 20*  --  27  GLUCOSE 134*  --  154*  BUN 9  --  6*  CREATININE 0.51*  --  0.42*  CALCIUM 7.6*  --  8.0*    PT/INR: No results for input(s): LABPROT, INR in the last 72 hours. ABG    Component Value Date/Time   PHART 7.426 09/04/2019 1241   HCO3 26.5 09/04/2019 1241   TCO2 28 09/04/2019 1241   ACIDBASEDEF 6.0 (H) 09/04/2019 0522   O2SAT 99.0 09/04/2019 1241   CBG (last 3)  Recent Labs    09/04/19 2322 09/05/19 0422 09/05/19 0807   GLUCAP 123* 147* 88    Assessment/Plan: S/P Procedure(s) (LRB): THORACOTOMY MAJOR FOR ESOPHAGEAL PERFORATION WITH INTERCOSTAL MUSCLE FLAP (Right) POD # 2 repair esophageal rupture  CV- BP Ok at baseline and in SR, but BP drops with precedex- will use neosynephrine PRN   RESP_ good sats on 2L [place], cooperation with IS an issue  RENAL- creatinine normal, hypokalemia- replace K  ENDO- CBG OK  ID- afebrile and normal WBC on Vanco and Zosyn for esophageal rupture with mediastinitis  GI- s/p esophageal rupture- NPO,   Will ask IR to convert PEG to PEG-J to start TF   May need to start TNA depending on timing of tube change   LOS: 2 days    Melrose Nakayama 09/05/2019

## 2019-09-05 NOTE — H&P (Signed)
Chief Complaint: Patient was seen in consultation today for G to Cobre conversion.   Referring Physician(s): Dr. [name]  Supervising Physician: Aletta Edouard  Patient Status: [facility] - In-pt  History of Present Illness: Devan Danzer. is a 63 y.o. male with a past medical history significant for HTN, HLD, tonsillar cancer, lymphoma, PE on Eliquis, esophageal stenosis s/p G-tube placement in IR on 08/06/19 and ETOH abuse who presented to [facility] ED on 09/03/19 with complaints of chest pain and abdominal pain. He had previously been seen in the ED earlier that day after being escorted by police from the hotel he lived at after he was found intoxicated, naked with what appeared to be feces spread around the room and being disruptive to staff and other guests. He complained of left sided chest pain and being homeless. CT head, CT c-spine and CXR showed no acute changes. He requested to leave at that time and was discharged to home. He then presented to the ED again approximately 8 hours later with sudden onset right flank pain, chest pain, abdominal pain and dyspnea - he reported that after leaving the hospital earlier that day he continued to drink and began to vomit multiple times. CT chest/abd/pelvis significant for new right sided pleural effusion, right anterior pneumothorax (new from CXR earlier that day) as well as retrocrural air extending from adjacent to the distal esophagus - possibly due to esophageal injury. Cardiothoracic surgery was consulted and patient was emergently transferred to [facility] and taken to the OR with Dr. [name] where he underwent a right thoracotomy, mediastinal debridement and primary repair of esophageal perforation with intercostal muscle flap. He was extubated on 11/24 but continued to require intermittent sedation due to agitation. He is currently NPO with NG in place - IR has been asked to convert the existing G tube to a GJ tube to start tube feeds.  Patient  seen in his room, does not respond to verbal or tactile cues - per RN has been requiring sedation this afternoon due to agitation. No family at bedside, discussed procedure with patient's sister via phone who agrees to conversion.   Past Medical History:  Diagnosis Date   Alcoholism with psychosis with complication, with hallucinations [facility])    ED (erectile dysfunction)    Fatigue    History of pneumonia    Hypercholesteremia    Hypertension    Lymphoma ([place]) 2008   non hodgkins lymphoma   Rectal bleeding    Substance abuse ([place])    ethanol   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([place])     Past Surgical History:  Procedure Laterality Date   ESOPHAGOGASTRODUODENOSCOPY N/A 05/18/2019   Procedure: ESOPHAGOGASTRODUODENOSCOPY (EGD);  Surgeon: Lin Landsman, MD;  Location: [facility] ENDOSCOPY;  Service: Gastroenterology;  Laterality: N/A;   ESOPHAGOGASTRODUODENOSCOPY (EGD) WITH PROPOFOL N/A 08/02/2019   Procedure: ESOPHAGOGASTRODUODENOSCOPY (EGD) WITH PROPOFOL;  Surgeon: [name], MD;  Location: [facility] ENDOSCOPY;  Service: Endoscopy;  Laterality: N/A;   HERNIA REPAIR  1958   IR GASTROSTOMY TUBE MOD SED  08/06/2019   IR GASTROSTOMY TUBE MOD SED  08/17/2019   JOINT REPLACEMENT     REPLACEMENT TOTAL KNEE BILATERAL  2009   THORACOTOMY Right 09/03/2019   Procedure: THORACOTOMY MAJOR FOR ESOPHAGEAL PERFORATION WITH INTERCOSTAL MUSCLE FLAP;  Surgeon: Melrose Nakayama, MD;  Location: [facility] OR;  Service: Thoracic;  Laterality: Right;   TONSILLECTOMY  2008   with neck dissection     Allergies: Sulfa antibiotics  Medications: Prior to Admission medications  Medication Sig Start Date End Date Taking? Authorizing Provider  acetaminophen (TYLENOL) 325 MG tablet Take 2 tablets (650 mg total) by mouth every 6 (six) hours as needed for mild pain (or Fever >/= 101). 08/29/19   Ezekiel Slocumb, DO  apixaban (ELIQUIS) 5 MG TABS tablet Place 1 tablet (5 mg total) into feeding tube 2 (two) times  daily. 08/29/19   Ezekiel Slocumb, DO  folic acid (FOLVITE) 1 MG tablet Place 1 tablet (1 mg total) into feeding tube daily. 08/30/19   Nicole Kindred A, DO  hydrocortisone cream 1 % Apply topically every 6 (six) hours as needed for itching. 08/29/19   Ezekiel Slocumb, DO  Nutritional Supplements (FEEDING SUPPLEMENT, OSMOLITE 1.5 CAL,) LIQD Place 474 mLs into feeding tube 3 (three) times daily. 08/29/19 09/28/19  Ezekiel Slocumb, DO  omeprazole (PRILOSEC) 20 MG capsule Take 1 capsule (20 mg total) by mouth 2 (two) times daily before a meal. Open capsule and empty granules into small amount of liquid.  Give by PEG tube twice daily. 08/29/19 09/28/19  Nicole Kindred A, DO  thiamine 100 MG tablet Place 1 tablet (100 mg total) into feeding tube daily. 08/30/19   Ezekiel Slocumb, DO  Water For Irrigation, Sterile (FREE WATER) SOLN Place 100 mLs into feeding tube every 4 (four) hours. 08/29/19   Ezekiel Slocumb, DO     Family History  Problem Relation Age of Onset   Cancer Mother    Hypertension Father    Cancer Paternal Grandmother     Social History   Socioeconomic History   Marital status: Divorced    Spouse name: Not on file   Number of children: Not on file   Years of education: Not on file   Highest education level: Not on file  Occupational History   Not on file  Social Needs   Financial resource strain: Not on file   Food insecurity    Worry: Not on file    Inability: Not on file   Transportation needs    Medical: Not on file    Non-medical: Not on file  Tobacco Use   Smoking status: Never Smoker   Smokeless tobacco: Never Used  Substance and Sexual Activity   Alcohol use: Yes    Alcohol/week: 14.0 standard drinks    Types: 14 Glasses of wine per week    Frequency: Never    Comment: last yesterday   Drug use: No   Sexual activity: Not on file  Lifestyle   Physical activity    Days per week: Not on file    Minutes per session: Not on file     Stress: Not on file  Relationships   Social connections    Talks on phone: Not on file    Gets together: Not on file    Attends religious service: Not on file    Active member of club or organization: Not on file    Attends meetings of clubs or organizations: Not on file    Relationship status: Not on file  Other Topics Concern   Not on file  Social History Narrative   Not on file     Review of Systems: A 12 point ROS discussed and pertinent positives are indicated in the HPI above.  All other systems are negative.  Review of Systems  Unable to perform ROS: Patient unresponsive    Vital Signs: BP 96/75    Pulse 66    Temp 97.6 F (36.4  C) (Oral)    Resp 16    Ht 5\' 8"  (1.727 m)    Wt 169 lb 15.6 oz (77.1 kg)    SpO2 98%    BMI 25.84 kg/m   Physical Exam Vitals signs and nursing note reviewed.  Constitutional:      Appearance: He is ill-appearing.  Cardiovascular:     Rate and Rhythm: Normal rate and regular rhythm.  Pulmonary:     Breath sounds: Rhonchi (bilaterally) present.  Abdominal:     General: There is no distension.     Palpations: Abdomen is soft.     Comments: (+) NG to suction (+) G tube  Skin:    General: Skin is warm and dry.      MD Evaluation Airway: (Unable to assess - patient does not follow commands) Heart: WNL Abdomen: WNL Chest/ Lungs: WNL ASA  Classification: 3 Mallampati/Airway Score: (Unable to assess - does not follow commands)   Imaging: Dg Chest 2 View  Result Date: 09/03/2019 CLINICAL DATA:  Fall. EXAM: CHEST - 2 VIEW COMPARISON:  Chest radiograph 08/04/2019 FINDINGS: Heart size within normal limits. No airspace consolidation. No evidence of pneumothorax or sizable pleural effusion. No acute displaced fracture is identified. Subacute/chronic fracture of the distal left clavicle. IMPRESSION: No airspace consolidation. Subacute/chronic fracture of the distal left clavicle. Electronically Signed   By: Kellie Simmering DO   On:  09/03/2019 13:32   Dg Abd 1 View  Result Date: 08/18/2019 CLINICAL DATA:  PEG tube placement, placed 08/17/2019 and 08/06/2019. Per ordering physician no contrast administered. EXAM: ABDOMEN - 1 VIEW COMPARISON:  Abdominal radiograph 08/08/2019 FINDINGS: PEG tube is again visualized overlying the left upper quadrant. Patency cannot be assessed in the absence of contrast. There are no dilated loops of bowel. No supine evidence for free air. Oral contrast is seen throughout multiple loops of bowel to the level of the rectum. Visualized lung bases are clear. No acute finding in the visualized skeleton. IMPRESSION: 1. PEG tube overlies the left upper quadrant. Patency cannot be assessed in the absence of contrast. 2. Nonobstructive bowel gas pattern. Oral contrast is noted throughout multiple loops of bowel. Electronically Signed   By: Audie Pinto M.D.   On: 08/18/2019 14:27   Dg Abd 1 View  Result Date: 08/08/2019 CLINICAL DATA:  Status post PEG placement. EXAM: ABDOMEN - 1 VIEW COMPARISON:  Single-view of the abdomen 07/29/2019. FINDINGS: Feeding tube is identified with its tip projecting over the left aspect of the L2 vertebral body. Contrast in the colon is noted. IMPRESSION: New feeding tube in place. Electronically Signed   By: Inge Rise M.D.   On: 08/08/2019 13:55   Ct Head Wo Contrast  Result Date: 09/03/2019 CLINICAL DATA:  Head injury after fall yesterday. EXAM: CT HEAD WITHOUT CONTRAST CT CERVICAL SPINE WITHOUT CONTRAST TECHNIQUE: Multidetector CT imaging of the head and cervical spine was performed following the standard protocol without intravenous contrast. Multiplanar CT image reconstructions of the cervical spine were also generated. COMPARISON:  August 14, 2019. FINDINGS: CT HEAD FINDINGS Brain: Mild diffuse cortical atrophy is noted. Mild chronic ischemic white matter disease is noted. No mass effect or midline shift is noted. Ventricular size is within normal limits. There  is no evidence of mass lesion, hemorrhage or acute infarction. Vascular: No hyperdense vessel or unexpected calcification. Skull: Normal. Negative for fracture or focal lesion. Sinuses/Orbits: No acute finding. Other: None. CT CERVICAL SPINE FINDINGS Alignment: Normal. Skull base and vertebrae: No acute fracture.  No primary bone lesion or focal pathologic process. Soft tissues and spinal canal: No prevertebral fluid or swelling. No visible canal hematoma. Disc levels: Moderate degenerative disc disease is noted at C5-6 with anterior posterior osteophyte formation. Upper chest: Negative. Other: None. IMPRESSION: 1. Mild diffuse cortical atrophy. Mild chronic ischemic white matter disease. No acute intracranial abnormality seen. 2. Moderate degenerative disc disease is noted at C5-6. No acute abnormality seen in the cervical spine. Electronically Signed   By: Marijo Conception M.D.   On: 09/03/2019 13:30   Ct Head Wo Contrast  Result Date: 08/14/2019 CLINICAL DATA:  Initial evaluation for acute trauma, fall. EXAM: CT HEAD WITHOUT CONTRAST TECHNIQUE: Contiguous axial images were obtained from the base of the skull through the vertex without intravenous contrast. COMPARISON:  Prior head CT from 07/17/2019. FINDINGS: Brain: Generalized age-related cerebral atrophy with mild chronic small vessel ischemic disease. No acute intracranial hemorrhage. No acute large vessel territory infarct. No mass lesion, midline shift or mass effect no hydrocephalus. No extra-axial fluid collection. Vascular: No hyperdense vessel. Scattered vascular calcifications noted within the carotid siphons. Skull: Scalp soft tissues demonstrate no acute finding. Calvarium intact. Sinuses/Orbits: Globes and orbital soft tissues within normal limits. Mild scattered mucosal thickening noted within the ethmoidal air cells and maxillary sinuses. No mastoid effusion. Other: None. IMPRESSION: 1. No acute intracranial abnormality. 2. Age-related cerebral  atrophy with mild chronic small vessel ischemic disease, stable. Electronically Signed   By: Jeannine Boga M.D.   On: 08/14/2019 22:41   Ct Chest Wo Contrast  Result Date: 09/03/2019 CLINICAL DATA:  Pneumothorax EXAM: CT CHEST WITHOUT CONTRAST TECHNIQUE: Multidetector CT imaging of the chest was performed following the standard protocol without IV contrast. COMPARISON:  CT 09/03/2019, chest x-ray 09/03/2019, CT chest 07/28/2019, 05/15/2019 FINDINGS: Cardiovascular: Some contrast present within the intravascular space from prior CT abdomen pelvis mild aortic atherosclerosis. No aneurysm. Coronary vascular calcification. Normal heart size. No pericardial effusion Mediastinum/Nodes: Midline trachea. No thyroid mass. Subcentimeter mediastinal lymph nodes. Diffuse fluid within the esophagus. Indistinct distal esophagus and GE junction with edema and tiny fluid. Small foci of gas around the distal esophagus with moderate retrocrural air. Lungs/Pleura: Moderate right hydropneumothorax. Mild bronchiectasis in the right upper lobe with patchy ground-glass density. Mild ground-glass density and nodularity in the right lower lobe. Similar findings in the left lower lobe but to a lesser degree. 3 mm pulmonary nodule in the peripheral left lung base. Upper Abdomen: No additional findings Musculoskeletal: No chest wall mass or suspicious bone lesions identified. IMPRESSION: 1. Moderate right hydropneumothorax. Indistinct appearance of the distal esophagus with thickening and multiple small foci of gas adjacent to the thickened distal esophagus and moderate retrocrural gas. Collective findings are suspicious for distal esophageal perforation. 2. Redemonstrated ground-glass density and nodularity in the right upper, right lower and left lower lobes with scattered pulmonary nodules, possibly due to chronic atypical infection, similar findings on 05/15/2019 CT. Critical Value/emergent results were called by telephone at  the time of interpretation on 09/03/2019 at 10:55 pm to providerCAMERON ISAACS , who verbally acknowledged these results. Aortic Atherosclerosis (ICD10-I70.0). Electronically Signed   By: Donavan Foil M.D.   On: 09/03/2019 22:55   Ct Cervical Spine Wo Contrast  Result Date: 09/03/2019 CLINICAL DATA:  Head injury after fall yesterday. EXAM: CT HEAD WITHOUT CONTRAST CT CERVICAL SPINE WITHOUT CONTRAST TECHNIQUE: Multidetector CT imaging of the head and cervical spine was performed following the standard protocol without intravenous contrast. Multiplanar CT image reconstructions of the cervical  spine were also generated. COMPARISON:  August 14, 2019. FINDINGS: CT HEAD FINDINGS Brain: Mild diffuse cortical atrophy is noted. Mild chronic ischemic white matter disease is noted. No mass effect or midline shift is noted. Ventricular size is within normal limits. There is no evidence of mass lesion, hemorrhage or acute infarction. Vascular: No hyperdense vessel or unexpected calcification. Skull: Normal. Negative for fracture or focal lesion. Sinuses/Orbits: No acute finding. Other: None. CT CERVICAL SPINE FINDINGS Alignment: Normal. Skull base and vertebrae: No acute fracture. No primary bone lesion or focal pathologic process. Soft tissues and spinal canal: No prevertebral fluid or swelling. No visible canal hematoma. Disc levels: Moderate degenerative disc disease is noted at C5-6 with anterior posterior osteophyte formation. Upper chest: Negative. Other: None. IMPRESSION: 1. Mild diffuse cortical atrophy. Mild chronic ischemic white matter disease. No acute intracranial abnormality seen. 2. Moderate degenerative disc disease is noted at C5-6. No acute abnormality seen in the cervical spine. Electronically Signed   By: Marijo Conception M.D.   On: 09/03/2019 13:30   Ct Abdomen Pelvis W Contrast  Result Date: 09/03/2019 CLINICAL DATA:  Sudden onset of right-sided chest and abdominal pain EXAM: CT ABDOMEN AND  PELVIS WITH CONTRAST TECHNIQUE: Multidetector CT imaging of the abdomen and pelvis was performed using the standard protocol following bolus administration of intravenous contrast. CONTRAST:  151mL OMNIPAQUE IOHEXOL 300 MG/ML  SOLN COMPARISON:  07/28/2019 CT, chest x-ray from earlier in the same day. FINDINGS: Lower chest: Lung bases demonstrate evidence of a small anterior right pneumothorax as well as large right-sided pleural effusion. These changes were not seen on a chest x-ray from earlier in the same day. Hepatobiliary: Diffuse decreased attenuation is noted consistent with fatty infiltration. The gallbladder is within normal limits. Pancreas: Unremarkable. No pancreatic ductal dilatation or surrounding inflammatory changes. Spleen: Normal in size without focal abnormality. Adrenals/Urinary Tract: Adrenal glands are unremarkable. Kidneys demonstrate a normal enhancement pattern bilaterally. No renal calculi or obstructive changes are seen. The bladder is partially distended. Stomach/Bowel: Prominent hemorrhoids are noted at the level of the anus. Scattered diverticular change of the colon is seen without evidence of diverticulitis. Gastrostomy catheter is seen in place. Changes of prior appendectomy are noted. No small bowel abnormality is seen. Vascular/Lymphatic: Aortic atherosclerosis. No enlarged abdominal or pelvic lymph nodes. Reproductive: Prostate is unremarkable. Other: No abdominal wall hernia or abnormality. No abdominopelvic ascites. Musculoskeletal: No acute or significant osseous findings. IMPRESSION: New right-sided pleural effusion and right anterior pneumothorax which were not present on a recent chest x-ray from approximately 9 hours ago. Although not mentioned in the body of the report there is some retrocrural air extending from adjacent to the distal esophagus. It is possible these changes as well as the pneumothorax could be related to esophageal injury. Correlation with any recent  vomiting is recommended. Fatty infiltration of the liver. Chronic changes as described above. Electronically Signed   By: Inez Catalina M.D.   On: 09/03/2019 22:37   Ir Gastrostomy Tube Mod Sed  Result Date: 08/17/2019 CLINICAL DATA:  Esophageal stricture, needs enteral feeding support. Previously placed gastrostomy catheter was inadvertently removed. EXAM: PERC PLACEMENT GASTROSTOMY FLUOROSCOPY TIME:  3.8 minutes; 77.9 mGy TECHNIQUE: The procedure, risks, benefits, and alternatives were explained to the patient. Questions regarding the procedure were encouraged and answered. The patient understands and consents to the procedure. As antibiotic prophylaxis, cefazolin 1 g was ordered pre-procedure and administered intravenously within one hour of incision. Under intermittent fluoroscopy, a 5 French angiographic catheter was  placed as orogastric tube. The upper abdomen was prepped with chlorhexidine, draped in usual sterile fashion, and infiltrated locally with 1% lidocaine. Intravenous Fentanyl 175mcg and Versed 2mg  were administered as conscious sedation during continuous monitoring of the patient's level of consciousness and physiological / cardiorespiratory status by the radiology RN, with a total moderate sedation time of 18 minutes. 0.5 mg glucagon given IV to facilitate gastric distention.Stomach was insufflated using air through the orogastric tube. Attempts were made to pass an angled 5 French angiographic catheter through the previous tract under fluoroscopy using small amount of contrast with these were ultimately unsuccessful. Under fluoroscopic guidance between the 2 previously placed gastropexy fasteners, an 18 gauge entry needle was advanced percutaneously into the gastric lumen under fluoroscopy. Gas could be aspirated and a small contrast injection confirmed intraluminal spread. Guidewire advanced easily, within the gastric lumen on fluoroscopy. Needle was exchanged over a guidewire for serial  vascular dilators which allowed placement of a 18 French peel-away sheath, through which a 50 French balloon retention single-lumen gastrostomy catheter was advanced. The peel-away sheath was removed. The retention balloon was inflated with 7 mL sterile saline. Contrast injection confirms good positioning within the lumen of the stomach. The external bumper was applied and the catheter was flushed and capped. COMPLICATIONS: COMPLICATIONS none IMPRESSION: 1. Technically successful 16 French balloon-retention gastrostomy placement under fluoroscopy. Electronically Signed   By: Lucrezia Europe M.D.   On: 08/17/2019 12:59   Ir Gastrostomy Tube Mod Sed  Result Date: 08/06/2019 CLINICAL DATA:  Esophageal stricture and need for gastrostomy tube for nutrition. EXAM: PERCUTANEOUS GASTROSTOMY TUBE PLACEMENT ANESTHESIA/SEDATION: 3.0 mg IV Versed; 100 mcg IV Fentanyl. Total Moderate Sedation Time 30 minutes. The patient's level of consciousness and physiologic status were continuously monitored during the procedure by Radiology nursing. CONTRAST:  51mL VISIPAQUE IODIXANOL 320 MG/ML IV SOLN MEDICATIONS: 2 g IV Ancef. IV antibiotic was administered in an appropriate time interval prior to needle puncture of the skin. FLUOROSCOPY TIME:  5 minutes and 18 seconds.  100.5 mGy. PROCEDURE: The procedure, risks, benefits, and alternatives were explained to the patient. Questions regarding the procedure were encouraged and answered. The patient understands and consents to the procedure. A 5-French catheter was then advanced through the patient's mouth under fluoroscopy into the esophagus and to the level of the stomach. This catheter was used to insufflate the stomach with air under fluoroscopy. The abdominal wall was prepped with chlorhexidine in a sterile fashion, and a sterile drape was applied covering the operative field. A sterile gown and sterile gloves were used for the procedure. Local anesthesia was provided with 1% Lidocaine.  A skin incision was made in the upper abdominal wall. Under fluoroscopy, an 18 gauge trocar needle was advanced into the stomach. Contrast injection was performed to confirm intraluminal position of the needle tip. A single T tack was then deployed in the lumen of the stomach. This was brought up to tension at the skin surface. A second T tack was then deployed in the lumen of the stomach utilizing similar technique approximately 3 cm from the first T tack. A 19 gauge needle was used to puncture the stomach under fluoroscopy between the 2 T tacks. Intraluminal positioning was confirmed by contrast injection. A guidewire was then advanced through the needle into the gastric lumen and the needle removed. The percutaneous tract was dilated over the wire. A peel-away sheath was placed. A 16 French balloon retention gastrostomy tube was then advanced through the peel-away sheath into the  gastric lumen. The peel-away sheath was removed. The retention balloon of the gastrostomy tube was inflated with 6 mL of saline. The catheter was injected with contrast material to confirm position and a fluoroscopic spot image saved. The tube was then flushed with saline. A dressing was applied over the gastrostomy exit site. COMPLICATIONS: None. FINDINGS: The stomach distended well with air allowing safe placement of the gastrostomy tube. After placement, the tip of the gastrostomy tube lies in the body of the stomach. IMPRESSION: Percutaneous gastrostomy with placement of a 88 French balloon retention tube in the body of the stomach. This tube can be used for percutaneous feeds beginning in 24 hours after placement. Electronically Signed   By: Aletta Edouard M.D.   On: 08/06/2019 17:26   Dg Chest Port 1 View  Result Date: 09/05/2019 CLINICAL DATA:  Esophageal rupture. EXAM: PORTABLE CHEST 1 VIEW COMPARISON:  September 04, 2019. FINDINGS: Stable cardiomediastinal silhouette. Nasogastric tube appears to have been retracted with  distal tip in expected position of mid esophagus. Two right-sided chest tubes are noted without pneumothorax. Right internal jugular catheter is unchanged. Endotracheal tube is unchanged in position. Mild bibasilar subsegmental atelectasis is noted. Minimally displaced left seventh rib fracture is noted of indeterminate age. IMPRESSION: Two right-sided chest tubes are noted without pneumothorax. Mild bibasilar subsegmental atelectasis is noted. Nasogastric tube appears to have been retracted with distal tip in expected position of mid esophagus. Minimally displaced left seventh rib fracture is noted of indeterminate age. Electronically Signed   By: Marijo Conception M.D.   On: 09/05/2019 09:50   Dg Chest Port 1 View  Result Date: 09/04/2019 CLINICAL DATA:  Endotracheal tube. EXAM: PORTABLE CHEST 1 VIEW COMPARISON:  September 03, 2019. FINDINGS: The heart size and mediastinal contours are within normal limits. Endotracheal tube is in grossly good position. Distal tip of nasogastric tube is seen in distal esophagus. Two right-sided chest tubes are noted. No definite pneumothorax is noted. Right internal jugular catheter is noted with distal tip in expected position of the SVC. Mild bibasilar subsegmental atelectasis is noted. The visualized skeletal structures are unremarkable. IMPRESSION: Two right-sided chest tubes are noted without definite pneumothorax. Distal tip of nasogastric tube is seen in distal esophagus and advancement is recommended. Mild bibasilar subsegmental atelectasis is noted. Endotracheal tube is in grossly good position. Electronically Signed   By: Marijo Conception M.D.   On: 09/04/2019 08:37   Ct Maxillofacial Wo Contrast  Result Date: 08/14/2019 CLINICAL DATA:  Darrick Grinder evaluation for acute trauma, fall. EXAM: CT MAXILLOFACIAL WITHOUT CONTRAST TECHNIQUE: Multidetector CT imaging of the maxillofacial structures was performed. Multiplanar CT image reconstructions were also generated.  COMPARISON:  None. FINDINGS: Osseous: Zygomatic arches intact. No acute maxillary fracture. Pterygoid plates intact. Nasal bones intact without fracture. Nasal septum intact. No acute mandibular fracture. Mandibular condyles normally situated. Dentition grossly intact. Orbits: Globes and orbital soft tissues within normal limits. Bony orbits intact. Sinuses: Mild mucoperiosteal thickening present within the ethmoidal air cells and right greater than left maxillary sinuses. Paranasal sinuses are otherwise clear. No hemosinus. Mastoid air cells and middle ear cavities are well pneumatized and free of fluid. Soft tissues: Soft tissue laceration present just to the left of midline at the upper lip. Superimposed radiopaque density could reflect debris/foreign body (series 3, image 59). Again, adjacent dentition grossly intact. No other visible soft tissue injury about the face. Limited intracranial: Unremarkable. IMPRESSION: 1. Soft tissue laceration just to the left of midline at the upper lip.  Superimposed radiopaque density could reflect debris/foreign body. Adjacent dentition grossly intact. 2. No other acute maxillofacial injury identified.  No fracture. Electronically Signed   By: Jeannine Boga M.D.   On: 08/14/2019 22:55   US Abdomen Limited Ruq  Result Date: 09/03/2019 CLINICAL DATA:  Right upper quadrant pain EXAM: ULTRASOUND ABDOMEN LIMITED RIGHT UPPER QUADRANT COMPARISON:  08/18/2019 radiograph, ultrasound 07/28/2019 FINDINGS: Gallbladder: No gallstones or wall thickening visualized. No sonographic Murphy sign noted by sonographer. Common bile duct: Diameter: 3 mm Liver: Increased hepatic echogenicity without focal hepatic abnormality. Portal vein is patent on color Doppler imaging with normal direction of blood flow towards the liver. Other: Possible small right pleural effusion IMPRESSION: 1. Negative for gallstones or biliary dilatation 2. Echogenic liver consistent with steatosis and or  hepatocellular disease 3. Possible right pleural effusion Electronically Signed   By: Donavan Foil M.D.   On: 09/03/2019 21:40    Labs:  CBC: Recent Labs    09/03/19 1112 09/03/19 2028  09/04/19 0522 09/04/19 0537 09/04/19 1241 09/05/19 0300  WBC 12.4* 31.5*  --   --  11.6*  --  7.8  HGB 12.5* 13.4   < > 10.2* 9.3* 10.2* 8.7*  HCT 39.2 42.4   < > 30.0* 31.1* 30.0* 28.3*  PLT 488* 569*  --   --  257  --  204   < > = values in this interval not displayed.    COAGS: Recent Labs    07/28/19 1920 08/03/19 0539 08/06/19 0503 08/17/19 0627  INR 1.3* 1.2 1.2 1.2  APTT 132*  --   --  34    BMP: Recent Labs    09/03/19 1112 09/03/19 2028  09/04/19 0522 09/04/19 0537 09/04/19 1241 09/05/19 0300  NA 137 136   < > 139 139 137 136  K 3.9 4.1   < > 4.2 4.2 4.0 2.8*  CL 98 93*  --   --  105  --  104  CO2 20* 12*  --   --  20*  --  27  GLUCOSE 109* 183*  --   --  134*  --  154*  BUN 11 14  --   --  9  --  6*  CALCIUM 8.8* 9.3  --   --  7.6*  --  8.0*  CREATININE 0.45* 0.98  --   --  0.51*  --  0.42*  GFRNONAA >60 >60  --   --  >60  --  >60  GFRAA >60 >60  --   --  >60  --  >60   < > = values in this interval not displayed.    LIVER FUNCTION TESTS: Recent Labs    08/16/19 0006 09/03/19 1112 09/03/19 2028 09/05/19 0300  BILITOT 1.3* 0.7 1.2 1.2  AST 21 43* 62* 22  ALT 17 36 37 19  ALKPHOS 92 129* 139* 65  PROT 5.9* 7.1 7.3 4.5*  ALBUMIN 2.8* 3.4* 3.6 2.2*    TUMOR MARKERS: No results for input(s): AFPTM, CEA, CA199, CHROMGRNA in the last 8760 hours.  Assessment and Plan:  63 y/o M with history of esophageal perforation s/p right thoracotomy, mediastinal debridement and primary repair of esophageal perforation with intercostal muscle flap. He previously had a G tube placed in IR on 08/06/19 which was replaced on 08/17/19 after the patient inadvertently pulled the existing tube out. IR has been asked to convert the G tube to a GJ tube in order to initiate tube  feeding.  Plan for G to [place] conversion today in IR - will not likely need sedation given current use of Precedex due to agitation. Tmax 98.4, hypotensive, WBC 7.8, hgb 8.7, plt 204.  Risks and benefits discussed with the patient's sister Patty including, but not limited to the need for a barium enema during the procedure, bleeding, infection, peritonitis, or damage to adjacent structures.  All of the patient's sister's questions were answered and she is agreeable to proceed.  Consent signed and in IR control room.   Thank you for this interesting consult.  I greatly enjoyed meeting Bilal A Kahron Kauth. and look forward to participating in their care.  A copy of this report was sent to the requesting provider on this date.  Electronically Signed: Joaquim Nam, PA-C 09/05/2019, 1:41 PM   I spent a total of 20 Minutes in face to face in clinical consultation, greater than 50% of which was counseling/coordinating care for G to [place] conversion.

## 2019-09-05 NOTE — Progress Notes (Signed)
[name]., male   DOB: 05/14/1956, 63 y.o.   MRN: [id number] TCTS Evening Rounds:  Hemodynamically stable in sinus rhythm  sats 96% on RA  Urine output ok  CT output low

## 2019-09-06 ENCOUNTER — Inpatient Hospital Stay (HOSPITAL_COMMUNITY): Payer: Self-pay

## 2019-09-06 LAB — COMPREHENSIVE METABOLIC PANEL
ALT: 17 U/L (ref 0–44)
AST: 18 U/L (ref 15–41)
Albumin: 1.9 g/dL — ABNORMAL LOW (ref 3.5–5.0)
Alkaline Phosphatase: 58 U/L (ref 38–126)
Anion gap: 6 (ref 5–15)
BUN: <5 mg/dL — ABNORMAL LOW (ref 8–23)
CO2: 25 mmol/L (ref 22–32)
Calcium: 8.1 mg/dL — ABNORMAL LOW (ref 8.9–10.3)
Chloride: 107 mmol/L (ref 98–111)
Creatinine, Ser: 0.35 mg/dL — ABNORMAL LOW (ref 0.61–1.24)
GFR calc Af Amer: >60 mL/min (ref >60)
GFR calc non Af Amer: >60 mL/min (ref >60)
Glucose, Bld: 118 mg/dL — ABNORMAL HIGH (ref 70–99)
Potassium: 3.6 mmol/L (ref 3.5–5.1)
Sodium: 138 mmol/L (ref 135–145)
Total Bilirubin: 0.8 mg/dL (ref 0.3–1.2)
Total Protein: 4.3 g/dL — ABNORMAL LOW (ref 6.5–8.1)

## 2019-09-06 LAB — CBC
HCT: 28.8 % — ABNORMAL LOW (ref 39.0–52.0)
Hemoglobin: 8.9 g/dL — ABNORMAL LOW (ref 13.0–17.0)
MCH: 26.6 pg (ref 26.0–34.0)
MCHC: 30.9 g/dL (ref 30.0–36.0)
MCV: 86.2 fL (ref 80.0–100.0)
Platelets: 201 10*3/uL (ref 150–400)
RBC: 3.34 MIL/uL — ABNORMAL LOW (ref 4.22–5.81)
RDW: 20.2 % — ABNORMAL HIGH (ref 11.5–15.5)
WBC: 7.2 10*3/uL (ref 4.0–10.5)
nRBC: 0 % (ref 0.0–0.2)

## 2019-09-06 LAB — GLUCOSE, CAPILLARY
Glucose-Capillary: 125 mg/dL — ABNORMAL HIGH (ref 70–99)
Glucose-Capillary: 99 mg/dL (ref 70–99)
Glucose-Capillary: 105 mg/dL — ABNORMAL HIGH (ref 70–99)
Glucose-Capillary: 84 mg/dL (ref 70–99)
Glucose-Capillary: 123 mg/dL — ABNORMAL HIGH (ref 70–99)
Glucose-Capillary: 92 mg/dL (ref 70–99)

## 2019-09-06 LAB — MAGNESIUM
Magnesium: 2 mg/dL (ref 1.7–2.4)
Magnesium: 1.8 mg/dL (ref 1.7–2.4)

## 2019-09-06 LAB — PHOSPHORUS: Phosphorus: 2 mg/dL — ABNORMAL LOW (ref 2.5–4.6)

## 2019-09-06 MED ORDER — VITAL HIGH PROTEIN PO LIQD
1000.0000 mL | ORAL | Status: DC
  Administered 2019-09-06 – 2019-09-07 (×2): 1000 mL

## 2019-09-06 MED ORDER — PRO-STAT SUGAR FREE PO LIQD
30.0000 mL | Freq: Two times a day (BID) | ORAL | Status: DC
  Administered 2019-09-06 – 2019-10-17 (×74): 30 mL
  Filled 2019-09-06 (×73): qty 30

## 2019-09-06 MED ORDER — POTASSIUM CHLORIDE 10 MEQ/50ML IV SOLN
10.0000 meq | INTRAVENOUS | Status: AC
  Administered 2019-09-06 (×3): 10 meq via INTRAVENOUS
  Filled 2019-09-06 (×3): qty 50

## 2019-09-06 NOTE — Progress Notes (Signed)
      [address]       [place] [address]             [phone]                 3 Days Post-Op Procedure(s) (LRB): THORACOTOMY MAJOR FOR ESOPHAGEAL PERFORATION WITH INTERCOSTAL MUSCLE FLAP (Right)   Events: No events.  G tube converted to [place] _______________________________________________________________ Vitals: BP 105/85   Pulse 84   Temp 97.6 F (36.4 C) (Oral)   Resp 12   Ht 5\' 8"  (1.727 m)   Wt 69.8 kg   SpO2 95%   BMI 23.40 kg/m   - Neuro: arousable  - Cardiovascular: sinus  Drips: none.      - Pulm: EWOB, minimal CT output    ABG    Component Value Date/Time   PHART 7.426 09/04/2019 1241   PCO2ART 40.0 09/04/2019 1241   PO2ART 111.0 (H) 09/04/2019 1241   HCO3 26.5 09/04/2019 1241   TCO2 28 09/04/2019 1241   ACIDBASEDEF 6.0 (H) 09/04/2019 0522   O2SAT 99.0 09/04/2019 1241    - Abd: soft, NT   .Intake/Output      11/25 0701 - 11/26 0700 11/26 0701 - 11/27 0700   P.O. 0    I.V. (mL/kg) 2544.9 (36.5) 408.5 (5.9)   Other 120    IV Piggyback 1110.9 96.6   Total Intake(mL/kg) 3775.8 (54.1) 505 (7.2)   Urine (mL/kg/hr) 3435 (2.1) 450 (1.5)   Emesis/NG output 20    Drains 415    Chest Tube 410 40   Total Output 4280 490   Net -504.2 +15           _______________________________________________________________ Labs: CBC Latest Ref Rng & Units 09/06/2019 09/05/2019 09/04/2019  WBC 4.0 - 10.5 K/uL 7.2 7.8 -  Hemoglobin 13.0 - 17.0 g/dL 8.9(L) 8.7(L) 10.2(L)  Hematocrit 39.0 - 52.0 % 28.8(L) 28.3(L) 30.0(L)  Platelets 150 - 400 K/uL 201 204 -   CMP Latest Ref Rng & Units 09/06/2019 09/05/2019 09/05/2019  Glucose 70 - 99 mg/dL 118(H) 138(H) 154(H)  BUN 8 - 23 mg/dL <5(L) <5(L) 6(L)  Creatinine 0.61 - 1.24 mg/dL 0.35(L) 0.43(L) 0.42(L)  Sodium 135 - 145 mmol/L 138 136 136  Potassium 3.5 - 5.1 mmol/L 3.6 3.3(L) 2.8(L)  Chloride 98 - 111 mmol/L 107 106 104  CO2 22 - 32 mmol/L 25 26 27   Calcium 8.9 - 10.3 mg/dL 8.1(L) 7.9(L)  8.0(L)  Total Protein 6.5 - 8.1 g/dL 4.3(L) - 4.5(L)  Total Bilirubin 0.3 - 1.2 mg/dL 0.8 - 1.2  Alkaline Phos 38 - 126 U/L 58 - 65  AST 15 - 41 U/L 18 - 22  ALT 0 - 44 U/L 17 - 19    CXR:   _______________________________________________________________  Assessment and Plan: POD 2 s/p esophageal perforation repair Doing well Will start tube feeds today Will remove aline Continue ICU care for now, but likely 2C soon  Melodie Bouillon, MD 09/06/2019 11:10 AM

## 2019-09-06 NOTE — Anesthesia Postprocedure Evaluation (Signed)
Anesthesia Post Note  Patient: George Wade.  Procedure(s) Performed: THORACOTOMY MAJOR FOR ESOPHAGEAL PERFORATION WITH INTERCOSTAL MUSCLE FLAP (Right Chest)     Patient location during evaluation: SICU Anesthesia Type: General Level of consciousness: sedated Pain management: pain level controlled Vital Signs Assessment: post-procedure vital signs reviewed and stable Respiratory status: patient remains intubated per anesthesia plan Cardiovascular status: stable Postop Assessment: no apparent nausea or vomiting Anesthetic complications: no    Last Vitals:  Vitals:   09/06/19 1900 09/06/19 1910  BP:  (!) 87/63  Pulse: (!) 116 (!) 118  Resp: 13 14  Temp:    SpO2: 91% 93%    Last Pain:  Vitals:   09/06/19 1800  TempSrc: Oral  PainSc:                  Catalina Gravel

## 2019-09-07 LAB — MAGNESIUM
Magnesium: 1.8 mg/dL (ref 1.7–2.4)
Magnesium: 2.4 mg/dL (ref 1.7–2.4)

## 2019-09-07 LAB — GLUCOSE, CAPILLARY
Glucose-Capillary: 102 mg/dL — ABNORMAL HIGH (ref 70–99)
Glucose-Capillary: 107 mg/dL — ABNORMAL HIGH (ref 70–99)
Glucose-Capillary: 124 mg/dL — ABNORMAL HIGH (ref 70–99)
Glucose-Capillary: 124 mg/dL — ABNORMAL HIGH (ref 70–99)
Glucose-Capillary: 130 mg/dL — ABNORMAL HIGH (ref 70–99)
Glucose-Capillary: 137 mg/dL — ABNORMAL HIGH (ref 70–99)
Glucose-Capillary: 159 mg/dL — ABNORMAL HIGH (ref 70–99)
Glucose-Capillary: 94 mg/dL (ref 70–99)
Glucose-Capillary: 95 mg/dL (ref 70–99)

## 2019-09-07 LAB — PHOSPHORUS
Phosphorus: 2.4 mg/dL — ABNORMAL LOW (ref 2.5–4.6)
Phosphorus: 1.9 mg/dL — ABNORMAL LOW (ref 2.5–4.6)

## 2019-09-07 MED ORDER — OSMOLITE 1.5 CAL PO LIQD: 1000.0000 mL | ORAL | Status: DC

## 2019-09-07 MED ORDER — MAGNESIUM SULFATE 2 GM/50ML IV SOLN
2.0000 g | Freq: Once | INTRAVENOUS | Status: AC
  Administered 2019-09-07: 2 g via INTRAVENOUS
  Filled 2019-09-07: qty 50

## 2019-09-07 MED ORDER — OSMOLITE 1.5 CAL PO LIQD
1000.0000 mL | ORAL | Status: DC
  Administered 2019-09-07 – 2019-09-24 (×18): 1000 mL
  Filled 2019-09-07 (×32): qty 1000

## 2019-09-07 MED ORDER — WHITE PETROLATUM EX OINT
TOPICAL_OINTMENT | CUTANEOUS | Status: AC
  Filled 2019-09-07: qty 28.35

## 2019-09-07 MED ORDER — FLUCONAZOLE IN SODIUM CHLORIDE 400-0.9 MG/200ML-% IV SOLN
400.0000 mg | INTRAVENOUS | Status: DC
  Administered 2019-09-07 – 2019-09-20 (×14): 400 mg via INTRAVENOUS
  Filled 2019-09-07 (×15): qty 200

## 2019-09-07 NOTE — Progress Notes (Signed)
4 Days Post-Op Procedure(s) (LRB): THORACOTOMY MAJOR FOR ESOPHAGEAL PERFORATION WITH INTERCOSTAL MUSCLE FLAP (Right) Subjective: C/o incisional pain  Objective: Vital signs in last 24 hours: Temp:  [97.5 F (36.4 C)-98.4 F (36.9 C)] 97.9 F (36.6 C) (11/27 0700) Pulse Rate:  [63-130] 78 (11/27 0600) Cardiac Rhythm: Normal sinus rhythm (11/27 0400) Resp:  [10-25] 18 (11/27 0600) BP: (87-132)/(63-114) 101/78 (11/27 0600) SpO2:  [91 %-98 %] 96 % (11/27 0600) Arterial Line BP: (87-102)/(65-81) 95/65 (11/26 1200) Weight:  [70.5 kg] 70.5 kg (11/27 0500)  Hemodynamic parameters for last 24 hours:    Intake/Output from previous day: 11/26 0701 - 11/27 0700 In: 3061.1 [I.V.:2043.4; NG/GT:357.8; IV Piggyback:599.9] Out: 3005 [Urine:2570; Drains:235; Chest Tube:200] Intake/Output this shift: No intake/output data recorded.  General appearance: alert and mild distress Neurologic: intact Heart: regular rate and rhythm Lungs: diminished breath sounds bibasilar Abdomen: normal findings: soft, non-tender Wound: clean and dry  Lab Results: Recent Labs    09/05/19 0300 09/06/19 0213  WBC 7.8 7.2  HGB 8.7* 8.9*  HCT 28.3* 28.8*  PLT 204 201   BMET:  Recent Labs    09/05/19 1556 09/06/19 0213  NA 136 138  K 3.3* 3.6  CL 106 107  CO2 26 25  GLUCOSE 138* 118*  BUN <5* <5*  CREATININE 0.43* 0.35*  CALCIUM 7.9* 8.1*    PT/INR: No results for input(s): LABPROT, INR in the last 72 hours. ABG    Component Value Date/Time   PHART 7.426 09/04/2019 1241   HCO3 26.5 09/04/2019 1241   TCO2 28 09/04/2019 1241   ACIDBASEDEF 6.0 (H) 09/04/2019 0522   O2SAT 99.0 09/04/2019 1241   CBG (last 3)  Recent Labs    09/07/19 0017 09/07/19 0406 09/07/19 0748  GLUCAP 124* 130* 124*    Assessment/Plan: S/P Procedure(s) (LRB): THORACOTOMY MAJOR FOR ESOPHAGEAL PERFORATION WITH INTERCOSTAL MUSCLE FLAP (Right) -NEURO- alert and appropriate this AM  On standing lorazepam IV  CV-  stable  RESP- CXR clear, sats Ok on RA  RENAL- no new labs this AM, check BMET in AM  ENDO- CBG well controlled  GI- TF started via J, G to straight drainage. NG in mid esophagus  Continue TF, continue NPO  ID- on vanco and zosyn. Afebrile and normal WBC- continue for 7 days  SCD + enoxaparin for DVT prophylaxis  LOS: 4 days    George Wade 09/07/2019

## 2019-09-07 NOTE — Discharge Summary (Signed)
[address]       [place] [address]             [phone]      Physician Discharge Summary  [name]. MRN: [id number] DOB/AGE: 05/25/1956 63 y.o.  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   Esophageal perforation 09/04/2019   Anal condylomata    Rectal bleeding 08/20/2019   Rectal mass 08/20/2019   PEG (percutaneous endoscopic gastrostomy) status ([place])    Aspiration into airway    Fall 08/15/2019   Elevated LFTs    Esophageal stricture    Problems with swallowing and mastication    Stricture and stenosis of esophagus    Malnutrition of moderate degree 07/31/2019   Nausea & vomiting    Pulmonary emboli ([facility]) 07/28/2019   Alcohol-induced mood disorder ([place]) 07/28/2019   Alcohol dependence with uncomplicated intoxication ([place])    Acute respiratory failure with hypoxia ([place]) 05/15/2019   Wernicke's encephalopathy 04/15/2019   GI bleed 03/31/2019   Alcohol withdrawal ([place]) 12/09/2017   Discharge Diagnoses:   Boerhaave's syndrome Esophageal perforation Esophageal stricture Malnutrition Alcohol dependence with acute withdrawal Peri-anal condyloma Pulmonary embolus   Discharged Condition: good  HPI:   63 yo man with history of ethanol abuse, non-Hodgkins lymphoma, tonsillar cancer, hypertension, hypercholesterolemia, PE and esophageal stricture. Presented to [facility] ED earlier with cc/o CP right sided, came on suddenly. Had been in ED last night with similar complaints and alcohol intoxication.  CT at [facility] showed right hydropneumothorax and extensive distal pneumomediastinum. WBC 31K, lactic acid 10. Started on IV antibiotics and fluid resuscitated.  Had esophageal dilatation about a month prior to admission for benign stricture. Had a PEG tube placed after that.  Hospital  Course:   On 09/04/2019 George Wade underwent a right thoracotomy, mediastinal debridement, and primary repair of esophageal perforation with an intercostal muscle flap by Dr. [name].  He tolerated the procedure well and was transferred to the surgical ICU.  He was extubated routinely in a timely manner.  Postop day 1 he was alert and cooperative with no alcohol withdrawal symptoms.  Postop day 2 he was intermittently confused and trying to get out of bed.  Precedex had to be restarted the previous night but was held due to low blood pressure.  The patient remained in normal sinus rhythm status post repair of esophageal rupture.  His oxygenation saturation remained adequate on 2 L nasal cannula.  He remained afebrile and his white blood cell count was normal on vancomycin and Zosyn.  He remained n.p.o. and we consulted interventional radiology to convert his PEG to a PEG J to start tube feedings.  Postop day 3 he continued to do well.  We started his tube feedings and this was well tolerated.   Removed his arterial line.  We continued ICU care.  Postop day 4 his only complaint was incisional pain.  He was continued on  lorazepam IV as required until his withdrawal symptome subsided.  He was converted to oral Seroquel with good response. He remained clam and cooperative for the remainder of the admission.   He remained n.p.o. on tube feedings.  We continued his antibiotics for an intended course of 7 days.  He remained on Lovenox for DVT prophylaxis and later was transitioned to oral Eliquis for treatment of his pulmonary embolus.  On the seventh  postoperative day, a Gastrografin swallow was obtained showed no evidence of esophageal leak.  There was a significant esophageal stricture distally as expected.  Study was repeated 1 week later a similar result. We requested evaluation by the gastroenterology service.  They recommended EGD with dilatation.  This was carried out on 4 occasions, each about 1 week  apart.  He was initially dilated to 11 mm and progressed to 13.5 mm by the last dilation on 10/16/2019. This resulted in his being able to advance his diet to puree and eventually mechanical soft which he tolerated intermittently.   For this reason, his tube feeding was continued at night while he was allowed to take PO's as tolerate during the day.  As his discharge approached, we transitioned from tube feeds utilizing a continuous pump to intermittent bolus tube feeding which he could accomplish on his own as he was doing prior to admission. The GI service recommended esophageal stenting and this was accomplished on 10/30/19. Following the procedure, George Wade was able to slowly advance his diet and tolerate small amounts of solid food. We continued to supplement his diet with Osmolite per the feeding tube During the course of the admission, patient was noted to have a large perirectal condyloma that created continuous discomfort and also resulted in frequent incontinence of stool as well as bleeding. We monitored his hematocrit which remained stable in a range of 27-30%.   We consulted the general surgery team at [facility] and after evaluation, they determined this lesion was too large  to be managed safely by their team and referral to a tertiary care facility was recommended. A referral and appointment were made with Dr. Shela Leff, colo-rectal surgeon at [facility] Department of Surgery.  George Wade was being treated for a DVT and pulmonary emboli prior to admission.  We resumed anticoagulation with Lovenox as soon as he was beyond the risk of post surgical bleeding. He was later converted back to Eliquis as he was taking prior to admission. This should be continued for 6 months.  George Wade is currently homeless, unemployed, and without insurance. He currently has no family members involved in his life.  The care management team as well as the chaplain services have been assisting as they are  able to arrange for temporary housing and supplies for supplemental tube feeding. He will be discharged with a rolling walker and arrangements have also been made for home health nursing visits as well.     Consults: Gastroenterology  Significant Diagnostic Studies:  ESOPHOGRAM/BARIUM SWALLOW  TECHNIQUE: Single contrast examination was performed using  water-soluble.  FLUOROSCOPY TIME:  Fluoroscopy Time:  0 minutes and 48 seconds.  Radiation Exposure Index (if provided by the fluoroscopic device): 75.3 mGy  Number of Acquired Spot Images:  COMPARISON:  09/20/2019.  FINDINGS: Similar to prior exam, there is a tight stricture in the distal third of the esophagus through which only a trace amount of contrast passes after repeated swallows. Patient was given a total of approximately 20 cc water-soluble contrast material. Overall imaging appearance is very similar to the study from 09/20/2019. No contrast extravasation to suggest leak.  IMPRESSION: Persistent tight distal esophageal stricture through which only a thin string of contrast passes with each swallow. Appearance is very similar to the prior study. No evidence for contrast extravasation to suggest leak.   Electronically Signed   By: Misty Stanley M.D.   On: 09/25/2019 12:09   EXAM: CHEST - 2 VIEW  COMPARISON:  10/04/2019.  09/28/2019.  FINDINGS: Right PICC line in stable position. Mediastinum and hilar structures normal. Heart size stable. Slight progression of atelectatic changes right lung base. No prominent pleural effusion. No pneumothorax. Improved subcutaneous emphysema right neck and chest wall. Carotid vascular calcification. Postsurgical changes left upper quadrant.  IMPRESSION: 1.  Right PICC line stable position.  2.  Slight progression of right base subsegmental atelectasis.  3. Interim improvement of subcutaneous emphysema in the right neck and chest wall. No  pneumothorax.   Electronically Signed   By: Marcello Moores  Register   On: 10/18/2019 06:38  Treatments:  OPERATIVE REPORT  DATE OF PROCEDURE:  09/04/2019  PREOPERATIVE DIAGNOSIS:  Boerhaave's syndrome with esophageal rupture.  POSTOPERATIVE DIAGNOSIS:  Boerhaave's syndrome with esophageal rupture.  PROCEDURES:   1.  Right thoracotomy.  2.  Primary repair of esophageal perforation with intercostal muscle flap reinforcement.  SURGEON:  Modesto Charon, MD  ASSISTANT:  Enid Cutter, PA  ANESTHESIA:  General.  FINDINGS:  Approximately 2.5 L of murky fluid in the right pleural space.  Minimal necrotic tissue.  Rupture of the esophagus.  Unable to pass NG beyond the midesophagus.  CLINICAL NOTE:  The patient is a 63 year old man with a history of ethanol abuse and esophageal stricture who presented with right-sided chest pain.  Workup showed a right hydropneumothorax with extensive distal pneumomediastinum.  His white blood cell  count was 31,000 and lactic acid level was 10.  He was started on IV antibiotics and fluid resuscitated and transferred to [facility] for further management.  He was taken to the operating room.  He was advised to undergo right thoracotomy for management  of esophageal rupture.  The indications, risks, benefits and alternatives were discussed in detail with the patient.  He understood and accepted the risks and agreed to proceed.    10/16/2019 EGD was performed for treatment of esophageal stenosis.  A tight stenosis was noted at 33 cm from incisors, adult gastroscope could not be advanced beyond the stenosis. Balloon dilation was performed with 10 mm, 11 mm and 12 mm balloons, each for 1 consecutive minute. Thereafter the scope could be advanced into the gastric cavity. A linear plastic like material noted at the area of stenosis, possibly suture. 3 cm hiatal hernia noted. Evidence of intact gastrostomy, normal appearance of underlying  mucosa, tip of the feeding tube was noted in the duodenum. Retroflexion was unremarkable. Rest of the gastric cavity, antrum, duodenum and duodenal bulb were normal in appearance. At the end of the procedure, esophageal stenotic area was dilated with 13.5 mm balloon. The tip of the balloon catheter cause mucosal trauma in the proximal gastric body which was treated with placement of 1 Endo Clip.  The patient tolerated the procedure well.   Recommendation: Start mechanical soft diet. PPI twice daily and sucralfate 4 times daily for the next 2 months. Will need repeat EGD for dilatation as an outpatient in the next 2 to 4 weeks.  I have reached out to the surgical PA at [facility], as patient has a large fungating anal mass which needs excision.  [name], MD   EGD and Esophageal Stent Procedure [facility] Patient Name: Almus Woodham Procedure Date : 10/30/2019 MRN: [id number] Attending MD: Clarene Essex , MD Date of Birth: Oct 29, 1955 CSN: [id number] Age: 89 Admit Type: Inpatient Procedure:                Upper GI endoscopy Indications:  Dysphagia, difficult to dilate stricture status                            post Boerhaave's and surgery Providers:                Clarene Essex, MD, Benay Pillow, RN, Vista Lawman,                            RN, Elspeth Cho Tech., Technician, Claybon Jabs                            CRNA, CRNA Referring MD:              Medicines:                Propofol total dose 076 mg IV Complications:            No immediate complications. Estimated Blood Loss:     Estimated blood loss: none. Procedure:                Pre-Anesthesia Assessment:                           - Prior to the procedure, a History and Physical                            was performed, and patient medications and                            allergies were reviewed. The patient's tolerance of                            previous anesthesia was also  reviewed. The risks                            and benefits of the procedure and the sedation                            options and risks were discussed with the patient.                            All questions were answered, and informed consent                            was obtained. Prior Anticoagulants: The patient has                            taken Eliquis (apixaban), last dose was 2 days                            prior to procedure. ASA Grade Assessment: II - A                            patient with mild systemic disease. After reviewing  the risks and benefits, the patient was deemed in                            satisfactory condition to undergo the procedure.                           After obtaining informed consent, the endoscope was                            passed under direct vision. Throughout the                            procedure, the patient's blood pressure, pulse, and                            oxygen saturations were monitored continuously. The                            Endoscope was introduced through the mouth, and                            advanced to the lower third of esophagus. The upper                            GI endoscopy was accomplished without difficulty.                            The patient tolerated the procedure well. Scope In: Scope Out: Findings:      The larynx was normal.      One benign-appearing, intrinsic severe stenosis was found 33 cm from the       incisors. The stenosis was not able to be traversed despite using the       ultraslim scope. This was stented with a 23 mm x 10.5 cm WallFlex       covered stent under fluoroscopic guidance. We passed the Jagwire under       fluoroscopy guidance and the scope was removed keeping the wire in the       proper position in the stomach and then the stent was inserted under       fluoroscopy over the wire the scope was reinserted next to the stent and       the  stent was released using both endoscopic and fluoroscopic guidance       and the wire and introducer was removed and there was a tight narrowing       in the middle of the stent and we could not easily advance the ultra       slim at this time the scope was removed and the procedure was terminated       and the patient tolerated the procedure well      The exam was otherwise without abnormality. Impression:               - Normal larynx.                           - Benign-appearing esophageal stenosis. Prosthesis  placed.                           - The examination was otherwise normal.                           - No specimens collected. Recommendation:           - Clear liquid diet today. Soft solids tomorrow and                            will need to teach patient antireflux measures as                            well as a post stent diet to include chewing food                            well drink plenty of liquids eating slowly                           - Resume Eliquis (apixaban) at prior dose tomorrow                            if doing well from a GI standpoint.                           - Return to GI clinic in 4 weeks. Stent removal in                            2 to 3 months depending on how he is doing                           - Telephone GI clinic if symptomatic PRN. Procedure Code(s):        --- Professional ---                           [phone], Esophagoscopy, flexible, transoral; with                            placement of endoscopic stent (includes pre- and                            post-dilation and guide wire passage, when                            performed) Diagnosis Code(s):        --- Professional ---                           K22.2, Esophageal obstruction                           R13.10, Dysphagia, unspecified CPT copyright 2019 American Medical Association. All rights reserved. The codes documented in this report are preliminary and  upon coder review may  be revised to meet  current compliance requirements. Clarene Essex, MD 10/30/2019 1:47:54 PM This report has been signed electronically. Number of Addenda: 0         Discharge Exam: Blood pressure 113/80, pulse 80, temperature 98.4 F (36.9 C), temperature source Oral, resp. rate 15, height 5\' 8"  (1.727 m), weight 70.4 kg, SpO2 98 %.   Physical Exam: General appearance:alert, cooperative and no distress Heart:regular rate and rhythm Lungs:clear to auscultation bilaterally Abdomen:soft, flat, active bowel sounds, no tenderness. The J tube is secure and the site is clean and dry. Wounds- The chest incision and chest tube sites have healed well.   Disposition: Discharge disposition: 01-Home or Self Care       Discharge Instructions    Discharge patient   Complete by: As directed    Discharge disposition: 01-Home or Self Care   Discharge patient date: 11/03/2019     Allergies as of 11/03/2019      Reactions   Sulfa Antibiotics Nausea Only      Medication List    STOP taking these medications   acetaminophen 325 MG tablet Commonly known as: TYLENOL   feeding supplement (OSMOLITE 1.5 CAL) Liqd   folic acid 1 MG tablet Commonly known as: FOLVITE   HYDROcodone-acetaminophen 5-325 MG tablet Commonly known as: NORCO/VICODIN   hydrocortisone cream 1 %   omeprazole 20 MG capsule Commonly known as: PriLOSEC   thiamine 100 MG tablet     TAKE these medications   apixaban 5 MG Tabs tablet Commonly known as: ELIQUIS Take 1 tablet (5 mg total) by mouth 2 (two) times daily. Continue this medication for 6 months. What changed:   how to take this  additional instructions   ferrous sulfate 300 (60 Fe) MG/5ML syrup Place 5 mLs (300 mg total) into feeding tube 3 (three) times daily with meals.   free water Soln Place 100 mLs into feeding tube every 4 (four) hours.   loperamide HCl 1 MG/7.5ML suspension Commonly known as: IMODIUM Place 15  mLs (2 mg total) into feeding tube as needed for diarrhea or loose stools.   pantoprazole 40 MG tablet Commonly known as: Protonix Take 1 tablet (40 mg total) by mouth 2 (two) times daily.   QUEtiapine 25 MG tablet Commonly known as: SEROQUEL Take 1 tablet (25 mg total) by mouth at bedtime for 7 days.   sucralfate 1 GM/10ML suspension Commonly known as: CARAFATE Take 10 mLs (1 g total) by mouth 4 (four) times daily -  with meals and at bedtime.   traMADol 50 MG tablet Commonly known as: ULTRAM 1 tablet (50 mg total) by Per J Tube route every 6 (six) hours as needed for up to 7 days for moderate pain.            Durable Medical Equipment  (From admission, onward)         Start     Ordered   10/17/19 1622  For home use only DME Tube feeding  Once    Comments: Initiate bolus tube feeding regimen via [place] of G-J tube: - 1 carton (237 ml) Osmolite 1.5 cal formula QID  Tube feeding regimen provides 1422 kcal, 64 grams of protein, and 722 ml of H2O (65% minimum kcal needs, 58% minimum protein needs   10/17/19 1622   09/29/19 0825  DME Walker  (Discharge Planning)  Once    Comments: This is part of order set with no option to change date. He will not need the walker until next week.  Question Answer Comment  Walker: With [place]   Patient needs a walker to treat with the following condition Weakness generalized      09/28/19 0830         Follow-up Information    Melrose Nakayama, MD. Go on 11/20/2019.   Specialty: Cardiothoracic Surgery Why: You have an appointment with Dr. [name] on Tuesday, February 9 at [facility] information: [place] [address] [phone]        [place] Follow up.   Why: rolling walker, tube feeding supplies for bolus feeds       [facility]. Go on 11/12/2019.   Specialty: Family Medicine Why: You have an appointment with Juluis Mire at [facility] on Monday  11/12/19 at 09:30am. Contact information: [place] [address] [place] Follow up.   Why: HHRN thru [place] .  Dr. Shela Leff  On Friday, 11/09/19 at 9:30am.   [phone]   Specialty: Colo-Rectal Surgery Contact information: [address] [place] [place] [address]        Clarene Essex, MD. Call on 11/29/2019.   Specialty: Gastroenterology Why: You have a televisit scheduled with Dr. Watt Climes on Thursday 11/29/19 at 8:45am. Contact information: [address] [place] [address] [phone]           Signed: John Giovanni, PA-C 11/03/2019, 10:18 AM

## 2019-09-07 NOTE — Progress Notes (Signed)
Pharmacy Antibiotic Note  George Wade. is a 63 y.o. male admitted on 09/03/2019 with sepsis secondary to perforated/necrotic esophagus.  Pharmacy has been consulted for Vancomycin/Zosyn dosing. WBC improving. Renal function function ok, but a little up from apparent baseline.  Growing candida albicans in pleural fluid/lung tissue.  Spoke to [place], will change vancomycin to fluconazole.  Plan: Fluconazole 400 mg IV daily, could treat up to 2 weeks depending on clinical improvement. Zosyn 3.375G IV q8h to be infused over 4 hours Trend WBC, temp, renal function  F/U infectious work-up Drug levels as indicated  Temp (24hrs), Avg:98 F (36.7 C), Min:97.9 F (36.6 C), Max:98.4 F (36.9 C)  Recent Labs  Lab 09/03/19 1112 09/03/19 2028 09/03/19 2121 09/04/19 0537 09/05/19 0300 09/05/19 1556 09/06/19 0213  WBC 12.4* 31.5*  --  11.6* 7.8  --  7.2  CREATININE 0.45* 0.98  --  0.51* 0.42* 0.43* 0.35*  LATICACIDVEN  --   --  10.5*  --   --   --   --     Estimated Creatinine Clearance: 91.4 mL/min (A) (by C-G formula based on SCr of 0.35 mg/dL (L)).    Allergies  Allergen Reactions  . Sulfa Antibiotics Nausea Only    Vanc 11/24>>11/27 Zosyn 11/23>> Fluconazole 11/27 >  11/24 Tissue R lung: no orgs seen; rare candida albicans 11/24 R pleural fluid - rare candida albicans 11/23 BCx x 2 (from [facility]): NGTD  [place] Crane, Roylene Reason, [place] Pharmacist Phone [phone]  09/07/2019 2:31 PM

## 2019-09-07 NOTE — Progress Notes (Addendum)
Nutrition Follow-up  DOCUMENTATION CODES:   Non-severe (moderate) malnutrition in context of social or environmental circumstances  INTERVENTION:  Discontinue Vital High Protein.  Initiate Osmolite 1.5 formula via J tube at rate of 30 ml/hr and increase by 10 ml every 4 hours to goal rate of 60 ml/hr.  Provide 30 ml Prostat BID via J-tube.   Tube feeding to provide 2360 kcal (100% of needs), 120 grams of protein, and 1097 ml free water.  NUTRITION DIAGNOSIS:   Moderate Malnutrition related to social / environmental circumstances(homeless, ETOH abuse) as evidenced by moderate muscle depletion, moderate fat depletion, mild fat depletion; ongoing  GOAL:   Patient will meet greater than or equal to 90% of their needs; progressing with TF  MONITOR:   Weight trends, Labs, I & O's, TF tolerance, Skin  REASON FOR ASSESSMENT:   Malnutrition Screening Tool    ASSESSMENT:   Patient with PMH significant for ETOH abuse, non-hodgkin's lymphoma, tonsillar cancer, esophogeal stricture s/p PEG, and HTN. Presents this admission with esophageal perforation.   11/24- R thoracotomy, repair of perforation, extubated  11/25- Gtube converted to [place] tube  Pt continues on NPO status with NG in place to suction with 358 ml output. IR has converted existing Gtube to a GJ tube to start tube feedings 11/25. Pt currently has Vital High Protein infusing via J-tube at goal rate of 40 ml/hr with 30 ml Prostat BID which provides 1160 kcal (53% of kcal needs) and 114 grams of protein. Pt has been tolerating his tube feedings well. RD to modify tube feeding to better meet nutrition needs.   Labs and medications reviewed. Phosphorous low at 2.4.  Diet Order:   Diet Order            Diet NPO time specified  Diet effective now              EDUCATION NEEDS:   Not appropriate for education at this time  Skin:  Skin Assessment: Skin Integrity Issues: Skin Integrity Issues:: Incisions Incisions: R  chest Other: N/A  Last BM:  11/16  Height:   Ht Readings from Last 1 Encounters:  09/04/19 5\' 8"  (1.727 m)    Weight:   Wt Readings from Last 1 Encounters:  09/07/19 70.5 kg    Ideal Body Weight:  70 kg  BMI:  Body mass index is 23.63 kg/m.  Estimated Nutritional Needs:   Kcal:  2200-2400 kcal  Protein:  110-125 grams  Fluid:  >/= 2.2 L/day    Corrin Parker, MS, RD, LDN Pager # [phone] After hours/ weekend pager # [phone]

## 2019-09-08 LAB — BPAM RBC
Blood Product Expiration Date: 202012172359
Blood Product Expiration Date: 202012172359
Blood Product Expiration Date: 202012172359
Blood Product Expiration Date: 202012182359
Unit Type and Rh: 7300
Unit Type and Rh: 7300
Unit Type and Rh: 7300
Unit Type and Rh: 7300

## 2019-09-08 LAB — BASIC METABOLIC PANEL
Anion gap: 9 (ref 5–15)
BUN: 12 mg/dL (ref 8–23)
CO2: 26 mmol/L (ref 22–32)
Calcium: 8.8 mg/dL — ABNORMAL LOW (ref 8.9–10.3)
Chloride: 109 mmol/L (ref 98–111)
Creatinine, Ser: 0.82 mg/dL (ref 0.61–1.24)
GFR calc Af Amer: >60 mL/min (ref >60)
GFR calc non Af Amer: >60 mL/min (ref >60)
Glucose, Bld: 114 mg/dL — ABNORMAL HIGH (ref 70–99)
Potassium: 3.4 mmol/L — ABNORMAL LOW (ref 3.5–5.1)
Sodium: 144 mmol/L (ref 135–145)

## 2019-09-08 LAB — TYPE AND SCREEN
ABO/RH(D): B POS
Antibody Screen: NEGATIVE
Unit division: 0
Unit division: 0
Unit division: 0
Unit division: 0

## 2019-09-08 LAB — GLUCOSE, CAPILLARY
Glucose-Capillary: 115 mg/dL — ABNORMAL HIGH (ref 70–99)
Glucose-Capillary: 113 mg/dL — ABNORMAL HIGH (ref 70–99)
Glucose-Capillary: 109 mg/dL — ABNORMAL HIGH (ref 70–99)
Glucose-Capillary: 107 mg/dL — ABNORMAL HIGH (ref 70–99)
Glucose-Capillary: 114 mg/dL — ABNORMAL HIGH (ref 70–99)
Glucose-Capillary: 108 mg/dL — ABNORMAL HIGH (ref 70–99)

## 2019-09-08 LAB — CBC
HCT: 32.6 % — ABNORMAL LOW (ref 39.0–52.0)
Hemoglobin: 9.9 g/dL — ABNORMAL LOW (ref 13.0–17.0)
MCH: 26.3 pg (ref 26.0–34.0)
MCHC: 30.4 g/dL (ref 30.0–36.0)
MCV: 86.7 fL (ref 80.0–100.0)
Platelets: 260 10*3/uL (ref 150–400)
RBC: 3.76 MIL/uL — ABNORMAL LOW (ref 4.22–5.81)
RDW: 20.4 % — ABNORMAL HIGH (ref 11.5–15.5)
WBC: 9.5 10*3/uL (ref 4.0–10.5)
nRBC: 0 % (ref 0.0–0.2)

## 2019-09-08 LAB — CULTURE, BLOOD (ROUTINE X 2)
Culture: NO GROWTH
Culture: NO GROWTH
Special Requests: ADEQUATE
Special Requests: ADEQUATE

## 2019-09-08 MED ORDER — SODIUM BICARBONATE 650 MG PO TABS
650.0000 mg | ORAL_TABLET | Freq: Once | ORAL | Status: AC
  Administered 2019-09-08: 650 mg
  Filled 2019-09-08: qty 1

## 2019-09-08 MED ORDER — PANCRELIPASE (LIP-PROT-AMYL) 10440-39150 UNITS PO TABS
20880.0000 [IU] | ORAL_TABLET | Freq: Once | ORAL | Status: AC
  Administered 2019-09-08: 20880 [IU]
  Filled 2019-09-08: qty 2

## 2019-09-08 MED ORDER — POTASSIUM CHLORIDE 10 MEQ/50ML IV SOLN
10.0000 meq | INTRAVENOUS | Status: AC
  Administered 2019-09-08 (×3): 10 meq via INTRAVENOUS
  Filled 2019-09-08 (×3): qty 50

## 2019-09-08 NOTE — Plan of Care (Signed)
  Problem: Education: Goal: Knowledge of General Education information will improve Description: Including pain rating scale, medication(s)/side effects and non-pharmacologic comfort measures Outcome: Progressing   Problem: Health Behavior/Discharge Planning: Goal: Ability to manage health-related needs will improve Outcome: Progressing   Problem: Clinical Measurements: Goal: Ability to maintain clinical measurements within normal limits will improve Outcome: Progressing Goal: Will remain free from infection Outcome: Progressing Goal: Diagnostic test results will improve Outcome: Progressing Goal: Respiratory complications will improve Outcome: Progressing Goal: Cardiovascular complication will be avoided Outcome: Progressing   Problem: Activity: Goal: Risk for activity intolerance will decrease Outcome: Progressing   Problem: Nutrition: Goal: Adequate nutrition will be maintained Outcome: Progressing   Problem: Coping: Goal: Level of anxiety will decrease Outcome: Progressing   Problem: Elimination: Goal: Will not experience complications related to bowel motility Outcome: Progressing Goal: Will not experience complications related to urinary retention Outcome: Progressing   Problem: Pain Managment: Goal: General experience of comfort will improve Outcome: Progressing   Problem: Safety: Goal: Ability to remain free from injury will improve Outcome: Progressing   Problem: Skin Integrity: Goal: Risk for impaired skin integrity will decrease Outcome: Progressing   Problem: Bowel/Gastric: Goal: Gastrointestinal status for postoperative course will improve Outcome: Progressing   Problem: Nutritional: Goal: Ability to achieve adequate nutritional intake will improve Outcome: Progressing   Problem: Clinical Measurements: Goal: Postoperative complications will be avoided or minimized Outcome: Progressing   Problem: Respiratory: Goal: Ability to maintain a  clear airway will improve Outcome: Progressing   Problem: Skin Integrity: Goal: Demonstration of wound healing without infection will improve Outcome: Progressing

## 2019-09-08 NOTE — Progress Notes (Signed)
Pt's J tube clogged this AM when to flush the AM meds.  Tried to flush line Per policy, notified pharmacy  And still not success, MD notified this afternoon. Dr. Kipp Brood advised to hold off at this time, and will deal with it later.

## 2019-09-08 NOTE — Progress Notes (Signed)
     [address]       [place] [address]             [phone]       No events  Today's Vitals   09/08/19 0400 09/08/19 0602 09/08/19 0748 09/08/19 0817  BP: (!) 142/105  (!) 148/112   Pulse: (!) 112  (!) 107   Resp: 17  19   Temp: 98.4 F (36.9 C)  98.1 F (36.7 C)   TempSrc: Oral  Oral   SpO2: 97%  96%   Weight: 67.7 kg     Height:      PainSc:  Asleep 8  7    Body mass index is 22.69 kg/m.  Alert NAD Sinus tach EWOB.  No leak on CT. SS output  POD 5 s/p esophageal perforation repair Will remove foley On goal tube feeds Will keep NG tube in for now pulm toilet

## 2019-09-09 LAB — GLUCOSE, CAPILLARY
Glucose-Capillary: 101 mg/dL — ABNORMAL HIGH (ref 70–99)
Glucose-Capillary: 110 mg/dL — ABNORMAL HIGH (ref 70–99)
Glucose-Capillary: 114 mg/dL — ABNORMAL HIGH (ref 70–99)
Glucose-Capillary: 104 mg/dL — ABNORMAL HIGH (ref 70–99)
Glucose-Capillary: 116 mg/dL — ABNORMAL HIGH (ref 70–99)
Glucose-Capillary: 105 mg/dL — ABNORMAL HIGH (ref 70–99)

## 2019-09-09 LAB — CBC
HCT: 33.6 % — ABNORMAL LOW (ref 39.0–52.0)
Hemoglobin: 10 g/dL — ABNORMAL LOW (ref 13.0–17.0)
MCH: 26.2 pg (ref 26.0–34.0)
MCHC: 29.8 g/dL — ABNORMAL LOW (ref 30.0–36.0)
MCV: 88.2 fL (ref 80.0–100.0)
Platelets: 299 10*3/uL (ref 150–400)
RBC: 3.81 MIL/uL — ABNORMAL LOW (ref 4.22–5.81)
RDW: 20.8 % — ABNORMAL HIGH (ref 11.5–15.5)
WBC: 9.7 10*3/uL (ref 4.0–10.5)
nRBC: 0 % (ref 0.0–0.2)

## 2019-09-09 LAB — AEROBIC/ANAEROBIC CULTURE W GRAM STAIN (SURGICAL/DEEP WOUND): Gram Stain: NONE SEEN

## 2019-09-09 NOTE — Progress Notes (Signed)
During shift change, RN to RN handoff report, Pt was noted to have something in his hands wrap in his bedding and his NG tube was not in his nose.  Pt indicated that his night nurse and one other has told him to remove it.  Enid Derry, his night nurse, advised him that she did not tell him to remove the NG tube.  The Pt admitted to have remove his NG tube.   Pt has been noted picking at his bedding, taking his ring off of his finger (identifying it as his car keys) and trying to put them in his pocket ( which is diaper, which was place to protected the Condemn catheter)

## 2019-09-09 NOTE — Progress Notes (Signed)
     [address]       [place] [address]             [phone]       Pt pulled his NG tube. PEG-J clogged.  Nurses have been giving medication through the tube Nurses also report some hallucinations  Today's Vitals   09/09/19 0300 09/09/19 0320 09/09/19 0512 09/09/19 0755  BP:  (!) 135/113  (!) 134/106  Pulse:  (!) 108    Resp: 14 17  16   Temp:  (!) 97.5 F (36.4 C)  98.5 F (36.9 C)  TempSrc:  Oral  Oral  SpO2:    98%  Weight:  65 kg    Height:      PainSc: Asleep  2  8    Body mass index is 21.79 kg/m.  Alert NAD Sinus tach EWOB.  No leak on CT. SS output  POD 6 s/p esophageal perforation repair Will send to IR for PEG-J declogging in am Will likely get swallow study tomorrow as well. Will hold narcotics  Danay Mckellar O Javonta Gronau

## 2019-09-09 NOTE — Progress Notes (Signed)
RNs entered pts room to give bedside report and pt had removed his NG Tube. Pt stated  " I sneezed and it practically flew out of my nose." Will continue to monitor.

## 2019-09-09 NOTE — Plan of Care (Signed)
  Problem: Education: Goal: Knowledge of General Education information will improve Description: Including pain rating scale, medication(s)/side effects and non-pharmacologic comfort measures 09/09/2019 0734 by Shanon Ace, RN Outcome: Not Progressing 09/09/2019 0733 by Shanon Ace, RN Outcome: Progressing   Problem: Clinical Measurements: Goal: Ability to maintain clinical measurements within normal limits will improve 09/09/2019 0734 by Shanon Ace, RN Outcome: Not Progressing 09/09/2019 0733 by Shanon Ace, RN Outcome: Progressing Goal: Will remain free from infection 09/09/2019 0734 by Shanon Ace, RN Outcome: Progressing 09/09/2019 0733 by Shanon Ace, RN Outcome: Progressing Goal: Diagnostic test results will improve 09/09/2019 0734 by Shanon Ace, RN Outcome: Progressing 09/09/2019 0733 by Shanon Ace, RN Outcome: Progressing Goal: Respiratory complications will improve 09/09/2019 0734 by Shanon Ace, RN Outcome: Progressing 09/09/2019 0733 by Shanon Ace, RN Outcome: Progressing Goal: Cardiovascular complication will be avoided 09/09/2019 0734 by Shanon Ace, RN Outcome: Progressing 09/09/2019 0733 by Shanon Ace, RN Outcome: Progressing   Problem: Health Behavior/Discharge Planning: Goal: Ability to manage health-related needs will improve 09/09/2019 0734 by Shanon Ace, RN Outcome: Not Progressing 09/09/2019 0733 by Shanon Ace, RN Outcome: Progressing   Problem: Activity: Goal: Risk for activity intolerance will decrease 09/09/2019 0734 by Shanon Ace, RN Outcome: Not Progressing 09/09/2019 0733 by Shanon Ace, RN Outcome: Progressing   Problem: Nutrition: Goal: Adequate nutrition will be maintained 09/09/2019 0734 by Shanon Ace, RN Outcome: Progressing 09/09/2019 0733 by Shanon Ace, RN Outcome: Progressing   Problem: Coping: Goal: Level of anxiety will decrease 09/09/2019 0734 by Shanon Ace, RN Outcome:  Not Progressing 09/09/2019 0733 by Shanon Ace, RN Outcome: Progressing   Problem: Elimination: Goal: Will not experience complications related to bowel motility 09/09/2019 0734 by Shanon Ace, RN Outcome: Progressing 09/09/2019 0733 by Shanon Ace, RN Outcome: Progressing Goal: Will not experience complications related to urinary retention 09/09/2019 0734 by Shanon Ace, RN Outcome: Progressing 09/09/2019 0733 by Shanon Ace, RN Outcome: Progressing   Problem: Pain Managment: Goal: General experience of comfort will improve 09/09/2019 0734 by Shanon Ace, RN Outcome: Not Progressing 09/09/2019 0733 by Shanon Ace, RN Outcome: Progressing   Problem: Safety: Goal: Ability to remain free from injury will improve 09/09/2019 0734 by Shanon Ace, RN Outcome: Not Progressing 09/09/2019 0733 by Shanon Ace, RN Outcome: Progressing   Problem: Skin Integrity: Goal: Risk for impaired skin integrity will decrease 09/09/2019 0734 by Shanon Ace, RN Outcome: Progressing 09/09/2019 0733 by Shanon Ace, RN Outcome: Progressing   Problem: Bowel/Gastric: Goal: Gastrointestinal status for postoperative course will improve 09/09/2019 0734 by Shanon Ace, RN Outcome: Progressing 09/09/2019 0733 by Shanon Ace, RN Outcome: Progressing   Problem: Nutritional: Goal: Ability to achieve adequate nutritional intake will improve 09/09/2019 0734 by Shanon Ace, RN Outcome: Not Progressing 09/09/2019 0733 by Shanon Ace, RN Outcome: Progressing   Problem: Clinical Measurements: Goal: Postoperative complications will be avoided or minimized 09/09/2019 0734 by Shanon Ace, RN Outcome: Progressing 09/09/2019 0733 by Shanon Ace, RN Outcome: Progressing   Problem: Respiratory: Goal: Ability to maintain a clear airway will improve 09/09/2019 0734 by Shanon Ace, RN Outcome: Progressing 09/09/2019 0733 by Shanon Ace, RN Outcome: Progressing    Problem: Skin Integrity: Goal: Demonstration of wound healing without infection will improve 09/09/2019 0734 by Shanon Ace, RN Outcome: Progressing 09/09/2019 [place] by Shanon Ace, RN Outcome: Progressing

## 2019-09-10 LAB — BASIC METABOLIC PANEL
Anion gap: 14 (ref 5–15)
BUN: 10 mg/dL (ref 8–23)
CO2: 25 mmol/L (ref 22–32)
Calcium: 8.6 mg/dL — ABNORMAL LOW (ref 8.9–10.3)
Chloride: 106 mmol/L (ref 98–111)
Creatinine, Ser: 0.78 mg/dL (ref 0.61–1.24)
GFR calc Af Amer: >60 mL/min (ref >60)
GFR calc non Af Amer: >60 mL/min (ref >60)
Glucose, Bld: 99 mg/dL (ref 70–99)
Potassium: 3 mmol/L — ABNORMAL LOW (ref 3.5–5.1)
Sodium: 145 mmol/L (ref 135–145)

## 2019-09-10 LAB — GLUCOSE, CAPILLARY
Glucose-Capillary: 101 mg/dL — ABNORMAL HIGH (ref 70–99)
Glucose-Capillary: 91 mg/dL (ref 70–99)
Glucose-Capillary: 92 mg/dL (ref 70–99)
Glucose-Capillary: 92 mg/dL (ref 70–99)
Glucose-Capillary: 97 mg/dL (ref 70–99)

## 2019-09-10 MED ORDER — LORAZEPAM 2 MG/ML IJ SOLN
0.5000 mg | Freq: Four times a day (QID) | INTRAMUSCULAR | Status: DC
  Administered 2019-09-10 – 2019-09-11 (×3): 0.5 mg via INTRAVENOUS
  Filled 2019-09-10 (×3): qty 1

## 2019-09-10 MED ORDER — POTASSIUM CHLORIDE 10 MEQ/50ML IV SOLN
10.0000 meq | INTRAVENOUS | Status: AC
  Administered 2019-09-10 (×3): 10 meq via INTRAVENOUS
  Filled 2019-09-10 (×3): qty 50

## 2019-09-10 MED ORDER — QUETIAPINE FUMARATE 50 MG PO TABS
50.0000 mg | ORAL_TABLET | Freq: Every day | ORAL | Status: DC
  Administered 2019-09-10: 50 mg via JEJUNOSTOMY
  Filled 2019-09-10: qty 1

## 2019-09-10 NOTE — Plan of Care (Signed)
  Problem: Education: Goal: Knowledge of General Education information will improve Description: Including pain rating scale, medication(s)/side effects and non-pharmacologic comfort measures Outcome: Not Progressing   Problem: Health Behavior/Discharge Planning: Goal: Ability to manage health-related needs will improve Outcome: Not Progressing   Problem: Clinical Measurements: Goal: Ability to maintain clinical measurements within normal limits will improve Outcome: Not Progressing Goal: Will remain free from infection Outcome: Not Progressing Goal: Diagnostic test results will improve Outcome: Not Progressing Goal: Respiratory complications will improve Outcome: Not Progressing Goal: Cardiovascular complication will be avoided Outcome: Not Progressing   Problem: Activity: Goal: Risk for activity intolerance will decrease Outcome: Not Progressing   Problem: Nutrition: Goal: Adequate nutrition will be maintained Outcome: Not Progressing   Problem: Coping: Goal: Level of anxiety will decrease Outcome: Not Progressing   Problem: Elimination: Goal: Will not experience complications related to bowel motility Outcome: Not Progressing Goal: Will not experience complications related to urinary retention Outcome: Not Progressing   Problem: Pain Managment: Goal: General experience of comfort will improve Outcome: Not Progressing   Problem: Safety: Goal: Ability to remain free from injury will improve Outcome: Not Progressing   Problem: Skin Integrity: Goal: Risk for impaired skin integrity will decrease Outcome: Not Progressing   Problem: Bowel/Gastric: Goal: Gastrointestinal status for postoperative course will improve Outcome: Not Progressing   Problem: Nutritional: Goal: Ability to achieve adequate nutritional intake will improve Outcome: Not Progressing   Problem: Clinical Measurements: Goal: Postoperative complications will be avoided or minimized Outcome: Not  Progressing   Problem: Respiratory: Goal: Ability to maintain a clear airway will improve Outcome: Not Progressing   Problem: Skin Integrity: Goal: Demonstration of wound healing without infection will improve Outcome: Not Progressing

## 2019-09-10 NOTE — Progress Notes (Signed)
Pharmacy Antibiotic Note  George Wade. is a 63 y.o. male admitted on 09/03/2019 with sepsis secondary to perforated/necrotic esophagus.  Pharmacy has been consulted for Vancomycin/Zosyn dosing. WBC WNL. Renal function function ok, but a little up from apparent baseline.  Growing candida albicans in pleural fluid/lung tissue.  Spoke to [employer], changed vancomycin to fluconazole.  Plan: Fluconazole 400 mg IV daily, could treat up to 2 weeks depending on clinical improvement. Zosyn 3.375G IV q8h to be infused over 4 hours - ends today. Trend WBC, temp, renal function  F/U infectious work-up Drug levels as indicated  Temp (24hrs), Avg:98.3 F (36.8 C), Min:98.1 F (36.7 C), Max:98.6 F (37 C)  Recent Labs  Lab 09/03/19 2121 09/04/19 0537 09/05/19 0300 09/05/19 1556 09/06/19 0213 09/08/19 0454 09/09/19 1138 09/10/19 0401  WBC  --  11.6* 7.8  --  7.2 9.5 9.7  --   CREATININE  --  0.51* 0.42* 0.43* 0.35* 0.82  --  0.78  LATICACIDVEN 10.5*  --   --   --   --   --   --   --     Estimated Creatinine Clearance: 89.8 mL/min (by C-G formula based on SCr of 0.78 mg/dL).    Allergies  Allergen Reactions  . Sulfa Antibiotics Nausea Only    Vanc 11/24>>11/27 Zosyn 11/23>>11/30 Fluconazole 11/27>>  11/24 Tissue R lung: rare candida albicans; rare viridans strep 11/23 BCx x 2 (from [facility]): ngF  Marguerite Olea, [facility] Clinical Pharmacist Phone [phone]  09/10/2019 10:55 AM

## 2019-09-10 NOTE — Progress Notes (Signed)
7 Days Post-Op Procedure(s) (LRB): THORACOTOMY MAJOR FOR ESOPHAGEAL PERFORATION WITH INTERCOSTAL MUSCLE FLAP (Right) Subjective: Resting quietly, awakened easily. Generally calm but confused. RN reports he has not slept in 3 days. Pulled out his NG Friday evening and the PEG-J tube became non-functional yesterday.   Objective: Vital signs in last 24 hours: Temp:  [98.1 F (36.7 C)-98.6 F (37 C)] 98.4 F (36.9 C) (11/30 0322) Pulse Rate:  [101-113] 101 (11/30 0322) Cardiac Rhythm: Sinus tachycardia (11/30 0700) Resp:  [16-26] 17 (11/30 0556) BP: (126-154)/(97-123) 133/123 (11/30 0322) SpO2:  [96 %-99 %] 98 % (11/30 0322) Weight:  [67.2 kg] 67.2 kg (11/30 0322)    Intake/Output from previous day: 11/29 0701 - 11/30 0700 In: 674.2 [I.V.:279.5; IV Piggyback:394.7] Out: 1280 [Urine:1200; Chest Tube:80] Intake/Output this shift: No intake/output data recorded.  General appearance: alert and confused. Hands are in soft mittens.  Neurologic: Moves all extremities, confused. Heart: SR. Lungs: clear to auscultation bilaterally Abdomen: Soft, no apparent tenderness. Few bowel sounds. The PEG-J tube is sucured and connected to a straight drain but stopcock was in the off position. Exit site is dry.  Extremities: All well perfused, no edema. SCD's in place.  Wound: the right chest tubes are secure.  Right thoracotomy dressing is dry.    Lab Results: Recent Labs    09/08/19 0454 09/09/19 1138  WBC 9.5 9.7  HGB 9.9* 10.0*  HCT 32.6* 33.6*  PLT 260 299   BMET:  Recent Labs    09/08/19 0454 09/10/19 0401  NA 144 145  K 3.4* 3.0*  CL 109 106  CO2 26 25  GLUCOSE 114* 99  BUN 12 10  CREATININE 0.82 0.78  CALCIUM 8.8* 8.6*    PT/INR: No results for input(s): LABPROT, INR in the last 72 hours. ABG    Component Value Date/Time   PHART 7.426 09/04/2019 1241   HCO3 26.5 09/04/2019 1241   TCO2 28 09/04/2019 1241   ACIDBASEDEF 6.0 (H) 09/04/2019 0522   O2SAT 99.0 09/04/2019  1241   CBG (last 3)  Recent Labs    09/09/19 2010 09/10/19 0035 09/10/19 0327  GLUCAP 105* 101* 91    Assessment/Plan: S/P Procedure(s) (LRB): THORACOTOMY MAJOR FOR ESOPHAGEAL PERFORATION WITH INTERCOSTAL MUSCLE FLAP (Right)  -POD-7 right thoracotomy, repair of esophogeal perforation with intercostal muscle flap. Afebrile and last WBC (on 11/29) normal. Plan to discontinue IV Zosyn after today's doses.  -Nutrition- Previously placed PEG was converted to PEG-J tube early post-op and has reportedly stopped working.  Will ask IR to replace the PEG-J tube and resume nutritional support.   -EtOH abuse-on detox protocol / Precedex early post-op. Now on Ativan po q6h and reasonably calm on morning rounds. Not sleeping at night per RN's report, will add Seroquel qHS.   -Mild anemia- Hct. Stable, continue to monitor.   -Hypokalemia-Replace with IV supplement today. Monitor.   -DVT PPX- continue [place] Lovenox.    LOS: 7 days    [name], [place] [phone] 09/10/2019

## 2019-09-10 NOTE — Progress Notes (Signed)
Pleur-vac changed. No complications, will continue to monitor

## 2019-09-11 ENCOUNTER — Inpatient Hospital Stay (HOSPITAL_COMMUNITY): Payer: Self-pay

## 2019-09-11 ENCOUNTER — Encounter (HOSPITAL_COMMUNITY): Payer: Self-pay | Admitting: Diagnostic Radiology

## 2019-09-11 HISTORY — PX: IR CM INJ ANY COLONIC TUBE W/FLUORO: IMG2336

## 2019-09-11 LAB — BASIC METABOLIC PANEL
Anion gap: 14 (ref 5–15)
BUN: 11 mg/dL (ref 8–23)
CO2: 23 mmol/L (ref 22–32)
Calcium: 9 mg/dL (ref 8.9–10.3)
Chloride: 109 mmol/L (ref 98–111)
Creatinine, Ser: 0.96 mg/dL (ref 0.61–1.24)
GFR calc Af Amer: >60 mL/min (ref >60)
GFR calc non Af Amer: >60 mL/min (ref >60)
Glucose, Bld: 95 mg/dL (ref 70–99)
Potassium: 3.1 mmol/L — ABNORMAL LOW (ref 3.5–5.1)
Sodium: 146 mmol/L — ABNORMAL HIGH (ref 135–145)

## 2019-09-11 LAB — CBC
HCT: 32.8 % — ABNORMAL LOW (ref 39.0–52.0)
Hemoglobin: 9.9 g/dL — ABNORMAL LOW (ref 13.0–17.0)
MCH: 26.3 pg (ref 26.0–34.0)
MCHC: 30.2 g/dL (ref 30.0–36.0)
MCV: 87.2 fL (ref 80.0–100.0)
Platelets: 290 10*3/uL (ref 150–400)
RBC: 3.76 MIL/uL — ABNORMAL LOW (ref 4.22–5.81)
RDW: 19.8 % — ABNORMAL HIGH (ref 11.5–15.5)
WBC: 7.8 10*3/uL (ref 4.0–10.5)
nRBC: 0 % (ref 0.0–0.2)

## 2019-09-11 LAB — GLUCOSE, CAPILLARY
Glucose-Capillary: 106 mg/dL — ABNORMAL HIGH (ref 70–99)
Glucose-Capillary: 140 mg/dL — ABNORMAL HIGH (ref 70–99)
Glucose-Capillary: 174 mg/dL — ABNORMAL HIGH (ref 70–99)
Glucose-Capillary: 96 mg/dL (ref 70–99)

## 2019-09-11 MED ORDER — LIDOCAINE VISCOUS HCL 2 % MT SOLN
OROMUCOSAL | Status: AC
  Filled 2019-09-11: qty 15

## 2019-09-11 MED ORDER — IOHEXOL 300 MG/ML  SOLN
150.0000 mL | Freq: Once | INTRAMUSCULAR | Status: AC | PRN
  Administered 2019-09-11: 100 mL via ORAL

## 2019-09-11 MED ORDER — LORAZEPAM 2 MG/ML IJ SOLN
0.5000 mg | Freq: Four times a day (QID) | INTRAMUSCULAR | Status: DC | PRN
  Administered 2019-09-11 – 2019-10-22 (×4): 0.5 mg via INTRAVENOUS
  Filled 2019-09-11 (×4): qty 1

## 2019-09-11 MED ORDER — IOHEXOL 300 MG/ML  SOLN
50.0000 mL | Freq: Once | INTRAMUSCULAR | Status: AC | PRN
  Administered 2019-09-11: 10 mL

## 2019-09-11 MED ORDER — POTASSIUM CHLORIDE 10 MEQ/50ML IV SOLN
10.0000 meq | INTRAVENOUS | Status: AC
  Administered 2019-09-11: 10 meq via INTRAVENOUS
  Filled 2019-09-11: qty 50

## 2019-09-11 MED ORDER — POTASSIUM CHLORIDE 10 MEQ/50ML IV SOLN
10.0000 meq | INTRAVENOUS | Status: AC
  Administered 2019-09-11 (×3): 10 meq via INTRAVENOUS
  Filled 2019-09-11 (×3): qty 50

## 2019-09-11 MED ORDER — QUETIAPINE FUMARATE 100 MG PO TABS
100.0000 mg | ORAL_TABLET | Freq: Every day | ORAL | Status: DC
  Administered 2019-09-11 – 2019-10-30 (×50): 100 mg via JEJUNOSTOMY
  Filled 2019-09-11 (×51): qty 1

## 2019-09-11 NOTE — Progress Notes (Signed)
8 Days Post-Op Procedure(s) (LRB): THORACOTOMY MAJOR FOR ESOPHAGEAL PERFORATION WITH INTERCOSTAL MUSCLE FLAP (Right) Subjective: Confused and restless. Did not sleep again last night and required soft wrist restraints to prevent pulling at lines / tubes.   Objective: Vital signs in last 24 hours: Temp:  [98.1 F (36.7 C)-98.2 F (36.8 C)] 98.1 F (36.7 C) (11/30 2339) Pulse Rate:  [99-104] 99 (11/30 1932) Cardiac Rhythm: Sinus tachycardia (12/01 0401) Resp:  [17-21] 17 (11/30 1932) BP: (125-139)/(97-115) 133/97 (11/30 1932) SpO2:  [97 %-98 %] 98 % (11/30 1932) Weight:  [64.4 kg] 64.4 kg (12/01 0635)     Intake/Output from previous day: 11/30 0701 - 12/01 0700 In: 606.4 [I.V.:206.4; IV Piggyback:400] Out: 1054 [Urine:650; Drains:350; Chest Tube:54] Intake/Output this shift: No intake/output data recorded.  Physical Exam: General appearance: alert and confused.Wrists are in soft restraints.  Neurologic: Moves all extremities, confused. Heart: SR. Lungs: clear to auscultation bilaterally. CXR shows new subcutaneous air in right lateral chest wall and axilla. Chest tubes in stable position. No air leak and minimal drainage from PleurEvac.  Abdomen: Soft, no apparent tenderness. Few bowel sounds. The PEG-J tube is sucured and connected to a straight drain with 343ml output past 24 hours.  Exit site is dry.  Extremities: All well perfused, no edema. SCD's in place.  Wound: the right chest tubes are secure.  Right thoracotomy dressing is dry.    Lab Results: Recent Labs    09/09/19 1138 09/11/19 0413  WBC 9.7 7.8  HGB 10.0* 9.9*  HCT 33.6* 32.8*  PLT 299 290   BMET:  Recent Labs    09/10/19 0401 09/11/19 0413  NA 145 146*  K 3.0* 3.1*  CL 106 109  CO2 25 23  GLUCOSE 99 95  BUN 10 11  CREATININE 0.78 0.96  CALCIUM 8.6* 9.0    PT/INR: No results for input(s): LABPROT, INR in the last 72 hours. ABG    Component Value Date/Time   PHART 7.426 09/04/2019 1241   HCO3 26.5 09/04/2019 1241   TCO2 28 09/04/2019 1241   ACIDBASEDEF 6.0 (H) 09/04/2019 0522   O2SAT 99.0 09/04/2019 1241   CBG (last 3)  Recent Labs    09/10/19 2006 09/11/19 0128 09/11/19 0412  GLUCAP 97 106* 96    Assessment/Plan: S/P Procedure(s) (LRB): THORACOTOMY MAJOR FOR ESOPHAGEAL PERFORATION WITH INTERCOSTAL MUSCLE FLAP (Right)  -POD-8 right thoracotomy, repair of esophogeal perforation with intercostal muscle flap. Afebrile and  WBC remains normal. Vanc and Zosyn have been discontinued, on day 5 IV fluconazole for candida cultured from pleural fluid at time of surgery.   -Nutrition- Previously placed PEG was converted to PEG-J tube early post-op and has reportedly stopped working.  Requested IR to replace the PEG-J tube and will then resume nutritional support.   -EtOH abuse-on detox protocol / Precedex early post-op. Now on Ativan po q6h and reasonably calm on morning rounds. Not sleeping at night per RN's report, QHS Seroquel started last night.    -Mild anemia- Hct. Stable, continue to monitor.   -Hypokalemia-Replaced with IV supplement yesterday, K+ 3.1. Will repeat today.  Monitor.   -DVT PPX- continue [place] Lovenox.    LOS: 8 days    [name], PA-C [phone] 09/11/2019

## 2019-09-11 NOTE — TOC Progression Note (Signed)
Transition of Care (TOC) - Progression Note    Patient Details  Name: George Wade. MRN: [id number] Date of Birth: [birth year]  [facility] of [facility]) CM/SW Contact  [name], RN Phone Number: 09/11/2019, 4:44 PM  Clinical Narrative:    Patient transferred from [place], confusion, in soft restraints and mittens.  IR to place peg tube.  TOC team will cont to follow for TOC needs.         Expected Discharge Plan and Services                                                 Social Determinants of Health (SDOH) Interventions    Readmission Risk Interventions No flowsheet data found.

## 2019-09-11 NOTE — Plan of Care (Signed)
  Problem: Education: Goal: Knowledge of General Education information will improve Description: Including pain rating scale, medication(s)/side effects and non-pharmacologic comfort measures 09/11/2019 0147 by Brand Males, RN Outcome: Progressing 09/11/2019 0147 by Brand Males, RN Outcome: Progressing   Problem: Health Behavior/Discharge Planning: Goal: Ability to manage health-related needs will improve 09/11/2019 0147 by Brand Males, RN Outcome: Progressing 09/11/2019 0147 by Brand Males, RN Outcome: Progressing   Problem: Clinical Measurements: Goal: Ability to maintain clinical measurements within normal limits will improve 09/11/2019 0147 by Brand Males, RN Outcome: Progressing 09/11/2019 0147 by Brand Males, RN Outcome: Progressing Goal: Will remain free from infection 09/11/2019 0147 by Brand Males, RN Outcome: Progressing 09/11/2019 0147 by Brand Males, RN Outcome: Progressing Goal: Diagnostic test results will improve 09/11/2019 0147 by Brand Males, RN Outcome: Progressing 09/11/2019 0147 by Brand Males, RN Outcome: Progressing Goal: Respiratory complications will improve 09/11/2019 0147 by Brand Males, RN Outcome: Progressing 09/11/2019 0147 by Brand Males, RN Outcome: Progressing Goal: Cardiovascular complication will be avoided 09/11/2019 0147 by Brand Males, RN Outcome: Progressing 09/11/2019 0147 by Brand Males, RN Outcome: Progressing   Problem: Activity: Goal: Risk for activity intolerance will decrease 09/11/2019 0147 by Brand Males, RN Outcome: Progressing 09/11/2019 0147 by Brand Males, RN Outcome: Progressing   Problem: Nutrition: Goal: Adequate nutrition will be maintained 09/11/2019 0147 by Brand Males, RN Outcome: Progressing 09/11/2019 0147 by Brand Males, RN Outcome: Progressing   Problem: Elimination: Goal: Will not experience complications related to bowel  motility 09/11/2019 0147 by Brand Males, RN Outcome: Progressing 09/11/2019 0147 by Brand Males, RN Outcome: Progressing Goal: Will not experience complications related to urinary retention 09/11/2019 0147 by Brand Males, RN Outcome: Progressing 09/11/2019 0147 by Brand Males, RN Outcome: Progressing   Problem: Pain Managment: Goal: General experience of comfort will improve 09/11/2019 0147 by Brand Males, RN Outcome: Progressing 09/11/2019 0147 by Brand Males, RN Outcome: Progressing   Problem: Safety: Goal: Ability to remain free from injury will improve 09/11/2019 0147 by Brand Males, RN Outcome: Progressing 09/11/2019 0147 by Brand Males, RN Outcome: Progressing   Problem: Skin Integrity: Goal: Risk for impaired skin integrity will decrease 09/11/2019 0147 by Brand Males, RN Outcome: Progressing 09/11/2019 0147 by Brand Males, RN Outcome: Progressing   Problem: Bowel/Gastric: Goal: Gastrointestinal status for postoperative course will improve Outcome: Progressing   Problem: Nutritional: Goal: Ability to achieve adequate nutritional intake will improve Outcome: Progressing   Problem: Clinical Measurements: Goal: Postoperative complications will be avoided or minimized Outcome: Progressing   Problem: Respiratory: Goal: Ability to maintain a clear airway will improve Outcome: Progressing   Problem: Skin Integrity: Goal: Demonstration of wound healing without infection will improve Outcome: Progressing

## 2019-09-11 NOTE — Progress Notes (Signed)
Placed pt in soft wrist restraints for safety due to pt impulsive behavior, confused nature and pulling of lines. On-call MD made aware. Verbal order obtained. Will continue to monitor.  Tressie Ellis, RN

## 2019-09-12 ENCOUNTER — Inpatient Hospital Stay (HOSPITAL_COMMUNITY): Payer: Self-pay

## 2019-09-12 LAB — COMPREHENSIVE METABOLIC PANEL
ALT: 10 U/L (ref 0–44)
AST: 12 U/L — ABNORMAL LOW (ref 15–41)
Albumin: 2.3 g/dL — ABNORMAL LOW (ref 3.5–5.0)
Alkaline Phosphatase: 82 U/L (ref 38–126)
Anion gap: 8 (ref 5–15)
BUN: 12 mg/dL (ref 8–23)
CO2: 27 mmol/L (ref 22–32)
Calcium: 8.9 mg/dL (ref 8.9–10.3)
Chloride: 115 mmol/L — ABNORMAL HIGH (ref 98–111)
Creatinine, Ser: 0.65 mg/dL (ref 0.61–1.24)
GFR calc Af Amer: >60 mL/min (ref >60)
GFR calc non Af Amer: >60 mL/min (ref >60)
Glucose, Bld: 129 mg/dL — ABNORMAL HIGH (ref 70–99)
Potassium: 3.2 mmol/L — ABNORMAL LOW (ref 3.5–5.1)
Sodium: 150 mmol/L — ABNORMAL HIGH (ref 135–145)
Total Bilirubin: 0.7 mg/dL (ref 0.3–1.2)
Total Protein: 5.2 g/dL — ABNORMAL LOW (ref 6.5–8.1)

## 2019-09-12 LAB — CBC
HCT: 31.6 % — ABNORMAL LOW (ref 39.0–52.0)
Hemoglobin: 9.7 g/dL — ABNORMAL LOW (ref 13.0–17.0)
MCH: 26.8 pg (ref 26.0–34.0)
MCHC: 30.7 g/dL (ref 30.0–36.0)
MCV: 87.3 fL (ref 80.0–100.0)
Platelets: 297 10*3/uL (ref 150–400)
RBC: 3.62 MIL/uL — ABNORMAL LOW (ref 4.22–5.81)
RDW: 19.7 % — ABNORMAL HIGH (ref 11.5–15.5)
WBC: 8.8 10*3/uL (ref 4.0–10.5)
nRBC: 0 % (ref 0.0–0.2)

## 2019-09-12 LAB — GLUCOSE, CAPILLARY
Glucose-Capillary: 123 mg/dL — ABNORMAL HIGH (ref 70–99)
Glucose-Capillary: 168 mg/dL — ABNORMAL HIGH (ref 70–99)
Glucose-Capillary: 123 mg/dL — ABNORMAL HIGH (ref 70–99)
Glucose-Capillary: 136 mg/dL — ABNORMAL HIGH (ref 70–99)
Glucose-Capillary: 119 mg/dL — ABNORMAL HIGH (ref 70–99)

## 2019-09-12 LAB — BASIC METABOLIC PANEL
Anion gap: 7 (ref 5–15)
BUN: 13 mg/dL (ref 8–23)
CO2: 25 mmol/L (ref 22–32)
Calcium: 9 mg/dL (ref 8.9–10.3)
Chloride: 114 mmol/L — ABNORMAL HIGH (ref 98–111)
Creatinine, Ser: 0.71 mg/dL (ref 0.61–1.24)
GFR calc Af Amer: >60 mL/min (ref >60)
GFR calc non Af Amer: >60 mL/min (ref >60)
Glucose, Bld: 112 mg/dL — ABNORMAL HIGH (ref 70–99)
Potassium: 3.2 mmol/L — ABNORMAL LOW (ref 3.5–5.1)
Sodium: 146 mmol/L — ABNORMAL HIGH (ref 135–145)

## 2019-09-12 LAB — MRSA PCR SCREENING: MRSA by PCR: NEGATIVE

## 2019-09-12 MED ORDER — SODIUM CHLORIDE 0.9 % IV SOLN
12.5000 mg | Freq: Three times a day (TID) | INTRAVENOUS | Status: AC
  Administered 2019-09-12 (×3): 12.5 mg via INTRAVENOUS
  Filled 2019-09-12 (×3): qty 0.5

## 2019-09-12 MED ORDER — PANTOPRAZOLE SODIUM 40 MG IV SOLR
40.0000 mg | INTRAVENOUS | Status: DC
  Administered 2019-09-12 – 2019-09-20 (×9): 40 mg via INTRAVENOUS
  Filled 2019-09-12 (×9): qty 40

## 2019-09-12 MED ORDER — POTASSIUM CHLORIDE 20 MEQ/15ML (10%) PO SOLN
20.0000 meq | Freq: Three times a day (TID) | ORAL | Status: AC
  Administered 2019-09-12 (×3): 20 meq
  Filled 2019-09-12 (×3): qty 15

## 2019-09-12 NOTE — Progress Notes (Signed)
Nutrition Follow-up  DOCUMENTATION CODES:   Non-severe (moderate) malnutrition in context of social or environmental circumstances  INTERVENTION:   Continue tube feeding: - Osmolite 1.5 @ 60 ml/hr (1440 ml/day) via J-tube - Pro-stat 30 ml BID  Tube feeding regimen provides 2360 kcal, 120 grams of protein, and 1097 ml of H2O.   NUTRITION DIAGNOSIS:   Moderate Malnutrition related to social / environmental circumstances (homeless, ETOH abuse) as evidenced by moderate muscle depletion, moderate fat depletion, mild fat depletion.  Ongoing, being addressed via TF  GOAL:   Patient will meet greater than or equal to 90% of their needs  Met via TF  MONITOR:   Weight trends, Labs, I & O's, TF tolerance, Skin  REASON FOR ASSESSMENT:   Malnutrition Screening Tool    ASSESSMENT:   Patient with PMH significant for ETOH abuse, non-hodgkin's lymphoma, tonsillar cancer, esophogeal stricture s/p PEG, and HTN. Presents this admission with esophageal perforation.  11/24 - right thoracotomy, repair of perforation, extubated 11/25 - G-tube converted to G-J tube 11/29 - pt removed NG tube, G-J tube clogged 12/01 - G-J tube unclogged, swallow study showing residual distal esophageal stricture and no leak of contrast  Noted pt's weight is down 28 lbs since 11/24. Suspect some of this weight loss is related to fluid status but possibly also related to dry weight loss. Will continue to monitor trends.  Spoke with RN who reports pt is tolerating TF at goal without issue. RN reports feeding tube flushes slowly but no issues with TF infusing.  Spoke with pt at bedside. Pt reports his stomach "hurts like the devil." Unable to obtain further details.  Current TF: Osmolite 1.5 @ 60 ml/hr, Pro-stat 30 ml BID  Medications reviewed and include: folic acid, SSI q 4 hours, Protonix, KCl 20 mEq TID, thiamine IVF: D5 in 1/2NS @ 50 ml/hr  Labs reviewed: sodium 146, potassium 3.2, hemoglobin  9.7 CBG's: 123-174 x 24 hours  CT: 96 ml x 24 hours  Diet Order:   Diet Order            Diet NPO time specified  Diet effective now              EDUCATION NEEDS:   Not appropriate for education at this time  Skin:  Skin Assessment: Skin Integrity Issues: Skin Integrity Issues: Incisions: R chest  Last BM:  09/11/19 type 6  Height:   Ht Readings from Last 1 Encounters:  09/04/19 5' 8"  (1.727 m)    Weight:   Wt Readings from Last 1 Encounters:  09/12/19 64.4 kg    Ideal Body Weight:  70 kg  BMI:  Body mass index is 21.59 kg/m.  Estimated Nutritional Needs:   Kcal:  2200-2400 kcal  Protein:  110-125 grams  Fluid:  >/= 2.2 L/day    Gaynell Face, MS, RD, LDN Inpatient Clinical Dietitian Pager: [phone] Weekend/After Hours: [phone]

## 2019-09-12 NOTE — Plan of Care (Signed)
  Problem: Education: Goal: Knowledge of General Education information will improve Description: Including pain rating scale, medication(s)/side effects and non-pharmacologic comfort measures Outcome: Not Progressing   Problem: Health Behavior/Discharge Planning: Goal: Ability to manage health-related needs will improve Outcome: Not Progressing   Problem: Clinical Measurements: Goal: Ability to maintain clinical measurements within normal limits will improve Outcome: Progressing Goal: Will remain free from infection Outcome: Progressing Goal: Diagnostic test results will improve Outcome: Progressing Goal: Respiratory complications will improve Outcome: Progressing Goal: Cardiovascular complication will be avoided Outcome: Progressing   Problem: Activity: Goal: Risk for activity intolerance will decrease Outcome: Progressing   Problem: Nutrition: Goal: Adequate nutrition will be maintained Outcome: Progressing   Problem: Coping: Goal: Level of anxiety will decrease Outcome: Not Progressing   Problem: Elimination: Goal: Will not experience complications related to bowel motility Outcome: Progressing Goal: Will not experience complications related to urinary retention Outcome: Progressing   Problem: Pain Managment: Goal: General experience of comfort will improve Outcome: Progressing   Problem: Safety: Goal: Ability to remain free from injury will improve Outcome: Progressing   Problem: Skin Integrity: Goal: Risk for impaired skin integrity will decrease Outcome: Progressing   Problem: Bowel/Gastric: Goal: Gastrointestinal status for postoperative course will improve Outcome: Progressing   Problem: Nutritional: Goal: Ability to achieve adequate nutritional intake will improve Outcome: Progressing   Problem: Clinical Measurements: Goal: Postoperative complications will be avoided or minimized Outcome: Progressing   Problem: Respiratory: Goal: Ability to  maintain a clear airway will improve Outcome: Progressing   Problem: Skin Integrity: Goal: Demonstration of wound healing without infection will improve Outcome: Progressing

## 2019-09-12 NOTE — Op Note (Signed)
NAME: George Wade, George Wade MEDICAL RECORD RC:[id number] ACCOUNT [id number] DATE OF BIRTH:1956-06-27 FACILITY: [facility] LOCATION: [facility] PHYSICIAN:STEVEN Chaya Jan, MD  OPERATIVE REPORT  DATE OF PROCEDURE:  09/04/2019  PREOPERATIVE DIAGNOSIS:  Boerhaave's syndrome with esophageal rupture.  POSTOPERATIVE DIAGNOSIS:  Boerhaave's syndrome with esophageal rupture.  PROCEDURES:   1.  Right thoracotomy.  2.  Primary repair of esophageal perforation with intercostal muscle flap reinforcement.  SURGEON:  Modesto Charon, MD  ASSISTANT:  Enid Cutter, PA  ANESTHESIA:  General.  FINDINGS:  Approximately 2.5 L of murky fluid in the right pleural space.  Minimal necrotic tissue.  Rupture of the esophagus.  Unable to pass NG beyond the midesophagus.  CLINICAL NOTE:  The patient is a 63 year old man with a history of ethanol abuse and esophageal stricture who presented with right-sided chest pain.  Workup showed a right hydropneumothorax with extensive distal pneumomediastinum.  His white blood cell  count was 31,000 and lactic acid level was 10.  He was started on IV antibiotics and fluid resuscitated and transferred to [facility] for further management.  He was taken to the operating room.  He was advised to undergo right thoracotomy for management  of esophageal rupture.  The indications, risks, benefits and alternatives were discussed in detail with the patient.  He understood and accepted the risks and agreed to proceed.  OPERATIVE NOTE:  George Wade was brought to the preoperative holding area where he was further resuscitated.  Central venous access was obtained.  Intravenous antibiotics had already been administered while in the emergency room.  An arterial  blood pressure monitoring line was placed.  He was taken to the operating room, anesthetized and intubated with a double lumen endotracheal tube.  His G-tube was placed to straight drainage.  He was placed in a left lateral  decubitus position and the  right chest was prepped and draped in the usual sterile fashion.  Single-lung ventilation of the left lung was initiated and was tolerated well throughout the procedure.  A timeout was performed.  An incision was made in the 6th intercostal space posterolaterally.  The latissimus muscle was divided.  The serratus muscle was preserved.  The chest was entered and approximately 2.5 L of grayish, murky fluid was evacuated  from the chest.  This had a foul smell.  It was sent for cultures.  The 6th intercostal muscle was taken down and prepared as an intercostal muscle flap.  The 6th rib was divided to improve exposure.  Once the retractor was in place, the lung was  retracted.  There was good isolation of the right lung and there was a rupture apparent in the esophagus near the inferior pulmonary ligament.  The pleura overlying this tear was opened proximally and distally.  An attempt was made to pass an NG tube,  but the tube would not pass the midesophagus to the site of the rupture.  Necrotic tissue was debrided and the mucosal tear was identified.  Decision was made to proceed with primary repair with intercostal muscle flap reinforcement.  A 2-layer repair  was performed, the first layer being a 3-0 PDS suture, followed by interrupted 3-0 silk sutures.  The intercostal muscle flap then was tacked over the repair circumferentially with interrupted 3-0 silk sutures.  The chest was copiously irrigated with  multiple liters of warm saline.  Three Blake drains, 19-French and two 28-French drains were placed through separate incisions.  One was placed posteriorly along the paravertebral gutter.  The 19-French drain  was placed along the diaphragm and a second  28-French too was placed laterally to the apex.  All were secured with #1 silk sutures.  The ribs were reapproximated with #2 Vicryl sutures.  Dual-lung ventilation was resumed.  The incision was closed in standard fashion.  The  chest tubes were placed  to suction.  The patient was placed back in a supine position.  The double lumen endotracheal tube was changed for a single lumen tube and the patient then was taken from the operating room to the Surgical Intensive Care Unit, intubated and in critical  but stable condition.  VN/NUANCE  D:09/12/2019 T:09/12/2019 JOB:[id number]

## 2019-09-12 NOTE — Progress Notes (Addendum)
9 Days Post-Op Procedure(s) (LRB): THORACOTOMY MAJOR FOR ESOPHAGEAL PERFORATION WITH INTERCOSTAL MUSCLE FLAP (Right) Subjective: Awake and cooperative but talking incoherently.  Did not sleep again last night.  No new problems reported except he has had hiccups for the past 2 days.   TF infusing at 84ml/hr per J-tube.    Objective: Vital signs in last 24 hours: Temp:  [97.8 F (36.6 C)-98.5 F (36.9 C)] 98.5 F (36.9 C) (12/01 2000) Pulse Rate:  [102-114] 107 (12/02 0500) Cardiac Rhythm: Sinus tachycardia (12/02 0500) Resp:  [20-26] 26 (12/02 0500) BP: (109-130)/(81-99) 109/89 (12/02 0400) SpO2:  [95 %-100 %] 95 % (12/02 0500) Weight:  [64.4 kg] 64.4 kg (12/02 0500)  Intake/Output from previous day: 12/01 0701 - 12/02 0700 In: 950.6 [I.V.:750.6; IV Piggyback:200] Out: 321 [Drains:225; Chest Tube:96] Intake/Output this shift: No intake/output data recorded.   Physical Exam: General appearance:alert andconfused.Wrists are in soft restraints. Neurologic:Moves all extremities, confused but cooperative. Heart:ST. Lungs:clear to auscultation bilaterally. Right chest wall and right shoulder subQ air is palpable but has not increased since yesterday.  CXR shows stable subcutaneous air in right lateral chest wall and axilla. The right PTX remains stable at less than 1cm.  Chest tubes in stable position. No air leak and minimal drainage from PleurEvac.  Abdomen:Soft, no apparent tenderness. Few bowel sounds. The PEG-J tube is sucured and connected to a straight drain with 324ml output past 24 hours.  Exit site is dry. Extremities:All well perfused, no edema.  Wound:the right chest tubes are secure. Right thoracotomy incision is intact, clean, and dry.  Lab Results: Recent Labs    09/11/19 0413 09/12/19 0311  WBC 7.8 8.8  HGB 9.9* 9.7*  HCT 32.8* 31.6*  PLT 290 297   BMET:  Recent Labs    09/11/19 0413 09/12/19 0311  NA 146* 146*  K 3.1* 3.2*  CL 109 114*  CO2  23 25  GLUCOSE 95 112*  BUN 11 13  CREATININE 0.96 0.71  CALCIUM 9.0 9.0    PT/INR: No results for input(s): LABPROT, INR in the last 72 hours. ABG    Component Value Date/Time   PHART 7.426 09/04/2019 1241   HCO3 26.5 09/04/2019 1241   TCO2 28 09/04/2019 1241   ACIDBASEDEF 6.0 (H) 09/04/2019 0522   O2SAT 99.0 09/04/2019 1241   CBG (last 3)  Recent Labs    09/11/19 2042 09/11/19 2336 09/12/19 0351  GLUCAP 140* 174* 123*   EXAM: ESOPHOGRAM/BARIUM SWALLOW  TECHNIQUE: Single contrast examination was performed using water-soluble contrast (100 mL Omnipaque 300).  FLUOROSCOPY TIME:  Fluoroscopy Time:  27 second  Number of Acquired Spot Images: 0  COMPARISON:  None.  FINDINGS: Examination was performed in the semi-supine position. There is no contrast extravasation to suggest leak. Severe stricture of the distal esophagus with distention of the more proximal esophagus. Contrast does pass through the stricture, but a small volume remains throughout the course of the study. Possible superimposed narrowing at the gastroesophageal junction.  IMPRESSION: No evidence of leak.  Severe stricture of the distal esophagus with proximal distention. Possible superimposed narrowing of the gastroesophageal junction   Electronically Signed   By: Macy Mis M.D.   On: 09/11/2019 15:17   Assessment/Plan: S/P Procedure(s) (LRB): THORACOTOMY MAJOR FOR ESOPHAGEAL PERFORATION WITH INTERCOSTAL MUSCLE FLAP (Right)  -POD-9 right thoracotomy, repair of esophogeal perforation with intercostal muscle flap. Afebrile and  WBC remains normal. Vanc and Zosyn have been discontinued after 7-day course.  On day 6 IV fluconazole for  candida cultured from pleural fluid at time of surgery. Results of swallow study performed yesterday noted.  Residual distal esophogeal stricture was expected. No leak of contrast.  Will attempt to mobilize today.   -Nutrition- Previously placed PEG was  converted to PEG-J tube early post-op and has reportedly stopped working. Tubogram by IR on 09/11/19 confirmed both lumens to patent. TF resumed and is infusing at goal rate with no sign of obstruction.    -EtOH abuse-on detox protocol / Precedex early post-op. Now on Ativan po q6h and reasonably calm on morning rounds.Still not sleeping at night.  QHS Seroquel started 11/30 at 50mg  and increased to 100mg  last night.  Repeat 100mg  dose tonight.  Minimize benzo's as much as possible.   -Mild anemia- Hct. Stable, continue to monitor.   -Hypokalemia-Replaced with IV supplement past 2 days,  K+ 3.2. Will give KCl liquid per J-tube today.   Monitor.   -DVT PPX- continue  Lovenox.  -Persistent hiccups- will try a small dose of thorazine per J-tube.    LOS: 9 days    [name], [place] [phone] 09/12/2019 Patient seen and examined, agree with above He still has confusion Reviewed swallow- there is a stricture proximal to the repair. There is no sign of a leak and contrast passes into stomach. Will allow him to have sips of clear liquids and follow CT output. It is still not clear what the etiology of the SQ emphysema seen on CXR is but likely came from the esophagus earlier on in his course. Will keep all drains in place and monitor for any change in output. Continue G to drainage, TF via Micah Flesher C. [name], MD [facility] [phone]

## 2019-09-13 ENCOUNTER — Inpatient Hospital Stay (HOSPITAL_COMMUNITY): Payer: Self-pay

## 2019-09-13 LAB — GLUCOSE, CAPILLARY
Glucose-Capillary: 117 mg/dL — ABNORMAL HIGH (ref 70–99)
Glucose-Capillary: 119 mg/dL — ABNORMAL HIGH (ref 70–99)
Glucose-Capillary: 135 mg/dL — ABNORMAL HIGH (ref 70–99)
Glucose-Capillary: 139 mg/dL — ABNORMAL HIGH (ref 70–99)
Glucose-Capillary: 146 mg/dL — ABNORMAL HIGH (ref 70–99)
Glucose-Capillary: 151 mg/dL — ABNORMAL HIGH (ref 70–99)
Glucose-Capillary: 99 mg/dL (ref 70–99)

## 2019-09-13 LAB — CBC
HCT: 30 % — ABNORMAL LOW (ref 39.0–52.0)
Hemoglobin: 8.9 g/dL — ABNORMAL LOW (ref 13.0–17.0)
MCH: 26.6 pg (ref 26.0–34.0)
MCHC: 29.7 g/dL — ABNORMAL LOW (ref 30.0–36.0)
MCV: 89.6 fL (ref 80.0–100.0)
Platelets: 292 K/uL (ref 150–400)
RBC: 3.35 MIL/uL — ABNORMAL LOW (ref 4.22–5.81)
RDW: 20 % — ABNORMAL HIGH (ref 11.5–15.5)
WBC: 8.8 K/uL (ref 4.0–10.5)
nRBC: 0 % (ref 0.0–0.2)

## 2019-09-13 MED ORDER — ENOXAPARIN SODIUM 80 MG/0.8ML ~~LOC~~ SOLN
1.0000 mg/kg | Freq: Two times a day (BID) | SUBCUTANEOUS | Status: DC
  Administered 2019-09-13 – 2019-09-25 (×25): 65 mg via SUBCUTANEOUS
  Filled 2019-09-13 (×12): qty 0.8
  Filled 2019-09-13: qty 0.65
  Filled 2019-09-13 (×7): qty 0.8
  Filled 2019-09-13 (×2): qty 0.65
  Filled 2019-09-13 (×4): qty 0.8

## 2019-09-13 MED ORDER — SODIUM CHLORIDE 0.9 % IV SOLN
25.0000 mg | Freq: Two times a day (BID) | INTRAVENOUS | Status: AC
  Administered 2019-09-13 – 2019-09-14 (×2): 25 mg via INTRAVENOUS
  Filled 2019-09-13 (×2): qty 1

## 2019-09-13 MED ORDER — BOOST / RESOURCE BREEZE PO LIQD CUSTOM
1.0000 | Freq: Two times a day (BID) | ORAL | Status: DC
  Administered 2019-09-13 – 2019-09-14 (×2): 1 via ORAL

## 2019-09-13 MED ORDER — ACETAMINOPHEN 160 MG/5ML PO SOLN
650.0000 mg | Freq: Four times a day (QID) | ORAL | Status: DC
  Administered 2019-09-13 – 2019-11-03 (×167): 650 mg
  Filled 2019-09-13 (×170): qty 20.3

## 2019-09-13 NOTE — Progress Notes (Signed)
Nutrition Follow-up  DOCUMENTATION CODES:   Non-severe (moderate) malnutrition in context of social or environmental circumstances  INTERVENTION:   Recommend continuing with current tube feeding regimen to meet 100% of pt's needs until pt is able to tolerate a diet of solid foods.  Continue tube feeding: - Osmolite 1.5 @ 60 ml/hr (1440 ml/day) via J-tube - Pro-stat 30 ml BID  Tube feeding regimen provides 2360 kcal, 120 grams of protein, and 1097 ml of H2O.  - Boost Breeze po BID, each supplement provides 250 kcal and 9 grams of protein  NUTRITION DIAGNOSIS:   Moderate Malnutrition related to social / environmental circumstances (homeless, ETOH abuse) as evidenced by moderate muscle depletion, moderate fat depletion, mild fat depletion.  Ongoing, being addressed via TF  GOAL:   Patient will meet greater than or equal to 90% of their needs  Met via TF  MONITOR:   Weight trends, Labs, I & O's, TF tolerance, Skin  REASON FOR ASSESSMENT:   Malnutrition Screening Tool    ASSESSMENT:   Patient with PMH significant for ETOH abuse, non-hodgkin's lymphoma, tonsillar cancer, esophogeal stricture s/p PEG, and HTN. Presents this admission with esophageal perforation.  11/24 - right thoracotomy, repair of perforation, extubated 11/25 - G-tube converted to G-J tube 11/29 - pt removed NG tube, G-J tube clogged 12/01 - G-J tube unclogged, swallow study showing residual distal esophageal stricture and no leak of contrast 12/02 - diet advanced to clear liquids  Per CTS note, will try intermittent clamping of G-tube, continue J-tube feedings.  Weight up 4 lbs since 09/11/19. Will continue to monitor trends. Per RN edema assessment, pt with mild pitting edema to LUE and LLE.  Meal Completion: 30%, 40% x 2 clear liquid meal trays  Current TF: Osmolite 1.5 @ 60 ml/hr, Pro-stat 30 ml BID  Medications reviewed and include: folic acid, SSI q 4 hours, Protonix, thiamine  Labs  reviewed: hemoglobin 8.3 CBG's: 99-151 x 24 hours  UOP: 600 ml x 24 hours G-tube: 300 ml x 24 hours CT: 58 ml x 24 hours  Diet Order:   Diet Order            Diet clear liquid Room service appropriate? Yes; Fluid consistency: Thin  Diet effective now              EDUCATION NEEDS:   Not appropriate for education at this time  Skin:  Skin Assessment: Skin Integrity Issues: Skin Integrity Issues: Incisions: R chest  Last BM:  09/13/19  Height:   Ht Readings from Last 1 Encounters:  09/04/19 5' 8"  (1.727 m)    Weight:   Wt Readings from Last 1 Encounters:  09/13/19 66 kg    Ideal Body Weight:  70 kg  BMI:  Body mass index is 22.12 kg/m.  Estimated Nutritional Needs:   Kcal:  2200-2400 kcal  Protein:  110-125 grams  Fluid:  >/= 2.2 L/day    Gaynell Face, MS, RD, LDN Inpatient Clinical Dietitian Pager: [phone] Weekend/After Hours: [phone]

## 2019-09-13 NOTE — Progress Notes (Addendum)
10 Days Post-Op Procedure(s) (LRB): THORACOTOMY MAJOR FOR ESOPHAGEAL PERFORATION WITH INTERCOSTAL MUSCLE FLAP (Right) Subjective: Awake, calm, and more lucid this morning. Slept some last night.  Had a small amount of clear liquids PO yesterday but had coughing while attempting to swallow.  BM x 2 yesterday Objective: Vital signs in last 24 hours: Temp:  [98.1 F (36.7 C)-99 F (37.2 C)] 98.7 F (37.1 C) (12/03 0728) Pulse Rate:  [68-124] 103 (12/03 0728) Cardiac Rhythm: Sinus tachycardia (12/03 0728) Resp:  [13-29] 20 (12/03 0728) BP: (80-130)/(55-98) 94/79 (12/03 0728) SpO2:  [76 %-100 %] 99 % (12/03 0728) Weight:  [66 kg] 66 kg (12/03 0333)    Intake/Output from previous day: 12/02 0701 - 12/03 0700 In: 2574.8 [P.O.:5; I.V.:1034.8; NG/GT:1260; IV Piggyback:275] Out: 960 [Urine:600; Drains:300; Chest Tube:60] Intake/Output this shift: Total I/O In: 379.4 [I.V.:172.4; NG/GT:207] Out: -   Physical Exam: General appearance:alert, calm, and cooperative. .No longer using restraints.  Neurologic:Moves all extremities, cooperative and mental status more appropriate.  Lungs:clear to auscultation bilaterally. Right chest wall and right shoulder subQ air is palpable but has not increased on exam over past 2 days.   CXR shows stable subcutaneous air in right lateral chest wall and axilla. The right PTX remains stable at less than 1cm.  Chest tubes in stable position. No air leak and minimal drainage from PleurEvac. Abdomen:Soft, no apparent tenderness. Few bowel sounds. The PEG-J tube is sucured and connected to a straight drainwith 345ml output past 24 hours.Exit site is dry. Extremities:All well perfused, no edema.  Wound:the right chest tubes are secure. Right thoracotomy incision is intact, clean, and dry.  Lab Results: Recent Labs    09/12/19 0311 09/13/19 0340  WBC 8.8 8.8  HGB 9.7* 8.9*  HCT 31.6* 30.0*  PLT 297 292   BMET:  Recent Labs    09/12/19 0311  09/12/19 1122  NA 146* 150*  K 3.2* 3.2*  CL 114* 115*  CO2 25 27  GLUCOSE 112* 129*  BUN 13 12  CREATININE 0.71 0.65  CALCIUM 9.0 8.9    PT/INR: No results for input(s): LABPROT, INR in the last 72 hours. ABG    Component Value Date/Time   PHART 7.426 09/04/2019 1241   HCO3 26.5 09/04/2019 1241   TCO2 28 09/04/2019 1241   ACIDBASEDEF 6.0 (H) 09/04/2019 0522   O2SAT 99.0 09/04/2019 1241   CBG (last 3)  Recent Labs    09/12/19 2357 09/13/19 0338 09/13/19 0726  GLUCAP 135* 151* 139*    Assessment/Plan: S/P Procedure(s) (LRB): THORACOTOMY MAJOR FOR ESOPHAGEAL PERFORATION WITH INTERCOSTAL MUSCLE FLAP (Right)  -POD-10right thoracotomy, repair of esophogeal perforation with intercostal muscle flap. Afebrile and WBC remainsnormal. Vanc and Zosyn have been discontinued after 7-day course.  On day 7 IV fluconazole for candida cultured from pleural fluid at time of surgery. Continue to offer clear liquids while sitting up.  Request PT to eval and treat since he is now able to cooperate with therapy.  D/C central line.   -Nutrition- Previously placed PEG was converted to PEG-J tube early post-op and has reportedly stopped working.Tubogram by IR on 09/11/19 confirmed both lumens to patent. TF resumed and is infusing at goal rate with no sign of obstruction.  D/C IVF.  -EtOH abuse-on detox protocol / Precedex early post-op. Now on Ativan po q6h and reasonably calm on morning rounds.Still not sleeping at night. QHSSeroquelstarted 11/30 at 50mg  and increased to 100mg  last night.Repeat 100mg  dose tonight. Minimize benzo's as much as possible.   -  Mild anemia- Hct. Stable, continue to monitor.   -Hypokalemia-Replacement given over past 3 days. Repeat BMP in AM.   -DVT PPX- continue Bryce Lovenox.     LOS: 10 days    George Wade, [place] [phone] 09/13/2019 Patient seen and examined, agree with above. More alert and cooperative today Will try intermittent  clamping of G tube, continue J tube feedings Will ask case manager to see re: dc planning, although it is likely to be awhile before he is ready Given recent PE will change lovenox from prophylaxis dose to full dose 1 mg/kg Q 12 while CT are in place and can be monitored for bleeding, he should be safe from that standpoint at this point  [place]. [name], MD [facility] [phone]

## 2019-09-13 NOTE — Plan of Care (Signed)
  Problem: Education: Goal: Knowledge of General Education information will improve Description: Including pain rating scale, medication(s)/side effects and non-pharmacologic comfort measures Outcome: Progressing   Problem: Health Behavior/Discharge Planning: Goal: Ability to manage health-related needs will improve Outcome: Progressing   Problem: Clinical Measurements: Goal: Ability to maintain clinical measurements within normal limits will improve Outcome: Progressing Goal: Will remain free from infection Outcome: Progressing Goal: Diagnostic test results will improve Outcome: Progressing Goal: Respiratory complications will improve Outcome: Progressing Goal: Cardiovascular complication will be avoided Outcome: Progressing   Problem: Activity: Goal: Risk for activity intolerance will decrease Outcome: Progressing   Problem: Nutrition: Goal: Adequate nutrition will be maintained Outcome: Progressing   Problem: Coping: Goal: Level of anxiety will decrease Outcome: Progressing   Problem: Elimination: Goal: Will not experience complications related to bowel motility Outcome: Progressing Goal: Will not experience complications related to urinary retention Outcome: Progressing   Problem: Pain Managment: Goal: General experience of comfort will improve Outcome: Progressing   Problem: Safety: Goal: Ability to remain free from injury will improve Outcome: Progressing   Problem: Skin Integrity: Goal: Risk for impaired skin integrity will decrease Outcome: Progressing   Problem: Bowel/Gastric: Goal: Gastrointestinal status for postoperative course will improve Outcome: Progressing   Problem: Nutritional: Goal: Ability to achieve adequate nutritional intake will improve Outcome: Progressing   Problem: Clinical Measurements: Goal: Postoperative complications will be avoided or minimized Outcome: Progressing   Problem: Respiratory: Goal: Ability to maintain a  clear airway will improve Outcome: Progressing   Problem: Skin Integrity: Goal: Demonstration of wound healing without infection will improve Outcome: Progressing
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
## 2019-09-13 NOTE — Progress Notes (Signed)
Pharmacy Antibiotic Note  George Wade. is a 63 y.o. male admitted on 09/03/2019 with sepsis secondary to perforated/necrotic esophagus.  Pharmacy has been consulted for fluconazole. WBC WNL. Renal function function continues to be normal.   Growing candida albicans in pleural fluid/lung tissue.  Spoke to surgery at the time and vancomycin changed to fluconazole.  Plan: Fluconazole 400 mg IV daily, could treat up to 2 weeks depending on clinical improvement.   Temp (24hrs), Avg:98.6 F (37 C), Min:98.1 F (36.7 C), Max:99 F (37.2 C)  Recent Labs  Lab 09/08/19 0454 09/09/19 1138 09/10/19 0401 09/11/19 0413 09/12/19 0311 09/12/19 1122 09/13/19 0340  WBC 9.5 9.7  --  7.8 8.8  --  8.8  CREATININE 0.82  --  0.78 0.96 0.71 0.65  --     Estimated Creatinine Clearance: 88.2 mL/min (by C-G formula based on SCr of 0.65 mg/dL).    Allergies  Allergen Reactions  . Sulfa Antibiotics Nausea Only    Vanc 11/24>>11/27 Zosyn 11/23>>11/30 Fluconazole 11/27>>  11/24 Tissue R lung: rare candida albicans; rare viridans strep 11/23 BCx x 2 (from [facility]): ngF  Erin Hearing PharmD., BCPS Clinical Pharmacist 09/13/2019 10:00 AM

## 2019-09-14 ENCOUNTER — Inpatient Hospital Stay (HOSPITAL_COMMUNITY): Payer: Self-pay

## 2019-09-14 DIAGNOSIS — K223 Perforation of esophagus: Principal | ICD-10-CM

## 2019-09-14 DIAGNOSIS — A419 Sepsis, unspecified organism: Secondary | ICD-10-CM

## 2019-09-14 LAB — BASIC METABOLIC PANEL
Anion gap: 8 (ref 5–15)
BUN: 21 mg/dL (ref 8–23)
CO2: 24 mmol/L (ref 22–32)
Calcium: 8.5 mg/dL — ABNORMAL LOW (ref 8.9–10.3)
Chloride: 113 mmol/L — ABNORMAL HIGH (ref 98–111)
Creatinine, Ser: 0.71 mg/dL (ref 0.61–1.24)
GFR calc Af Amer: >60 mL/min (ref >60)
GFR calc non Af Amer: >60 mL/min (ref >60)
Glucose, Bld: 139 mg/dL — ABNORMAL HIGH (ref 70–99)
Potassium: 4.1 mmol/L (ref 3.5–5.1)
Sodium: 145 mmol/L (ref 135–145)

## 2019-09-14 LAB — GLUCOSE, CAPILLARY
Glucose-Capillary: 124 mg/dL — ABNORMAL HIGH (ref 70–99)
Glucose-Capillary: 139 mg/dL — ABNORMAL HIGH (ref 70–99)
Glucose-Capillary: 81 mg/dL (ref 70–99)
Glucose-Capillary: 103 mg/dL — ABNORMAL HIGH (ref 70–99)
Glucose-Capillary: 98 mg/dL (ref 70–99)

## 2019-09-14 LAB — CBC
HCT: 31.3 % — ABNORMAL LOW (ref 39.0–52.0)
Hemoglobin: 9.2 g/dL — ABNORMAL LOW (ref 13.0–17.0)
MCH: 26.6 pg (ref 26.0–34.0)
MCHC: 29.4 g/dL — ABNORMAL LOW (ref 30.0–36.0)
MCV: 90.5 fL (ref 80.0–100.0)
Platelets: 328 10*3/uL (ref 150–400)
RBC: 3.46 MIL/uL — ABNORMAL LOW (ref 4.22–5.81)
RDW: 20.5 % — ABNORMAL HIGH (ref 11.5–15.5)
WBC: 23.7 10*3/uL — ABNORMAL HIGH (ref 4.0–10.5)
nRBC: 0 % (ref 0.0–0.2)

## 2019-09-14 LAB — HIV ANTIBODY (ROUTINE TESTING W REFLEX): HIV Screen 4th Generation wRfx: NONREACTIVE

## 2019-09-14 LAB — BLOOD GAS, ARTERIAL
Acid-Base Excess: 0.6 mmol/L (ref 0.0–2.0)
Bicarbonate: 24.1 mmol/L (ref 20.0–28.0)
Drawn by: 53309
FIO2: 40
O2 Saturation: 95.4 %
Patient temperature: 38.6
pCO2 arterial: 37.5 mmHg (ref 32.0–48.0)
pH, Arterial: 7.432 (ref 7.350–7.450)
pO2, Arterial: 83.3 mmHg (ref 83.0–108.0)

## 2019-09-14 LAB — LACTIC ACID, PLASMA
Lactic Acid, Venous: 1.4 mmol/L (ref 0.5–1.9)
Lactic Acid, Venous: 1.4 mmol/L (ref 0.5–1.9)

## 2019-09-14 LAB — CORTISOL: Cortisol, Plasma: 14.1 ug/dL

## 2019-09-14 LAB — PROCALCITONIN: Procalcitonin: 0.49 ng/mL

## 2019-09-14 MED ORDER — IOHEXOL 300 MG/ML  SOLN
25.0000 mL | Freq: Once | INTRAMUSCULAR | Status: AC | PRN
  Administered 2019-09-14: 25 mL via ORAL

## 2019-09-14 MED ORDER — OXYCODONE HCL 5 MG/5ML PO SOLN: 5.0000 mg | ORAL | Status: DC | PRN

## 2019-09-14 MED ORDER — SODIUM CHLORIDE 0.9 % IV BOLUS
500.0000 mL | Freq: Once | INTRAVENOUS | Status: AC
  Administered 2019-09-14: 500 mL via INTRAVENOUS

## 2019-09-14 MED ORDER — LACTATED RINGERS IV SOLN: INTRAVENOUS | Status: DC

## 2019-09-14 MED ORDER — VANCOMYCIN HCL IN DEXTROSE 1-5 GM/200ML-% IV SOLN
1000.0000 mg | Freq: Two times a day (BID) | INTRAVENOUS | Status: DC
  Administered 2019-09-14 – 2019-09-20 (×13): 1000 mg via INTRAVENOUS
  Filled 2019-09-14 (×14): qty 200

## 2019-09-14 MED ORDER — LACTATED RINGERS IV BOLUS
1000.0000 mL | Freq: Once | INTRAVENOUS | Status: AC
  Administered 2019-09-14: 1000 mL via INTRAVENOUS

## 2019-09-14 MED ORDER — ACETAMINOPHEN 650 MG RE SUPP
650.0000 mg | RECTAL | Status: DC | PRN
  Administered 2019-09-14: 650 mg via RECTAL
  Filled 2019-09-14: qty 1

## 2019-09-14 MED ORDER — ACETYLCYSTEINE 20 % IN SOLN
2.0000 mL | Freq: Two times a day (BID) | RESPIRATORY_TRACT | Status: DC
  Administered 2019-09-14 – 2019-09-16 (×4): 2 mL via RESPIRATORY_TRACT
  Administered 2019-09-16: 20:00:00 via RESPIRATORY_TRACT
  Administered 2019-09-17 (×2): 2 mL via RESPIRATORY_TRACT
  Administered 2019-09-18: 4 mL via RESPIRATORY_TRACT
  Filled 2019-09-14 (×9): qty 4

## 2019-09-14 MED ORDER — ACETYLCYSTEINE 10 % IN SOLN
2.0000 mL | Freq: Two times a day (BID) | RESPIRATORY_TRACT | Status: DC
  Filled 2019-09-14 (×2): qty 2

## 2019-09-14 MED ORDER — GUAIFENESIN 100 MG/5ML PO SOLN
5.0000 mL | ORAL | Status: DC | PRN
  Administered 2019-09-15 – 2019-09-25 (×5): 100 mg via ORAL
  Filled 2019-09-14 (×5): qty 5

## 2019-09-14 MED ORDER — NOREPINEPHRINE 4 MG/250ML-% IV SOLN
2.0000 ug/min | INTRAVENOUS | Status: DC
  Administered 2019-09-14: 15:00:00 2 ug/min via INTRAVENOUS
  Filled 2019-09-14 (×2): qty 250

## 2019-09-14 MED ORDER — DEXTROSE-NACL 5-0.45 % IV SOLN
INTRAVENOUS | Status: DC
  Administered 2019-09-14 – 2019-09-15 (×4): via INTRAVENOUS
  Administered 2019-09-15: 1000 mL via INTRAVENOUS
  Administered 2019-09-16 – 2019-09-19 (×6): via INTRAVENOUS

## 2019-09-14 MED ORDER — SODIUM CHLORIDE 0.9 % IV BOLUS: 500.0000 mL | Freq: Once | INTRAVENOUS | Status: DC

## 2019-09-14 MED ORDER — PIPERACILLIN-TAZOBACTAM 3.375 G IVPB
3.3750 g | Freq: Three times a day (TID) | INTRAVENOUS | Status: DC
  Administered 2019-09-14 – 2019-09-21 (×21): 3.375 g via INTRAVENOUS
  Filled 2019-09-14 (×24): qty 50

## 2019-09-14 MED ORDER — VANCOMYCIN HCL 10 G IV SOLR
1250.0000 mg | Freq: Once | INTRAVENOUS | Status: AC
  Administered 2019-09-14: 1250 mg via INTRAVENOUS
  Filled 2019-09-14: qty 1250

## 2019-09-14 MED ORDER — SODIUM CHLORIDE 0.9 % IV BOLUS
250.0000 mL | Freq: Once | INTRAVENOUS | Status: AC
  Administered 2019-09-14: 250 mL via INTRAVENOUS

## 2019-09-14 NOTE — Progress Notes (Signed)
Paged respiratory regarding pt being tachypnic, HR 130's, resp 30's, unable to cough up thick sputum. Also paged Ellwood Handler PA, regarding changes. Neb tx given, NTS for large amount of thick whitish beige sputum. Increased oxygen to 5L/min [place].

## 2019-09-14 NOTE — Progress Notes (Signed)
Transfer to [facility]

## 2019-09-14 NOTE — Progress Notes (Signed)
Paged PA, blood sugar 81, has no tube feeding or IVF, new orders rec'd for IVF.

## 2019-09-14 NOTE — Progress Notes (Addendum)
Patient with history of ruptured esophagus status post right thoracotomy with repair 11/23.  His previously placed balloon retention gastrostomy tube was replaced with a gastrojejunal tube 09/05/2019 by Dr. Kathlene Cote.  IR notified this morning that G-tube was completely dislodged from the patient.  Plan was to replace GJ tube in IR today.  A Foley was placed to preserve the tract until patient could be accommodated in IR.  Unfortunately, it appears patient has decompensated this morning with deterioration of his medical status.  His blood pressure is in the 35L systolic with suspicion of sepsis.  Planning to move to ICU.  Discussed with [name], hold on Martell replacement given current instability.  Once patient is stable please reconsult for tube replacement.  Leave Foley in place for now.  Meds can be administered via foley if needed.  Brynda Greathouse, MS RD PA-C 3:53 PM

## 2019-09-14 NOTE — Progress Notes (Signed)
Pt down to radiology for esophogram and reinsertion of J/Gtube.

## 2019-09-14 NOTE — Progress Notes (Signed)
Pharmacy Antibiotic Note  George Wade. is a 63 y.o. male admitted on 09/03/2019 with sepsis, possible source from scrotal/anal ulcers. Already on fluconazole for pleural candidiasis. No esophageal leak after procedure. Pharmacy has been consulted for vanc/piptazo dosing.  12/4 vancomycin 1g q12hr Scr used: 0.8 Est AUC: 524  Plan: Vancomycin 1250mg  x1 then 1000mg  q12 hr Piptazo 3.375g Q 8 hr extended F/u renal fx, clinical status, cultures  Order levels as appropriate   Height: 5\' 8"  (172.7 cm) Weight: 145 lb 8.1 oz (66 kg) IBW/kg (Calculated) : 68.4  Temp (24hrs), Avg:99.7 F (37.6 C), Min:98.3 F (36.8 C), Max:101.3 F (38.5 C)  Recent Labs  Lab 09/09/19 1138 09/10/19 0401 09/11/19 0413 09/12/19 0311 09/12/19 1122 09/13/19 0340 09/14/19 0709  WBC 9.7  --  7.8 8.8  --  8.8 23.7*  CREATININE  --  0.78 0.96 0.71 0.65  --  0.71    Estimated Creatinine Clearance: 88.2 mL/min (by C-G formula based on SCr of 0.71 mg/dL).    Allergies  Allergen Reactions  . Sulfa Antibiotics Nausea Only    Antimicrobials this admission: Pip/tazo 11/23 >> 11/30; 12/4 >> Vanc 11/24 >> 11/28; 12/4 >> Fluconazole 11/27>>  Microbiology results: 12/4 BCx: pend  12/44 Sputum: pend  12/2 MRSA PCR: neg 11/24 Pleural: rare candida albicans  Thank you for allowing pharmacy to be a part of this patient's care.  Benetta Spar, PharmD, BCPS, BCCP Clinical Pharmacist  Please check AMION for all [place] phone numbers After 10:00 PM, call [place] [phone]

## 2019-09-14 NOTE — Progress Notes (Signed)
Paged Rapid response, busy right now with other emergency. Will come after done with that emergency
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
## 2019-09-14 NOTE — Progress Notes (Signed)
2040: Patient desat into mid to low 80's on room air. Patient previously at 96% on room air earlier in the shift.  Patient placed on 4L of [place] by nurse. Performed incentive spirometer, in addition to coughing and oral suctioning. Patient still not maintaining adequate sats.  Respiratory consulted at this time - RT placed pt on HFNC at 8 L. Pt O2 sats now in the 90's.  Chest tubes presented with no new air leak and known subq air has had no significant changes.   2046[name] paged.   2047[name] called back and updated on patient's status change and new oxygen requirements. Received orders for CXR and flutter valve.   2105: CXR completed - no changed indicated on the results.   2231[name] paged. Called MD regarding pt's increased heart rate - pt maintaining in the 130-140's. Received orders to given PRN metoprolol (5 mg). Discussed possibility of PE - however, pt has already received full dose anticoagulation therapy via lovenox injections.   2245: Patient HR now in the 100-110's. Pt still tachypneic and congested.  Continuing to monitor patient's BP and HR. Pt has been frequently repositioned and oral suctioned throughout shift.   2345: Patient found to have a low grade fever (100.5). Pt received scheduled tylenol at 0003.   0100: No improvement in fever - recent temperature check indicated 101.3.

## 2019-09-14 NOTE — NC FL2 (Signed)
[place] LEVEL OF CARE SCREENING TOOL     IDENTIFICATION  Patient Name: George Wade. Birthdate: [birth year] Sex: male Admission Date (Current Location): 09/03/2019  [facility] and [place] Number:  Engineering geologist and Address:  [facility], [place] [address], [place], [place] [address]      Provider Number: [id number]  Attending Physician Name and Address:  Melrose Nakayama, MD  Relative Name and Phone Number:  Chong Sicilian (sister) [phone]    Current Level of Care: Hospital Recommended Level of Care: [place] Prior Approval Number:    Date Approved/Denied:   PASRR Number: [id number]  Discharge Plan: SNF    Current Diagnoses: Patient Active Problem List   Diagnosis Date Noted  . 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 Esophageal perforation 09/04/2019  . Anal condylomata   . Rectal bleeding 08/20/2019  . Rectal mass 08/20/2019  . PEG (percutaneous endoscopic gastrostomy) status ([place])   . Aspiration into airway   . Fall 08/15/2019  . Elevated LFTs   . Esophageal stricture   . Problems with swallowing and mastication   . Stricture and stenosis of esophagus   . Malnutrition of moderate degree 07/31/2019  . Nausea & vomiting   . Pulmonary emboli ([place]) 07/28/2019  . Alcohol-induced mood disorder ([place]) 07/28/2019  . Alcohol dependence with uncomplicated intoxication ([place])   . Acute respiratory failure with hypoxia ([place]) 05/15/2019  . Wernicke's encephalopathy 04/15/2019  . GI bleed 03/31/2019  . Alcohol withdrawal ([place]) 12/09/2017    Orientation RESPIRATION BLADDER Height & Weight     Self, Time, Situation, Place  Other (Comment)(HFNC 3L) Continent, External catheter Weight: 145 lb 8.1 oz (66 kg) Height:  5\' 8"  (172.7 cm)  BEHAVIORAL SYMPTOMS/MOOD NEUROLOGICAL BOWEL NUTRITION STATUS      Incontinent Feeding tube  AMBULATORY STATUS COMMUNICATION OF NEEDS Skin   Limited Assist Verbally Other  (Comment)(ecchymosis arms, MASD buttocks, right chest closed surgical incision)                       Personal Care Assistance Level of Assistance  Bathing, Feeding, Dressing, Total care Bathing Assistance: Limited assistance Feeding assistance: Maximum assistance(feeding tube  continuous Osmolite 1.5 Cal) Dressing Assistance: Limited assistance Total Care Assistance: Limited assistance   Functional Limitations Info  Sight, Hearing, Speech Sight Info: Adequate Hearing Info: Adequate Speech Info: Adequate    SPECIAL CARE FACTORS FREQUENCY  PT (By licensed PT), OT (By licensed OT)     PT Frequency: min 5x weekly OT Frequency: min 5x weekly            Contractures Contractures Info: Not present    Additional Factors Info  Code Status, Allergies Code Status Info: full Allergies Info: Sulfa Antibiotics           Current Medications (09/14/2019):  This is the current hospital active medication list Current Facility-Administered Medications  Medication Dose Route Frequency Provider Last Rate Last Dose  . acetaminophen (TYLENOL) 160 MG/5ML solution 650 mg  650 mg Per Tube Q6H Roddenberry, Myron G, PA-C   650 mg at 09/14/19 0539  . chlorhexidine (PERIDEX) 0.12 % solution 15 mL  15 mL Mouth Rinse BID Melrose Nakayama, MD   15 mL at 09/14/19 7673  . Chlorhexidine Gluconate Cloth 2 % PADS 6 each  6 each Topical Daily Melrose Nakayama, MD   6 each at 09/13/19 1042  . enoxaparin (LOVENOX) injection 65 mg  1 mg/kg (Adjusted) Subcutaneous  Q12H Melrose Nakayama, MD   65 mg at 09/14/19 0809  . feeding supplement (BOOST / RESOURCE BREEZE) liquid 1 Container  1 Container Oral BID WC Melrose Nakayama, MD   1 Container at 09/14/19 [phone]  . feeding supplement (OSMOLITE 1.5 CAL) liquid 1,000 mL  1,000 mL Per Tube Continuous Melrose Nakayama, MD 60 mL/hr at 09/14/19 0415 1,000 mL at 09/14/19 0415  . feeding supplement (PRO-STAT SUGAR FREE 64) liquid 30 mL  30 mL Per  Tube BID Melrose Nakayama, MD   30 mL at 09/14/19 0809  . fluconazole (DIFLUCAN) IVPB 400 mg  400 mg Intravenous Q24H Carney, Gay Filler, RPH 100 mL/hr at 09/13/19 1507 400 mg at 09/13/19 1507  . folic acid injection 1 mg  1 mg Intravenous Daily Melrose Nakayama, MD   1 mg at 09/13/19 4174  . guaiFENesin (ROBITUSSIN) 100 MG/5ML solution 100 mg  5 mL Oral Q4H PRN Barrett, Erin R, PA-C      . hydrocortisone cream 1 %   Topical Q6H PRN Melrose Nakayama, MD      . HYDROmorphone (DILAUDID) injection 1-3 mg  1-3 mg Intravenous Q1H PRN Melrose Nakayama, MD   1 mg at 09/07/19 0923  . insulin aspart (novoLOG) injection 0-24 Units  0-24 Units Subcutaneous Q4H Melrose Nakayama, MD   2 Units at 09/14/19 0759  . ipratropium-albuterol (DUONEB) 0.5-2.5 (3) MG/3ML nebulizer solution 3 mL  3 mL Nebulization Q6H PRN Melrose Nakayama, MD      . LORazepam (ATIVAN) injection 0.5 mg  0.5 mg Intravenous Q6H PRN Melrose Nakayama, MD   0.5 mg at 09/11/19 2330  . MEDLINE mouth rinse  15 mL Mouth Rinse q12n4p Melrose Nakayama, MD   15 mL at 09/13/19 1515  . metoprolol tartrate (LOPRESSOR) injection 5 mg  5 mg Intravenous Q6H PRN Melrose Nakayama, MD   5 mg at 09/13/19 2239  . morphine 2 MG/ML injection 2 mg  2 mg Intravenous Q1H PRN Melrose Nakayama, MD   2 mg at 09/14/19 0746  . ondansetron (ZOFRAN) injection 4 mg  4 mg Intravenous Q6H PRN Melrose Nakayama, MD   4 mg at 09/08/19 1803  . oxyCODONE (ROXICODONE) 5 MG/5ML solution 5 mg  5 mg Oral Q3H PRN Barrett, Erin R, PA-C      . pantoprazole (PROTONIX) injection 40 mg  40 mg Intravenous Q24H Roddenberry, Myron G, PA-C   40 mg at 09/14/19 0806  . phenylephrine (NEOSYNEPHRINE) 10-0.9 MG/250ML-% infusion  0-400 mcg/min Intravenous Titrated Melrose Nakayama, MD   Stopped at 09/07/19 1346  . QUEtiapine (SEROQUEL) tablet 100 mg  100 mg Per J Tube QHS [name], PA-C   100 mg at 09/13/19 2127  . thiamine (B-1)  injection 100 mg  100 mg Intravenous Daily Melrose Nakayama, MD   100 mg at 09/14/19 0814     Discharge Medications: Please see discharge summary for a list of discharge medications.  Relevant Imaging Results:  Relevant Lab Results:   Additional Information SSN:[id number]  Alberteen Sam, LCSW

## 2019-09-14 NOTE — Progress Notes (Addendum)
[address]       [place] [address]             [phone]      11 Days Post-Op Procedure(s) (LRB): THORACOTOMY MAJOR FOR ESOPHAGEAL PERFORATION WITH INTERCOSTAL MUSCLE FLAP (Right)   Subjective:  Events of last night noted.  Patient is doing okay, doesn't have specific complaints.  Nurse mentioned the patient developed ulcerations below scrotum to anus.    Objective: Vital signs in last 24 hours: Temp:  [98.3 F (36.8 C)-101.3 F (38.5 C)] 98.4 F (36.9 C) (12/04 0728) Pulse Rate:  [104-137] 104 (12/04 0728) Cardiac Rhythm: Sinus tachycardia (12/04 0700) Resp:  [16-37] 26 (12/04 0728) BP: (91-122)/(57-88) 102/75 (12/04 0728) SpO2:  [89 %-100 %] 95 % (12/04 0728) Weight:  [66 kg] 66 kg (12/04 0419)  Intake/Output from previous day: 12/03 0701 - 12/04 0700 In: 2339.1 [P.O.:220; I.V.:307.1; NG/GT:1560; IV Piggyback:252] Out: 9798 [Urine:650; Drains:125; Chest Tube:128] Intake/Output this shift: Total I/O In: 118 [NG/GT:118] Out: 300 [Urine:200; Drains:100]  General appearance: alert, cooperative and no distress Heart: regular rate and rhythm Lungs: clear to auscultation bilaterally Abdomen: soft, non-tender; bowel sounds normal; no masses,  no organomegaly Extremities: extremities normal, atraumatic, no cyanosis or edema Wound: clean and dry Scrotal/anal region examined, there is a moderate to large collection of anal condyloma present  Lab Results: Recent Labs    09/13/19 0340 09/14/19 0709  WBC 8.8 23.7*  HGB 8.9* 9.2*  HCT 30.0* 31.3*  PLT 292 328   BMET:  Recent Labs    09/12/19 1122 09/14/19 0709  NA 150* 145  K 3.2* 4.1  CL 115* 113*  CO2 27 24  GLUCOSE 129* 139*  BUN 12 21  CREATININE 0.65 0.71  CALCIUM 8.9 8.5*    PT/INR: No results for input(s): LABPROT, INR in the last 72 hours. ABG    Component Value Date/Time   PHART 7.426 09/04/2019 1241   HCO3 26.5 09/04/2019 1241   TCO2 28 09/04/2019 1241   ACIDBASEDEF  6.0 (H) 09/04/2019 0522   O2SAT 99.0 09/04/2019 1241   CBG (last 3)  Recent Labs    09/13/19 1943 09/13/19 2352 09/14/19 0449  GLUCAP 119* 146* 124*    Assessment/Plan: S/P Procedure(s) (LRB): THORACOTOMY MAJOR FOR ESOPHAGEAL PERFORATION WITH INTERCOSTAL MUSCLE FLAP (Right)  1. CV- Sinus tachycardia this morning, likely due to fever and elevated white count will monitor 2. Pulm- no acute issues, CT is w/o air leak, 128 cc output yesterday, will add Robitussin to help clear sputum 3. GI- PEG tube in place, on tube feeds, on clear liquids when sitting up as able 4. ID- Febrile overnight, + leukocytosis, on Diflucan, broad spectrum ABX had completed.. patient has no acute evidence of wound infection, he is having thickened sputum will send for culture, get blood cultures, urinalysis.Marland Kitchen could this be related to anal condyloma... have also sent HIV test 5. ETOH abuse, on seroquel, benzos prn 6. Dispo- patient with new onset fever, leukocytosis, will workup to ensure patient is not septic, may benefit from broad spectrum ABX once cultures can be drawn, repeat CXR in AM   LOS: 11 days   Ellwood Handler, PA-C  09/14/2019 Patient seen and examined, agree with above He feels a little better this AM and is not in distress, but he is having some right chest pain. Will keep Gt to straight drainage, make NPO, repeat his swallow to look for a leak and resume broad spectrum antibiotics  He is back on full dose lovenox for recent PEs Continue TF  [name], MD [facility] [phone]

## 2019-09-14 NOTE — Progress Notes (Signed)
      [address]       [place] [address]             [phone]      Mr. Shough moved to ICU earlier today while I was tied up in surgery. He was tachycardic and hypotensive  Currently has some incisional pain, but is in no distress. He is not tachycardic and BP stable on 2 mcg/min of norepi  Swallow showed nothing getting by stricture  Was restarted on full dose anticoagulation yesterday and broad spectrum antibiotics this morning  Will have GJ replaced in AM  Mckade Gurka C. [name], MD [facility] [phone]

## 2019-09-14 NOTE — Progress Notes (Signed)
Paged PA temp 101.5 oral, new orders rec'd for rectal tylenol,

## 2019-09-14 NOTE — Progress Notes (Signed)
   09/14/19 1430  Vitals  BP (!) 79/62  MAP (mmHg) 69  Pulse Rate 89  ECG Heart Rate 90  Resp 20  Oxygen Therapy  SpO2 99 %  MEWS Score  MEWS RR 0  MEWS Pulse 0  MEWS Systolic 2  MEWS LOC 0  MEWS Temp 2  MEWS Score 4  MEWS Score Color Red  paged Merlene Laughter NP, for low bp, new orders rec'd for 1L bolus LR

## 2019-09-14 NOTE — Progress Notes (Signed)
Paged Erin PA regarding G/J tube fell out when transfer to chair. Will notify Dr. [name] and new orders placed to replace in IR.

## 2019-09-14 NOTE — Progress Notes (Signed)
Have received multiple calls from nursing regarding patient.  He again febrile with temperature >101.  His PEG tube fell out this morning.  He continues to have difficulty coughing up very thickened sputum.  Esophagram reviewed:  This shows complete obstruction of esophagus.  Will make patient NPO.  His PEG tube has fallen out, he has a foley catheter in place to hold placement site open.  IR consult has been obtained for repeat placement of PEG tube.  Being he is NPO his blood sugar is low in the 80s, he will be restarted on D5 1/2 NS at 75 ml per hour.  The foley can be utilized for medication administration.   1243: contacted via rapid response nurse, she is concerned as patient's pressure is 70.  She feels he is toxic and is dry.  She is going to treat him with bolus of NS at 500.  He is already on broad spectrum ABX.  Blood cultures have been drawn early this morning.  He also has sputum which is pending, and urine test ordered.   Plan:  Patient is NPO Tylenol suppositories for fever Hypotension- has been restarted on fluids Continue ABX that were started this morning as he is Septic and we are working up source Tx to SICU and consult CCM  Ellwood Handler, PA-C

## 2019-09-14 NOTE — TOC Initial Note (Signed)
Transition of Care (TOC) - Initial/Assessment Note    Patient Details  Name: George Wade. MRN: [id number] Date of Birth: Mar 12, 1956  Transition of [facility]) CM/SW Contact:    Alberteen Sam, [place] Phone Number: [phone] 09/14/2019, 9:32 AM  Clinical Narrative:                  CSW spoke with patient over the phone regarding discharge planning, he reports he is agreeable to SNF recommendation however has questions regarding costs. CSW inquired as to patient's status of medicaid pending with no current insurance, patient reports his Medicare has started today.   Patient reports he is currently busy and asked CSW to call back in an hour to continue SNF discussion.   Expected Discharge Plan: [place] Barriers to Discharge: Inadequate or no insurance, Continued Medical Work up   Patient Goals and CMS Choice Patient states their goals for this hospitalization and ongoing recovery are:: to go to SNF CMS Medicare.gov Compare Post Acute Care list provided to:: Patient Choice offered to / list presented to : Patient  Expected Discharge Plan and Services Expected Discharge Plan: [place]       Living arrangements for the past 2 months: Hotel/Motel                                      Prior Living Arrangements/Services Living arrangements for the past 2 months: Hotel/Motel Lives with:: Self Patient language and need for interpreter reviewed:: Yes Do you feel safe going back to the place where you live?: No   needs short term rehab  Need for Family Participation in Patient Care: Yes (Comment) Care giver support system in place?: Yes (comment)   Criminal Activity/Legal Involvement Pertinent to Current Situation/Hospitalization: No - Comment as needed  Activities of Daily Living Home Assistive Devices/Equipment: None ADL Screening (condition at time of admission) Patient's cognitive ability adequate to safely complete daily  activities?: Yes Is the patient deaf or have difficulty hearing?: No Does the patient have difficulty seeing, even when wearing glasses/contacts?: No Does the patient have difficulty concentrating, remembering, or making decisions?: No Patient able to express need for assistance with ADLs?: Yes Does the patient have difficulty dressing or bathing?: No Independently performs ADLs?: Yes (appropriate for developmental age) Does the patient have difficulty walking or climbing stairs?: No Weakness of Legs: None Weakness of Arms/Hands: None  Permission Sought/Granted Permission sought to share information with : Case Manager, Customer service manager, Family Supports Permission granted to share information with : Yes, Verbal Permission Granted  Share Information with NAME: Patty  Permission granted to share info w AGENCY: SNFs  Permission granted to share info w Relationship: sister  Permission granted to share info w Contact Information: [phone]  Emotional Assessment Appearance:: Other (Comment Required(remote- unable to assess) Attitude/Demeanor/Rapport: Gracious Affect (typically observed): Calm Orientation: : Oriented to Self, Oriented to Place, Oriented to  Time, Oriented to Situation Alcohol / Substance Use: Not Applicable Psych Involvement: No (comment)  Admission diagnosis:  Esophageal Perforation Patient Active Problem List   Diagnosis Date Noted  . 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 Esophageal perforation 09/04/2019  . Anal condylomata   . Rectal bleeding 08/20/2019  . Rectal mass 08/20/2019  . PEG (percutaneous endoscopic gastrostomy) status ([place])   . Aspiration into airway   . Fall 08/15/2019  . Elevated LFTs   . Esophageal stricture   .  Problems with swallowing and mastication   . Stricture and stenosis of esophagus   . Malnutrition of moderate degree 07/31/2019  . Nausea & vomiting   . Pulmonary emboli ([place]) 07/28/2019  . Alcohol-induced mood disorder  ([place]) 07/28/2019  . Alcohol dependence with uncomplicated intoxication ([place])   . Acute respiratory failure with hypoxia ([place]) 05/15/2019  . Wernicke's encephalopathy 04/15/2019  . GI bleed 03/31/2019  . Alcohol withdrawal ([place]) 12/09/2017   PCP:  Patient, [name] Per Pharmacy:   [facility] [address], [place] [address] Phone: [phone] Fax: [phone]  [place], [place] - [place] [address] Phone: [phone] Fax: [phone]     Social Determinants of Health (SDOH) Interventions    Readmission Risk Interventions No flowsheet data found.
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
## 2019-09-14 NOTE — Significant Event (Addendum)
Rapid Response Event Note  Overview: Respiratory and Sepsis   Initial Focused Assessment: I received a call from nursing staff at 1127 with concerns of patient's respiratory status. Per nurse, patient was tachypneic, had thick secretions and was having a hard time clearing/coughing up the secretions. I was with another patient in an emergency so I asked the nursing staff to notify the provider and I would come see the patient as soon I as could. I arrived at 1210. Upon arrival patient was drowsy at first, he woke quickly to my voice, was relatively oriented, endorsed having RT sided pain in his chest around his CTs - he said that this pain was not new but still quite painful. + SubQ AIR - RT ARM, RT Clavicle, RT side of the chest - the SubQ AIR is not new but I am unsure if it has worsened. Lung sounds - rhonchi throughout the lower bases. Nurse had already NTS the patient and since then per nurse, his lung sounds have improved. 100% on 5L [place], RR 22-26, not in acute distress. Patient is febrile (nurse gave APAP), HR 90s. + diaphoretic, appears toxic - septic. SBP in the 70-80s. Patient did receive some Lopressor earlier for tachycardia and per nurse SBP was in the 140s earlier. Over the course of the time that I was there, patient was more lethargic but would arouse to voice still. Skin cool, clammy, and veins are flat - patient appears to be dry. WBC 23.7 (increased from 8.8)  Interventions: -- 500 cc NS Bolus -- D5 1/2NS@ 100 cc/hr MIVF -- Already on ABXs -- CVTS PA updated - ordered ABG and will transfer to ICU -- 3rd PIV placed    Plan of Care: -- PCCM came to see the patient, SBP improved to the 90s, MAPS improved to low 70s. -- Patient to transfer to [facility] - I already called 2H and PP about the patient needing to be transferred to the ICU.  -- I left to see another patient in an emergency but I received a call from nursing staff at 1431, per nurse SBP dropped again into the 70s. I asked  that 500 cc NS bolus be given and PCCM be consulted. I also called the 2H CRN and asked that the patient be transferred over as soon as possible or an ICU nurse needs to help with the management of the patient   Event Summary:  Call Time 1127 Start Time 1210 End Time 1405  Chon Buhl R

## 2019-09-15 ENCOUNTER — Inpatient Hospital Stay (HOSPITAL_COMMUNITY): Payer: Self-pay

## 2019-09-15 ENCOUNTER — Encounter (HOSPITAL_COMMUNITY): Payer: Self-pay | Admitting: Interventional Radiology

## 2019-09-15 DIAGNOSIS — J982 Interstitial emphysema: Secondary | ICD-10-CM

## 2019-09-15 DIAGNOSIS — R6521 Severe sepsis with septic shock: Secondary | ICD-10-CM

## 2019-09-15 HISTORY — PX: IR REPLC GASTRO/COLONIC TUBE PERCUT W/FLUORO: IMG2333

## 2019-09-15 LAB — GLUCOSE, CAPILLARY
Glucose-Capillary: 104 mg/dL — ABNORMAL HIGH (ref 70–99)
Glucose-Capillary: 112 mg/dL — ABNORMAL HIGH (ref 70–99)
Glucose-Capillary: 116 mg/dL — ABNORMAL HIGH (ref 70–99)
Glucose-Capillary: 120 mg/dL — ABNORMAL HIGH (ref 70–99)
Glucose-Capillary: 88 mg/dL (ref 70–99)
Glucose-Capillary: 92 mg/dL (ref 70–99)

## 2019-09-15 LAB — BASIC METABOLIC PANEL
Anion gap: 9 (ref 5–15)
BUN: 11 mg/dL (ref 8–23)
CO2: 25 mmol/L (ref 22–32)
Calcium: 8.4 mg/dL — ABNORMAL LOW (ref 8.9–10.3)
Chloride: 111 mmol/L (ref 98–111)
Creatinine, Ser: 0.58 mg/dL — ABNORMAL LOW (ref 0.61–1.24)
GFR calc Af Amer: >60 mL/min (ref >60)
GFR calc non Af Amer: >60 mL/min (ref >60)
Glucose, Bld: 96 mg/dL (ref 70–99)
Potassium: 3.9 mmol/L (ref 3.5–5.1)
Sodium: 145 mmol/L (ref 135–145)

## 2019-09-15 LAB — CBC
HCT: 28.1 % — ABNORMAL LOW (ref 39.0–52.0)
Hemoglobin: 8.3 g/dL — ABNORMAL LOW (ref 13.0–17.0)
MCH: 26.5 pg (ref 26.0–34.0)
MCHC: 29.5 g/dL — ABNORMAL LOW (ref 30.0–36.0)
MCV: 89.8 fL (ref 80.0–100.0)
Platelets: 312 10*3/uL (ref 150–400)
RBC: 3.13 MIL/uL — ABNORMAL LOW (ref 4.22–5.81)
RDW: 20.1 % — ABNORMAL HIGH (ref 11.5–15.5)
WBC: 15.5 10*3/uL — ABNORMAL HIGH (ref 4.0–10.5)
nRBC: 0 % (ref 0.0–0.2)

## 2019-09-15 LAB — MAGNESIUM: Magnesium: 1.9 mg/dL (ref 1.7–2.4)

## 2019-09-15 LAB — PROCALCITONIN: Procalcitonin: 0.43 ng/mL

## 2019-09-15 LAB — PHOSPHORUS: Phosphorus: 3.8 mg/dL (ref 2.5–4.6)

## 2019-09-15 MED ORDER — LIDOCAINE VISCOUS HCL 2 % MT SOLN
OROMUCOSAL | Status: AC
  Filled 2019-09-15: qty 15

## 2019-09-15 MED ORDER — IOHEXOL 300 MG/ML  SOLN
50.0000 mL | Freq: Once | INTRAMUSCULAR | Status: AC | PRN
  Administered 2019-09-15: 25 mL

## 2019-09-15 MED ORDER — OXYCODONE HCL 5 MG/5ML PO SOLN
5.0000 mg | ORAL | Status: DC | PRN
  Administered 2019-09-15 – 2019-09-19 (×22): 5 mg
  Filled 2019-09-15 (×23): qty 5

## 2019-09-15 NOTE — Progress Notes (Signed)
NAME:  George Winch., MRN:  [id number], DOB:  10/17/1955, LOS: 12 ADMISSION DATE:  09/03/2019, CONSULTATION DATE:  09/14/2019 REFERRING MD:  Melrose Nakayama, MD, CHIEF COMPLAINT:  hypotension  Brief History   63yo presented with complaints of chest and abdominal pain and found to have ruptured esophagus with recent episode of perfuse vomiting PTA. Underwent right thoracotomy 11/23. Extubated 11/4.  Acute decompensation overnight of 12/3 with development of fever and hypotension, PCCM consulted for management of new onset sepsis.    Consults:  PCCM  Interim history/subjective:  Briefly on norepinephrine yesterday afternoon, was discontinued by 8 PM last night.  Pain controlled on morphine  Objective   Blood pressure 100/70, pulse 89, temperature 98 F (36.7 C), temperature source Oral, resp. rate 20, height 5\' 8"  (1.727 m), weight 70.6 kg, SpO2 96 %.        Intake/Output Summary (Last 24 hours) at 09/15/2019 0828 Last data filed at 09/15/2019 0700 Gross per 24 hour  Intake 4109.14 ml  Output 910 ml  Net 3199.14 ml   Filed Weights   09/13/19 0333 09/14/19 0419 09/15/19 0500  Weight: 66 kg 66 kg 70.6 kg    Examination: General: Resting comfortably, no distress HENT: sclera anicteric, mucous membranes moist Lungs: Clear, no increased work of breathing, no wheezes or crackles symmetric chest wall excursion. Cardiovascular: Regular rate and rhythm, no murmurs rubs or gallops Abdomen: Scaphoid, soft, normal bowel sounds.  Gastric ostomy site maintained with Foley. 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  He has had thoracotomy with ruptured esophagus repair.   Septic shock:  -most likely 2/2 recent esophageal rupture -Blood cultures negative, he was fluid responsive and only briefly requiring norepinephrine. 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  -small apical ptx noted on imaging this am.  -per CTS  Discussed with CT surgery, okay for transfer back to 2c PCCM to sign off, please not hesitate to contact us if we can be of further assistance.  Labs   CBC: Recent Labs  Lab 09/11/19 0413 09/12/19 0311 09/13/19 0340 09/14/19 0709 09/15/19 0317  WBC 7.8 8.8 8.8 23.7* 15.5*  HGB 9.9* 9.7* 8.9* 9.2* 8.3*  HCT 32.8* 31.6* 30.0* 31.3* 28.1*  MCV 87.2 87.3 89.6 90.5 89.8  PLT 290 297 292 328 301    Basic Metabolic Panel: Recent Labs  Lab 09/11/19 0413 09/12/19 0311 09/12/19 1122 09/14/19 0709 09/15/19 0317  NA 146* 146* 150* 145 145  K 3.1* 3.2* 3.2* 4.1 3.9  CL 109 114* 115* 113* 111  CO2 23 25 27 24 25   GLUCOSE 95 112* 129* 139* 96  BUN 11 13 12 21 11   CREATININE 0.96 0.71 0.65 0.71 0.58*  CALCIUM 9.0 9.0 8.9 8.5* 8.4*  MG  --   --   --   --  1.9  PHOS  --   --   --   --  3.8   GFR: Estimated Creatinine Clearance: 91.4 mL/min (A) (by C-G formula based  on SCr of 0.58 mg/dL (L)). Recent Labs  Lab 09/12/19 0311 09/13/19 0340 09/14/19 0709 09/14/19 1431 09/14/19 1450 09/14/19 1941 09/15/19 0317  PROCALCITON  --   --   --  0.49  --   --  0.43  WBC 8.8 8.8 23.7*  --   --   --  15.5*  LATICACIDVEN  --   --   --   --  1.4 1.4  --     Liver Function Tests: Recent Labs  Lab 09/12/19 1122  AST 12*  ALT 10  ALKPHOS 82  BILITOT 0.7  PROT 5.2*  ALBUMIN 2.3*   No results for input(s): LIPASE, AMYLASE in the last 168 hours. No results for input(s): AMMONIA in the last 168 hours.  ABG    Component Value Date/Time    PHART 7.432 09/14/2019 1405   PCO2ART 37.5 09/14/2019 1405   PO2ART 83.3 09/14/2019 1405   HCO3 24.1 09/14/2019 1405   TCO2 28 09/04/2019 1241   ACIDBASEDEF 6.0 (H) 09/04/2019 0522   O2SAT 95.4 09/14/2019 1405     CBG: Recent Labs  Lab 09/14/19 1553 09/14/19 1954 09/15/19 0005 09/15/19 0417 09/15/19 0752  GLUCAP 103* 98 112* 88 104*    Critical care time:   The patient is critically ill with multiple organ systems failure and requires high complexity decision making for assessment and support, frequent evaluation and titration of therapies, application of advanced monitoring technologies and extensive interpretation of multiple databases.   Critical Care Time devoted to patient care services described in this note is 31 minutes. This time reflects the time of my personal involvement. This critical care time does not reflect separately billable procedures or procedure time, teaching time or supervisory time of PA/NP/Med student/Med Resident etc but could involve care discussion time.  [facility] 09/15/2019 8:28 AM  Pager: [phone] After hours pager: [phone]

## 2019-09-15 NOTE — Progress Notes (Signed)
12 Days Post-Op Procedure(s) (LRB): THORACOTOMY MAJOR FOR ESOPHAGEAL PERFORATION WITH INTERCOSTAL MUSCLE FLAP (Right) Subjective: Not having a very good day Objective: Vital signs in last 24 hours: Temp:  [97.4 F (36.3 C)-101.5 F (38.6 C)] 98 F (36.7 C) (12/05 0755) Pulse Rate:  [83-126] 88 (12/05 0800) Cardiac Rhythm: Normal sinus rhythm (12/05 0800) Resp:  [15-30] 20 (12/05 0800) BP: (76-143)/(57-95) 108/82 (12/05 0800) SpO2:  [90 %-100 %] 96 % (12/05 0800) Weight:  [70.6 kg] 70.6 kg (12/05 0500)  Hemodynamic parameters for last 24 hours:    Intake/Output from previous day: 12/04 0701 - 12/05 0700 In: 4227.1 [I.V.:1858.2; NG/GT:417; IV Piggyback:1951.9] Out: 1210 [Urine:1100; Drains:100; Chest Tube:10] Intake/Output this shift: Total I/O In: -  Out: 120 [Drains:100; Chest Tube:20]  General appearance: alert and cooperative Neurologic: intact Heart: regular rate and rhythm, S1, S2 normal, no murmur, click, rub or gallop Lungs: clear to auscultation bilaterally Abdomen: soft, non-tender; bowel sounds normal; no masses,  no organomegaly Wound: c/d/i  Lab Results: Recent Labs    09/14/19 0709 09/15/19 0317  WBC 23.7* 15.5*  HGB 9.2* 8.3*  HCT 31.3* 28.1*  PLT 328 312   BMET:  Recent Labs    09/14/19 0709 09/15/19 0317  NA 145 145  K 4.1 3.9  CL 113* 111  CO2 24 25  GLUCOSE 139* 96  BUN 21 11  CREATININE 0.71 0.58*  CALCIUM 8.5* 8.4*    PT/INR: No results for input(s): LABPROT, INR in the last 72 hours. ABG    Component Value Date/Time   PHART 7.432 09/14/2019 1405   HCO3 24.1 09/14/2019 1405   TCO2 28 09/04/2019 1241   ACIDBASEDEF 6.0 (H) 09/04/2019 0522   O2SAT 95.4 09/14/2019 1405   CBG (last 3)  Recent Labs    09/15/19 0005 09/15/19 0417 09/15/19 0752  GLUCAP 112* 88 104*    Assessment/Plan: S/P Procedure(s) (LRB): THORACOTOMY MAJOR FOR ESOPHAGEAL PERFORATION WITH INTERCOSTAL MUSCLE FLAP (Right) replace G-J tube  Continue abx Ok  to move back to progressive care   LOS: 12 days    Wonda Olds 09/15/2019

## 2019-09-16 ENCOUNTER — Inpatient Hospital Stay (HOSPITAL_COMMUNITY): Payer: Self-pay

## 2019-09-16 LAB — GLUCOSE, CAPILLARY
Glucose-Capillary: 112 mg/dL — ABNORMAL HIGH (ref 70–99)
Glucose-Capillary: 120 mg/dL — ABNORMAL HIGH (ref 70–99)
Glucose-Capillary: 128 mg/dL — ABNORMAL HIGH (ref 70–99)
Glucose-Capillary: 134 mg/dL — ABNORMAL HIGH (ref 70–99)
Glucose-Capillary: 136 mg/dL — ABNORMAL HIGH (ref 70–99)
Glucose-Capillary: 152 mg/dL — ABNORMAL HIGH (ref 70–99)
Glucose-Capillary: 154 mg/dL — ABNORMAL HIGH (ref 70–99)

## 2019-09-16 LAB — CBC WITH DIFFERENTIAL/PLATELET
Abs Immature Granulocytes: 0.03 10*3/uL (ref 0.00–0.07)
Basophils Absolute: 0 10*3/uL (ref 0.0–0.1)
Basophils Relative: 0 %
Eosinophils Absolute: 0.2 10*3/uL (ref 0.0–0.5)
Eosinophils Relative: 2 %
HCT: 26.1 % — ABNORMAL LOW (ref 39.0–52.0)
Hemoglobin: 7.9 g/dL — ABNORMAL LOW (ref 13.0–17.0)
Immature Granulocytes: 0 %
Lymphocytes Relative: 8 %
Lymphs Abs: 0.7 10*3/uL (ref 0.7–4.0)
MCH: 26.8 pg (ref 26.0–34.0)
MCHC: 30.3 g/dL (ref 30.0–36.0)
MCV: 88.5 fL (ref 80.0–100.0)
Monocytes Absolute: 0.3 10*3/uL (ref 0.1–1.0)
Monocytes Relative: 3 %
Neutro Abs: 7.6 10*3/uL (ref 1.7–7.7)
Neutrophils Relative %: 87 %
Platelets: 286 10*3/uL (ref 150–400)
RBC: 2.95 MIL/uL — ABNORMAL LOW (ref 4.22–5.81)
RDW: 19.4 % — ABNORMAL HIGH (ref 11.5–15.5)
WBC: 8.8 10*3/uL (ref 4.0–10.5)
nRBC: 0 % (ref 0.0–0.2)

## 2019-09-16 LAB — BASIC METABOLIC PANEL
Anion gap: 7 (ref 5–15)
BUN: 9 mg/dL (ref 8–23)
CO2: 24 mmol/L (ref 22–32)
Calcium: 7.9 mg/dL — ABNORMAL LOW (ref 8.9–10.3)
Chloride: 109 mmol/L (ref 98–111)
Creatinine, Ser: 0.56 mg/dL — ABNORMAL LOW (ref 0.61–1.24)
GFR calc Af Amer: >60 mL/min (ref >60)
GFR calc non Af Amer: >60 mL/min (ref >60)
Glucose, Bld: 92 mg/dL (ref 70–99)
Potassium: 3.1 mmol/L — ABNORMAL LOW (ref 3.5–5.1)
Sodium: 140 mmol/L (ref 135–145)

## 2019-09-16 LAB — CULTURE, RESPIRATORY W GRAM STAIN: Culture: NORMAL

## 2019-09-16 LAB — PROCALCITONIN: Procalcitonin: 0.41 ng/mL

## 2019-09-16 MED ORDER — POTASSIUM CHLORIDE 10 MEQ/100ML IV SOLN
10.0000 meq | INTRAVENOUS | Status: AC
  Administered 2019-09-16 (×6): 10 meq via INTRAVENOUS
  Filled 2019-09-16 (×6): qty 100

## 2019-09-16 NOTE — Progress Notes (Signed)
Physical Therapy Treatment Patient Details Name: George Wade. MRN: [id number] DOB: [birth year] Today's Date: 09/16/2019    History of Present Illness Pt is a 63 y.o. male admitted 09/03/19 with c/o R-side chest pain. CT showed R hydropneumothorax and extensive distal pneumomediastinum. Pt s/p R major thoracotomy for esophageal performation with intercostal muscle flap on 11/24. PMH includes alcohol abuse, non-Hodgkin's lymphoma, tonsillar cancer, HTN, PE, esophageal stricture. Of note, recent admission 07/2019-08/2019 for chest pain and alcohol-induced mood disorder.    PT Comments    Pt tolerated treatment well demonstrating improved OOB mobility and tolerance for activity. Pt with slowed and hesitant gait at times but able to ambulate household distances with close supervision and use of RW. Pt remains limited in activity tolerance and will continue to benefit from acute PT services to improve tolerance for all mobility.  Pt has no assistance or caregivers and will need to mobilize at a modI level at time of discharge, or receive post-acute inpatient therapies.  Follow Up Recommendations  SNF;Supervision for mobility/OOB     Equipment Recommendations  Rolling walker with 5" wheels    Recommendations for Other Services       Precautions / Restrictions Precautions Precautions: Fall Precaution Comments: Multiple lines, chest tubes; s/p thoracotomy Restrictions Weight Bearing Restrictions: No    Mobility  Bed Mobility Overal bed mobility: Needs Assistance Bed Mobility: Supine to Sit   Sidelying to sit: Supervision;HOB elevated          Transfers Overall transfer level: Needs assistance Equipment used: Rolling walker (2 wheeled) Transfers: Sit to/from Stand Sit to Stand: Supervision            Ambulation/Gait Ambulation/Gait assistance: Supervision Gait Distance (Feet): 120 Feet Assistive device: Rolling walker (2 wheeled) Gait Pattern/deviations: Step-to  pattern;Trunk flexed Gait velocity: reduced Gait velocity interpretation: <1.8 ft/sec, indicate of risk for recurrent falls General Gait Details: pt with shortened step to gait pattern, hesitant and slowed gait around traffic in hallway   Stairs             Wheelchair Mobility    Modified Rankin (Stroke Patients Only)       Balance Overall balance assessment: Needs assistance Sitting-balance support: Feet supported;No upper extremity supported Sitting balance-Leahy Scale: Good Sitting balance - Comments: supervision   Standing balance support: Bilateral upper extremity supported Standing balance-Leahy Scale: Good Standing balance comment: supervision                            Cognition Arousal/Alertness: Awake/alert Behavior During Therapy: WFL for tasks assessed/performed Overall Cognitive Status: Within Functional Limits for tasks assessed                                        Exercises      General Comments General comments (skin integrity, edema, etc.): 2L [place], VSS      Pertinent Vitals/Pain Pain Assessment: 0-10 Pain Score: 6  Pain Location: R flank, chest tube site Pain Descriptors / Indicators: Aching Pain Intervention(s): Limited activity within patient's tolerance    Home Living                      Prior Function            PT Goals (current goals can now be found in the care plan section) Acute Rehab PT Goals  Patient Stated Goal: to go to rehab and get stronger Progress towards PT goals: Progressing toward goals    Frequency    Min 3X/week      PT Plan Current plan remains appropriate    Co-evaluation              AM-PAC PT "6 Clicks" Mobility   Outcome Measure  Help needed turning from your back to your side while in a flat bed without using bedrails?: None Help needed moving from lying on your back to sitting on the side of a flat bed without using bedrails?: None Help needed  moving to and from a bed to a chair (including a wheelchair)?: None Help needed standing up from a chair using your arms (e.g., wheelchair or bedside chair)?: None Help needed to walk in hospital room?: None Help needed climbing 3-5 steps with a railing? : A Lot 6 Click Score: 22    End of Session Equipment Utilized During Treatment: Oxygen Activity Tolerance: Patient tolerated treatment well Patient left: in chair;with call bell/phone within reach;with chair alarm set Nurse Communication: Mobility status PT Visit Diagnosis: Other abnormalities of gait and mobility (R26.89);Muscle weakness (generalized) (M62.81) Pain - Right/Left: Right Pain - part of body: (chest tube site)     Time: 7867-6720 PT Time Calculation (min) (ACUTE ONLY): 46 min  Charges:  $Gait Training: 8-22 mins $Therapeutic Activity: 23-37 mins                     Zenaida Niece, PT, DPT Acute Rehabilitation Pager: [place] 09/16/2019, 9:15 AM

## 2019-09-16 NOTE — Progress Notes (Signed)
[address]       [employer] [address]             [phone]      13 Days Post-Op Procedure(s) (LRB): THORACOTOMY MAJOR FOR ESOPHAGEAL PERFORATION WITH INTERCOSTAL MUSCLE FLAP (Right) Subjective: Walked some in halls with assist/walker. Mod surgical pain  Objective: Vital signs in last 24 hours: Temp:  [97.7 F (36.5 C)-98.9 F (37.2 C)] 98.9 F (37.2 C) (12/06 0734) Pulse Rate:  [82-97] 87 (12/06 0734) Cardiac Rhythm: Normal sinus rhythm (12/06 0719) Resp:  [14-24] 16 (12/06 0734) BP: (97-129)/(72-89) 125/85 (12/06 0734) SpO2:  [88 %-100 %] 98 % (12/06 0741) Weight:  [70.1 kg] 70.1 kg (12/06 0412)  Hemodynamic parameters for last 24 hours:    Intake/Output from previous day: 12/05 0701 - 12/06 0700 In: 3195.1 [P.O.:120; I.V.:1882.6; NG/GT:745; IV Piggyback:297.5] Out: 1280 [Urine:1100; Drains:100; Chest Tube:80] Intake/Output this shift: Total I/O In: 609.2 [I.V.:151.7; NG/GT:0.9; IV Piggyback:456.6] Out: -   General appearance: alert, cooperative and no distress Heart: regular rate and rhythm Lungs: dim right base with coarse BS and some crackles Abdomen: minor tenderness at tube site Extremities: no edema or calf tenderness Wound: incis healing well  Lab Results: Recent Labs    09/15/19 0317 09/16/19 0210  WBC 15.5* 8.8  HGB 8.3* 7.9*  HCT 28.1* 26.1*  PLT 312 286   BMET:  Recent Labs    09/15/19 0317 09/16/19 0210  NA 145 140  K 3.9 3.1*  CL 111 109  CO2 25 24  GLUCOSE 96 92  BUN 11 9  CREATININE 0.58* 0.56*  CALCIUM 8.4* 7.9*    PT/INR: No results for input(s): LABPROT, INR in the last 72 hours. ABG    Component Value Date/Time   PHART 7.432 09/14/2019 1405   HCO3 24.1 09/14/2019 1405   TCO2 28 09/04/2019 1241   ACIDBASEDEF 6.0 (H) 09/04/2019 0522   O2SAT 95.4 09/14/2019 1405   CBG (last 3)  Recent Labs    09/15/19 2047 09/16/19 0012 09/16/19 0401  GLUCAP 116* 152* 154*    Meds Scheduled Meds:   acetaminophen (TYLENOL) oral liquid 160 mg/5 mL  650 mg Per Tube Q6H   acetylcysteine  2 mL Nebulization BID   chlorhexidine  15 mL Mouth Rinse BID   Chlorhexidine Gluconate Cloth  6 each Topical Daily   enoxaparin (LOVENOX) injection  1 mg/kg (Adjusted) Subcutaneous Q12H   feeding supplement (PRO-STAT SUGAR FREE 64)  30 mL Per Tube BID   folic acid  1 mg Intravenous Daily   insulin aspart  0-24 Units Subcutaneous Q4H   mouth rinse  15 mL Mouth Rinse q12n4p   pantoprazole (PROTONIX) IV  40 mg Intravenous Q24H   QUEtiapine  100 mg Per J Tube QHS   thiamine injection  100 mg Intravenous Daily   Continuous Infusions:  dextrose 5 % and 0.45% NaCl 100 mL/hr at 09/16/19 0731   feeding supplement (OSMOLITE 1.5 CAL) 60 mL/hr at 09/16/19 0731   fluconazole (DIFLUCAN) IV 100 mL/hr at 09/16/19 0731   norepinephrine (LEVOPHED) Adult infusion Stopped (09/14/19 1951)   piperacillin-tazobactam (ZOSYN)  IV 12.5 mL/hr at 09/16/19 0731   vancomycin 200 mL/hr at 09/16/19 0731   PRN Meds:.acetaminophen, guaiFENesin, hydrocortisone cream, ipratropium-albuterol, LORazepam, morphine injection, ondansetron (ZOFRAN) IV, oxyCODONE  Xrays Ir Replc Gastro/colonic Tube Percut W/fluoro  Result Date: 09/15/2019 INDICATION: Gastrostomy tube placed 08/17/2019 , converted to gastrojejunostomy catheter on 11/25. The dual lumen catheter became pulled out,  and a temporary Foley catheter was placed in the tract. EXAM: GASTROJEJUNOSTOMY CATHETER REPLACEMENT MEDICATIONS: None indicated ANESTHESIA/SEDATION: Viscous lidocaine at the skin entry site CONTRAST:  59mL OMNIPAQUE IOHEXOL 300 MG/ML SOLN - administered into the gastric lumen. FLUOROSCOPY TIME:  4.2 minute; 182  uGym2 DAP COMPLICATIONS: None immediate. PROCEDURE: Informed written consent was obtained from the patient after a thorough discussion of the procedural risks, benefits and alternatives. All questions were addressed. Maximal Sterile Barrier  Technique was utilized including caps, mask, sterile gowns, sterile gloves, sterile drape, hand hygiene and skin antiseptic. A timeout was performed prior to the initiation of the procedure. Small contrast injection through the Foley catheter demonstrates good position of the catheter tip within the lumen of the stomach. The catheter was exchanged over a stiff Amplatz Glidewire for a Kumpe catheter advanced to the jejunum . The catheter was then exchanged over the guidewire for a new dual-lumen gastrojejunostomy catheter, advanced with the distal tip just beyond the ligament of Treitz, retention balloon in the stomach. The retention balloon was inflated with 10 mL saline. Contrast injection confirmed patency of both lumens and appropriate positioning. The patient tolerated the procedure well. IMPRESSION: 1. Technically successful exchange of gastrostomy temporary catheter for dual-lumen gastrojejunostomy device, ready for routine use. Electronically Signed   By: Lucrezia Europe M.D.   On: 09/15/2019 13:37   Dg Chest Port 1 View  Result Date: 09/15/2019 CLINICAL DATA:  Status post thoracotomy. EXAM: PORTABLE CHEST 1 VIEW COMPARISON:  09/14/2019 FINDINGS: Three right chest tubes noted. No appreciable change in tiny pneumothorax identified at this time. No change and the appearance of pneumomediastinum. Decreased lung volumes. No pleural effusion or edema. No airspace opacities. Extensive right chest wall and right neck subcutaneous emphysema IMPRESSION: 1. No change in tiny right apical pneumothorax status post thoracotomy. 2. No change in pneumomediastinum. 3. Decreased lung volumes. Electronically Signed   By: Kerby Moors M.D.   On: 09/15/2019 11:07   Dg Esophagus W/water Raelene Bott Cm  Addendum Date: 09/14/2019   ADDENDUM REPORT: 09/14/2019 11:57 ADDENDUM: These results were called by telephone at the time of interpretation on 09/14/2019 at 11:45 am to provider [facility] , who verbally acknowledged these  results. Electronically Signed   By: Richardean Sale M.D.   On: 09/14/2019 11:57   Result Date: 09/14/2019 CLINICAL DATA:  Status post repair of distal esophageal perforation 09/03/2019. Evaluate for esophageal leak. Persistent right pneumothorax and pneumomediastinum. EXAM: ESOPHOGRAM/BARIUM SWALLOW TECHNIQUE: Single contrast examination was performed using 25 cc Omnipaque 300 by mouth. FLUOROSCOPY TIME:  Fluoroscopy Time: 1 minutes and 30 seconds of low-dose pulsed fluoroscopy. Radiation Exposure Index (if provided by the fluoroscopic device): 15.3 mGy Number of Acquired Spot Images: 2 COMPARISON:  Portable chest 09/14/2019, esophagram 09/11/2019 and chest CT 09/03/2019. FINDINGS: Study was performed in the supine and semi erect positions. The patient swallowed the contrast without difficulty. No abnormality of the proximal esophagus was observed. However, at the level of the previously demonstrated distal esophageal stricture, there is complete obstruction of the esophageal lumen. No foreign body or contrast extravasation was demonstrated. The patient's head was elevated in attempt to have contrast pass through the distal esophagus. The patient could only tolerate approximately 45 degrees of elevation, and this did not result in contrast passing through the distal esophagus during approximately 5 minutes of intermittent fluoroscopic observation. No aspiration was observed. IMPRESSION: 1. Complete obstruction of the esophageal lumen status post repair of esophageal perforation. 2. No foreign body or extravasation demonstrated. Electronically  Signed: By: Richardean Sale M.D. On: 09/14/2019 11:33    Assessment/Plan: S/P Procedure(s) (LRB): THORACOTOMY MAJOR FOR ESOPHAGEAL PERFORATION WITH INTERCOSTAL MUSCLE FLAP (Right)  1 slow but steady clinical improvement, afebrile, hemodyn stable in sinus rhythm- tachy at times 2 sats good on 2 liters 3 no leukocytosis 5 H/H fairly stable ABL anemia 6 normal renal  fxn 7 hypokalemia- replace 8 tol TF's at 60/hr 9 conts broad spectrum ABX as current 10 BS adeq control 11 conts current Lovenox for recent PE's 12 minor CT drainage- continue for now  LOS: 13 days    John Giovanni [facility] 09/16/2019 Pager [phone]- not for patient use

## 2019-09-16 NOTE — Progress Notes (Addendum)
PIV consult: Long 22g placed with ultrasound in L forearm. Please consider PICC placement if pt will continue on Vancomycin. Discussed with Vinnie Level, RN.

## 2019-09-17 ENCOUNTER — Inpatient Hospital Stay: Payer: Self-pay

## 2019-09-17 LAB — GLUCOSE, CAPILLARY
Glucose-Capillary: 111 mg/dL — ABNORMAL HIGH (ref 70–99)
Glucose-Capillary: 114 mg/dL — ABNORMAL HIGH (ref 70–99)
Glucose-Capillary: 115 mg/dL — ABNORMAL HIGH (ref 70–99)
Glucose-Capillary: 120 mg/dL — ABNORMAL HIGH (ref 70–99)
Glucose-Capillary: 152 mg/dL — ABNORMAL HIGH (ref 70–99)
Glucose-Capillary: 86 mg/dL (ref 70–99)
Glucose-Capillary: 99 mg/dL (ref 70–99)

## 2019-09-17 LAB — BASIC METABOLIC PANEL
Anion gap: 7 (ref 5–15)
BUN: 7 mg/dL — ABNORMAL LOW (ref 8–23)
CO2: 25 mmol/L (ref 22–32)
Calcium: 8 mg/dL — ABNORMAL LOW (ref 8.9–10.3)
Chloride: 107 mmol/L (ref 98–111)
Creatinine, Ser: 0.47 mg/dL — ABNORMAL LOW (ref 0.61–1.24)
GFR calc Af Amer: >60 mL/min (ref >60)
GFR calc non Af Amer: >60 mL/min (ref >60)
Glucose, Bld: 102 mg/dL — ABNORMAL HIGH (ref 70–99)
Potassium: 3.7 mmol/L (ref 3.5–5.1)
Sodium: 139 mmol/L (ref 135–145)

## 2019-09-17 LAB — CBC
HCT: 26.5 % — ABNORMAL LOW (ref 39.0–52.0)
Hemoglobin: 8.1 g/dL — ABNORMAL LOW (ref 13.0–17.0)
MCH: 26.6 pg (ref 26.0–34.0)
MCHC: 30.6 g/dL (ref 30.0–36.0)
MCV: 86.9 fL (ref 80.0–100.0)
Platelets: 338 10*3/uL (ref 150–400)
RBC: 3.05 MIL/uL — ABNORMAL LOW (ref 4.22–5.81)
RDW: 18.9 % — ABNORMAL HIGH (ref 11.5–15.5)
WBC: 5.7 10*3/uL (ref 4.0–10.5)
nRBC: 0 % (ref 0.0–0.2)

## 2019-09-17 MED ORDER — SODIUM CHLORIDE 0.9 % IV SOLN
INTRAVENOUS | Status: DC | PRN
  Administered 2019-09-18: 22:00:00 250 mL via INTRAVENOUS

## 2019-09-17 NOTE — Progress Notes (Addendum)
14 Days Post-Op Procedure(s) (LRB): THORACOTOMY MAJOR FOR ESOPHAGEAL PERFORATION WITH INTERCOSTAL MUSCLE FLAP (Right) Subjective: Moved back to [facility] 2 days ago after suspected sepsis last week and subsequent transfer to ICU.  Sputum Cx showed normal flora, Blood Cx's pending.   Resting in bed, awakens easily, mental status appropriate. Complains of pain around right pleural tubes.   Objective: Vital signs in last 24 hours: Temp:  [97.9 F (36.6 C)-98.5 F (36.9 C)] 98.5 F (36.9 C) (12/07 1057) Pulse Rate:  [86-106] 98 (12/07 1057) Cardiac Rhythm: Normal sinus rhythm (12/07 0803) Resp:  [14-24] 16 (12/07 1057) BP: (114-132)/(80-97) 116/87 (12/07 1057) SpO2:  [95 %-100 %] 100 % (12/07 1057) Weight:  [70.6 kg] 70.6 kg (12/07 0336)    Intake/Output from previous day: 12/06 0701 - 12/07 0700 In: 3242.9 [P.O.:1; I.V.:1121.6; NG/GT:631.9; IV Piggyback:1488.4] Out: 1980 [Urine:1900; Chest Tube:80] Intake/Output this shift: Total I/O In: 1489.1 [I.V.:1209.6; IV Piggyback:279.5] Out: -   General appearance: alert, cooperative and mild distress Neurologic: intact Heart: ST 100-110. Lungs: Breath sounds are clear.  Abdomen: Soft, non-tender. G-J tube secured, site dry. Wound: The thorcotomy incision is dry and healing well. The chest tube insertions site have some serous fluid leaking around them.  Lab Results: Recent Labs    09/16/19 0210 09/17/19 0435  WBC 8.8 5.7  HGB 7.9* 8.1*  HCT 26.1* 26.5*  PLT 286 338   BMET:  Recent Labs    09/16/19 0210 09/17/19 0435  NA 140 139  K 3.1* 3.7  CL 109 107  CO2 24 25  GLUCOSE 92 102*  BUN 9 7*  CREATININE 0.56* 0.47*  CALCIUM 7.9* 8.0*    PT/INR: No results for input(s): LABPROT, INR in the last 72 hours. ABG    Component Value Date/Time   PHART 7.432 09/14/2019 1405   HCO3 24.1 09/14/2019 1405   TCO2 28 09/04/2019 1241   ACIDBASEDEF 6.0 (H) 09/04/2019 0522   O2SAT 95.4 09/14/2019 1405   CBG (last 3)   Recent Labs    09/17/19 0008 09/17/19 0440 09/17/19 0820  GLUCAP 114* 99 152*    Assessment/Plan: S/P Procedure(s) (LRB): THORACOTOMY MAJOR FOR ESOPHAGEAL PERFORATION WITH INTERCOSTAL MUSCLE FLAP (Right)    LOS: 14 days   -POD-14right thoracotomy, repair of esophogeal perforation with intercostal muscle flap. Afebrile and WBC remainsnormal. Vanc and Zosyn have been restarted on 12/4 after after becoming febrile and leukocytosis. Temp and WBC have again normalized. On day11IV fluconazole for candida cultured from pleural fluid at time of surgery. May continue ice chips while sitting up.   -Suspicion of sepsis after starting clear liquids po last week. Broad spectrum ABX re-started.  Sputum cx shows normal flora and blood Cx. Have not resulted. He is clinically doing better with stable BP.   -Nutrition- Previously placed PEG was converted to PEG-J tube early post-op and has reportedly stopped working.Tubogram by IR on 09/11/19 confirmed both lumens to patent. TF resumed and is infusing at goal rate of 60ml hr with no sign of obstruction. G-J tube was replaced this weekend after being pulled out inadvertently. I>>O past few days.  Will slow IVF to 43ml/hr.   -EtOH abuse-on detox protocol / Precedex early post-op. Now on Ativan po q6h and reasonably calm on morning rounds.Still not sleeping at night.QHSSeroquelstarted11/30 at 50mg  and increased to 100mg on 09/13/19.  -Mild anemia- Hct. Stable, continue to monitor.   -Hypokalemia-Replacement given, K+ 3.7 this AM. Repeat BMP in AM.   -H/O PE prior to surgery. On  full dose Lovenox 65mg  [place] q12H.   [name], PA-C [phone] 09/17/2019 Patient seen and examined, agree with above Needs Picc for better IV access  Repeat swallow later this week  [name], MD [facility] [phone]

## 2019-09-17 NOTE — Plan of Care (Signed)
  Problem: Education: Goal: Knowledge of General Education information will improve Description: Including pain rating scale, medication(s)/side effects and non-pharmacologic comfort measures Outcome: Progressing   Problem: Health Behavior/Discharge Planning: Goal: Ability to manage health-related needs will improve Outcome: Progressing   Problem: Clinical Measurements: Goal: Ability to maintain clinical measurements within normal limits will improve Outcome: Progressing Goal: Will remain free from infection Outcome: Progressing Goal: Diagnostic test results will improve Outcome: Progressing Goal: Respiratory complications will improve Outcome: Progressing Goal: Cardiovascular complication will be avoided Outcome: Progressing   Problem: Activity: Goal: Risk for activity intolerance will decrease Outcome: Progressing   Problem: Nutrition: Goal: Adequate nutrition will be maintained Outcome: Progressing   Problem: Coping: Goal: Level of anxiety will decrease Outcome: Progressing   Problem: Elimination: Goal: Will not experience complications related to bowel motility Outcome: Progressing Goal: Will not experience complications related to urinary retention Outcome: Progressing   Problem: Pain Managment: Goal: General experience of comfort will improve Outcome: Progressing   Problem: Safety: Goal: Ability to remain free from injury will improve Outcome: Progressing   Problem: Skin Integrity: Goal: Risk for impaired skin integrity will decrease Outcome: Progressing   Problem: Bowel/Gastric: Goal: Gastrointestinal status for postoperative course will improve Outcome: Progressing   Problem: Nutritional: Goal: Ability to achieve adequate nutritional intake will improve Outcome: Progressing   Problem: Clinical Measurements: Goal: Postoperative complications will be avoided or minimized Outcome: Progressing   Problem: Respiratory: Goal: Ability to maintain a  clear airway will improve Outcome: Progressing   Problem: Skin Integrity: Goal: Demonstration of wound healing without infection will improve Outcome: Progressing

## 2019-09-18 LAB — GLUCOSE, CAPILLARY
Glucose-Capillary: 91 mg/dL (ref 70–99)
Glucose-Capillary: 131 mg/dL — ABNORMAL HIGH (ref 70–99)
Glucose-Capillary: 111 mg/dL — ABNORMAL HIGH (ref 70–99)
Glucose-Capillary: 126 mg/dL — ABNORMAL HIGH (ref 70–99)
Glucose-Capillary: 113 mg/dL — ABNORMAL HIGH (ref 70–99)

## 2019-09-18 LAB — VANCOMYCIN, PEAK: Vancomycin Pk: 25 ug/mL — ABNORMAL LOW (ref 30–40)

## 2019-09-18 LAB — VANCOMYCIN, TROUGH: Vancomycin Tr: 10 ug/mL — ABNORMAL LOW (ref 15–20)

## 2019-09-18 MED ORDER — SODIUM CHLORIDE 0.9% FLUSH
10.0000 mL | Freq: Two times a day (BID) | INTRAVENOUS | Status: DC
  Administered 2019-09-18 (×2): 10 mL
  Administered 2019-09-19: 20 mL
  Administered 2019-09-19: 10 mL
  Administered 2019-09-20: 40 mL
  Administered 2019-09-20 – 2019-10-03 (×27): 10 mL
  Administered 2019-10-04 – 2019-10-05 (×2): 30 mL
  Administered 2019-10-05 – 2019-10-12 (×12): 10 mL
  Administered 2019-10-12: 40 mL
  Administered 2019-10-13 – 2019-10-15 (×5): 10 mL
  Administered 2019-10-15: 20 mL
  Administered 2019-10-16 – 2019-10-20 (×9): 10 mL
  Administered 2019-10-21: 20 mL
  Administered 2019-10-21 – 2019-10-24 (×5): 10 mL
  Administered 2019-10-24: 30 mL
  Administered 2019-10-25 – 2019-10-27 (×6): 10 mL
  Administered 2019-10-28: 40 mL
  Administered 2019-10-28 – 2019-11-03 (×11): 10 mL

## 2019-09-18 MED ORDER — SODIUM CHLORIDE 0.9% FLUSH
10.0000 mL | INTRAVENOUS | Status: DC | PRN
  Administered 2019-10-13: 10 mL

## 2019-09-18 MED ORDER — SODIUM CHLORIDE 0.9 % IV SOLN
INTRAVENOUS | Status: DC | PRN
  Administered 2019-09-18: 250 mL via INTRAVENOUS

## 2019-09-18 NOTE — Progress Notes (Signed)
Peripherally Inserted Central Catheter/Midline Placement  The IV Nurse has discussed with the patient and/or persons authorized to consent for the patient, the purpose of this procedure and the potential benefits and risks involved with this procedure.  The benefits include less needle sticks, lab draws from the catheter, and the patient may be discharged home with the catheter. Risks include, but not limited to, infection, bleeding, blood clot (thrombus formation), and puncture of an artery; nerve damage and irregular heartbeat and possibility to perform a PICC exchange if needed/ordered by physician.  Alternatives to this procedure were also discussed.  Bard Power PICC patient education guide, fact sheet on infection prevention and patient information card has been provided to patient /or left at bedside.    PICC/Midline Placement Documentation  PICC Double Lumen 95/09/32 PICC Right Basilic 6 cm 0 cm (Active)  Indication for Insertion or Continuance of Line Vasoactive infusions 09/18/19 0959  Exposed Catheter (cm) 0 cm 09/18/19 0959  Site Assessment Clean;Dry;Intact 09/18/19 0959  Lumen #1 Status Flushed;Saline locked;Blood return noted 09/18/19 0959  Lumen #2 Status Flushed;Blood return noted;Saline locked 09/18/19 0959  Dressing Type Transparent;Securing device 09/18/19 0959  Dressing Status Clean;Dry;Intact;Antimicrobial disc in place 09/18/19 0959  Dressing Change Due 09/25/19 09/18/19 0959       George Wade 09/18/2019, 10:05 AM

## 2019-09-18 NOTE — Progress Notes (Signed)
Nutrition Follow-up  RD working remotely.  DOCUMENTATION CODES:   Non-severe (moderate) malnutrition in context of social or environmental circumstances  INTERVENTION:   Continue tube feeding: - Osmolite 1.5 @ 60 ml/hr (1463m/day) via J-tube - Pro-stat 30 ml BID  Tube feeding regimen provides2360kcal, 120grams of protein, and 10972mof H2O.  NUTRITION DIAGNOSIS:   Moderate Malnutrition related to social / environmental circumstances (homeless, ETOH abuse) as evidenced by moderate muscle depletion, moderate fat depletion, mild fat depletion.  Ongoing, being addressed via TF  GOAL:   Patient will meet greater than or equal to 90% of their needs  Met via TF  MONITOR:   Weight trends, Labs, I & O's, TF tolerance, Skin  REASON FOR ASSESSMENT:   Malnutrition Screening Tool    ASSESSMENT:   Patient with PMH significant for ETOH abuse, non-hodgkin's lymphoma, tonsillar cancer, esophogeal stricture s/p PEG, and HTN. Presents this admission with esophageal perforation.  11/24 -rightthoracotomy, repair of perforation, extubated 11/25 - G-tube converted to G-J tube 11/29 - pt removed NG tube, G-J tube clogged 12/01 - G-J tube unclogged, swallow study showing residual distal esophageal stricture and no leak of contrast 12/02 - diet advanced to clear liquids 12/04 - NPO, G-J tube fell out, transferred to ICU for sepsis 12/05 - transferred back to progressive care, G-J tube replaced 12/08 - PICC line  Noted plan for repeat swallow study later this week "to see if any contrast gets past stricture" per CTS.  Weight trending up since last RD visit. Weight up 11 lbs x 1 week. Weight gain is favorable in a pt with malnutrition. Will continue to monitor trends.  Current TF: Osmolite 1.5 @ 60 ml/hr, Pro-stat 30 ml BID  Medications reviewed and include: folic acid, protonix, thiamine, IV abx IVF: D5 in 1/2NS @ 50 ml/hr  Labs reviewed: hemoglobin 8.1 CBG's: 91-131 x 24  hours  UOP: 3200 ml x 24 hours CT: 100 ml x 24 hours  Diet Order:   Diet Order            Diet NPO time specified  Diet effective now              EDUCATION NEEDS:   Not appropriate for education at this time  Skin:  Skin Assessment: Skin Integrity Issues: Skin Integrity Issues: Incisions: right chest  Last BM:  09/17/19 small type 7  Height:   Ht Readings from Last 1 Encounters:  09/04/19 _0  (1.727 m)    Weight:   Wt Readings from Last 1 Encounters:  09/18/19 69.3 kg    Ideal Body Weight:  70 kg  BMI:  Body mass index is 23.23 kg/m.  Estimated Nutritional Needs:   Kcal:  2200-2400 kcal  Protein:  110-125 grams  Fluid:  >/= 2.2 L/day    KaGaynell FaceMS, RD, LDN Inpatient Clinical Dietitian Pager: [phone]/After Hours: [phone]

## 2019-09-18 NOTE — Progress Notes (Signed)
Pharmacy Antibiotic Note  George Conry. is a 63 y.o. male admitted on 09/03/2019 with sepsis, possible source from scrotal/anal ulcers. Already on fluconazole for pleural candidiasis. No esophageal leak after procedure. Pharmacy has been consulted for vanc/piptazo dosing.  A Vancomycin peak today resulted as 25 mcg/ml (true 33 mcg/ml) and trough at 10 mcg/ml. The patient's calculated AUC is 450 and at goal. No adjustments are needed at this time.   Plan: - Continue Vancomycin 1g IV every 12 hours - Continue Zosyn 3.375g IV every 8 hours (infused over 4 hours) - Continue Fluconazole 400 mg IV every 24 hours - Will continue to follow renal function, culture results, LOT, and antibiotic de-escalation plans   Height: 5\' 8"  (172.7 cm) Weight: 152 lb 12.5 oz (69.3 kg) IBW/kg (Calculated) : 68.4  Temp (24hrs), Avg:98.3 F (36.8 C), Min:98.2 F (36.8 C), Max:98.6 F (37 C)  Recent Labs  Lab 09/12/19 1122 09/13/19 0340 09/14/19 0709 09/14/19 1450 09/14/19 1941 09/15/19 0317 09/16/19 0210 09/17/19 0435 09/18/19 1017 09/18/19 1506  WBC  --  8.8 23.7*  --   --  15.5* 8.8 5.7  --   --   CREATININE 0.65  --  0.71  --   --  0.58* 0.56* 0.47*  --   --   LATICACIDVEN  --   --   --  1.4 1.4  --   --   --   --   --   VANCOTROUGH  --   --   --   --   --   --   --   --  10*  --   VANCOPEAK  --   --   --   --   --   --   --   --   --  25*    Estimated Creatinine Clearance: 91.4 mL/min (A) (by C-G formula based on SCr of 0.47 mg/dL (L)).    Allergies  Allergen Reactions  . Sulfa Antibiotics Nausea Only    Antimicrobials this admission: Pip/tazo 11/23 >> 11/30; 12/4 >> Vanc 11/24 >> 11/28; 12/4 >> Fluconazole 11/27>>  Dose Adjustments: 12/8: VP 25, VT 10 - calc AUC 450 - cont current dose  Microbiology results: 12/4 BCx: ngtd 12/4 TA: nf 12/2 MRSA PCR: neg 11/24 Pleural: rare candida albicans 11/24 Tissue: rare candida, rare strep viridans  Thank you for allowing pharmacy  to be a part of this patient's care.  Alycia Rossetti, PharmD, BCPS Clinical Pharmacist Clinical phone for 09/18/2019: [phone] 09/18/2019 4:33 PM   **Pharmacist phone directory can now be found on [email or web address] ([email or web address]).  Listed under [place].

## 2019-09-18 NOTE — Progress Notes (Signed)
Physical Therapy Treatment Patient Details Name: George Wade. MRN: [id number] DOB: [birth year] Today's Date: 09/18/2019    History of Present Illness Pt is a 63 y.o. male admitted 09/03/19 with c/o R-side chest pain. CT showed R hydropneumothorax and extensive distal pneumomediastinum. Pt s/p R major thoracotomy for esophageal performation with intercostal muscle flap on 11/24. PMH includes alcohol abuse, non-Hodgkin's lymphoma, tonsillar cancer, HTN, PE, esophageal stricture. Of note, recent admission 07/2019-08/2019 for chest pain and alcohol-induced mood disorder.    PT Comments    Patient progressing well towards PT goals. Improved ambulation distance with Min guard for safety. Required 1 standing rest break and noted to have 2/4 DOE. VSS throughout on RA. Able to perform wash up sitting on EOB without assist. Continues to report pain in right flank area. Eager to get chest tube removed. Will continue to follow.   Follow Up Recommendations  SNF;Supervision for mobility/OOB     Equipment Recommendations  Rolling walker with 5" wheels    Recommendations for Other Services       Precautions / Restrictions Precautions Precautions: Fall Precaution Comments: Multiple lines, chest tubes; PEG Restrictions Weight Bearing Restrictions: No    Mobility  Bed Mobility Overal bed mobility: Needs Assistance Bed Mobility: Supine to Sit     Supine to sit: HOB elevated;Supervision     General bed mobility comments: Supervision for safety/line management.  Transfers Overall transfer level: Needs assistance Equipment used: Rolling walker (2 wheeled) Transfers: Sit to/from Stand Sit to Stand: Supervision         General transfer comment: Supervision for safety. Stood from [employer], from chair x1.  Ambulation/Gait Ambulation/Gait assistance: Min guard Gait Distance (Feet): 150 Feet Assistive device: Rolling walker (2 wheeled) Gait Pattern/deviations: Step-through  pattern;Decreased stride length Gait velocity: reduced   General Gait Details: Slow, mostly steady gait with RW for support; 2/4 DOE. VSS throughout wtih Sp02 >92% on RA. 1 standing rest break.   Stairs             Wheelchair Mobility    Modified Rankin (Stroke Patients Only)       Balance Overall balance assessment: Needs assistance Sitting-balance support: Feet supported;No upper extremity supported Sitting balance-Leahy Scale: Good Sitting balance - Comments: Able to perform bird bath on EOB without difficulty.   Standing balance support: During functional activity Standing balance-Leahy Scale: Fair                              Cognition Arousal/Alertness: Awake/alert Behavior During Therapy: WFL for tasks assessed/performed Overall Cognitive Status: Within Functional Limits for tasks assessed                                        Exercises      General Comments General comments (skin integrity, edema, etc.): VSS, on RA.      Pertinent Vitals/Pain Pain Assessment: Faces Faces Pain Scale: Hurts even more Pain Location: Rt flank, chest tube site Pain Descriptors / Indicators: Aching;Sore Pain Intervention(s): Repositioned;Premedicated before session;Monitored during session    Home Living                      Prior Function            PT Goals (current goals can now be found in the care plan section) Progress towards PT  goals: Progressing toward goals    Frequency    Min 3X/week      PT Plan Current plan remains appropriate    Co-evaluation              AM-PAC PT "6 Clicks" Mobility   Outcome Measure  Help needed turning from your back to your side while in a flat bed without using bedrails?: None Help needed moving from lying on your back to sitting on the side of a flat bed without using bedrails?: A Little Help needed moving to and from a bed to a chair (including a wheelchair)?: None Help  needed standing up from a chair using your arms (e.g., wheelchair or bedside chair)?: None Help needed to walk in hospital room?: A Little Help needed climbing 3-5 steps with a railing? : A Lot 6 Click Score: 20    End of Session   Activity Tolerance: Patient tolerated treatment well Patient left: in bed;with call bell/phone within reach;with bed alarm set(sitting EOB waiting for bath, RN notified) Nurse Communication: Mobility status PT Visit Diagnosis: Other abnormalities of gait and mobility (R26.89);Muscle weakness (generalized) (M62.81);Pain Pain - Right/Left: Right Pain - part of body: (flank)     Time: 1470-9295 PT Time Calculation (min) (ACUTE ONLY): 33 min  Charges:  $Gait Training: 8-22 mins $Therapeutic Activity: 8-22 mins                     Marisa Severin, PT, DPT Acute Rehabilitation Services Pager [phone] Office [place] 09/18/2019, 2:36 PM

## 2019-09-18 NOTE — Progress Notes (Addendum)
15 Days Post-Op Procedure(s) (LRB): THORACOTOMY MAJOR FOR ESOPHAGEAL PERFORATION WITH INTERCOSTAL MUSCLE FLAP (Right) Subjective: Awake and alert. Says he is having less pain in his right side.  BM x1 yesterday.   Objective: Vital signs in last 24 hours: Temp:  [97.9 F (36.6 C)-98.6 F (37 C)] 98.3 F (36.8 C) (12/08 0300) Pulse Rate:  [86-108] 108 (12/08 0300) Cardiac Rhythm: Sinus tachycardia (12/08 0300) Resp:  [16-22] 21 (12/08 0300) BP: (116-132)/(85-91) 124/88 (12/08 0300) SpO2:  [95 %-100 %] 95 % (12/08 0300) Weight:  [69.3 kg] 69.3 kg (12/08 0300)   Intake/Output from previous day: 12/07 0701 - 12/08 0700 In: 5327.9 [I.V.:2875.2; NG/GT:1259.1; IV Piggyback:1013.5] Out: 3300 [Urine:3200; Chest Tube:100] Intake/Output this shift: No intake/output data recorded.  Physical Exam: General appearance: alert, cooperative and mild distress Neurologic: intact Heart: ST 100-110. Lungs: Breath sounds are clear.  Abdomen: Soft, non-tender. G-J tube secured, site dry. Wound: The thorocotomy incision is dry and healing well. The chest tube insertions site have some serous fluid leaking around them.  Lab Results: Recent Labs    09/16/19 0210 09/17/19 0435  WBC 8.8 5.7  HGB 7.9* 8.1*  HCT 26.1* 26.5*  PLT 286 338   BMET:  Recent Labs    09/16/19 0210 09/17/19 0435  NA 140 139  K 3.1* 3.7  CL 109 107  CO2 24 25  GLUCOSE 92 102*  BUN 9 7*  CREATININE 0.56* 0.47*  CALCIUM 7.9* 8.0*    PT/INR: No results for input(s): LABPROT, INR in the last 72 hours. ABG    Component Value Date/Time   PHART 7.432 09/14/2019 1405   HCO3 24.1 09/14/2019 1405   TCO2 28 09/04/2019 1241   ACIDBASEDEF 6.0 (H) 09/04/2019 0522   O2SAT 95.4 09/14/2019 1405   CBG (last 3)  Recent Labs    09/17/19 1951 09/17/19 2317 09/18/19 0327  GLUCAP 120* 115* 91    Assessment/Plan: S/P Procedure(s) (LRB): THORACOTOMY MAJOR FOR ESOPHAGEAL PERFORATION WITH INTERCOSTAL MUSCLE FLAP  (Right)  -POD-15right thoracotomy, repair of esophogeal perforation with intercostal muscle flap. Afebrile and WBC remainsnormal. Vanc and Zosyn were restarted on 12/4 after after becoming febrile and leukocytosis. Temp and WBC have again normalized. On day12IV fluconazole for candida cultured from pleural fluid at time of surgery. May continue ice chips while sitting up.  PICC line planned. Plan repeat swallow study later this week. Would continue broad spectrum ABX coverage until after swallow study.   -Nutrition- Previously placed PEG was converted to PEG-J tube early post-op and has reportedly stopped working.Tubogram by IR on 09/11/19 confirmed both lumens to be patent. TF resumed and is infusing at goal rate of 49ml hr with no sign of obstruction. G-J tube was replaced this weekend after being pulled out inadvertently.  Will slow IVF to 42ml/hr.   -EtOH abuse-on detox protocol / Precedex early post-op. Now calm and cooperative.  Continue Seroquel  -Mild anemia- Hct. Trending up, continue to monitor.   -Hypokalemia-Replacement given, K+ 3.7 12/7. Monitor  -H/O PE prior to surgery. On full dose Lovenox 65mg  [place] q12H.    LOS: 15 days    [name], [place] [phone] 09/18/2019 Patient seen and examined, agree with above Will plan to finish out 1 week of IV antibiotics which will finish Thursday, will also have completed 2 weeks of Diflucan at that point. Repeat swallow later this week to see if any contrast gets past stricture  [name], MD [facility] [phone]

## 2019-09-19 ENCOUNTER — Inpatient Hospital Stay (HOSPITAL_COMMUNITY): Payer: Self-pay

## 2019-09-19 LAB — GLUCOSE, CAPILLARY
Glucose-Capillary: 80 mg/dL (ref 70–99)
Glucose-Capillary: 95 mg/dL (ref 70–99)
Glucose-Capillary: 117 mg/dL — ABNORMAL HIGH (ref 70–99)
Glucose-Capillary: 96 mg/dL (ref 70–99)
Glucose-Capillary: 101 mg/dL — ABNORMAL HIGH (ref 70–99)
Glucose-Capillary: 100 mg/dL — ABNORMAL HIGH (ref 70–99)

## 2019-09-19 LAB — CBC
HCT: 30.3 % — ABNORMAL LOW (ref 39.0–52.0)
Hemoglobin: 8.9 g/dL — ABNORMAL LOW (ref 13.0–17.0)
MCH: 26.3 pg (ref 26.0–34.0)
MCHC: 29.4 g/dL — ABNORMAL LOW (ref 30.0–36.0)
MCV: 89.4 fL (ref 80.0–100.0)
Platelets: 514 K/uL — ABNORMAL HIGH (ref 150–400)
RBC: 3.39 MIL/uL — ABNORMAL LOW (ref 4.22–5.81)
RDW: 19.3 % — ABNORMAL HIGH (ref 11.5–15.5)
WBC: 5.8 K/uL (ref 4.0–10.5)
nRBC: 0 % (ref 0.0–0.2)

## 2019-09-19 LAB — BASIC METABOLIC PANEL
Anion gap: 9 (ref 5–15)
BUN: 10 mg/dL (ref 8–23)
CO2: 25 mmol/L (ref 22–32)
Calcium: 8.5 mg/dL — ABNORMAL LOW (ref 8.9–10.3)
Chloride: 108 mmol/L (ref 98–111)
Creatinine, Ser: 0.51 mg/dL — ABNORMAL LOW (ref 0.61–1.24)
GFR calc Af Amer: >60 mL/min (ref >60)
GFR calc non Af Amer: >60 mL/min (ref >60)
Glucose, Bld: 96 mg/dL (ref 70–99)
Potassium: 4.1 mmol/L (ref 3.5–5.1)
Sodium: 142 mmol/L (ref 135–145)

## 2019-09-19 LAB — CULTURE, BLOOD (ROUTINE X 2)
Culture: NO GROWTH
Culture: NO GROWTH
Special Requests: ADEQUATE
Special Requests: ADEQUATE

## 2019-09-19 MED ORDER — OXYCODONE HCL 5 MG/5ML PO SOLN
7.5000 mg | ORAL | Status: DC | PRN
  Administered 2019-09-19 – 2019-09-21 (×10): 7.5 mg
  Filled 2019-09-19 (×11): qty 10

## 2019-09-19 NOTE — Progress Notes (Addendum)
16 Days Post-Op Procedure(s) (LRB): THORACOTOMY MAJOR FOR ESOPHAGEAL PERFORATION WITH INTERCOSTAL MUSCLE FLAP (Right) Subjective: Awake and alert, compllains of worsening soreness at chest tube insertion sites. Otherwise feels good.   Objective: Vital signs in last 24 hours: Temp:  [98.1 F (36.7 C)-98.6 F (37 C)] 98.1 F (36.7 C) (12/09 0729) Pulse Rate:  [88-108] 96 (12/09 0729) Cardiac Rhythm: Sinus tachycardia (12/09 0752) Resp:  [16-26] 19 (12/09 0729) BP: (111-130)/(89-102) 118/89 (12/09 0729) SpO2:  [97 %-100 %] 98 % (12/09 0729) Weight:  [69.1 kg] 69.1 kg (12/09 0429)     Intake/Output from previous day: 12/08 0701 - 12/09 0700 In: 2698.5 [I.V.:990.1; NG/GT:1020; IV Piggyback:508.4] Out: 2365 [Urine:2275; Chest Tube:90] Intake/Output this shift: No intake/output data recorded.  Physical Exam: General appearance:alert, cooperative and mild distress Neurologic:intact Heart:ST 100-110. Lungs:Breath sounds are clear. Abdomen:Soft, non-tender. G-J tube secured, site dry. Wound:The thorocotomy incision is dry and healing well.   Lab Results: Recent Labs    09/17/19 0435 09/19/19 0445  WBC 5.7 5.8  HGB 8.1* 8.9*  HCT 26.5* 30.3*  PLT 338 514*   BMET:  Recent Labs    09/17/19 0435 09/19/19 0445  NA 139 142  K 3.7 4.1  CL 107 108  CO2 25 25  GLUCOSE 102* 96  BUN 7* 10  CREATININE 0.47* 0.51*  CALCIUM 8.0* 8.5*    PT/INR: No results for input(s): LABPROT, INR in the last 72 hours. ABG    Component Value Date/Time   PHART 7.432 09/14/2019 1405   HCO3 24.1 09/14/2019 1405   TCO2 28 09/04/2019 1241   ACIDBASEDEF 6.0 (H) 09/04/2019 0522   O2SAT 95.4 09/14/2019 1405   CBG (last 3)  Recent Labs    09/18/19 2044 09/19/19 0210 09/19/19 0436  GLUCAP 113* 80 95    Assessment/Plan: S/P Procedure(s) (LRB): THORACOTOMY MAJOR FOR ESOPHAGEAL PERFORATION WITH INTERCOSTAL MUSCLE FLAP (Right)  -POD-16right thoracotomy, repair of esophogeal  perforation with intercostal muscle flap. Afebrile and WBC remainsnormal. Vanc and Zosyn wererestarted on 12/4 after after becoming febrile and leukocytosis.Temp and WBC have again normalized.On day13 of 14IV fluconazole for candida cultured from pleural fluid at time of surgery. May continueice chipswhile sitting up.  PICC placed 12//9 Plan repeat swallow study tomorrow.  Plan to continue broad spectrum ABX coverage through tomorrow.  -Nutrition-  TF is infusing at goal rateof 51ml hrwith no sign of obstruction. G-J tube was replaced this weekend after being pulled out inadvertently. Will slow IVF to 39ml/hr.   -EtOH abuse-on detox protocol / Precedex early post-op. Now calm and cooperative.  Continue Seroquel  -Mild anemia- Hct. Trending up, continue to monitor.   -Hypokalemia-Replacement given, K+ 4.1 12/9.Monitor  -H/O PE prior to surgery. On full dose Lovenox 65mg  [place] q12H.    LOS: 16 days    [name], PA-C 09/19/2019 Patient seen and examined, agree with above CXR stable Will repeat swallow tomorrow to see if any contrast passes stricture Will complete antibiotics tomorrow  [name], MD [facility] [phone]

## 2019-09-19 NOTE — Progress Notes (Signed)
Occupational Therapy Treatment Patient Details Name: George Wade. MRN: [id number] DOB: 04-05-1956 Today's Date: 09/19/2019    History of present illness Pt is a 63 y.o. male admitted 09/03/19 with c/o R-side chest pain. CT showed R hydropneumothorax and extensive distal pneumomediastinum. Pt s/p R major thoracotomy for esophageal performation with intercostal muscle flap on 11/24. PMH includes alcohol abuse, non-Hodgkin's lymphoma, tonsillar cancer, HTN, PE, esophageal stricture. Of note, recent admission 07/2019-08/2019 for chest pain and alcohol-induced mood disorder.   OT comments  Patient progressing slowly towards OT goals. Completing transfers with min guard assist for safety, grooming standing at sink with min guard assist and LB dressing with min assist.  Patient SpO2 ranged from mid 80s-90s during session, but pt asymptomatic and poor waveform.  He is reliant on suction throughout session, anxious with line/tube mgmt and having suction nearby.  Encouraged OOB at least 2x/day, educated on benefits of OOB activity--pt agreeable.  Demonstrated use of incentive spirometer with independence.  Will follow acutely. DC plan remains appropriate.    Follow Up Recommendations  SNF;Supervision/Assistance - 24 hour    Equipment Recommendations  Other (comment)(TBD at next venue of care)    Recommendations for Other Services      Precautions / Restrictions Precautions Precautions: Fall Precaution Comments: Multiple lines, chest tubes; PEG Restrictions Weight Bearing Restrictions: No       Mobility Bed Mobility Overal bed mobility: Needs Assistance Bed Mobility: Supine to Sit     Supine to sit: Supervision;HOB elevated     General bed mobility comments: supervision for safety, lines   Transfers Overall transfer level: Needs assistance Equipment used: Rolling walker (2 wheeled) Transfers: Sit to/from Stand Sit to Stand: Min guard         General transfer comment: min  guard for safety and balance, cueing for hand placement; assist for line mgmt     Balance Overall balance assessment: Needs assistance Sitting-balance support: Feet supported;No upper extremity supported Sitting balance-Leahy Scale: Good     Standing balance support: Bilateral upper extremity supported;No upper extremity supported;During functional activity Standing balance-Leahy Scale: Fair Standing balance comment: preference to UE support in standing, able to groom at sink with min guard and 0 hand support                           ADL either performed or assessed with clinical judgement   ADL Overall ADL's : Needs assistance/impaired Eating/Feeding: NPO   Grooming: Min guard;Standing;Wash/dry hands;Wash/dry face               Lower Body Dressing: Minimal assistance;Sit to/from stand Lower Body Dressing Details (indicate cue type and reason): pt able to complete figure 4 technique to manage socks, min guard sit to stand; anticipate min assist to manage clothing over hips in standing  Toilet Transfer: Min guard;Ambulation;RW Toilet Transfer Details (indicate cue type and reason): simulated to recliner          Functional mobility during ADLs: Min guard;Rolling walker       Vision       Perception     Praxis      Cognition Arousal/Alertness: Awake/alert Behavior During Therapy: Anxious Overall Cognitive Status: Within Functional Limits for tasks assessed                                 General Comments: appears WFL, able to follow commands and  engage appropriately; pt anxious with use of suction (needing it close by) and from lines/tubes        Exercises     Shoulder Instructions       General Comments VSS, SpO2 flucutates between mid 80s to 90s (but pt asymptomatic and pleuth unreliable)     Pertinent Vitals/ Pain       Pain Assessment: Faces Faces Pain Scale: Hurts even more Pain Location: Rt flank, chest tube site Pain  Descriptors / Indicators: Aching;Sore Pain Intervention(s): Monitored during session;Repositioned  Home Living                                          Prior Functioning/Environment              Frequency  Min 2X/week        Progress Toward Goals  OT Goals(current goals can now be found in the care plan section)  Progress towards OT goals: Progressing toward goals  Acute Rehab OT Goals Patient Stated Goal: to go to rehab and get stronger OT Goal Formulation: With patient  Plan Discharge plan remains appropriate;Frequency remains appropriate    Co-evaluation                 AM-PAC OT "6 Clicks" Daily Activity     Outcome Measure   Help from another person eating meals?: Total(NPO) Help from another person taking care of personal grooming?: A Little Help from another person toileting, which includes using toliet, bedpan, or urinal?: A Lot Help from another person bathing (including washing, rinsing, drying)?: A Lot Help from another person to put on and taking off regular upper body clothing?: A Little Help from another person to put on and taking off regular lower body clothing?: A Little 6 Click Score: 14    End of Session Equipment Utilized During Treatment: Rolling walker  OT Visit Diagnosis: Unsteadiness on feet (R26.81);Other abnormalities of gait and mobility (R26.89);Muscle weakness (generalized) (M62.81);Pain Pain - Right/Left: Right Pain - part of body: (flank, chest tube site)   Activity Tolerance Patient tolerated treatment well   Patient Left in chair;with call bell/phone within reach;with chair alarm set   Nurse Communication Mobility status;Precautions        Time: 8453-6468 OT Time Calculation (min): 31 min  Charges: OT General Charges $OT Visit: 1 Visit OT Treatments $Self Care/Home Management : 23-37 mins  Delight Stare, [place] Pager [phone] Office  [phone]    Delight Stare 09/19/2019, 2:37 PM

## 2019-09-19 NOTE — Progress Notes (Signed)
Physical Therapy Treatment Patient Details Name: George Wade. MRN: [id number] DOB: 30-Jul-1956 Today's Date: 09/19/2019    History of Present Illness Pt is a 63 y.o. male admitted 09/03/19 with c/o R-side chest pain. CT showed R hydropneumothorax and extensive distal pneumomediastinum. Pt s/p R major thoracotomy for esophageal performation with intercostal muscle flap on 11/24. PMH includes alcohol abuse, non-Hodgkin's lymphoma, tonsillar cancer, HTN, PE, esophageal stricture. Of note, recent admission 07/2019-08/2019 for chest pain and alcohol-induced mood disorder.    PT Comments    Patient is making progress toward PT goals and tolerated increased gait distance well. VSS on RA. Pt with painful chest tube site but reports feeling well today. Continue to progress as tolerated.    Follow Up Recommendations  SNF;Supervision for mobility/OOB     Equipment Recommendations  Rolling walker with 5" wheels    Recommendations for Other Services       Precautions / Restrictions Precautions Precautions: Fall Precaution Comments: Multiple lines, chest tubes; PEG Restrictions Weight Bearing Restrictions: No    Mobility  Bed Mobility Overal bed mobility: Modified Independent Bed Mobility: Sit to Supine     Supine to sit: Supervision;HOB elevated     General bed mobility comments: assist with lines but no physical assist needed   Transfers Overall transfer level: Needs assistance Equipment used: Rolling walker (2 wheeled) Transfers: Sit to/from Stand Sit to Stand: Min guard         General transfer comment: min guard for safety  Ambulation/Gait Ambulation/Gait assistance: Min guard;Supervision Gait Distance (Feet): 300 Feet Assistive device: Rolling walker (2 wheeled) Gait Pattern/deviations: Step-through pattern;Decreased stride length     General Gait Details: slow, guarded movement but steady overall   Stairs             Wheelchair Mobility     Modified Rankin (Stroke Patients Only)       Balance Overall balance assessment: Needs assistance Sitting-balance support: Feet supported;No upper extremity supported Sitting balance-Leahy Scale: Good     Standing balance support: Bilateral upper extremity supported;No upper extremity supported;During functional activity Standing balance-Leahy Scale: Fair Standing balance comment: preference to UE support in standing, able to groom at sink with min guard and 0 hand support                            Cognition Arousal/Alertness: Awake/alert Behavior During Therapy: Anxious Overall Cognitive Status: Within Functional Limits for tasks assessed                                 General Comments: appears WFL, able to follow commands and engage appropriately; pt anxious with use of suction (needing it close by) and from lines/tubes      Exercises      General Comments General comments (skin integrity, edema, etc.): VSS on RA       Pertinent Vitals/Pain Pain Assessment: Faces Faces Pain Scale: Hurts little more Pain Location: Rt flank, chest tube site Pain Descriptors / Indicators: Aching;Sore Pain Intervention(s): Limited activity within patient's tolerance;Monitored during session;Repositioned    Home Living                      Prior Function            PT Goals (current goals can now be found in the care plan section) Acute Rehab PT Goals Patient Stated Goal:  to go to rehab and get stronger Progress towards PT goals: Progressing toward goals    Frequency    Min 3X/week      PT Plan Current plan remains appropriate    Co-evaluation              AM-PAC PT "6 Clicks" Mobility   Outcome Measure  Help needed turning from your back to your side while in a flat bed without using bedrails?: None Help needed moving from lying on your back to sitting on the side of a flat bed without using bedrails?: A Little Help needed  moving to and from a bed to a chair (including a wheelchair)?: None Help needed standing up from a chair using your arms (e.g., wheelchair or bedside chair)?: None Help needed to walk in hospital room?: A Little Help needed climbing 3-5 steps with a railing? : A Little 6 Click Score: 21    End of Session   Activity Tolerance: Patient tolerated treatment well Patient left: in bed;with call bell/phone within reach;with nursing/sitter in room Nurse Communication: Mobility status PT Visit Diagnosis: Other abnormalities of gait and mobility (R26.89);Muscle weakness (generalized) (M62.81);Pain Pain - Right/Left: Right     Time: 1455-1534 PT Time Calculation (min) (ACUTE ONLY): 39 min  Charges:  $Gait Training: 23-37 mins                     Earney Navy, PTA Acute Rehabilitation Services Pager: [phone] Office: [phone]     Darliss Cheney 09/19/2019, 5:13 PM

## 2019-09-19 NOTE — Plan of Care (Signed)
  Problem: Education: Goal: Knowledge of General Education information will improve Description: Including pain rating scale, medication(s)/side effects and non-pharmacologic comfort measures Outcome: Progressing   Problem: Health Behavior/Discharge Planning: Goal: Ability to manage health-related needs will improve Outcome: Progressing   Problem: Clinical Measurements: Goal: Ability to maintain clinical measurements within normal limits will improve Outcome: Progressing Goal: Will remain free from infection Outcome: Progressing Goal: Diagnostic test results will improve Outcome: Progressing Goal: Respiratory complications will improve Outcome: Progressing Goal: Cardiovascular complication will be avoided Outcome: Progressing   Problem: Activity: Goal: Risk for activity intolerance will decrease Outcome: Progressing   Problem: Nutrition: Goal: Adequate nutrition will be maintained Outcome: Progressing   Problem: Coping: Goal: Level of anxiety will decrease Outcome: Progressing   Problem: Elimination: Goal: Will not experience complications related to bowel motility Outcome: Progressing Goal: Will not experience complications related to urinary retention Outcome: Progressing   Problem: Pain Managment: Goal: General experience of comfort will improve Outcome: Progressing   Problem: Safety: Goal: Ability to remain free from injury will improve Outcome: Progressing   Problem: Skin Integrity: Goal: Risk for impaired skin integrity will decrease Outcome: Progressing   Problem: Bowel/Gastric: Goal: Gastrointestinal status for postoperative course will improve Outcome: Progressing   Problem: Nutritional: Goal: Ability to achieve adequate nutritional intake will improve Outcome: Progressing   Problem: Clinical Measurements: Goal: Postoperative complications will be avoided or minimized Outcome: Progressing   Problem: Clinical Measurements: Goal: Postoperative  complications will be avoided or minimized Outcome: Progressing   Problem: Respiratory: Goal: Ability to maintain a clear airway will improve Outcome: Progressing   Problem: Skin Integrity: Goal: Demonstration of wound healing without infection will improve Outcome: Progressing

## 2019-09-20 ENCOUNTER — Inpatient Hospital Stay (HOSPITAL_COMMUNITY): Payer: Self-pay

## 2019-09-20 LAB — GLUCOSE, CAPILLARY
Glucose-Capillary: 102 mg/dL — ABNORMAL HIGH (ref 70–99)
Glucose-Capillary: 117 mg/dL — ABNORMAL HIGH (ref 70–99)
Glucose-Capillary: 123 mg/dL — ABNORMAL HIGH (ref 70–99)
Glucose-Capillary: 149 mg/dL — ABNORMAL HIGH (ref 70–99)
Glucose-Capillary: 88 mg/dL (ref 70–99)
Glucose-Capillary: 96 mg/dL (ref 70–99)

## 2019-09-20 MED ORDER — PANTOPRAZOLE SODIUM 40 MG IV SOLR
40.0000 mg | Freq: Two times a day (BID) | INTRAVENOUS | Status: DC
  Administered 2019-09-20 – 2019-11-02 (×86): 40 mg via INTRAVENOUS
  Filled 2019-09-20 (×86): qty 40

## 2019-09-20 NOTE — Consult Note (Signed)
Referring Provider: Dr. [name] Primary Care Physician:  Patient, [name] Per Primary Gastroenterologist: Unassigned/followed by [facility]  Reason for Consultation: Esophageal stricture  HPI: George Wade. is a 63 y.o. male with past medical history of non-Hodgkin's lymphoma, tonsillar cancer, history of PE, h/o  severe esophagitis complicated by esophageal stricture , had  dilation with through-the-scope balloon up to 10 mm by Dr. Allen Norris on 08/02/2019.  Patient presented to hospital September 03, 2019 with acute onset of chest pain and abdominal pain.  CT scan showed right hydropneumothorax and extensive pneumomediastinum.  Was subsequently diagnosed with [place]/esophageal rupture.  Underwent primary repair of esophageal perforation with intercostal muscle flap on September 04, 2019.  Patient has PEG tube for feeding.  Barium swallow today showed severe distal esophageal stricture with minimal passage of contrast.  GI is consulted for evaluation for possible dilation of lower esophageal stricture.  Patient seen and examined at bedside.  He continues to have trouble swallowing.  He denies any abdominal pain.  He is able to tolerate liquids by mouth.  Past Medical History:  Diagnosis Date  . Alcoholism with psychosis with complication, with hallucinations ([place])   . ED (erectile dysfunction)   . Fatigue   . History of pneumonia   . Hypercholesteremia   . Hypertension   . Lymphoma ([place]) 2008   non hodgkins lymphoma  . Rectal bleeding   . 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[facility])     Past Surgical History:  Procedure Laterality Date  . ESOPHAGOGASTRODUODENOSCOPY N/A 05/18/2019   Procedure: ESOPHAGOGASTRODUODENOSCOPY (EGD);  Surgeon: Lin Landsman, MD;  Location: [facility] ENDOSCOPY;  Service: Gastroenterology;  Laterality: N/A;  . ESOPHAGOGASTRODUODENOSCOPY (EGD) WITH PROPOFOL N/A 08/02/2019   Procedure: ESOPHAGOGASTRODUODENOSCOPY (EGD) WITH PROPOFOL;  Surgeon:  [name], MD;  Location: [facility] ENDOSCOPY;  Service: Endoscopy;  Laterality: N/A;  . [place]  . IR CM INJ ANY COLONIC TUBE W/FLUORO  09/11/2019  . IR GASTR TUBE CONVERT GASTR-JEJ PER W/FL MOD SED  09/05/2019  . IR GASTROSTOMY TUBE MOD SED  08/06/2019  . IR GASTROSTOMY TUBE MOD SED  08/17/2019  . IR REPLC GASTRO/COLONIC TUBE PERCUT W/FLUORO  09/15/2019  . JOINT REPLACEMENT    . REPLACEMENT TOTAL KNEE BILATERAL  2009  . THORACOTOMY Right 09/03/2019   Procedure: THORACOTOMY MAJOR FOR ESOPHAGEAL PERFORATION WITH INTERCOSTAL MUSCLE FLAP;  Surgeon: Melrose Nakayama, MD;  Location: [place];  Service: Thoracic;  Laterality: Right;  . TONSILLECTOMY  2008   with neck dissection     Prior to Admission medications   Medication Sig Start Date End Date Taking? Authorizing Provider  acetaminophen (TYLENOL) 325 MG tablet Take 2 tablets (650 mg total) by mouth every 6 (six) hours as needed for mild pain (or Fever >/= 101). 08/29/19   Ezekiel Slocumb, DO  apixaban (ELIQUIS) 5 MG TABS tablet Place 1 tablet (5 mg total) into feeding tube 2 (two) times daily. 08/29/19   Ezekiel Slocumb, DO  folic acid (FOLVITE) 1 MG tablet Place 1 tablet (1 mg total) into feeding tube daily. 08/30/19   Nicole Kindred A, DO  hydrocortisone cream 1 % Apply topically every 6 (six) hours as needed for itching. 08/29/19   Ezekiel Slocumb, DO  Nutritional Supplements (FEEDING SUPPLEMENT, OSMOLITE 1.5 CAL,) LIQD Place 474 mLs into feeding tube 3 (three) times daily. 08/29/19 09/28/19  Ezekiel Slocumb, DO  omeprazole (PRILOSEC) 20 MG capsule Take 1 capsule (20 mg total)  by mouth 2 (two) times daily before a meal. Open capsule and empty granules into small amount of liquid.  Give by PEG tube twice daily. 08/29/19 09/28/19  Nicole Kindred A, DO  thiamine 100 MG tablet Place 1 tablet (100 mg total) into feeding tube daily. 08/30/19   Ezekiel Slocumb, DO  Water For Irrigation, Sterile (FREE WATER) SOLN Place 100 mLs  into feeding tube every 4 (four) hours. 08/29/19   Ezekiel Slocumb, DO    Scheduled Meds: . acetaminophen (TYLENOL) oral liquid 160 mg/5 mL  650 mg Per Tube Q6H  . chlorhexidine  15 mL Mouth Rinse BID  . Chlorhexidine Gluconate Cloth  6 each Topical Daily  . enoxaparin (LOVENOX) injection  1 mg/kg (Adjusted) Subcutaneous Q12H  . feeding supplement (PRO-STAT SUGAR FREE 64)  30 mL Per Tube BID  . folic acid  1 mg Intravenous Daily  . mouth rinse  15 mL Mouth Rinse q12n4p  . pantoprazole (PROTONIX) IV  40 mg Intravenous Q24H  . QUEtiapine  100 mg Per J Tube QHS  . sodium chloride flush  10-40 mL Intracatheter Q12H  . thiamine injection  100 mg Intravenous Daily   Continuous Infusions: . sodium chloride Stopped (09/18/19 2203)  . sodium chloride 10 mL/hr at 09/19/19 0000  . dextrose 5 % and 0.45% NaCl 50 mL/hr at 09/19/19 1900  . feeding supplement (OSMOLITE 1.5 CAL) 1,000 mL (09/20/19 0939)  . fluconazole (DIFLUCAN) IV 100 mL/hr at 09/19/19 1900  . norepinephrine (LEVOPHED) Adult infusion Stopped (09/14/19 1951)  . piperacillin-tazobactam (ZOSYN)  IV 3.375 g (09/20/19 0600)  . vancomycin 1,000 mg (09/20/19 1200)   PRN Meds:.sodium chloride, sodium chloride, acetaminophen, guaiFENesin, hydrocortisone cream, ipratropium-albuterol, LORazepam, morphine injection, ondansetron (ZOFRAN) IV, oxyCODONE, sodium chloride flush  Allergies as of 09/03/2019 - Review Complete 09/03/2019  Allergen Reaction Noted  . Sulfa antibiotics Nausea Only 08/30/2018    Family History  Problem Relation Age of Onset  . Cancer Mother   . Hypertension Father   . Cancer Paternal Grandmother     Social History   Socioeconomic History  . Marital status: Divorced    Spouse name: Not on file  . Number of children: Not on file  . Years of education: Not on file  . Highest education level: Not on file  Occupational History  . Not on file  Tobacco Use  . Smoking status: Never Smoker  . Smokeless tobacco:  Never Used  Substance and Sexual Activity  . Alcohol use: Yes    Alcohol/week: 14.0 standard drinks    Types: 14 Glasses of wine per week    Comment: last yesterday  . Drug use: No  . Sexual activity: Not on file  Other Topics Concern  . Not on file  Social History Narrative  . Not on file   Social Determinants of Health   Financial Resource Strain:   . Difficulty of Paying Living Expenses: Not on file  Food Insecurity:   . Worried About Charity fundraiser in the Last Year: Not on file  . Ran Out of Food in the Last Year: Not on file  Transportation Needs:   . Lack of Transportation (Medical): Not on file  . Lack of Transportation (Non-Medical): Not on file  Physical Activity:   . Days of Exercise per Week: Not on file  . Minutes of Exercise per Session: Not on file  Stress:   . Feeling of Stress : Not on file  Social Connections:   .  Frequency of Communication with Friends and Family: Not on file  . Frequency of Social Gatherings with Friends and Family: Not on file  . Attends Religious Services: Not on file  . Active Member of Clubs or Organizations: Not on file  . Attends Archivist Meetings: Not on file  . Marital Status: Not on file  Intimate Partner Violence:   . Fear of Current or Ex-Partner: Not on file  . Emotionally Abused: Not on file  . Physically Abused: Not on file  . Sexually Abused: Not on file    Review of Systems: Review of Systems  Constitutional: Positive for malaise/fatigue. Negative for chills and fever.  HENT: Negative for hearing loss and tinnitus.   Eyes: Negative for blurred vision and double vision.  Respiratory: Negative for cough and hemoptysis.   Cardiovascular: Positive for chest pain (Soreness at chest tube site). Negative for palpitations.  Gastrointestinal: Positive for nausea. Negative for abdominal pain, blood in stool and vomiting.  Genitourinary: Negative for dysuria and urgency.  Musculoskeletal: Positive for back  pain and joint pain.  Skin: Negative for itching and rash.  Neurological: Negative for seizures and loss of consciousness.  Endo/Heme/Allergies: Does not bruise/bleed easily.  Psychiatric/Behavioral: Negative for depression. The patient is not nervous/anxious.     Physical Exam: Vital signs: Vitals:   09/20/19 0724 09/20/19 1040  BP: 130/85   Pulse: 93   Resp: (!) 22   Temp: 98.5 F (36.9 C) 98.3 F (36.8 C)  SpO2: 97%    Last BM Date: 09/17/19 Physical Exam  Constitutional: He is oriented to person, place, and time. He appears well-developed and well-nourished.  HENT:  Head: Normocephalic and atraumatic.  Mouth/Throat: No oropharyngeal exudate.  Eyes: EOM are normal. No scleral icterus.  Cardiovascular: Normal rate and regular rhythm.  Chest tube in place  Pulmonary/Chest: Effort normal. No respiratory distress. He exhibits tenderness.  Abdominal: Soft. Bowel sounds are normal. He exhibits no distension. There is no abdominal tenderness. There is no rebound and no guarding.  Musculoskeletal:        General: No edema. Normal range of motion.     Cervical back: Normal range of motion and neck supple.  Neurological: He is alert and oriented to person, place, and time.  Skin: Skin is warm. No erythema.  Psychiatric: He has a normal mood and affect. His behavior is normal. Judgment and thought content normal.  Vitals reviewed.   GI:  Lab Results: Recent Labs    09/19/19 0445  WBC 5.8  HGB 8.9*  HCT 30.3*  PLT 514*   BMET Recent Labs    09/19/19 0445  NA 142  K 4.1  CL 108  CO2 25  GLUCOSE 96  BUN 10  CREATININE 0.51*  CALCIUM 8.5*   LFT No results for input(s): PROT, ALBUMIN, AST, ALT, ALKPHOS, BILITOT, BILIDIR, IBILI in the last 72 hours. PT/INR No results for input(s): LABPROT, INR in the last 72 hours.   Studies/Results: DG Chest Port 1 View  Result Date: 09/19/2019 CLINICAL DATA:  Chest tube, pneumothorax. EXAM: PORTABLE CHEST 1 VIEW COMPARISON:   09/16/2019 and CT chest 09/03/2019. FINDINGS: Trachea is midline. Heart size stable. Right PICC terminates in the low SVC. Lucency is again seen along the right mediastinum and heart border. There are 3 chest tubes in the right hemithorax, 2 at the apex and 1 at the base. There may be trace amount of air in the inferolateral right hemithorax. Lungs are clear. No left pleural fluid.  Extensive subcutaneous emphysema in the right neck and right chest. There is an acute appearing lower left anterolateral rib fracture. Old left rib fractures. Old distal left clavicle fracture. IMPRESSION: 1. Probable trace right hydropneumothorax, with 3 right chest tubes in place. 2. Residual pneumomediastinum and likely pneumopericardium. 3. Extensive right-sided subcutaneous emphysema. Electronically Signed   By: Lorin Picket M.D.   On: 09/19/2019 08:06   DG ESOPHAGUS W SINGLE CM (SOL OR THIN BA)  Result Date: 09/20/2019 CLINICAL DATA:  Esophageal perforation post repair EXAM: ESOPHOGRAM/BARIUM SWALLOW TECHNIQUE: Single contrast examination was performed using water-soluble contrast. FLUOROSCOPY TIME:  Fluoroscopy Time:  36 seconds Number of Acquired Spot Images: 0 COMPARISON:  September 14, 2019 and September 11, 2019 FINDINGS: Study was performed in a semi erect position. Patient swallowed contrast without difficulty. There is no extravasation to suggest leak. A severe stricture of the distal esophagus is again identified. There is improved but still minimal passage of contrast through the stricture into the stomach during the course of the study. IMPRESSION: No evidence of leak. Improved but still minimal passage of contrast through the severely narrowed distal esophagus. Electronically Signed   By: Macy Mis M.D.   On: 09/20/2019 08:38    Impression/Plan: -Esophageal rupture/Bo[place] status post primary repair of esophageal perforation with intercostal muscle flap on September 04, 2019.  -History of severe esophageal  stricture and severe esophagitis.  Dilation up to 8 mm balloon on August 02, 2019. -History of pulmonary embolism.  On full dose Lovenox  Recommendations ------------------------ -Barium swallow images reviewed personally.  Very tight stricture. -Difficult situation.  Fortunately patient has PEG tube for feeding and nutrition. -Case discussed with senior partner Dr. Watt Climes.  Would be somewhat early to do dilation at this time.  Recommend maximizing medical treatment with increasing Protonix to IV 40 mg twice a day which will help with severe esophagitis. -Consider repeating upper GI series early next week and/or  possible dilation next week. - D/W Dr. [name]  -Management options discussed with patient.  He verbalized understanding.    LOS: 17 days   Otis Brace  MD, FACP 09/20/2019, 1:16 PM  Contact #  [phone]

## 2019-09-20 NOTE — Plan of Care (Signed)
  Problem: Health Behavior/Discharge Planning: Goal: Ability to manage health-related needs will improve Outcome: Progressing   Problem: Clinical Measurements: Goal: Ability to maintain clinical measurements within normal limits will improve Outcome: Progressing Goal: Will remain free from infection Outcome: Progressing Goal: Diagnostic test results will improve Outcome: Progressing Goal: Respiratory complications will improve Outcome: Progressing Goal: Cardiovascular complication will be avoided Outcome: Progressing   Problem: Activity: Goal: Risk for activity intolerance will decrease Outcome: Progressing   Problem: Nutrition: Goal: Adequate nutrition will be maintained Outcome: Progressing   Problem: Coping: Goal: Level of anxiety will decrease Outcome: Progressing   Problem: Elimination: Goal: Will not experience complications related to bowel motility Outcome: Progressing Goal: Will not experience complications related to urinary retention Outcome: Progressing   Problem: Pain Managment: Goal: General experience of comfort will improve Outcome: Progressing   Problem: Safety: Goal: Ability to remain free from injury will improve Outcome: Progressing   Problem: Skin Integrity: Goal: Risk for impaired skin integrity will decrease Outcome: Progressing   Problem: Bowel/Gastric: Goal: Gastrointestinal status for postoperative course will improve Outcome: Progressing   Problem: Nutritional: Goal: Ability to achieve adequate nutritional intake will improve Outcome: Progressing   Problem: Clinical Measurements: Goal: Postoperative complications will be avoided or minimized Outcome: Progressing   Problem: Respiratory: Goal: Ability to maintain a clear airway will improve Outcome: Progressing   Problem: Skin Integrity: Goal: Demonstration of wound healing without infection will improve Outcome: Progressing

## 2019-09-20 NOTE — Progress Notes (Signed)
17 Days Post-Op Procedure(s) (LRB): THORACOTOMY MAJOR FOR ESOPHAGEAL PERFORATION WITH INTERCOSTAL MUSCLE FLAP (Right) Subjective: Returned from swallow study this AM. No new problems. Still having soreness at the CT insertion sites.   Objective: Vital signs in last 24 hours: Temp:  [98.1 F (36.7 C)-98.5 F (36.9 C)] 98.5 F (36.9 C) (12/10 0724) Pulse Rate:  [87-93] 93 (12/10 0724) Cardiac Rhythm: Normal sinus rhythm (12/10 0701) Resp:  [18-22] 22 (12/10 0724) BP: (112-130)/(83-85) 130/85 (12/10 0724) SpO2:  [90 %-99 %] 97 % (12/10 0724) Weight:  [69.9 kg] 69.9 kg (12/10 0329)  Physical Exam: General appearance:alert, cooperative and mild distress Neurologic:intact Heart:ST 100-110. Lungs:Breath sounds are clear.Minimal drainage from chest tubes. Fluid is clear, serous. Abdomen:Soft, non-tender. G-J tube secured, site dry. Wound:The thorocotomy incision is dry and healing well.     Intake/Output from previous day: 12/09 0701 - 12/10 0700 In: 3062.5 [I.V.:1174.8; NG/GT:1140; IV Piggyback:747.6] Out: 8850 [Urine:1450; Chest Tube:130] Intake/Output this shift: Total I/O In: 40 [I.V.:40] Out: -     Lab Results: Recent Labs    09/19/19 0445  WBC 5.8  HGB 8.9*  HCT 30.3*  PLT 514*   BMET:  Recent Labs    09/19/19 0445  NA 142  K 4.1  CL 108  CO2 25  GLUCOSE 96  BUN 10  CREATININE 0.51*  CALCIUM 8.5*    PT/INR: No results for input(s): LABPROT, INR in the last 72 hours. ABG    Component Value Date/Time   PHART 7.432 09/14/2019 1405   HCO3 24.1 09/14/2019 1405   TCO2 28 09/04/2019 1241   ACIDBASEDEF 6.0 (H) 09/04/2019 0522   O2SAT 95.4 09/14/2019 1405   CBG (last 3)  Recent Labs    09/20/19 0016 09/20/19 0324 09/20/19 0733  GLUCAP 88 96 117*    Assessment/Plan: S/P Procedure(s) (LRB): THORACOTOMY MAJOR FOR ESOPHAGEAL PERFORATION WITH INTERCOSTAL MUSCLE FLAP (Right)  -POD-17right thoracotomy, repair of esophogeal perforation with  intercostal muscle flap. Afebrile and WBC remainsnormal. Swallow study this am again shows no esophageal leak. The distal esophageal stricture was slightly less stenosed than the previous exam 1 week ago. . Will discontinue ABX and Diflucan today. Remove the middle chest tube.  May continueice chipswhile sitting up. PICC placed 12//9  -Nutrition-  TF is infusing at goal rateof 58ml hrwith no sign of obstruction.  Continue support.  -EtOH abuse-on detox protocol / Precedex early post-op.Now calm and cooperative.Continue Seroquel  -Mild anemia- Hct.Trending up, continue to monitor.   -Hypokalemia-Replacement given, K+ 4.112/9.Monitor  -H/O PE prior to surgery. On full dose Lovenox 65mg  [place] q12H.   LOS: 17 days    [name], [place] [phone] 09/20/2019

## 2019-09-21 ENCOUNTER — Inpatient Hospital Stay (HOSPITAL_COMMUNITY): Payer: Self-pay

## 2019-09-21 LAB — GLUCOSE, CAPILLARY
Glucose-Capillary: 89 mg/dL (ref 70–99)
Glucose-Capillary: 108 mg/dL — ABNORMAL HIGH (ref 70–99)
Glucose-Capillary: 100 mg/dL — ABNORMAL HIGH (ref 70–99)
Glucose-Capillary: 114 mg/dL — ABNORMAL HIGH (ref 70–99)
Glucose-Capillary: 141 mg/dL — ABNORMAL HIGH (ref 70–99)
Glucose-Capillary: 120 mg/dL — ABNORMAL HIGH (ref 70–99)
Glucose-Capillary: 145 mg/dL — ABNORMAL HIGH (ref 70–99)

## 2019-09-21 LAB — BASIC METABOLIC PANEL
Anion gap: 7 (ref 5–15)
BUN: 10 mg/dL (ref 8–23)
CO2: 27 mmol/L (ref 22–32)
Calcium: 8.6 mg/dL — ABNORMAL LOW (ref 8.9–10.3)
Chloride: 106 mmol/L (ref 98–111)
Creatinine, Ser: 0.49 mg/dL — ABNORMAL LOW (ref 0.61–1.24)
GFR calc Af Amer: >60 mL/min (ref >60)
GFR calc non Af Amer: >60 mL/min (ref >60)
Glucose, Bld: 105 mg/dL — ABNORMAL HIGH (ref 70–99)
Potassium: 3.8 mmol/L (ref 3.5–5.1)
Sodium: 140 mmol/L (ref 135–145)

## 2019-09-21 LAB — CBC
HCT: 27.6 % — ABNORMAL LOW (ref 39.0–52.0)
Hemoglobin: 8.1 g/dL — ABNORMAL LOW (ref 13.0–17.0)
MCH: 26 pg (ref 26.0–34.0)
MCHC: 29.3 g/dL — ABNORMAL LOW (ref 30.0–36.0)
MCV: 88.7 fL (ref 80.0–100.0)
Platelets: 460 10*3/uL — ABNORMAL HIGH (ref 150–400)
RBC: 3.11 MIL/uL — ABNORMAL LOW (ref 4.22–5.81)
RDW: 19 % — ABNORMAL HIGH (ref 11.5–15.5)
WBC: 5.4 10*3/uL (ref 4.0–10.5)
nRBC: 0 % (ref 0.0–0.2)

## 2019-09-21 MED ORDER — MORPHINE SULFATE (PF) 2 MG/ML IV SOLN
2.0000 mg | INTRAVENOUS | Status: DC | PRN
  Administered 2019-09-21 – 2019-09-22 (×2): 2 mg via INTRAVENOUS
  Filled 2019-09-21 (×2): qty 1

## 2019-09-21 MED ORDER — OXYCODONE HCL 5 MG/5ML PO SOLN
5.0000 mg | ORAL | Status: DC | PRN
  Administered 2019-09-21 – 2019-09-24 (×13): 10 mg
  Administered 2019-09-24: 5 mg
  Administered 2019-09-24 (×3): 10 mg
  Administered 2019-09-24: 5 mg
  Administered 2019-09-25 – 2019-10-04 (×37): 10 mg
  Administered 2019-10-05: 08:00:00 5 mg
  Filled 2019-09-21: qty 5
  Filled 2019-09-21 (×29): qty 10
  Filled 2019-09-21: qty 5
  Filled 2019-09-21 (×12): qty 10
  Filled 2019-09-21: qty 5
  Filled 2019-09-21 (×13): qty 10

## 2019-09-21 NOTE — Progress Notes (Addendum)
      [address]       [place], [address]             [phone]      18 Days Post-Op Procedure(s) (LRB): THORACOTOMY MAJOR FOR ESOPHAGEAL PERFORATION WITH INTERCOSTAL MUSCLE FLAP (Right)   Subjective:  Patient is trying to nap.  Having pain.  Objective: Vital signs in last 24 hours: Temp:  [97.6 F (36.4 C)-98.3 F (36.8 C)] 97.6 F (36.4 C) (12/11 0758) Pulse Rate:  [91-104] 94 (12/11 0758) Cardiac Rhythm: Sinus tachycardia (12/10 1900) Resp:  [11-18] 16 (12/11 0758) BP: (107-116)/(78-91) 116/87 (12/11 0758) SpO2:  [96 %-99 %] 97 % (12/11 0758) Weight:  [69.5 kg] 69.5 kg (12/11 0659)  Intake/Output from previous day: 12/10 0701 - 12/11 0700 In: 2831.2 [I.V.:270; NG/GT:1920; IV Piggyback:641.2] Out: 1980 [Urine:1850; Chest Tube:130]  General appearance: alert, cooperative and no distress Heart: regular rate and rhythm Lungs: clear to auscultation bilaterally Abdomen: soft, non-tender; bowel sounds normal; no masses,  no organomegaly Extremities: extremities normal, atraumatic, no cyanosis or edema Wound: clean and dry  Lab Results: Recent Labs    09/19/19 0445 09/21/19 0500  WBC 5.8 5.4  HGB 8.9* 8.1*  HCT 30.3* 27.6*  PLT 514* 460*   BMET:  Recent Labs    09/19/19 0445 09/21/19 0500  NA 142 140  K 4.1 3.8  CL 108 106  CO2 25 27  GLUCOSE 96 105*  BUN 10 10  CREATININE 0.51* 0.49*  CALCIUM 8.5* 8.6*    PT/INR: No results for input(s): LABPROT, INR in the last 72 hours. ABG    Component Value Date/Time   PHART 7.432 09/14/2019 1405   HCO3 24.1 09/14/2019 1405   TCO2 28 09/04/2019 1241   ACIDBASEDEF 6.0 (H) 09/04/2019 0522   O2SAT 95.4 09/14/2019 1405   CBG (last 3)  Recent Labs    09/20/19 1945 09/21/19 0437 09/21/19 0814  GLUCAP 123* 89 108*    Assessment/Plan: S/P Procedure(s) (LRB): THORACOTOMY MAJOR FOR ESOPHAGEAL PERFORATION WITH INTERCOSTAL MUSCLE FLAP (Right)  1. CV- hemodynamically stable 2. Pulm- CT  remains in place, no significant air leak, 130 cc output, right lateral pneumothorax which is small 3. ID- afebrile, leukocytosis improved, has completed 7 day course will discontinue 4. GI- no oral intake due to esophageal stricture, GI is involved and will possibly try to dilate stricture, continue tube feedings 5. Pain control- using Oxy, will add Tramadol 6. Hypokalemia stable 7. H/O PE, on Lovenox 8. Dispo- patient stable, stop ABX, small pneumothorax on CXR, will repeat in AM, GI following, for possible dilation in the near future, continue current care   LOS: 18 days    Ellwood Handler, PA-C  09/21/2019 Patient seen and examined, agree above Will plan repeat swallow early next week and possible endoscopy later  [name], MD [facility] [phone]

## 2019-09-21 NOTE — TOC Progression Note (Signed)
Transition of Care (TOC) - Progression Note    Patient Details  Name: George Wade. MRN: [id number] Date of Birth: 1956-02-05  Transition of Care [facility]) CM/SW [place], [place] Phone Number: 09/21/2019, 2:07 PM  Clinical Narrative:     CSW called and spoke with Ebony Hail about verifying the patient's medicare. She stated that they were not able to verify that he doesn't currently have medicare from what they were able to find. She stated she was only able to look in [facility].   CSW asked if they would be able to offer the patient an LOG bed. She stated she would have to get permission from her uppers. She stated she would follow up with the CSW as soon as she had verification.   CSW will continue to follow and assist with discharge planning.   Expected Discharge Plan: [place] Barriers to Discharge: Inadequate or no insurance, Continued Medical Work up  Expected Discharge Plan and Services Expected Discharge Plan: [place] arrangements for the past 2 months: Hotel/Motel                                       Social Determinants of Health (SDOH) Interventions    Readmission Risk Interventions No flowsheet data found.

## 2019-09-21 NOTE — Progress Notes (Signed)
[facility] Gastroenterology Progress Note  George Wade. 63 y.o. 04/01/1956  CC: Esophageal perforation, s/p repair, esophageal stricture   Subjective: Seen and examined at bedside.  Feeling sleepy.  No acute issues since yesterday  ROS : Complaining of chest soreness at chest tube site.  Negative for acute shortness of breath.  Afebrile   Objective: Vital signs in last 24 hours: Vitals:   09/21/19 0437 09/21/19 0758  BP: 110/78 116/87  Pulse: 98 94  Resp: 18 16  Temp: 98.2 F (36.8 C) 97.6 F (36.4 C)  SpO2: 97% 97%    Physical Exam:  General.  Somewhat sleepy.  Not in acute distress Abdomen.  Soft, nontender, nondistended, bowel sounds present Psych.  Mood and affect normal Neuro.  Alert/oriented x3  Lab Results: Recent Labs    09/19/19 0445 09/21/19 0500  NA 142 140  K 4.1 3.8  CL 108 106  CO2 25 27  GLUCOSE 96 105*  BUN 10 10  CREATININE 0.51* 0.49*  CALCIUM 8.5* 8.6*   No results for input(s): AST, ALT, ALKPHOS, BILITOT, PROT, ALBUMIN in the last 72 hours. Recent Labs    09/19/19 0445 09/21/19 0500  WBC 5.8 5.4  HGB 8.9* 8.1*  HCT 30.3* 27.6*  MCV 89.4 88.7  PLT 514* 460*   No results for input(s): LABPROT, INR in the last 72 hours.    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  -History of severe esophageal stricture and severe esophagitis.  Dilation up to 8 mm balloon on August 02, 2019. -History of pulmonary embolism.  On full dose Lovenox  Recommendations ------------------------ -I think it is okay to try few sips of clear liquid diet ( if ok with Dr. [name]).  Patient was advised to stop oral intake if he develops any nausea or vomiting.  Discussed with nursing staff.  -  Recommend maximizing medical treatment with increasing Protonix to IV 40 mg twice a day which will help with severe esophagitis.  -Consider repeating  upper GI series early next week and/or  possible dilation next week.  [facility] will follow up on Monday.    Otis Brace MD, [place] 09/21/2019, 11:35 AM  Contact #  [phone]

## 2019-09-21 NOTE — Progress Notes (Signed)
PT Cancellation Note  Patient Details Name: George Wade. MRN: [id number] DOB: 1955-11-24   Cancelled Treatment:    Reason Eval/Treat Not Completed: Patient's level of consciousness Pt very sleepy, not able to stay awake and falls asleep mid sentence. Will follow.   Marguarite Arbour A Daichi Moris 09/21/2019, 1:08 PM Marisa Severin, PT, DPT Acute Rehabilitation Services Pager [phone] Office [phone]

## 2019-09-22 LAB — GLUCOSE, CAPILLARY
Glucose-Capillary: 109 mg/dL — ABNORMAL HIGH (ref 70–99)
Glucose-Capillary: 114 mg/dL — ABNORMAL HIGH (ref 70–99)
Glucose-Capillary: 123 mg/dL — ABNORMAL HIGH (ref 70–99)
Glucose-Capillary: 124 mg/dL — ABNORMAL HIGH (ref 70–99)
Glucose-Capillary: 125 mg/dL — ABNORMAL HIGH (ref 70–99)
Glucose-Capillary: 131 mg/dL — ABNORMAL HIGH (ref 70–99)

## 2019-09-22 MED ORDER — TRAMADOL HCL 50 MG PO TABS
100.0000 mg | ORAL_TABLET | Freq: Four times a day (QID) | ORAL | Status: DC | PRN
  Administered 2019-09-22 – 2019-11-03 (×117): 100 mg via JEJUNOSTOMY
  Filled 2019-09-22 (×123): qty 2

## 2019-09-22 NOTE — Progress Notes (Addendum)
19 Days Post-Op Procedure(s) (LRB): THORACOTOMY MAJOR FOR ESOPHAGEAL PERFORATION WITH INTERCOSTAL MUSCLE FLAP (Right) Subjective: Sleeping but awakens easily. Said he was up to the chair earlier this AM. Still having some pain at the CT insertion sites.   Objective: Vital signs in last 24 hours: Temp:  [97.9 F (36.6 C)-98.6 F (37 C)] 97.9 F (36.6 C) (12/12 0741) Pulse Rate:  [89-113] 106 (12/12 0845) Cardiac Rhythm: Sinus tachycardia (12/12 0700) Resp:  [18-23] 18 (12/12 0845) BP: (104-133)/(72-97) 113/90 (12/12 0741) SpO2:  [92 %-97 %] 92 % (12/12 0845) Weight:  [66.3 kg] 66.3 kg (12/12 0328)     Intake/Output from previous day: 12/11 0701 - 12/12 0700 In: 1215 [P.O.:480; NG/GT:675] Out: 980 [Urine:900; Chest Tube:80] Intake/Output this shift: Total I/O In: 20 [Other:20] Out: 80 [Chest Tube:80]  Physical Exam: General appearance: alert, cooperative and no distress Heart: regular rhythm, mild S. Tach. Lungs: clear to auscultation bilaterally Abdomen: soft, non-tender; bowel sounds normal. G-J tube secure and TF infusing at 37ml/hr.  Extremities: extremities normal, atraumatic, no cyanosis or edema Wound: clean and dry  Lab Results: Recent Labs    09/21/19 0500  WBC 5.4  HGB 8.1*  HCT 27.6*  PLT 460*   BMET:  Recent Labs    09/21/19 0500  NA 140  K 3.8  CL 106  CO2 27  GLUCOSE 105*  BUN 10  CREATININE 0.49*  CALCIUM 8.6*    PT/INR: No results for input(s): LABPROT, INR in the last 72 hours. ABG    Component Value Date/Time   PHART 7.432 09/14/2019 1405   HCO3 24.1 09/14/2019 1405   TCO2 28 09/04/2019 1241   ACIDBASEDEF 6.0 (H) 09/04/2019 0522   O2SAT 95.4 09/14/2019 1405   CBG (last 3)  Recent Labs    09/21/19 2325 09/22/19 0337 09/22/19 0747  GLUCAP 145* 109* 131*    Assessment/Plan: S/P Procedure(s) (LRB): THORACOTOMY MAJOR FOR ESOPHAGEAL PERFORATION WITH INTERCOSTAL MUSCLE FLAP (Right)  -POD-19right thoracotomy, repair of  esophogeal perforation with intercostal muscle flap. Afebrile and WBC remainsnormal. Swallow study on 12/10 again showed no esophageal leak. The distal esophageal stricture was slightly less stenosed than the previous exam 1 week previous.  Appreciate evaluation by the GI service. Plan repeat swallow eval early next week and possible esophageal dilation by GI team if appropriate. Leave CT in place until after dilation.  May continueclear liquids PICC placed 12//9  -Nutrition- TF is infusing at goal rateof 40ml hrwith no sign of obstruction.  Continue support.  -EtOH abuse-on detox protocol / Precedex early post-op.Now calm and cooperative.Continue Seroquel  -Mild anemia- Hct.trending down but no indication for transfusion. Monitor.   -Hypokalemia-Replacement given, K+3.8 yesterday.Monitor  -H/O PE prior to surgery. On full dose Lovenox 65mg  [place] q12H.   LOS: 19 days    [name], [place] [phone] 09/22/2019  Stable now , no fever I have seen and examined [name]. and agree with the above assessment  and plan.  Grace Isaac MD Beeper [phone] Office [phone] 09/22/2019 11:19 AM

## 2019-09-22 NOTE — Progress Notes (Signed)
Pt has a complain of constant surgical pain covered by pain medicines, pt refused to get up and walk and said he is not comfortable, positioning changed frequently, besides Feeding continue, vitals stable, having productive cough and pt is using suctioning himself frequently, will continue to monitor the patient.  Will continue to monitor the patient  Palma Holter, RN

## 2019-09-23 ENCOUNTER — Inpatient Hospital Stay (HOSPITAL_COMMUNITY): Payer: Self-pay

## 2019-09-23 LAB — CBC
HCT: 26.7 % — ABNORMAL LOW (ref 39.0–52.0)
Hemoglobin: 8.1 g/dL — ABNORMAL LOW (ref 13.0–17.0)
MCH: 26.6 pg (ref 26.0–34.0)
MCHC: 30.3 g/dL (ref 30.0–36.0)
MCV: 87.8 fL (ref 80.0–100.0)
Platelets: 437 10*3/uL — ABNORMAL HIGH (ref 150–400)
RBC: 3.04 MIL/uL — ABNORMAL LOW (ref 4.22–5.81)
RDW: 18.6 % — ABNORMAL HIGH (ref 11.5–15.5)
WBC: 5.8 10*3/uL (ref 4.0–10.5)
nRBC: 0 % (ref 0.0–0.2)

## 2019-09-23 LAB — BASIC METABOLIC PANEL
Anion gap: 8 (ref 5–15)
BUN: 15 mg/dL (ref 8–23)
CO2: 27 mmol/L (ref 22–32)
Calcium: 8.6 mg/dL — ABNORMAL LOW (ref 8.9–10.3)
Chloride: 106 mmol/L (ref 98–111)
Creatinine, Ser: 0.44 mg/dL — ABNORMAL LOW (ref 0.61–1.24)
GFR calc Af Amer: >60 mL/min (ref >60)
GFR calc non Af Amer: >60 mL/min (ref >60)
Glucose, Bld: 111 mg/dL — ABNORMAL HIGH (ref 70–99)
Potassium: 4 mmol/L (ref 3.5–5.1)
Sodium: 141 mmol/L (ref 135–145)

## 2019-09-23 LAB — GLUCOSE, CAPILLARY
Glucose-Capillary: 109 mg/dL — ABNORMAL HIGH (ref 70–99)
Glucose-Capillary: 151 mg/dL — ABNORMAL HIGH (ref 70–99)
Glucose-Capillary: 124 mg/dL — ABNORMAL HIGH (ref 70–99)
Glucose-Capillary: 127 mg/dL — ABNORMAL HIGH (ref 70–99)

## 2019-09-23 MED ORDER — KETOROLAC TROMETHAMINE 15 MG/ML IJ SOLN
15.0000 mg | Freq: Four times a day (QID) | INTRAMUSCULAR | Status: AC | PRN
  Administered 2019-09-23 – 2019-09-26 (×8): 15 mg via INTRAVENOUS
  Filled 2019-09-23 (×8): qty 1

## 2019-09-23 NOTE — Plan of Care (Signed)

## 2019-09-23 NOTE — Progress Notes (Addendum)
20 Days Post-Op Procedure(s) (LRB): THORACOTOMY MAJOR FOR ESOPHAGEAL PERFORATION WITH INTERCOSTAL MUSCLE FLAP (Right) Subjective: Still having trouble with pain at the CT insertion sites. Tramadol is helping but does not last very long. He wants to avoid taking any more morphine.  Taking small amounts of clear liquids.   Objective: Vital signs in last 24 hours: Temp:  [97.6 F (36.4 C)-98.4 F (36.9 C)] 97.9 F (36.6 C) (12/13 0731) Pulse Rate:  [88-106] 94 (12/13 0731) Cardiac Rhythm: Sinus tachycardia (12/13 0800) Resp:  [15-26] 21 (12/13 0731) BP: (96-135)/(70-95) 98/75 (12/13 0731) SpO2:  [91 %-98 %] 97 % (12/13 0731) Weight:  [67.8 kg] 67.8 kg (12/13 0334)     Intake/Output from previous day: 12/12 0701 - 12/13 0700 In: 2560 [P.O.:1200; NG/GT:1320] Out: 1350 [Urine:1100; Chest Tube:250] Intake/Output this shift: Total I/O In: 120 [P.O.:120] Out: 0   Physical Exam: General appearance:alert, cooperative and no distress Heart:regular rhythm, mild S. Tach. Lungs:clear to auscultation bilaterally Abdomen:soft, non-tender; bowel sounds normal. G-J tube secure and TF infusing at 4ml/hr.  Extremities:extremities normal, atraumatic, no cyanosis or edema Wound:the CT insertion sites have expected local iritation. Skin around the tubes appears healthy otherwise.   Lab Results: Recent Labs    09/21/19 0500 09/23/19 0404  WBC 5.4 5.8  HGB 8.1* 8.1*  HCT 27.6* 26.7*  PLT 460* 437*   BMET:  Recent Labs    09/21/19 0500 09/23/19 0404  NA 140 141  K 3.8 4.0  CL 106 106  CO2 27 27  GLUCOSE 105* 111*  BUN 10 15  CREATININE 0.49* 0.44*  CALCIUM 8.6* 8.6*    PT/INR: No results for input(s): LABPROT, INR in the last 72 hours. ABG    Component Value Date/Time   PHART 7.432 09/14/2019 1405   HCO3 24.1 09/14/2019 1405   TCO2 28 09/04/2019 1241   ACIDBASEDEF 6.0 (H) 09/04/2019 0522   O2SAT 95.4 09/14/2019 1405   CBG (last 3)  Recent Labs    09/22/19 2342  09/23/19 0417 09/23/19 0740  GLUCAP 114* 109* 151*    Assessment/Plan: S/P Procedure(s) (LRB): THORACOTOMY MAJOR FOR ESOPHAGEAL PERFORATION WITH INTERCOSTAL MUSCLE FLAP (Right)  -POD-19right thoracotomy, repair of esophogeal perforation with intercostal muscle flap. Afebrile and WBC remainsnormal. Will give Toradol for 48 hours for pain at CT insertion sites. Hopefully tubes can be safely removes soon.   Appreciate evaluation by the GI service. Plan repeat swallow eval early next week and possible esophageal dilation by GI team if appropriate. Leave CT's in place until after dilation.  May continueclear liquids PICC placed 12//9, site looks good.   -Nutrition- TF is infusing at goal rateof 19ml hrwith no sign of obstruction.Continue support.  -EtOH abuse-on detox protocol / Precedex early post-op.Now calm and cooperative.Continue Seroquel  -Mild anemia- Hct.trending down slightly but no indication for transfusion. Monitor.   -Hypokalemia-Replacement given, K+4.0 today. Monitor  -H/O PE prior to surgery. On full dose Lovenox 65mg  [place] q12H.   LOS: 20 days    George Wade, George Wade [phone] 09/23/2019 I have seen and examined George Wade. and agree with the above assessment  and plan.  George Isaac MD Beeper [phone] Office [phone] 09/23/2019 10:02 AM

## 2019-09-24 LAB — GLUCOSE, CAPILLARY
Glucose-Capillary: 101 mg/dL — ABNORMAL HIGH (ref 70–99)
Glucose-Capillary: 103 mg/dL — ABNORMAL HIGH (ref 70–99)
Glucose-Capillary: 104 mg/dL — ABNORMAL HIGH (ref 70–99)
Glucose-Capillary: 111 mg/dL — ABNORMAL HIGH (ref 70–99)
Glucose-Capillary: 90 mg/dL (ref 70–99)
Glucose-Capillary: 99 mg/dL (ref 70–99)

## 2019-09-24 LAB — BASIC METABOLIC PANEL
Anion gap: 7 (ref 5–15)
BUN: 19 mg/dL (ref 8–23)
CO2: 25 mmol/L (ref 22–32)
Calcium: 8.2 mg/dL — ABNORMAL LOW (ref 8.9–10.3)
Chloride: 107 mmol/L (ref 98–111)
Creatinine, Ser: 0.51 mg/dL — ABNORMAL LOW (ref 0.61–1.24)
GFR calc Af Amer: >60 mL/min (ref >60)
GFR calc non Af Amer: >60 mL/min (ref >60)
Glucose, Bld: 106 mg/dL — ABNORMAL HIGH (ref 70–99)
Potassium: 4 mmol/L (ref 3.5–5.1)
Sodium: 139 mmol/L (ref 135–145)

## 2019-09-24 NOTE — Progress Notes (Signed)
Plans for repeat esophagram tomorrow noted.  We will see if the patient's high-dose PPI therapy has led to perhaps progressive improvement in the patient's distal esophageal stricture.  The patient does not note any obvious improvement in his swallowing over the past week, to correlate with the apparent mild radiographic improvement seen on his most recent esophagram.  I explained to the patient that there are several reasons why he would be at high risk for esophageal perforation if an endoscopic dilatation were attempted: The tightness of the stricture, his history of esophagitis (although not mentioned on his more recent endoscopy done in [place] on October 22), and his recent esophageal perforation.  Therefore, depending on the results of tomorrow's study, we would need to discuss the risks and benefits of doing repeat dilatation at this time with his attending thoracic surgeon.  Cleotis Nipper, M.D. Pager [phone] If no answer or after 5 PM call [phone]

## 2019-09-24 NOTE — Plan of Care (Signed)
  Problem: Education: Goal: Knowledge of General Education information will improve Description: Including pain rating scale, medication(s)/side effects and non-pharmacologic comfort measures Outcome: Progressing   Problem: Health Behavior/Discharge Planning: Goal: Ability to manage health-related needs will improve Outcome: Progressing   Problem: Clinical Measurements: Goal: Ability to maintain clinical measurements within normal limits will improve Outcome: Progressing Goal: Will remain free from infection Outcome: Progressing Goal: Diagnostic test results will improve Outcome: Progressing Goal: Respiratory complications will improve Outcome: Progressing Goal: Cardiovascular complication will be avoided Outcome: Progressing   Problem: Activity: Goal: Risk for activity intolerance will decrease Outcome: Progressing   Problem: Nutrition: Goal: Adequate nutrition will be maintained Outcome: Progressing   Problem: Coping: Goal: Level of anxiety will decrease Outcome: Progressing   Problem: Elimination: Goal: Will not experience complications related to bowel motility Outcome: Progressing Goal: Will not experience complications related to urinary retention Outcome: Progressing   Problem: Pain Managment: Goal: General experience of comfort will improve Outcome: Progressing   Problem: Safety: Goal: Ability to remain free from injury will improve Outcome: Progressing   Problem: Skin Integrity: Goal: Risk for impaired skin integrity will decrease Outcome: Progressing   Problem: Bowel/Gastric: Goal: Gastrointestinal status for postoperative course will improve Outcome: Progressing   Problem: Nutritional: Goal: Ability to achieve adequate nutritional intake will improve Outcome: Progressing   Problem: Clinical Measurements: Goal: Postoperative complications will be avoided or minimized Outcome: Progressing   Problem: Respiratory: Goal: Ability to maintain a  clear airway will improve Outcome: Progressing   Problem: Skin Integrity: Goal: Demonstration of wound healing without infection will improve Outcome: Progressing

## 2019-09-24 NOTE — Progress Notes (Signed)
      [address]       [place] [address]             [phone]      21 Days Post-Op Procedure(s) (LRB): THORACOTOMY MAJOR FOR ESOPHAGEAL PERFORATION WITH INTERCOSTAL MUSCLE FLAP (Right)   Subjective:  Patient getting ready to work with therapy.  He has some improvement of pain with use of Toradol.  Remains on clear liquid diet with small sips  Objective: Vital signs in last 24 hours: Temp:  [97.5 F (36.4 C)-98.7 F (37.1 C)] 98.3 F (36.8 C) (12/14 0317) Pulse Rate:  [86-97] 97 (12/14 0317) Cardiac Rhythm: Normal sinus rhythm (12/13 2118) Resp:  [14-21] 19 (12/14 0317) BP: (94-123)/(73-96) 117/81 (12/14 0317) SpO2:  [96 %-100 %] 100 % (12/14 0317) Weight:  [66.8 kg] 66.8 kg (12/14 0317)  Intake/Output from previous day: 12/13 0701 - 12/14 0700 In: 1560 [P.O.:600; NG/GT:960] Out: 1050 [Urine:1050]  General appearance: alert, cooperative and no distress Heart: regular rate and rhythm Lungs: clear to auscultation bilaterally Abdomen: soft, non-tender; bowel sounds normal; no masses,  no organomegaly Wound: clean and dry, some irritation at chest tube sites from suture  Lab Results: Recent Labs    09/23/19 0404  WBC 5.8  HGB 8.1*  HCT 26.7*  PLT 437*   BMET:  Recent Labs    09/23/19 0404 09/24/19 0500  NA 141 139  K 4.0 4.0  CL 106 107  CO2 27 25  GLUCOSE 111* 106*  BUN 15 19  CREATININE 0.44* 0.51*  CALCIUM 8.6* 8.2*    PT/INR: No results for input(s): LABPROT, INR in the last 72 hours. ABG    Component Value Date/Time   PHART 7.432 09/14/2019 1405   HCO3 24.1 09/14/2019 1405   TCO2 28 09/04/2019 1241   ACIDBASEDEF 6.0 (H) 09/04/2019 0522   O2SAT 95.4 09/14/2019 1405   CBG (last 3)  Recent Labs    09/23/19 1554 09/24/19 0026 09/24/19 0407  GLUCAP 127* 90 101*    Assessment/Plan: S/P Procedure(s) (LRB): THORACOTOMY MAJOR FOR ESOPHAGEAL PERFORATION WITH INTERCOSTAL MUSCLE FLAP (Right)  1. CV- hemodynamically  stable 2. Pulm- CTs remain in place, no output, however needs to remain in place until repeat esophogram can be completed 3. GI- esophageal stricture, GI is following, planning for repeat esophagram with possible dilatation, continue clear liquids, continue tube feeds 4. H/O EtOH abuse- on seroquel 5. H/O PE prior to surgery, continue Lovenox 6. Dispo- patient stable, continue current care, for repeat swallow soon GI following for possible dilatation   LOS: 21 days    Ellwood Handler, PA-C  09/24/2019

## 2019-09-24 NOTE — Progress Notes (Signed)
Patient has suction hook up, 373mL of liquid/mucous in canister. Patient states he "feels like liquid is stuck between sternum and [place] and he feels the need to suction". Patient feels like all of his oral intake, water, ice and oral liquid medication "has been coming back up and not making it to his stomach".

## 2019-09-24 NOTE — Progress Notes (Signed)
Physical Therapy Treatment Patient Details Name: George Wade. MRN: [id number] DOB: 02-27-1956 Today's Date: 09/24/2019    History of Present Illness Pt is a 63 y.o. male admitted 09/03/19 with c/o R-side chest pain. CT showed R hydropneumothorax and extensive distal pneumomediastinum. Pt s/p R major thoracotomy for esophageal performation with intercostal muscle flap on 11/24. PMH includes alcohol abuse, non-Hodgkin's lymphoma, tonsillar cancer, HTN, PE, esophageal stricture. Of note, recent admission 07/2019-08/2019 for chest pain and alcohol-induced mood disorder.    PT Comments    Patient progressing well towards PT goals. Reports soreness throughout back and right side (chest tube insertion) and upset about not having full amount of pain medication. Tolerated gait training with supervision for safety today and use of RW. Noted to have 2/4 DOE but VSS throughout. Encouraged walking more frequently with nursing. Eager to get chest tubes removed. Will follow.    Follow Up Recommendations  SNF;Supervision for mobility/OOB     Equipment Recommendations  Rolling walker with 5" wheels    Recommendations for Other Services       Precautions / Restrictions Precautions Precautions: Fall Precaution Comments: Multiple lines, chest tubes; PEG Restrictions Weight Bearing Restrictions: No    Mobility  Bed Mobility Overal bed mobility: Modified Independent Bed Mobility: Supine to Sit;Sit to Supine     Supine to sit: Modified independent (Device/Increase time);HOB elevated Sit to supine: Modified independent (Device/Increase time);HOB elevated   General bed mobility comments: assist with lines but no physical assist needed   Transfers Overall transfer level: Needs assistance Equipment used: Rolling walker (2 wheeled) Transfers: Sit to/from Stand Sit to Stand: Supervision         General transfer comment: Supervision for safety. Stood fromEOB  x1.  Ambulation/Gait Ambulation/Gait assistance: Supervision Gait Distance (Feet): 350 Feet Assistive device: Rolling walker (2 wheeled) Gait Pattern/deviations: Step-through pattern;Decreased stride length Gait velocity: reduced   General Gait Details: slow, guarded movement but steady overall. VSS throughout, RR 38.   Stairs             Wheelchair Mobility    Modified Rankin (Stroke Patients Only)       Balance Overall balance assessment: Needs assistance Sitting-balance support: Feet supported;No upper extremity supported Sitting balance-Leahy Scale: Good     Standing balance support: During functional activity Standing balance-Leahy Scale: Fair Standing balance comment: preference to UE support in standing                            Cognition Arousal/Alertness: Awake/alert Behavior During Therapy: Anxious Overall Cognitive Status: Within Functional Limits for tasks assessed                                 General Comments: Initially upset about his pain medication, and not getting the amount he thinks he needs. " I am a mess, I cannot eeven tell you what day it is." Able to state correct date.      Exercises      General Comments General comments (skin integrity, edema, etc.): VSS on RA, except increased RR. BP preactivity 109/94, BP post activity 110/93.      Pertinent Vitals/Pain Pain Assessment: Faces Faces Pain Scale: Hurts whole lot Pain Location: back, chest tube site Pain Descriptors / Indicators: Aching;Sore;Discomfort Pain Intervention(s): RN gave pain meds during session;Repositioned;Monitored during session    Home Living  Prior Function            PT Goals (current goals can now be found in the care plan section) Progress towards PT goals: Progressing toward goals    Frequency    Min 3X/week      PT Plan Current plan remains appropriate    Co-evaluation               AM-PAC PT "6 Clicks" Mobility   Outcome Measure  Help needed turning from your back to your side while in a flat bed without using bedrails?: None Help needed moving from lying on your back to sitting on the side of a flat bed without using bedrails?: None Help needed moving to and from a bed to a chair (including a wheelchair)?: A Little Help needed standing up from a chair using your arms (e.g., wheelchair or bedside chair)?: A Little Help needed to walk in hospital room?: A Little Help needed climbing 3-5 steps with a railing? : A Little 6 Click Score: 20    End of Session   Activity Tolerance: Patient tolerated treatment well Patient left: in bed;with call bell/phone within reach Nurse Communication: Mobility status PT Visit Diagnosis: Other abnormalities of gait and mobility (R26.89);Muscle weakness (generalized) (M62.81);Pain Pain - Right/Left: Right Pain - part of body: (back)     Time: 4680-3212 PT Time Calculation (min) (ACUTE ONLY): 35 min  Charges:  $Gait Training: 8-22 mins $Therapeutic Activity: 8-22 mins                     Marisa Severin, PT, DPT Acute Rehabilitation Services Pager [phone] Office [place] 09/24/2019, 1:05 PM

## 2019-09-25 ENCOUNTER — Inpatient Hospital Stay (HOSPITAL_COMMUNITY): Payer: Self-pay

## 2019-09-25 LAB — GLUCOSE, CAPILLARY
Glucose-Capillary: 86 mg/dL (ref 70–99)
Glucose-Capillary: 112 mg/dL — ABNORMAL HIGH (ref 70–99)
Glucose-Capillary: 107 mg/dL — ABNORMAL HIGH (ref 70–99)
Glucose-Capillary: 116 mg/dL — ABNORMAL HIGH (ref 70–99)

## 2019-09-25 MED ORDER — OSMOLITE 1.5 CAL PO LIQD
1000.0000 mL | ORAL | Status: DC
  Administered 2019-09-25: 17:00:00 1000 mL
  Filled 2019-09-25 (×3): qty 1000

## 2019-09-25 MED ORDER — ENOXAPARIN SODIUM 80 MG/0.8ML ~~LOC~~ SOLN
1.0000 mg/kg | Freq: Two times a day (BID) | SUBCUTANEOUS | Status: DC
  Administered 2019-09-25 – 2019-10-04 (×15): 65 mg via SUBCUTANEOUS
  Filled 2019-09-25 (×16): qty 0.8

## 2019-09-25 MED ORDER — IOHEXOL 300 MG/ML  SOLN
150.0000 mL | Freq: Once | INTRAMUSCULAR | Status: AC | PRN
  Administered 2019-09-25: 12:00:00 20 mL via ORAL

## 2019-09-25 NOTE — Progress Notes (Signed)
Nutrition Follow-up  RD working remotely.  DOCUMENTATION CODES:   Non-severe (moderate) malnutrition in context of social or environmental circumstances  INTERVENTION:   Continue tube feeding: - Osmolite 1.5 @ 60 ml/hr (1458m/day) via J-tube - Pro-stat 30 ml BID  Tube feeding regimen provides2360kcal, 120grams of protein, and 10916mof H2O.  NUTRITION DIAGNOSIS:   Moderate Malnutrition related to social / environmental circumstances (homeless, ETOH abuse) as evidenced by moderate muscle depletion, moderate fat depletion, mild fat depletion.  Ongoing, being addressed via TF  GOAL:   Patient will meet greater than or equal to 90% of their needs  Met via TF  MONITOR:   Weight trends, Labs, I & O's, TF tolerance, Skin  REASON FOR ASSESSMENT:   Malnutrition Screening Tool    ASSESSMENT:   Patient with PMH significant for ETOH abuse, non-hodgkin's lymphoma, tonsillar cancer, esophogeal stricture s/p PEG, and HTN. Presents this admission with esophageal perforation.  11/24 -rightthoracotomy, repair of perforation, extubated 11/25 - G-tube converted to G-J tube 11/29 - pt removed NG tube, G-J tube clogged 12/01 - G-J tube unclogged, swallow study showing residual distal esophageal stricture and no leak of contrast 12/02 - diet advanced to clear liquids 12/04 - NPO, G-J tube fell out, transferred to ICU for sepsis 12/05 - transferred back to progressive care, G-J tube replaced 12/08 - PICC line 12/10 - swallow study showing no esophageal leak and severe distal esophageal stricture 12/11 - diet advanced to sips of clear liquids  Plans for repeat esophagram today noted. Possible esophogeal dilation by GI team if appropriate. Per notes, pt feels like all of his PO intake has not been making it to his stomach.  Weight has fluctuated between 145-155 lbs over the last 2 weeks. Will continue to monitor trends.  Meal Completion: 25-50% x last 8 meals (clear  liquids)  Current TF: Osmolite 1.5 @ 60 ml/hr, Pro-stat 30 ml BID  Medications reviewed and include: folic acid, protonix, thiamine  Labs reviewed: hemoglobin 8.1 CBG's: 99-111 x 24 hours  UOP: 850 ml x 24 hours CT output: 0 ml x 24 hours  Diet Order:   Diet Order            Diet clear liquid Room service appropriate? Yes; Fluid consistency: Thin  Diet effective now              EDUCATION NEEDS:   Not appropriate for education at this time  Skin:  Skin Assessment: Skin Integrity Issues: Skin Integrity Issues: Incisions: R chest  Last BM:  09/24/19 type 7  Height:   Ht Readings from Last 1 Encounters:  09/04/19 5' 8"  (1.727 m)    Weight:   Wt Readings from Last 1 Encounters:  09/25/19 67.5 kg    Ideal Body Weight:  70 kg  BMI:  Body mass index is 22.63 kg/m.  Estimated Nutritional Needs:   Kcal:  2200-2400 kcal  Protein:  110-125 grams  Fluid:  >/= 2.2 L/day    KaGaynell FaceMS, RD, LDN Inpatient Clinical Dietitian Pager: [phone]/After Hours: [phone]

## 2019-09-25 NOTE — Progress Notes (Signed)
Physical Therapy Treatment Patient Details Name: George Wade. MRN: [id number] DOB: 05-Feb-1956 Today's Date: 09/25/2019    History of Present Illness Pt is a 63 y.o. male admitted 09/03/19 with c/o R-side chest pain. CT showed R hydropneumothorax and extensive distal pneumomediastinum. Pt s/p R major thoracotomy for esophageal performation with intercostal muscle flap on 11/24. PMH includes alcohol abuse, non-Hodgkin's lymphoma, tonsillar cancer, HTN, PE, esophageal stricture. Of note, recent admission 07/2019-08/2019 for chest pain and alcohol-induced mood disorder.    PT Comments    Patient continues to make progress toward PT goals and is eager to participate in therapy.  Pt tolerated mobility well with VSS on RA. C/o pain at chest tube site and anxious about recent difficulty with swallowing. Continue to progress as tolerated.    Follow Up Recommendations  SNF;Supervision for mobility/OOB     Equipment Recommendations  Rolling walker with 5" wheels    Recommendations for Other Services       Precautions / Restrictions Precautions Precautions: Fall Precaution Comments: chest tubes; PEG Restrictions Weight Bearing Restrictions: No    Mobility  Bed Mobility Overal bed mobility: Modified Independent Bed Mobility: Supine to Sit;Sit to Supine              Transfers Overall transfer level: Modified independent Equipment used: Rolling walker (2 wheeled) Transfers: Sit to/from Stand           General transfer comment: use of RW upon standing  Ambulation/Gait Ambulation/Gait assistance: Supervision Gait Distance (Feet): 400 Feet Assistive device: Rolling walker (2 wheeled) Gait Pattern/deviations: Step-through pattern;Decreased stride length     General Gait Details: cues for upright posture at times; increased RR and mild SOB but denies need for rest break; HR and SpO2 WNL on RA    Stairs             Wheelchair Mobility    Modified Rankin  (Stroke Patients Only)       Balance Overall balance assessment: Needs assistance Sitting-balance support: Feet supported;No upper extremity supported Sitting balance-Leahy Scale: Good     Standing balance support: During functional activity Standing balance-Leahy Scale: Fair                              Cognition Arousal/Alertness: Awake/alert Behavior During Therapy: Anxious Overall Cognitive Status: Within Functional Limits for tasks assessed                                        Exercises      General Comments        Pertinent Vitals/Pain Pain Assessment: Faces Faces Pain Scale: Hurts little more Pain Location: chest tube site Pain Descriptors / Indicators: Sore;Discomfort Pain Intervention(s): Limited activity within patient's tolerance;Monitored during session;Repositioned;Patient requesting pain meds-RN notified    Home Living                      Prior Function            PT Goals (current goals can now be found in the care plan section) Progress towards PT goals: Progressing toward goals    Frequency    Min 3X/week      PT Plan Current plan remains appropriate    Co-evaluation              AM-PAC PT "6 Clicks" Mobility  Outcome Measure  Help needed turning from your back to your side while in a flat bed without using bedrails?: None Help needed moving from lying on your back to sitting on the side of a flat bed without using bedrails?: None Help needed moving to and from a bed to a chair (including a wheelchair)?: A Little Help needed standing up from a chair using your arms (e.g., wheelchair or bedside chair)?: A Little Help needed to walk in hospital room?: A Little Help needed climbing 3-5 steps with a railing? : A Little 6 Click Score: 20    End of Session   Activity Tolerance: Patient tolerated treatment well Patient left: in bed;with call bell/phone within reach(bed in chair  position) Nurse Communication: Mobility status PT Visit Diagnosis: Other abnormalities of gait and mobility (R26.89);Muscle weakness (generalized) (M62.81);Pain Pain - Right/Left: Right Pain - part of body: (back)     Time: 1543-1610 PT Time Calculation (min) (ACUTE ONLY): 27 min  Charges:  $Gait Training: 23-37 mins                     Earney Navy, PTA Acute Rehabilitation Services Pager: [phone] Office: [phone]     Darliss Cheney 09/25/2019, 4:58 PM

## 2019-09-25 NOTE — H&P (View-Only) (Signed)
Subjective: Complains of pain over the site of chest tube insertion.  Still unable to swallow liquids.  Receiving feeding via PEG tube.  Objective: Vital signs in last 24 hours: Temp:  [97.9 F (36.6 C)-98.4 F (36.9 C)] 98.3 F (36.8 C) (12/15 1231) Pulse Rate:  [88-95] 88 (12/15 0341) Resp:  [14-24] 15 (12/15 1231) BP: (106-130)/(83-93) 124/83 (12/15 1231) SpO2:  [96 %-99 %] 99 % (12/15 0341) Weight:  [67.5 kg] 67.5 kg (12/15 0341) Weight change: 0.7 kg Last BM Date: 09/23/19  PE: Sitting up on bed, not in distress GENERAL: Mild pallor ABDOMEN: 5 French gastrostomy tube in place, soft, nondistended abdomen EXTREMITIES: No deformity  Lab Results: Results for orders placed or performed during the hospital encounter of 09/03/19 (from the past 48 hour(s))  Glucose, capillary     Status: Abnormal   Collection Time: 09/23/19  3:54 PM  Result Value Ref Range   Glucose-Capillary 127 (H) 70 - 99 mg/dL   Comment 1 Notify RN    Comment 2 Document in Chart   Glucose, capillary     Status: None   Collection Time: 09/24/19 12:26 AM  Result Value Ref Range   Glucose-Capillary 90 70 - 99 mg/dL  Glucose, capillary     Status: Abnormal   Collection Time: 09/24/19  4:07 AM  Result Value Ref Range   Glucose-Capillary 101 (H) 70 - 99 mg/dL  Basic metabolic panel     Status: Abnormal   Collection Time: 09/24/19  5:00 AM  Result Value Ref Range   Sodium 139 135 - 145 mmol/L   Potassium 4.0 3.5 - 5.1 mmol/L   Chloride 107 98 - 111 mmol/L   CO2 25 22 - 32 mmol/L   Glucose, Bld 106 (H) 70 - 99 mg/dL   BUN 19 8 - 23 mg/dL   Creatinine, Ser 0.51 (L) 0.61 - 1.24 mg/dL   Calcium 8.2 (L) 8.9 - 10.3 mg/dL   GFR calc non Af Amer >60 >60 mL/min   GFR calc Af Amer >60 >60 mL/min   Anion gap 7 5 - 15    Comment: Performed at [facility], [place] [address], [place], [place] [address]  Glucose, capillary     Status: Abnormal   Collection Time: 09/24/19 11:32 AM  Result Value Ref Range    Glucose-Capillary 111 (H) 70 - 99 mg/dL   Comment 1 Notify RN    Comment 2 Document in Chart   Glucose, capillary     Status: Abnormal   Collection Time: 09/24/19  4:14 PM  Result Value Ref Range   Glucose-Capillary 104 (H) 70 - 99 mg/dL   Comment 1 Notify RN    Comment 2 Document in Chart   Glucose, capillary     Status: Abnormal   Collection Time: 09/24/19  7:57 PM  Result Value Ref Range   Glucose-Capillary 103 (H) 70 - 99 mg/dL  Glucose, capillary     Status: None   Collection Time: 09/24/19 11:32 PM  Result Value Ref Range   Glucose-Capillary 99 70 - 99 mg/dL  Glucose, capillary     Status: None   Collection Time: 09/25/19  3:43 AM  Result Value Ref Range   Glucose-Capillary 86 70 - 99 mg/dL  Glucose, capillary     Status: Abnormal   Collection Time: 09/25/19 12:32 PM  Result Value Ref Range   Glucose-Capillary 112 (H) 70 - 99 mg/dL   Comment 1 Notify RN    Comment 2 Document  in Chart     Studies/Results: DG Esophagus W/Water Sol CM  Result Date: 09/25/2019 CLINICAL DATA:  Esophageal perforation, status post surgical repair. EXAM: ESOPHOGRAM/BARIUM SWALLOW TECHNIQUE: Single contrast examination was performed using  water-soluble. FLUOROSCOPY TIME:  Fluoroscopy Time:  0 minutes and 48 seconds. Radiation Exposure Index (if provided by the fluoroscopic device): 75.3 mGy Number of Acquired Spot Images: COMPARISON:  09/20/2019. FINDINGS: Similar to prior exam, there is a tight stricture in the distal third of the esophagus through which only a trace amount of contrast passes after repeated swallows. Patient was given a total of approximately 20 cc water-soluble contrast material. Overall imaging appearance is very similar to the study from 09/20/2019. No contrast extravasation to suggest leak. IMPRESSION: Persistent tight distal esophageal stricture through which only a thin string of contrast passes with each swallow. Appearance is very similar to the prior study. No evidence for  contrast extravasation to suggest leak. Electronically Signed   By: Misty Stanley M.D.   On: 09/25/2019 12:09    Medications: I have reviewed the patient's current medications.  Assessment: Persistent tight distal esophageal stricture through which only a thin string of contrast passes with each swallow noted on water-soluble esophagogram today. No evidence for extravasation of contrast to suggest leak.  Last EGD was performed on 08/02/2019 and balloon dilatation was performed with 9 mm balloon, the scope could not be advanced beyond the strictured area.  Status post thoracotomy major for esophageal perforation with intercostal muscle flap performed on September 04, 2019.  Currently receiving nutrition via feeding tube  History of alcohol abuse on detox protocol  Normocytic anemia, thrombocytosis   Plan: Diagnostic EGD tomorrow, possible balloon dilatation, depending upon findings on endoscopy. Patient remains at high risk for further esophageal injury including perforation, history of recent esophageal perforation that required surgery. I have put in orders to hold morning dose of Lovenox and to hold feeding via PEG tube after midnight. The risks and the benefits of the procedure were discussed with the patient in details. He understands and verbalizes consent.  [name], MD 09/25/2019, 3:47 PM

## 2019-09-25 NOTE — Progress Notes (Signed)
Subjective: Complains of pain over the site of chest tube insertion.  Still unable to swallow liquids.  Receiving feeding via PEG tube.  Objective: Vital signs in last 24 hours: Temp:  [97.9 F (36.6 C)-98.4 F (36.9 C)] 98.3 F (36.8 C) (12/15 1231) Pulse Rate:  [88-95] 88 (12/15 0341) Resp:  [14-24] 15 (12/15 1231) BP: (106-130)/(83-93) 124/83 (12/15 1231) SpO2:  [96 %-99 %] 99 % (12/15 0341) Weight:  [67.5 kg] 67.5 kg (12/15 0341) Weight change: 0.7 kg Last BM Date: 09/23/19  PE: Sitting up on bed, not in distress GENERAL: Mild pallor ABDOMEN: 37 French gastrostomy tube in place, soft, nondistended abdomen EXTREMITIES: No deformity  Lab Results: Results for orders placed or performed during the hospital encounter of 09/03/19 (from the past 48 hour(s))  Glucose, capillary     Status: Abnormal   Collection Time: 09/23/19  3:54 PM  Result Value Ref Range   Glucose-Capillary 127 (H) 70 - 99 mg/dL   Comment 1 Notify RN    Comment 2 Document in Chart   Glucose, capillary     Status: None   Collection Time: 09/24/19 12:26 AM  Result Value Ref Range   Glucose-Capillary 90 70 - 99 mg/dL  Glucose, capillary     Status: Abnormal   Collection Time: 09/24/19  4:07 AM  Result Value Ref Range   Glucose-Capillary 101 (H) 70 - 99 mg/dL  Basic metabolic panel     Status: Abnormal   Collection Time: 09/24/19  5:00 AM  Result Value Ref Range   Sodium 139 135 - 145 mmol/L   Potassium 4.0 3.5 - 5.1 mmol/L   Chloride 107 98 - 111 mmol/L   CO2 25 22 - 32 mmol/L   Glucose, Bld 106 (H) 70 - 99 mg/dL   BUN 19 8 - 23 mg/dL   Creatinine, Ser 0.51 (L) 0.61 - 1.24 mg/dL   Calcium 8.2 (L) 8.9 - 10.3 mg/dL   GFR calc non Af Amer >60 >60 mL/min   GFR calc Af Amer >60 >60 mL/min   Anion gap 7 5 - 15    Comment: Performed at [facility], [place] [address], [place], [place] [address]  Glucose, capillary     Status: Abnormal   Collection Time: 09/24/19 11:32 AM  Result Value Ref Range    Glucose-Capillary 111 (H) 70 - 99 mg/dL   Comment 1 Notify RN    Comment 2 Document in Chart   Glucose, capillary     Status: Abnormal   Collection Time: 09/24/19  4:14 PM  Result Value Ref Range   Glucose-Capillary 104 (H) 70 - 99 mg/dL   Comment 1 Notify RN    Comment 2 Document in Chart   Glucose, capillary     Status: Abnormal   Collection Time: 09/24/19  7:57 PM  Result Value Ref Range   Glucose-Capillary 103 (H) 70 - 99 mg/dL  Glucose, capillary     Status: None   Collection Time: 09/24/19 11:32 PM  Result Value Ref Range   Glucose-Capillary 99 70 - 99 mg/dL  Glucose, capillary     Status: None   Collection Time: 09/25/19  3:43 AM  Result Value Ref Range   Glucose-Capillary 86 70 - 99 mg/dL  Glucose, capillary     Status: Abnormal   Collection Time: 09/25/19 12:32 PM  Result Value Ref Range   Glucose-Capillary 112 (H) 70 - 99 mg/dL   Comment 1 Notify RN    Comment 2 Document  in Chart     Studies/Results: DG Esophagus W/Water Sol CM  Result Date: 09/25/2019 CLINICAL DATA:  Esophageal perforation, status post surgical repair. EXAM: ESOPHOGRAM/BARIUM SWALLOW TECHNIQUE: Single contrast examination was performed using  water-soluble. FLUOROSCOPY TIME:  Fluoroscopy Time:  0 minutes and 48 seconds. Radiation Exposure Index (if provided by the fluoroscopic device): 75.3 mGy Number of Acquired Spot Images: COMPARISON:  09/20/2019. FINDINGS: Similar to prior exam, there is a tight stricture in the distal third of the esophagus through which only a trace amount of contrast passes after repeated swallows. Patient was given a total of approximately 20 cc water-soluble contrast material. Overall imaging appearance is very similar to the study from 09/20/2019. No contrast extravasation to suggest leak. IMPRESSION: Persistent tight distal esophageal stricture through which only a thin string of contrast passes with each swallow. Appearance is very similar to the prior study. No evidence for  contrast extravasation to suggest leak. Electronically Signed   By: Misty Stanley M.D.   On: 09/25/2019 12:09    Medications: I have reviewed the patient's current medications.  Assessment: Persistent tight distal esophageal stricture through which only a thin string of contrast passes with each swallow noted on water-soluble esophagogram today. No evidence for extravasation of contrast to suggest leak.  Last EGD was performed on 08/02/2019 and balloon dilatation was performed with 9 mm balloon, the scope could not be advanced beyond the strictured area.  Status post thoracotomy major for esophageal perforation with intercostal muscle flap performed on September 04, 2019.  Currently receiving nutrition via feeding tube  History of alcohol abuse on detox protocol  Normocytic anemia, thrombocytosis   Plan: Diagnostic EGD tomorrow, possible balloon dilatation, depending upon findings on endoscopy. Patient remains at high risk for further esophageal injury including perforation, history of recent esophageal perforation that required surgery. I have put in orders to hold morning dose of Lovenox and to hold feeding via PEG tube after midnight. The risks and the benefits of the procedure were discussed with the patient in details. He understands and verbalizes consent.  [name], MD 09/25/2019, 3:47 PM

## 2019-09-25 NOTE — Progress Notes (Addendum)
22 Days Post-Op Procedure(s) (LRB): THORACOTOMY MAJOR FOR ESOPHAGEAL PERFORATION WITH INTERCOSTAL MUSCLE FLAP (Right) Subjective: Awake and alert. He is attempting to swallow his clear liquid breakfast but he senses very little is passing through to his stomach. He has a sensation of fullness in he chest after swallowing and evacuates most of the liquid with the suction.  Pain control is adequate.   Objective: Vital signs in last 24 hours: Temp:  [97.9 F (36.6 C)-98.4 F (36.9 C)] 97.9 F (36.6 C) (12/15 0757) Pulse Rate:  [88-95] 88 (12/15 0341) Cardiac Rhythm: Normal sinus rhythm (12/14 2200) Resp:  [12-24] 15 (12/15 0757) BP: (106-130)/(84-97) 106/93 (12/15 0757) SpO2:  [96 %-99 %] 99 % (12/15 0341) Weight:  [67.5 kg] 67.5 kg (12/15 0341)    Intake/Output from previous day: 12/14 0701 - 12/15 0700 In: 700 [P.O.:100; NG/GT:600] Out: 850 [Urine:850] Intake/Output this shift: No intake/output data recorded.  Physical Exam: General appearance:alert, cooperative and no distress Heart:regular rhythm, mild S. Tach. Lungs:clear to auscultation bilaterally Abdomen:soft, non-tender; bowel sounds normal. G-J tube secure and TF infusing at 15ml/hr. Extremities:extremities normal, no edema   Lab Results: Recent Labs    09/23/19 0404  WBC 5.8  HGB 8.1*  HCT 26.7*  PLT 437*   BMET:  Recent Labs    09/23/19 0404 09/24/19 0500  NA 141 139  K 4.0 4.0  CL 106 107  CO2 27 25  GLUCOSE 111* 106*  BUN 15 19  CREATININE 0.44* 0.51*  CALCIUM 8.6* 8.2*    PT/INR: No results for input(s): LABPROT, INR in the last 72 hours. ABG    Component Value Date/Time   PHART 7.432 09/14/2019 1405   HCO3 24.1 09/14/2019 1405   TCO2 28 09/04/2019 1241   ACIDBASEDEF 6.0 (H) 09/04/2019 0522   O2SAT 95.4 09/14/2019 1405   CBG (last 3)  Recent Labs    09/24/19 1957 09/24/19 2332 09/25/19 0343  GLUCAP 103* 99 86    Assessment/Plan: S/P Procedure(s) (LRB): THORACOTOMY MAJOR  FOR ESOPHAGEAL PERFORATION WITH INTERCOSTAL MUSCLE FLAP (Right)  -POD-21right thoracotomy, repair of esophogeal perforation with intercostal muscle flap. Afebrile and WBC remainsnormal. Appreciate input from GI service. Repeat esophogram ordered for today and possible esophageal dilation by GI team if appropriate. Leave CT's in place until after a decision is made regarding that procedure May continueclear liquids PICC placed 12//9, site looks good.   -Nutrition- TF is infusing at goal rateof 13ml hrwith no sign of obstruction.Continue support.  -EtOH abuse-on detox protocol / Precedex early post-op.Now calm and cooperative.Continue Seroquel  -Mild anemia- Hct.trending down slightly but no indication for transfusion. Monitor.  -Hypokalemia-Replacement given, K+4.0yesterday. Monitor  -H/O PE prior to surgery. On full dose Lovenox 65mg  [place] q12H.    LOS: 22 days    [name], PA-C [phone] 09/25/2019   DG Esophagus W/Water Raelene Bott CM  Result Date: 09/25/2019 CLINICAL DATA:  Esophageal perforation, status post surgical repair. EXAM: ESOPHOGRAM/BARIUM SWALLOW TECHNIQUE: Single contrast examination was performed using  water-soluble. FLUOROSCOPY TIME:  Fluoroscopy Time:  0 minutes and 48 seconds. Radiation Exposure Index (if provided by the fluoroscopic device): 75.3 mGy Number of Acquired Spot Images: COMPARISON:  09/20/2019. FINDINGS: Similar to prior exam, there is a tight stricture in the distal third of the esophagus through which only a trace amount of contrast passes after repeated swallows. Patient was given a total of approximately 20 cc water-soluble contrast material. Overall imaging appearance is very similar to the study from 09/20/2019. No contrast extravasation to  suggest leak. IMPRESSION: Persistent tight distal esophageal stricture through which only a thin string of contrast passes with each swallow. Appearance is very similar to the prior  study. No evidence for contrast extravasation to suggest leak. Electronically Signed   By: Misty Stanley M.D.   On: 09/25/2019 12:09   Swallow unchanged  Esophageal dilation by GI  I have seen and examined [name]. and agree with the above assessment  and plan.  Grace Isaac MD [facility]per [phone] Office [phone] 09/25/2019 1:33 PM

## 2019-09-26 ENCOUNTER — Other Ambulatory Visit: Payer: Self-pay | Admitting: Thoracic Surgery (Cardiothoracic Vascular Surgery)

## 2019-09-26 ENCOUNTER — Inpatient Hospital Stay (HOSPITAL_COMMUNITY): Payer: Self-pay | Admitting: Certified Registered Nurse Anesthetist

## 2019-09-26 ENCOUNTER — Encounter (HOSPITAL_COMMUNITY): Payer: Self-pay | Admitting: Thoracic Surgery (Cardiothoracic Vascular Surgery)

## 2019-09-26 ENCOUNTER — Inpatient Hospital Stay (HOSPITAL_COMMUNITY): Payer: Self-pay

## 2019-09-26 ENCOUNTER — Encounter (HOSPITAL_COMMUNITY)
Disposition: A | Discharge status: Skilled Nursing Facility | Payer: Self-pay | Source: Other Acute Inpatient Hospital | Attending: Thoracic Surgery (Cardiothoracic Vascular Surgery)

## 2019-09-26 DIAGNOSIS — K223 Perforation of esophagus: Secondary | ICD-10-CM

## 2019-09-26 HISTORY — PX: ESOPHAGOGASTRODUODENOSCOPY (EGD) WITH PROPOFOL: SHX5813

## 2019-09-26 HISTORY — PX: BALLOON DILATION: SHX5330

## 2019-09-26 LAB — CBC
HCT: 28.1 % — ABNORMAL LOW (ref 39.0–52.0)
Hemoglobin: 8.5 g/dL — ABNORMAL LOW (ref 13.0–17.0)
MCH: 25.8 pg — ABNORMAL LOW (ref 26.0–34.0)
MCHC: 30.2 g/dL (ref 30.0–36.0)
MCV: 85.2 fL (ref 80.0–100.0)
Platelets: 422 10*3/uL — ABNORMAL HIGH (ref 150–400)
RBC: 3.3 MIL/uL — ABNORMAL LOW (ref 4.22–5.81)
RDW: 17.7 % — ABNORMAL HIGH (ref 11.5–15.5)
WBC: 8.2 10*3/uL (ref 4.0–10.5)
nRBC: 0 % (ref 0.0–0.2)

## 2019-09-26 LAB — COMPREHENSIVE METABOLIC PANEL
ALT: 12 U/L (ref 0–44)
AST: 12 U/L — ABNORMAL LOW (ref 15–41)
Albumin: 2.2 g/dL — ABNORMAL LOW (ref 3.5–5.0)
Alkaline Phosphatase: 140 U/L — ABNORMAL HIGH (ref 38–126)
Anion gap: 8 (ref 5–15)
BUN: 15 mg/dL (ref 8–23)
CO2: 26 mmol/L (ref 22–32)
Calcium: 9 mg/dL (ref 8.9–10.3)
Chloride: 104 mmol/L (ref 98–111)
Creatinine, Ser: 0.61 mg/dL (ref 0.61–1.24)
GFR calc Af Amer: >60 mL/min (ref >60)
GFR calc non Af Amer: >60 mL/min (ref >60)
Glucose, Bld: 83 mg/dL (ref 70–99)
Potassium: 4 mmol/L (ref 3.5–5.1)
Sodium: 138 mmol/L (ref 135–145)
Total Bilirubin: <0.1 mg/dL — ABNORMAL LOW (ref 0.3–1.2)
Total Protein: 5.7 g/dL — ABNORMAL LOW (ref 6.5–8.1)

## 2019-09-26 LAB — GLUCOSE, CAPILLARY
Glucose-Capillary: 107 mg/dL — ABNORMAL HIGH (ref 70–99)
Glucose-Capillary: 110 mg/dL — ABNORMAL HIGH (ref 70–99)
Glucose-Capillary: 134 mg/dL — ABNORMAL HIGH (ref 70–99)
Glucose-Capillary: 79 mg/dL (ref 70–99)
Glucose-Capillary: 93 mg/dL (ref 70–99)

## 2019-09-26 SURGERY — ESOPHAGOGASTRODUODENOSCOPY (EGD) WITH PROPOFOL
Anesthesia: Monitor Anesthesia Care

## 2019-09-26 MED ORDER — LACTATED RINGERS IV SOLN: INTRAVENOUS | Status: DC | PRN

## 2019-09-26 MED ORDER — ONDANSETRON HCL 4 MG/2ML IJ SOLN
INTRAMUSCULAR | Status: AC
  Filled 2019-09-26: qty 2

## 2019-09-26 MED ORDER — PROPOFOL 10 MG/ML IV BOLUS
INTRAVENOUS | Status: DC | PRN
  Administered 2019-09-26 (×2): 20 mg via INTRAVENOUS

## 2019-09-26 MED ORDER — SODIUM CHLORIDE 0.9 % IV SOLN: INTRAVENOUS | Status: DC

## 2019-09-26 MED ORDER — IOHEXOL 300 MG/ML  SOLN
INTRAMUSCULAR | Status: DC | PRN
  Administered 2019-09-26: 10 mL

## 2019-09-26 MED ORDER — PROPOFOL 500 MG/50ML IV EMUL
INTRAVENOUS | Status: DC | PRN
  Administered 2019-09-26: 100 ug/kg/min via INTRAVENOUS

## 2019-09-26 SURGICAL SUPPLY — 15 items

## 2019-09-26 NOTE — Anesthesia Preprocedure Evaluation (Addendum)
Anesthesia Evaluation  Patient identified by MRN, date of birth, ID band Patient awake    Reviewed: Allergy & Precautions, NPO status , Patient's Chart, lab work & pertinent test results  Airway Mallampati: II  TM Distance: >3 FB Neck ROM: Full    Dental  (+) Teeth Intact, Dental Advisory Given, Chipped,    Pulmonary neg pulmonary ROS,    Pulmonary exam normal breath sounds clear to auscultation       Cardiovascular hypertension, Normal cardiovascular exam Rhythm:Regular Rate:Normal     Neuro/Psych PSYCHIATRIC DISORDERS negative neurological ROS     GI/Hepatic (+)     substance abuse  alcohol use, esophageal stricture, esophageal perforation/Booerhave's, s/p repair, abnormal barium swallow   Endo/Other  negative endocrine ROS  Renal/GU negative Renal ROS     Musculoskeletal negative musculoskeletal ROS (+)   Abdominal   Peds  Hematology  (+) Blood dyscrasia, anemia ,   Anesthesia Other Findings Day of surgery medications reviewed with the patient.  Reproductive/Obstetrics                             Anesthesia Physical Anesthesia Plan  ASA: III  Anesthesia Plan: MAC   Post-op Pain Management:    Induction: Intravenous  PONV Risk Score and Plan: 1 and Propofol infusion and Treatment may vary due to age or medical condition  Airway Management Planned: Nasal Cannula  Additional Equipment:   Intra-op Plan:   Post-operative Plan:   Informed Consent: I have reviewed the patients History and Physical, chart, labs and discussed the procedure including the risks, benefits and alternatives for the proposed anesthesia with the patient or authorized representative who has indicated his/her understanding and acceptance.     Dental advisory given  Plan Discussed with: CRNA and Anesthesiologist  Anesthesia Plan Comments: (Discussed risks/benefits/alternatives to MAC sedation  including need for ventilatory support, hypotension, need for conversion to general anesthesia.  All patient questions answered.  Patient/guardian wishes to proceed.)        Anesthesia Quick Evaluation

## 2019-09-26 NOTE — Interval H&P Note (Signed)
History and Physical Interval Note: 63/male with esophageal stricture, esophageal perforation, s/p thoracotomy and repair 3 weeks ago, PEG in place for feeding for EGD for possible dilation of stricture. This is a high risk procedure and is being done after discussion with cardiothoracic surgical team.  09/26/2019 2:35 PM  George Wade.  has presented today for Egd with dilation, with the diagnosis of esophageal stricture, esophageal perforation/Booerhave's, s/p repair, abnormal barium swallow.  The various methods of treatment have been discussed with the patient and family. After consideration of risks, benefits and other options for treatment, the patient has consented to  Procedure(s): ESOPHAGOGASTRODUODENOSCOPY (EGD) WITH PROPOFOL (N/A) as a surgical intervention.  The patient's history has been reviewed, patient examined, no change in status, stable for surgery.  I have reviewed the patient's chart and labs.  Questions were answered to the patient's satisfaction.     [name]

## 2019-09-26 NOTE — Op Note (Addendum)
[facility] Patient Name: George Wade Procedure Date : 09/26/2019 MRN: [id number] Attending MD: [name] , MD Date of Birth: April 21, 1956 CSN: [id number] Age: 63 Admit Type: Inpatient Procedure:                Upper GI endoscopy Indications:              For therapy of esophageal stenosis, recent                            esophageal perforation requiring thoracotomy and                            repair Providers:                [name], MD, Clarene Essex MD Carlyn Reichert, RN,                            Lina Sar, Technician, Rejeana Brock, CRNA Referring MD:             Sadie Haber Medicines:                Monitored Anesthesia Care Complications:            No immediate complications. Estimated blood loss:                            Minimal. Estimated Blood Loss:     Estimated blood loss was minimal. Procedure:                Pre-Anesthesia Assessment:                           - Prior to the procedure, a History and Physical                            was performed, and patient medications and                            allergies were reviewed. The patient's tolerance of                            previous anesthesia was also reviewed. The risks                            and benefits of the procedure and the sedation                            options and risks were discussed with the patient.                            All questions were answered, and informed consent                            was obtained. Prior Anticoagulants: The patient has  taken no previous anticoagulant or antiplatelet                            agents. ASA Grade Assessment: III - A patient with                            severe systemic disease. After reviewing the risks                            and benefits, the patient was deemed in                            satisfactory condition to undergo the procedure.                           - Prior to  the procedure, a History and Physical                            was performed, and patient medications and                            allergies were reviewed. The patient's tolerance of                            previous anesthesia was also reviewed. The risks                            and benefits of the procedure and the sedation                            options and risks were discussed with the patient.                            All questions were answered, and informed consent                            was obtained. Prior Anticoagulants: The patient has                            taken no previous anticoagulant or antiplatelet                            agents. ASA Grade Assessment: III - A patient with                            severe systemic disease. After reviewing the risks                            and benefits, the patient was deemed in                            satisfactory condition to undergo the procedure.  After obtaining informed consent, the endoscope was                            passed under direct vision. Throughout the                            procedure, the patient's blood pressure, pulse, and                            oxygen saturations were monitored continuously. The                            [id number]-[id number] ([id number]) Olympus gastroscope was                            introduced through the mouth, and advanced to the                            lower third of esophagus. The [id number]-[id number] ([id number])                            Olympus Ultraslim gastroscope was introduced                            through the mouth, and advanced to the second part                            of duodenum. The upper GI endoscopy was technically                            difficult and complex due to abnormal anatomy. The                            patient tolerated the procedure well. Scope In: Scope Out: Findings:      One benign-appearing, intrinsic  severe (stenosis; an endoscope cannot       pass) stenosis was found 30 cm from the incisors. This stenosis measured       2 mm (inner diameter).      The adult gastroscope was exchanged with an ultrathin endoscope,       however, it could not be advanced beyond the area of stenosis.      The stenosis was traversed after dilation. A TTS dilator was passed       through the scope. Dilation with an 6-7-8 mm and then a 05-19-09 mm x 5.5       cm CRE balloon dilator was performed to 10 mm under fluoroscopic       guidance. The dilation site was examined following endoscope reinsertion       and showed moderate mucosal disruption and complete resolution of       luminal narrowing.      Post dilation, adult gastroscope could not be advanced. However,       ultrathin scope could be advanced to visualize the rest of the gastric       cavity. Contrast was injected to confirm no extraluminal extravasation.  There was evidence of a gastrostomy present in the gastric body.      The examined duodenum was normal. Impression:               - Benign-appearing esophageal stenosis. Dilated.                           - Gastrostomy present.                           - Normal examined duodenum.                           - No specimens collected. Moderate Sedation:      Patient did not receive moderate sedation for this procedure, but       instead received monitored anesthesia care. Recommendation:           - NPO today.                           - Clear liquid diet in am.                           - Continue present medications.                           - Repeat upper endoscopy in 1 week for retreatment. Procedure Code(s):        --- Professional ---                           [phone], Esophagogastroduodenoscopy, flexible,                            transoral; with transendoscopic balloon dilation of                            esophagus (less than 30 mm diameter)                           74360,  Intraluminal dilation of strictures and/or                            obstructions (eg, esophagus), radiological                            supervision and interpretation Diagnosis Code(s):        --- Professional ---                           K22.2, Esophageal obstruction                           Z93.1, Gastrostomy status CPT copyright 2019 American [employer] Association. All rights reserved. The codes documented in this report are preliminary and upon coder review may  be revised to meet current compliance requirements. [name], MD 09/26/2019 4:07:35 PM This report has been signed electronically. Number of Addenda: 0

## 2019-09-26 NOTE — Progress Notes (Addendum)
23 Days Post-Op Procedure(s) (LRB): THORACOTOMY MAJOR FOR ESOPHAGEAL PERFORATION WITH INTERCOSTAL MUSCLE FLAP (Right) Subjective: Awake and alert. Pain level at the CT insertion site is about the same. Says toradol has not been helpful.  He is anxious about the planned EGD / possible esophageal dilation procedure.   Objective: Vital signs in last 24 hours: Temp:  [98.1 F (36.7 C)-98.3 F (36.8 C)] 98.1 F (36.7 C) (12/16 0758) Pulse Rate:  [98-113] 104 (12/16 0417) Cardiac Rhythm: Sinus tachycardia (12/16 0700) Resp:  [12-21] 12 (12/16 0758) BP: (110-124)/(83-90) 115/89 (12/16 0417) SpO2:  [95 %-98 %] 95 % (12/16 0417) Weight:  [67 kg] 67 kg (12/16 0500)  Intake/Output from previous day: 12/15 0701 - 12/16 0700 In: 1589 [NG/GT:1589] Out: 1276 [Urine:1275; Stool:1] Intake/Output this shift: No intake/output data recorded.   Physical Exam: General appearance:alert, cooperative and no distress Heart:regular rhythm, mild S. Tach. Lungs:clear to auscultation bilaterally Abdomen:soft, non-tender; bowel sounds normal. G-J tube secure and TF is off for planned procedure Extremities:extremities normal, no edema  Lab Results: Recent Labs    09/26/19 0400  WBC 8.2  HGB 8.5*  HCT 28.1*  PLT 422*   BMET:  Recent Labs    09/24/19 0500 09/26/19 0400  NA 139 138  K 4.0 4.0  CL 107 104  CO2 25 26  GLUCOSE 106* 83  BUN 19 15  CREATININE 0.51* 0.61  CALCIUM 8.2* 9.0    PT/INR: No results for input(s): LABPROT, INR in the last 72 hours. ABG    Component Value Date/Time   PHART 7.432 09/14/2019 1405   HCO3 24.1 09/14/2019 1405   TCO2 28 09/04/2019 1241   ACIDBASEDEF 6.0 (H) 09/04/2019 0522   O2SAT 95.4 09/14/2019 1405   CBG (last 3)  Recent Labs    09/25/19 1931 09/26/19 0016 09/26/19 0418  GLUCAP 116* 134* 48    Assessment/Plan: S/P Procedure(s) (LRB): THORACOTOMY MAJOR FOR ESOPHAGEAL PERFORATION WITH INTERCOSTAL MUSCLE FLAP (Right)  -POD-22right  thoracotomy, repair of esophogeal perforation with intercostal muscle flap. Afebrile and WBC remainsnormal. Appreciate input from GI service. Repeat esophogram yesterday essentially unchanged--tight distal esophageal stricture and no evidence of leak.  GI service has scheduled EGD, possible balloon dilation of esophagus later today. Leave CT'sin place until after that procedure TF on hold, Lovenox held. PICC placed 12//9, site looks good.  -Nutrition- he has been toleratingTF at goal rateof 36ml hrwith no sign of obstruction.On hold for EGD  -EtOH abuse-on detox protocol / Precedex early post-op.Now calm and cooperative.Continue Seroquel  -Mild anemia- Hct.is stable. Monitor.  -Hypokalemia-Resolved.Monitor  -H/O PE prior to surgery. On full dose Lovenox 65mg  [place] q12H. On hold for EGD / possible dilation.    LOS: 23 days    [name], Hershal Coria [phone] 09/26/2019  Having esophageal dilation now I have seen and examined George Wade. and agree with the above assessment  and plan.  Grace Isaac MD Beeper [phone] Office [phone] 09/26/2019 2:47 PM

## 2019-09-26 NOTE — Anesthesia Postprocedure Evaluation (Signed)
Anesthesia Post Note  Patient: George Wade.  Procedure(s) Performed: ESOPHAGOGASTRODUODENOSCOPY (EGD) WITH PROPOFOL (N/A ) BALLOON DILATION (N/A )     Anesthesia Post Evaluation  Last Vitals:  Vitals:   09/26/19 1206 09/26/19 1414  BP:  122/89  Pulse:  100  Resp: 17 (!) 25  Temp:  37.1 C  SpO2:  97%    Last Pain:  Vitals:   09/26/19 1414  TempSrc: Oral  PainSc: 6                  Coleen Cardiff

## 2019-09-26 NOTE — Progress Notes (Signed)
Patient came back from Endo room. There was no mention about tube feeding is okay or not. Paged Dr. Therisa Doyne for clarifying NPO order. No tube feeding today as well as oral food. Patient asked for ice chips. Explained him regarding this matter, but he insisted to hear that several person told him that he can have ice chips and ice water after midnight. Dr. Therisa Doyne wanted to NPO until getting the test result back. Makes sure no leaking on esophageal area. Explained patient what Dr. Therisa Doyne told. He understood it. HS [employer]

## 2019-09-26 NOTE — Op Note (Signed)
EGD findings:  Severe stenosis noted at 30 cm from insertion. Adult gastroscope was exchanged with an ultrathin scope, which could not be advanced beyond the stenosed area. Under fluoroscopy guidance, guidewire assistance, balloon dilatation was performed using 6 mm, 7 mm, 8 mm, 9 mm and ultimately 10 mm balloon. Post dilation, ultrathin scope could be advanced into the gastric cavity. Gastrostomy tube was noted. Duodenal bulb and rest of the duodenum appeared unremarkable. Contrast was injected at the site of dilation, there was no evidence of extraluminal extravasation.   Recommendations: PPI to be continued. N.p.o. for now, likely clear liquid diet in a.m.Marland Kitchen Patient will likely require repeat EGD and dilation within a week.  [name], MD

## 2019-09-26 NOTE — Transfer of Care (Signed)
Immediate Anesthesia Transfer of Care Note  Patient: George Wade.  Procedure(s) Performed: ESOPHAGOGASTRODUODENOSCOPY (EGD) WITH PROPOFOL (N/A ) BALLOON DILATION (N/A ) SCLEROTHERAPY  Patient Location: PACU and Endoscopy Unit  Anesthesia Type:MAC  Level of Consciousness: awake and alert   Airway & Oxygen Therapy: Patient Spontanous Breathing and Patient connected to nasal cannula oxygen  Post-op Assessment: Report given to RN and Post -op Vital signs reviewed and stable  Post vital signs: Reviewed and stable  Last Vitals:  Vitals Value Taken Time  BP 118/79 09/26/19 1610  Temp    Pulse 94 09/26/19 1610  Resp 28 09/26/19 1610  SpO2 100 % 09/26/19 1610    Last Pain:  Vitals:   09/26/19 1610  TempSrc: Temporal  PainSc: 4       Patients Stated Pain Goal: 0 (85/27/78 2423)  Complications: No apparent anesthesia complications

## 2019-09-26 NOTE — Brief Op Note (Signed)
09/03/2019 - 09/26/2019  4:07 PM  PATIENT:  George Wade.  63 y.o. male  PRE-OPERATIVE DIAGNOSIS:  esophageal stricture, esophageal perforation/Booerhave's, s/p repair, abnormal barium swallow  POST-OPERATIVE DIAGNOSIS:  [place] point Esophageal stricture unable to pass ultra slim EGD scop, ballon dilation, hiatal hernia,   PROCEDURE:  Procedure(s): ESOPHAGOGASTRODUODENOSCOPY (EGD) WITH PROPOFOL (N/A) BALLOON DILATION (N/A) SCLEROTHERAPY  SURGEON:  Surgeon(s) and Role:    [name], MD - Primary  PHYSICIAN ASSISTANT:   ASSISTANTS: Kingsley Plan, RN, Lina Sar, Lazaro Arms, Tech  ANESTHESIA:   MAC  EBL: Minimal  BLOOD ADMINISTERED:none  DRAINS: none   LOCAL MEDICATIONS USED:  NONE  SPECIMEN:  No Specimen  DISPOSITION OF SPECIMEN:  N/A  COUNTS:  YES  TOURNIQUET:  * No tourniquets in log *  DICTATION: .Dragon Dictation  PLAN OF CARE: Admit to inpatient   PATIENT DISPOSITION:  PACU - hemodynamically stable.   Delay start of Pharmacological VTE agent (>24hrs) due to surgical blood loss or risk of bleeding: yes

## 2019-09-27 LAB — GLUCOSE, CAPILLARY
Glucose-Capillary: 103 mg/dL — ABNORMAL HIGH (ref 70–99)
Glucose-Capillary: 113 mg/dL — ABNORMAL HIGH (ref 70–99)
Glucose-Capillary: 124 mg/dL — ABNORMAL HIGH (ref 70–99)
Glucose-Capillary: 148 mg/dL — ABNORMAL HIGH (ref 70–99)
Glucose-Capillary: 80 mg/dL (ref 70–99)
Glucose-Capillary: 98 mg/dL (ref 70–99)

## 2019-09-27 MED ORDER — OSMOLITE 1.5 CAL PO LIQD
1000.0000 mL | ORAL | Status: DC
  Administered 2019-09-27 – 2019-09-28 (×2): 1000 mL
  Filled 2019-09-27 (×4): qty 1000

## 2019-09-27 NOTE — Progress Notes (Addendum)
1 Day Post-Op Procedure(s) (LRB): ESOPHAGOGASTRODUODENOSCOPY (EGD) WITH PROPOFOL (N/A) BALLOON DILATION (N/A) Subjective: Awake and alert, says he is very thirsty and wants to resume PO's.   Objective: Vital signs in last 24 hours: Temp:  [97.8 F (36.6 C)-98.8 F (37.1 C)] 97.8 F (36.6 C) (12/17 0359) Pulse Rate:  [83-100] 83 (12/17 0300) Cardiac Rhythm: Normal sinus rhythm (12/17 0701) Resp:  [13-28] 16 (12/17 0300) BP: (84-131)/(59-101) 101/76 (12/17 0359) SpO2:  [94 %-100 %] 94 % (12/17 0300) Weight:  [67.9 kg] 67.9 kg (12/17 0414)      Intake/Output from previous day: 12/16 0701 - 12/17 0700 In: 55 [I.V.:610] Out: 865 [Urine:725; Chest Tube:140] Intake/Output this shift: No intake/output data recorded.  Physical Exam: General appearance:alert, cooperative and mild distress Heart:regular rate and rhythm Lungs:breath sounds are clear. Abdomen:soft, non-tender;G-J tube secure and TF is off since the EGD dilation yesterday.  Extremities:extremities normal, no edema   Lab Results: Recent Labs    09/26/19 0400  WBC 8.2  HGB 8.5*  HCT 28.1*  PLT 422*   BMET:  Recent Labs    09/26/19 0400  NA 138  K 4.0  CL 104  CO2 26  GLUCOSE 83  BUN 15  CREATININE 0.61  CALCIUM 9.0    PT/INR: No results for input(s): LABPROT, INR in the last 72 hours. ABG    Component Value Date/Time   PHART 7.432 09/14/2019 1405   HCO3 24.1 09/14/2019 1405   TCO2 28 09/04/2019 1241   ACIDBASEDEF 6.0 (H) 09/04/2019 0522   O2SAT 95.4 09/14/2019 1405   CBG (last 3)  Recent Labs    09/26/19 1956 09/27/19 0004 09/27/19 0410  GLUCAP 107* 103* 80    Assessment/Plan: S/P Procedure(s) (LRB): ESOPHAGOGASTRODUODENOSCOPY (EGD) WITH PROPOFOL (N/A) BALLOON DILATION (N/A)  -POD-23right thoracotomy, repair of esophogeal perforation with intercostal muscle flap. Afebrile and WBC remainsnormal. Appreciate input from GI service. Had EGD with dilation of distal esophageal  stricture to 63mm yesterday. Post-dilation esophogram showed no evidence of leak.  Resume PO's and nutrition support when OK with GI.  Will remove chest tubes today. Lab and CXR in AM.  PICC placed 12//9, site looks good.  -Nutrition- he has been toleratingTF at goal rateof 57ml hrwith no sign of obstruction.On hold per GI.  -EtOH abuse-on detox protocol / Precedex early post-op.Now calm and cooperative.Continue Seroquel  -Mild anemia- Hct.is stable. Monitor.  -Hypokalemia-Resolved.Monitor  -H/O PE prior to surgery. On full dose Lovenox 65mg  [place] q12H. On held for EGD / dilation but has been resumed.    LOS: 24 days    [name], PA-C [phone] 09/27/2019 Patient seen and examined, agree with above Clears today per GI Will dc tubes later today if no issues once clears started  [name], MD [facility] [phone]

## 2019-09-27 NOTE — Progress Notes (Signed)
Subjective: Patient doing well post dilation of esophageal stricture. He is thirsty and is asking for ice chips and liquid diet. Reports a bowel movement today morning. Denies nausea, vomiting or abdominal pain.  Objective: Vital signs in last 24 hours: Temp:  [97.8 F (36.6 C)-98.8 F (37.1 C)] 97.8 F (36.6 C) (12/17 0359) Pulse Rate:  [83-100] 83 (12/17 0300) Resp:  [13-28] 16 (12/17 0300) BP: (84-131)/(59-101) 101/76 (12/17 0359) SpO2:  [94 %-100 %] 94 % (12/17 0300) Weight:  [67.9 kg] 67.9 kg (12/17 0414) Weight change: 0.9 kg Last BM Date: 09/25/19  PE: Not in distress, thinly built GENERAL: Mild pallor, no icterus ABDOMEN: Feeding tube in place, chest tube in place, soft, nondistended, nontender, normoactive bowel sounds EXTREMITIES: No deformity  Lab Results: Results for orders placed or performed during the hospital encounter of 09/03/19 (from the past 48 hour(s))  Glucose, capillary     Status: Abnormal   Collection Time: 09/25/19 12:32 PM  Result Value Ref Range   Glucose-Capillary 112 (H) 70 - 99 mg/dL   Comment 1 Notify RN    Comment 2 Document in Chart   Glucose, capillary     Status: Abnormal   Collection Time: 09/25/19  5:03 PM  Result Value Ref Range   Glucose-Capillary 107 (H) 70 - 99 mg/dL   Comment 1 Notify RN    Comment 2 Document in Chart   Glucose, capillary     Status: Abnormal   Collection Time: 09/25/19  7:31 PM  Result Value Ref Range   Glucose-Capillary 116 (H) 70 - 99 mg/dL   Comment 1 Notify RN    Comment 2 Document in Chart   Glucose, capillary     Status: Abnormal   Collection Time: 09/26/19 12:16 AM  Result Value Ref Range   Glucose-Capillary 134 (H) 70 - 99 mg/dL   Comment 1 Notify RN    Comment 2 Document in Chart   Comprehensive metabolic panel     Status: Abnormal   Collection Time: 09/26/19  4:00 AM  Result Value Ref Range   Sodium 138 135 - 145 mmol/L   Potassium 4.0 3.5 - 5.1 mmol/L   Chloride 104 98 - 111 mmol/L   CO2  26 22 - 32 mmol/L   Glucose, Bld 83 70 - 99 mg/dL   BUN 15 8 - 23 mg/dL   Creatinine, Ser 0.61 0.61 - 1.24 mg/dL   Calcium 9.0 8.9 - 10.3 mg/dL   Total Protein 5.7 (L) 6.5 - 8.1 g/dL   Albumin 2.2 (L) 3.5 - 5.0 g/dL   AST 12 (L) 15 - 41 U/L   ALT 12 0 - 44 U/L   Alkaline Phosphatase 140 (H) 38 - 126 U/L   Total Bilirubin <0.1 (L) 0.3 - 1.2 mg/dL   GFR calc non Af Amer >60 >60 mL/min   GFR calc Af Amer >60 >60 mL/min   Anion gap 8 5 - 15    Comment: Performed at [facility], [address], [place], [place] [address]  CBC     Status: Abnormal   Collection Time: 09/26/19  4:00 AM  Result Value Ref Range   WBC 8.2 4.0 - 10.5 K/uL   RBC 3.30 (L) 4.22 - 5.81 MIL/uL   Hemoglobin 8.5 (L) 13.0 - 17.0 g/dL   HCT 28.1 (L) 39.0 - 52.0 %   MCV 85.2 80.0 - 100.0 fL   MCH 25.8 (L) 26.0 - 34.0 pg   MCHC 30.2 30.0 -  36.0 g/dL   RDW 17.7 (H) 11.5 - 15.5 %   Platelets 422 (H) 150 - 400 K/uL   nRBC 0.0 0.0 - 0.2 %    Comment: Performed at [facility], [address] [place], [place] [address]  Glucose, capillary     Status: None   Collection Time: 09/26/19  4:18 AM  Result Value Ref Range   Glucose-Capillary 79 70 - 99 mg/dL   Comment 1 Notify RN    Comment 2 Document in Chart   Glucose, capillary     Status: Abnormal   Collection Time: 09/26/19 10:39 AM  Result Value Ref Range   Glucose-Capillary 110 (H) 70 - 99 mg/dL   Comment 1 Notify RN    Comment 2 Document in Chart   Glucose, capillary     Status: None   Collection Time: 09/26/19  4:55 PM  Result Value Ref Range   Glucose-Capillary 93 70 - 99 mg/dL   Comment 1 Notify RN    Comment 2 Document in Chart   Glucose, capillary     Status: Abnormal   Collection Time: 09/26/19  7:56 PM  Result Value Ref Range   Glucose-Capillary 107 (H) 70 - 99 mg/dL  Glucose, capillary     Status: Abnormal   Collection Time: 09/27/19 12:04 AM  Result Value Ref Range   Glucose-Capillary 103 (H) 70 - 99 mg/dL   Comment 1 Notify RN     Comment 2 Document in Chart   Glucose, capillary     Status: None   Collection Time: 09/27/19  4:10 AM  Result Value Ref Range   Glucose-Capillary 80 70 - 99 mg/dL   Comment 1 Notify RN    Comment 2 Document in Chart     Studies/Results: DG ESOPHAGUS DILATION  Result Date: 09/26/2019 ESOPHAGEAL DILATATION: Fluoroscopy was provided for use by the requesting physician.  No images were obtained for radiographic interpretation.  DG Esophagus W/Water Sol CM  Result Date: 09/25/2019 CLINICAL DATA:  Esophageal perforation, status post surgical repair. EXAM: ESOPHOGRAM/BARIUM SWALLOW TECHNIQUE: Single contrast examination was performed using  water-soluble. FLUOROSCOPY TIME:  Fluoroscopy Time:  0 minutes and 48 seconds. Radiation Exposure Index (if provided by the fluoroscopic device): 75.3 mGy Number of Acquired Spot Images: COMPARISON:  09/20/2019. FINDINGS: Similar to prior exam, there is a tight stricture in the distal third of the esophagus through which only a trace amount of contrast passes after repeated swallows. Patient was given a total of approximately 20 cc water-soluble contrast material. Overall imaging appearance is very similar to the study from 09/20/2019. No contrast extravasation to suggest leak. IMPRESSION: Persistent tight distal esophageal stricture through which only a thin string of contrast passes with each swallow. Appearance is very similar to the prior study. No evidence for contrast extravasation to suggest leak. Electronically Signed   By: Misty Stanley M.D.   On: 09/25/2019 12:09    Medications: I have reviewed the patient's current medications.  Assessment: Esophageal perforation status post thoracotomy and repair Esophageal stricture, status post dilation with 6/7/8/9 and 10 mm balloon yesterday under fluoroscopy guidance by Dr. Watt Climes  Plan: Start clear liquid diet. Resume tube feedings. Plan for endoscopy early next week, possible repeat dilation with a larger  balloon depending upon endoscopy findings.  [name], MD 09/27/2019, 8:15 AM

## 2019-09-27 NOTE — Progress Notes (Signed)
Both chest tubes pulled.  Posterior chest tube had a knot in the sutures, it inhibited [place] being able to seal the insertion area.  Called PA on call, orders received to place a vaseline gauze on insertion site.  Will continue to monitor patient.   09/27/2019 4:33 PM Tilda Burrow [place]hart

## 2019-09-27 NOTE — Progress Notes (Signed)
PT Cancellation Note  Patient Details Name: George Wade. MRN: [id number] DOB: 1956/01/25   Cancelled Treatment:    Reason Eval/Treat Not Completed: Patient declined, no reason specified Pt reports he will leaving unit soon to have chest tube removed and would like to wait until tomorrow to participate in therapy. PT will continue to follow acutely.    Earney Navy, PTA Acute Rehabilitation Services Pager: [phone] Office: [phone]   09/27/2019, 2:14 PM

## 2019-09-27 NOTE — Progress Notes (Signed)
Chaplain responded to patient's request to see a chaplain.  During conversation, George Wade noted the different things that have occurred in his life and what he is currently facing.  Chaplain discussed with Mr. Gettel speaking with his nurse and having a social work consult put in to address his needs and worries about discharging from the hospital.  Bonney Roussel and Mr. Quaintance also prayed together.   Chaplain will follow-up.

## 2019-09-28 ENCOUNTER — Inpatient Hospital Stay (HOSPITAL_COMMUNITY): Payer: Self-pay

## 2019-09-28 LAB — GLUCOSE, CAPILLARY
Glucose-Capillary: 111 mg/dL — ABNORMAL HIGH (ref 70–99)
Glucose-Capillary: 118 mg/dL — ABNORMAL HIGH (ref 70–99)
Glucose-Capillary: 106 mg/dL — ABNORMAL HIGH (ref 70–99)

## 2019-09-28 LAB — CBC
HCT: 28.2 % — ABNORMAL LOW (ref 39.0–52.0)
Hemoglobin: 8.4 g/dL — ABNORMAL LOW (ref 13.0–17.0)
MCH: 25.5 pg — ABNORMAL LOW (ref 26.0–34.0)
MCHC: 29.8 g/dL — ABNORMAL LOW (ref 30.0–36.0)
MCV: 85.7 fL (ref 80.0–100.0)
Platelets: 400 10*3/uL (ref 150–400)
RBC: 3.29 MIL/uL — ABNORMAL LOW (ref 4.22–5.81)
RDW: 16.9 % — ABNORMAL HIGH (ref 11.5–15.5)
WBC: 8.6 10*3/uL (ref 4.0–10.5)
nRBC: 0 % (ref 0.0–0.2)

## 2019-09-28 LAB — BASIC METABOLIC PANEL
Anion gap: 8 (ref 5–15)
BUN: 13 mg/dL (ref 8–23)
CO2: 26 mmol/L (ref 22–32)
Calcium: 8.6 mg/dL — ABNORMAL LOW (ref 8.9–10.3)
Chloride: 100 mmol/L (ref 98–111)
Creatinine, Ser: 0.59 mg/dL — ABNORMAL LOW (ref 0.61–1.24)
GFR calc Af Amer: >60 mL/min (ref >60)
GFR calc non Af Amer: >60 mL/min (ref >60)
Glucose, Bld: 124 mg/dL — ABNORMAL HIGH (ref 70–99)
Potassium: 4 mmol/L (ref 3.5–5.1)
Sodium: 134 mmol/L — ABNORMAL LOW (ref 135–145)

## 2019-09-28 NOTE — TOC Progression Note (Signed)
Transition of Care (TOC) - Progression Note    Patient Details  Name: Euclide Granito. MRN: [id number] Date of Birth: 1956-02-11  Transition of Care [facility]) CM/SW [place], [place] Phone Number: 09/28/2019, 12:06 PM  Clinical Narrative:     CSW is discussing with [facility] supervisors about clear disposition plan for the patient. Patient is currently receiving nutrition through a peg tube and are advancing to a clear liquid diet. Patient is currently walking 400 feet with minimum assistance.   CSW called Epimenio Sarin with [facility] and left a message. CSW is awaiting a return phone call.   GI is planning to complete an endoscopy early next week.   CSW will continue to discuss further with [facility] staff about disposition.   Expected Discharge Plan: [place] Barriers to Discharge: Inadequate or no insurance, Continued Medical Work up  Expected Discharge Plan and Services Expected Discharge Plan: [place] arrangements for the past 2 months: Hotel/Motel                                       Social Determinants of Health (SDOH) Interventions    Readmission Risk Interventions No flowsheet data found.

## 2019-09-28 NOTE — Progress Notes (Signed)
Physical Therapy Treatment Patient Details Name: George Wade. MRN: [id number] DOB: 1956/02/11 Today's Date: 09/28/2019    History of Present Illness Pt is a 63 y.o. male admitted 09/03/19 with c/o R-side chest pain. CT showed R hydropneumothorax and extensive distal pneumomediastinum. Pt s/p R major thoracotomy for esophageal performation with intercostal muscle flap on 11/24. PMH includes alcohol abuse, non-Hodgkin's lymphoma, tonsillar cancer, HTN, PE, esophageal stricture. Of note, recent admission 07/2019-08/2019 for chest pain and alcohol-induced mood disorder.    PT Comments    Patient continues to make progress toward PT goals. Current plan remains appropriate..    Follow Up Recommendations  SNF;Supervision for mobility/OOB     Equipment Recommendations  Other (comment)(TBD next session)    Recommendations for Other Services       Precautions / Restrictions Precautions Precautions: Fall Precaution Comments: PEG    Mobility  Bed Mobility               General bed mobility comments: pt up with OT upon arrival  Transfers Overall transfer level: Modified independent                  Ambulation/Gait Ambulation/Gait assistance: Supervision Gait Distance (Feet): 500 Feet Assistive device: Rolling walker (2 wheeled) Gait Pattern/deviations: Step-through pattern Gait velocity: decreased  but improving    General Gait Details: overall steady gait; pt is guarded while mobilizing and appears anxious but tolerates mobility well; short break to let HR decrease (near 130 bpm)   Stairs             Wheelchair Mobility    Modified Rankin (Stroke Patients Only)       Balance Overall balance assessment: Needs assistance   Sitting balance-Leahy Scale: Good       Standing balance-Leahy Scale: Fair                              Cognition Arousal/Alertness: Awake/alert Behavior During Therapy: Anxious Overall Cognitive Status:  Impaired/Different from baseline Area of Impairment: Memory;Safety/judgement;Problem solving                     Memory: Decreased short-term memory   Safety/Judgement: Decreased awareness of safety;Decreased awareness of deficits   Problem Solving: Slow processing;Difficulty sequencing General Comments: pt concerned that he is not a good patient      Exercises      General Comments        Pertinent Vitals/Pain Pain Assessment: No/denies pain    Home Living                      Prior Function            PT Goals (current goals can now be found in the care plan section) Acute Rehab PT Goals Patient Stated Goal: to go to rehab and get stronger Progress towards PT goals: Progressing toward goals    Frequency    Min 3X/week      PT Plan Current plan remains appropriate    Co-evaluation              AM-PAC PT "6 Clicks" Mobility   Outcome Measure  Help needed turning from your back to your side while in a flat bed without using bedrails?: None Help needed moving from lying on your back to sitting on the side of a flat bed without using bedrails?: None Help needed moving to and from  a bed to a chair (including a wheelchair)?: A Little Help needed standing up from a chair using your arms (e.g., wheelchair or bedside chair)?: A Little Help needed to walk in hospital room?: A Little Help needed climbing 3-5 steps with a railing? : A Little 6 Click Score: 20    End of Session   Activity Tolerance: Patient tolerated treatment well Patient left: in chair;with call bell/phone within reach Nurse Communication: Mobility status PT Visit Diagnosis: Other abnormalities of gait and mobility (R26.89);Muscle weakness (generalized) (M62.81);Pain Pain - Right/Left: Right     Time: 1040-1100 PT Time Calculation (min) (ACUTE ONLY): 20 min  Charges:  $Gait Training: 8-22 mins                     Earney Navy, PTA Acute Rehabilitation  Services Pager: [phone] Office: [phone]     Darliss Cheney 09/28/2019, 2:41 PM

## 2019-09-28 NOTE — Progress Notes (Signed)
Subjective: Has been able to tolerate clear liquid diet. Has been having loose stools. Complains of pain over right chest, after chest tube removal.  Objective: Vital signs in last 24 hours: Temp:  [98 F (36.7 C)-99 F (37.2 C)] 98 F (36.7 C) (12/18 1116) Pulse Rate:  [89-112] 112 (12/18 1116) Resp:  [16-19] 19 (12/18 1116) BP: (88-107)/(56-89) 100/78 (12/18 1116) SpO2:  [96 %-99 %] 96 % (12/18 1116) Weight:  [66.1 kg] 66.1 kg (12/18 0308) Weight change: -1.8 kg Last BM Date: 09/27/19  PE: Sitting up on bedside chair, appears comfortable GENERAL: Mild pallor, no signs of dehydration ABDOMEN: Soft, nondistended, nontender EXTREMITIES: No deformity  Lab Results: Results for orders placed or performed during the hospital encounter of 09/03/19 (from the past 48 hour(s))  Glucose, capillary     Status: None   Collection Time: 09/26/19  4:55 PM  Result Value Ref Range   Glucose-Capillary 93 70 - 99 mg/dL   Comment 1 Notify RN    Comment 2 Document in Chart   Glucose, capillary     Status: Abnormal   Collection Time: 09/26/19  7:56 PM  Result Value Ref Range   Glucose-Capillary 107 (H) 70 - 99 mg/dL  Glucose, capillary     Status: Abnormal   Collection Time: 09/27/19 12:04 AM  Result Value Ref Range   Glucose-Capillary 103 (H) 70 - 99 mg/dL   Comment 1 Notify RN    Comment 2 Document in Chart   Glucose, capillary     Status: None   Collection Time: 09/27/19  4:10 AM  Result Value Ref Range   Glucose-Capillary 80 70 - 99 mg/dL   Comment 1 Notify RN    Comment 2 Document in Chart   Glucose, capillary     Status: None   Collection Time: 09/27/19 11:33 AM  Result Value Ref Range   Glucose-Capillary 98 70 - 99 mg/dL   Comment 1 Notify RN    Comment 2 Document in Chart   Glucose, capillary     Status: Abnormal   Collection Time: 09/27/19  4:51 PM  Result Value Ref Range   Glucose-Capillary 113 (H) 70 - 99 mg/dL   Comment 1 Notify RN    Comment 2 Document in Chart    Glucose, capillary     Status: Abnormal   Collection Time: 09/27/19  7:50 PM  Result Value Ref Range   Glucose-Capillary 148 (H) 70 - 99 mg/dL  Glucose, capillary     Status: Abnormal   Collection Time: 09/27/19 11:42 PM  Result Value Ref Range   Glucose-Capillary 124 (H) 70 - 99 mg/dL   Comment 1 Notify RN    Comment 2 Document in Chart   CBC     Status: Abnormal   Collection Time: 09/28/19  3:12 AM  Result Value Ref Range   WBC 8.6 4.0 - 10.5 K/uL   RBC 3.29 (L) 4.22 - 5.81 MIL/uL   Hemoglobin 8.4 (L) 13.0 - 17.0 g/dL   HCT 28.2 (L) 39.0 - 52.0 %   MCV 85.7 80.0 - 100.0 fL   MCH 25.5 (L) 26.0 - 34.0 pg   MCHC 29.8 (L) 30.0 - 36.0 g/dL   RDW 16.9 (H) 11.5 - 15.5 %   Platelets 400 150 - 400 K/uL   nRBC 0.0 0.0 - 0.2 %    Comment: Performed at [facility], [address] [place], [place] [address]  Basic metabolic panel     Status: Abnormal  Collection Time: 09/28/19  3:12 AM  Result Value Ref Range   Sodium 134 (L) 135 - 145 mmol/L   Potassium 4.0 3.5 - 5.1 mmol/L   Chloride 100 98 - 111 mmol/L   CO2 26 22 - 32 mmol/L   Glucose, Bld 124 (H) 70 - 99 mg/dL   BUN 13 8 - 23 mg/dL   Creatinine, Ser 0.59 (L) 0.61 - 1.24 mg/dL   Calcium 8.6 (L) 8.9 - 10.3 mg/dL   GFR calc non Af Amer >60 >60 mL/min   GFR calc Af Amer >60 >60 mL/min   Anion gap 8 5 - 15    Comment: Performed at [facility] [address] [place], [place] [address]  Glucose, capillary     Status: Abnormal   Collection Time: 09/28/19  8:37 AM  Result Value Ref Range   Glucose-Capillary 111 (H) 70 - 99 mg/dL   Comment 1 Notify RN    Comment 2 Document in Chart     Studies/Results: DG Chest 2 View  Result Date: 09/28/2019 CLINICAL DATA:  Post chest tube removal EXAM: CHEST - 2 VIEW COMPARISON:  09/23/2019 FINDINGS: Right chest tubes have been removed. Right PICC line remains unchanged. No pneumothorax. Similar right chest wall and neck subcutaneous emphysema. Mild elevation of the right  hemidiaphragm with right basilar atelectasis. No pleural effusion. Stable cardiomediastinal contours. IMPRESSION: Post chest tube removal.  No pneumothorax. Right basilar atelectasis with mild elevation of the right hemidiaphragm Electronically Signed   By: Macy Mis M.D.   On: 09/28/2019 08:07   DG ESOPHAGUS DILATION  Result Date: 09/26/2019 ESOPHAGEAL DILATATION: Fluoroscopy was provided for use by the requesting physician.  No images were obtained for radiographic interpretation.   Medications: I have reviewed the patient's current medications.  Assessment: Esophageal perforation, status post thoracotomy and repair Esophageal stricture, status post balloon dilation 6/7/8/9 and subsequently 10 mm balloons used Continued on PEG feeding  Plan: Repeat EGD early next week, Monday versus Tuesday for hopefully further balloon dilation with larger balloons. Discussed the same with the patient. We will follow up with him on Sunday. Meanwhile continue clear liquid diet and tube feedings.  [name], MD 09/28/2019, 12:42 PM
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
## 2019-09-28 NOTE — Progress Notes (Signed)
Occupational Therapy Treatment Patient Details Name: George Wade. MRN: [id number] DOB: [birth year] Today's Date: 09/28/2019    History of present illness Pt is a 63 y.o. male admitted 09/03/19 with c/o R-side chest pain. CT showed R hydropneumothorax and extensive distal pneumomediastinum. Pt s/p R major thoracotomy for esophageal performation with intercostal muscle flap on 11/24. PMH includes alcohol abuse, non-Hodgkin's lymphoma, tonsillar cancer, HTN, PE, esophageal stricture. Of note, recent admission 07/2019-08/2019 for chest pain and alcohol-induced mood disorder.   OT comments  Pt immediately agreeable to working with OT. Donned socks with set up, front opening gown with supervision. Performed pericare in standing with supervision and set up and washed his hands at the sink. Pt unaware of bowel incontinence in bed. Mild unsteadiness in standing with one minor LOB, but recovered without assist.   Follow Up Recommendations  SNF;Supervision/Assistance - 24 hour    Equipment Recommendations  Other (comment)(defer to next venue)    Recommendations for Other Services      Precautions / Restrictions Precautions Precautions: Fall Precaution Comments: PEG       Mobility Bed Mobility Overal bed mobility: Modified Independent             General bed mobility comments: HOB up, assisted for lines only  Transfers       Sit to Stand: Supervision         General transfer comment: supervised in standing during pericare    Balance Overall balance assessment: Needs assistance   Sitting balance-Leahy Scale: Good       Standing balance-Leahy Scale: Fair Standing balance comment: one mild LOB in standing, but self corrected                           ADL either performed or assessed with clinical judgement   ADL Overall ADL's : Needs assistance/impaired     Grooming: Wash/dry hands;Standing;Supervision/safety           Upper Body Dressing : Set  up;Supervision/safety;Standing Upper Body Dressing Details (indicate cue type and reason): front opening gown Lower Body Dressing: Set up;Sitting/lateral leans Lower Body Dressing Details (indicate cue type and reason): for socks     Toileting- Clothing Manipulation and Hygiene: Supervision/safety;Sit to/from stand Toileting - Clothing Manipulation Details (indicate cue type and reason): pt with incontinence of bowel     Functional mobility during ADLs: Supervision/safety;Rolling walker       Vision       Perception     Praxis      Cognition Arousal/Alertness: Awake/alert Behavior During Therapy: Anxious Overall Cognitive Status: Impaired/Different from baseline Area of Impairment: Memory;Safety/judgement;Problem solving                     Memory: Decreased short-term memory   Safety/Judgement: Decreased awareness of safety;Decreased awareness of deficits   Problem Solving: Slow processing;Difficulty sequencing General Comments: pt concerned that he is not a good patient, assured him we are happy to be caring for him        Exercises     Shoulder Instructions       General Comments      Pertinent Vitals/ Pain       Pain Assessment: Faces Faces Pain Scale: No hurt  Home Living  Prior Functioning/Environment              Frequency  Min 2X/week        Progress Toward Goals  OT Goals(current goals can now be found in the care plan section)  Progress towards OT goals: Progressing toward goals  Acute Rehab OT Goals Patient Stated Goal: to go to rehab and get stronger OT Goal Formulation: With patient Time For Goal Achievement: 10/12/19 Potential to Achieve Goals: Good  Plan Discharge plan remains appropriate    Co-evaluation                 AM-PAC OT "6 Clicks" Daily Activity     Outcome Measure   Help from another person eating meals?: Total Help from another  person taking care of personal grooming?: A Little Help from another person toileting, which includes using toliet, bedpan, or urinal?: A Little Help from another person bathing (including washing, rinsing, drying)?: A Little Help from another person to put on and taking off regular upper body clothing?: A Little Help from another person to put on and taking off regular lower body clothing?: A Little 6 Click Score: 16    End of Session    OT Visit Diagnosis: Unsteadiness on feet (R26.81);Other abnormalities of gait and mobility (R26.89);Muscle weakness (generalized) (M62.81);Pain Pain - Right/Left: Right   Activity Tolerance Patient tolerated treatment well   Patient Left (walking with PT)   Nurse Communication          Time: 7703-4035 OT Time Calculation (min): 24 min  Charges: OT General Charges $OT Visit: 1 Visit OT Treatments $Self Care/Home Management : 23-37 mins  Nestor Lewandowsky, OTR/L Acute Rehabilitation Services Pager: [phone] Office: [phone]   Malka So 09/28/2019, 11:05 AM

## 2019-09-28 NOTE — Progress Notes (Addendum)
2 Days Post-Op Procedure(s) (LRB): ESOPHAGOGASTRODUODENOSCOPY (EGD) WITH PROPOFOL (N/A) BALLOON DILATION (N/A) Subjective: Sitting up in bed, says he feels much better after having the chest tubes removed.  Tolerating clear liquids po without nausea.   Objective: Vital signs in last 24 hours: Temp:  [98 F (36.7 C)-99 F (37.2 C)] 98.4 F (36.9 C) (12/18 0752) Pulse Rate:  [89-95] 95 (12/18 0752) Cardiac Rhythm: Normal sinus rhythm (12/18 0733) Resp:  [16-18] 18 (12/18 0752) BP: (88-111)/(56-89) 107/89 (12/18 0752) SpO2:  [92 %-99 %] 99 % (12/18 0752) Weight:  [66.1 kg] 66.1 kg (12/18 0308)    Intake/Output from previous day: 12/17 0701 - 12/18 0700 In: 4180 [P.O.:360; NG/GT:3480] Out: 850 [Urine:850] Intake/Output this shift: No intake/output data recorded.  Physical Exam: General appearance:alert, cooperative and mild distress Heart:regular rate and rhythm Lungs:breath sounds are clear. Abdomen:soft, non-tender;G-J tube secure and TF is again infusing at 58ml/hr. The insertion site is clean and dry.  Extremities:extremities normal, no edema  Lab Results: Recent Labs    09/26/19 0400 09/28/19 0312  WBC 8.2 8.6  HGB 8.5* 8.4*  HCT 28.1* 28.2*  PLT 422* 400   BMET:  Recent Labs    09/26/19 0400 09/28/19 0312  NA 138 134*  K 4.0 4.0  CL 104 100  CO2 26 26  GLUCOSE 83 124*  BUN 15 13  CREATININE 0.61 0.59*  CALCIUM 9.0 8.6*    PT/INR: No results for input(s): LABPROT, INR in the last 72 hours. ABG    Component Value Date/Time   PHART 7.432 09/14/2019 1405   HCO3 24.1 09/14/2019 1405   TCO2 28 09/04/2019 1241   ACIDBASEDEF 6.0 (H) 09/04/2019 0522   O2SAT 95.4 09/14/2019 1405   CBG (last 3)  Recent Labs    09/27/19 1651 09/27/19 1950 09/27/19 2342  GLUCAP 113* 148* 124*    Assessment/Plan: S/P Procedure(s) (LRB): ESOPHAGOGASTRODUODENOSCOPY (EGD) WITH PROPOFOL (N/A) BALLOON DILATION (N/A)  -POD-24right thoracotomy, repair of  esophogeal perforation with intercostal muscle flap. Afebrile and WBC remainsnormal. Chest tubes removed 12/17. CXR stable. Encouraging more participation with PT since tubes are out. Anticipate discharge to SNF next week after dilation procedure.   -POD-2 EGD with dilation of distal esophageal stricture to 32mm . Post-dilation esophogram showed no evidence of leak. Tolerating PO clear liquids. GI considering repeat esophageal balloon dilation next week.   PICC placed 12//9, site looks good.  -Nutrition- he has been toleratingTF at goal rateof 77ml hrwith no sign of obstruction.Will defer diet advancement to GI service.   -EtOH abuse-on detox protocol / Precedex early post-op.Now calm and cooperative.Continue Seroquel  -Mild anemia- Hct.is stable.Monitor.  -Hypokalemia-Resolved.Monitor  -H/O PE prior to surgery. On full dose Lovenox 65mg  [place] q12H.    LOS: 25 days    Malon Kindle [phone] 09/28/2019 Patient seen and examined, agree with above Tolerating clears CXR OK with tubes out  [name], MD [facility] [phone]

## 2019-09-29 LAB — GLUCOSE, CAPILLARY
Glucose-Capillary: 105 mg/dL — ABNORMAL HIGH (ref 70–99)
Glucose-Capillary: 105 mg/dL — ABNORMAL HIGH (ref 70–99)
Glucose-Capillary: 109 mg/dL — ABNORMAL HIGH (ref 70–99)
Glucose-Capillary: 113 mg/dL — ABNORMAL HIGH (ref 70–99)
Glucose-Capillary: 120 mg/dL — ABNORMAL HIGH (ref 70–99)
Glucose-Capillary: 128 mg/dL — ABNORMAL HIGH (ref 70–99)
Glucose-Capillary: 179 mg/dL — ABNORMAL HIGH (ref 70–99)

## 2019-09-29 MED ORDER — PANTOPRAZOLE SODIUM 40 MG IV SOLR: 40.0000 mg | Freq: Two times a day (BID) | INTRAVENOUS | Status: DC

## 2019-09-29 MED ORDER — THIAMINE HCL 100 MG PO TABS: 100.0000 mg | ORAL_TABLET | Freq: Every day | ORAL | Status: DC

## 2019-09-29 MED ORDER — OSMOLITE 1.5 CAL PO LIQD
1000.0000 mL | ORAL | Status: DC
  Administered 2019-09-29 – 2019-10-02 (×4): 1000 mL
  Filled 2019-09-29 (×5): qty 1000

## 2019-09-29 NOTE — Progress Notes (Signed)
Subjective: Able to tolerate clear liquid diet without nausea, vomiting or abdominal pain.  He continues to have multiple loose stools which is likely related to tube feedings.  He continues to complain of pain on the right back chest at the site of chest tube removal.  Objective: Vital signs in last 24 hours: Temp:  [97.6 F (36.4 C)-98.5 F (36.9 C)] 98 F (36.7 C) (12/19 1100) Pulse Rate:  [90-105] 105 (12/19 1100) Resp:  [12-27] 12 (12/19 1100) BP: (104-126)/(75-93) 126/93 (12/19 1100) SpO2:  [95 %-100 %] 99 % (12/19 1100) Weight:  [68.2 kg] 68.2 kg (12/19 0343) Weight change: 2.1 kg Last BM Date: 09/28/19  PE: Sitting up on bed, frail GENERAL: Mild pallor, no icterus ABDOMEN: PEG tube in place, soft, nondistended, nontender EXTREMITIES: No deformity  Lab Results: Results for orders placed or performed during the hospital encounter of 09/03/19 (from the past 48 hour(s))  Glucose, capillary     Status: Abnormal   Collection Time: 09/27/19  4:51 PM  Result Value Ref Range   Glucose-Capillary 113 (H) 70 - 99 mg/dL   Comment 1 Notify RN    Comment 2 Document in Chart   Glucose, capillary     Status: Abnormal   Collection Time: 09/27/19  7:50 PM  Result Value Ref Range   Glucose-Capillary 148 (H) 70 - 99 mg/dL  Glucose, capillary     Status: Abnormal   Collection Time: 09/27/19 11:42 PM  Result Value Ref Range   Glucose-Capillary 124 (H) 70 - 99 mg/dL   Comment 1 Notify RN    Comment 2 Document in Chart   CBC     Status: Abnormal   Collection Time: 09/28/19  3:12 AM  Result Value Ref Range   WBC 8.6 4.0 - 10.5 K/uL   RBC 3.29 (L) 4.22 - 5.81 MIL/uL   Hemoglobin 8.4 (L) 13.0 - 17.0 g/dL   HCT 28.2 (L) 39.0 - 52.0 %   MCV 85.7 80.0 - 100.0 fL   MCH 25.5 (L) 26.0 - 34.0 pg   MCHC 29.8 (L) 30.0 - 36.0 g/dL   RDW 16.9 (H) 11.5 - 15.5 %   Platelets 400 150 - 400 K/uL   nRBC 0.0 0.0 - 0.2 %    Comment: Performed at [facility], [address], [place], [place]  [address]  Basic metabolic panel     Status: Abnormal   Collection Time: 09/28/19  3:12 AM  Result Value Ref Range   Sodium 134 (L) 135 - 145 mmol/L   Potassium 4.0 3.5 - 5.1 mmol/L   Chloride 100 98 - 111 mmol/L   CO2 26 22 - 32 mmol/L   Glucose, Bld 124 (H) 70 - 99 mg/dL   BUN 13 8 - 23 mg/dL   Creatinine, Ser 0.59 (L) 0.61 - 1.24 mg/dL   Calcium 8.6 (L) 8.9 - 10.3 mg/dL   GFR calc non Af Amer >60 >60 mL/min   GFR calc Af Amer >60 >60 mL/min   Anion gap 8 5 - 15    Comment: Performed at [facility] [address] [place], [place] [address]  Glucose, capillary     Status: Abnormal   Collection Time: 09/28/19  8:37 AM  Result Value Ref Range   Glucose-Capillary 111 (H) 70 - 99 mg/dL   Comment 1 Notify RN    Comment 2 Document in Chart   Glucose, capillary     Status: Abnormal   Collection Time: 09/28/19  4:07 PM  Result Value Ref Range   Glucose-Capillary 118 (H) 70 - 99 mg/dL   Comment 1 Notify RN    Comment 2 Document in Chart   Glucose, capillary     Status: Abnormal   Collection Time: 09/28/19  8:04 PM  Result Value Ref Range   Glucose-Capillary 106 (H) 70 - 99 mg/dL   Comment 1 Notify RN    Comment 2 Document in Chart   Glucose, capillary     Status: Abnormal   Collection Time: 09/29/19  1:00 AM  Result Value Ref Range   Glucose-Capillary 120 (H) 70 - 99 mg/dL   Comment 1 Notify RN    Comment 2 Document in Chart   Glucose, capillary     Status: Abnormal   Collection Time: 09/29/19  3:45 AM  Result Value Ref Range   Glucose-Capillary 128 (H) 70 - 99 mg/dL   Comment 1 Notify RN    Comment 2 Document in Chart   Glucose, capillary     Status: Abnormal   Collection Time: 09/29/19  8:56 AM  Result Value Ref Range   Glucose-Capillary 105 (H) 70 - 99 mg/dL   Comment 1 Notify RN    Comment 2 Document in Chart   Glucose, capillary     Status: Abnormal   Collection Time: 09/29/19 11:38 AM  Result Value Ref Range   Glucose-Capillary 179 (H) 70 - 99 mg/dL    Comment 1 Notify RN    Comment 2 Document in Chart     Studies/Results: DG Chest 2 View  Result Date: 09/28/2019 CLINICAL DATA:  Post chest tube removal EXAM: CHEST - 2 VIEW COMPARISON:  09/23/2019 FINDINGS: Right chest tubes have been removed. Right PICC line remains unchanged. No pneumothorax. Similar right chest wall and neck subcutaneous emphysema. Mild elevation of the right hemidiaphragm with right basilar atelectasis. No pleural effusion. Stable cardiomediastinal contours. IMPRESSION: Post chest tube removal.  No pneumothorax. Right basilar atelectasis with mild elevation of the right hemidiaphragm Electronically Signed   By: Macy Mis M.D.   On: 09/28/2019 08:07    Medications: I have reviewed the patient's current medications.  Assessment: Esophageal perforation, status post thoracotomy and repair. Esophageal stricture status post balloon dilation under fluoroscopy and wire guidance by Dr. Watt Climes  Plan: Repeat EGD for dilation on Monday at 10 AM with Dr. Watt Climes. Discussed about the risks, benefits and alternatives with the patient in details, he understands and verbalizes consent. Orders have been placed to keep him n.p.o. post midnight on Sunday, hold Lovenox after midnight on Sunday, hold tube feedings after midnight on Sunday.  [name], MD 09/29/2019, 12:51 PM

## 2019-09-29 NOTE — Plan of Care (Signed)
Patient is progressing as expected. Plan for another esophagus dilation on Monday.

## 2019-09-29 NOTE — Progress Notes (Signed)
Responded to page from Mr. Laneve's nurse to speak with a chaplain. Patient wanted prayer and to discuss the situations in his life. Will continue to be available for spiritual care as he needs.  Rev. Kemmerer.

## 2019-09-29 NOTE — Progress Notes (Addendum)
      [address]       [place] [address]             [phone]      3 Days Post-Op Procedure(s) (LRB): ESOPHAGOGASTRODUODENOSCOPY (EGD) WITH PROPOFOL (N/A) BALLOON DILATION (N/A)   Subjective:  Patient doing so/so.  He is having diarrhea, but states overall he hasn't eaten much.  He also states he is experiencing pain at his rectum and feels like his intestines have come out.  He states this is making it difficult to keep the area clean.  He has had vomiting previously when he ate jello, however this has resolved.  Objective: Vital signs in last 24 hours: Temp:  [97.6 F (36.4 C)-98.5 F (36.9 C)] 98 F (36.7 C) (12/19 0749) Pulse Rate:  [90-112] 92 (12/19 0749) Cardiac Rhythm: Normal sinus rhythm (12/19 0701) Resp:  [14-27] 14 (12/19 0749) BP: (100-121)/(75-92) 121/92 (12/19 0749) SpO2:  [95 %-100 %] 98 % (12/19 0749) Weight:  [68.2 kg] 68.2 kg (12/19 0343)  Intake/Output from previous day: 12/18 0701 - 12/19 0700 In: 1680 [P.O.:720; NG/GT:720] Out: 2375 [Urine:2375] Intake/Output this shift: Total I/O In: -  Out: 1350 [Urine:1350]  General appearance: alert, cooperative and no distress Heart: regular rate and rhythm Lungs: clear to auscultation bilaterally Abdomen: soft, non-tender; bowel sounds normal; no masses,  no organomegaly GU- large collection of anal condyloma present Wound: well healed  Lab Results: Recent Labs    09/28/19 0312  WBC 8.6  HGB 8.4*  HCT 28.2*  PLT 400   BMET:  Recent Labs    09/28/19 0312  NA 134*  K 4.0  CL 100  CO2 26  GLUCOSE 124*  BUN 13  CREATININE 0.59*  CALCIUM 8.6*    PT/INR: No results for input(s): LABPROT, INR in the last 72 hours. ABG    Component Value Date/Time   PHART 7.432 09/14/2019 1405   HCO3 24.1 09/14/2019 1405   TCO2 28 09/04/2019 1241   ACIDBASEDEF 6.0 (H) 09/04/2019 0522   O2SAT 95.4 09/14/2019 1405   CBG (last 3)  Recent Labs    09/29/19 0100 09/29/19 0345  09/29/19 0856  GLUCAP 120* 128* 105*    Assessment/Plan: S/P Procedure(s) (LRB): ESOPHAGOGASTRODUODENOSCOPY (EGD) WITH PROPOFOL (N/A) BALLOON DILATION (N/A)  1. CV- hemodynamically stable 2. Pulm- no acute issues, off oxygen, CTs are out, continue IS 3. GI- esophageal stricture s/p dilatation, GI is following possible repeat EGD/dilatation early next week, tolerating liquid diet, tube feeds 4. 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 patient wishes for this to be addressed.  I contacted general surgery who states this would not be dealt with while in the hospital and he would need to follow up with one of the colorectal surgeons at discharge,  5. Lovenox for H/O DVT 6. Dispo- patient stable, continue current care, will arrange follow up appointment at General surgery office, appreciate GI assistance   LOS: 26 days    Erin Barrett, PA-C 09/29/2019  esophageal dilatation planned Monday by GI

## 2019-09-29 NOTE — Plan of Care (Signed)

## 2019-09-30 LAB — GLUCOSE, CAPILLARY
Glucose-Capillary: 119 mg/dL — ABNORMAL HIGH (ref 70–99)
Glucose-Capillary: 116 mg/dL — ABNORMAL HIGH (ref 70–99)
Glucose-Capillary: 103 mg/dL — ABNORMAL HIGH (ref 70–99)
Glucose-Capillary: 124 mg/dL — ABNORMAL HIGH (ref 70–99)

## 2019-09-30 MED ORDER — SODIUM CHLORIDE 0.9 % IV SOLN: INTRAVENOUS | Status: DC

## 2019-09-30 NOTE — Progress Notes (Signed)
Consent has been taken for a procedure tomorrow, pt is medicating with pain medicine around the clock. Pt is aware about NPO status from midnight and will be on NPO. Per MD pain medicine can still be given via G-tube, Feeding tube can be turned off since midnight and no Lovenox after midnight if it is timed.   Pt sat in a recliner pretty much for a goof time, tolerated very well, will continue to monitor  Palma Holter, RN

## 2019-09-30 NOTE — Progress Notes (Signed)
      [address]       [place] [address]             [phone]      4 Days Post-Op Procedure(s) (LRB): ESOPHAGOGASTRODUODENOSCOPY (EGD) WITH PROPOFOL (N/A) BALLOON DILATION (N/A)   Subjective:  Patient sitting up in bed.  States he is swallowing so much better.  He asks that I ensure he can still get pain medications after midnight as he doesn't want to have to argue with staff.  Objective: Vital signs in last 24 hours: Temp:  [98 F (36.7 C)-98.3 F (36.8 C)] 98.2 F (36.8 C) (12/20 0742) Pulse Rate:  [92-105] 97 (12/20 0742) Cardiac Rhythm: Sinus tachycardia (12/20 0700) Resp:  [12-19] 18 (12/20 0742) BP: (105-126)/(78-93) 105/80 (12/20 0742) SpO2:  [98 %-99 %] 98 % (12/20 0742) Weight:  [65.6 kg] 65.6 kg (12/20 0500)  Intake/Output from previous day: 12/19 0701 - 12/20 0700 In: 360 [P.O.:360] Out: 3900 [Urine:3900] Intake/Output this shift: Total I/O In: -  Out: 200 [Urine:200]  General appearance: alert, cooperative and no distress Heart: regular rate and rhythm Lungs: clear to auscultation bilaterally Abdomen: soft, non-tender; bowel sounds normal; no masses,  no organomegaly Extremities: extremities normal, atraumatic, no cyanosis or edema Wound: well healed  Lab Results: Recent Labs    09/28/19 0312  WBC 8.6  HGB 8.4*  HCT 28.2*  PLT 400   BMET:  Recent Labs    09/28/19 0312  NA 134*  K 4.0  CL 100  CO2 26  GLUCOSE 124*  BUN 13  CREATININE 0.59*  CALCIUM 8.6*    PT/INR: No results for input(s): LABPROT, INR in the last 72 hours. ABG    Component Value Date/Time   PHART 7.432 09/14/2019 1405   HCO3 24.1 09/14/2019 1405   TCO2 28 09/04/2019 1241   ACIDBASEDEF 6.0 (H) 09/04/2019 0522   O2SAT 95.4 09/14/2019 1405   CBG (last 3)  Recent Labs    09/29/19 2018 09/29/19 2339 09/30/19 0732  GLUCAP 109* 105* 119*    Assessment/Plan: S/P Procedure(s) (LRB): ESOPHAGOGASTRODUODENOSCOPY (EGD) WITH PROPOFOL  (N/A) BALLOON DILATION (N/A)  1. CV- hemodynamically stable 2. Pulm- no acute issues, continue IS 3. GI- esophageal stricture, planning for repeat dilatation tomorrow, continue current diet and tube feeds for now 4. Lovenox for DVT prophylaxis 5. Dispo- patient stable, will be NPO at midnight, plan for repeat EGD with dilatation in AM per GI   LOS: 27 days   Ellwood Handler, PA-C  09/30/2019

## 2019-09-30 NOTE — Plan of Care (Signed)

## 2019-10-01 ENCOUNTER — Inpatient Hospital Stay (HOSPITAL_COMMUNITY): Payer: Self-pay

## 2019-10-01 ENCOUNTER — Inpatient Hospital Stay (HOSPITAL_COMMUNITY): Payer: Self-pay | Admitting: Certified Registered Nurse Anesthetist

## 2019-10-01 ENCOUNTER — Encounter (HOSPITAL_COMMUNITY): Payer: Self-pay | Admitting: Thoracic Surgery (Cardiothoracic Vascular Surgery)

## 2019-10-01 ENCOUNTER — Encounter (HOSPITAL_COMMUNITY)
Disposition: A | Discharge status: Skilled Nursing Facility | Payer: Self-pay | Source: Other Acute Inpatient Hospital | Attending: Thoracic Surgery (Cardiothoracic Vascular Surgery)

## 2019-10-01 HISTORY — PX: BALLOON DILATION: SHX5330

## 2019-10-01 HISTORY — PX: ESOPHAGOGASTRODUODENOSCOPY (EGD) WITH PROPOFOL: SHX5813

## 2019-10-01 LAB — GLUCOSE, CAPILLARY
Glucose-Capillary: 103 mg/dL — ABNORMAL HIGH (ref 70–99)
Glucose-Capillary: 118 mg/dL — ABNORMAL HIGH (ref 70–99)
Glucose-Capillary: 131 mg/dL — ABNORMAL HIGH (ref 70–99)
Glucose-Capillary: 140 mg/dL — ABNORMAL HIGH (ref 70–99)
Glucose-Capillary: 86 mg/dL (ref 70–99)
Glucose-Capillary: 95 mg/dL (ref 70–99)

## 2019-10-01 SURGERY — ESOPHAGOGASTRODUODENOSCOPY (EGD) WITH PROPOFOL
Anesthesia: Monitor Anesthesia Care

## 2019-10-01 MED ORDER — PROPOFOL 10 MG/ML IV BOLUS
INTRAVENOUS | Status: DC | PRN
  Administered 2019-10-01: 30 mg via INTRAVENOUS
  Administered 2019-10-01 (×2): 20 mg via INTRAVENOUS

## 2019-10-01 MED ORDER — LACTATED RINGERS IV SOLN: INTRAVENOUS | Status: DC | PRN

## 2019-10-01 MED ORDER — PROPOFOL 500 MG/50ML IV EMUL
INTRAVENOUS | Status: DC | PRN
  Administered 2019-10-01: 100 ug/kg/min via INTRAVENOUS

## 2019-10-01 MED ORDER — LACTATED RINGERS IV SOLN: INTRAVENOUS | Status: DC

## 2019-10-01 MED ORDER — LIDOCAINE 2% (20 MG/ML) 5 ML SYRINGE
INTRAMUSCULAR | Status: DC | PRN
  Administered 2019-10-01: 100 mg via INTRAVENOUS

## 2019-10-01 SURGICAL SUPPLY — 14 items

## 2019-10-01 NOTE — Anesthesia Preprocedure Evaluation (Addendum)
Anesthesia Evaluation  Patient identified by MRN, date of birth, ID band Patient awake    Reviewed: Allergy & Precautions, NPO status , Patient's Chart, lab work & pertinent test results  History of Anesthesia Complications Negative for: history of anesthetic complications  Airway Mallampati: I  TM Distance: >3 FB Neck ROM: Full    Dental  (+) Dental Advisory Given, Teeth Intact   Pulmonary   S/p right thoracotomy for esophageal perforation, s/p chest tube removal    Pulmonary exam normal        Cardiovascular hypertension, Normal cardiovascular exam     Neuro/Psych negative neurological ROS  negative psych ROS   GI/Hepatic (+)     substance abuse  alcohol use,  PEG in place Esophageal stricture Rectal mass with bleeding Esophageal perforation s/p repair    Endo/Other  negative endocrine ROS  Renal/GU negative Renal ROS     Musculoskeletal negative musculoskeletal ROS (+)   Abdominal   Peds  Hematology negative hematology ROS (+)   Anesthesia Other Findings   Reproductive/Obstetrics                            Anesthesia Physical Anesthesia Plan  ASA: III  Anesthesia Plan: MAC   Post-op Pain Management:    Induction: Intravenous  PONV Risk Score and Plan: 1 and Propofol infusion and Treatment may vary due to age or medical condition  Airway Management Planned: Nasal Cannula and Natural Airway  Additional Equipment: None  Intra-op Plan:   Post-operative Plan:   Informed Consent: I have reviewed the patients History and Physical, chart, labs and discussed the procedure including the risks, benefits and alternatives for the proposed anesthesia with the patient or authorized representative who has indicated his/her understanding and acceptance.       Plan Discussed with: CRNA and Anesthesiologist  Anesthesia Plan Comments:        Anesthesia Quick  Evaluation

## 2019-10-01 NOTE — TOC Progression Note (Signed)
Transition of Care (TOC) - Progression Note    Patient Details  Name: George Wade. MRN: [id number] Date of Birth: [birth year]  Transition of Care [facility]) CM/SW [place], [place] Phone Number: 10/01/2019, 9:27 AM  Clinical Narrative:     CSW called [facility] and left a message for Anselmo Rod. CSW is awaiting a return phone call.   CSW will continue to follow and assist with disposition planning.   Expected Discharge Plan: [place] Barriers to Discharge: Inadequate or no insurance, Continued Medical Work up  Expected Discharge Plan and Services Expected Discharge Plan: [place] arrangements for the past 2 months: Hotel/Motel                                       Social Determinants of Health (SDOH) Interventions    Readmission Risk Interventions No flowsheet data found.

## 2019-10-01 NOTE — Anesthesia Procedure Notes (Signed)
Procedure Name: MAC Date/Time: 10/01/2019 10:05 AM Performed by: Janene Harvey, CRNA Pre-anesthesia Checklist: Patient identified, Emergency Drugs available, Suction available and Patient being monitored Patient Re-evaluated:Patient Re-evaluated prior to induction Oxygen Delivery Method: Nasal cannula Dental Injury: Teeth and Oropharynx as per pre-operative assessment

## 2019-10-01 NOTE — Transfer of Care (Signed)
Immediate Anesthesia Transfer of Care Note  Patient: George Wade.  Procedure(s) Performed: ESOPHAGOGASTRODUODENOSCOPY (EGD) WITH PROPOFOL (N/A ) BALLOON DILATION (N/A )  Patient Location: Endoscopy Unit  Anesthesia Type:MAC  Level of Consciousness: drowsy  Airway & Oxygen Therapy: Patient Spontanous Breathing and Patient connected to nasal cannula oxygen  Post-op Assessment: Report given to RN and Post -op Vital signs reviewed and stable  Post vital signs: Reviewed  Last Vitals:  Vitals Value Taken Time  BP 113/76 10/01/19 1037  Temp    Pulse 84 10/01/19 1038  Resp 19 10/01/19 1038  SpO2 100 % 10/01/19 1038  Vitals shown include unvalidated device data.  Last Pain:  Vitals:   10/01/19 1037  TempSrc:   PainSc: 0-No pain      Patients Stated Pain Goal: 0 (07/31/10 7356)  Complications: No apparent anesthesia complications

## 2019-10-01 NOTE — Progress Notes (Signed)
Patient is laden down with many things.  He has been carrying a heavy load and judgment for himself.  We spoke about new chapters in the future and letting go of baggage he has been carrying around that needs to go so he can have room to have joy and hope and peace and love.  Nothing is impossible with God-Considering the Christmas story and how the angel spoke to [facility] and chose her -Likewise God could be calling him and has purpose and new chapters ahead once he can get through this tough time.  His emotional health will have great impact on his physical healing and having a move positive view of himself will help considerably.  Patient was amazing to be able to talk and express himself so sell after having surgery this morning-.  Hopefully he has some peace and can rest better than he has lately. Had prayer with patient . Conard Novak, Chaplain   10/01/19 1800  Clinical Encounter Type  Visited With Patient  Visit Type Initial;Psychological support;Spiritual support  Referral From Chaplain  Consult/Referral To Chaplain  Spiritual Encounters  Spiritual Needs Prayer;Emotional  Stress Factors  Patient Stress Factors Exhausted;Family relationships;Financial concerns;Health changes;Loss;Loss of control;Major life changes (carrying lots of stress)

## 2019-10-01 NOTE — Progress Notes (Signed)
George Wade. 9:36 AM  Subjective: Patient doing much better from his previous dilation and surgery and has no new complaints in his hospital computer chart reviewed and his case discussed with my partner Dr. Therisa Doyne  Objective: Vital signs stable afebrile exam please see preassessment evaluation yesterday's labs fine  Assessment: Tight esophageal stricture  Plan: Okay to proceed with repeat dilation with anesthesia assistance and the risks were rediscussed with the patient  [facility]  office [phone] After 5PM or if no answer call [phone]

## 2019-10-01 NOTE — Anesthesia Postprocedure Evaluation (Signed)
Anesthesia Post Note  Patient: George Wade.  Procedure(s) Performed: ESOPHAGOGASTRODUODENOSCOPY (EGD) WITH PROPOFOL (N/A ) BALLOON DILATION (N/A )     Patient location during evaluation: PACU Anesthesia Type: MAC Level of consciousness: awake and alert Pain management: pain level controlled Vital Signs Assessment: post-procedure vital signs reviewed and stable Respiratory status: spontaneous breathing, nonlabored ventilation and respiratory function stable Cardiovascular status: stable and blood pressure returned to baseline Anesthetic complications: no    Last Vitals:  Vitals:   10/01/19 1045 10/01/19 1055  BP: 116/82 117/80  Pulse: 87 82  Resp: 13 16  Temp:    SpO2: 100% 99%    Last Pain:  Vitals:   10/01/19 1055  TempSrc:   PainSc: 0-No pain                 Audry Pili

## 2019-10-01 NOTE — Progress Notes (Addendum)
Physical Therapy Treatment Patient Details Name: George Wade. MRN: [id number] DOB: 22-Oct-1955 Today's Date: 10/01/2019    History of Present Illness Pt is a 63 y.o. male admitted 09/03/19 with c/o R-side chest pain. CT showed R hydropneumothorax and extensive distal pneumomediastinum. Pt s/p R major thoracotomy for esophageal performation with intercostal muscle flap on 11/24. s/p EGD and balloon dilation 12/21.  PMH includes alcohol abuse, non-Hodgkin's lymphoma, tonsillar cancer, HTN, PE, esophageal stricture. Of note, recent admission 07/2019-08/2019 for chest pain and alcohol-induced mood disorder.    PT Comments    Patient progressing well towards PT goals. Today, pt tolerated gait training without use of DME and requires Min guard-Min A for balance. Tolerated higher level balance challenges - stepping over objects, changes in direction, turns etc with only mild deviations in gait but no overt LOB. HR up to 125 bpm during activity. Pt reports not being in a great mood today. Continues to have deficits relating to awareness, safety, memory. Encouraged walking with nursing daily. POC updated and goals continue to be appropriate. Would benefit working on higher level balance, endurance and overall mobility while in the hospital. Will follow.   Follow Up Recommendations  SNF;Supervision for mobility/OOB     Equipment Recommendations  Other (comment)(TBA)    Recommendations for Other Services       Precautions / Restrictions Precautions Precautions: Fall Precaution Comments: PEG Restrictions Weight Bearing Restrictions: No    Mobility  Bed Mobility Overal bed mobility: Modified Independent Bed Mobility: Supine to Sit;Sit to Supine     Supine to sit: Modified independent (Device/Increase time);HOB elevated Sit to supine: Modified independent (Device/Increase time);HOB elevated   General bed mobility comments: No assist needed, HOB slightly  elevated.  Transfers Overall transfer level: Needs assistance Equipment used: None Transfers: Sit to/from Stand Sit to Stand: Supervision         General transfer comment: supervision for lines and safety without AD.  Ambulation/Gait Ambulation/Gait assistance: Min guard;Min assist Gait Distance (Feet): 500 Feet Assistive device: None Gait Pattern/deviations: Step-through pattern;Drifts right/left;Narrow base of support Gait velocity: decreased  but improving    General Gait Details: Slow, guarded gait, close Min guard-Min A with balance challenges. Occasionally reaching for rail for support. HR up to 125 bpm. 2/4 DOE. See balance section for details.   Stairs             Wheelchair Mobility    Modified Rankin (Stroke Patients Only)       Balance Overall balance assessment: Needs assistance Sitting-balance support: Feet supported;No upper extremity supported Sitting balance-Leahy Scale: Good     Standing balance support: During functional activity Standing balance-Leahy Scale: Fair Standing balance comment: Min guard most of time with static and dynamic standing, does require Min A at times if balance challenged             High level balance activites: Direction changes;Turns;Sudden stops High Level Balance Comments: Tolerated above with mild deviations in gait and required Min A at times. Able to step over items in hallway with close Min guard.            Cognition Arousal/Alertness: Awake/alert Behavior During Therapy: WFL for tasks assessed/performed Overall Cognitive Status: Impaired/Different from baseline Area of Impairment: Memory;Safety/judgement;Problem solving                     Memory: Decreased short-term memory   Safety/Judgement: Decreased awareness of safety;Decreased awareness of deficits   Problem Solving: Slow processing;Difficulty sequencing General  Comments: Does not seem aware of his cognitive deficits, "do you  think I have any deficits?" "someone mentioned that to me and I feel like i am lucid." Frustrated today. "I am in the a mood."      Exercises      General Comments General comments (skin integrity, edema, etc.): VSS on RA except HR up to 125 bpm during mobility.      Pertinent Vitals/Pain Pain Assessment: No/denies pain    Home Living                      Prior Function            PT Goals (current goals can now be found in the care plan section) Acute Rehab PT Goals Patient Stated Goal: to be able to eat all foods and get stronger PT Goal Formulation: With patient Time For Goal Achievement: 10/15/19 Potential to Achieve Goals: Good Progress towards PT goals: Progressing toward goals    Frequency    Min 3X/week      PT Plan Current plan remains appropriate    Co-evaluation              AM-PAC PT "6 Clicks" Mobility   Outcome Measure  Help needed turning from your back to your side while in a flat bed without using bedrails?: None Help needed moving from lying on your back to sitting on the side of a flat bed without using bedrails?: None Help needed moving to and from a bed to a chair (including a wheelchair)?: A Little Help needed standing up from a chair using your arms (e.g., wheelchair or bedside chair)?: A Little Help needed to walk in hospital room?: A Little Help needed climbing 3-5 steps with a railing? : A Little 6 Click Score: 20    End of Session Equipment Utilized During Treatment: Gait belt Activity Tolerance: Patient tolerated treatment well Patient left: in bed;with call bell/phone within reach;with bed alarm set Nurse Communication: Mobility status PT Visit Diagnosis: Other abnormalities of gait and mobility (R26.89);Muscle weakness (generalized) (M62.81) Pain - Right/Left: Right     Time: 3606-7703 PT Time Calculation (min) (ACUTE ONLY): 26 min  Charges:  $Gait Training: 8-22 mins $Neuromuscular Re-education: 8-22  mins                     Marisa Severin, PT, DPT Acute Rehabilitation Services Pager [phone] Office [place] 10/01/2019, 3:34 PM

## 2019-10-01 NOTE — Op Note (Signed)
[facility] Patient Name: George Wade Procedure Date : 10/01/2019 MRN: [id number] Attending MD: Clarene Essex , MD Date of Birth: 20-Jan-1956 CSN: [id number] Age: 63 Admit Type: Inpatient Procedure:                Upper GI endoscopy Indications:              Stenosis of the esophagus, For therapy of                            esophageal stenosis Providers:                Clarene Essex, MD, Benay Pillow, RN, Grace Isaac,                            RN, William Dalton, Technician Referring MD:              Medicines:                Propofol total dose 200 mg IV, 100 mg IV lidocaine Complications:            No immediate complications. Estimated Blood Loss:     Estimated blood loss: none. Procedure:                Pre-Anesthesia Assessment:                           - Prior to the procedure, a History and Physical                            was performed, and patient medications and                            allergies were reviewed. The patient's tolerance of                            previous anesthesia was also reviewed. The risks                            and benefits of the procedure and the sedation                            options and risks were discussed with the patient.                            All questions were answered, and informed consent                            was obtained. Prior Anticoagulants: The patient has                            taken Lovenox (enoxaparin), last dose was 1 day                            prior to procedure. ASA Grade Assessment: III - A  patient with severe systemic disease. After                            reviewing the risks and benefits, the patient was                            deemed in satisfactory condition to undergo the                            procedure.                           After obtaining informed consent, the endoscope was                            passed under direct vision.  Throughout the                            procedure, the patient's blood pressure, pulse, and                            oxygen saturations were monitored continuously. The                            GIF-H190 ([id number]) Olympus gastroscope was                            introduced through the mouth, and advanced to the                            second part of duodenum. The upper GI endoscopy was                            accomplished without difficulty. The patient                            tolerated the procedure well. Scope In: Scope Out: Findings:      A small hiatal hernia was present.      One benign-appearing, intrinsic severe (stenosis; an endoscope cannot       pass) stenosis was found. The stenosis was traversed after dilation to       10 mm. A TTS dilator was passed through the scope. Dilation with an       05-19-09 mm balloon and a 07-22-11 mm balloon dilator was performed to 11       mm under fluoroscopic guidance. The stricture was evaluated after the 10       and 11 mm balloon which did show moderate disruption but no obvious       perforation      A PEG tube with J port was seen in the duodenum and was found in the       gastric body and duodenum.      A foreign body-feeding port was found in the duodenal bulb, in the first       portion of the duodenum and in the second portion of the duodenum.  The exam was otherwise without abnormality. Impression:               - Small hiatal hernia.                           - Benign-appearing esophageal stenosis. Dilated.                           - A gastric tube with tip in the small bowel was                            found in the stomach.                           - Duodenal foreign body i.e. feeding port.                           - The examination was otherwise normal.                           - No specimens collected. Recommendation:           - Clear liquid diet for 6 hours. If doing well may                             have soft solids                           - Continue present medications. If doing well may                            resume blood thinners tomorrow                           - Return to GI clinic PRN.                           - Telephone GI clinic if symptomatic PRN.                           - Repeat upper endoscopy in 2 -3weeks for                            retreatment or right before discharge if he stays                            over another week. Procedure Code(s):        --- Professional ---                           [phone], Esophagogastroduodenoscopy, flexible,                            transoral; with transendoscopic balloon dilation of  esophagus (less than 30 mm diameter) Diagnosis Code(s):        --- Professional ---                           K44.9, Diaphragmatic hernia without obstruction or                            gangrene                           K22.2, Esophageal obstruction                           Z93.1, Gastrostomy status                           T18.3XXS, Foreign body in small intestine, sequela CPT copyright 2019 [employer]. All rights reserved. The codes documented in this report are preliminary and upon coder review may  be revised to meet current compliance requirements. Clarene Essex, MD 10/01/2019 10:42:44 AM This report has been signed electronically. Number of Addenda: 0

## 2019-10-01 NOTE — Progress Notes (Signed)
Occupational Therapy Treatment Patient Details Name: George Wade. MRN: [id number] DOB: 1956/10/07 Today's Date: 10/01/2019    History of present illness Pt is a 63 y.o. male admitted 09/03/19 with c/o R-side chest pain. CT showed R hydropneumothorax and extensive distal pneumomediastinum. Pt s/p R major thoracotomy for esophageal performation with intercostal muscle flap on 11/24. PMH includes alcohol abuse, non-Hodgkin's lymphoma, tonsillar cancer, HTN, PE, esophageal stricture. Of note, recent admission 07/2019-08/2019 for chest pain and alcohol-induced mood disorder.   OT comments  Pt performed short distance ambulation in room with supervision for safety and lines without AD. Stood at sink for 2 grooming activities and performed pericare prior to leaving for EGD. Pt is hopeful for a good outcome so he will not be dependent on tube feedings.   Follow Up Recommendations  SNF;Supervision/Assistance - 24 hour    Equipment Recommendations       Recommendations for Other Services      Precautions / Restrictions Precautions Precautions: Fall Precaution Comments: PEG Restrictions Weight Bearing Restrictions: No       Mobility Bed Mobility Overal bed mobility: Modified Independent                Transfers Overall transfer level: Needs assistance   Transfers: Sit to/from Stand Sit to Stand: Supervision         General transfer comment: supervision for lines and safety without AD    Balance     Sitting balance-Leahy Scale: Good       Standing balance-Leahy Scale: Fair                             ADL either performed or assessed with clinical judgement   ADL Overall ADL's : Needs assistance/impaired     Grooming: Wash/dry hands;Standing;Brushing hair;Supervision/safety                       Toileting- Clothing Manipulation and Hygiene: Supervision/safety;Sit to/from stand               Vision       Perception      Praxis      Cognition Arousal/Alertness: Awake/alert Behavior During Therapy: [facility] for tasks assessed/performed Overall Cognitive Status: Impaired/Different from baseline Area of Impairment: Memory;Safety/judgement;Problem solving                     Memory: Decreased short-term memory   Safety/Judgement: Decreased awareness of safety;Decreased awareness of deficits   Problem Solving: Slow processing;Difficulty sequencing          Exercises     Shoulder Instructions       General Comments      Pertinent Vitals/ Pain       Pain Assessment: No/denies pain  Home Living                                          Prior Functioning/Environment              Frequency  Min 2X/week        Progress Toward Goals  OT Goals(current goals can now be found in the care plan section)  Progress towards OT goals: Progressing toward goals  Acute Rehab OT Goals Patient Stated Goal: to be able to eat all foods OT Goal Formulation: With patient Time For Goal Achievement:  10/12/19 Potential to Achieve Goals: Good  Plan Discharge plan remains appropriate    Co-evaluation                 AM-PAC OT "6 Clicks" Daily Activity     Outcome Measure   Help from another person eating meals?: Total Help from another person taking care of personal grooming?: A Little Help from another person toileting, which includes using toliet, bedpan, or urinal?: A Little Help from another person bathing (including washing, rinsing, drying)?: A Little Help from another person to put on and taking off regular upper body clothing?: A Little Help from another person to put on and taking off regular lower body clothing?: A Little 6 Click Score: 16    End of Session    OT Visit Diagnosis: Unsteadiness on feet (R26.81);Other abnormalities of gait and mobility (R26.89);Muscle weakness (generalized) (M62.81)   Activity Tolerance Patient tolerated treatment well    Patient Left Other (comment)(in W/C going to procedure)   Nurse Communication          Time: 8921-1941 OT Time Calculation (min): 17 min  Charges: OT General Charges $OT Visit: 1 Visit OT Treatments $Self Care/Home Management : 8-22 mins  Nestor Lewandowsky, OTR/L Acute Rehabilitation Services Pager: [phone] Office: [phone]   Malka So 10/01/2019, 11:08 AM

## 2019-10-01 NOTE — Progress Notes (Signed)
Spiritual care (chaplain) paged as requested by the patient. Palma Holter, RN

## 2019-10-01 NOTE — Progress Notes (Signed)
Pt care started at 3 pm, pt is comfortable, Vitals stable, Osmolite feeding continue at 1, pt is reminded his pain medicines timing but denies pain this time, will continue to monitor the patient  Palma Holter, RN

## 2019-10-01 NOTE — Progress Notes (Addendum)
      [address]       [place] [address]             [phone]      5 Days Post-Op Procedure(s) (LRB): ESOPHAGOGASTRODUODENOSCOPY (EGD) WITH PROPOFOL (N/A) BALLOON DILATION (N/A)   Subjective:  Patient doing alright.  States he is a little anxious about his procedure this morning.  He is requesting ice chips immediately after procedure as he is thirsty.  He also states his back is sore which is a chronic problem for him.  He states he is also concerned about his mobility stating he is wobbly.  I explained to the patient that due to his long hospital stay he will be deconditioned more than he expects.  This will improve as he continues to work with PT and ambulate as able.  He states PT did not work with him this weekend.  Objective: Vital signs in last 24 hours: Temp:  [97.6 F (36.4 C)-98.6 F (37 C)] 98.1 F (36.7 C) (12/21 0746) Pulse Rate:  [86-105] 86 (12/21 0746) Cardiac Rhythm: Normal sinus rhythm (12/21 0730) Resp:  [15-18] 18 (12/21 0746) BP: (111-120)/(80-93) 119/80 (12/21 0746) SpO2:  [98 %-100 %] 99 % (12/21 0746)  Intake/Output from previous day: 12/20 0701 - 12/21 0700 In: 68 [P.O.:170] Out: 2750 [Urine:2750]  General appearance: alert, cooperative and no distress Heart: regular rate and rhythm Lungs: clear to auscultation bilaterally Abdomen: soft, non-tender; bowel sounds normal; no masses,  no organomegaly Extremities: extremities normal, atraumatic, no cyanosis or edema Wound: clean and dry  Lab Results: No results for input(s): WBC, HGB, HCT, PLT in the last 72 hours. BMET: No results for input(s): NA, K, CL, CO2, GLUCOSE, BUN, CREATININE, CALCIUM in the last 72 hours.  PT/INR: No results for input(s): LABPROT, INR in the last 72 hours. ABG    Component Value Date/Time   PHART 7.432 09/14/2019 1405   HCO3 24.1 09/14/2019 1405   TCO2 28 09/04/2019 1241   ACIDBASEDEF 6.0 (H) 09/04/2019 0522   O2SAT 95.4 09/14/2019 1405   CBG  (last 3)  Recent Labs    10/01/19 0023 10/01/19 0518 10/01/19 0753  GLUCAP 118* 103* 95    Assessment/Plan: S/P Procedure(s) (LRB): ESOPHAGOGASTRODUODENOSCOPY (EGD) WITH PROPOFOL (N/A) BALLOON DILATION (N/A)  1. CV- hemodynamically stable 2. Pulm- no acute issues, continue IS 3. GI- for EGD with dilatation today per GI 4. Chronic back pain- continue pain medications 5. Lovenox for H/O DVT prior to admission 6. Dispo- patient stable, for EGD with dilatation today, CSW is working on discharge placement, I will make follow up appointment at [facility] for rectal issue, continue current care   LOS: 28 days    Ellwood Handler, PA-C  10/01/2019 Patient seen and examined, agree with above For repeat dilation today  [name], MD [facility] [phone]

## 2019-10-01 NOTE — Plan of Care (Signed)

## 2019-10-02 ENCOUNTER — Ambulatory Visit: Payer: Self-pay | Admitting: Thoracic Surgery (Cardiothoracic Vascular Surgery)

## 2019-10-02 LAB — GLUCOSE, CAPILLARY
Glucose-Capillary: 112 mg/dL — ABNORMAL HIGH (ref 70–99)
Glucose-Capillary: 119 mg/dL — ABNORMAL HIGH (ref 70–99)
Glucose-Capillary: 121 mg/dL — ABNORMAL HIGH (ref 70–99)
Glucose-Capillary: 126 mg/dL — ABNORMAL HIGH (ref 70–99)
Glucose-Capillary: 128 mg/dL — ABNORMAL HIGH (ref 70–99)
Glucose-Capillary: 135 mg/dL — ABNORMAL HIGH (ref 70–99)

## 2019-10-02 MED ORDER — ENSURE ENLIVE PO LIQD
237.0000 mL | Freq: Two times a day (BID) | ORAL | Status: DC
  Administered 2019-10-03 – 2019-10-17 (×18): 237 mL via ORAL

## 2019-10-02 MED ORDER — OSMOLITE 1.5 CAL PO LIQD
1000.0000 mL | ORAL | Status: AC
  Administered 2019-10-03 – 2019-10-14 (×12): 1000 mL
  Filled 2019-10-02 (×15): qty 1000

## 2019-10-02 NOTE — TOC Progression Note (Addendum)
Transition of Care (TOC) - Progression Note    Patient Details  Name: George Wade. MRN: [id number] Date of Birth: 18-Mar-1956  Transition of Care [facility]) CM/SW [place], [place] Phone Number: 10/02/2019, 8:02 AM  Clinical Narrative:     Update: CSW called and left a voicemail with the servant center. CSW received an email back from [place]. They have started an application at [facility]. Toniann Ket is his Development worker, community.   CSW is discussing with TOC leadership for disposition planning for the patient.   CSW will continue to follow and assist with disposition planning.   Expected Discharge Plan: [place] Barriers to Discharge: Inadequate or no insurance, Continued Medical Work up  Expected Discharge Plan and Services Expected Discharge Plan: [place] arrangements for the past 2 months: Hotel/Motel                                       Social Determinants of Health (SDOH) Interventions    Readmission Risk Interventions No flowsheet data found.

## 2019-10-02 NOTE — Progress Notes (Signed)
Physical Therapy Treatment Patient Details Name: George Mich. MRN: [id number] DOB: 23-Jul-1956 Today's Date: 10/02/2019    History of Present Illness Pt is a 63 y.o. male admitted 09/03/19 with c/o R-side chest pain. CT showed R hydropneumothorax and extensive distal pneumomediastinum. Pt s/p R major thoracotomy for esophageal performation with intercostal muscle flap on 11/24. s/p EGD and balloon dilation 12/21.  PMH includes alcohol abuse, non-Hodgkin's lymphoma, tonsillar cancer, HTN, PE, esophageal stricture. Of note, recent admission 07/2019-08/2019 for chest pain and alcohol-induced mood disorder.    PT Comments    Patient progressing slowly towards PT goals. Tolerated higher level balance challenges today- backwards walking, head turns, changes in gait speed- with only mild deviations in gait but no overt LOB. Requires Min guard-Min A for balance/safety. HR up to 121 bpm. Declined stair training today. Pt with impaired awareness of lines and gets tangled at times when mobilizing in room. 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 Discussed using RW when not working with therapy for mobility. Will continue to follow.   Follow Up Recommendations  SNF;Supervision for mobility/OOB     Equipment Recommendations  Other (comment)(TBA)    Recommendations for Other Services       Precautions / Restrictions Precautions Precautions: Fall Precaution Comments: PEG Restrictions Weight Bearing Restrictions: No    Mobility  Bed Mobility Overal bed mobility: Modified Independent Bed Mobility: Supine to Sit;Sit to Supine     Supine to sit: Modified independent (Device/Increase time);HOB elevated Sit to supine: Modified independent (Device/Increase time);HOB elevated   General bed mobility comments: No assist needed, HOB slightly elevated.  Transfers Overall transfer level: Needs assistance Equipment used: None Transfers: Sit to/from Stand Sit  to Stand: Supervision         General transfer comment: supervision for lines and safety without AD, getting tangled in lines at times.  Ambulation/Gait Ambulation/Gait assistance: Min guard;Min assist Gait Distance (Feet): 400 Feet Assistive device: None Gait Pattern/deviations: Step-through pattern;Drifts right/left;Narrow base of support Gait velocity: decreased  but improving  Gait velocity interpretation: 1.31 - 2.62 ft/sec, indicative of limited community ambulator General Gait Details: Slow, guarded gait, close Min guard-Min A with balance challenges. Occasionally reaching for rail for support. HR up to 121 bpm. 2/4 DOE. See balance section for details.   Stairs         General stair comments: Declined stairs "i have no calories in me."   Wheelchair Mobility    Modified Rankin (Stroke Patients Only)       Balance Overall balance assessment: Needs assistance Sitting-balance support: Feet supported;No upper extremity supported Sitting balance-Leahy Scale: Good     Standing balance support: During functional activity Standing balance-Leahy Scale: Fair Standing balance comment: Needs UE support when moving in room; tangled in lines easily.             High level balance activites: Backward walking;Direction changes;Head turns;Turns High Level Balance Comments: Tolerated above with Min guard-Min A for safety, Able to perform changes in gait speed with no imbalance.            Cognition Arousal/Alertness: Awake/alert Behavior During Therapy: WFL for tasks assessed/performed Overall Cognitive Status: Impaired/Different from baseline Area of Impairment: Memory;Safety/judgement                     Memory: Decreased short-term memory   Safety/Judgement: Decreased awareness of safety;Decreased awareness of deficits   Problem Solving: Slow processing General Comments: Pt asking  what therapist's names are and writing down to recall them. Poor  awareness of lines, getting tangled in them in room with mobilizing.      Exercises      General Comments General comments (skin integrity, edema, etc.): VSS on RA.      Pertinent Vitals/Pain Pain Assessment: Faces Faces Pain Scale: Hurts little more Pain Location: chest tube site Pain Descriptors / Indicators: Sore;Discomfort Pain Intervention(s): Monitored during session    Home Living                      Prior Function            PT Goals (current goals can now be found in the care plan section) Progress towards PT goals: Progressing toward goals    Frequency    Min 3X/week      PT Plan Current plan remains appropriate    Co-evaluation              AM-PAC PT "6 Clicks" Mobility   Outcome Measure  Help needed turning from your back to your side while in a flat bed without using bedrails?: None Help needed moving from lying on your back to sitting on the side of a flat bed without using bedrails?: None Help needed moving to and from a bed to a chair (including a wheelchair)?: A Little Help needed standing up from a chair using your arms (e.g., wheelchair or bedside chair)?: A Little Help needed to walk in hospital room?: A Little Help needed climbing 3-5 steps with a railing? : A Little 6 Click Score: 20    End of Session Equipment Utilized During Treatment: Gait belt Activity Tolerance: Patient tolerated treatment well Patient left: in bed;with call bell/phone within reach Nurse Communication: Mobility status PT Visit Diagnosis: Other abnormalities of gait and mobility (R26.89);Muscle weakness (generalized) (M62.81);Pain Pain - Right/Left: Right Pain - part of body: (chest tube site)     Time: 3557-3220 PT Time Calculation (min) (ACUTE ONLY): 22 min  Charges:  $Neuromuscular Re-education: 8-22 mins                     Marisa Severin, PT, DPT Acute Rehabilitation Services Pager [phone] Office [place] 10/02/2019, 3:44 PM

## 2019-10-02 NOTE — Progress Notes (Signed)
George Wade. 12:02 PM  Subjective: Patient doing well from his dilation and is looking forward to eating soft solids and has no new complaints and only hurts from his previous chest tube site and no determination on when he is leaving the hospital  Objective: Vital signs stable afebrile no acute distress chest wall nontender abdomen is soft nontender no new labs or x-rays  Assessment: Tight esophageal stricture  Plan: Probably would benefit from repeat dilation before discharge unless he goes this week and we will be on standby to help as needed otherwise happy to set up for outpatient dilation in a few weeks and please call us back if we can be of any further assistance with this hospital stay or a few days before discharge to set up above  [facility]  office [phone] After 5PM or if no answer call [phone]

## 2019-10-02 NOTE — Progress Notes (Signed)
Late lunch  tray  Of soft solid diet served. Pt ate 20 % only claiming that food is so dry. Continued with osmolite  tube feeding since intake is poor. Denied any difficulty swallowing , continue to monitor

## 2019-10-02 NOTE — Progress Notes (Addendum)
1 Day Post-Op Procedure(s) (LRB): ESOPHAGOGASTRODUODENOSCOPY (EGD) WITH PROPOFOL (N/A) BALLOON DILATION (N/A) Subjective: Feels good except for right-side chest wall pain near CT insertion sites.  Tolerating clear liquid diet, no nausea.  Having frequent loose stools.  Objective: Vital signs in last 24 hours: Temp:  [97.5 F (36.4 C)-100 F (37.8 C)] 100 F (37.8 C) (12/22 0708) Pulse Rate:  [60-97] 60 (12/22 0708) Cardiac Rhythm: Normal sinus rhythm (12/22 0300) Resp:  [12-24] 15 (12/22 0708) BP: (92-126)/(71-94) 123/87 (12/22 0708) SpO2:  [96 %-100 %] 96 % (12/22 0708) Weight:  [68.2 kg] 68.2 kg (12/22 0500)     Intake/Output from previous day: 12/21 0701 - 12/22 0700 In: 540 [P.O.:240; I.V.:300] Out: 1010 [Urine:1000; Blood:10] Intake/Output this shift: No intake/output data recorded.  Physical Exam: General appearance: alert, cooperative and no distress Heart: regular rate and rhythm Lungs: clear to auscultation bilaterally Abdomen: soft, non-tender  Wound: clean and dry, silk sutures in place at CT sites.   Lab Results: No results for input(s): WBC, HGB, HCT, PLT in the last 72 hours. BMET: No results for input(s): NA, K, CL, CO2, GLUCOSE, BUN, CREATININE, CALCIUM in the last 72 hours.  PT/INR: No results for input(s): LABPROT, INR in the last 72 hours. ABG    Component Value Date/Time   PHART 7.432 09/14/2019 1405   HCO3 24.1 09/14/2019 1405   TCO2 28 09/04/2019 1241   ACIDBASEDEF 6.0 (H) 09/04/2019 0522   O2SAT 95.4 09/14/2019 1405   CBG (last 3)  Recent Labs    10/01/19 2017 10/02/19 0005 10/02/19 0507  GLUCAP 140* 121* 126*    Assessment/Plan: S/P Procedure(s) (LRB): ESOPHAGOGASTRODUODENOSCOPY (EGD) WITH PROPOFOL (N/A) BALLOON DILATION (N/A)  -POD-28right thoracotomy, repair of esophogeal perforation with intercostal muscle flap. -POD-6 EGD with dilation of distal esophageal stricture to 19mm . -POD-1 repeat EGD with dilation of distal  esophageal stricture to 57mm Tolerating PO clear liquids.   PICC placed 12//9, site looks good.  -Nutrition- he has been toleratingTF at goal rateof 24ml hrwith no sign of obstruction.Will defer diet advancement to GI service.   -H/O EtOH abuse-Calm and cooperative. Continue Seroquel  -Mild anemia- Hct.is stable.Monitor.  -Hypokalemia-Resolved.Monitor  -H/O PE prior to surgery. On full dose Lovenox 65mg  [place] q12H.     LOS: 29 days    Malon Kindle [phone] 10/02/2019 Patient seen and examined, agree with above Tolerating clears Gi has advanced to soft diet but he hasn't tried it yet  [name], MD [facility] [phone]

## 2019-10-02 NOTE — Progress Notes (Signed)
Reported by MSW  that last Friday pt. verbalized to kill himself. Also pt claimed today that he is depressed bec, his children hate him. Allowed pt to ventilate himself. PA made aware  And suggested psych consult. Continue to monitor.

## 2019-10-02 NOTE — Progress Notes (Signed)
Nutrition Follow-up  DOCUMENTATION CODES:   Non-severe (moderate) malnutrition in context of social or environmental circumstances  INTERVENTION:   Transition to nocturnal tube feeding: - Osmolite 1.5 @ 75 ml/hr via J-tube to run for 12 hours from 1900 to 0700 daily - Pro-stat 30 ml BID  Nocturnal tube feeding regimen provides1550kcal, 86grams of protein, and 625m of H2O (70% of kcal needs, 78% of protein needs).  - Ensure Enlive po BID, each supplement provides 350 kcal and 20 grams of protein  NUTRITION DIAGNOSIS:   Moderate Malnutrition related to social / environmental circumstances (homeless, ETOH abuse) as evidenced by moderate muscle depletion, moderate fat depletion, mild fat depletion.  Ongoing, being addressed via TF  GOAL:   Patient will meet greater than or equal to 90% of their needs  Met via TF  MONITOR:   Weight trends, Labs, I & O's, TF tolerance, Skin  REASON FOR ASSESSMENT:   Malnutrition Screening Tool    ASSESSMENT:   Patient with PMH significant for ETOH abuse, non-hodgkin's lymphoma, tonsillar cancer, esophogeal stricture s/p PEG, and HTN. Presents this admission with esophageal perforation.  11/24 -rightthoracotomy, repair of perforation, extubated 11/25 - G-tube converted to G-J tube 11/29 - pt removed NG tube, G-J tube clogged 12/01 - G-J tube unclogged, swallow study showing residual distal esophageal stricture and no leak of contrast 12/02 - diet advanced to clear liquids 12/04 - NPO, G-J tube fell out, transferred to ICU for sepsis 12/05 - transferred back to progressive care, G-J tube replaced 12/08 - PICC line 12/10 - swallow study showing no esophageal leak and severe distal esophageal stricture 12/11 - diet advanced to sips of clear liquids 12/16 - s/p EGD with dilation 12/21 - s/p EGD with dilation  Weight stable over the last week.  Diet advanced to Soft this AM.  Spoke with pt at bedside. Pt states that at mealtimes  he has been consume some watered-down juice and broth. Pt reports that the doctor told him not to eat jello because it was very hard to digest. Pt states he avoids the cranberry juice due to acidity.  Pt denies any issues with his TF other than the pump beeping multiple times during the night.  Discussed diet advancement with CTS. Will transition pt to nocturnal tube feeds. Will also order Ensure Enlive supplements. RD will monitor PO intake and adjust TF regimen as appropriate.  Meal Completion: 25-50% (clear liquids only)  Current TF: Osmolite 1.5 @ 60 ml/hr, Pro-stat 30 ml BID  Medications reviewed and include: folic acid, protonix, thiamine  Labs reviewed. CBG's: 86-140 x 24 hours  UOP: 1000 ml x 24 hours  Diet Order:   Diet Order            DIET SOFT Room service appropriate? Yes; Fluid consistency: Thin  Diet effective now              EDUCATION NEEDS:   Not appropriate for education at this time  Skin:  Skin Assessment: Skin Integrity Issues: Skin Integrity Issues: Incisions: R chest  Last BM:  10/01/19 type 6  Height:   Ht Readings from Last 1 Encounters:  09/04/19 _0  (1.727 m)    Weight:   Wt Readings from Last 1 Encounters:  10/02/19 68.2 kg    Ideal Body Weight:  70 kg  BMI:  Body mass index is 22.86 kg/m.  Estimated Nutritional Needs:   Kcal:  2200-2400 kcal  Protein:  110-125 grams  Fluid:  >/= 2.2 L/day  Kate Jablonski Lavi Sheehan, MS, RD, LDN Inpatient Clinical Dietitian Pager: [phone] Weekend/After Hours: [phone]  

## 2019-10-03 LAB — COMPREHENSIVE METABOLIC PANEL
ALT: 8 U/L (ref 0–44)
AST: 17 U/L (ref 15–41)
Albumin: 2.5 g/dL — ABNORMAL LOW (ref 3.5–5.0)
Alkaline Phosphatase: 90 U/L (ref 38–126)
Anion gap: 8 (ref 5–15)
BUN: 11 mg/dL (ref 8–23)
CO2: 24 mmol/L (ref 22–32)
Calcium: 8.9 mg/dL (ref 8.9–10.3)
Chloride: 102 mmol/L (ref 98–111)
Creatinine, Ser: 0.54 mg/dL — ABNORMAL LOW (ref 0.61–1.24)
GFR calc Af Amer: >60 mL/min (ref >60)
GFR calc non Af Amer: >60 mL/min (ref >60)
Glucose, Bld: 105 mg/dL — ABNORMAL HIGH (ref 70–99)
Potassium: 4.1 mmol/L (ref 3.5–5.1)
Sodium: 134 mmol/L — ABNORMAL LOW (ref 135–145)
Total Bilirubin: 0.2 mg/dL — ABNORMAL LOW (ref 0.3–1.2)
Total Protein: 5.4 g/dL — ABNORMAL LOW (ref 6.5–8.1)

## 2019-10-03 LAB — GLUCOSE, CAPILLARY
Glucose-Capillary: 142 mg/dL — ABNORMAL HIGH (ref 70–99)
Glucose-Capillary: 106 mg/dL — ABNORMAL HIGH (ref 70–99)
Glucose-Capillary: 88 mg/dL (ref 70–99)
Glucose-Capillary: 90 mg/dL (ref 70–99)
Glucose-Capillary: 124 mg/dL — ABNORMAL HIGH (ref 70–99)

## 2019-10-03 NOTE — Progress Notes (Addendum)
2 Days Post-Op Procedure(s) (LRB): ESOPHAGOGASTRODUODENOSCOPY (EGD) WITH PROPOFOL (N/A) BALLOON DILATION (N/A) Subjective: Started soft diet yesterday and he has been able to tolerate this fairly well. He said he did the best with breakfast this morning. He has noticed some mild discomfort with swallowing but no nausea. Primary complaint remains pain adjacent to chest tube insertion sites.   Objective: Vital signs in last 24 hours: Temp:  [97.8 F (36.6 C)-98.3 F (36.8 C)] 97.8 F (36.6 C) (12/23 0736) Pulse Rate:  [82-94] 93 (12/23 0736) Cardiac Rhythm: Heart block (12/22 1925) Resp:  [15-22] 17 (12/23 0736) BP: (86-119)/(76-97) 111/85 (12/23 0736) SpO2:  [95 %-99 %] 98 % (12/23 0736) Weight:  [67.8 kg] 67.8 kg (12/23 0500)     Intake/Output from previous day: 12/22 0701 - 12/23 0700 In: 1660 [P.O.:340; NG/GT:1200] Out: 4150 [Urine:4150] Intake/Output this shift: No intake/output data recorded.  Physical Exam: General appearance:alert, cooperative and mild distress Heart:sinus tach Lungs:clear to auscultation bilaterally Abdomen:soft, non-tender, G-J tube secure, site is ok.   Wound:the thoracotomy incision is clean and dry, developing some inflammation around silk sutures at CT sites.   Lab Results: No results for input(s): WBC, HGB, HCT, PLT in the last 72 hours. BMET: No results for input(s): NA, K, CL, CO2, GLUCOSE, BUN, CREATININE, CALCIUM in the last 72 hours.  PT/INR: No results for input(s): LABPROT, INR in the last 72 hours. ABG    Component Value Date/Time   PHART 7.432 09/14/2019 1405   HCO3 24.1 09/14/2019 1405   TCO2 28 09/04/2019 1241   ACIDBASEDEF 6.0 (H) 09/04/2019 0522   O2SAT 95.4 09/14/2019 1405   CBG (last 3)  Recent Labs    10/02/19 1927 10/02/19 2344 10/03/19 0411  GLUCAP 135* 112* 142*    Assessment/Plan: S/P Procedure(s) (LRB): ESOPHAGOGASTRODUODENOSCOPY (EGD) WITH PROPOFOL (N/A) BALLOON DILATION (N/A)  -POD-29right  thoracotomy, repair of esophogeal perforation with intercostal muscle flap. -POD-7EGD with dilation of distal esophageal stricture to 73mm . -POD-2 repeat EGD with dilation of distal esophageal stricture to 84mm. Dr. Watt Climes recommends repeat EGD, possible dilation in a few weeks.   Tolerating PO soft diet. Stopped day-time TF but continuing to infuse at night.  Will remove CT sutures.   -CM team is working on disposition.   -H/O EtOH abuse-Calm and cooperative. Continue Seroquel  -Mild anemia- Hct.has been stable.Monitor.  -Hypokalemia-Resolved.Monitor  -H/O PE prior to surgery. On full dose Lovenox 65mg  [place] q12H.   LOS: 30 days    [name], [place] [phone] 10/03/2019 Patient seen and examined, agree with above Will convert to night time J tube feedings Ready for dc once arrangements are complete  [name], MD [facility] [phone]

## 2019-10-03 NOTE — Progress Notes (Signed)
Sutures on right side of the chest post chest tube site removed, tolerated well. Sites are reddened, cleansed wit povidone. No drainage noted.

## 2019-10-03 NOTE — Progress Notes (Signed)
Responded to request from [name] through his nurse that he wanted to see a Chaplain.  On my way to the unit I was aware of someone playing Christmas music on the piano in the atrium.  Checking with his nurse and Charge Nurse of the unit, got approval to wheel George Wade just outside of the unit so he could hear the music. We were off the unit (while he was still being monitored) for about 30 minutes.  George Wade and I talked about how he came to be homeless, about the mistakes he's made in his life, about his current situation, about feelings of unworthiness, his fears about life after death.  We are both [employer] so we hit it off right away.  I promised to re-visit and we laid out a plan for some positive things George Wade has control over that have given him the first glimpse of some possible positive outcomes when his health is better.  I plan to revisit often.  De Burrs Chaplain Resident

## 2019-10-03 NOTE — Progress Notes (Signed)
Went to listen to piano music in the lobby escorted by the chaplain and back after 30 min .assisted back to bed.

## 2019-10-03 NOTE — Progress Notes (Deleted)
Attempted to assist her in a chair but complaining of short of breath, pulse ox-08% on 2L [place], resp.- 20/min.

## 2019-10-04 ENCOUNTER — Inpatient Hospital Stay (HOSPITAL_COMMUNITY): Payer: Self-pay

## 2019-10-04 LAB — GLUCOSE, CAPILLARY
Glucose-Capillary: 100 mg/dL — ABNORMAL HIGH (ref 70–99)
Glucose-Capillary: 118 mg/dL — ABNORMAL HIGH (ref 70–99)
Glucose-Capillary: 87 mg/dL (ref 70–99)
Glucose-Capillary: 91 mg/dL (ref 70–99)
Glucose-Capillary: 94 mg/dL (ref 70–99)
Glucose-Capillary: 96 mg/dL (ref 70–99)

## 2019-10-04 LAB — CBC
HCT: 28.1 % — ABNORMAL LOW (ref 39.0–52.0)
Hemoglobin: 8.6 g/dL — ABNORMAL LOW (ref 13.0–17.0)
MCH: 26.1 pg (ref 26.0–34.0)
MCHC: 30.6 g/dL (ref 30.0–36.0)
MCV: 85.2 fL (ref 80.0–100.0)
Platelets: 295 10*3/uL (ref 150–400)
RBC: 3.3 MIL/uL — ABNORMAL LOW (ref 4.22–5.81)
RDW: 16.3 % — ABNORMAL HIGH (ref 11.5–15.5)
WBC: 5 10*3/uL (ref 4.0–10.5)
nRBC: 0 % (ref 0.0–0.2)

## 2019-10-04 MED ORDER — APIXABAN 5 MG PO TABS
5.0000 mg | ORAL_TABLET | Freq: Two times a day (BID) | ORAL | Status: DC
  Administered 2019-10-04 – 2019-10-08 (×8): 5 mg via ORAL
  Filled 2019-10-04 (×9): qty 1

## 2019-10-04 NOTE — Progress Notes (Signed)
ANTICOAGULATION CONSULT NOTE -  Consult  Pharmacy Consult for enoxaparin > apixaban Indication: DVT  Allergies  Allergen Reactions  . Sulfa Antibiotics Nausea Only    Patient Measurements: Height: 5\' 8"  (172.7 cm) Weight: 149 lb 11.1 oz (67.9 kg) IBW/kg (Calculated) : 68.4   Vital Signs: Temp: 98.8 F (37.1 C) (12/24 1252) Temp Source: Oral (12/24 1252) BP: 122/87 (12/24 1252) Pulse Rate: 97 (12/24 1252)  Labs: Recent Labs    10/03/19 1022 10/04/19 0504  HGB  --  8.6*  HCT  --  28.1*  PLT  --  295  CREATININE 0.54*  --     Estimated Creatinine Clearance: 90.8 mL/min (A) (by C-G formula based on SCr of 0.54 mg/dL (L)).   Medical History: Past Medical History:  Diagnosis Date  . Alcoholism with psychosis with complication, with hallucinations ([place])   . ED (erectile dysfunction)   . Fatigue   . History of pneumonia   . Hypercholesteremia   . Hypertension   . Lymphoma ([place]) 2008   non hodgkins lymphoma  . Rectal bleeding   . 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   Patient was on apixaban PTA for recent PE/DVT - which was held here and on enoxaparin 1mg /kg q12h ( treatment dose)   Patient now taking some food orally and tolerating po meds.  Apixaban has similar pharmacokinetics as enoxaparin - can be held for any EGD dilation  If needed.   Spoke to TCTS PA will change from enoxaparin to apixaban  Cr0.5, crcl 52ml/min   Goal of Therapy:   Monitor platelets by anticoagulation protocol: Yes   Plan:  Stop enoxaparin  Start apixaban 5mg  po BID   Bonnita Nasuti Pharm.D. CPP, BCPS Clinical Pharmacist [phone] 10/04/2019 1:54 PM

## 2019-10-04 NOTE — Progress Notes (Addendum)
3 Days Post-Op Procedure(s) (LRB): ESOPHAGOGASTRODUODENOSCOPY (EGD) WITH PROPOFOL (N/A) BALLOON DILATION (N/A) Subjective: Says he was not able to tolerate PO's at lunchtime yesterday but ate about 60% of his dinner last night and about the asme at breakfast this morning. No nausea, no abdominal pain.  Objective: Vital signs in last 24 hours: Temp:  [98 F (36.7 C)-98.6 F (37 C)] 98.6 F (37 C) (12/24 0757) Pulse Rate:  [91-99] 98 (12/24 0757) Cardiac Rhythm: Sinus tachycardia (12/23 2300) Resp:  [11-23] 23 (12/24 0757) BP: (93-117)/(65-96) 110/96 (12/24 0757) SpO2:  [95 %-99 %] 99 % (12/24 0757) Weight:  [67.9 kg] 67.9 kg (12/24 0500)     Intake/Output from previous day: 12/23 0701 - 12/24 0700 In: 945 [P.O.:120; NG/GT:825] Out: 800 [Urine:800] Intake/Output this shift: No intake/output data recorded.  Physical Exam: General appearance:alert, cooperative and mild distress Heart:sinus rhythm. Lungs:clear, CXR stable since diet resumed.  Abdomen:soft, non-tender, G-J tube secure, site is ok.     Lab Results: Recent Labs    10/04/19 0504  WBC 5.0  HGB 8.6*  HCT 28.1*  PLT 295   BMET:  Recent Labs    10/03/19 1022  NA 134*  K 4.1  CL 102  CO2 24  GLUCOSE 105*  BUN 11  CREATININE 0.54*  CALCIUM 8.9    PT/INR: No results for input(s): LABPROT, INR in the last 72 hours. ABG    Component Value Date/Time   PHART 7.432 09/14/2019 1405   HCO3 24.1 09/14/2019 1405   TCO2 28 09/04/2019 1241   ACIDBASEDEF 6.0 (H) 09/04/2019 0522   O2SAT 95.4 09/14/2019 1405   CBG (last 3)  Recent Labs    10/04/19 0045 10/04/19 0422 10/04/19 0755  GLUCAP 118* 100* 96   EXAM: CHEST - 2 VIEW  COMPARISON:  09/28/2019  FINDINGS: Right-sided PICC line unchanged. Lungs are adequately inflated with improving opacification over the anterior lung base on the lateral film likely improving atelectasis. No evidence of effusion or pneumothorax. Cardiomediastinal  silhouette is within normal. Persistent subcutaneous emphysema over the right neck and chest.  IMPRESSION: Improving mild airspace density over the anterior lung bases on the lateral film likely improving atelectasis.  Right-sided PICC line unchanged.  Subcutaneous emphysema right neck and chest unchanged.   Electronically Signed   By: Marin Olp M.D.   On: 10/04/2019 07:49 Assessment/Plan: S/P Procedure(s) (LRB): ESOPHAGOGASTRODUODENOSCOPY (EGD) WITH PROPOFOL (N/A) BALLOON DILATION (N/A)  -POD-230right thoracotomy, repair of esophogeal perforation with intercostal muscle flap. -POD-8EGD with dilation of distal esophageal stricture to 66mm . -POD-3 repeat EGD with dilation of distal esophageal stricture to 32mm. Dr. Watt Climes recommends repeat EGD, possible dilation in a few weeks.   Tolerating PO soft diet. Stopped day-time TF but continuing to infuse at night.   -CM team is working on disposition, rady for discharge when arrangements can be made..   -H/OEtOH abuse-Calm and cooperative.Continue Seroquel  -Mild anemia- Hct remains able.Monitor.  -Hypokalemia-Resolved.Monitor  -H/O PE prior to surgery. On full dose Lovenox 65mg  [place] q12H.   LOS: 31 days    [name], [place] [phone] 10/04/2019 Patient seen and examined, agree with above  [name], MD [facility] [phone]

## 2019-10-04 NOTE — Progress Notes (Signed)
Physical Therapy Treatment Patient Details Name: George Wade. MRN: [id number] DOB: [birth year] Today's Date: 10/04/2019    History of Present Illness Pt is a 63 y.o. male admitted 09/03/19 with c/o R-side chest pain. CT showed R hydropneumothorax and extensive distal pneumomediastinum. Pt s/p R major thoracotomy for esophageal performation with intercostal muscle flap on 11/24. s/p EGD and balloon dilation 12/21.  PMH includes alcohol abuse, non-Hodgkin's lymphoma, tonsillar cancer, HTN, PE, esophageal stricture. Of note, recent admission 07/2019-08/2019 for chest pain and alcohol-induced mood disorder.    PT Comments    Pt seen for mobility progression. This session focused on gait training without use of AD. Pt tolerated mobility well. Continue to progress as tolerated with anticipated d/c to SNF for further skilled PT services.     Follow Up Recommendations  SNF;Supervision for mobility/OOB     Equipment Recommendations  Other (comment)(TBA)    Recommendations for Other Services       Precautions / Restrictions Precautions Precautions: Fall Precaution Comments: PEG Restrictions Weight Bearing Restrictions: No    Mobility  Bed Mobility Overal bed mobility: Modified Independent Bed Mobility: Supine to Sit;Sit to Supine              Transfers Overall transfer level: Needs assistance Equipment used: None Transfers: Sit to/from Stand Sit to Stand: Supervision            Ambulation/Gait Ambulation/Gait assistance: Min guard;Min assist Gait Distance (Feet): 400 Feet Assistive device: None Gait Pattern/deviations: Step-through pattern;Drifts right/left Gait velocity: decreased  but improving    General Gait Details: Slow, guarded gait, close Min guard-Min A with balance challenges. initially reaching for rail for support. pt denies SOB    Stairs             Wheelchair Mobility    Modified Rankin (Stroke Patients Only)       Balance  Overall balance assessment: Needs assistance Sitting-balance support: Feet supported;No upper extremity supported Sitting balance-Leahy Scale: Good     Standing balance support: During functional activity;No upper extremity supported Standing balance-Leahy Scale: Fair                              Cognition Arousal/Alertness: Awake/alert Behavior During Therapy: WFL for tasks assessed/performed Overall Cognitive Status: Impaired/Different from baseline Area of Impairment: Memory;Safety/judgement                     Memory: Decreased short-term memory   Safety/Judgement: Decreased awareness of safety;Decreased awareness of deficits            Exercises      General Comments        Pertinent Vitals/Pain Pain Assessment: Faces Faces Pain Scale: No hurt    Home Living                      Prior Function            PT Goals (current goals can now be found in the care plan section) Progress towards PT goals: Progressing toward goals    Frequency    Min 3X/week      PT Plan Current plan remains appropriate    Co-evaluation              AM-PAC PT "6 Clicks" Mobility   Outcome Measure  Help needed turning from your back to your side while in a flat bed without using bedrails?: None Help  needed moving from lying on your back to sitting on the side of a flat bed without using bedrails?: None Help needed moving to and from a bed to a chair (including a wheelchair)?: A Little Help needed standing up from a chair using your arms (e.g., wheelchair or bedside chair)?: A Little Help needed to walk in hospital room?: A Little Help needed climbing 3-5 steps with a railing? : A Little 6 Click Score: 20    End of Session Equipment Utilized During Treatment: Gait belt Activity Tolerance: Patient tolerated treatment well Patient left: in bed;with call bell/phone within reach Nurse Communication: Mobility status PT Visit Diagnosis:  Other abnormalities of gait and mobility (R26.89);Muscle weakness (generalized) (M62.81);Pain Pain - Right/Left: Right Pain - part of body: (chest tube site)     Time: 3354-5625 PT Time Calculation (min) (ACUTE ONLY): 28 min  Charges:  $Gait Training: 23-37 mins                     Earney Navy, PTA Acute Rehabilitation Services Pager: [phone] Office: [phone]     Darliss Cheney 10/04/2019, 4:53 PM

## 2019-10-05 LAB — GLUCOSE, CAPILLARY
Glucose-Capillary: 100 mg/dL — ABNORMAL HIGH (ref 70–99)
Glucose-Capillary: 104 mg/dL — ABNORMAL HIGH (ref 70–99)
Glucose-Capillary: 105 mg/dL — ABNORMAL HIGH (ref 70–99)
Glucose-Capillary: 124 mg/dL — ABNORMAL HIGH (ref 70–99)
Glucose-Capillary: 125 mg/dL — ABNORMAL HIGH (ref 70–99)
Glucose-Capillary: 127 mg/dL — ABNORMAL HIGH (ref 70–99)
Glucose-Capillary: 74 mg/dL (ref 70–99)
Glucose-Capillary: 93 mg/dL (ref 70–99)
Glucose-Capillary: 99 mg/dL (ref 70–99)

## 2019-10-05 MED ORDER — OXYCODONE HCL 5 MG/5ML PO SOLN
5.0000 mg | Freq: Four times a day (QID) | ORAL | Status: DC | PRN
  Administered 2019-10-05 – 2019-11-02 (×3): 5 mg
  Filled 2019-10-05 (×3): qty 5

## 2019-10-05 MED ORDER — GLUCOSE 40 % PO GEL
ORAL | Status: AC
  Filled 2019-10-05: qty 1

## 2019-10-05 NOTE — Plan of Care (Signed)
  Problem: Education: Goal: Knowledge of General Education information will improve Description: Including pain rating scale, medication(s)/side effects and non-pharmacologic comfort measures Outcome: Progressing   Problem: Health Behavior/Discharge Planning: Goal: Ability to manage health-related needs will improve Outcome: Progressing   Problem: Clinical Measurements: Goal: Ability to maintain clinical measurements within normal limits will improve Outcome: Progressing Goal: Will remain free from infection Outcome: Progressing Goal: Diagnostic test results will improve Outcome: Progressing Goal: Respiratory complications will improve Outcome: Progressing Goal: Cardiovascular complication will be avoided Outcome: Progressing   Problem: Activity: Goal: Risk for activity intolerance will decrease Outcome: Progressing   Problem: Nutrition: Goal: Adequate nutrition will be maintained Outcome: Progressing   Problem: Coping: Goal: Level of anxiety will decrease Outcome: Progressing   Problem: Elimination: Goal: Will not experience complications related to bowel motility Outcome: Progressing Goal: Will not experience complications related to urinary retention Outcome: Progressing   Problem: Pain Managment: Goal: General experience of comfort will improve Outcome: Progressing   Problem: Safety: Goal: Ability to remain free from injury will improve Outcome: Progressing   Problem: Skin Integrity: Goal: Risk for impaired skin integrity will decrease Outcome: Progressing   Problem: Bowel/Gastric: Goal: Gastrointestinal status for postoperative course will improve Outcome: Progressing   Problem: Nutritional: Goal: Ability to achieve adequate nutritional intake will improve Outcome: Progressing   Problem: Clinical Measurements: Goal: Postoperative complications will be avoided or minimized Outcome: Progressing   Problem: Respiratory: Goal: Ability to maintain a  clear airway will improve Outcome: Progressing   Problem: Skin Integrity: Goal: Demonstration of wound healing without infection will improve Outcome: Progressing

## 2019-10-05 NOTE — Progress Notes (Signed)
Patient's CBG 74. Alerted Dr. Orvan Seen that patient had refused night feed. Will  Continue to monitor CBG.

## 2019-10-05 NOTE — Progress Notes (Signed)
4 Days Post-Op Procedure(s) (LRB): ESOPHAGOGASTRODUODENOSCOPY (EGD) WITH PROPOFOL (N/A) BALLOON DILATION (N/A) Subjective: A little difficulty swallowing  Objective: Vital signs in last 24 hours: Temp:  [96.1 F (35.6 C)-98.8 F (37.1 C)] 98.2 F (36.8 C) (12/25 0812) Pulse Rate:  [89-97] 97 (12/25 0812) Cardiac Rhythm: Normal sinus rhythm (12/25 0722) Resp:  [14-20] 14 (12/25 0328) BP: (86-138)/(70-97) 138/97 (12/25 0812) SpO2:  [96 %-100 %] 96 % (12/25 0812) Weight:  [67.5 kg] 67.5 kg (12/25 0606)  Hemodynamic parameters for last 24 hours:    Intake/Output from previous day: 12/24 0701 - 12/25 0700 In: 30  Out: 2650 [Urine:2650] Intake/Output this shift: No intake/output data recorded.  General appearance: alert and cooperative Neurologic: intact Heart: regular rate and rhythm, S1, S2 normal, no murmur, click, rub or gallop Extremities: extremities normal, atraumatic, no cyanosis or edema  Lab Results: Recent Labs    10/04/19 0504  WBC 5.0  HGB 8.6*  HCT 28.1*  PLT 295   BMET:  Recent Labs    10/03/19 1022  NA 134*  K 4.1  CL 102  CO2 24  GLUCOSE 105*  BUN 11  CREATININE 0.54*  CALCIUM 8.9    PT/INR: No results for input(s): LABPROT, INR in the last 72 hours. ABG    Component Value Date/Time   PHART 7.432 09/14/2019 1405   HCO3 24.1 09/14/2019 1405   TCO2 28 09/04/2019 1241   ACIDBASEDEF 6.0 (H) 09/04/2019 0522   O2SAT 95.4 09/14/2019 1405   CBG (last 3)  Recent Labs    10/05/19 0342 10/05/19 0520 10/05/19 0845  GLUCAP 74 105* 99    Assessment/Plan: S/P Procedure(s) (LRB): ESOPHAGOGASTRODUODENOSCOPY (EGD) WITH PROPOFOL (N/A) BALLOON DILATION (N/A) ok for discharge to rehab when site is found   LOS: 32 days    Wonda Olds 10/05/2019

## 2019-10-06 LAB — GLUCOSE, CAPILLARY
Glucose-Capillary: 121 mg/dL — ABNORMAL HIGH (ref 70–99)
Glucose-Capillary: 97 mg/dL (ref 70–99)
Glucose-Capillary: 97 mg/dL (ref 70–99)
Glucose-Capillary: 132 mg/dL — ABNORMAL HIGH (ref 70–99)
Glucose-Capillary: 110 mg/dL — ABNORMAL HIGH (ref 70–99)
Glucose-Capillary: 123 mg/dL — ABNORMAL HIGH (ref 70–99)

## 2019-10-06 NOTE — Progress Notes (Signed)
After checking with nurse and Unit Director for approval, took my computer to [place] room where we watched live stream of [employer]  Christmas Eve service.  Earlier had procured two consecrated hosts from [place] so Liliane Channel was able to have communion on Christmas Eve.  He said it meant a lot to him.  De Burrs Chaplain Resident

## 2019-10-06 NOTE — Progress Notes (Signed)
Pt states he does not want to be given oxycodone anymore because it is an opioid. Pt states that he told previous staff that he did not want to take it anymore. This RN will pass information on to following nurses.

## 2019-10-06 NOTE — Progress Notes (Signed)
      [address]       [place] [address]             [phone]      5 Days Post-Op Procedure(s) (LRB): ESOPHAGOGASTRODUODENOSCOPY (EGD) WITH PROPOFOL (N/A) BALLOON DILATION (N/A) Subjective: Had a difficult day with swallowing at lunch yesterday, believes he ate too much, too fast. Did well with breakfast this am and feels well overall  Objective: Vital signs in last 24 hours: Temp:  [98 F (36.7 C)-98.8 F (37.1 C)] 98.3 F (36.8 C) (12/26 1044) Pulse Rate:  [91-98] 91 (12/26 1044) Cardiac Rhythm: Normal sinus rhythm (12/26 0733) Resp:  [14-20] 18 (12/26 1044) BP: (92-149)/(78-97) 118/97 (12/26 1044) SpO2:  [98 %-99 %] 99 % (12/26 1044) Weight:  [68.9 kg] 68.9 kg (12/26 0500)  Hemodynamic parameters for last 24 hours:    Intake/Output from previous day: 12/25 0701 - 12/26 0700 In: 4020 [P.O.:840; I.V.:30; NG/GT:3150] Out: 4100 [Urine:4100] Intake/Output this shift: Total I/O In: 320 [P.O.:300; I.V.:20] Out: 1500 [Urine:1500]  General appearance: alert, cooperative and no distress Heart: regular rate and rhythm Lungs: clear to auscultation bilaterally Abdomen: benign Extremities: no edema or calf tenderness Wound: incis healed well  Lab Results: Recent Labs    10/04/19 0504  WBC 5.0  HGB 8.6*  HCT 28.1*  PLT 295   BMET: No results for input(s): NA, K, CL, CO2, GLUCOSE, BUN, CREATININE, CALCIUM in the last 72 hours.  PT/INR: No results for input(s): LABPROT, INR in the last 72 hours. ABG    Component Value Date/Time   PHART 7.432 09/14/2019 1405   HCO3 24.1 09/14/2019 1405   TCO2 28 09/04/2019 1241   ACIDBASEDEF 6.0 (H) 09/04/2019 0522   O2SAT 95.4 09/14/2019 1405   CBG (last 3)  Recent Labs    10/05/19 2320 10/06/19 0543 10/06/19 0748  GLUCAP 125* 121* 97    Meds Scheduled Meds: . acetaminophen (TYLENOL) oral liquid 160 mg/5 mL  650 mg Per Tube Q6H  . apixaban  5 mg Oral BID  . chlorhexidine  15 mL Mouth Rinse BID    . Chlorhexidine Gluconate Cloth  6 each Topical Daily  . feeding supplement (ENSURE ENLIVE)  237 mL Oral BID BM  . feeding supplement (OSMOLITE 1.5 CAL)  1,000 mL Per Tube Q24H  . feeding supplement (PRO-STAT SUGAR FREE 64)  30 mL Per Tube BID  . folic acid  1 mg Intravenous Daily  . mouth rinse  15 mL Mouth Rinse q12n4p  . pantoprazole (PROTONIX) IV  40 mg Intravenous Q12H  . QUEtiapine  100 mg Per J Tube QHS  . sodium chloride flush  10-40 mL Intracatheter Q12H  . thiamine injection  100 mg Intravenous Daily   Continuous Infusions: . sodium chloride 10 mL/hr at 09/20/19 1800   PRN Meds:.sodium chloride, acetaminophen, guaiFENesin, hydrocortisone cream, ipratropium-albuterol, LORazepam, ondansetron (ZOFRAN) IV, oxyCODONE, sodium chloride flush, traMADol  Xrays No results found.  Assessment/Plan: S/P Procedure(s) (LRB): ESOPHAGOGASTRODUODENOSCOPY (EGD) WITH PROPOFOL (N/A) BALLOON DILATION (N/A)  1 stable on current RX/TX plans 2 awaits placement   LOS: 33 days    George Wade [facility] 10/06/2019 Pager [phone]

## 2019-10-06 NOTE — Progress Notes (Signed)
Follow up visit with George Wade.  He had a setback today which discouraged him. We discussed that and his plan to not let it get him down.  He is discouraged the social worker has not visited with him since 12/17.  I will send social worker a note that [name] needs her help.   Plan to continue to visit George Wade while he is a patient here.  De Burrs Chaplain Resident

## 2019-10-07 LAB — GLUCOSE, CAPILLARY
Glucose-Capillary: 114 mg/dL — ABNORMAL HIGH (ref 70–99)
Glucose-Capillary: 118 mg/dL — ABNORMAL HIGH (ref 70–99)
Glucose-Capillary: 120 mg/dL — ABNORMAL HIGH (ref 70–99)
Glucose-Capillary: 129 mg/dL — ABNORMAL HIGH (ref 70–99)
Glucose-Capillary: 94 mg/dL (ref 70–99)
Glucose-Capillary: 95 mg/dL (ref 70–99)

## 2019-10-07 NOTE — Progress Notes (Signed)
      [address]       [place] [address]             [phone]      6 Days Post-Op Procedure(s) (LRB): ESOPHAGOGASTRODUODENOSCOPY (EGD) WITH PROPOFOL (N/A) BALLOON DILATION (N/A) Subjective: Intermit dysphagia, some meals going well others poorly C/O fecal incontinence(not a new issue)  Objective: Vital signs in last 24 hours: Temp:  [97.6 F (36.4 C)-98 F (36.7 C)] 97.6 F (36.4 C) (12/27 0803) Pulse Rate:  [57-109] 94 (12/27 0803) Cardiac Rhythm: Normal sinus rhythm (12/27 0745) Resp:  [16-20] 16 (12/27 0803) BP: (91-118)/(65-93) 108/82 (12/27 0803) SpO2:  [95 %-100 %] 100 % (12/27 0803) Weight:  [69.6 kg] 69.6 kg (12/27 0500)  Hemodynamic parameters for last 24 hours:    Intake/Output from previous day: 12/26 0701 - 12/27 0700 In: 800 [P.O.:780; I.V.:20] Out: 3125 [Urine:3125] Intake/Output this shift: Total I/O In: 20 [I.V.:20] Out: 600 [Urine:600]  General appearance: alert, cooperative and no distress Heart: regular rate and rhythm Lungs: clear to auscultation bilaterally Abdomen: benign Extremities: no edema Wound: incis healing well  Lab Results: No results for input(s): WBC, HGB, HCT, PLT in the last 72 hours. BMET: No results for input(s): NA, K, CL, CO2, GLUCOSE, BUN, CREATININE, CALCIUM in the last 72 hours.  PT/INR: No results for input(s): LABPROT, INR in the last 72 hours. ABG    Component Value Date/Time   PHART 7.432 09/14/2019 1405   HCO3 24.1 09/14/2019 1405   TCO2 28 09/04/2019 1241   ACIDBASEDEF 6.0 (H) 09/04/2019 0522   O2SAT 95.4 09/14/2019 1405   CBG (last 3)  Recent Labs    10/06/19 2306 10/07/19 0336 10/07/19 0810  GLUCAP 123* 120* 118*    Meds Scheduled Meds: . acetaminophen (TYLENOL) oral liquid 160 mg/5 mL  650 mg Per Tube Q6H  . apixaban  5 mg Oral BID  . chlorhexidine  15 mL Mouth Rinse BID  . Chlorhexidine Gluconate Cloth  6 each Topical Daily  . feeding supplement (ENSURE ENLIVE)  237 mL  Oral BID BM  . feeding supplement (OSMOLITE 1.5 CAL)  1,000 mL Per Tube Q24H  . feeding supplement (PRO-STAT SUGAR FREE 64)  30 mL Per Tube BID  . folic acid  1 mg Intravenous Daily  . mouth rinse  15 mL Mouth Rinse q12n4p  . pantoprazole (PROTONIX) IV  40 mg Intravenous Q12H  . QUEtiapine  100 mg Per J Tube QHS  . sodium chloride flush  10-40 mL Intracatheter Q12H  . thiamine injection  100 mg Intravenous Daily   Continuous Infusions: . sodium chloride 10 mL/hr at 09/20/19 1800   PRN Meds:.sodium chloride, acetaminophen, guaiFENesin, hydrocortisone cream, ipratropium-albuterol, LORazepam, ondansetron (ZOFRAN) IV, oxyCODONE, sodium chloride flush, traMADol  Xrays No results found.  Assessment/Plan: S/P Procedure(s) (LRB): ESOPHAGOGASTRODUODENOSCOPY (EGD) WITH PROPOFOL (N/A) BALLOON DILATION (N/A)  1 stable overall with ongoing intermit dysphagia on current diet. He believes GI is doing another dilation on Monday or Tuesday. I told him to discuss fecal incontinence with them as well 2 conts TX/Rx- current 3 Hopefully will make progress with placement this week  LOS: 34 days    John Giovanni [facility] 10/07/2019 Pager [phone]

## 2019-10-08 LAB — GLUCOSE, CAPILLARY
Glucose-Capillary: 116 mg/dL — ABNORMAL HIGH (ref 70–99)
Glucose-Capillary: 100 mg/dL — ABNORMAL HIGH (ref 70–99)

## 2019-10-08 NOTE — Progress Notes (Signed)
PT Cancellation Note  Patient Details Name: George Wade. MRN: [id number] DOB: July 27, 1956   Cancelled Treatment:    Reason Eval/Treat Not Completed: Patient declined, no reason specified  Patient declined participating in therapy at this time and reports having been up with nursing staff a lot this am. Pt prefers PT check back tomorrow vs this pm. PT will continue to follow acutely.    Earney Navy, PTA Acute Rehabilitation Services Pager: [phone] Office: [phone]   10/08/2019, 11:31 AM

## 2019-10-08 NOTE — Progress Notes (Signed)
[facility] Progress Note  Subjective: Patient seen today.  States he is still having problems swallowing but it depends on what he eats.  He ate eggs for breakfast and they went down okay but then when he ate lunch he ate ham and bread and it stuck in his esophagus.  He feels like he needs another dilation.  He was last dilated to 11 mm.  This was about a week ago.  Since he is still in the hospital we were asked to see him again about repeat dilation.  He is on Eliquis and this will have to be held a couple of days before dilation.  A second problem that he brought up today which he states he really needed help with was that he has discomfort in the anal area with protrusion of masses which have been there about 3 months according to him and are getting larger and larger.  He was told that these were either the worst case of hemorrhoids anyone had ever seen or anal warts.  He has an appointment to see Dr. Leighton Ruff in January but he is having so many problems he would like to get it addressed now.  It is difficult for him to keep himself clean stool leaks all the time and people comment on how bad it smells.  He states that his ex-wife had venereal warts.  Objective: Vital signs in last 24 hours: Temp:  [97.5 F (36.4 C)-98.5 F (36.9 C)] 98.5 F (36.9 C) (12/28 1214) Pulse Rate:  [94-105] 104 (12/28 1214) Resp:  [15-26] 15 (12/28 1214) BP: (89-132)/(77-96) 123/96 (12/28 1214) SpO2:  [97 %-99 %] 97 % (12/28 1214) Weight:  [70.3 kg] 70.3 kg (12/28 0554) Weight change: 0.7 kg   PE:  He is in no distress  Examination of the anal and perianal area revealed huge fungating multilobulated masses.  Lab Results: Results for orders placed or performed during the hospital encounter of 09/03/19 (from the past 24 hour(s))  Glucose, capillary     Status: None   Collection Time: 10/07/19  4:37 PM  Result Value Ref Range   Glucose-Capillary 95 70 - 99 mg/dL   Comment 1 Notify RN    Comment 2 Document in Chart   Glucose, capillary     Status: Abnormal   Collection Time: 10/07/19  7:59 PM  Result Value Ref Range   Glucose-Capillary 129 (H) 70 - 99 mg/dL  Glucose, capillary     Status: Abnormal   Collection Time: 10/07/19 11:35 PM  Result Value Ref Range   Glucose-Capillary 114 (H) 70 - 99 mg/dL   Comment 1 Notify RN    Comment 2 Document in Chart   Glucose, capillary     Status: Abnormal   Collection Time: 10/08/19  5:57 AM  Result Value Ref Range   Glucose-Capillary 116 (H) 70 - 99 mg/dL   Comment 1 Notify RN    Comment 2 Document in Chart   Glucose, capillary     Status: Abnormal   Collection Time: 10/08/19 11:00 AM  Result Value Ref Range   Glucose-Capillary 100 (H) 70 - 99 mg/dL    Studies/Results: No results found.    Assessment: Esophageal stricture  Anal masses.  Rule out condyloma versus carcinoma  Plan:   We will plan repeat endoscopy with dilatation but we will have to hold Eliquis.  We will asked surgery to see him while here in regards to the large anal masses.    SAM Hinda Lenis  10/08/2019, 2:57 PM  Pager: [phone] If no answer or after 5 PM call [phone]

## 2019-10-08 NOTE — Progress Notes (Addendum)
      [address]       [place] [address]             [phone]      7 Days Post-Op Procedure(s) (LRB): ESOPHAGOGASTRODUODENOSCOPY (EGD) WITH PROPOFOL (N/A) BALLOON DILATION (N/A) Subjective: Continues to have some intermittent dysphagia but he is seeing some improvement as he learns which foods to avoid. Pain is better. He is concerned about persistent incontinence of stool.  He continues to receive TF at night.   Objective: Vital signs in last 24 hours: Temp:  [97.5 F (36.4 C)-98.4 F (36.9 C)] 98 F (36.7 C) (12/28 0748) Pulse Rate:  [94-105] 100 (12/28 0748) Cardiac Rhythm: Sinus tachycardia (12/28 0700) Resp:  [18-30] 20 (12/28 0748) BP: (89-132)/(77-94) 124/91 (12/28 0748) SpO2:  [98 %-99 %] 98 % (12/28 0748) Weight:  [70.3 kg] 70.3 kg (12/28 0554)    Intake/Output from previous day: 12/27 0701 - 12/28 0700 In: 660 [P.O.:640; I.V.:20] Out: 3550 [Urine:3550] Intake/Output this shift: No intake/output data recorded.  General appearance: alert, cooperative and no distress Heart: regular rate and rhythm, mild ST at times.  Lungs: clear to auscultation bilaterally Abdomen: benign Extremities: no edema Wound: right chest incision and CT incisions are well healed.   Lab Results: No results for input(s): WBC, HGB, HCT, PLT in the last 72 hours. BMET: No results for input(s): NA, K, CL, CO2, GLUCOSE, BUN, CREATININE, CALCIUM in the last 72 hours.  PT/INR: No results for input(s): LABPROT, INR in the last 72 hours. ABG    Component Value Date/Time   PHART 7.432 09/14/2019 1405   HCO3 24.1 09/14/2019 1405   TCO2 28 09/04/2019 1241   ACIDBASEDEF 6.0 (H) 09/04/2019 0522   O2SAT 95.4 09/14/2019 1405   CBG (last 3)  Recent Labs    10/07/19 1959 10/07/19 2335 10/08/19 0557  GLUCAP 129* 114* 116*    Assessment/Plan: S/P Procedure(s) (LRB): ESOPHAGOGASTRODUODENOSCOPY (EGD) WITH PROPOFOL (N/A) BALLOON DILATION (N/A)  -POD-34right  thoracotomy, repair of esophogeal perforation with intercostal muscle flap. -POD-12EGD with dilation of distal esophageal stricture to 44mm . -POD-7repeat EGD with dilation of distal esophageal stricture to 65mm. Dr. Watt Climes recommends repeat EGD, possible dilation.  Will alert GI service that he remains an inpatient so this can be accomplished while he is here.   ToleratingPO soft diet. Stopped day-time TF but continuing to infuse at night.  -Incontinence- Will request GI sevice to evaluate  -CM team is working on disposition. No new recommendations made over the Holidays.   -H/OEtOH abuse-Calm and cooperative.Continue Seroquel  -Mild anemia- Hct remains stable.Monitor.  -Hypokalemia-Resolved.Monitor  -Glucose stable, no SSI coverage required in several days. D/C glucometers.   -H/O PE prior to surgery. Lovenox converted to Eliquis per pharmacy recommendations.   LOS: 35 days    George Wade, [place] [phone] 10/08/2019   Agree with above On intermittent tube feeds.  Diarrhea is worst with TF.  Will discuss possibly changing formula Will discuss repeat EGD with GI dispo George Wade

## 2019-10-08 NOTE — Progress Notes (Signed)
Collaborated with LCSW on next steps that would be helpful for [name]. Social worker and [place] how we might be able to get George Wade's laptop from the hotel where he was when he initially fell ill.  Spoke with clerk at hotel who confirmed the laptop was there.  Because of Covid restrictions, Chaplain procured permission from [facility] Unit Director to have laptop delivered to [name].  Arranged for volunteer from [employer] to go to [place] to get the laptop.  Hotel requires letter and proof of ID to release laptop.  Shared all this information with George Wade.  Will follow up with George Wade in the morning to assist him with supplies so he can write the letter.  Will coordinate pick up of laptop and cell phone between volunteer and motel clerk.   De Burrs Chaplain Resident

## 2019-10-09 LAB — GLUCOSE, CAPILLARY: Glucose-Capillary: 121 mg/dL — ABNORMAL HIGH (ref 70–99)

## 2019-10-09 LAB — CBC
HCT: 28.4 % — ABNORMAL LOW (ref 39.0–52.0)
Hemoglobin: 8.5 g/dL — ABNORMAL LOW (ref 13.0–17.0)
MCH: 25.7 pg — ABNORMAL LOW (ref 26.0–34.0)
MCHC: 29.9 g/dL — ABNORMAL LOW (ref 30.0–36.0)
MCV: 85.8 fL (ref 80.0–100.0)
Platelets: 291 10*3/uL (ref 150–400)
RBC: 3.31 MIL/uL — ABNORMAL LOW (ref 4.22–5.81)
RDW: 15.9 % — ABNORMAL HIGH (ref 11.5–15.5)
WBC: 5.2 10*3/uL (ref 4.0–10.5)
nRBC: 0 % (ref 0.0–0.2)

## 2019-10-09 LAB — BASIC METABOLIC PANEL
Anion gap: 8 (ref 5–15)
BUN: 12 mg/dL (ref 8–23)
CO2: 27 mmol/L (ref 22–32)
Calcium: 9 mg/dL (ref 8.9–10.3)
Chloride: 102 mmol/L (ref 98–111)
Creatinine, Ser: 0.47 mg/dL — ABNORMAL LOW (ref 0.61–1.24)
GFR calc Af Amer: >60 mL/min (ref >60)
GFR calc non Af Amer: >60 mL/min (ref >60)
Glucose, Bld: 121 mg/dL — ABNORMAL HIGH (ref 70–99)
Potassium: 4 mmol/L (ref 3.5–5.1)
Sodium: 137 mmol/L (ref 135–145)

## 2019-10-09 NOTE — Progress Notes (Signed)
I have held his Eliquis today and tomorrow with plans of upper endoscopy with esophageal dilation on Thursday.

## 2019-10-09 NOTE — Progress Notes (Addendum)
8 Days Post-Op Procedure(s) (LRB): ESOPHAGOGASTRODUODENOSCOPY (EGD) WITH PROPOFOL (N/A) BALLOON DILATION (N/A) Subjective: No new issues, tolerance for PO's about the same yesterday. His primary concern remains his incontinence and rectal mass.   Appreciate input from GI service. Repeat dilation of distal esophageal stricture planned for later this week.  Objective: Vital signs in last 24 hours: Temp:  [98.2 F (36.8 C)-98.7 F (37.1 C)] 98.4 F (36.9 C) (12/29 0812) Pulse Rate:  [87-104] 92 (12/29 0812) Cardiac Rhythm: Normal sinus rhythm (12/29 0700) Resp:  [15-20] 20 (12/29 0812) BP: (92-123)/(65-96) 111/86 (12/29 0812) SpO2:  [97 %-99 %] 97 % (12/29 0812) Weight:  [70.8 kg] 70.8 kg (12/29 0352)    Intake/Output from previous day: 12/28 0701 - 12/29 0700 In: 240 [P.O.:240] Out: 2702 [Urine:2701; Stool:1] Intake/Output this shift: No intake/output data recorded.  Physical Exam General appearance:alert, cooperative and no distress Heart:regular rate and rhythm Lungs:breath sounds clear Abdomen:benign Extremities:no edema Wound: right chest incision is well healed. One of the chest tube sites has separated and has some bloody drainage from the skin edges.   Lab Results: Recent Labs    10/09/19 0400  WBC 5.2  HGB 8.5*  HCT 28.4*  PLT 291   BMET:  Recent Labs    10/09/19 0400  NA 137  K 4.0  CL 102  CO2 27  GLUCOSE 121*  BUN 12  CREATININE 0.47*  CALCIUM 9.0    PT/INR: No results for input(s): LABPROT, INR in the last 72 hours. ABG    Component Value Date/Time   PHART 7.432 09/14/2019 1405   HCO3 24.1 09/14/2019 1405   TCO2 28 09/04/2019 1241   ACIDBASEDEF 6.0 (H) 09/04/2019 0522   O2SAT 95.4 09/14/2019 1405   CBG (last 3)  Recent Labs    10/07/19 2335 10/08/19 0557 10/08/19 1100  GLUCAP 114* 116* 100*    Assessment/Plan: S/P Procedure(s) (LRB): ESOPHAGOGASTRODUODENOSCOPY (EGD) WITH PROPOFOL (N/A) BALLOON DILATION  (N/A)  -POD-35right thoracotomy, repair of esophogeal perforation with intercostal muscle flap. -POD-13EGD with dilation of distal esophageal stricture to 37mm . -POD-8repeat EGD with dilation of distal esophageal stricture to 71mm. Dr. Watt Climes recommended repeat EGD, possible dilation.  Appreciate evaluation by Dr. Winnifred Friar yesterday. Eliquis on hold for possible EGD / dilation later this week.  ToleratingPO soft diet. Stopped day-time TF but continuing to infuse at night.  -Incontinence / rectal mass- this was apparently evaluated by surgery prior to our assuming care for the esophageal rupture and he was given an appointment for outpatient follow up with a colorectal surgeon  in [facility] but was unable to keep the appointment due to transportation issues. Expect he will have similar difficulties with transportation following this admission. Will ask general surgery to evaluate here.   -CM team is working on disposition. No new recommendations made over the Holidays.   -H/OEtOH abuse-Calm and cooperative.Continue Seroquel  -Mild anemia- Hctremainsstable.Monitor.  -Hypokalemia-Resolved.Monitor  -Glucose stable, no SSI coverage required in several days. D/C glucometers.   -H/O PE prior to surgery. Lovenox converted to Eliquis per pharmacy recommendations   LOS: 55 days    George Odea, PA-C [phone] patient examined and medical record reviewed,agree with above note. George Wade 10/09/2019

## 2019-10-09 NOTE — Progress Notes (Signed)
Follow up visit with George Wade to see his laptop which had been picked up by a volunteer from [employer] who went to the motel in [place] where it was being held in [place] and delivered it to the hospital.  Having his computer gives George Wade a feeling of having his life back.  He expresses it is offering him an opportunity to have some control over his life. Plan to continue to visit while he is here.  De Burrs Chaplain Resident

## 2019-10-09 NOTE — Progress Notes (Signed)
PT Cancellation Note  Patient Details Name: George Wade. MRN: [id number] DOB: 03-29-1956   Cancelled Treatment:    Reason Eval/Treat Not Completed: Patient declined, no reason specified Patient declined participating in PT at this time and reports wanting to wait in room for chaplain to arrive with his belongings. PT will continue to follow acutely.    Earney Navy, PTA Acute Rehabilitation Services Pager: [phone] Office: [phone]   10/09/2019, 1:24 PM

## 2019-10-09 NOTE — Progress Notes (Signed)
Follow-up visit with George Wade.  Today we worked on him writing a [employer] manager to the [employer] in [place] where some of his belongings are being kept for him.  George Wade is especially anxious to get his [employer] and his cell phone.  [employer] requires George Wade to write a letter approving them to release the laptop to a named volunteer.  Volunteer from [employer] is going over this morning to pick up laptop.  Letter and Rick's [place] driver's license is sealed in an envelope on his bedside table awaiting the arrival of the volunteer.  De Burrs Chaplain Resident

## 2019-10-09 NOTE — Progress Notes (Signed)
Nutrition Follow-up  DOCUMENTATION CODES:   Non-severe (moderate) malnutrition in context of social or environmental circumstances  INTERVENTION:   Continue nocturnal tube feeding: - Osmolite 1.5 @ 75 ml/hr via J-tube to run for 12 hours from 1900 to 0700 daily - Pro-stat 30 ml BID  Nocturnal tube feeding regimen provides1550kcal, 86grams of protein, and 678ml of H2O (70% of kcal needs, 78% of protein needs).  - Ensure Enlive po BID, each supplement provides 350 kcal and 20 grams of protein  NUTRITION DIAGNOSIS:   Moderate Malnutrition related to social / environmental circumstances (homeless, ETOH abuse) as evidenced by moderate muscle depletion, moderate fat depletion, mild fat depletion.  Ongoing, being addressed via TF and supplements  GOAL:   Patient will meet greater than or equal to 90% of their needs  Progressing  MONITOR:   Weight trends, Labs, I & O's, TF tolerance, Skin  REASON FOR ASSESSMENT:   Malnutrition Screening Tool    ASSESSMENT:   Patient with PMH significant for ETOH abuse, non-hodgkin's lymphoma, tonsillar cancer, esophogeal stricture s/p PEG, and HTN. Presents this admission with esophageal perforation.  11/24 -rightthoracotomy, repair of perforation, extubated 11/25 - G-tube converted to G-J tube 11/29 - pt removed NG tube, G-J tube clogged 12/01 - G-J tube unclogged, swallow study showing residual distal esophageal stricture and no leak of contrast 12/02 - diet advanced to clear liquids 12/04 - NPO, G-J tube fell out, transferred to ICU for sepsis 12/05 - transferred back to progressive care, G-J tube replaced 12/08 - PICC line 12/10 - swallow study showing no esophageal leak and severe distal esophageal stricture 12/11 - diet advanced to sips of clear liquids 12/16 - s/p EGD with dilation 12/21 - s/p EGD with dilation 12/22 - soft diet  12/25 - Regular diet  Plan is for repeat dilation of distal esophageal stricture later this  week.  General surgery to evaluate pt for rectal mass.  Pt remains on nocturnal TF via J-tube.  Pt accepting ~50% of Ensure Enlive per [facility] documentation.  Weight slowly trending up over the last week.  Spoke with pt via phone call to room. Pt reports that he has definitely noticed improvement since his dilation last week and that he has been doing alright with solid foods. Pt notes plan for repeat dilation on Thursday and hopes for additional improvement in swallowing ability. Pt endorses drinking the Ensure Enlive shakes and is eager to have nighttime tube feeds d/c. RD encouraged PO intake and discussed plan to continue nocturnal tube feeds until pt is tolerating PO's well and meeting his needs.  Meal Completion: 20-80% x last 8 meals  Medications reviewed and include: Ensure Enlive BID, folic acid, protonix, thiamine  Labs reviewed: hemoglobin 8.5  UOP: 2701 ml x 24 hours  Diet Order:   Diet Order            Diet regular Room service appropriate? Yes; Fluid consistency: Thin  Diet effective 0500 tomorrow              EDUCATION NEEDS:   Not appropriate for education at this time  Skin:  Skin Assessment: Skin Integrity Issues: Skin Integrity Issues: Incisions: R chest  Last BM:  10/08/19  Height:   Ht Readings from Last 1 Encounters:  09/04/19 5\' 8"  (1.727 m)    Weight:   Wt Readings from Last 1 Encounters:  10/09/19 70.8 kg    Ideal Body Weight:  70 kg  BMI:  Body mass index is 23.72 kg/m.  Estimated Nutritional Needs:   Kcal:  2200-2400 kcal  Protein:  110-125 grams  Fluid:  >/= 2.2 L/day    Gaynell Face, MS, RD, LDN Inpatient Clinical Dietitian Pager: [phone] Weekend/After Hours: [phone]

## 2019-10-10 MED ORDER — SODIUM CHLORIDE 0.9 % IV SOLN: INTRAVENOUS | Status: DC

## 2019-10-10 NOTE — Progress Notes (Signed)
Physical Therapy Treatment Patient Details Name: George Wade. MRN: [id number] DOB: [birth year] Today's Date: 10/10/2019    History of Present Illness Pt is a 63 y.o. male admitted 09/03/19 with c/o R-side chest pain. CT showed R hydropneumothorax and extensive distal pneumomediastinum. Pt s/p R major thoracotomy for esophageal performation with intercostal muscle flap on 11/24. s/p EGD and balloon dilation 12/21.  PMH includes alcohol abuse, non-Hodgkin's lymphoma, tonsillar cancer, HTN, PE, esophageal stricture. Of note, recent admission 07/2019-08/2019 for chest pain and alcohol-induced mood disorder.    PT Comments    Patient continues to make progress toward PT goals and tolerated mobility well. Pt does continue to demonstrate generalized weakness and balance deficits and will continue to benefit from further skilled PT services to maximize independence and safety with mobility.     Follow Up Recommendations  SNF;Supervision for mobility/OOB     Equipment Recommendations  None recommended by PT    Recommendations for Other Services       Precautions / Restrictions Precautions Precautions: Fall Precaution Comments: PEG    Mobility  Bed Mobility Overal bed mobility: Modified Independent                Transfers Overall transfer level: Needs assistance Equipment used: None Transfers: Sit to/from Stand Sit to Stand: Supervision         General transfer comment: for safety  Ambulation/Gait Ambulation/Gait assistance: Min guard Gait Distance (Feet): 300 Feet Assistive device: None Gait Pattern/deviations: Step-through pattern;Drifts right/left     General Gait Details: mild SOB with mobility; min guard for safety given mild balance impairments; tendency to drift R and L but LOB during session   Stairs             Wheelchair Mobility    Modified Rankin (Stroke Patients Only)       Balance     Sitting balance-Leahy Scale: Good        Standing balance-Leahy Scale: Fair                              Cognition Arousal/Alertness: Awake/alert Behavior During Therapy: WFL for tasks assessed/performed Overall Cognitive Status: Impaired/Different from baseline Area of Impairment: Safety/judgement                         Safety/Judgement: Decreased awareness of deficits;Decreased awareness of safety     General Comments: pt voicing some concern about discharge disposition      Exercises      General Comments        Pertinent Vitals/Pain Pain Assessment: No/denies pain    Home Living                      Prior Function            PT Goals (current goals can now be found in the care plan section) Acute Rehab PT Goals Patient Stated Goal: to be able to eat all foods and get stronger Progress towards PT goals: Progressing toward goals    Frequency    Min 3X/week      PT Plan Current plan remains appropriate    Co-evaluation              AM-PAC PT "6 Clicks" Mobility   Outcome Measure  Help needed turning from your back to your side while in a flat bed without using bedrails?: None Help needed  moving from lying on your back to sitting on the side of a flat bed without using bedrails?: None Help needed moving to and from a bed to a chair (including a wheelchair)?: None Help needed standing up from a chair using your arms (e.g., wheelchair or bedside chair)?: None Help needed to walk in hospital room?: A Little Help needed climbing 3-5 steps with a railing? : A Little 6 Click Score: 22    End of Session   Activity Tolerance: Patient tolerated treatment well Patient left: in bed;with call bell/phone within reach Nurse Communication: Mobility status PT Visit Diagnosis: Other abnormalities of gait and mobility (R26.89);Muscle weakness (generalized) (M62.81);Pain Pain - Right/Left: Right     Time: 8257-4935 PT Time Calculation (min) (ACUTE ONLY): 15  min  Charges:  $Gait Training: 8-22 mins                     Earney Navy, PTA Acute Rehabilitation Services Pager: [phone] Office: [phone]     Darliss Cheney 10/10/2019, 1:38 PM

## 2019-10-10 NOTE — Progress Notes (Signed)
9 Days Post-Op Procedure(s) (LRB): ESOPHAGOGASTRODUODENOSCOPY (EGD) WITH PROPOFOL (N/A) BALLOON DILATION (N/A) Subjective: Says he was not able to swallow as well yesterday. Otherwise feels about the same. No new issues. He is still concerned about his rectal mass and constant leaking stool.   Objective: Vital signs in last 24 hours: Temp:  [97.7 F (36.5 C)-99.8 F (37.7 C)] 99.8 F (37.7 C) (12/30 0722) Pulse Rate:  [93-110] 93 (12/30 0722) Cardiac Rhythm: Normal sinus rhythm (12/30 0700) Resp:  [14-24] 18 (12/30 0722) BP: (91-124)/(67-97) 109/76 (12/30 0722) SpO2:  [96 %-100 %] 99 % (12/30 0722)    Intake/Output from previous day: 12/29 0701 - 12/30 0700 In: 360 [P.O.:360] Out: 3100 [Urine:3100] Intake/Output this shift: Total I/O In: 1380 [P.O.:480; NG/GT:900] Out: -   Physical Exam General appearance:alert, cooperative and no distress Heart:regular rate and rhythm Lungs:breath sounds clear Abdomen:benign Extremities:no edema, PICC insertion site is OK Wound:right chest incision is well healed.  Lab Results: Recent Labs    10/09/19 0400  WBC 5.2  HGB 8.5*  HCT 28.4*  PLT 291   BMET:  Recent Labs    10/09/19 0400  NA 137  K 4.0  CL 102  CO2 27  GLUCOSE 121*  BUN 12  CREATININE 0.47*  CALCIUM 9.0    PT/INR: No results for input(s): LABPROT, INR in the last 72 hours. ABG    Component Value Date/Time   PHART 7.432 09/14/2019 1405   HCO3 24.1 09/14/2019 1405   TCO2 28 09/04/2019 1241   ACIDBASEDEF 6.0 (H) 09/04/2019 0522   O2SAT 95.4 09/14/2019 1405   CBG (last 3)  Recent Labs    10/07/19 2335 10/08/19 0557 10/08/19 1100  GLUCAP 114* 116* 100*    Assessment/Plan: S/P Procedure(s) (LRB): ESOPHAGOGASTRODUODENOSCOPY (EGD) WITH PROPOFOL (N/A) BALLOON DILATION (N/A)  -POD-36right thoracotomy, repair of esophogeal perforation with intercostal muscle flap. -POD-14EGD with dilation of distal esophageal stricture to 7mm  . -POD-9repeat EGD with dilation of distal esophageal stricture to 65mm. Dr. Watt Climes recommended repeat EGD, possible dilation. Appreciate evaluation by Dr. Penelope Coop.  Eliquis on hold for possible EGD / dilation tomorow. Did not tolerate soft diet as well yesterday. Continuing to infuse TF via J-tube at night.  -Incontinence / rectal mass- this was apparently evaluated by surgery prior to our assuming care for the esophageal rupture and he was given an appointment for outpatient follow up with a colorectal surgeon  in [facility] but was unable to keep the appointment due to transportation issues. Expect he will have similar difficulties with transportation following this admission. We have asked general surgery to evaluate here.   -CM team is working on disposition.  -H/OEtOH abuse-Calm and cooperative.Continue Seroquel  -Mild anemia- Hctremainsstable.Monitor.  -Hypokalemia-Resolved.Monitor  -H/O PE prior to surgery.Lovenox converted to Eliquis per pharmacy recommendations   LOS: 37 days    [name], PA-C [phone] 10/10/2019

## 2019-10-10 NOTE — Progress Notes (Signed)
The patient was seen today and we are scheduling him for repeat EGD with esophageal dilatation tomorrow.  I went over the procedure with him and he understands.  He has had this several times before.  He wishes to proceed.

## 2019-10-10 NOTE — Progress Notes (Signed)
Occupational Therapy Treatment Patient Details Name: George Wade. MRN: [id number] DOB: 1956-06-28 Today's Date: 10/10/2019    History of present illness Pt is a 63 y.o. male admitted 09/03/19 with c/o R-side chest pain. CT showed R hydropneumothorax and extensive distal pneumomediastinum. Pt s/p R major thoracotomy for esophageal performation with intercostal muscle flap on 11/24. s/p EGD and balloon dilation 12/21.  PMH includes alcohol abuse, non-Hodgkin's lymphoma, tonsillar cancer, HTN, PE, esophageal stricture. Of note, recent admission 07/2019-08/2019 for chest pain and alcohol-induced mood disorder.   OT comments  Pt continues to progress well. Supervised as he walked about his room gathering his belongings he wanted closer to his chair and for grooming at sink. Donned socks and front opening gown with set up. Pt with concerns about leaving the hospital without support. Grateful to OT for changing his bed linens.   Follow Up Recommendations  SNF;Supervision/Assistance - 24 hour    Equipment Recommendations       Recommendations for Other Services      Precautions / Restrictions Precautions Precautions: Fall Precaution Comments: PEG       Mobility Bed Mobility Overal bed mobility: Modified Independent                Transfers Overall transfer level: Needs assistance Equipment used: None Transfers: Sit to/from Stand Sit to Stand: Supervision         General transfer comment: for safety    Balance     Sitting balance-Leahy Scale: Good       Standing balance-Leahy Scale: Fair                             ADL either performed or assessed with clinical judgement   ADL Overall ADL's : Needs assistance/impaired Eating/Feeding: Independent   Grooming: Oral care;Brushing hair;Standing;Supervision/safety           Upper Body Dressing : Set up;Sitting   Lower Body Dressing: Set up;Sitting/lateral leans Lower Body Dressing Details  (indicate cue type and reason): for socks             Functional mobility during ADLs: Supervision/safety       Vision       Perception     Praxis      Cognition Arousal/Alertness: Awake/alert Behavior During Therapy: WFL for tasks assessed/performed Overall Cognitive Status: Impaired/Different from baseline Area of Impairment: Safety/judgement                         Safety/Judgement: Decreased awareness of deficits;Decreased awareness of safety     General Comments: pt voicing some concern about discharge disposition        Exercises     Shoulder Instructions       General Comments      Pertinent Vitals/ Pain       Pain Assessment: No/denies pain  Home Living                                          Prior Functioning/Environment              Frequency  Min 2X/week        Progress Toward Goals  OT Goals(current goals can now be found in the care plan section)  Progress towards OT goals: Progressing toward goals  Acute Rehab OT Goals  Patient Stated Goal: to be able to eat all foods and get stronger OT Goal Formulation: With patient Time For Goal Achievement: 10/12/19 Potential to Achieve Goals: Good  Plan Discharge plan remains appropriate    Co-evaluation                 AM-PAC OT "6 Clicks" Daily Activity     Outcome Measure   Help from another person eating meals?: None Help from another person taking care of personal grooming?: A Little Help from another person toileting, which includes using toliet, bedpan, or urinal?: A Little Help from another person bathing (including washing, rinsing, drying)?: A Little Help from another person to put on and taking off regular upper body clothing?: None Help from another person to put on and taking off regular lower body clothing?: A Little 6 Click Score: 20    End of Session    OT Visit Diagnosis: Unsteadiness on feet (R26.81);Other abnormalities of  gait and mobility (R26.89);Muscle weakness (generalized) (M62.81)   Activity Tolerance Patient tolerated treatment well   Patient Left in chair   Nurse Communication          Time: 9371-6967 OT Time Calculation (min): 28 min  Charges: OT General Charges $OT Visit: 1 Visit OT Treatments $Self Care/Home Management : 23-37 mins  Nestor Lewandowsky, OTR/L Acute Rehabilitation Services Pager: [phone] Office: [phone]   Malka So 10/10/2019, 1:18 PM

## 2019-10-10 NOTE — Anesthesia Preprocedure Evaluation (Addendum)
Anesthesia Evaluation  Patient identified by MRN, date of birth, ID band Patient awake    Reviewed: Allergy & Precautions, H&P , NPO status , Patient's Chart, lab work & pertinent test results  Airway Mallampati: II  TM Distance: >3 FB Neck ROM: Full    Dental no notable dental hx. (+) Teeth Intact, Dental Advisory Given   Pulmonary asthma ,    Pulmonary exam normal breath sounds clear to auscultation       Cardiovascular hypertension, Pt. on medications Normal cardiovascular exam Rhythm:Regular Rate:Normal     Neuro/Psych negative neurological ROS  negative psych ROS   GI/Hepatic Neg liver ROS, GERD  Medicated,  Endo/Other  negative endocrine ROS  Renal/GU negative Renal ROS     Musculoskeletal negative musculoskeletal ROS (+)   Abdominal   Peds  Hematology   Anesthesia Other Findings   Reproductive/Obstetrics                            Anesthesia Physical Anesthesia Plan  ASA: II  Anesthesia Plan: MAC   Post-op Pain Management:    Induction: Intravenous  PONV Risk Score and Plan: 3 and Treatment may vary due to age or medical condition, Ondansetron and Dexamethasone  Airway Management Planned: Nasal Cannula and Natural Airway  Additional Equipment: None  Intra-op Plan:   Post-operative Plan:   Informed Consent: I have reviewed the patients History and Physical, chart, labs and discussed the procedure including the risks, benefits and alternatives for the proposed anesthesia with the patient or authorized representative who has indicated his/her understanding and acceptance.     Dental advisory given  Plan Discussed with:   Anesthesia Plan Comments:        Anesthesia Quick Evaluation

## 2019-10-11 ENCOUNTER — Inpatient Hospital Stay (HOSPITAL_COMMUNITY): Payer: Self-pay | Admitting: Anesthesiology

## 2019-10-11 ENCOUNTER — Encounter (HOSPITAL_COMMUNITY): Payer: Self-pay | Admitting: Thoracic Surgery (Cardiothoracic Vascular Surgery)

## 2019-10-11 ENCOUNTER — Encounter (HOSPITAL_COMMUNITY)
Disposition: A | Discharge status: Skilled Nursing Facility | Payer: Self-pay | Source: Other Acute Inpatient Hospital | Attending: Thoracic Surgery (Cardiothoracic Vascular Surgery)

## 2019-10-11 HISTORY — PX: ESOPHAGOGASTRODUODENOSCOPY (EGD) WITH PROPOFOL: SHX5813

## 2019-10-11 HISTORY — PX: BALLOON DILATION: SHX5330

## 2019-10-11 SURGERY — ESOPHAGOGASTRODUODENOSCOPY (EGD) WITH PROPOFOL
Anesthesia: Monitor Anesthesia Care

## 2019-10-11 MED ORDER — PROPOFOL 10 MG/ML IV BOLUS
INTRAVENOUS | Status: DC | PRN
  Administered 2019-10-11 (×2): 20 mg via INTRAVENOUS

## 2019-10-11 MED ORDER — LACTATED RINGERS IV SOLN: INTRAVENOUS | Status: DC | PRN

## 2019-10-11 MED ORDER — MIDAZOLAM HCL 2 MG/2ML IJ SOLN
INTRAMUSCULAR | Status: DC | PRN
  Administered 2019-10-11: 2 mg via INTRAVENOUS

## 2019-10-11 MED ORDER — PHENYLEPHRINE 40 MCG/ML (10ML) SYRINGE FOR IV PUSH (FOR BLOOD PRESSURE SUPPORT)
PREFILLED_SYRINGE | INTRAVENOUS | Status: DC | PRN
  Administered 2019-10-11: 120 ug via INTRAVENOUS

## 2019-10-11 MED ORDER — PROPOFOL 500 MG/50ML IV EMUL
INTRAVENOUS | Status: DC | PRN
  Administered 2019-10-11: 130 ug/kg/min via INTRAVENOUS

## 2019-10-11 SURGICAL SUPPLY — 14 items

## 2019-10-11 NOTE — Plan of Care (Signed)
  Problem: Education: Goal: Knowledge of General Education information will improve Description: Including pain rating scale, medication(s)/side effects and non-pharmacologic comfort measures Outcome: Progressing   Problem: Health Behavior/Discharge Planning: Goal: Ability to manage health-related needs will improve Outcome: Progressing   Problem: Clinical Measurements: Goal: Ability to maintain clinical measurements within normal limits will improve Outcome: Progressing Goal: Will remain free from infection Outcome: Progressing Goal: Diagnostic test results will improve Outcome: Progressing Goal: Respiratory complications will improve Outcome: Progressing Goal: Cardiovascular complication will be avoided Outcome: Progressing   Problem: Activity: Goal: Risk for activity intolerance will decrease Outcome: Progressing   Problem: Nutrition: Goal: Adequate nutrition will be maintained Outcome: Progressing   Problem: Coping: Goal: Level of anxiety will decrease Outcome: Progressing   Problem: Elimination: Goal: Will not experience complications related to bowel motility Outcome: Progressing Goal: Will not experience complications related to urinary retention Outcome: Progressing   Problem: Pain Managment: Goal: General experience of comfort will improve Outcome: Progressing   Problem: Safety: Goal: Ability to remain free from injury will improve Outcome: Progressing   Problem: Skin Integrity: Goal: Risk for impaired skin integrity will decrease Outcome: Progressing   Problem: Bowel/Gastric: Goal: Gastrointestinal status for postoperative course will improve Outcome: Progressing   Problem: Nutritional: Goal: Ability to achieve adequate nutritional intake will improve Outcome: Progressing   Problem: Clinical Measurements: Goal: Postoperative complications will be avoided or minimized Outcome: Progressing   Problem: Respiratory: Goal: Ability to maintain a  clear airway will improve Outcome: Progressing   Problem: Skin Integrity: Goal: Demonstration of wound healing without infection will improve Outcome: Progressing

## 2019-10-11 NOTE — H&P (Signed)
The patient presents to the endoscopy department today for upper endoscopy with esophageal dilatation due to an esophageal stricture.  Previous dilation was to 11 mm with a balloon dilator.  George Wade continues to have solid food dysphagia.  Physical:  Alert and no distress  Heart regular rhythm no murmurs  Lungs clear  Abdomen soft and nontender  Impression: Esophageal stricture  Plan upper endoscopy with dilatation.  Procedure discussed in detail with patient along with potential risks of bleeding, infection, perforation.  George Wade understands and wishes to proceed.

## 2019-10-11 NOTE — Transfer of Care (Signed)
Immediate Anesthesia Transfer of Care Note  Patient: George Wade.  Procedure(s) Performed: ESOPHAGOGASTRODUODENOSCOPY (EGD) WITH PROPOFOL with dilation (N/A ) BALLOON DILATION (N/A )  Patient Location: Endoscopy Unit  Anesthesia Type:MAC  Level of Consciousness: drowsy and responds to stimulation  Airway & Oxygen Therapy: Patient Spontanous Breathing  Post-op Assessment: Report given to RN and Post -op Vital signs reviewed and stable  Post vital signs: Reviewed and stable  Last Vitals:  Vitals Value Taken Time  BP    Temp    Pulse 91 10/11/19 0939  Resp 22 10/11/19 0939  SpO2 94 % 10/11/19 0939  Vitals shown include unvalidated device data.  Last Pain:  Vitals:   10/11/19 0836  TempSrc: Oral  PainSc: 0-No pain      Patients Stated Pain Goal: 2 (83/01/41 5973)  Complications: No apparent anesthesia complications

## 2019-10-11 NOTE — Anesthesia Procedure Notes (Signed)
Procedure Name: MAC Date/Time: 10/11/2019 9:15 AM Performed by: Janace Litten, CRNA Pre-anesthesia Checklist: Patient identified, Emergency Drugs available, Suction available and Patient being monitored Patient Re-evaluated:Patient Re-evaluated prior to induction Oxygen Delivery Method: Nasal cannula

## 2019-10-11 NOTE — Anesthesia Postprocedure Evaluation (Signed)
Anesthesia Post Note  Patient: George Wade.  Procedure(s) Performed: ESOPHAGOGASTRODUODENOSCOPY (EGD) WITH PROPOFOL with dilation (N/A ) BALLOON DILATION (N/A )     Patient location during evaluation: Endoscopy Anesthesia Type: MAC Level of consciousness: awake and alert Pain management: pain level controlled Vital Signs Assessment: post-procedure vital signs reviewed and stable Respiratory status: spontaneous breathing, nonlabored ventilation, respiratory function stable and patient connected to nasal cannula oxygen Cardiovascular status: blood pressure returned to baseline and stable Postop Assessment: no apparent nausea or vomiting Anesthetic complications: no    Last Vitals:  Vitals:   10/11/19 1000 10/11/19 1114  BP: 102/75 108/88  Pulse: 84 82  Resp: 16 18  Temp:  (!) 36.4 C  SpO2: 98% 100%    Last Pain:  Vitals:   10/11/19 1142  TempSrc:   PainSc: 4                  Barnet Glasgow

## 2019-10-11 NOTE — Progress Notes (Signed)
Day of Surgery Procedure(s) (LRB): ESOPHAGOGASTRODUODENOSCOPY (EGD) WITH PROPOFOL with dilation (N/A) Subjective: Anxious about dilation later this morning  Objective: Vital signs in last 24 hours: Temp:  [97.4 F (36.3 C)-99 F (37.2 C)] 99 F (37.2 C) (12/31 0836) Pulse Rate:  [87-107] 87 (12/31 0836) Cardiac Rhythm: Sinus tachycardia (12/31 0743) Resp:  [13-26] 22 (12/31 0836) BP: (102-123)/(70-91) 102/70 (12/31 0836) SpO2:  [95 %-100 %] 100 % (12/31 0836) Weight:  [70.3 kg] 70.3 kg (12/31 0500)  Hemodynamic parameters for last 24 hours:    Intake/Output from previous day: 12/30 0701 - 12/31 0700 In: 2314.5 [P.O.:960; I.V.:144.5; NG/GT:1210] Out: 3950 [Urine:3950] Intake/Output this shift: No intake/output data recorded.  General appearance: alert, cooperative and no distress Neurologic: intact Heart: regular rate and rhythm Lungs: clear to auscultation bilaterally Abdomen: soft, tube in place  Lab Results: Recent Labs    10/09/19 0400  WBC 5.2  HGB 8.5*  HCT 28.4*  PLT 291   BMET:  Recent Labs    10/09/19 0400  NA 137  K 4.0  CL 102  CO2 27  GLUCOSE 121*  BUN 12  CREATININE 0.47*  CALCIUM 9.0    PT/INR: No results for input(s): LABPROT, INR in the last 72 hours. ABG    Component Value Date/Time   PHART 7.432 09/14/2019 1405   HCO3 24.1 09/14/2019 1405   TCO2 28 09/04/2019 1241   ACIDBASEDEF 6.0 (H) 09/04/2019 0522   O2SAT 95.4 09/14/2019 1405   CBG (last 3)  Recent Labs    10/08/19 1100  GLUCAP 100*    Assessment/Plan: S/P Procedure(s) (LRB): ESOPHAGOGASTRODUODENOSCOPY (EGD) WITH PROPOFOL with dilation (N/A) -For repeat esophageal dilatation today Has appointment in [facility] alter this month re: anal condylomata On Eliquis for PE Dc planning in progress   LOS: 38 days    Melrose Nakayama 10/11/2019

## 2019-10-11 NOTE — Op Note (Signed)
[facility] Patient Name: George Wade Procedure Date : 10/11/2019 MRN: [id number] Attending MD: Wonda Horner , MD Date of Birth: 28-Sep-1956 CSN: [id number] Age: 63 Admit Type: Inpatient Procedure:                Upper GI endoscopy Indications:              Dysphagia/known esophageal stricture. Previously                            dilated to 49mm. Providers:                Wonda Horner, MD, Elmer Ramp. Tilden Dome, RN, Lazaro Arms, Technician Referring MD:              Medicines:                Propofol per Anesthesia Complications:            No immediate complications. Estimated Blood Loss:     Estimated blood loss was minimal. Procedure:                Pre-Anesthesia Assessment:                           - Prior to the procedure, a History and Physical                            was performed, and patient medications and                            allergies were reviewed. The patient's tolerance of                            previous anesthesia was also reviewed. The risks                            and benefits of the procedure and the sedation                            options and risks were discussed with the patient.                            All questions were answered, and informed consent                            was obtained. Prior Anticoagulants: The patient has                            taken Eliquis (apixaban), last dose was 2 days                            prior to procedure. ASA Grade Assessment: III - A  patient with severe systemic disease. After                            reviewing the risks and benefits, the patient was                            deemed in satisfactory condition to undergo the                            procedure.                           After obtaining informed consent, the endoscope was                            passed under direct vision. Throughout the             procedure, the patient's blood pressure, pulse, and                            oxygen saturations were monitored continuously. The                            GIF-H190 ([id number]) Olympus gastroscope was                            introduced through the mouth, and advanced to the                            duodenal bulb. The upper GI endoscopy was                            accomplished without difficulty. The patient                            tolerated the procedure well. Scope In: Scope Out: Findings:      One moderate stenosis was found 33 to 34 cm from the incisors. The       surrounding mucosa was ulcerated. I could get the scope into but not       through the stenosis. The stenosis was traversed after dilation. A TTS       dilator was passed through the scope. Dilation with a 07-22-11 mm       balloon dilator was performed to 12 mm. The dilation site was examined       and showed moderate improvement in luminal narrowing. Estimated blood       loss was minimal.      There was evidence of a gastrostomy present in the gastric body. There       was a jejunal tube.      The examined duodenum was normal. Impression:               - Esophageal stenosis. Dilated.                           - Gastrostomy present.                           -  Normal examined duodenum.                           - No specimens collected. Recommendation:           - Clear liquid diet.                           - Continue present medications. Procedure Code(s):        --- Professional ---                           [phone], Esophagogastroduodenoscopy, flexible,                            transoral; with transendoscopic balloon dilation of                            esophagus (less than 30 mm diameter) Diagnosis Code(s):        --- Professional ---                           K22.2, Esophageal obstruction                           Z93.1, Gastrostomy status                           R13.10, Dysphagia,  unspecified CPT copyright 2019 American Medical Association. All rights reserved. The codes documented in this report are preliminary and upon coder review may  be revised to meet current compliance requirements. Wonda Horner, MD 10/11/2019 9:50:25 AM This report has been signed electronically. Number of Addenda: 0

## 2019-10-12 NOTE — Progress Notes (Signed)
Subjective: --Would like to try more of a diet; is tolerating clear liquids. --Some chest and subcostal discomfort, mild, after esophageal dilatation, which is what patient tells me has happened after each dilatation.  Objective: Vital signs in last 24 hours: Temp:  [97.6 F (36.4 C)-98.3 F (36.8 C)] 97.9 F (36.6 C) (01/01 0749) Pulse Rate:  [87-103] 87 (01/01 0749) Resp:  [15-26] 19 (01/01 0749) BP: (86-118)/(68-89) 94/78 (01/01 0749) SpO2:  [97 %-100 %] 98 % (01/01 0749) Weight:  [70.9 kg] 70.9 kg (01/01 0547) Weight change: 0.6 kg Last BM Date: 10/10/19  PE: GEN:  NAD  Lab Results: No results found for this or any previous visit (from the past 22 hour(s)).  Studies/Results: No results found.  Assessment:  1.  Boerhaave's syndrome s/p surgery. 2.  Esophageal stricture, dilatation yesterday to 12 mm. 3.  Anorectal lesion.  Plan:  1.  Trial of full liquid diet. 2.  Patient would like to see surgery to get his anorectal lesion looked at; he reports that this lesion results in him sitting in his feces constantly; he is also homeless so the odds of him getting successful outpatient follow up for this is relatively low.  Based on all these factors, I will ask general surgery to see patient tomorrow in consultation (per notes 10/08/19 Dr. Penelope Coop, surgery was going to be asked to see patient, but I don't see a surgical note in the chart). 3.  [facility] will follow.   George Wade 10/12/2019, 11:41 AM   Cell [phone] If no answer or after 5 PM call [phone]
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
## 2019-10-12 NOTE — Progress Notes (Addendum)
1 Day Post-Op Procedure(s) (LRB): ESOPHAGOGASTRODUODENOSCOPY (EGD) WITH PROPOFOL with dilation (N/A) BALLOON DILATION (N/A) Subjective: Awake and alert. No new complaints or issues. He is tolerating clear liquids without difficulty and says he is hungry and would like to try advancing his diet.   Objective: Vital signs in last 24 hours: Temp:  [97.5 F (36.4 C)-98.3 F (36.8 C)] 97.9 F (36.6 C) (01/01 0749) Pulse Rate:  [82-103] 87 (01/01 0749) Cardiac Rhythm: Normal sinus rhythm (01/01 0010) Resp:  [15-26] 21 (01/01 0749) BP: (86-118)/(68-89) 94/78 (01/01 0749) SpO2:  [95 %-100 %] 98 % (01/01 0749) Weight:  [70.9 kg] 70.9 kg (01/01 0547)   Intake/Output from previous day: 12/31 0701 - 01/01 0700 In: 1697.5 [P.O.:720; I.V.:200; NG/GT:777.5] Out: 3250 [Urine:3250] Intake/Output this shift: No intake/output data recorded.  Physical Exam: General appearance: alert, cooperative and no distress Neurologic: intact Heart: regular rate and rhythm Lungs: clear to auscultation bilaterally Abdomen: soft, tube in place, site is OK.  Lab Results: No results for input(s): WBC, HGB, HCT, PLT in the last 72 hours. BMET: No results for input(s): NA, K, CL, CO2, GLUCOSE, BUN, CREATININE, CALCIUM in the last 72 hours.  PT/INR: No results for input(s): LABPROT, INR in the last 72 hours. ABG    Component Value Date/Time   PHART 7.432 09/14/2019 1405   HCO3 24.1 09/14/2019 1405   TCO2 28 09/04/2019 1241   ACIDBASEDEF 6.0 (H) 09/04/2019 0522   O2SAT 95.4 09/14/2019 1405   CBG (last 3)  No results for input(s): GLUCAP in the last 72 hours.  Assessment/Plan: S/P Procedure(s) (LRB): ESOPHAGOGASTRODUODENOSCOPY (EGD) WITH PROPOFOL with dilation (N/A) BALLOON DILATION (N/A)  -POD-38right thoracotomy, repair of esophogeal perforation with intercostal muscle flap. -POD-16EGD with dilation of distal esophageal stricture to 73mm . -POD-11repeat EGD with dilation #2 of distal esophageal  stricture to 3mm. -POD-1 EGD with dilation #3 to 41mm by Dr. Penelope Coop He is tolerating clear liquids without difficulty. Will defer diet advancement to GI team.   Continuing to infuse TF via J-tube at night.  -Incontinence/ rectal mass- this was apparently evaluated by surgery prior to our assuming care for the esophageal rupture and he was given an appointment for outpatient follow up with a colorectal surgeon in [facility] but was unable to keep the appointment due to transportation issues. Expect he will have similar difficulties with transportation following this admission. General  surgery has declined to evaluate while an inpatient.   -CM team is working on disposition but no new information been documented over the holidays. .  -H/OEtOH abuse-Calm and cooperative.Continue Seroquel  -Mild anemia- Hctremainsstable.Monitor.  -Hypokalemia-Resolved.Monitor  -H/O PE prior to surgery.Continue Eliquis   LOS: 102 days    [name], PA-C [phone] 10/12/2019   I have seen and examined the patient and agree with the assessment and plan as outlined.  Rexene Alberts, MD 10/12/2019 9:49 AM

## 2019-10-12 NOTE — Plan of Care (Signed)
  Problem: Education: Goal: Knowledge of General Education information will improve Description: Including pain rating scale, medication(s)/side effects and non-pharmacologic comfort measures Outcome: Progressing   Problem: Health Behavior/Discharge Planning: Goal: Ability to manage health-related needs will improve Outcome: Progressing   Problem: Clinical Measurements: Goal: Ability to maintain clinical measurements within normal limits will improve Outcome: Progressing Goal: Will remain free from infection Outcome: Progressing Goal: Diagnostic test results will improve Outcome: Progressing Goal: Respiratory complications will improve Outcome: Progressing Goal: Cardiovascular complication will be avoided Outcome: Progressing   Problem: Activity: Goal: Risk for activity intolerance will decrease Outcome: Progressing   Problem: Nutrition: Goal: Adequate nutrition will be maintained Outcome: Progressing   Problem: Coping: Goal: Level of anxiety will decrease Outcome: Progressing   Problem: Elimination: Goal: Will not experience complications related to bowel motility Outcome: Progressing Goal: Will not experience complications related to urinary retention Outcome: Progressing   Problem: Pain Managment: Goal: General experience of comfort will improve Outcome: Progressing   Problem: Safety: Goal: Ability to remain free from injury will improve Outcome: Progressing   Problem: Skin Integrity: Goal: Risk for impaired skin integrity will decrease Outcome: Progressing   Problem: Bowel/Gastric: Goal: Gastrointestinal status for postoperative course will improve Outcome: Progressing   Problem: Nutritional: Goal: Ability to achieve adequate nutritional intake will improve Outcome: Progressing   Problem: Clinical Measurements: Goal: Postoperative complications will be avoided or minimized Outcome: Progressing   Problem: Respiratory: Goal: Ability to maintain a  clear airway will improve Outcome: Progressing   Problem: Skin Integrity: Goal: Demonstration of wound healing without infection will improve Outcome: Progressing

## 2019-10-13 LAB — CBC
HCT: 28.8 % — ABNORMAL LOW (ref 39.0–52.0)
Hemoglobin: 8.5 g/dL — ABNORMAL LOW (ref 13.0–17.0)
MCH: 24.9 pg — ABNORMAL LOW (ref 26.0–34.0)
MCHC: 29.5 g/dL — ABNORMAL LOW (ref 30.0–36.0)
MCV: 84.2 fL (ref 80.0–100.0)
Platelets: 325 10*3/uL (ref 150–400)
RBC: 3.42 MIL/uL — ABNORMAL LOW (ref 4.22–5.81)
RDW: 15.8 % — ABNORMAL HIGH (ref 11.5–15.5)
WBC: 5.6 10*3/uL (ref 4.0–10.5)
nRBC: 0 % (ref 0.0–0.2)

## 2019-10-13 LAB — BASIC METABOLIC PANEL
Anion gap: 10 (ref 5–15)
BUN: 13 mg/dL (ref 8–23)
CO2: 26 mmol/L (ref 22–32)
Calcium: 9.2 mg/dL (ref 8.9–10.3)
Chloride: 102 mmol/L (ref 98–111)
Creatinine, Ser: 0.46 mg/dL — ABNORMAL LOW (ref 0.61–1.24)
GFR calc Af Amer: >60 mL/min (ref >60)
GFR calc non Af Amer: >60 mL/min (ref >60)
Glucose, Bld: 129 mg/dL — ABNORMAL HIGH (ref 70–99)
Potassium: 4.2 mmol/L (ref 3.5–5.1)
Sodium: 138 mmol/L (ref 135–145)

## 2019-10-13 NOTE — Plan of Care (Signed)
  Problem: Education: Goal: Knowledge of General Education information will improve Description: Including pain rating scale, medication(s)/side effects and non-pharmacologic comfort measures Outcome: Progressing   Problem: Health Behavior/Discharge Planning: Goal: Ability to manage health-related needs will improve Outcome: Progressing   Problem: Clinical Measurements: Goal: Ability to maintain clinical measurements within normal limits will improve Outcome: Progressing Goal: Will remain free from infection Outcome: Progressing Goal: Diagnostic test results will improve Outcome: Progressing Goal: Respiratory complications will improve Outcome: Progressing Goal: Cardiovascular complication will be avoided Outcome: Progressing   Problem: Activity: Goal: Risk for activity intolerance will decrease Outcome: Progressing   Problem: Nutrition: Goal: Adequate nutrition will be maintained Outcome: Progressing   Problem: Coping: Goal: Level of anxiety will decrease Outcome: Progressing   Problem: Elimination: Goal: Will not experience complications related to bowel motility Outcome: Progressing Goal: Will not experience complications related to urinary retention Outcome: Progressing   Problem: Pain Managment: Goal: General experience of comfort will improve Outcome: Progressing   Problem: Safety: Goal: Ability to remain free from injury will improve Outcome: Progressing   Problem: Skin Integrity: Goal: Risk for impaired skin integrity will decrease Outcome: Progressing   Problem: Bowel/Gastric: Goal: Gastrointestinal status for postoperative course will improve Outcome: Progressing   Problem: Nutritional: Goal: Ability to achieve adequate nutritional intake will improve Outcome: Progressing   Problem: Clinical Measurements: Goal: Postoperative complications will be avoided or minimized Outcome: Progressing   Problem: Respiratory: Goal: Ability to maintain a  clear airway will improve Outcome: Progressing   Problem: Skin Integrity: Goal: Demonstration of wound healing without infection will improve Outcome: Progressing

## 2019-10-13 NOTE — Progress Notes (Signed)
Subjective: Some trouble with full liquids (grits), but went down eventually with small bites.  Objective: Vital signs in last 24 hours: Temp:  [97.4 F (36.3 C)-98.6 F (37 C)] 98 F (36.7 C) (01/02 1138) Pulse Rate:  [90-111] 94 (01/02 1138) Resp:  [16-22] 21 (01/02 1138) BP: (101-126)/(75-97) 101/88 (01/02 1138) SpO2:  [97 %-99 %] 98 % (01/02 1138) Weight:  [70.5 kg] 70.5 kg (01/02 0500) Weight change: -0.4 kg Last BM Date: 10/11/19  PE: GEN:  NAD  Lab Results: CBC    Component Value Date/Time   WBC 5.6 10/13/2019 0545   RBC 3.42 (L) 10/13/2019 0545   HGB 8.5 (L) 10/13/2019 0545   HCT 28.8 (L) 10/13/2019 0545   PLT 325 10/13/2019 0545   MCV 84.2 10/13/2019 0545   MCH 24.9 (L) 10/13/2019 0545   MCHC 29.5 (L) 10/13/2019 0545   RDW 15.8 (H) 10/13/2019 0545   LYMPHSABS 0.7 09/16/2019 0210   MONOABS 0.3 09/16/2019 0210   EOSABS 0.2 09/16/2019 0210   BASOSABS 0.0 09/16/2019 0210   Assessment:  1.  Boerhaave's syndrome s/p surgery. 2.  Esophageal stricture, dilatation yesterday to 12 mm. 3.  Anorectal lesion.  Plan:  1.  Full liquid diet only. Do not yet advance. 2.  If still unable to tolerate full liquid diet well and/or increase in diet to mechanical soft, patient will likely need repeat esophageal dilatation prior to discharge. 3.  Based on #1 and #2, it appears patient is not set for discharge from GI perspective for at least the next several days.  As a result, would defer general surgical consultation until Monday, but given multiple valid concerns raised by patient and in my note yesterday, would definitely do general surgical consultation for anorectal lesion as inpatient and prior to his hospital discharge. 4.  [facility] will revisit Monday.   Landry Dyke 10/13/2019, 11:44 AM   Cell [phone] If no answer or after 5 PM call [phone]

## 2019-10-13 NOTE — Progress Notes (Addendum)
2 Days Post-Op Procedure(s) (LRB): ESOPHAGOGASTRODUODENOSCOPY (EGD) WITH PROPOFOL with dilation (N/A) BALLOON DILATION (N/A) Subjective: Diet advanced to full liquids yesterday. Said he was able to consume about 1/3 of the food delivered to him and has been supplementing his intake with Boost and Ensure.  TF via J-tube continues at night.  Objective: Vital signs in last 24 hours: Temp:  [97.4 F (36.3 C)-98.6 F (37 C)] 98 F (36.7 C) (01/02 0744) Pulse Rate:  [90-111] 93 (01/02 0744) Cardiac Rhythm: Normal sinus rhythm (01/02 0500) Resp:  [16-22] 18 (01/02 0744) BP: (111-126)/(75-97) 117/88 (01/02 0744) SpO2:  [97 %-99 %] 99 % (01/02 0744) Weight:  [70.5 kg] 70.5 kg (01/02 0500)     Intake/Output from previous day: 01/01 0701 - 01/02 0700 In: 1267 [P.O.:477; I.V.:40; NG/GT:750] Out: 2075 [Urine:2075] Intake/Output this shift: Total I/O In: 240 [P.O.:240] Out: 200 [Urine:200]  Physical Exam: General appearance:alert, cooperative and no distress Neurologic:intact Heart:regular rate and rhythm Lungs:breath sounds clear  Abdomen:soft, tube in place, site is OK.  Lab Results: Recent Labs    10/13/19 0545  WBC 5.6  HGB 8.5*  HCT 28.8*  PLT 325   BMET:  Recent Labs    10/13/19 0545  NA 138  K 4.2  CL 102  CO2 26  GLUCOSE 129*  BUN 13  CREATININE 0.46*  CALCIUM 9.2    PT/INR: No results for input(s): LABPROT, INR in the last 72 hours. ABG    Component Value Date/Time   PHART 7.432 09/14/2019 1405   HCO3 24.1 09/14/2019 1405   TCO2 28 09/04/2019 1241   ACIDBASEDEF 6.0 (H) 09/04/2019 0522   O2SAT 95.4 09/14/2019 1405   CBG (last 3)  No results for input(s): GLUCAP in the last 72 hours.  Assessment/Plan: S/P Procedure(s) (LRB): ESOPHAGOGASTRODUODENOSCOPY (EGD) WITH PROPOFOL with dilation (N/A) BALLOON DILATION (N/A)  -POD-39right thoracotomy, repair of esophogeal perforation with intercostal muscle flap. -POD-17EGD with dilation of distal  esophageal stricture to 50mm . -POD-12repeat EGD with dilation #2 of distal esophageal stricture to 70mm. -POD-2 EGD with dilation #3 to 67mm by Dr. Penelope Coop He is tolerating but with some difficulty. He says he can control this to some extent by eating slowly and in small quaitities.  Will defer diet advancement to GI team.  Continuing to infuseTF via J-tubeat night.  -Incontinence/ rectal mass- this was apparently evaluated by surgery prior to our assuming care for the esophageal rupture and he was given an appointment for outpatient follow up with a colorectal surgeon in [facility] but was unable to keep the appointment due to transportation issues. Expect he will have similar difficulties with transportation following this admission.[facility] has declined our requests to evaluate while an inpatient. Appreciate efforts by GI service to get this accomplished.   -CM team is working on disposition but no new information been documented over the holidays. .  -H/OEtOH abuse-Calm and cooperative.Continue Seroquel  -Mild anemia- Hctremainsstable.Monitor.  -Hypokalemia-Resolved.Monitor   LOS: 40 days    [name], PA-C [phone] 10/13/2019   I have seen and examined the patient and agree with the assessment and plan as outlined.  Rexene Alberts, MD 10/13/2019 11:44 AM

## 2019-10-13 NOTE — TOC Progression Note (Signed)
Transition of Care (TOC) - Progression Note    Patient Details  Name: George Wade. MRN: [id number] Date of Birth: Nov 07, 1955  Transition of Care [facility]) CM/SW [place], [place] Phone Number: 10/13/2019, 10:04 AM  Clinical Narrative:    Pt still without any current placement offers. Barriers include lack of insurance and medical care needs that are ongoing. TOC team continues to seek LOG placement for pt.    Expected Discharge Plan: [place] Barriers to Discharge: Inadequate or no insurance, Continued Medical Work up  Expected Discharge Plan and Services Expected Discharge Plan: [place] arrangements for the past 2 months: Hotel/Motel    Social Determinants of Health (SDOH) Interventions    Readmission Risk Interventions No flowsheet data found.

## 2019-10-14 NOTE — Plan of Care (Signed)
  Problem: Education: Goal: Knowledge of General Education information will improve Description: Including pain rating scale, medication(s)/side effects and non-pharmacologic comfort measures Outcome: Progressing   Problem: Health Behavior/Discharge Planning: Goal: Ability to manage health-related needs will improve Outcome: Progressing   Problem: Clinical Measurements: Goal: Ability to maintain clinical measurements within normal limits will improve Outcome: Progressing Goal: Will remain free from infection Outcome: Progressing Goal: Diagnostic test results will improve Outcome: Progressing Goal: Respiratory complications will improve Outcome: Progressing Goal: Cardiovascular complication will be avoided Outcome: Progressing   Problem: Activity: Goal: Risk for activity intolerance will decrease Outcome: Progressing   Problem: Nutrition: Goal: Adequate nutrition will be maintained Outcome: Progressing   Problem: Coping: Goal: Level of anxiety will decrease Outcome: Progressing   Problem: Elimination: Goal: Will not experience complications related to bowel motility Outcome: Progressing Goal: Will not experience complications related to urinary retention Outcome: Progressing   Problem: Pain Managment: Goal: General experience of comfort will improve Outcome: Progressing   Problem: Safety: Goal: Ability to remain free from injury will improve Outcome: Progressing   Problem: Skin Integrity: Goal: Risk for impaired skin integrity will decrease Outcome: Progressing   Problem: Bowel/Gastric: Goal: Gastrointestinal status for postoperative course will improve Outcome: Progressing   Problem: Nutritional: Goal: Ability to achieve adequate nutritional intake will improve Outcome: Progressing   Problem: Clinical Measurements: Goal: Postoperative complications will be avoided or minimized Outcome: Progressing   Problem: Respiratory: Goal: Ability to maintain a  clear airway will improve Outcome: Progressing   Problem: Skin Integrity: Goal: Demonstration of wound healing without infection will improve Outcome: Progressing

## 2019-10-14 NOTE — Progress Notes (Addendum)
3 Days Post-Op Procedure(s) (LRB): ESOPHAGOGASTRODUODENOSCOPY (EGD) WITH PROPOFOL with dilation (N/A) BALLOON DILATION (N/A) Subjective: He is pleased he was able to eat an entire bowl of grits this morning. He says he is continuing to learn techniques that improve his ability to swallow successfully.   Objective: Vital signs in last 24 hours: Temp:  [98 F (36.7 C)-98.9 F (37.2 C)] 98.9 F (37.2 C) (01/03 0725) Pulse Rate:  [84-98] 98 (01/03 0725) Cardiac Rhythm: Normal sinus rhythm (01/03 0700) Resp:  [13-25] 17 (01/03 0725) BP: (101-123)/(76-89) 116/79 (01/03 0725) SpO2:  [97 %-98 %] 97 % (01/03 0725)     Intake/Output from previous day: 01/02 0701 - 01/03 0700 In: 1655 [P.O.:720; NG/GT:935] Out: 3625 [Urine:3625] Intake/Output this shift: Total I/O In: 240 [P.O.:240] Out: 200 [Urine:200]  Physical Exam: General appearance:alert, cooperative and no distress Neurologic:intact Heart:regular rate and rhythm  Lab Results: Recent Labs    10/13/19 0545  WBC 5.6  HGB 8.5*  HCT 28.8*  PLT 325   BMET:  Recent Labs    10/13/19 0545  NA 138  K 4.2  CL 102  CO2 26  GLUCOSE 129*  BUN 13  CREATININE 0.46*  CALCIUM 9.2    PT/INR: No results for input(s): LABPROT, INR in the last 72 hours. ABG    Component Value Date/Time   PHART 7.432 09/14/2019 1405   HCO3 24.1 09/14/2019 1405   TCO2 28 09/04/2019 1241   ACIDBASEDEF 6.0 (H) 09/04/2019 0522   O2SAT 95.4 09/14/2019 1405   CBG (last 3)  No results for input(s): GLUCAP in the last 72 hours.  Assessment/Plan: S/P Procedure(s) (LRB): ESOPHAGOGASTRODUODENOSCOPY (EGD) WITH PROPOFOL with dilation (N/A) BALLOON DILATION (N/A)  -POD-40right thoracotomy, repair of esophogeal perforation with intercostal muscle flap. -POD-18EGD with dilation of distal esophageal stricture to 64mm . -POD-13repeat EGD with dilation#2of distal esophageal stricture to 43mm. -POD-3 EGD with dilation #3 to 56mm by Dr.  Penelope Coop He is tolerating full liquid diet better today.   Will defer diet advancement to GI team.  Continuing to infuseTF via J-tubeat night.  -Incontinence/ rectal mass- this was apparently evaluated by surgery prior to our assuming care for the esophageal rupture and he was given an appointment for outpatient follow up with a colorectal surgeon in [facility] but was unable to keep the appointment due to transportation issues. Expect he will have similar difficulties with transportation following this admission.[facility] declined our requests to evaluate while an inpatient. Appreciate efforts by GI service to get this accomplished.   -CM team has been working on disposition. Currently no placement offers.  -H/OEtOH abuse-Calm and cooperative.Continue Seroquel  -Mild anemia- Hctremainsstable.Monitor.  -Hypokalemia-Resolved.Monitor   LOS: 41 days    [name], PA-C [phone] 10/14/2019   I have seen and examined the patient and agree with the assessment and plan as outlined.  Rexene Alberts, MD 10/14/2019 10:28 AM

## 2019-10-15 NOTE — Progress Notes (Signed)
Chaplain engaged in follow-up visit with George Wade. During visit, George Wade voiced his concerns about his future outside of the hospital and not having answers concerning his medical care.  Chaplain affirmed George Wade's needs concerning the care for his body and his future.    Chaplain will reach out to social work to have her discuss with George Wade in-person his future living arrangements.  Chaplain will continue to follow-up.

## 2019-10-15 NOTE — Progress Notes (Addendum)
4 Days Post-Op Procedure(s) (LRB): ESOPHAGOGASTRODUODENOSCOPY (EGD) WITH PROPOFOL with dilation (N/A) BALLOON DILATION (N/A) Subjective: Attempting to eat breakfast (full liquids) but having difficulty swallowing. Says he did much better yesterday.   Objective: Vital signs in last 24 hours: Temp:  [97.8 F (36.6 C)-98.2 F (36.8 C)] 98 F (36.7 C) (01/04 0726) Pulse Rate:  [87-97] 87 (01/04 0726) Cardiac Rhythm: Normal sinus rhythm (01/04 0700) Resp:  [14-20] 18 (01/04 0726) BP: (101-124)/(71-90) 124/74 (01/04 0726) SpO2:  [97 %-100 %] 97 % (01/04 0726) Weight:  [70.8 kg] 70.8 kg (01/04 0500)     Intake/Output from previous day: 01/03 0701 - 01/04 0700 In: 2755 [P.O.:880; NG/GT:1875] Out: 0355 [Urine:4225] Intake/Output this shift: No intake/output data recorded.  Physical Exam: General appearance:alert, cooperative and no distress Neurologic:intact Heart:regular rate and rhythm GI-G-J tube continues to function appropriately  Lab Results: Recent Labs    10/13/19 0545  WBC 5.6  HGB 8.5*  HCT 28.8*  PLT 325   BMET:  Recent Labs    10/13/19 0545  NA 138  K 4.2  CL 102  CO2 26  GLUCOSE 129*  BUN 13  CREATININE 0.46*  CALCIUM 9.2    PT/INR: No results for input(s): LABPROT, INR in the last 72 hours. ABG    Component Value Date/Time   PHART 7.432 09/14/2019 1405   HCO3 24.1 09/14/2019 1405   TCO2 28 09/04/2019 1241   ACIDBASEDEF 6.0 (H) 09/04/2019 0522   O2SAT 95.4 09/14/2019 1405   CBG (last 3)  No results for input(s): GLUCAP in the last 72 hours.  Assessment/Plan: S/P Procedure(s) (LRB): ESOPHAGOGASTRODUODENOSCOPY (EGD) WITH PROPOFOL with dilation (N/A) BALLOON DILATION (N/A)  -POD-41right thoracotomy, repair of esophogeal perforation with intercostal muscle flap. -POD-19EGD with dilation of distal esophageal stricture to 70mm . -POD-14repeat EGD with dilation#2of distal esophageal stricture to 75mm. -POD-4EGD with dilation #3 to  1mm by Dr. Scarlette Calico in 878ml PO yesterday but having more difficulty swallowing this morning. GI folllowing. Continuing to infuseTF via J-tubeat night.  -Incontinence/ rectal mass- this was apparently evaluated by surgery prior to our assuming care for the esophageal rupture and he was given an appointment for outpatient follow up with a colorectal surgeon in [facility] but was unable to keep the appointment due to transportation issues. Expect he will have similar difficulties with transportation following this admission.[facility] while an inpatient.Appreciate efforts by GI service to get this accomplished.  -CM team has been working on disposition. Currently no placement offers.  -H/OEtOH abuse-Calm and cooperative.Continue Seroquel  -Mild anemia- Hctremainsstable.Monitor.  -Hypokalemia-Resolved.Monitor   LOS: 42 days    Malon Kindle [phone] 10/15/2019 Patient seen and examined, agree with above For repeat ECG tomorrow  [name], MD [facility] [phone]

## 2019-10-15 NOTE — H&P (View-Only) (Signed)
Subjective: Complains of intermittent difficulty on full liquids, especially with grits, able to tolerate soup, juices, yogurt and oatmeal. Has more formed bowel movements but is very frustrated that the anal lesion cannot be removed inpatient.  Objective: Vital signs in last 24 hours: Temp:  [97.8 F (36.6 C)-98.2 F (36.8 C)] 98 F (36.7 C) (01/04 0726) Pulse Rate:  [87-97] 87 (01/04 0726) Resp:  [14-20] 18 (01/04 0726) BP: (101-124)/(71-90) 124/74 (01/04 0726) SpO2:  [97 %-100 %] 97 % (01/04 0726) Weight:  [70.8 kg] 70.8 kg (01/04 0500) Weight change:  Last BM Date: 10/14/19  PE: Sitting up on bed GENERAL: Not in distress ABDOMEN: feeding tube in place(used only at night) EXTREMITIES: No deformity  Lab Results: No results found for this or any previous visit (from the past 48 hour(s)).  Studies/Results: No results found.  Medications: I have reviewed the patient's current medications.  Assessment: Esophageal stricture, EGD with dilation performed up to 12 mm balloon on 10/11/2019  History of Boerhaave syndrome, status post thoracotomy and repair Normocytic anemia, hemoglobin 8.5  Anal lesion-recommend general surgical consultation for anorectal lesion as inpatient and prior to hospital discharge  Plan: N.p.o. post midnight for EGD in a.m. with possible balloon dilation   [name], MD 10/15/2019, 9:28 AM

## 2019-10-15 NOTE — Progress Notes (Signed)
Subjective: Complains of intermittent difficulty on full liquids, especially with grits, able to tolerate soup, juices, yogurt and oatmeal. Has more formed bowel movements but is very frustrated that the anal lesion cannot be removed inpatient.  Objective: Vital signs in last 24 hours: Temp:  [97.8 F (36.6 C)-98.2 F (36.8 C)] 98 F (36.7 C) (01/04 0726) Pulse Rate:  [87-97] 87 (01/04 0726) Resp:  [14-20] 18 (01/04 0726) BP: (101-124)/(71-90) 124/74 (01/04 0726) SpO2:  [97 %-100 %] 97 % (01/04 0726) Weight:  [70.8 kg] 70.8 kg (01/04 0500) Weight change:  Last BM Date: 10/14/19  PE: Sitting up on bed GENERAL: Not in distress ABDOMEN: feeding tube in place(used only at night) EXTREMITIES: No deformity  Lab Results: No results found for this or any previous visit (from the past 48 hour(s)).  Studies/Results: No results found.  Medications: I have reviewed the patient's current medications.  Assessment: Esophageal stricture, EGD with dilation performed up to 12 mm balloon on 10/11/2019  History of Boerhaave syndrome, status post thoracotomy and repair Normocytic anemia, hemoglobin 8.5  Anal lesion-recommend general surgical consultation for anorectal lesion as inpatient and prior to hospital discharge  Plan: N.p.o. post midnight for EGD in a.m. with possible balloon dilation   [name], MD 10/15/2019, 9:28 AM

## 2019-10-15 NOTE — Progress Notes (Signed)
Consent has been taken for dilation tomorrow. Pt is aware to be NPO from midnight. G-tube feeding will be held for tonight. Pt wanted to talk to the case manager, informed case Building services engineer via call. Pt is sitting in a recliner this time, Pain is covering with tramadol. Pt has a concern regarding his anal lesion needed to be surgically removed, and MD's are aware.  Will continue to monitor the patient  Palma Holter, RN

## 2019-10-16 ENCOUNTER — Encounter (HOSPITAL_COMMUNITY): Payer: Self-pay | Admitting: Thoracic Surgery (Cardiothoracic Vascular Surgery)

## 2019-10-16 ENCOUNTER — Encounter (HOSPITAL_COMMUNITY)
Disposition: A | Discharge status: Skilled Nursing Facility | Payer: Self-pay | Source: Other Acute Inpatient Hospital | Attending: Thoracic Surgery (Cardiothoracic Vascular Surgery)

## 2019-10-16 ENCOUNTER — Inpatient Hospital Stay (HOSPITAL_COMMUNITY): Payer: Self-pay | Admitting: Anesthesiology

## 2019-10-16 HISTORY — PX: BALLOON DILATION: SHX5330

## 2019-10-16 HISTORY — PX: ESOPHAGOGASTRODUODENOSCOPY (EGD) WITH PROPOFOL: SHX5813

## 2019-10-16 SURGERY — ESOPHAGOGASTRODUODENOSCOPY (EGD) WITH PROPOFOL
Anesthesia: Monitor Anesthesia Care

## 2019-10-16 MED ORDER — PHENYLEPHRINE HCL (PRESSORS) 10 MG/ML IV SOLN
INTRAVENOUS | Status: DC | PRN
  Administered 2019-10-16: 20 ug via INTRAVENOUS

## 2019-10-16 MED ORDER — PROPOFOL 10 MG/ML IV BOLUS
INTRAVENOUS | Status: DC | PRN
  Administered 2019-10-16: 20 mg via INTRAVENOUS

## 2019-10-16 MED ORDER — MIDAZOLAM HCL 5 MG/5ML IJ SOLN
INTRAMUSCULAR | Status: DC | PRN
  Administered 2019-10-16: 2 mg via INTRAVENOUS

## 2019-10-16 MED ORDER — SODIUM CHLORIDE 0.9 % IV SOLN: INTRAVENOUS | Status: DC

## 2019-10-16 MED ORDER — PROPOFOL 500 MG/50ML IV EMUL
INTRAVENOUS | Status: DC | PRN
  Administered 2019-10-16: 100 ug/kg/min via INTRAVENOUS

## 2019-10-16 MED ORDER — LACTATED RINGERS IV SOLN: INTRAVENOUS | Status: DC

## 2019-10-16 MED ORDER — LACTATED RINGERS IV SOLN: INTRAVENOUS | Status: DC | PRN

## 2019-10-16 MED ORDER — SODIUM CHLORIDE 0.9 % IV SOLN: INTRAVENOUS | Status: DC | PRN

## 2019-10-16 MED ORDER — SUCRALFATE 1 GM/10ML PO SUSP
1.0000 g | Freq: Three times a day (TID) | ORAL | Status: DC
  Administered 2019-10-16 – 2019-11-03 (×66): 1 g via ORAL
  Filled 2019-10-16 (×67): qty 10

## 2019-10-16 MED ORDER — OSMOLITE 1.5 CAL PO LIQD
1000.0000 mL | ORAL | Status: DC
  Administered 2019-10-16: 20:00:00 1000 mL
  Filled 2019-10-16 (×2): qty 1000

## 2019-10-16 SURGICAL SUPPLY — 15 items

## 2019-10-16 NOTE — Brief Op Note (Signed)
09/03/2019 - 10/16/2019  8:40 AM  PATIENT:  George Wade.  64 y.o. male  PRE-OPERATIVE DIAGNOSIS:  esophageal stricture  POST-OPERATIVE DIAGNOSIS:  esophageal dilation 10, 11, 12, 13.5; clip plalced in gastric  PROCEDURE:  Procedure(s): ESOPHAGOGASTRODUODENOSCOPY (EGD) WITH PROPOFOL (N/A) BALLOON DILATION (N/A)  SURGEON:  Surgeon(s) and Role:    [name], MD - Primary  PHYSICIAN ASSISTANT:   ASSISTANTS: Velva Harman, Tech   ANESTHESIA:   MAC  EBL:  1 mL   BLOOD ADMINISTERED:none  DRAINS: none   LOCAL MEDICATIONS USED:  NONE  SPECIMEN:  No Specimen  DISPOSITION OF SPECIMEN:  N/A  COUNTS:  YES  TOURNIQUET:  * No tourniquets in log *  DICTATION: .Dragon Dictation  PLAN OF CARE: Admit to inpatient   PATIENT DISPOSITION:  PACU - hemodynamically stable.   Delay start of Pharmacological VTE agent (>24hrs) due to surgical blood loss or risk of bleeding: yes

## 2019-10-16 NOTE — Op Note (Addendum)
[facility] Patient Name: George Wade Procedure Date : 10/16/2019 MRN: [id number] Attending MD: [name] , MD Date of Birth: 1956/08/18 CSN: [id number] Age: 64 Admit Type: Outpatient Procedure:                Upper GI endoscopy Indications:              For therapy of esophageal stenosis Providers:                [name], MD, Angus Seller, Waynette Buttery., Technician, Neldon Newport CRNA, CRNA Referring MD:             Revonda Standard. [name], MD/Thoracic Surgeon Medicines:                Monitored Anesthesia Care Complications:            No immediate complications. Estimated blood loss:                            Minimal. Estimated Blood Loss:     Estimated blood loss was minimal. Procedure:                Pre-Anesthesia Assessment:                           - Prior to the procedure, a History and Physical                            was performed, and patient medications and                            allergies were reviewed. The patient's tolerance of                            previous anesthesia was also reviewed. The risks                            and benefits of the procedure and the sedation                            options and risks were discussed with the patient.                            All questions were answered, and informed consent                            was obtained. Prior Anticoagulants: The patient has                            taken Lovenox (enoxaparin), last dose was 10 days                            prior to procedure. ASA Grade Assessment: III - A  patient with severe systemic disease. After                            reviewing the risks and benefits, the patient was                            deemed in satisfactory condition to undergo the                            procedure.                           After obtaining informed consent, the endoscope was                             passed under direct vision. Throughout the                            procedure, the patient's blood pressure, pulse, and                            oxygen saturations were monitored continuously. The                            GIF-H190 ([id number]) Olympus gastroscope was                            introduced through the mouth, and advanced to the                            second part of duodenum. The upper GI endoscopy was                            accomplished without difficulty. The patient                            tolerated the procedure well. Scope In: Scope Out: Findings:      One benign-appearing, intrinsic severe (stenosis; an endoscope cannot       pass) stenosis was found 33 cm from the incisors. This stenosis measured       9 mm (inner diameter). The stenosis was traversed after dilation. A TTS       dilator was passed through the scope. Dilation with a 07-22-11 mm x 8 cm       CRE balloon dilator was performed with 10 mm, 11 mm and 12 mm balloons       for 1 minute each and the scope could be advanced in to the gastric       cavity thereafter. Next size balloon used was 13.5 mm for 1 minute. The       dilation site was examined following endoscope reinsertion and showed       moderate mucosal disruption and complete resolution of luminal narrowing.      A linear plastic foreign body was noted at the dilation site       ?suture(patient had repair of esophageal perforation).      Mucosal  trauma was noted in the proximal gastric body from the tip of       the balloon catheter and an endoclip was deployed at the site.      A 3 cm hiatal hernia was present.      There was evidence of an intact gastrostomy with a patent G-tube present       in the gastric body. This was characterized by healthy appearing mucosa.       The tip of the feeding tube was in the duodenum.      The cardia and gastric fundus were normal on retroflexion.      The examined duodenum was  normal. Impression:               - Benign-appearing esophageal stenosis. Dilated.                            Endoclip applied at site of mucosal trauma in                            proximal gastric body.                           - 3 cm hiatal hernia.                           - Intact gastrostomy with a patent G-tube present                            characterized by healthy appearing mucosa.                           - Normal examined duodenum.                           - No specimens collected. Moderate Sedation:      Patient did not receive moderate sedation for this procedure, but       instead received monitored anesthesia care. Recommendation:           - Mechanical soft diet.                           - Continue present medications. PPI BID and                            Sucralfate QID for 2 months.                           - Repeat upper endoscopy in 2-4 weeks for                            retreatment as an outpatient. Procedure Code(s):        --- Professional ---                           [phone], Esophagogastroduodenoscopy, flexible,                            transoral;  with transendoscopic balloon dilation of                            esophagus (less than 30 mm diameter) Diagnosis Code(s):        --- Professional ---                           K22.2, Esophageal obstruction                           K44.9, Diaphragmatic hernia without obstruction or                            gangrene                           Z93.1, Gastrostomy status CPT copyright 2019 [employer]. All rights reserved. The codes documented in this report are preliminary and upon coder review may  be revised to meet current compliance requirements. [name], MD 10/16/2019 8:40:16 AM This report has been signed electronically. Number of Addenda: 0

## 2019-10-16 NOTE — Anesthesia Preprocedure Evaluation (Addendum)
Anesthesia Evaluation  Patient identified by MRN, date of birth, ID band Patient awake    Reviewed: Allergy & Precautions, NPO status , Patient's Chart, lab work & pertinent test results  History of Anesthesia Complications Negative for: history of anesthetic complications  Airway Mallampati: II  TM Distance: >3 FB Neck ROM: Full    Dental  (+) Poor Dentition, Dental Advidsory Given   Pulmonary PE   Pulmonary exam normal        Cardiovascular hypertension, Normal cardiovascular exam     Neuro/Psych negative neurological ROS  negative psych ROS   GI/Hepatic (+)     substance abuse  alcohol use,  PEG Hx esophageal stricture Boerhave's syndrome S/p right thoracotomy, repair of esophogeal perforation with intercostal muscle flap    Endo/Other  negative endocrine ROS  Renal/GU negative Renal ROS     Musculoskeletal negative musculoskeletal ROS (+)   Abdominal   Peds  Hematology  (+) anemia ,  Non-Hodgkins lymphoma    Anesthesia Other Findings Hx tonsillar cancer  Reproductive/Obstetrics                           Anesthesia Physical Anesthesia Plan  ASA: III  Anesthesia Plan: MAC   Post-op Pain Management:    Induction: Intravenous  PONV Risk Score and Plan: 1 and Propofol infusion and Treatment may vary due to age or medical condition  Airway Management Planned: Nasal Cannula and Natural Airway  Additional Equipment: None  Intra-op Plan:   Post-operative Plan:   Informed Consent: I have reviewed the patients History and Physical, chart, labs and discussed the procedure including the risks, benefits and alternatives for the proposed anesthesia with the patient or authorized representative who has indicated his/her understanding and acceptance.       Plan Discussed with: CRNA and Anesthesiologist  Anesthesia Plan Comments:       Anesthesia Quick Evaluation

## 2019-10-16 NOTE — Op Note (Addendum)
EGD was performed for treatment of esophageal stenosis.  A tight stenosis was noted at 33 cm from incisors, adult gastroscope could not be advanced beyond the stenosis. Balloon dilation was performed with 10 mm, 11 mm and 12 mm balloons, each for 1 consecutive minute. Thereafter the scope could be advanced into the gastric cavity. A linear plastic like material noted at the area of stenosis, possibly suture. 3 cm hiatal hernia noted. Evidence of intact gastrostomy, normal appearance of underlying mucosa, tip of the feeding tube was noted in the duodenum. Retroflexion was unremarkable. Rest of the gastric cavity, antrum, duodenum and duodenal bulb were normal in appearance. At the end of the procedure, esophageal stenotic area was dilated with 13.5 mm balloon. The tip of the balloon catheter cause mucosal trauma in the proximal gastric body which was treated with placement of 1 Endo Clip.  The patient tolerated the procedure well.   Recommendation: Start mechanical soft diet. PPI twice daily and sucralfate 4 times daily for the next 2 months. Will need repeat EGD for dilatation as an outpatient in the next 2 to 4 weeks.  I have reached out to the surgical PA at [facility], as patient has a large fungating anal mass which needs excision.  [name], MD

## 2019-10-16 NOTE — Anesthesia Procedure Notes (Signed)
Procedure Name: MAC Date/Time: 10/16/2019 8:00 AM Performed by: Neldon Newport, CRNA Pre-anesthesia Checklist: Timeout performed, Patient being monitored, Suction available, Emergency Drugs available and Patient identified Oxygen Delivery Method: Simple face mask

## 2019-10-16 NOTE — Progress Notes (Signed)
Anal lesions discussed with Dr. Dema Severin. This would not be a simple fix while here in the hospital. This would require extensive planning, possible V-Y advancement flap and combination surgery with plastics. Sometimes these are referred to tertiary care facilities for management. Recommend keeping outpatient appointment with Dr. Marcello Moores in our office for now. I will reach out to Dr. Marcello Moores to confirm that this is something that she could take care of; if not he may need referral to [facility] or other surrounding tertiary care facility.

## 2019-10-16 NOTE — Progress Notes (Signed)
PT Cancellation Note  Patient Details Name: George Wade. MRN: [id number] DOB: 10-Aug-1956   Cancelled Treatment:    Reason Eval/Treat Not Completed: Patient declined, no reason specified Patient declined participating in therapy at this time and reports not feeling well from procedure this today. Pt request PT return tomorrow. PT will continue to follow acutely.   Earney Navy, PTA Acute Rehabilitation Services Pager: [phone] Office: [phone]   10/16/2019, 3:55 PM

## 2019-10-16 NOTE — Progress Notes (Signed)
Nutrition Follow-up  RD working remotely.  DOCUMENTATION CODES:   Non-severe (moderate) malnutrition in context of social or environmental circumstances  INTERVENTION:   Continue nocturnaltube feeding: - Osmolite 1.5 @75ml /hr via J-tube to run for 12 hours from 1900 to 0700 daily - Pro-stat 30 ml BID  Nocturnal tube feeding regimen provides1550kcal,86grams of protein, and639ml of H2O(70% of kcal needs, 78% of protein needs).  -Ensure Enlive po BID, each supplement provides 350 kcal and 20 grams of protein  NUTRITION DIAGNOSIS:   Moderate Malnutrition related to social / environmental circumstances (homeless, ETOH abuse) as evidenced by moderate muscle depletion, moderate fat depletion, mild fat depletion.  Ongoing, being addressed via diet advancement, supplements, and TF  GOAL:   Patient will meet greater than or equal to 90% of their needs  Progressing  MONITOR:   Weight trends, Labs, I & O's, TF tolerance, Skin  REASON FOR ASSESSMENT:   Malnutrition Screening Tool    ASSESSMENT:   Patient with PMH significant for ETOH abuse, non-hodgkin's lymphoma, tonsillar cancer, esophogeal stricture s/p PEG, and HTN. Presents this admission with esophageal perforation.  11/24 -rightthoracotomy, repair of perforation, extubated 11/25 - G-tube converted to G-J tube 11/29 - pt removed NG tube, G-J tube clogged 12/01 - G-J tube unclogged, swallow study showing residual distal esophageal stricture and no leak of contrast 12/02 - diet advanced to clear liquids 12/04 - NPO, G-J tube fell out, transferred to ICU for sepsis 12/05 - transferred back to progressive care, G-J tube replaced 12/08 - PICC line 12/10 - swallow study showing no esophageal leak and severe distal esophageal stricture 12/11 - diet advanced to sips of clear liquids 12/16 - s/p EGD with dilation 12/21 - s/p EGD with dilation 12/22 - soft diet  12/25 - Regular diet 12/31 - s/p EGD with dilation,  clear liquids 01/01 - full liquids 01/05 - s/p EGD with dilation  Pt with repeat esophageal dilation this AM. Pt is NPO at this time. GI recommending starting mechanical soft diet.  Noted nocturnal tube feeding orders had expired. RD reordered nocturnal tube feeding.  Weight today recorded as 142 lbs which is a 14 lb weight loss since 10/15/19. Question accuracy of today's weight given pt's weight had been stable between 153-156 lbs over the last week.  Meal Completion: 15-100% x last 8 meals (mostly liquid meals)  Medications reviewed and include: Ensure Enlive BID, folic acid, protonix, thiamine  Labs reviewed: hemoglobin 8.5  UOP: 2525 ml x 24 hours  Diet Order:   Diet Order            Diet NPO time specified  Diet effective midnight              EDUCATION NEEDS:   Not appropriate for education at this time  Skin:  Skin Assessment: Skin Integrity Issues: Skin Integrity Issues: Incisions: right chest  Last BM:  10/14/19  Height:   Ht Readings from Last 1 Encounters:  09/04/19 5\' 8"  (1.727 m)    Weight:   Wt Readings from Last 1 Encounters:  10/16/19 64.5 kg    Ideal Body Weight:  70 kg  BMI:  Body mass index is 21.62 kg/m.  Estimated Nutritional Needs:   Kcal:  2200-2400 kcal  Protein:  110-125 grams  Fluid:  >/= 2.2 L/day    Gaynell Face, MS, RD, LDN Inpatient Clinical Dietitian Pager: [phone] Weekend/After Hours: [phone]

## 2019-10-16 NOTE — Transfer of Care (Signed)
Immediate Anesthesia Transfer of Care Note  Patient: George Wade.  Procedure(s) Performed: ESOPHAGOGASTRODUODENOSCOPY (EGD) WITH PROPOFOL (N/A ) BALLOON DILATION (N/A )  Patient Location: Endoscopy Unit  Anesthesia Type:MAC  Level of Consciousness: awake, alert  and oriented  Airway & Oxygen Therapy: Patient Spontanous Breathing and Patient connected to face mask oxygen  Post-op Assessment: Report given to RN, Post -op Vital signs reviewed and stable and Patient moving all extremities X 4  Post vital signs: Reviewed and stable  Last Vitals:  Vitals Value Taken Time  BP    Temp    Pulse    Resp    SpO2      Last Pain:  Vitals:   10/16/19 0738  TempSrc: Temporal  PainSc: 4       Patients Stated Pain Goal: 0 (85/02/77 4128)  Complications: No apparent anesthesia complications

## 2019-10-16 NOTE — Anesthesia Postprocedure Evaluation (Signed)
Anesthesia Post Note  Patient: George Wade.  Procedure(s) Performed: ESOPHAGOGASTRODUODENOSCOPY (EGD) WITH PROPOFOL (N/A ) BALLOON DILATION (N/A )     Patient location during evaluation: PACU Anesthesia Type: MAC Level of consciousness: awake and alert Pain management: pain level controlled Vital Signs Assessment: post-procedure vital signs reviewed and stable Respiratory status: spontaneous breathing, nonlabored ventilation and respiratory function stable Cardiovascular status: stable and blood pressure returned to baseline Anesthetic complications: no    Last Vitals:  Vitals:   10/16/19 0830 10/16/19 0845  BP: 119/81 121/83  Pulse:  93  Resp: 18 19  Temp: 36.7 C   SpO2: 99% 98%    Last Pain:  Vitals:   10/16/19 0845  TempSrc:   PainSc: 0-No pain                 Audry Pili

## 2019-10-16 NOTE — Progress Notes (Signed)
      [address]       [place] [address]             [phone]      Day of Surgery Procedure(s) (LRB): ESOPHAGOGASTRODUODENOSCOPY (EGD) WITH PROPOFOL (N/A) BALLOON DILATION (N/A)   Subjective:  Patient just back from EGD.  He continues to complain about rectal pain, incontinence, he would really like this taken care of while in the hospital.   Objective: Vital signs in last 24 hours: Temp:  [97.9 F (36.6 C)-98.2 F (36.8 C)] 98 F (36.7 C) (01/05 0830) Pulse Rate:  [86-105] 93 (01/05 0845) Cardiac Rhythm: Normal sinus rhythm (01/04 1900) Resp:  [10-19] 19 (01/05 0845) BP: (93-140)/(66-97) 121/83 (01/05 0845) SpO2:  [98 %-99 %] 98 % (01/05 0845) Weight:  [64.5 kg] 64.5 kg (01/05 0525)  Intake/Output from previous day: 01/04 0701 - 01/05 0700 In: -  Out: 2525 [Urine:2525] Intake/Output this shift: Total I/O In: 514.9 [I.V.:514.9] Out: 1 [Blood:1]  General appearance: alert, cooperative and no distress Heart: regular rate and rhythm Lungs: clear to auscultation bilaterally Abdomen: soft, non-tender; bowel sounds normal; no masses,  no organomegaly Wound: well healed  Lab Results: No results for input(s): WBC, HGB, HCT, PLT in the last 72 hours. BMET: No results for input(s): NA, K, CL, CO2, GLUCOSE, BUN, CREATININE, CALCIUM in the last 72 hours.  PT/INR: No results for input(s): LABPROT, INR in the last 72 hours. ABG    Component Value Date/Time   PHART 7.432 09/14/2019 1405   HCO3 24.1 09/14/2019 1405   TCO2 28 09/04/2019 1241   ACIDBASEDEF 6.0 (H) 09/04/2019 0522   O2SAT 95.4 09/14/2019 1405   CBG (last 3)  No results for input(s): GLUCAP in the last 72 hours.  Assessment/Plan: S/P Procedure(s) (LRB): ESOPHAGOGASTRODUODENOSCOPY (EGD) WITH PROPOFOL (N/A) BALLOON DILATION (N/A)  1. CV- hemodynamically stable 2. Pulm- s/p thoracotomy for esophageal repair, no acute issues, continue IS 3. GI- esophageal stricture, for repeat  dilatation by GI today 4. 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  I have reached out to Dr. Marcello Moores whom I made office appointment with who is at [facility] this week to see if she can evaluate patient while here 5. Social- patient is homeless, CSW is working on placement, currently no placement offers, 6. Dispo- patient stable, continue current care, hopefully Dr. Marcello Moores can evaluate this week, diet per GI   LOS: 43 days   Ellwood Handler, PA-C 10/16/2019

## 2019-10-16 NOTE — Interval H&P Note (Signed)
History and Physical Interval Note: 63/male with esophageal stricture, multiple balloon dilations for a repeat EGD and balloon dilation.  10/16/2019 7:49 AM  George Wade.  has presented today for EGD with balloon dilation, with the diagnosis of esophageal stricture.  The various methods of treatment have been discussed with the patient and family. After consideration of risks, benefits and other options for treatment, the patient has consented to  Procedure(s): ESOPHAGOGASTRODUODENOSCOPY (EGD) WITH PROPOFOL (N/A) as a surgical intervention.  The patient's history has been reviewed, patient examined, no change in status, stable for surgery.  I have reviewed the patient's chart and labs.  Questions were answered to the patient's satisfaction.     [name]

## 2019-10-17 LAB — COMPREHENSIVE METABOLIC PANEL
ALT: 17 U/L (ref 0–44)
AST: 17 U/L (ref 15–41)
Albumin: 3.1 g/dL — ABNORMAL LOW (ref 3.5–5.0)
Alkaline Phosphatase: 101 U/L (ref 38–126)
Anion gap: 10 (ref 5–15)
BUN: 15 mg/dL (ref 8–23)
CO2: 25 mmol/L (ref 22–32)
Calcium: 9.2 mg/dL (ref 8.9–10.3)
Chloride: 102 mmol/L (ref 98–111)
Creatinine, Ser: 0.52 mg/dL — ABNORMAL LOW (ref 0.61–1.24)
GFR calc Af Amer: >60 mL/min (ref >60)
GFR calc non Af Amer: >60 mL/min (ref >60)
Glucose, Bld: 105 mg/dL — ABNORMAL HIGH (ref 70–99)
Potassium: 4.5 mmol/L (ref 3.5–5.1)
Sodium: 137 mmol/L (ref 135–145)
Total Bilirubin: 0.3 mg/dL (ref 0.3–1.2)
Total Protein: 5.9 g/dL — ABNORMAL LOW (ref 6.5–8.1)

## 2019-10-17 LAB — CBC
HCT: 29.7 % — ABNORMAL LOW (ref 39.0–52.0)
Hemoglobin: 8.8 g/dL — ABNORMAL LOW (ref 13.0–17.0)
MCH: 24.6 pg — ABNORMAL LOW (ref 26.0–34.0)
MCHC: 29.6 g/dL — ABNORMAL LOW (ref 30.0–36.0)
MCV: 83.2 fL (ref 80.0–100.0)
Platelets: 328 10*3/uL (ref 150–400)
RBC: 3.57 MIL/uL — ABNORMAL LOW (ref 4.22–5.81)
RDW: 15.6 % — ABNORMAL HIGH (ref 11.5–15.5)
WBC: 6.9 10*3/uL (ref 4.0–10.5)
nRBC: 0 % (ref 0.0–0.2)

## 2019-10-17 MED ORDER — APIXABAN 5 MG PO TABS
5.0000 mg | ORAL_TABLET | Freq: Two times a day (BID) | ORAL | Status: DC
  Administered 2019-10-17 – 2019-10-18 (×3): 5 mg via ORAL
  Filled 2019-10-17 (×3): qty 1

## 2019-10-17 MED ORDER — OSMOLITE 1.5 CAL PO LIQD
237.0000 mL | Freq: Four times a day (QID) | ORAL | Status: DC
  Administered 2019-10-17 – 2019-11-03 (×35): 237 mL
  Filled 2019-10-17 (×70): qty 237

## 2019-10-17 NOTE — Progress Notes (Signed)
Subjective: Patient has been started on mechanical soft diet, was able to eat mashed potato and oatmeal with minimal difficulty swallowing. He has had 2 semiformed bowel movements. He complains of decreased energy and fatigue. Denies abdominal pain nausea or vomiting.  Objective: Vital signs in last 24 hours: Temp:  [97.9 F (36.6 C)-98.1 F (36.7 C)] 98.1 F (36.7 C) (01/06 0829) Pulse Rate:  [93-100] 100 (01/06 0829) Resp:  [15-24] 18 (01/06 0829) BP: (83-138)/(66-88) 83/66 (01/06 0829) SpO2:  [96 %-99 %] 96 % (01/06 0829) Weight:  [71.8 kg] 71.8 kg (01/06 0615) Weight change: 7.3 kg Last BM Date: 10/14/19  PE: Sitting up on bed, was eating his breakfast GENERAL: Not in distress ABDOMEN: Feeding tube in place, abdomen soft, nondistended, nontender EXTREMITIES: No deformity  Lab Results: No results found for this or any previous visit (from the past 48 hour(s)).  Studies/Results: No results found.  Medications: I have reviewed the patient's current medications.  Assessment: Esophageal stenosis-balloon dilation up to 13.5 mm balloon yesterday, tolerating mechanical soft diet Continued on G-tube feeding at night.  Status post right thoracotomy and repair of esophageal perforation with intracoastal mucosal flap  Rectal mass-as per documentation from surgical team would require extensive planning, possible V-Y advancement flap and combination surgery with plastics, patient recommended to keep outpatient appointment with Dr. Marcello Moores on 10/23/2019.  Plan: As patient is able to tolerate mechanical soft diet, continue the same, along with continuing tube feedings at night to maintain adequate nutrition, which can be done even as an outpatient. Patient will require repeated endoscopies on regular intervals which can be done as an outpatient for further esophageal dilations. Meanwhile continue on PPI twice daily and sucralfate suspension 4 times daily.  [name], MD 10/17/2019,  8:33 AM

## 2019-10-17 NOTE — Plan of Care (Signed)
  Problem: Education: Goal: Knowledge of General Education information will improve Description: Including pain rating scale, medication(s)/side effects and non-pharmacologic comfort measures Outcome: Progressing   Problem: Health Behavior/Discharge Planning: Goal: Ability to manage health-related needs will improve Outcome: Progressing   Problem: Clinical Measurements: Goal: Ability to maintain clinical measurements within normal limits will improve Outcome: Progressing Goal: Will remain free from infection Outcome: Progressing Goal: Diagnostic test results will improve Outcome: Progressing Goal: Respiratory complications will improve Outcome: Progressing Goal: Cardiovascular complication will be avoided Outcome: Progressing   Problem: Activity: Goal: Risk for activity intolerance will decrease Outcome: Progressing   Problem: Nutrition: Goal: Adequate nutrition will be maintained Outcome: Progressing   Problem: Coping: Goal: Level of anxiety will decrease Outcome: Progressing   Problem: Elimination: Goal: Will not experience complications related to bowel motility Outcome: Progressing Goal: Will not experience complications related to urinary retention Outcome: Progressing   Problem: Pain Managment: Goal: General experience of comfort will improve Outcome: Progressing   Problem: Safety: Goal: Ability to remain free from injury will improve Outcome: Progressing   Problem: Skin Integrity: Goal: Risk for impaired skin integrity will decrease Outcome: Progressing   Problem: Bowel/Gastric: Goal: Gastrointestinal status for postoperative course will improve Outcome: Progressing   Problem: Nutritional: Goal: Ability to achieve adequate nutritional intake will improve Outcome: Progressing   Problem: Clinical Measurements: Goal: Postoperative complications will be avoided or minimized Outcome: Progressing   Problem: Respiratory: Goal: Ability to maintain a  clear airway will improve Outcome: Progressing   Problem: Skin Integrity: Goal: Demonstration of wound healing without infection will improve Outcome: Progressing

## 2019-10-17 NOTE — TOC Progression Note (Addendum)
Transition of Care (TOC) - Progression Note    Patient Details  Name: George Wade. MRN: [id number] Date of Birth: Sep 04, 1956  Transition of Care [facility]) CM/SW Contact  [name], RN Phone Number: 10/17/2019, 3:49 PM  Clinical Narrative:    Per STaff RN they are teaching patient to do bolus feeds, awaitng bolus feed information from nutrition for tube feeding supplies. He has to come to [facility] as a walk in between 8 am and 1 pm and ask for a hotel for [place] to see if he qualifies.  Will need to call transportation to transport patient to [facility] at dc.  NCM will set up tube feeding supplies with [employer] for charity, made referral to [facility].  Referral made to [facility] with [facility] for [facility]  for chartiy,  Will need HHRN orders.  He will need to get several bus passes also.  [employer] will see if they can bring tube feeding supplies to patient room , with walker.  NCM spoke with [name] and she states the hotel he will be going to is [name] at [employer], [place] [address], but he may only be able to stay there one day.  NCM checking with [name] to see if he can be candidate for the [employer] program which means if so he would be able to stay at hotel longer.  Waiting to hear back.    1/7- NCM spoke with [facility] and verified patient would be able to be at the hotel til March 1st if has good behaivor.  NCM spoke with [name] she states the [facility] would be a better program for this patient, he would get 3 meals per day thru the [facility].  NCM has verified with [name] he can bring tube feeds to patient room before discharge.  Patient has a walker in the room that is his walker. The [facility] will be able to do a couple of visits to check on tube feeding at the hotel.  1340-  NCM received call from [name] stating patient maybe be transferred to [facility] for further follow up.     Expected Discharge Plan: [place] Barriers to Discharge: Inadequate or no insurance,  Continued Medical Work up  Expected Discharge Plan and Services Expected Discharge Plan: [place] arrangements for the past 2 months: Hotel/Motel                                       Social Determinants of Health (SDOH) Interventions    Readmission Risk Interventions No flowsheet data found.

## 2019-10-17 NOTE — Progress Notes (Signed)
Nutrition Follow-up  RD working remotely.  DOCUMENTATION CODES:   Non-severe (moderate) malnutrition in context of social or environmental circumstances  INTERVENTION:   Initiate bolus tube feeding regimen via G-port of G-J tube: - 1 carton (237 ml) Osmolite 1.5 cal formula QID  Tube feeding regimen provides 1422 kcal, 64 grams of protein, and 722 ml of H2O (65% minimum kcal needs, 58% minimum protein needs).  - Encourage adequate PO intake  - Continue Ensure Enlive po BID, each supplement provides 350 kcal and 20 grams of protein  NUTRITION DIAGNOSIS:   Moderate Malnutrition related to social / environmental circumstances (homeless, ETOH abuse) as evidenced by moderate muscle depletion, moderate fat depletion, mild fat depletion.  Ongoing, being addressed via diet advancement, supplements, and TF  GOAL:   Patient will meet greater than or equal to 90% of their needs  Progressing  MONITOR:   Weight trends, Labs, I & O's, TF tolerance, Skin  REASON FOR ASSESSMENT:   Malnutrition Screening Tool    ASSESSMENT:   Patient with PMH significant for ETOH abuse, non-hodgkin's lymphoma, tonsillar cancer, esophogeal stricture s/p PEG, and HTN. Presents this admission with esophageal perforation.  11/24 -rightthoracotomy, repair of perforation, extubated 11/25 - G-tube converted to G-J tube 11/29 - pt removed NG tube, G-J tube clogged 12/01 - G-J tube unclogged, swallow study showing residual distal esophageal stricture and no leak of contrast 12/02 - diet advanced to clear liquids 12/04 - NPO, G-J tube fell out, transferred to ICU for sepsis 12/05 - transferred back to progressive care, G-J tube replaced 12/08 - PICC line 12/10 - swallow study showing no esophageal leak and severe distal esophageal stricture 12/11 - diet advanced to sips of clear liquids 12/16 - s/p EGD with dilation 12/21 - s/p EGD with dilation 12/22 - soft diet  12/25 - Regular diet 12/31 - s/p EGD  with dilation, clear liquids 01/01 - full liquids 01/05 - s/p EGD with dilation, dysphagia 1 diet  RD received page from CTS regarding switching pt to bolus tube feeds via G-tube. Per CTS, pt familiar with providing himself bolus feeds via G-tube from experiences PTA.  Discussed plan with Case Manager and RN.  Overall, weight trending up over the last week. Suspect weight of 142 lbs from yesterday was an error.  Meal Completion: 25-100% x last 8 meals  Medications reviewed and include: Ensure Enlive BID, folic acid, protonix, thiamine  Labs reviewed: hemoglobin 8.8  UOP: 1600 ml x 24 hours  Diet Order:   Diet Order            DIET - DYS 1 Room service appropriate? Yes; Fluid consistency: Thin  Diet effective now              EDUCATION NEEDS:   Not appropriate for education at this time  Skin:  Skin Assessment: Skin Integrity Issues: Skin Integrity Issues: Incisions: chest, back  Last BM:  10/14/19  Height:   Ht Readings from Last 1 Encounters:  09/04/19 5\' 8"  (1.727 m)    Weight:   Wt Readings from Last 1 Encounters:  10/17/19 71.8 kg    Ideal Body Weight:  70 kg  BMI:  Body mass index is 24.07 kg/m.  Estimated Nutritional Needs:   Kcal:  2200-2400 kcal  Protein:  110-125 grams  Fluid:  >/= 2.2 L/day    Gaynell Face, MS, RD, LDN Inpatient Clinical Dietitian Pager: [phone] Weekend/After Hours: [phone]

## 2019-10-17 NOTE — Progress Notes (Signed)
Physical Therapy Treatment and Discharge Patient Details Name: George Wade. MRN: [id number] DOB: [birth year] Today's Date: 10/17/2019    History of Present Illness Pt is a 64 y.o. male admitted 09/03/19 with c/o R-side chest pain. CT showed R hydropneumothorax and extensive distal pneumomediastinum. Pt s/p R major thoracotomy for esophageal performation with intercostal muscle flap on 11/24. s/p EGD and balloon dilation 12/21.  PMH includes alcohol abuse, non-Hodgkin's lymphoma, tonsillar cancer, HTN, PE, esophageal stricture. Of note, recent admission 07/2019-08/2019 for chest pain and alcohol-induced mood disorder.    PT Comments    Pt tolerated treatment well and is now ambulating independently for community distances and performing dynamic balance during household chores without assistance. Pt with some increased drift initially during gait but progressing to steady step through gait. Pt requires no further acute PT services at this time and is encouraged to ambulate 2-3 times out of the room to maintain his current functional status. Pt in agreement with no further PT needs. Acute PT signing off.   Follow Up Recommendations  No PT follow up     Equipment Recommendations  None recommended by PT    Recommendations for Other Services       Precautions / Restrictions Precautions Precautions: Fall Restrictions Weight Bearing Restrictions: No    Mobility  Bed Mobility Overal bed mobility: Independent Bed Mobility: Supine to Sit Rolling: Independent            Transfers Overall transfer level: Independent Equipment used: None Transfers: Sit to/from Stand Sit to Stand: Independent            Ambulation/Gait Ambulation/Gait assistance: Modified independent (Device/Increase time) Gait Distance (Feet): 800 Feet Assistive device: None Gait Pattern/deviations: Step-through pattern;Wide base of support;Drifts right/left Gait velocity: functional Gait velocity  interpretation: 1.31 - 2.62 ft/sec, indicative of limited community ambulator General Gait Details: pt initially with drift left/right noted, no significant LOB, progressing to steady step through gait   Stairs             Wheelchair Mobility    Modified Rankin (Stroke Patients Only)       Balance Overall balance assessment: Independent Sitting-balance support: No upper extremity supported;Feet supported Sitting balance-Leahy Scale: Normal     Standing balance support: During functional activity Standing balance-Leahy Scale: Good Standing balance comment: no LOB noted, pt managing linens in room while also holding foley independently                            Cognition Arousal/Alertness: Awake/alert Behavior During Therapy: WFL for tasks assessed/performed Overall Cognitive Status: Within Functional Limits for tasks assessed                                 General Comments: pt concerned about discharge disposition due to medical needs and poor prognosis      Exercises      General Comments General comments (skin integrity, edema, etc.): VSS on RA      Pertinent Vitals/Pain Pain Assessment: No/denies pain    Home Living                      Prior Function            PT Goals (current goals can now be found in the care plan section) Acute Rehab PT Goals Patient Stated Goal: to be able to eat all  foods and get stronger Progress towards PT goals: Goals met/education completed, patient discharged from PT    Frequency    (D/C PT)      PT Plan Discharge plan needs to be updated    Co-evaluation              AM-PAC PT "6 Clicks" Mobility   Outcome Measure  Help needed turning from your back to your side while in a flat bed without using bedrails?: None Help needed moving from lying on your back to sitting on the side of a flat bed without using bedrails?: None Help needed moving to and from a bed to a chair  (including a wheelchair)?: None Help needed standing up from a chair using your arms (e.g., wheelchair or bedside chair)?: None Help needed to walk in hospital room?: None Help needed climbing 3-5 steps with a railing? : A Little 6 Click Score: 23    End of Session Equipment Utilized During Treatment: (none) Activity Tolerance: Patient tolerated treatment well Patient left: in chair;with call bell/phone within reach Nurse Communication: Mobility status PT Visit Diagnosis: Other abnormalities of gait and mobility (R26.89);Muscle weakness (generalized) (M62.81);Pain     Time: 1747-1595 PT Time Calculation (min) (ACUTE ONLY): 13 min  Charges:  $Gait Training: 8-22 mins                     Zenaida Niece, PT, DPT Acute Rehabilitation Pager: [phone]    Zenaida Niece 10/17/2019, 4:10 PM

## 2019-10-17 NOTE — Progress Notes (Signed)
1 Day Post-Op Procedure(s) (LRB): ESOPHAGOGASTRODUODENOSCOPY (EGD) WITH PROPOFOL (N/A) BALLOON DILATION (N/A) Subjective: Says he tolerated PO's much better yesterday following EGD / repeat dilation. Doesn't feel as well this morning but has no specific complaints. Denies nausea or pain.   Objective: Vital signs in last 24 hours: Temp:  [97.9 F (36.6 C)-98.1 F (36.7 C)] 98.1 F (36.7 C) (01/06 0401) Pulse Rate:  [93-99] 93 (01/06 0401) Cardiac Rhythm: Normal sinus rhythm (01/06 0300) Resp:  [15-24] 15 (01/06 0401) BP: (98-138)/(73-88) 98/73 (01/06 0401) SpO2:  [98 %-99 %] 98 % (01/06 0401) Weight:  [71.8 kg] 71.8 kg (01/06 0615)     Intake/Output from previous day: 01/05 0701 - 01/06 0700 In: 3085.4 [P.O.:837; I.V.:524.9; NG/GT:1723.5] Out: 1601 [Urine:1600; Blood:1] Intake/Output this shift: No intake/output data recorded.  General appearance: alert, cooperative and mild distress Neurologic: intact Heart: regular rate and rhythm Lungs: Breath sounds are clear Abdomen: Soft and non-tender. G-J tube site OK Wound: chest tube sites and incision healing well.   Lab Results: No results for input(s): WBC, HGB, HCT, PLT in the last 72 hours. BMET: No results for input(s): NA, K, CL, CO2, GLUCOSE, BUN, CREATININE, CALCIUM in the last 72 hours.  PT/INR: No results for input(s): LABPROT, INR in the last 72 hours. ABG    Component Value Date/Time   PHART 7.432 09/14/2019 1405   HCO3 24.1 09/14/2019 1405   TCO2 28 09/04/2019 1241   ACIDBASEDEF 6.0 (H) 09/04/2019 0522   O2SAT 95.4 09/14/2019 1405   CBG (last 3)  No results for input(s): GLUCAP in the last 72 hours.  Assessment/Plan: S/P Procedure(s) (LRB): ESOPHAGOGASTRODUODENOSCOPY (EGD) WITH PROPOFOL (N/A) BALLOON DILATION (N/A)  -POD-43right thoracotomy, repair of esophogeal perforation with intercostal muscle flap. -POD-21EGD with dilation of distal esophageal stricture to 92mm . -POD-16repeat EGD with  dilation#2of distal esophageal stricture to 31mm. -POD-6EGD with dilation #3 to 80mm by Dr. Penelope Coop -POD1 EGD with dilation to 13.23mm by Dr. Therisa Doyne. Tolerating PO's, GI folllowing. Continuing to infuseTF via J-tubeat night.  -Incontinence/ rectal mass- Discussed with general surgery. No plans for intervention while in the hospital.   Out patient follow up recommended and has been arranged.   -CM teamhas beenworking on disposition. Currently no placement offers.  -H/OEtOH abuse-Calm and cooperative.Continue Seroquel  -Mild anemia- Hctremainsstable.Monitor.  -Hypokalemia-Resolved.Monitor   LOS: 44 days    [name], [place] [phone] 10/17/2019

## 2019-10-17 NOTE — Progress Notes (Signed)
Occupational Therapy Treatment Patient Details Name: George Wade. MRN: [id number] DOB: 1956/07/10 Today's Date: 10/17/2019    History of present illness Pt is a 64 y.o. male admitted 09/03/19 with c/o R-side chest pain. CT showed R hydropneumothorax and extensive distal pneumomediastinum. Pt s/p R major thoracotomy for esophageal performation with intercostal muscle flap on 11/24. s/p EGD and balloon dilation 12/21.  PMH includes alcohol abuse, non-Hodgkin's lymphoma, tonsillar cancer, HTN, PE, esophageal stricture. Of note, recent admission 07/2019-08/2019 for chest pain and alcohol-induced mood disorder.   OT comments  Pt overall performing ADL at mod independent - supervision level at this time, engaging in LB, standing UB ADL during this session. Pt demonstrating functional transfers with mod independence - supervision and without LOB noted, VSS on RA. Pt reports he has been completing ADL and up ad lib without difficulty in room. Feel pt likely nearing his baseline and with no further acute OT needs at this time. Recommend pt continue to mobilize with nursing staff and continued completion of ADL on his own. Acute OT to sign off at this time.    Follow Up Recommendations  SNF;Supervision/Assistance - 24 hour    Equipment Recommendations  Other (comment)(defer to next venue)          Precautions / Restrictions Precautions Precautions: Fall Restrictions Weight Bearing Restrictions: No       Mobility Bed Mobility Overal bed mobility: Modified Independent Bed Mobility: Supine to Sit;Sit to Supine Rolling: Independent   Supine to sit: Modified independent (Device/Increase time) Sit to supine: Modified independent (Device/Increase time)   General bed mobility comments: HOB elevated  Transfers Overall transfer level: Independent Equipment used: None Transfers: Sit to/from Stand Sit to Stand: Independent              Balance Overall balance assessment:  Independent Sitting-balance support: No upper extremity supported;Feet supported Sitting balance-Leahy Scale: Normal     Standing balance support: During functional activity Standing balance-Leahy Scale: Good Standing balance comment: no LOB noted, pt managing linens in room while also holding foley independently                           ADL either performed or assessed with clinical judgement   ADL Overall ADL's : Needs assistance/impaired             Lower Body Bathing: Supervison/ safety;Sit to/from stand Lower Body Bathing Details (indicate cue type and reason): distant supervision Upper Body Dressing : Set up;Standing Upper Body Dressing Details (indicate cue type and reason): donning new gown                 Functional mobility during ADLs: Supervision/safety General ADL Comments: pt reports has been performing majority of ADL on his own without difficulty      Vision       Perception     Praxis      Cognition Arousal/Alertness: Awake/alert Behavior During Therapy: WFL for tasks assessed/performed Overall Cognitive Status: Within Functional Limits for tasks assessed                                 General Comments: pt concerned about discharge disposition due to medical needs and poor prognosis        Exercises Exercises: General Upper Extremity;General Lower Extremity General Exercises - Upper Extremity Shoulder Flexion: AROM;Both;10 reps;Seated Shoulder ABduction: AROM;Both;10 reps;Seated Shoulder Horizontal ABduction: AROM;Both;10  reps;Seated General Exercises - Lower Extremity Long Arc Quad: AROM;Both;10 reps;Seated Hip Flexion/Marching: AROM;Both;10 reps;Seated Other Exercises Other Exercises: use of flutter valve x5   Shoulder Instructions       General Comments VSS on RA    Pertinent Vitals/ Pain       Pain Assessment: Faces Faces Pain Scale: Hurts a little bit Pain Location: ribcage, R shoulder with  certain ROM  Home Living                                          Prior Functioning/Environment              Frequency  Min 2X/week        Progress Toward Goals  OT Goals(current goals can now be found in the care plan section)  Progress towards OT goals: Goals met/education completed, patient discharged from OT  Acute Rehab OT Goals Patient Stated Goal: to be able to eat all foods and get stronger OT Goal Formulation: All assessment and education complete, DC therapy Potential to Achieve Goals: Good  Plan Discharge plan remains appropriate;All goals met and education completed, patient discharged from OT services    Co-evaluation                 AM-PAC OT "6 Clicks" Daily Activity     Outcome Measure   Help from another person eating meals?: None Help from another person taking care of personal grooming?: None Help from another person toileting, which includes using toliet, bedpan, or urinal?: None Help from another person bathing (including washing, rinsing, drying)?: None Help from another person to put on and taking off regular upper body clothing?: None Help from another person to put on and taking off regular lower body clothing?: None 6 Click Score: 24    End of Session    OT Visit Diagnosis: Unsteadiness on feet (R26.81);Other abnormalities of gait and mobility (R26.89);Muscle weakness (generalized) (M62.81)   Activity Tolerance Patient tolerated treatment well   Patient Left in bed;with call bell/phone within reach   Nurse Communication Mobility status        Time: 4481-8563 OT Time Calculation (min): 24 min  Charges: OT General Charges $OT Visit: 1 Visit OT Treatments $Self Care/Home Management : 8-22 mins $Therapeutic Activity: 8-22 mins  Lou Cal, OT [facility] Pager [phone] Office [place] 10/17/2019, 5:42 PM

## 2019-10-18 ENCOUNTER — Telehealth: Payer: Self-pay | Admitting: Pharmacy Technician

## 2019-10-18 ENCOUNTER — Inpatient Hospital Stay (HOSPITAL_COMMUNITY): Payer: Self-pay

## 2019-10-18 MED ORDER — ENSURE ENLIVE PO LIQD
237.0000 mL | Freq: Two times a day (BID) | ORAL | Status: DC
  Administered 2019-10-19 – 2019-10-23 (×4): 237 mL via ORAL

## 2019-10-18 MED ORDER — TRAMADOL HCL 50 MG PO TABS
50.0000 mg | ORAL_TABLET | Freq: Once | ORAL | Status: AC
  Administered 2019-10-18: 50 mg via ORAL
  Filled 2019-10-18: qty 1

## 2019-10-18 MED ORDER — IOHEXOL 300 MG/ML  SOLN: 25.0000 mL | Freq: Once | INTRAMUSCULAR | Status: DC | PRN

## 2019-10-18 MED ORDER — SODIUM CHLORIDE 0.9 % IV SOLN: INTRAVENOUS | Status: DC

## 2019-10-18 NOTE — Telephone Encounter (Signed)
Patient failed to provide requested 2020 financial documentation. No additional medication assistance will be provided by [facility] without the required proof of income documentation. Patient notified by letter  Haris Baack CPhT [facility] 

## 2019-10-18 NOTE — Progress Notes (Signed)
Subjective: Patient seen and examined at bedside. He keeps spitting up mucus and states he is not able to drink fluids since today morning.  Yesterday he had mechanical soft diet including chicken and peas without associated problems with swallowing.  However, from today morning, he states he has not been feeling well, unable to maintain his secretions and has been spitting mucoid fluid every time he tries to drink. He denies abdominal pain but complains of pain around the anal area.  He is requesting for IV Toradol as he states he cannot swallow tramadol.  Objective: Vital signs in last 24 hours: Temp:  [97.4 F (36.3 C)-98.1 F (36.7 C)] 98 F (36.7 C) (01/07 1138) Pulse Rate:  [93-111] 111 (01/07 1138) Resp:  [14-24] 18 (01/07 1138) BP: (109-123)/(81-94) 123/87 (01/07 1138) SpO2:  [97 %-98 %] 97 % (01/07 1138) Weight:  [71.5 kg] 71.5 kg (01/07 0548) Weight change: -0.3 kg Last BM Date: 10/17/19  PE: In obvious distress, keeps spitting mucoid fluid, unable to hold secretions GENERAL: Ill-appearing, prominent pallor ABDOMEN: Feeding tube in place, nondistended, nontender EXTREMITIES: No deformity  Lab Results: Results for orders placed or performed during the hospital encounter of 09/03/19 (from the past 48 hour(s))  CBC     Status: Abnormal   Collection Time: 10/17/19 10:30 AM  Result Value Ref Range   WBC 6.9 4.0 - 10.5 K/uL   RBC 3.57 (L) 4.22 - 5.81 MIL/uL   Hemoglobin 8.8 (L) 13.0 - 17.0 g/dL   HCT 29.7 (L) 39.0 - 52.0 %   MCV 83.2 80.0 - 100.0 fL   MCH 24.6 (L) 26.0 - 34.0 pg   MCHC 29.6 (L) 30.0 - 36.0 g/dL   RDW 15.6 (H) 11.5 - 15.5 %   Platelets 328 150 - 400 K/uL   nRBC 0.0 0.0 - 0.2 %    Comment: Performed at [facility], [address] [place], [place] [address]  Comprehensive metabolic panel     Status: Abnormal   Collection Time: 10/17/19 10:30 AM  Result Value Ref Range   Sodium 137 135 - 145 mmol/L   Potassium 4.5 3.5 - 5.1 mmol/L   Chloride 102  98 - 111 mmol/L   CO2 25 22 - 32 mmol/L   Glucose, Bld 105 (H) 70 - 99 mg/dL   BUN 15 8 - 23 mg/dL   Creatinine, Ser 0.52 (L) 0.61 - 1.24 mg/dL   Calcium 9.2 8.9 - 10.3 mg/dL   Total Protein 5.9 (L) 6.5 - 8.1 g/dL   Albumin 3.1 (L) 3.5 - 5.0 g/dL   AST 17 15 - 41 U/L   ALT 17 0 - 44 U/L   Alkaline Phosphatase 101 38 - 126 U/L   Total Bilirubin 0.3 0.3 - 1.2 mg/dL   GFR calc non Af Amer >60 >60 mL/min   GFR calc Af Amer >60 >60 mL/min   Anion gap 10 5 - 15    Comment: Performed at [facility] [address] [place], [place] [address]    Studies/Results: DG Chest 2 View  Result Date: 10/18/2019 CLINICAL DATA:  Postoperative evaluation. EXAM: CHEST - 2 VIEW COMPARISON:  10/04/2019.  09/28/2019. FINDINGS: Right PICC line in stable position. Mediastinum and hilar structures normal. Heart size stable. Slight progression of atelectatic changes right lung base. No prominent pleural effusion. No pneumothorax. Improved subcutaneous emphysema right neck and chest wall. Carotid vascular calcification. Postsurgical changes left upper quadrant. IMPRESSION: 1.  Right PICC line stable position.  2.  Slight progression of right base subsegmental atelectasis. 3. Interim improvement of subcutaneous emphysema in the right neck and chest wall. No pneumothorax. Electronically Signed   By: Marcello Moores  Register   On: 10/18/2019 06:38    Medications: I have reviewed the patient's current medications.  Assessment: Esophageal stricture, multiple EGDs and dilation, last performed in 10/16/2019, dilated up to 13.5 mm, asymptomatic until today morning  Esophageal perforation, status post thoracotomy Anal lesion-to be addressed as an outpatient  Plan: Patient had his Eliquis at 82 today. Plan on getting an esophagogram with Gastrografin. Hold tube feedings after midnight today. Keep patient n.p.o. Possible EGD in a.m. for further evaluation.   [name], MD 10/18/2019, 3:24 PM

## 2019-10-18 NOTE — Plan of Care (Signed)
  Problem: Education: Goal: Knowledge of General Education information will improve Description: Including pain rating scale, medication(s)/side effects and non-pharmacologic comfort measures Outcome: Progressing   Problem: Health Behavior/Discharge Planning: Goal: Ability to manage health-related needs will improve Outcome: Progressing   Problem: Clinical Measurements: Goal: Ability to maintain clinical measurements within normal limits will improve Outcome: Progressing Goal: Will remain free from infection Outcome: Progressing Goal: Diagnostic test results will improve Outcome: Progressing Goal: Respiratory complications will improve Outcome: Progressing Goal: Cardiovascular complication will be avoided Outcome: Progressing   Problem: Activity: Goal: Risk for activity intolerance will decrease Outcome: Progressing   Problem: Nutrition: Goal: Adequate nutrition will be maintained Outcome: Progressing   Problem: Coping: Goal: Level of anxiety will decrease Outcome: Progressing   Problem: Elimination: Goal: Will not experience complications related to bowel motility Outcome: Progressing Goal: Will not experience complications related to urinary retention Outcome: Progressing   Problem: Pain Managment: Goal: General experience of comfort will improve Outcome: Progressing   Problem: Safety: Goal: Ability to remain free from injury will improve Outcome: Progressing   Problem: Safety: Goal: Ability to remain free from injury will improve Outcome: Progressing   Problem: Skin Integrity: Goal: Risk for impaired skin integrity will decrease Outcome: Progressing   Problem: Bowel/Gastric: Goal: Gastrointestinal status for postoperative course will improve Outcome: Progressing   Problem: Nutritional: Goal: Ability to achieve adequate nutritional intake will improve Outcome: Progressing   Problem: Clinical Measurements: Goal: Postoperative complications will be  avoided or minimized Outcome: Progressing   Problem: Respiratory: Goal: Ability to maintain a clear airway will improve Outcome: Progressing   Problem: Skin Integrity: Goal: Demonstration of wound healing without infection will improve Outcome: Progressing

## 2019-10-18 NOTE — Progress Notes (Signed)
Received a call from Radiology MD, Mr. Seibold asked the MD to write him a prescription for pain medication.  MD wanted to let me know that the Pt was asking for PRN pain medicine when Pt returned to the floor.

## 2019-10-18 NOTE — Progress Notes (Signed)
2 Days Post-Op Procedure(s) (LRB): ESOPHAGOGASTRODUODENOSCOPY (EGD) WITH PROPOFOL (N/A) BALLOON DILATION (N/A) Subjective: Says he ate about half of his puree lunch and dinner yesterday with no trouble. He has a sensation of a knot in his throat this morning and is unable to swallow his coffee.  He has not attempted to swallow any food today. Denies nausea or pain.   Objective: Vital signs in last 24 hours: Temp:  [97.4 F (36.3 C)-98.1 F (36.7 C)] 97.6 F (36.4 C) (01/07 0548) Pulse Rate:  [93-102] 93 (01/07 0548) Cardiac Rhythm: Normal sinus rhythm (01/07 0300) Resp:  [14-24] 14 (01/07 0548) BP: (83-123)/(66-94) 114/82 (01/07 0548) SpO2:  [96 %-98 %] 98 % (01/06 1932) Weight:  [71.5 kg] 71.5 kg (01/07 0548)     Intake/Output from previous day: 01/06 0701 - 01/07 0700 In: 534 [I.V.:60; NG/GT:474] Out: 1250 [Urine:1250] Intake/Output this shift: No intake/output data recorded.  Physical Exam: General appearance: alert, cooperative and mild distress Neurologic: intact Heart: regular rate and rhythm Lungs: Breath sounds are clear Abdomen: Soft and non-tender. G-J tube site OK Wound: chest tube sites and incision healing well.    Lab Results: Recent Labs    10/17/19 1030  WBC 6.9  HGB 8.8*  HCT 29.7*  PLT 328   BMET:  Recent Labs    10/17/19 1030  NA 137  K 4.5  CL 102  CO2 25  GLUCOSE 105*  BUN 15  CREATININE 0.52*  CALCIUM 9.2    PT/INR: No results for input(s): LABPROT, INR in the last 72 hours. ABG    Component Value Date/Time   PHART 7.432 09/14/2019 1405   HCO3 24.1 09/14/2019 1405   TCO2 28 09/04/2019 1241   ACIDBASEDEF 6.0 (H) 09/04/2019 0522   O2SAT 95.4 09/14/2019 1405   CBG (last 3)  No results for input(s): GLUCAP in the last 72 hours.  Assessment/Plan: S/P Procedure(s) (LRB): ESOPHAGOGASTRODUODENOSCOPY (EGD) WITH PROPOFOL (N/A) BALLOON DILATION (N/A)  -POD-44right thoracotomy, repair of esophogeal perforation with intercostal  muscle flap. -POD-22EGD with dilation of distal esophageal stricture to 73mm . -POD-17repeat EGD with dilation#2of distal esophageal stricture to 60mm. -POD-7EGD with dilation #3 to 26mm by Dr. Penelope Coop -POD2 EGD with dilation to 13.88mm by Dr. Therisa Doyne. Tolerated full liquids PO yesterday but says he is unable to swallow his coffee this morning, GI folllowing. We have transitioned to bolus feeding QID so he he can manage this himself as he has done in the past.   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  Discussed with general surgery again yesterday and they have concluded the mass is too large for them to safely manage and recommend referral to a tertiary center. Will contact [facility] colorectal service at [facility] and try to arrange outpatient follow up.   -CM teamhas beenworking on disposition. Currently the only option is discharge to a hotel room.by way of a charity program that will cover the costs temporarily.  Home health can be arranged to supply tube feeding.   -H/OEtOH abuse-Calm and cooperative.Continue Seroquel  -Mild anemia- Hctremainsstable.Monitor.  -Hypokalemia-Resolved.Monitor   LOS: 45 days    [name], [place] [phone] 10/18/2019

## 2019-10-18 NOTE — Progress Notes (Signed)
Chaplain engaged in follow-up visit with George Wade.  George Wade expressed his frustrations about his health and about the possibilities of living in a shelter.  Chaplain will continue to advocate on George Wade's behalf and find ways to assist him in his transition out of the hospital.   Chaplain will continue to follow-up.

## 2019-10-18 NOTE — H&P (View-Only) (Signed)
Subjective: Patient seen and examined at bedside. He keeps spitting up mucus and states he is not able to drink fluids since today morning.  Yesterday he had mechanical soft diet including chicken and peas without associated problems with swallowing.  However, from today morning, he states he has not been feeling well, unable to maintain his secretions and has been spitting mucoid fluid every time he tries to drink. He denies abdominal pain but complains of pain around the anal area.  He is requesting for IV Toradol as he states he cannot swallow tramadol.  Objective: Vital signs in last 24 hours: Temp:  [97.4 F (36.3 C)-98.1 F (36.7 C)] 98 F (36.7 C) (01/07 1138) Pulse Rate:  [93-111] 111 (01/07 1138) Resp:  [14-24] 18 (01/07 1138) BP: (109-123)/(81-94) 123/87 (01/07 1138) SpO2:  [97 %-98 %] 97 % (01/07 1138) Weight:  [71.5 kg] 71.5 kg (01/07 0548) Weight change: -0.3 kg Last BM Date: 10/17/19  PE: In obvious distress, keeps spitting mucoid fluid, unable to hold secretions GENERAL: Ill-appearing, prominent pallor ABDOMEN: Feeding tube in place, nondistended, nontender EXTREMITIES: No deformity  Lab Results: Results for orders placed or performed during the hospital encounter of 09/03/19 (from the past 48 hour(s))  CBC     Status: Abnormal   Collection Time: 10/17/19 10:30 AM  Result Value Ref Range   WBC 6.9 4.0 - 10.5 K/uL   RBC 3.57 (L) 4.22 - 5.81 MIL/uL   Hemoglobin 8.8 (L) 13.0 - 17.0 g/dL   HCT 29.7 (L) 39.0 - 52.0 %   MCV 83.2 80.0 - 100.0 fL   MCH 24.6 (L) 26.0 - 34.0 pg   MCHC 29.6 (L) 30.0 - 36.0 g/dL   RDW 15.6 (H) 11.5 - 15.5 %   Platelets 328 150 - 400 K/uL   nRBC 0.0 0.0 - 0.2 %    Comment: Performed at [facility], [address], [place], [place] [address]  Comprehensive metabolic panel     Status: Abnormal   Collection Time: 10/17/19 10:30 AM  Result Value Ref Range   Sodium 137 135 - 145 mmol/L   Potassium 4.5 3.5 - 5.1 mmol/L   Chloride 102  98 - 111 mmol/L   CO2 25 22 - 32 mmol/L   Glucose, Bld 105 (H) 70 - 99 mg/dL   BUN 15 8 - 23 mg/dL   Creatinine, Ser 0.52 (L) 0.61 - 1.24 mg/dL   Calcium 9.2 8.9 - 10.3 mg/dL   Total Protein 5.9 (L) 6.5 - 8.1 g/dL   Albumin 3.1 (L) 3.5 - 5.0 g/dL   AST 17 15 - 41 U/L   ALT 17 0 - 44 U/L   Alkaline Phosphatase 101 38 - 126 U/L   Total Bilirubin 0.3 0.3 - 1.2 mg/dL   GFR calc non Af Amer >60 >60 mL/min   GFR calc Af Amer >60 >60 mL/min   Anion gap 10 5 - 15    Comment: Performed at [facility] [address], [place], [place] [address]    Studies/Results: DG Chest 2 View  Result Date: 10/18/2019 CLINICAL DATA:  Postoperative evaluation. EXAM: CHEST - 2 VIEW COMPARISON:  10/04/2019.  09/28/2019. FINDINGS: Right PICC line in stable position. Mediastinum and hilar structures normal. Heart size stable. Slight progression of atelectatic changes right lung base. No prominent pleural effusion. No pneumothorax. Improved subcutaneous emphysema right neck and chest wall. Carotid vascular calcification. Postsurgical changes left upper quadrant. IMPRESSION: 1.  Right PICC line stable position.  2.  Slight progression of right base subsegmental atelectasis. 3. Interim improvement of subcutaneous emphysema in the right neck and chest wall. No pneumothorax. Electronically Signed   By: Marcello Moores  Register   On: 10/18/2019 06:38    Medications: I have reviewed the patient's current medications.  Assessment: Esophageal stricture, multiple EGDs and dilation, last performed in 10/16/2019, dilated up to 13.5 mm, asymptomatic until today morning  Esophageal perforation, status post thoracotomy Anal lesion-to be addressed as an outpatient  Plan: Patient had his Eliquis at 38 today. Plan on getting an esophagogram with Gastrografin. Hold tube feedings after midnight today. Keep patient n.p.o. Possible EGD in a.m. for further evaluation.   [name], MD 10/18/2019, 3:24 PM

## 2019-10-19 ENCOUNTER — Inpatient Hospital Stay (HOSPITAL_COMMUNITY): Payer: Self-pay | Admitting: Anesthesiology

## 2019-10-19 ENCOUNTER — Encounter (HOSPITAL_COMMUNITY): Payer: Self-pay | Admitting: Thoracic Surgery (Cardiothoracic Vascular Surgery)

## 2019-10-19 ENCOUNTER — Encounter (HOSPITAL_COMMUNITY)
Disposition: A | Discharge status: Skilled Nursing Facility | Payer: Self-pay | Source: Other Acute Inpatient Hospital | Attending: Thoracic Surgery (Cardiothoracic Vascular Surgery)

## 2019-10-19 HISTORY — PX: ESOPHAGOGASTRODUODENOSCOPY (EGD) WITH PROPOFOL: SHX5813

## 2019-10-19 HISTORY — PX: BALLOON DILATION: SHX5330

## 2019-10-19 SURGERY — ESOPHAGOGASTRODUODENOSCOPY (EGD) WITH PROPOFOL
Anesthesia: Monitor Anesthesia Care

## 2019-10-19 MED ORDER — PROPOFOL 500 MG/50ML IV EMUL
INTRAVENOUS | Status: DC | PRN
  Administered 2019-10-19: 75 ug/kg/min via INTRAVENOUS

## 2019-10-19 MED ORDER — BUTAMBEN-TETRACAINE-BENZOCAINE 2-2-14 % EX AERO
INHALATION_SPRAY | CUTANEOUS | Status: DC | PRN
  Administered 2019-10-19: 12:00:00 2 via TOPICAL

## 2019-10-19 MED ORDER — PROPOFOL 10 MG/ML IV BOLUS
INTRAVENOUS | Status: DC | PRN
  Administered 2019-10-19: 30 mg via INTRAVENOUS
  Administered 2019-10-19: 20 mg via INTRAVENOUS

## 2019-10-19 SURGICAL SUPPLY — 15 items

## 2019-10-19 NOTE — Anesthesia Preprocedure Evaluation (Addendum)
Anesthesia Evaluation  Patient identified by MRN, date of birth, ID band Patient awake    Reviewed: Allergy & Precautions, NPO status , Patient's Chart, lab work & pertinent test results  History of Anesthesia Complications Negative for: history of anesthetic complications  Airway Mallampati: II  TM Distance: >3 FB Neck ROM: Full    Dental  (+) Poor Dentition, Dental Advidsory Given   Pulmonary PE   Pulmonary exam normal        Cardiovascular hypertension, Normal cardiovascular exam     Neuro/Psych negative neurological ROS  negative psych ROS   GI/Hepatic (+)     substance abuse  alcohol use,  PEG Hx esophageal stricture Boerhave's syndrome S/p right thoracotomy, repair of esophogeal perforation with intercostal muscle flap    Endo/Other  negative endocrine ROS  Renal/GU negative Renal ROS     Musculoskeletal negative musculoskeletal ROS (+)   Abdominal   Peds  Hematology  (+) anemia ,  Non-Hodgkins lymphoma    Anesthesia Other Findings Hx tonsillar cancer  Reproductive/Obstetrics                             Anesthesia Physical  Anesthesia Plan  ASA: III  Anesthesia Plan: MAC   Post-op Pain Management:    Induction: Intravenous  PONV Risk Score and Plan: 1 and Propofol infusion and Treatment may vary due to age or medical condition  Airway Management Planned: Nasal Cannula and Natural Airway  Additional Equipment: None  Intra-op Plan:   Post-operative Plan:   Informed Consent: I have reviewed the patients History and Physical, chart, labs and discussed the procedure including the risks, benefits and alternatives for the proposed anesthesia with the patient or authorized representative who has indicated his/her understanding and acceptance.     Dental advisory given  Plan Discussed with: CRNA and Anesthesiologist  Anesthesia Plan Comments:          Anesthesia Quick Evaluation

## 2019-10-19 NOTE — Progress Notes (Addendum)
Day of Surgery Procedure(s) (LRB): ESOPHAGOGASTRODUODENOSCOPY (EGD) WITH PROPOFOL (N/A) BALLOON DILATION (N/A) Subjective: Says he feels "washed out" after the EGD / dilation earlier today. Denies pain. He is tolerating the bolus feeding with no trouble.   Objective: Vital signs in last 24 hours: Temp:  [97.3 F (36.3 C)-98.2 F (36.8 C)] 98 F (36.7 C) (01/08 1356) Pulse Rate:  [74-94] 78 (01/08 1335) Cardiac Rhythm: Normal sinus rhythm (01/08 1356) Resp:  [12-19] 17 (01/08 1356) BP: (94-127)/(69-98) 120/92 (01/08 1356) SpO2:  [97 %-99 %] 97 % (01/08 1356) Weight:  [70.1 kg] 70.1 kg (01/08 0500)    Intake/Output from previous day: 01/07 0701 - 01/08 0700 In: 30 [I.V.:30] Out: 300 [Urine:300] Intake/Output this shift: Total I/O In: 400 [I.V.:400] Out: -   Physical Exam: General appearance:alert, cooperative and mild distress Neurologic:intact Heart:regular rate and rhythm Lungs:Breath sounds are clear Abdomen:was not re-examined today Wound:not re-examined today  Lab Results: Recent Labs    10/17/19 1030  WBC 6.9  HGB 8.8*  HCT 29.7*  PLT 328   BMET:  Recent Labs    10/17/19 1030  NA 137  K 4.5  CL 102  CO2 25  GLUCOSE 105*  BUN 15  CREATININE 0.52*  CALCIUM 9.2    PT/INR: No results for input(s): LABPROT, INR in the last 72 hours. ABG    Component Value Date/Time   PHART 7.432 09/14/2019 1405   HCO3 24.1 09/14/2019 1405   TCO2 28 09/04/2019 1241   ACIDBASEDEF 6.0 (H) 09/04/2019 0522   O2SAT 95.4 09/14/2019 1405   CBG (last 3)  No results for input(s): GLUCAP in the last 72 hours.  Assessment/Plan: S/P Procedure(s) (LRB): ESOPHAGOGASTRODUODENOSCOPY (EGD) WITH PROPOFOL (N/A) BALLOON DILATION (N/A)  -POD-45right thoracotomy, repair of esophogeal perforation with intercostal muscle flap. -POD-23EGD with dilation of distal esophageal stricture to 53mm . -POD-18repeat EGD with dilation#2of distal esophageal stricture to  77mm. -POD-8EGD with dilation #3 to 36mm by Dr. Penelope Coop -POD3 EGD with dilation to 13.64mm by Dr. Therisa Doyne. -EGD with dilation to 46mm today by Dr. Therisa Doyne.   Appreciate assistance from GI service.  OK to resume mechanical soft diet per GI. We have transitioned to bolus feeding QID so he he can manage this himself as he has done in the past.   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 Discussed with general surgery again yesterday and they have concluded the mass is too large for them to safely manage and recommend referral to a tertiary center. I have contact the colorectal service at [facility] and now awaiting instructions for outpatient follow up.   -CM teamhas beenworking on disposition. Currently the only option is discharge to a hotel room.by way of a charity program that will cover the costs temporarily.  Home health has been arranged to supply tube feeding.   -H/OEtOH abuse-Calm and cooperative.  -Mild anemia- Hctremainsstable.Monitor.Recheck lab tomorrow.   LOS: 57 days    [name], [place] [phone] 10/19/2019 Patient seen and examined, agree with above Feeling better this evening  [name], MD [facility] [phone]

## 2019-10-19 NOTE — Op Note (Addendum)
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 Estimated blood loss:                            Minimal. Estimated Blood Loss:     Estimated blood loss was minimal. Procedure:                Pre-Anesthesia Assessment:                           - Prior to the procedure, a History and Physical                            was performed, and patient medications and                            allergies were reviewed. The patient's tolerance of                            previous anesthesia was also reviewed. The risks                            and benefits of the procedure and the sedation                            options and risks were discussed with the patient.                            All questions were answered, and informed consent                            was obtained. Prior Anticoagulants: The patient has                            taken  Eliquis (apixaban), last dose was 1 day prior                            to procedure. ASA Grade Assessment: III - A patient                            with severe systemic disease. After reviewing the                            risks and benefits, the patient was deemed in                            satisfactory condition to undergo the procedure.                        After obtaining informed consent, the endoscope was                            passed under direct vision. Throughout the                            procedure, the patient's blood pressure, pulse, and                            oxygen saturations were monitored continuously. The                            GIF-H190 ([id number]) Olympus gastroscope was                            introduced through the mouth, and advanced to the                            second part of duodenum. The upper GI endoscopy was                            accomplished without difficulty. The patient                            tolerated the procedure well. The GIF-H190                            ([id number]) Olympus gastroscope was introduced                            through the and advanced to the. Scope In: Scope Out: Findings:      One benign-appearing, intrinsic severe (stenosis; an endoscope cannot       pass) stenosis was found 33 to 35 cm from the incisors. The stenosis was       traversed after dilation to 12 mm. A TTS dilator was passed through the       scope. Dilation with a 12-13.5-15 mm x 5.5 cm CRE balloon dilator was       performed from 12 mm for a minute to 13.5  mm for a minute and finally up       to 15 mm for a minute. The dilation site was examined following       endoscope reinsertion and showed moderate mucosal disruption and       complete resolution of luminal narrowing.      An endoclip was found in the gastric body.      There was evidence of an intact gastrostomy with a patent G-tube present       in the gastric body. This was characterized by healthy appearing mucosa.      The duodenal bulb, first portion of the duodenum and second portion of       the duodenum were normal. The tip was noted beyond the second portion of       duodenum.      The cardia and gastric fundus were normal on retroflexion. Impression:               - Benign-appearing esophageal stenosis.  Dilated.                           - An endoclip was found in the stomach.                           - Intact gastrostomy with a patent G-tube present                            characterized by healthy appearing mucosa.                           - Normal duodenal bulb, first portion of the                            duodenum and second portion of the duodenum.                           - No specimens collected. Moderate Sedation:      Patient did not receive moderate sedation for this procedure, but       instead received monitored anesthesia care. Recommendation:           - Mechanical soft diet.                           - Continue present medications.                           - Resume Eliquis (apixaban) at prior dose tomorrow.                           - Repeat upper endoscopy at appointment to be                            scheduled for retreatment. Procedure Code(s):        --- Professional ---                           [phone], Esophagogastroduodenoscopy, flexible,  transoral; with transendoscopic balloon dilation of                            esophagus (less than 30 mm diameter) Diagnosis Code(s):        --- Professional ---                           K22.2, Esophageal obstruction                           T18.2XXA, Foreign body in stomach, initial encounter                           Z93.1, Gastrostomy status                           R13.10, Dysphagia, unspecified CPT copyright 2019 American Medical Association. All rights reserved. The codes documented in this report are preliminary and upon coder review may  be revised to meet current compliance requirements. [name], MD 10/19/2019 1:04:54 PM This report has been signed electronically. Number of Addenda: 0

## 2019-10-19 NOTE — Transfer of Care (Signed)
Immediate Anesthesia Transfer of Care Note  Patient: George Wade.  Procedure(s) Performed: ESOPHAGOGASTRODUODENOSCOPY (EGD) WITH PROPOFOL (N/A ) BALLOON DILATION (N/A )  Patient Location: PACU  Anesthesia Type:MAC  Level of Consciousness: awake, alert , oriented and patient cooperative  Airway & Oxygen Therapy: Patient Spontanous Breathing and Patient connected to nasal cannula oxygen  Post-op Assessment: Report given to RN and Post -op Vital signs reviewed and stable  Post vital signs: Reviewed  Last Vitals:  Vitals Value Taken Time  BP    Temp    Pulse    Resp    SpO2      Last Pain:  Vitals:   10/19/19 1121  TempSrc: Oral  PainSc: 0-No pain      Patients Stated Pain Goal: 0 (50/09/38 1829)  Complications: No apparent anesthesia complications

## 2019-10-19 NOTE — Op Note (Signed)
EGD was performed for dysphagia, dilation of known esophageal stricture, prior history of esophageal rupture requiring thoracotomy.   Findings: Benign intrinsic stricture found from 33 cm from incisors.  Scope was advanced into the gastric cavity after 12 mm balloon dilation. Thereafter, strictured area was dilated with 13.5 mm and eventually 15 mm balloon, for 1 minute each. Small hiatal hernia noted. Previously placed Endo Clip noted in proximal gastric body. Gastrostomy tube noted in good position, underlying mucosa appeared unremarkable. Normal-appearing cardia and fundus. The tip of the gastrojejunostomy tube was noted beyond the duodenal bulb, first and second portion of the duodenum.  The patient tolerated the procedure well.   Recommendations: Resume Eliquis tomorrow if there is no evidence of bleeding. Mechanical soft diet. Patient instructed to take small bites and chew his food well. He will likely need several sessions of dilation, can be arranged as an outpatient.  [name], MD

## 2019-10-19 NOTE — Interval H&P Note (Signed)
History and Physical Interval Note: 63/male with history of esophageal perforation, s/o thoracotomy and repair, persistent esophageal stricture, multiple balloon dilations, unable to hold his secretions, distal esophageal narrowing on gastrograffin esophagogram yesterday, for an EGD today, last dose of Eliquis was yesterday at 11 am.  10/19/2019 11:51 AM  George Wade.  has presented today for EGD with possible dilation, with the diagnosis of Dysphagia.  The various methods of treatment have been discussed with the patient and family. After consideration of risks, benefits and other options for treatment, the patient has consented to  Procedure(s): ESOPHAGOGASTRODUODENOSCOPY (EGD) WITH PROPOFOL (N/A) as a surgical intervention.  The patient's history has been reviewed, patient examined, no change in status, stable for surgery.  I have reviewed the patient's chart and labs.  Questions were answered to the patient's satisfaction.     [name]

## 2019-10-19 NOTE — Anesthesia Procedure Notes (Signed)
Procedure Name: MAC Date/Time: 10/19/2019 12:25 PM Performed by: Jenne Campus, CRNA Pre-anesthesia Checklist: Patient identified, Emergency Drugs available, Suction available and Patient being monitored Oxygen Delivery Method: Nasal cannula

## 2019-10-19 NOTE — Brief Op Note (Signed)
09/03/2019 - 10/19/2019  1:05 PM  PATIENT:  George Wade.  64 y.o. male  PRE-OPERATIVE DIAGNOSIS:  Dysphagia  POST-OPERATIVE DIAGNOSIS:  Esophageal balloon dilation   PROCEDURE:  Procedure(s): ESOPHAGOGASTRODUODENOSCOPY (EGD) WITH PROPOFOL (N/A) BALLOON DILATION (N/A)  SURGEON:  Surgeon(s) and Role:    [name], MD - Primary  PHYSICIAN ASSISTANT:   ASSISTANTS:Zharia Burton,Rn, Lovenia Shuck, Tech   ANESTHESIA:   MAC  EBL:  Minimal  BLOOD ADMINISTERED:none  DRAINS: none   LOCAL MEDICATIONS USED:  NONE  SPECIMEN:  No Specimen  DISPOSITION OF SPECIMEN:  N/A  COUNTS:  YES  TOURNIQUET:  * No tourniquets in log *  DICTATION: .Dragon Dictation  PLAN OF CARE: Admit to inpatient   PATIENT DISPOSITION:  PACU - hemodynamically stable.   Delay start of Pharmacological VTE agent (>24hrs) due to surgical blood loss or risk of bleeding: yes

## 2019-10-19 NOTE — Anesthesia Postprocedure Evaluation (Signed)
Anesthesia Post Note  Patient: George Wade.  Procedure(s) Performed: ESOPHAGOGASTRODUODENOSCOPY (EGD) WITH PROPOFOL (N/A ) BALLOON DILATION (N/A )     Patient location during evaluation: Endoscopy Anesthesia Type: MAC Level of consciousness: awake and alert Pain management: pain level controlled Vital Signs Assessment: post-procedure vital signs reviewed and stable Respiratory status: spontaneous breathing, nonlabored ventilation and respiratory function stable Cardiovascular status: stable and blood pressure returned to baseline Postop Assessment: no apparent nausea or vomiting Anesthetic complications: no    Last Vitals:  Vitals:   10/19/19 1320 10/19/19 1335  BP: 94/76 125/79  Pulse: 76 78  Resp: 18 19  Temp:    SpO2: 99% 97%    Last Pain:  Vitals:   10/19/19 1335  TempSrc:   PainSc: 0-No pain                 Catalina Gravel

## 2019-10-20 LAB — CBC
HCT: 28.1 % — ABNORMAL LOW (ref 39.0–52.0)
Hemoglobin: 8.5 g/dL — ABNORMAL LOW (ref 13.0–17.0)
MCH: 24.7 pg — ABNORMAL LOW (ref 26.0–34.0)
MCHC: 30.2 g/dL (ref 30.0–36.0)
MCV: 81.7 fL (ref 80.0–100.0)
Platelets: 286 10*3/uL (ref 150–400)
RBC: 3.44 MIL/uL — ABNORMAL LOW (ref 4.22–5.81)
RDW: 15.5 % (ref 11.5–15.5)
WBC: 5.7 10*3/uL (ref 4.0–10.5)
nRBC: 0 % (ref 0.0–0.2)

## 2019-10-20 LAB — BASIC METABOLIC PANEL
Anion gap: 8 (ref 5–15)
BUN: 8 mg/dL (ref 8–23)
CO2: 26 mmol/L (ref 22–32)
Calcium: 9.5 mg/dL (ref 8.9–10.3)
Chloride: 103 mmol/L (ref 98–111)
Creatinine, Ser: 0.55 mg/dL — ABNORMAL LOW (ref 0.61–1.24)
GFR calc Af Amer: >60 mL/min (ref >60)
GFR calc non Af Amer: >60 mL/min (ref >60)
Glucose, Bld: 89 mg/dL (ref 70–99)
Potassium: 3.7 mmol/L (ref 3.5–5.1)
Sodium: 137 mmol/L (ref 135–145)

## 2019-10-20 MED ORDER — LACTATED RINGERS IV BOLUS
500.0000 mL | Freq: Once | INTRAVENOUS | Status: AC
  Administered 2019-10-20: 500 mL via INTRAVENOUS

## 2019-10-20 MED ORDER — APIXABAN 5 MG PO TABS
5.0000 mg | ORAL_TABLET | Freq: Two times a day (BID) | ORAL | Status: DC
  Administered 2019-10-20 – 2019-10-21 (×4): 5 mg via ORAL
  Filled 2019-10-20 (×9): qty 1

## 2019-10-20 MED ORDER — LOPERAMIDE HCL 1 MG/7.5ML PO SUSP
2.0000 mg | ORAL | Status: DC | PRN
  Administered 2019-10-24 – 2019-11-03 (×20): 2 mg
  Filled 2019-10-20 (×30): qty 15

## 2019-10-20 MED ORDER — POTASSIUM CHLORIDE 20 MEQ/15ML (10%) PO SOLN
20.0000 meq | Freq: Once | ORAL | Status: AC
  Administered 2019-10-20: 20 meq
  Filled 2019-10-20: qty 15

## 2019-10-20 NOTE — Progress Notes (Signed)
Patient refused Osmolite 1.5 cal QID because it caused diarrhea last night. No diarrhea today. CVTS team & GI team made aware this matter. Continue to monitor it. Patient BP was soft in the morning today, patient received LR 500 ml bolus once and BP was improved. HS [employer]

## 2019-10-20 NOTE — Progress Notes (Signed)
Meds taken PO. Tolerated well

## 2019-10-20 NOTE — Progress Notes (Addendum)
[facility] Gastroenterology Progress Note  George Wade. 64 y.o. [birth year]   Subjective: Felt like food hung up this morning which frustrated him. Diarrhea yesterday but denies any today. Nurse in room.   Objective: Vital signs: Vitals:   10/20/19 1139 10/20/19 1220  BP: 98/84   Pulse:    Resp: 16 18  Temp: 98.1 F (36.7 C)   SpO2: 97%   P 92  Physical Exam: Gen: lethargic, no acute distress, elderly, thin HEENT: anicteric sclera CV: RRR Chest: CTA B Abd: epigastric tenderness with guarding, soft, nondistended, +BS, PEG tube  Ext: + LE edema  Lab Results: Recent Labs    10/20/19 0500  NA 137  K 3.7  CL 103  CO2 26  GLUCOSE 89  BUN 8  CREATININE 0.55*  CALCIUM 9.5   No results for input(s): AST, ALT, ALKPHOS, BILITOT, PROT, ALBUMIN in the last 72 hours. Recent Labs    10/20/19 0500  WBC 5.7  HGB 8.5*  HCT 28.1*  MCV 81.7  PLT 286      Assessment/Plan: Esophageal perforation with repair in late November. Esophageal stricture - s/p serial dilations last on 10/19/19 up to 15 mm TTS balloon (Dr. Therisa Doyne). Feels that food is still hanging up but dilated to 15 mm so should be able to tolerate solid food that is chewed well in small bites and eaten slowly. Tried to reassure. Hgb 8.5 (8.8). Continue PPI IV Q 12 hours. Continue PEG tube feeds. Resume Eliquis. Supportive care. Will f/u.   George Wade 10/20/2019, 1:23 PM  Questions please call [phone] ID: George Wade., male   DOB: [birth year], 64 y.o.   MRN: [id number]

## 2019-10-20 NOTE — Progress Notes (Addendum)
      [address]       ,[place] [address]             [phone]       1 Day Post-Op Procedure(s) (LRB): ESOPHAGOGASTRODUODENOSCOPY (EGD) WITH PROPOFOL (N/A) BALLOON DILATION (N/A)  Subjective: Patient with diarrhea and refused tube feeding .  Objective: Vital signs in last 24 hours: Temp:  [97.3 F (36.3 C)-98.2 F (36.8 C)] 98.2 F (36.8 C) (01/08 2356) Pulse Rate:  [76-106] 93 (01/08 2356) Cardiac Rhythm: Normal sinus rhythm (01/09 0700) Resp:  [12-19] 17 (01/08 2356) BP: (94-145)/(70-98) 145/98 (01/08 2356) SpO2:  [97 %-99 %] 98 % (01/08 2356) Weight:  [69.1 kg] 69.1 kg (01/09 0604)      Intake/Output from previous day: 01/08 0701 - 01/09 0700 In: 400 [I.V.:400] Out: -    Physical Exam:  Cardiovascular: RRR Pulmonary: Clear to auscultation bilaterally Abdomen: Soft, bowel sounds present. Extremities: No lower extremity edema. Wounds: Clean and dry.  No erythema or signs of infection.   Lab Results: CBC: Recent Labs    10/17/19 1030 10/20/19 0500  WBC 6.9 5.7  HGB 8.8* 8.5*  HCT 29.7* 28.1*  PLT 328 286   BMET:  Recent Labs    10/17/19 1030 10/20/19 0500  NA 137 137  K 4.5 3.7  CL 102 103  CO2 25 26  GLUCOSE 105* 89  BUN 15 8  CREATININE 0.52* 0.55*  CALCIUM 9.2 9.5    PT/INR: No results for input(s): LABPROT, INR in the last 72 hours. ABG:  INR: Will add last result for INR, ABG once components are confirmed Will add last 4 CBG results once components are confirmed  Assessment/Plan:  1. CV - SR but hypotensive this am. Will give fluid bolus and montior 2.  POD-46right thoracotomy, repair of esophogeal perforation with intercostal muscle flap. -POD-24EGD with dilation of distal esophageal stricture to 21mm . -POD-19repeat EGD with dilation#2of distal esophageal stricture to 36mm. -POD 1 with dilation to 24mm  by Dr. Therisa Doyne. 3. 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  Colorectal service at [facility] contacted and now awaiting instructions for outpatient follow up. 4. Regarding discharge disposition, the only option is discharge to a hotel room.by way of a charity program that will cover the costs temporarily. Home health has been arranged to supply tube feeding. 5. History of ETOH abuse 6. Anemia-H and H this am slightly decreased to 8.5 and 28.1 7. Supplement potassium via tube 8. GI-on bolus tube feedings. Imodium PRN diarrhea  Donielle M ZimmermanPA-C 10/20/2019,9:45 AM   Agree with above Lajuana Matte

## 2019-10-20 NOTE — Plan of Care (Signed)
  Problem: Education: Goal: Knowledge of General Education information will improve Description: Including pain rating scale, medication(s)/side effects and non-pharmacologic comfort measures Outcome: Progressing   Problem: Health Behavior/Discharge Planning: Goal: Ability to manage health-related needs will improve Outcome: Progressing   Problem: Clinical Measurements: Goal: Ability to maintain clinical measurements within normal limits will improve Outcome: Progressing Goal: Will remain free from infection Outcome: Progressing Goal: Diagnostic test results will improve Outcome: Progressing Goal: Respiratory complications will improve Outcome: Progressing Goal: Cardiovascular complication will be avoided Outcome: Progressing   Problem: Activity: Goal: Risk for activity intolerance will decrease Outcome: Progressing   Problem: Nutrition: Goal: Adequate nutrition will be maintained Outcome: Progressing   Problem: Coping: Goal: Level of anxiety will decrease Outcome: Progressing   Problem: Elimination: Goal: Will not experience complications related to bowel motility Outcome: Progressing Goal: Will not experience complications related to urinary retention Outcome: Progressing   Problem: Pain Managment: Goal: General experience of comfort will improve Outcome: Progressing   Problem: Safety: Goal: Ability to remain free from injury will improve Outcome: Progressing   Problem: Skin Integrity: Goal: Risk for impaired skin integrity will decrease Outcome: Progressing   Problem: Bowel/Gastric: Goal: Gastrointestinal status for postoperative course will improve Outcome: Progressing   Problem: Nutritional: Goal: Ability to achieve adequate nutritional intake will improve Outcome: Progressing   Problem: Clinical Measurements: Goal: Postoperative complications will be avoided or minimized Outcome: Progressing   Problem: Respiratory: Goal: Ability to maintain a  clear airway will improve Outcome: Progressing   Problem: Skin Integrity: Goal: Demonstration of wound healing without infection will improve Outcome: Progressing

## 2019-10-21 NOTE — Progress Notes (Signed)
[facility] Gastroenterology Progress Note  George Wade. 64 y.o. 1955/10/27   Subjective: Reports vomiting while eating lunch. Felt tightness in his throat when he first took a swallow of food so he stopped eating but when he tried to eat again food came back up when he tried to swallow. Has epigastric pain.  Objective: Vital signs: Vitals:   10/21/19 0511 10/21/19 0744  BP: 123/83 (!) 89/62  Pulse: 95   Resp: 13 13  Temp: 98 F (36.7 C) 98.3 F (36.8 C)  SpO2: 98% 96%    Physical Exam: Gen: alert, no acute distress, elderly, thin HEENT: anicteric sclera CV: RRR Chest: CTA B Abd: epigastric tenderness with minimal guariding, soft, nondistended, +BS, PEG tube Ext: trace LE edema  Lab Results: Recent Labs    10/20/19 0500  NA 137  K 3.7  CL 103  CO2 26  GLUCOSE 89  BUN 8  CREATININE 0.55*  CALCIUM 9.5   No results for input(s): AST, ALT, ALKPHOS, BILITOT, PROT, ALBUMIN in the last 72 hours. Recent Labs    10/20/19 0500  WBC 5.7  HGB 8.5*  HCT 28.1*  MCV 81.7  PLT 286      Assessment/Plan: Esophageal perforation with repair in 08/2019. Recurrent esophageal dilations for esophageal stricture last on 10/19/19 with dilation to 15 mm with a TTS balloon by Dr. Therisa Doyne. Since that dilation he feels that food regurgitates back up everytime he eats. Continue IV PPI Q 12 hours. Refused PEG tube feedings because of diarrhea. Solid stool yesterday. Changed diet to clear liquid diet and NPO p MN. Repeat barium swallow ordered to re-evaluate his stricture. [facility] will f/u after barium swallow.   Lear Ng 10/21/2019, 3:52 PM  Questions please call [phone] ID: George Wade., male   DOB: 1956/08/19, 64 y.o.   MRN: [id number]

## 2019-10-21 NOTE — Progress Notes (Addendum)
      [address]       [place] [address]             [phone]       2 Days Post-Op Procedure(s) (LRB): ESOPHAGOGASTRODUODENOSCOPY (EGD) WITH PROPOFOL (N/A) BALLOON DILATION (N/A)  Subjective: Patient with no further diarrhea. He states he has abdominal crampy-like pain over mid abdoment  Objective: Vital signs in last 24 hours: Temp:  [98 F (36.7 C)-98.7 F (37.1 C)] 98.3 F (36.8 C) (01/10 0744) Pulse Rate:  [43-99] 95 (01/10 0511) Cardiac Rhythm: Normal sinus rhythm (01/10 0744) Resp:  [11-25] 13 (01/10 0744) BP: (82-133)/(61-98) 89/62 (01/10 0744) SpO2:  [96 %-98 %] 96 % (01/10 0744) Weight:  [71.5 kg] 71.5 kg (01/10 0511)      Intake/Output from previous day: 01/09 0701 - 01/10 0700 In: 9038 [P.O.:1126; I.V.:20; IV Piggyback:500] Out: 1850 [Urine:1850]   Physical Exam:  Cardiovascular: RRR Pulmonary: Clear to auscultation bilaterally Abdomen: Soft, bowel sounds present, some diffuse tenderness with deep palpation across mid abdomen Extremities: No lower extremity edema. Wounds: Clean and dry.  No erythema or signs of infection.   Lab Results: CBC: Recent Labs    10/20/19 0500  WBC 5.7  HGB 8.5*  HCT 28.1*  PLT 286   BMET:  Recent Labs    10/20/19 0500  NA 137  K 3.7  CL 103  CO2 26  GLUCOSE 89  BUN 8  CREATININE 0.55*  CALCIUM 9.5    PT/INR: No results for input(s): LABPROT, INR in the last 72 hours. ABG:  INR: Will add last result for INR, ABG once components are confirmed Will add last 4 CBG results once components are confirmed  Assessment/Plan:  1. CV - SR 2.  POD-47right thoracotomy, repair of esophogeal perforation with intercostal muscle flap. -POD-25EGD with dilation of distal esophageal stricture to 74mm . -POD-20repeat EGD with dilation#2of distal esophageal stricture to 46mm. -POD 2 with dilation to 58mm  by Dr. Therisa Doyne. 3. 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  Colorectal service at [facility] contacted and now awaiting instructions for outpatient follow up. 4. Regarding discharge disposition, the only option is discharge to a hotel room.by way of a charity program that will cover the costs temporarily. Home health has been arranged to supply tube feeding. 5. History of ETOH abuse 6. Anemia-H and H this am slightly decreased to 8.5 and 28.1 7. Supplement potassium via tube 8. GI-on bolus tube feedings, dysphagia I diet. GI following  Donielle M ZimmermanPA-C 10/21/2019,9:05 AM  Agree with above dispo planning  [place]

## 2019-10-22 ENCOUNTER — Inpatient Hospital Stay (HOSPITAL_COMMUNITY): Payer: Self-pay

## 2019-10-22 MED ORDER — SODIUM CHLORIDE 0.9 % IV SOLN: INTRAVENOUS | Status: DC

## 2019-10-22 MED ORDER — FERROUS SULFATE 75 (15 FE) MG/ML PO SOLN
15.0000 mg | Freq: Three times a day (TID) | ORAL | Status: DC
  Filled 2019-10-22 (×3): qty 1

## 2019-10-22 MED ORDER — IOHEXOL 300 MG/ML  SOLN
150.0000 mL | Freq: Once | INTRAMUSCULAR | Status: AC | PRN
  Administered 2019-10-22: 150 mL via ORAL

## 2019-10-22 MED ORDER — FERROUS SULFATE 300 (60 FE) MG/5ML PO SYRP
300.0000 mg | ORAL_SOLUTION | Freq: Three times a day (TID) | ORAL | Status: DC
  Administered 2019-10-22 – 2019-11-02 (×31): 300 mg
  Filled 2019-10-22 (×37): qty 5

## 2019-10-22 NOTE — Progress Notes (Signed)
   Esophageal stricture G tube originally placed in IR 08/06/19  Inadvertently came out and replaced 08/17/19 with balloon retention G tube Esophageal rupture and repair Exchanged for balloon retention GJ 11/25-- to minimize risk to repaired esophagus  Now for exchange back to G tube per MD  Note 1/11 CTS: Had more difficulty swallowing over the weekend, Barium swallow this AM shows persistent lon-segment narrowing in the distal esophagus ane a "subtle area of added density seen along the left posterolateral margin of the mid esophagus without clear signs of leak". We have transitioned to bolus feeding QID and he seems to be tolerating this well.  Will ask IR to change out dual lumen G-J tube for a G-tube.   Now scheduled for exchange to Gastric tube in IR 1/12  Pt is agreeable to move ahead Consent signed in chart

## 2019-10-22 NOTE — Plan of Care (Signed)
Discussed POC with pt numerus times today, reinforcing MDs orders with pt over diet, meds and procedures.  Continue POC.

## 2019-10-22 NOTE — Progress Notes (Signed)
3 Days Post-Op Procedure(s) (LRB): ESOPHAGOGASTRODUODENOSCOPY (EGD) WITH PROPOFOL (N/A) BALLOON DILATION (N/A) Subjective: Returned to his room after barium swallow this AM for evaluation of epigastric discomfort and vomiting again while trying to eat over the weekend. He has had some liquids this morning that he is tolerating.  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 Also reports bleeding with every bowel movement and bleeding in smaller amounts between bowel movements.   Objective: Vital signs in last 24 hours: Temp:  [97.5 F (36.4 C)-98.3 F (36.8 C)] 98.1 F (36.7 C) (01/11 0722) Pulse Rate:  [81-103] 92 (01/11 0722) Cardiac Rhythm: Normal sinus rhythm (01/11 0700) Resp:  [12-20] 20 (01/11 0722) BP: (96-137)/(68-96) 96/68 (01/11 0722) SpO2:  [96 %-98 %] 97 % (01/11 0722) Weight:  [70.8 kg] 70.8 kg (01/11 0528)   Intake/Output from previous day: 01/10 0701 - 01/11 0700 In: 680 [P.O.:640; I.V.:40] Out: 1150 [Urine:1150] Intake/Output this shift: No intake/output data recorded.  General appearance: alert, cooperative and mild distress Neurologic: intact Heart: NSR Abdomen: soft and non-tender. Wound: The chest incision and CT sites are well healed.   Lab Results: Recent Labs    10/20/19 0500  WBC 5.7  HGB 8.5*  HCT 28.1*  PLT 286   BMET:  Recent Labs    10/20/19 0500  NA 137  K 3.7  CL 103  CO2 26  GLUCOSE 89  BUN 8  CREATININE 0.55*  CALCIUM 9.5    PT/INR: No results for input(s): LABPROT, INR in the last 72 hours. ABG    Component Value Date/Time   PHART 7.432 09/14/2019 1405   HCO3 24.1 09/14/2019 1405   TCO2 28 09/04/2019 1241   ACIDBASEDEF 6.0 (H) 09/04/2019 0522   O2SAT 95.4 09/14/2019 1405    ESOPHOGRAM/BARIUM SWALLOW 10/22/19  TECHNIQUE: Single contrast examination was performed using water-soluble and thin barium contrast material.  FLUOROSCOPY TIME:  Fluoroscopy Time:  3 minutes 30 seconds  Radiation  Exposure Index (if provided by the fluoroscopic device): 25.5 mGy  Number of Acquired Spot Images: 6  COMPARISON:  Multiple priors most recent, sonogram from 10/18/2019  FINDINGS: Scout image was obtained. Initial swallow all of water-soluble contrast was performed again showing narrowing of the distal esophagus but without the degree of dilation seen proximal to this area on previous studies. There is mucosal irregularity at the level of narrowing with shelf-like narrowing in the proximal portion. No leak was demonstrated with water-soluble contrast in bilateral oblique projections and left lateral projection or in the AP projection.  Swallowing of thin barium contrast was then performed in left and right oblique projections and near left lateral projection. Subtle added density with the barium contrast was seen along the left posterolateral margin. Is unclear whether this represented artifact and no clear sign of leak could be visualized on subsequent images.  IMPRESSION: 1. Subtle area of added density seen along the left posterolateral margin of the mid esophagus without clear signs of leak. Given patient history, CT of the chest may be helpful to exclude a tiny occult leak. 2. Persistent narrowing of the esophagus, long segment beginning in the junction of middle and distal third as described 3. A call is out to the referring provider to further discuss findings in the above case.   Electronically Signed   By: Zetta Bills M.D.   On: 10/22/2019 09:15   Assessment/Plan: S/P Procedure(s) (LRB): ESOPHAGOGASTRODUODENOSCOPY (EGD) WITH PROPOFOL (N/A) BALLOON DILATION (N/A)  -POD-48right thoracotomy, repair of esophogeal perforation with intercostal  muscle flap. -POD-26EGD with dilation of distal esophageal stricture to 37mm . -POD-121repeat EGD with dilation#2of distal esophageal stricture to 7mm. -POD-8EGD with dilation #3 to 83mm by Dr.  Penelope Coop -POD-6EGD with dilation #4 to 13.49mm by Dr. Therisa Doyne. -EGD-3 EGD with dilation #5 to 52mm today by Dr. Therisa Doyne.   Appreciate assistance from GI service.  Had more difficulty swallowing over the weekend, Barium swallow this AM shows persistent lon-segment narrowing in the distal esophagus ane a "subtle area of added density seen along the left posterolateral margin of the mid esophagus without clear signs of leak". We have transitioned to bolus feeding QID and he seems to be tolerating this well.  Will ask IR to change out dual lumen G-J tube for a G-tube.   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 general surgeryagain concluded the mass is too large for them to safely manage and recommend referral to a tertiary center. I have contact the colorectal service at [facility] and now awaiting instructions for outpatient follow up.This continues to cause a lot of discomfort and I suspect is the source for his on-going anemia.   -CM teamhas beenworking on disposition. Currentlythe only option is discharge to a hotel room.by way of a charity program that will cover the costs temporarily. Home health has been arranged to supply tube feeding.   -H/OEtOH abuse-Calm and cooperative.  -Mild anemia- Hctremainsstable.Monitor.Recheck lab tomorrow.    LOS: 49 days    [name], [place] [phone] 10/22/2019

## 2019-10-22 NOTE — Progress Notes (Signed)
[facility] Progress Note  Subjective: The patient feels about the same from a swallowing standpoint.  He does not feel like he can swallow solid foods adequately.  He had a barium swallow today which showed a subtle area of added density along the left posterolateral margin of the midesophagus without a clear sign of leak however I spoke to the radiologist and he suggested just to make sure to get a CT scan of the chest which I have ordered.  There is persistent narrowing of the esophagus as described on the report.  This is despite multiple endoscopic dilatations.  Objective: Vital signs in last 24 hours: Temp:  [97.5 F (36.4 C)-98.3 F (36.8 C)] 98.2 F (36.8 C) (01/11 1111) Pulse Rate:  [79-103] 79 (01/11 1111) Resp:  [12-20] 16 (01/11 1111) BP: (93-137)/(68-96) 93/74 (01/11 1111) SpO2:  [96 %-98 %] 97 % (01/11 1111) Weight:  [70.8 kg] 70.8 kg (01/11 0528) Weight change: -0.7 kg   PE:  No distress  Lab Results: No results found for this or any previous visit (from the past 24 hour(s)).  Studies/Results: DG ESOPHAGUS W SINGLE CM (SOL OR THIN BA)  Addendum Date: 10/22/2019   ADDENDUM REPORT: 10/22/2019 11:40 ADDENDUM: These results were called by telephone at the time of interpretation on 10/22/2019 at 11:40 am to provider Dr. Penelope Coop, who verbally acknowledged these results. Electronically Signed   By: Zetta Bills M.D.   On: 10/22/2019 11:40   Result Date: 10/22/2019 CLINICAL DATA:  Recent esophageal dilation, history of esophageal repair after leak and multiple dilations. EXAM: ESOPHOGRAM/BARIUM SWALLOW TECHNIQUE: Single contrast examination was performed using water-soluble and thin barium contrast material. FLUOROSCOPY TIME:  Fluoroscopy Time:  3 minutes 30 seconds Radiation Exposure Index (if provided by the fluoroscopic device): 25.5 mGy Number of Acquired Spot Images: 6 COMPARISON:  Multiple priors most recent, sonogram from 10/18/2019 FINDINGS: Scout image was  obtained. Initial swallow all of water-soluble contrast was performed again showing narrowing of the distal esophagus but without the degree of dilation seen proximal to this area on previous studies. There is mucosal irregularity at the level of narrowing with shelf-like narrowing in the proximal portion. No leak was demonstrated with water-soluble contrast in bilateral oblique projections and left lateral projection or in the AP projection. Swallowing of thin barium contrast was then performed in left and right oblique projections and near left lateral projection. Subtle added density with the barium contrast was seen along the left posterolateral margin. Is unclear whether this represented artifact and no clear sign of leak could be visualized on subsequent images. IMPRESSION: 1. Subtle area of added density seen along the left posterolateral margin of the mid esophagus without clear signs of leak. Given patient history, CT of the chest may be helpful to exclude a tiny occult leak. 2. Persistent narrowing of the esophagus, long segment beginning in the junction of middle and distal third as described 3. A call is out to the referring provider to further discuss findings in the above case. Electronically Signed: By: Zetta Bills M.D. On: 10/22/2019 09:15      Assessment: Esophageal stricture  Rule out subtle esophageal leak  Plan:   I have ordered a CT chest  We will discuss possible esophageal stent going forward.    SAM F Lilienne Weins 10/22/2019, 11:50 AM  Pager: [phone] If no answer or after 5 PM call [phone]

## 2019-10-23 ENCOUNTER — Inpatient Hospital Stay (HOSPITAL_COMMUNITY): Payer: Self-pay

## 2019-10-23 HISTORY — PX: IR REPLC GASTRO/COLONIC TUBE PERCUT W/FLUORO: IMG2333

## 2019-10-23 MED ORDER — BOOST / RESOURCE BREEZE PO LIQD CUSTOM
1.0000 | Freq: Three times a day (TID) | ORAL | Status: DC
  Administered 2019-10-23 – 2019-10-24 (×2): 1 via ORAL

## 2019-10-23 MED ORDER — IOHEXOL 300 MG/ML  SOLN: 50.0000 mL | Freq: Once | INTRAMUSCULAR | Status: DC | PRN

## 2019-10-23 MED ORDER — PRO-STAT SUGAR FREE PO LIQD
30.0000 mL | Freq: Two times a day (BID) | ORAL | Status: DC
  Administered 2019-10-23 – 2019-11-03 (×16): 30 mL
  Filled 2019-10-23 (×17): qty 30

## 2019-10-23 MED ORDER — LIDOCAINE VISCOUS HCL 2 % MT SOLN
OROMUCOSAL | Status: DC | PRN
  Administered 2019-10-23: 15 mL via OROMUCOSAL

## 2019-10-23 MED ORDER — LIDOCAINE VISCOUS HCL 2 % MT SOLN
OROMUCOSAL | Status: AC
  Filled 2019-10-23: qty 15

## 2019-10-23 NOTE — Progress Notes (Signed)
Nutrition Follow-up  RD working remotely.  DOCUMENTATION CODES:   Non-severe (moderate) malnutrition in context of social or environmental circumstances  INTERVENTION:   Continue bolus tube feeding regimen via G-tube: - 1 carton (237 ml) Osmolite 1.5 cal formula QID - Add Pro-stat 30 ml BID per tube  Tube feeding regimen provides 1622 kcal, 94 grams of protein, and 722 ml of H2O (74% minimum kcal needs, 85% minimum protein needs).  - Encourage adequate PO intake  - Boost Breeze po TID, each supplement provides 250 kcal and 9 grams of protein  NUTRITION DIAGNOSIS:   Moderate Malnutrition related to social / environmental circumstances (homeless, ETOH abuse) as evidenced by moderate muscle depletion, moderate fat depletion, mild fat depletion.  Ongoing, being addressed via TF and supplements  GOAL:   Patient will meet greater than or equal to 90% of their needs  Progressing  MONITOR:   Weight trends, Labs, I & O's, TF tolerance, Skin  REASON FOR ASSESSMENT:   Malnutrition Screening Tool    ASSESSMENT:   Patient with PMH significant for ETOH abuse, non-hodgkin's lymphoma, tonsillar cancer, esophogeal stricture s/p PEG, and HTN. Presents this admission with esophageal perforation.  11/24 -rightthoracotomy, repair of perforation, extubated 11/25 - G-tube converted to G-J tube 11/29 - pt removed NG tube, G-J tube clogged 12/01 - G-J tube unclogged, swallow study showing residual distal esophageal stricture and no leak of contrast 12/02 - diet advanced to clear liquids 12/04 - NPO, G-J tube fell out, transferred to ICU for sepsis 12/05 - transferred back to progressive care, G-J tube replaced 12/08 - PICC line 12/10 - swallow study showing no esophageal leak and severe distal esophageal stricture 12/11 - diet advanced to sips of clear liquids 12/16 - s/p EGD with dilation 12/21 - s/p EGD with dilation 12/22 - soft diet  12/25 - Regular diet 12/31 - s/p EGD  with dilation, clear liquids 01/01 - full liquids 01/05 - s/p EGD with dilation, dysphagia 1 diet 01/08 - s/p EGD with dilation 01/10 - clear liquid diet 01/11 - barium swallow showing subtle area of added density along the left posterolateral margin of the midesophagus without a clear sign of leak 01/12 - s/p G-J to G-tube exchange  Pt remains on clear liquid diet at this time. Esophageal stenting is being considered. RD will switch Ensure Enlive to [employer].  Noted pt refused a few bolus feedings due to concerns that tube feeding caused diarrhea.  Weight down slightly since last RD visit. Will continue to monitor trends.  RD will add Pro-stat to TF regimen given pt back on clear liquid diet which is unlikely to provide much protein.  Meal Completion: 100% x 2 meals (clear liquids)  Medications reviewed and include: Ensure Enlive BID, ferrous sulfate, folic acid, protonix, sucralfate  Labs reviewed: hemoglobin 8.5  UOP: 1200 ml x 24 hours  Diet Order:   Diet Order            Diet clear liquid Room service appropriate? Yes; Fluid consistency: Thin  Diet effective now              EDUCATION NEEDS:   Not appropriate for education at this time  Skin:  Skin Assessment: Skin Integrity Issues: Incisions: chest, back  Last BM:  10/22/19  Height:   Ht Readings from Last 1 Encounters:  09/04/19 5\' 8"  (1.727 m)    Weight:   Wt Readings from Last 1 Encounters:  10/23/19 70.2 kg    Ideal Body Weight:  70 kg  BMI:  Body mass index is 23.53 kg/m.  Estimated Nutritional Needs:   Kcal:  2200-2400 kcal  Protein:  110-125 grams  Fluid:  >/= 2.2 L/day    Gaynell Face, MS, RD, LDN Inpatient Clinical Dietitian Pager: [phone] Weekend/After Hours: [phone]

## 2019-10-23 NOTE — Progress Notes (Addendum)
Patient refuses to take eliquis. Patient claims that  Dr. Jarvis Morgan and Dr. Tami Lin told him that he should not take due to a possible dilation stent.

## 2019-10-23 NOTE — Procedures (Signed)
Pre procedural Dx: Dysphagia, request to convert GJ to G tube. Post procedural Dx: Same  Successful fluoroscopic guided conversion of existing GJ tube to a G tube. The feeding tube is ready for immediate use.  EBL: None Complications: None immediate.  Ronny Bacon, MD Pager #: [phone]

## 2019-10-23 NOTE — Progress Notes (Addendum)
4 Days Post-Op Procedure(s) (LRB): ESOPHAGOGASTRODUODENOSCOPY (EGD) WITH PROPOFOL (N/A) BALLOON DILATION (N/A) Subjective: Tolerating clear liquids.  Primary complaint is continuous stool leakage and perirectal discomfort.  Left for IR for G-J  -> G-tube change immediately after exam.   Objective: Vital signs in last 24 hours: Temp:  [97.4 F (36.3 C)-98.4 F (36.9 C)] 97.4 F (36.3 C) (01/12 0752) Pulse Rate:  [73-92] 73 (01/12 0341) Cardiac Rhythm: Normal sinus rhythm (01/12 0752) Resp:  [12-19] 12 (01/12 0341) BP: (93-128)/(72-91) 101/72 (01/12 0752) SpO2:  [96 %-98 %] 96 % (01/12 0752) Weight:  [70.2 kg] 70.2 kg (01/12 0615)     Intake/Output from previous day: 01/11 0701 - 01/12 0700 In: 1070 [P.O.:1050; I.V.:20] Out: 1200 [Urine:1200] Intake/Output this shift: No intake/output data recorded.  Physical Exam: General appearance: alert, cooperative and moderate distress Neurologic: intact Heart: NSR Abdomen: soft and non-tender. Wound: The chest incision and CT sites are well healed.   Lab Results: No results for input(s): WBC, HGB, HCT, PLT in the last 72 hours. BMET: No results for input(s): NA, K, CL, CO2, GLUCOSE, BUN, CREATININE, CALCIUM in the last 72 hours.  PT/INR: No results for input(s): LABPROT, INR in the last 72 hours. ABG    Component Value Date/Time   PHART 7.432 09/14/2019 1405   HCO3 24.1 09/14/2019 1405   TCO2 28 09/04/2019 1241   ACIDBASEDEF 6.0 (H) 09/04/2019 0522   O2SAT 95.4 09/14/2019 1405   CBG (last 3)  No results for input(s): GLUCAP in the last 72 hours.    10/22/19  CT CHEST WITHOUT CONTRAST  TECHNIQUE: Multidetector CT imaging of the chest was performed following the standard protocol without IV contrast.  COMPARISON:  Barium swallow exam of same day.  FINDINGS: Cardiovascular: Atherosclerosis of thoracic aorta is noted without aneurysm or dissection. Normal cardiac size. No pericardial effusion. Coronary artery  calcifications are noted.  Mediastinum/Nodes: Thyroid gland is unremarkable. No significant adenopathy is noted. There is significant wall thickening involving the distal esophagus suggesting inflammation and corresponds to stricture seen on prior barium swallow exam.  Lungs/Pleura: No pneumothorax or pleural effusion is noted. Left lung is clear. Probable scarring is noted posteriorly in the right lower lobe. There appears to be pleural thickening in this area suggesting prior chest tube tract.  Upper Abdomen: No acute abnormality.  Musculoskeletal: Old left rib fractures are noted. Acute to subacute fractures are seen involving the posterior aspects of the right seventh and eighth ribs.  IMPRESSION: No definite evidence of esophageal leak is noted.  There is significant wall thickening involving the distal esophagus suggesting inflammation and corresponds to stricture seen on prior barium swallow examination.  Probable scarring is noted posteriorly in the right lower lobe.  Old left rib fractures are noted.  Acute to subacute fractures are seen involving the posterior portions of the right seventh and eighth ribs.  Coronary artery calcifications are noted.  Aortic Atherosclerosis (ICD10-I70.0).   Electronically Signed   By: Marijo Conception M.D.   On: 10/22/2019 14:19 Assessment/Plan:   S/P Procedure(s) (LRB): ESOPHAGOGASTRODUODENOSCOPY (EGD) WITH PROPOFOL (N/A) BALLOON DILATION (N/A)  -POD-49right thoracotomy, repair of esophogeal perforation with intercostal muscle flap. -POD-27EGD with dilation of distal esophageal stricture to 89mm . -POD-13 repeat EGD with dilation#2of distal esophageal stricture to 30mm. -POD-9EGD with dilation #3 to 4mm by Dr. Penelope Coop -POD-7EGD with dilation #4 to 13.7mm by Dr. Therisa Doyne. -EGD-4 EGD with dilation #5 to 66mm today by Dr. Therisa Doyne.  Appreciate assistance from GI service.  Esophageal  stenting is being considered.   Findings of barium swallow and CT chest noted. No clear evidence of leak, expected inflammatory changes in the distal esophagus are noted.  We have transitioned to bolus feeding QID and he seems to be tolerating this well.   IR to change out dual lumen G-J tube for a G-tube today.   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 general surgeryagain concluded the mass is too large for them to safely manage and recommend referral to a tertiary center.[facility] and now awaiting instructions foroutpatient follow up.Will attempt to contact again today. This continues to cause a lot of discomfort and I suspect is the source for his on-going anemia.   -CM teamhas beenworking on disposition. Currentlythe only option is discharge to a hotel room.by way of a charity program that will cover the costs temporarily. Home healthhas beenarranged to supply tube feeding.   -H/OEtOH abuse-Calm and cooperative.  -Mild anemia- Hctremainsstable.Monitor.Recheck lab tomorrow.    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, MD [facility] [phone]

## 2019-10-24 LAB — CBC
HCT: 27.7 % — ABNORMAL LOW (ref 39.0–52.0)
Hemoglobin: 8.3 g/dL — ABNORMAL LOW (ref 13.0–17.0)
MCH: 24.3 pg — ABNORMAL LOW (ref 26.0–34.0)
MCHC: 30 g/dL (ref 30.0–36.0)
MCV: 81 fL (ref 80.0–100.0)
Platelets: 330 10*3/uL (ref 150–400)
RBC: 3.42 MIL/uL — ABNORMAL LOW (ref 4.22–5.81)
RDW: 15.4 % (ref 11.5–15.5)
WBC: 6.7 10*3/uL (ref 4.0–10.5)
nRBC: 0 % (ref 0.0–0.2)

## 2019-10-24 LAB — BASIC METABOLIC PANEL
Anion gap: 6 (ref 5–15)
BUN: 8 mg/dL (ref 8–23)
CO2: 26 mmol/L (ref 22–32)
Calcium: 9.2 mg/dL (ref 8.9–10.3)
Chloride: 103 mmol/L (ref 98–111)
Creatinine, Ser: 0.49 mg/dL — ABNORMAL LOW (ref 0.61–1.24)
GFR calc Af Amer: >60 mL/min (ref >60)
GFR calc non Af Amer: >60 mL/min (ref >60)
Glucose, Bld: 97 mg/dL (ref 70–99)
Potassium: 4.1 mmol/L (ref 3.5–5.1)
Sodium: 135 mmol/L (ref 135–145)

## 2019-10-24 MED ORDER — ENSURE ENLIVE PO LIQD
237.0000 mL | Freq: Three times a day (TID) | ORAL | Status: DC
  Administered 2019-10-24 – 2019-11-03 (×18): 237 mL via ORAL

## 2019-10-24 NOTE — Progress Notes (Addendum)
Managed to give osmolite bolus via GT with good technique, very perceptive to education.

## 2019-10-24 NOTE — Progress Notes (Signed)
Refused to take eliquis , GI Md and TCT PA  Made aware.

## 2019-10-24 NOTE — Progress Notes (Signed)
No major complaints today.  He thinks he is doing a little better from a swallowing standpoint.  I talked to him about placing an esophageal stent, but right now he wants to try to advance his diet some and see how he does before making that decision.  I will advance him to full liquids today.

## 2019-10-24 NOTE — Progress Notes (Addendum)
5 Days Post-Op Procedure(s) (LRB): ESOPHAGOGASTRODUODENOSCOPY (EGD) WITH PROPOFOL (N/A) BALLOON DILATION (N/A) Subjective: Says he is swallowing clear liquids with no problem since last dilation.  Continues to have a lot of discomfort related to perianal mass.   Objective: Vital signs in last 24 hours: Temp:  [96.3 F (35.7 C)-98 F (36.7 C)] 98 F (36.7 C) (01/13 0432) Pulse Rate:  [74-101] 101 (01/12 1622) Cardiac Rhythm: Normal sinus rhythm (01/13 0700) Resp:  [12-20] 20 (01/13 0432) BP: (89-113)/(61-98) 94/61 (01/13 0432) SpO2:  [93 %-96 %] 96 % (01/13 0525) Weight:  [70.8 kg] 70.8 kg (01/13 0432)   Intake/Output from previous day: 01/12 0701 - 01/13 0700 In: 927 [P.O.:630; I.V.:60] Out: -  Intake/Output this shift: No intake/output data recorded.  Physical Exam: General appearance:alert, cooperative and moderate distress Neurologic:intact Heart:NSR Abdomen:soft and non-tender. G-J tube replaced with G-tube by IR yesterday and is functioning well.    Lab Results: Recent Labs    10/24/19 0247  WBC 6.7  HGB 8.3*  HCT 27.7*  PLT 330   BMET:  Recent Labs    10/24/19 0247  NA 135  K 4.1  CL 103  CO2 26  GLUCOSE 97  BUN 8  CREATININE 0.49*  CALCIUM 9.2    PT/INR: No results for input(s): LABPROT, INR in the last 72 hours. ABG    Component Value Date/Time   PHART 7.432 09/14/2019 1405   HCO3 24.1 09/14/2019 1405   TCO2 28 09/04/2019 1241   ACIDBASEDEF 6.0 (H) 09/04/2019 0522   O2SAT 95.4 09/14/2019 1405   CBG (last 3)  No results for input(s): GLUCAP in the last 72 hours.  Assessment/Plan: S/P Procedure(s) (LRB): ESOPHAGOGASTRODUODENOSCOPY (EGD) WITH PROPOFOL (N/A) BALLOON DILATION (N/A)  -POD-50right thoracotomy, repair of esophogeal perforation with intercostal muscle flap. -POD-28EGD with dilation of distal esophageal stricture to 40mm . -POD-14 repeat EGD with dilation#2of distal esophageal stricture to 47mm. -POD-10EGD with  dilation #3 to 34mm by Dr. Penelope Wade -POD-8EGD with dilation#4to 13.36mm by Dr. Therisa Wade. -EGD-5 EGDwith dilation #5to 35mm today by Dr. Therisa Wade.  Appreciate assistance from GI service. Esophageal stenting is being considered.  Findings of barium swallow and CT chest noted. No clear evidence of leak, expected inflammatory changes in the distal esophagus are noted.  We have transitioned to bolus feeding QID and he seems to be tolerating this well.   IR changed out dual lumen G-J tube for a G-tube yesterday and it is functioning appropriately.   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 General surgeryagain concluded the mass is too large for them to safely manage and recommend referral to a tertiary center. [facility] to [facility]. They requested an application for financial assistance before making an appointment. George Wade has signed the form and I will fax it back to them today.    -CM teamhas beenworking on disposition. Currentlythe only option is discharge to a hotel room.by way of a charity program that will cover the costs temporarily. Home healthhas beenarranged to supply tube feeding.   -H/OEtOH abuse  -Mild anemia- Hctremainsstable.Monitor. Continue iron supplement.   LOS: 51 days    George Wade, [place] [phone] 10/24/2019 Patient seen and examined, agree with above Tolerating clears. After talking to Dr. Penelope Wade, he is going to see how he does with full liquids today  George Lipps C. [name], MD [facility] [phone]

## 2019-10-25 NOTE — Plan of Care (Signed)
  Problem: Education: Goal: Knowledge of General Education information will improve Description: Including pain rating scale, medication(s)/side effects and non-pharmacologic comfort measures Outcome: Progressing   Problem: Health Behavior/Discharge Planning: Goal: Ability to manage health-related needs will improve Outcome: Progressing   Problem: Clinical Measurements: Goal: Ability to maintain clinical measurements within normal limits will improve Outcome: Progressing Goal: Will remain free from infection Outcome: Progressing Goal: Diagnostic test results will improve Outcome: Progressing Goal: Respiratory complications will improve Outcome: Progressing Goal: Cardiovascular complication will be avoided Outcome: Progressing   Problem: Activity: Goal: Risk for activity intolerance will decrease Outcome: Progressing   Problem: Nutrition: Goal: Adequate nutrition will be maintained Outcome: Progressing   Problem: Coping: Goal: Level of anxiety will decrease Outcome: Progressing   Problem: Elimination: Goal: Will not experience complications related to bowel motility Outcome: Progressing Goal: Will not experience complications related to urinary retention Outcome: Progressing   Problem: Pain Managment: Goal: General experience of comfort will improve Outcome: Progressing   Problem: Safety: Goal: Ability to remain free from injury will improve Outcome: Progressing   Problem: Skin Integrity: Goal: Risk for impaired skin integrity will decrease Outcome: Progressing   Problem: Bowel/Gastric: Goal: Gastrointestinal status for postoperative course will improve Outcome: Progressing   Problem: Nutritional: Goal: Ability to achieve adequate nutritional intake will improve Outcome: Progressing   Problem: Clinical Measurements: Goal: Postoperative complications will be avoided or minimized Outcome: Progressing   Problem: Respiratory: Goal: Ability to maintain a  clear airway will improve Outcome: Progressing   Problem: Skin Integrity: Goal: Demonstration of wound healing without infection will improve Outcome: Progressing

## 2019-10-25 NOTE — Progress Notes (Signed)
Pt ate 4 bites of his green beans, and then moved over to his meat and only was able to eat 1 bite of his meat and felt it was caught in his throat.  He then tried 1 bite of his pears and had the same trouble.   Pt had 2 of his Tramadol's today and had no trouble swallowing them today or other mediations - reported.  Pt was then given his mediation at 1720 after his attempt of above dinner meal, and said he w as not sure if he would be able to take the medications, due to the his throat spasms for a few minutes up to hours.   Will continue to monitor the Pt.

## 2019-10-25 NOTE — Progress Notes (Signed)
When I saw the patient this morning he told me he would like to try to advance his diet to something with more substance, and if he did not tolerate that then consider the esophageal stent.  I told him I would go ahead and advance him to a soft diet and see how he did with that.

## 2019-10-25 NOTE — Progress Notes (Addendum)
6 Days Post-Op Procedure(s) (LRB): ESOPHAGOGASTRODUODENOSCOPY (EGD) WITH PROPOFOL (N/A) BALLOON DILATION (N/A) Subjective: Did not tolerate full liquids yesterday. Says he wants to follow through with esophageal stent placement as offered by the GI service.  Objective: Vital signs in last 24 hours: Temp:  [97.5 F (36.4 C)-98.5 F (36.9 C)] 98.3 F (36.8 C) (01/14 0736) Pulse Rate:  [77-92] 86 (01/14 0736) Cardiac Rhythm: Normal sinus rhythm (01/14 0700) Resp:  [15-18] 15 (01/14 0736) BP: (109-130)/(81-92) 112/81 (01/14 0736) SpO2:  [93 %-99 %] 96 % (01/14 0736) Weight:  [70.4 kg] 70.4 kg (01/14 0355)    Intake/Output from previous day: 01/13 0701 - 01/14 0700 In: 1251 [P.O.:717; NG/GT:474] Out: 1 [Stool:1] Intake/Output this shift: No intake/output data recorded.  Physical Exam: General appearance:alert, cooperative and moderatedistress Neurologic:intact Heart:NSR Abdomen:soft and non-tender.   Lab Results: Recent Labs    10/24/19 0247  WBC 6.7  HGB 8.3*  HCT 27.7*  PLT 330   BMET:  Recent Labs    10/24/19 0247  NA 135  K 4.1  CL 103  CO2 26  GLUCOSE 97  BUN 8  CREATININE 0.49*  CALCIUM 9.2    PT/INR: No results for input(s): LABPROT, INR in the last 72 hours. ABG    Component Value Date/Time   PHART 7.432 09/14/2019 1405   HCO3 24.1 09/14/2019 1405   TCO2 28 09/04/2019 1241   ACIDBASEDEF 6.0 (H) 09/04/2019 0522   O2SAT 95.4 09/14/2019 1405   CBG (last 3)  No results for input(s): GLUCAP in the last 72 hours.  Assessment/Plan: S/P Procedure(s) (LRB): ESOPHAGOGASTRODUODENOSCOPY (EGD) WITH PROPOFOL (N/A) BALLOON DILATION (N/A)  -POD-51right thoracotomy, repair of esophogeal perforation with intercostal muscle flap. -POD-29EGD with dilation of distal esophageal stricture to 74mm . -POD-15repeat EGD with dilation#2of distal esophageal stricture to 79mm. -POD-11EGD with dilation #3 to 74mm by Dr. Penelope Coop -POD-9EGD with dilation#4to  13.8mm by Dr. Therisa Doyne. -EGD-6EGDwith dilation #5to 72mm today by Dr. Therisa Doyne.  Appreciate assistance from GI service.Esophageal stenting has been offered and Mr. Majano now says he would like to follow through with this.   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 General surgeryagain concluded the mass is too large for them to safely manage and recommend referral to a tertiary center. I havecontact the colorectal service at [facility] to arrange an outpatient visit. They requested an application for financial assistance before making an appointment. Mr. Raffield has signed the form and it was faxed  back to them yesterday.   I was able to secure an outpatient appointment with the colorectal surgery service at [facility] yesterday with Dr. Shela Leff on Friday 11/09/19 at 09:30am.   -CM teamhas beenworking on disposition. Currentlythe only option is discharge to a hotel room.by way of a charity program that will cover the costs temporarily. Home healthhas beenarranged to supply tube feeding.   -H/OEtOH abuse  -Mild anemia- Hctremainsstable.Monitor. Continue iron supplement.   LOS: 3 days    [name], [place] [phone] 10/25/2019 Patient seen and examined, agree with above C/o diarrhea with Osmolite- will ask dietary if there is a different TF that would cause less, if not will add imodium.  Revonda Standard [name], MD [facility] [phone]

## 2019-10-26 ENCOUNTER — Other Ambulatory Visit: Payer: Self-pay | Admitting: Gastroenterology

## 2019-10-26 MED ORDER — APIXABAN 5 MG PO TABS
5.0000 mg | ORAL_TABLET | Freq: Two times a day (BID) | ORAL | Status: AC
  Administered 2019-10-26 – 2019-10-27 (×3): 5 mg via ORAL
  Filled 2019-10-26 (×4): qty 1

## 2019-10-26 NOTE — Progress Notes (Addendum)
The patient had ongoing dysphagia with advancement of diet and he did not tolerate it very well.  He would like to proceed with esophageal stent placement.  I spoke with Dr. Watt Climes who does these for our group.  He thinks that he can do it next Tuesday.  His medical assistant will call to make arrangements.  This could be done as an outpatient if he goes home.  I explained the procedure to the patient along with the potential risks of bleeding, action, perforation, sedation problems, chest pain.  He understands.  He would like to proceed with the esophageal stent placement because of ongoing dysphagia.  I spoke with Dr Watt Climes and he will plan on doing the stent on Tuesday 1-19, at 1:00pm. The patient will need to stop Eliquis for a couple of days so last dose Saturday evening, and be NPO after midnight on Monday. If he goes home he will need to be given these instructions.

## 2019-10-26 NOTE — Progress Notes (Addendum)
7 Days Post-Op Procedure(s) (LRB): ESOPHAGOGASTRODUODENOSCOPY (EGD) WITH PROPOFOL (N/A) BALLOON DILATION (N/A) Subjective: George Wade wanted to try to advance his diet yesterday to soft which he has not tolerated.  He is still able to swallow liquids.   Objective: Vital signs in last 24 hours: Temp:  [97.2 F (36.2 C)-98.3 F (36.8 C)] 97.2 F (36.2 C) (01/15 0009) Pulse Rate:  [74-86] 86 (01/14 1951) Cardiac Rhythm: Normal sinus rhythm (01/15 0033) Resp:  [13-14] 14 (01/14 1951) BP: (101-142)/(76-97) 142/97 (01/15 0009) SpO2:  [98 %-99 %] 99 % (01/14 1951) Weight:  [70.9 kg] 70.9 kg (01/15 0129)     Intake/Output from previous day: 01/14 0701 - 01/15 0700 In: 280 [P.O.:240; I.V.:40] Out: -  Intake/Output this shift: No intake/output data recorded.  Physical Exam: General appearance:alert, cooperative and moderatedistress Neurologic:intact Heart:NSR Abdomen:soft and non-tender.  Lab Results: Recent Labs    10/24/19 0247  WBC 6.7  HGB 8.3*  HCT 27.7*  PLT 330   BMET:  Recent Labs    10/24/19 0247  NA 135  K 4.1  CL 103  CO2 26  GLUCOSE 97  BUN 8  CREATININE 0.49*  CALCIUM 9.2    PT/INR: No results for input(s): LABPROT, INR in the last 72 hours. ABG    Component Value Date/Time   PHART 7.432 09/14/2019 1405   HCO3 24.1 09/14/2019 1405   TCO2 28 09/04/2019 1241   ACIDBASEDEF 6.0 (H) 09/04/2019 0522   O2SAT 95.4 09/14/2019 1405   CBG (last 3)  No results for input(s): GLUCAP in the last 72 hours.  Assessment/Plan: S/P Procedure(s) (LRB): ESOPHAGOGASTRODUODENOSCOPY (EGD) WITH PROPOFOL (N/A) BALLOON DILATION (N/A)  -POD-52right thoracotomy, repair of esophogeal perforation with intercostal muscle flap. -POD-30EGD with dilation of distal esophageal stricture to 56mm . -POD-16repeat EGD with dilation#2of distal esophageal stricture to 67mm. -POD-12EGD with dilation #3 to 9mm by Dr. Penelope Coop -POD-10EGD with dilation#4to 13.52mm by Dr.  Therisa Doyne. -EGD-7EGDwith dilation #5to 61mm today by Dr. Therisa Doyne.  Appreciate assistance from GI service.Esophageal stenting has been offered and he initially said he wanted to proceed yesterday but later decided to try advancing his diet one more time. He has been unable to swallow anything but liquids over the past 24 hours and George Wade now says he would like to follow through with esophageal stenting.   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 General surgeryagain concluded the mass is too large for them to safely manage and recommend referral to a tertiary center. I havecontact the colorectal service at [facility] arrange an outpatient visit. They requested an application for financial assistance before making an appointment. George Wade has signed the form and it was faxed  back to them on 1/13. No response from them yet.   I was able to secure an outpatient appointment with the colorectal surgery service at [facility] yesterday with Dr. Shela Leff on Friday 11/09/19 at 09:30am.   -CM teamhas beenworking on disposition. Currentlythe only option is discharge to a hotel room.by way of a charity program that will cover the costs temporarily. Home healthhas beenarranged to supply tube feeding.   -H/OEtOH abuse  -Mild anemia- Hctremainsstable.Monitor.Continue iron supplement.   LOS: 28 days    George Wade [phone] 10/26/2019 Tolerating clears but unable to advance, GI considering stent If stent placed and can advance diet it would simplify matters, if not will have to discharge on clears and tube feedings  [name], MD [facility] [phone]

## 2019-10-26 NOTE — Plan of Care (Signed)
  Problem: Education: Goal: Knowledge of General Education information will improve Description: Including pain rating scale, medication(s)/side effects and non-pharmacologic comfort measures Outcome: Progressing   Problem: Health Behavior/Discharge Planning: Goal: Ability to manage health-related needs will improve Outcome: Progressing   Problem: Clinical Measurements: Goal: Ability to maintain clinical measurements within normal limits will improve Outcome: Progressing Goal: Will remain free from infection Outcome: Progressing Goal: Diagnostic test results will improve Outcome: Progressing Goal: Respiratory complications will improve Outcome: Progressing Goal: Cardiovascular complication will be avoided Outcome: Progressing   Problem: Activity: Goal: Risk for activity intolerance will decrease Outcome: Progressing   Problem: Nutrition: Goal: Adequate nutrition will be maintained Outcome: Progressing   Problem: Coping: Goal: Level of anxiety will decrease Outcome: Progressing   Problem: Elimination: Goal: Will not experience complications related to bowel motility Outcome: Progressing Goal: Will not experience complications related to urinary retention Outcome: Progressing   Problem: Pain Managment: Goal: General experience of comfort will improve Outcome: Progressing   Problem: Safety: Goal: Ability to remain free from injury will improve Outcome: Progressing   Problem: Skin Integrity: Goal: Risk for impaired skin integrity will decrease Outcome: Progressing   Problem: Bowel/Gastric: Goal: Gastrointestinal status for postoperative course will improve Outcome: Progressing   Problem: Nutritional: Goal: Ability to achieve adequate nutritional intake will improve Outcome: Progressing   Problem: Clinical Measurements: Goal: Postoperative complications will be avoided or minimized Outcome: Progressing   Problem: Respiratory: Goal: Ability to maintain a  clear airway will improve Outcome: Progressing   Problem: Skin Integrity: Goal: Demonstration of wound healing without infection will improve Outcome: Progressing

## 2019-10-26 NOTE — TOC Progression Note (Signed)
Transition of Care (TOC) - Progression Note    Patient Details  Name: George Wade. MRN: [id number] Date of Birth: 1956-07-06  Transition of Care [facility]) CM/SW Contact  [name], RN Phone Number: 10/26/2019, 2:29 PM  Clinical Narrative:    NCM received call from PA, stating patient is for stent next Tuesday, and he has informed PA that he has Medicaid. They plan to have him ready for dc next week after the stent is placed.   Expected Discharge Plan: [place] Barriers to Discharge: Inadequate or no insurance, Continued Medical Work up  Expected Discharge Plan and Services Expected Discharge Plan: [place] arrangements for the past 2 months: Hotel/Motel                                       Social Determinants of Health (SDOH) Interventions    Readmission Risk Interventions No flowsheet data found.

## 2019-10-27 NOTE — Plan of Care (Signed)
  Problem: Education: Goal: Knowledge of General Education information will improve Description: Including pain rating scale, medication(s)/side effects and non-pharmacologic comfort measures Outcome: Progressing   Problem: Health Behavior/Discharge Planning: Goal: Ability to manage health-related needs will improve Outcome: Progressing   Problem: Clinical Measurements: Goal: Ability to maintain clinical measurements within normal limits will improve Outcome: Progressing Goal: Will remain free from infection Outcome: Progressing Goal: Diagnostic test results will improve Outcome: Progressing Goal: Respiratory complications will improve Outcome: Progressing Goal: Cardiovascular complication will be avoided Outcome: Progressing   Problem: Activity: Goal: Risk for activity intolerance will decrease Outcome: Progressing   Problem: Nutrition: Goal: Adequate nutrition will be maintained Outcome: Progressing   Problem: Coping: Goal: Level of anxiety will decrease Outcome: Progressing   Problem: Elimination: Goal: Will not experience complications related to bowel motility Outcome: Progressing Goal: Will not experience complications related to urinary retention Outcome: Progressing   Problem: Pain Managment: Goal: General experience of comfort will improve Outcome: Progressing   Problem: Safety: Goal: Ability to remain free from injury will improve Outcome: Progressing   Problem: Skin Integrity: Goal: Risk for impaired skin integrity will decrease Outcome: Progressing   Problem: Bowel/Gastric: Goal: Gastrointestinal status for postoperative course will improve Outcome: Progressing   Problem: Nutritional: Goal: Ability to achieve adequate nutritional intake will improve Outcome: Progressing   Problem: Clinical Measurements: Goal: Postoperative complications will be avoided or minimized Outcome: Progressing   Problem: Respiratory: Goal: Ability to maintain a  clear airway will improve Outcome: Progressing   Problem: Skin Integrity: Goal: Demonstration of wound healing without infection will improve Outcome: Progressing

## 2019-10-27 NOTE — Progress Notes (Addendum)
      [address]       [place] [address]             [phone]      8 Days Post-Op Procedure(s) (LRB): ESOPHAGOGASTRODUODENOSCOPY (EGD) WITH PROPOFOL (N/A) BALLOON DILATION (N/A) Subjective: Biggest complaint is esophageal spasm.    Objective: Vital signs in last 24 hours: Temp:  [97.7 F (36.5 C)-98.1 F (36.7 C)] 97.7 F (36.5 C) (01/16 0846) Pulse Rate:  [72-78] 78 (01/16 0846) Cardiac Rhythm: Normal sinus rhythm (01/16 0742) Resp:  [15-17] 15 (01/16 0846) BP: (89-109)/(66-77) 92/77 (01/16 0846) SpO2:  [98 %-99 %] 98 % (01/16 0846) Weight:  [71.2 kg] 71.2 kg (01/16 0500)     Intake/Output from previous day: 01/15 0701 - 01/16 0700 In: 440 [P.O.:400; I.V.:40] Out: -  Intake/Output this shift: Total I/O In: 40 [I.V.:40] Out: -   General appearance: alert, cooperative and no distress Heart: regular rate and rhythm, S1, S2 normal, no murmur, click, rub or gallop Lungs: clear to auscultation bilaterally Abdomen: diminished bowel sounds Extremities: extremities normal, atraumatic, no cyanosis or edema Wound: clean and dry  Lab Results: No results for input(s): WBC, HGB, HCT, PLT in the last 72 hours. BMET: No results for input(s): NA, K, CL, CO2, GLUCOSE, BUN, CREATININE, CALCIUM in the last 72 hours.  PT/INR: No results for input(s): LABPROT, INR in the last 72 hours. ABG    Component Value Date/Time   PHART 7.432 09/14/2019 1405   HCO3 24.1 09/14/2019 1405   TCO2 28 09/04/2019 1241   ACIDBASEDEF 6.0 (H) 09/04/2019 0522   O2SAT 95.4 09/14/2019 1405   CBG (last 3)  No results for input(s): GLUCAP in the last 72 hours.  Assessment/Plan: S/P Procedure(s) (LRB): ESOPHAGOGASTRODUODENOSCOPY (EGD) WITH PROPOFOL (N/A) BALLOON DILATION (N/A)  -POD-52right thoracotomy, repair of esophogeal perforation with intercostal muscle flap. -POD-30EGD with dilation of distal esophageal stricture to 58mm . -POD-16repeat EGD with dilation#2of  distal esophageal stricture to 92mm. -POD-12EGD with dilation #3 to 65mm by Dr. Penelope Coop -POD-10EGD with dilation#4to 13.58mm by Dr. Therisa Doyne. -EGD-7EGDwith dilation #5to 71mm today by Dr. Therisa Doyne.   -esophageal spasm this morning after drinking. Was tolerating sips of liquids but when switched to full liquid did not tolerate. Dilation and stenting planned for Tuesday.   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 Colorectal service at [facility] arrange an outpatient visit. Appointment is on 1/29 at 9:30am  -CM team working on disposition. Current plan id to discharge to a hotel where a charity program will cover the costs temporarily. HH has been arranged.   -h/o alcohol abuse  Plan: Continue current medical plan.    LOS: 54 days    [name] 10/27/2019   Unable to take anything but liquids po For esophageal dilation and stent Tuesday  I have seen and examined George Wade. and agree with the above assessment  and plan.  Grace Isaac MD Beeper [phone] Office [phone] 10/27/2019 11:01 AM

## 2019-10-28 NOTE — Progress Notes (Signed)
      [address]       [place] [address]             [phone]      9 Days Post-Op Procedure(s) (LRB): ESOPHAGOGASTRODUODENOSCOPY (EGD) WITH PROPOFOL (N/A) BALLOON DILATION (N/A) Subjective:  Avoiding solid food  Objective: Vital signs in last 24 hours: Temp:  [97.5 F (36.4 C)-98.2 F (36.8 C)] 98 F (36.7 C) (01/17 0837) Pulse Rate:  [82-98] 98 (01/16 2331) Cardiac Rhythm: Normal sinus rhythm (01/17 0707) Resp:  [13-25] 15 (01/17 0839) BP: (93-126)/(67-93) 95/70 (01/17 0839) SpO2:  [96 %-100 %] 96 % (01/17 0839) Weight:  [71.2 kg] 71.2 kg (01/17 0500)     Intake/Output from previous day: 01/16 0701 - 01/17 0700 In: 285 [P.O.:245; I.V.:40] Out: -  Intake/Output this shift: No intake/output data recorded.  General appearance: alert, cooperative and no distress Heart: regular rate and rhythm, S1, S2 normal, no murmur, click, rub or gallop Lungs: clear to auscultation bilaterally Abdomen: diminished bowel sounds Extremities: extremities normal, atraumatic, no cyanosis or edema Wound: clean and dry  Lab Results: No results for input(s): WBC, HGB, HCT, PLT in the last 72 hours. BMET: No results for input(s): NA, K, CL, CO2, GLUCOSE, BUN, CREATININE, CALCIUM in the last 72 hours.  PT/INR: No results for input(s): LABPROT, INR in the last 72 hours. ABG    Component Value Date/Time   PHART 7.432 09/14/2019 1405   HCO3 24.1 09/14/2019 1405   TCO2 28 09/04/2019 1241   ACIDBASEDEF 6.0 (H) 09/04/2019 0522   O2SAT 95.4 09/14/2019 1405   CBG (last 3)  No results for input(s): GLUCAP in the last 72 hours.  Assessment/Plan: S/P Procedure(s) (LRB): ESOPHAGOGASTRODUODENOSCOPY (EGD) WITH PROPOFOL (N/A) BALLOON DILATION (N/A)  -POD-52right thoracotomy, repair of esophogeal perforation with intercostal muscle flap. -POD-30EGD with dilation of distal esophageal stricture to 59mm . -POD-16repeat EGD with dilation#2of distal esophageal stricture  to 15mm. -POD-12EGD with dilation #3 to 23mm by Dr. Penelope Coop -POD-10EGD with dilation#4to 13.53mm by Dr. Therisa Doyne. -EGD-7EGDwith dilation #5to 35mm today by Dr. Therisa Doyne.    Dilation and stenting planned for Tuesday.  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 Colorectal service at [facility] arrange an outpatient visit. Appointment is on 1/29 at 9:30am  -CM team working on disposition. Current plan id to discharge to a hotel where a charity program will cover the costs temporarily. HH has been arranged.   -h/o alcohol abuse  Plan: Continue current medical plan.    LOS: 55 days    Grace Isaac 10/28/2019

## 2019-10-29 NOTE — Plan of Care (Signed)
  Problem: Clinical Measurements: Goal: Will remain free from infection Outcome: Progressing Goal: Diagnostic test results will improve Outcome: Progressing Goal: Cardiovascular complication will be avoided Outcome: Progressing   Problem: Activity: Goal: Risk for activity intolerance will decrease Outcome: Progressing   Problem: Nutrition: Goal: Adequate nutrition will be maintained Outcome: Progressing   Problem: Coping: Goal: Level of anxiety will decrease Outcome: Progressing   Problem: Elimination: Goal: Will not experience complications related to bowel motility Outcome: Progressing Goal: Will not experience complications related to urinary retention Outcome: Progressing   Problem: Pain Managment: Goal: General experience of comfort will improve Outcome: Progressing   Problem: Safety: Goal: Ability to remain free from injury will improve Outcome: Progressing   Problem: Skin Integrity: Goal: Risk for impaired skin integrity will decrease Outcome: Progressing   Problem: Bowel/Gastric: Goal: Gastrointestinal status for postoperative course will improve Outcome: Progressing   Problem: Nutritional: Goal: Ability to achieve adequate nutritional intake will improve Outcome: Progressing   Problem: Clinical Measurements: Goal: Postoperative complications will be avoided or minimized Outcome: Progressing   Problem: Respiratory: Goal: Ability to maintain a clear airway will improve Outcome: Progressing   Problem: Skin Integrity: Goal: Demonstration of wound healing without infection will improve Outcome: Progressing

## 2019-10-29 NOTE — Progress Notes (Signed)
Visited with George Wade.  Offered ministry of presence and listening:  to his concerns and fear tomorrow's surgery won't work; his plans for the future which seem unsure at this point.  We celebrate him getting his truck back.  We talked about his family and how the best any of [place] can do is to seek healing in ourselves of our spirit, mental and physical condition.  We had a rich conversation.  De Burrs Chaplain Resident

## 2019-10-29 NOTE — Progress Notes (Signed)
10 Days Post-Op Procedure(s) (LRB): ESOPHAGOGASTRODUODENOSCOPY (EGD) WITH PROPOFOL (N/A) BALLOON DILATION (N/A) Subjective: No new concerns.  He has remained on clear liquids over the weekend with no change in tolerance  Objective: Vital signs in last 24 hours: Temp:  [97.4 F (36.3 C)-98.4 F (36.9 C)] 98.4 F (36.9 C) (01/18 0740) Pulse Rate:  [90-111] 90 (01/18 0400) Cardiac Rhythm: Normal sinus rhythm (01/17 1900) Resp:  [14-20] 15 (01/18 0740) BP: (95-134)/(62-85) 114/85 (01/18 0740) SpO2:  [96 %-100 %] 100 % (01/18 0400) Weight:  [71.4 kg] 71.4 kg (01/18 0300)  Hemodynamic parameters for last 24 hours:    Intake/Output from previous day: 01/17 0701 - 01/18 0700 In: 140 [P.O.:100; I.V.:40] Out: -  Intake/Output this shift: No intake/output data recorded.  Physical Exam: General appearance:alert, cooperative and moderatedistress Neurologic:intact Heart:NSR Abdomen:soft and non-tender.  Lab Results: No results for input(s): WBC, HGB, HCT, PLT in the last 72 hours. BMET: No results for input(s): NA, K, CL, CO2, GLUCOSE, BUN, CREATININE, CALCIUM in the last 72 hours.  PT/INR: No results for input(s): LABPROT, INR in the last 72 hours. ABG    Component Value Date/Time   PHART 7.432 09/14/2019 1405   HCO3 24.1 09/14/2019 1405   TCO2 28 09/04/2019 1241   ACIDBASEDEF 6.0 (H) 09/04/2019 0522   O2SAT 95.4 09/14/2019 1405   CBG (last 3)  No results for input(s): GLUCAP in the last 72 hours.  Assessment/Plan: S/P Procedure(s) (LRB): ESOPHAGOGASTRODUODENOSCOPY (EGD) WITH PROPOFOL (N/A) BALLOON DILATION (N/A)  -POD-55right thoracotomy, repair of esophogeal perforation with intercostal muscle flap. -POD-33EGD with dilation of distal esophageal stricture to 38mm . -POD-96repeat EGD with dilation#2of distal esophageal stricture to 67mm. -POD-15EGD with dilation #3 to 18mm by Dr. Penelope Coop -POD-13EGD with dilation#4to 13.7mm by Dr. Therisa Doyne. -EGD-10EGDwith  dilation #5to 32mm today by Dr. Therisa Doyne.   Has recurring stricture and is unable to swallow anything except clear liquids.  Dilation and stenting planned for tomorrowper GI service. 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  Arranged outpatient visit with the colorectal service at [facility]. Appointment is on 1/29 at 9:30am with Dr. Salome Arnt.  -CM team working on disposition. Pt says he was contacted by the staff at [facility] who had assisted him with applying for Medicaid and was told he had been approved.   -h/o alcohol abuse   LOS: 56 days    [name], [place] [phone] 10/29/2019

## 2019-10-29 NOTE — Progress Notes (Signed)
Nutrition Follow-up  RD working remotely.  DOCUMENTATION CODES:   Non-severe (moderate) malnutrition in context of social or environmental circumstances  INTERVENTION:   Continue bolus tube feeding regimen via G-tube: - 1 carton (237 ml) Osmolite 1.5 cal formula QID - Pro-stat 30 ml BID per tube  Tube feeding regimen provides1622kcal, 94grams of protein, and 758ml of H2O (74% minimum kcal needs, 85% minimum protein needs).  - Encourage adequate PO intake  - Continue Ensure Enlive po TID, each supplement provides 350 kcal and 20 grams of protein  NUTRITION DIAGNOSIS:   Moderate Malnutrition related to social / environmental circumstances (homeless, ETOH abuse) as evidenced by moderate muscle depletion, moderate fat depletion, mild fat depletion.  Ongoing, being addressed via TF and supplements  GOAL:   Patient will meet greater than or equal to 90% of their needs  Progressing  MONITOR:   Weight trends, Labs, I & O's, TF tolerance, Skin  REASON FOR ASSESSMENT:   Malnutrition Screening Tool    ASSESSMENT:   Patient with PMH significant for ETOH abuse, non-hodgkin's lymphoma, tonsillar cancer, esophogeal stricture s/p PEG, and HTN. Presents this admission with esophageal perforation.  11/24 -rightthoracotomy, repair of perforation, extubated 11/25 - G-tube converted to G-J tube 11/29 - pt removed NG tube, G-J tube clogged 12/01 - G-J tube unclogged, swallow study showing residual distal esophageal stricture and no leak of contrast 12/02 - diet advanced to clear liquids 12/04 - NPO, G-J tube fell out, transferred to ICU for sepsis 12/05 - transferred back to progressive care, G-J tube replaced 12/08 - PICC line 12/10 - swallow study showing no esophageal leak and severe distal esophageal stricture 12/11 - diet advanced to sips of clear liquids 12/16 - s/p EGD with dilation 12/21 - s/p EGD with dilation 12/22 - soft diet  12/25 - Regular diet 12/31 - s/p  EGD with dilation, clear liquids 01/01 - full liquids 01/05 - s/p EGD with dilation, dysphagia 1 diet 01/08 - s/p EGD with dilation 01/10 - clear liquid diet 01/11 - barium swallow showing subtle area of added density along the left posterolateral margin of the midesophagus without a clear sign of leak 01/12 - s/p G-J to G-tube exchange 01/13 - full liquid diet 01/14 - soft diet  Noted plan for esophageal stent tomorrow. Pt will be NPO at midnight for the procedure.  An outpatient visit with the colorectal service at [facility] has been arranged for 11/09/19.  Weight stable, trending upward slightly since last RD assessment.  Pt occasionally refusing bolus tube feeds but not regularly. Will continue with current regimen.  Meal Completion: 0-100% x last 8 recorded meals (ranging from clear liquids to soft diet)  Medications reviewed and include: Ensure Enlive TID, ferrous sulfate, folic acid, protonix, sucralfate  Labs reviewed: hemoglobin 8.3  Diet Order:   Diet Order            Diet NPO time specified  Diet effective midnight        DIET SOFT Room service appropriate? Yes; Fluid consistency: Thin  Diet effective now              EDUCATION NEEDS:   Not appropriate for education at this time  Skin:  Skin Assessment: Skin Integrity Issues: Incisions: chest, back  Last BM:  10/28/19  Height:   Ht Readings from Last 1 Encounters:  09/04/19 5\' 8"  (1.727 m)    Weight:   Wt Readings from Last 1 Encounters:  10/29/19 71.4 kg  Ideal Body Weight:  70 kg  BMI:  Body mass index is 23.93 kg/m.  Estimated Nutritional Needs:   Kcal:  2200-2400 kcal  Protein:  110-125 grams  Fluid:  >/= 2.2 L/day    Gaynell Face, MS, RD, LDN Inpatient Clinical Dietitian Pager: [phone] Weekend/After Hours: [phone]

## 2019-10-30 ENCOUNTER — Encounter (HOSPITAL_COMMUNITY): Payer: Self-pay | Admitting: Thoracic Surgery (Cardiothoracic Vascular Surgery)

## 2019-10-30 ENCOUNTER — Inpatient Hospital Stay (HOSPITAL_COMMUNITY): Payer: Self-pay | Admitting: Registered Nurse

## 2019-10-30 ENCOUNTER — Inpatient Hospital Stay (HOSPITAL_COMMUNITY): Payer: Self-pay

## 2019-10-30 ENCOUNTER — Encounter (HOSPITAL_COMMUNITY)
Disposition: A | Discharge status: Skilled Nursing Facility | Payer: Self-pay | Source: Other Acute Inpatient Hospital | Attending: Thoracic Surgery (Cardiothoracic Vascular Surgery)

## 2019-10-30 HISTORY — PX: ESOPHAGOGASTRODUODENOSCOPY (EGD) WITH PROPOFOL: SHX5813

## 2019-10-30 HISTORY — PX: ESOPHAGEAL STENT PLACEMENT: SHX5540

## 2019-10-30 SURGERY — ESOPHAGOGASTRODUODENOSCOPY (EGD) WITH PROPOFOL
Anesthesia: Monitor Anesthesia Care

## 2019-10-30 MED ORDER — ALUM & MAG HYDROXIDE-SIMETH 200-200-20 MG/5ML PO SUSP
15.0000 mL | Freq: Four times a day (QID) | ORAL | Status: DC | PRN
  Administered 2019-10-30 – 2019-11-02 (×3): 15 mL via ORAL
  Filled 2019-10-30 (×3): qty 30

## 2019-10-30 MED ORDER — PROPOFOL 500 MG/50ML IV EMUL
INTRAVENOUS | Status: DC | PRN
  Administered 2019-10-30: 100 ug/kg/min via INTRAVENOUS

## 2019-10-30 MED ORDER — LACTATED RINGERS IV SOLN: INTRAVENOUS | Status: DC

## 2019-10-30 MED ORDER — PROPOFOL 10 MG/ML IV BOLUS
INTRAVENOUS | Status: DC | PRN
  Administered 2019-10-30: 30 mg via INTRAVENOUS
  Administered 2019-10-30 (×2): 10 mg via INTRAVENOUS

## 2019-10-30 SURGICAL SUPPLY — 15 items

## 2019-10-30 NOTE — Progress Notes (Signed)
George Wade. 1:03 PM  Subjective: Patient seen and examined in hospital computer chart reviewed and case discussed with multiple of my partners particularly Dr. Penelope Coop last week and his Eliquis has been on hold  Objective: Vital signs stable afebrile no acute distress exam please see preassessment evaluation last labs reviewed last week CT and radiographic swallow reviewed Assessment: Difficult to dilate esophageal stricture  Plan: The risk and benefits of metal stent placement was discussed with the patient and will proceed today with further work-up and plans pending those findings  [facility]  office [phone] After 5PM or if no answer call [phone]

## 2019-10-30 NOTE — Op Note (Signed)
[facility] Patient Name: George Wade Procedure Date : 10/30/2019 MRN: [id number] Attending MD: Clarene Essex , MD Date of Birth: 02-01-1956 CSN: [id number] Age: 64 Admit Type: Inpatient Procedure:                Upper GI endoscopy Indications:              Dysphagia, difficult to dilate stricture status                            post Boerhaave's and surgery Providers:                Clarene Essex, MD, Benay Pillow, RN, Vista Lawman,                            RN, Elspeth Cho Tech., Technician, Claybon Jabs                            CRNA, CRNA Referring MD:              Medicines:                Propofol total dose 373 mg IV Complications:            No immediate complications. Estimated Blood Loss:     Estimated blood loss: none. Procedure:                Pre-Anesthesia Assessment:                           - Prior to the procedure, a History and Physical                            was performed, and patient medications and                            allergies were reviewed. The patient's tolerance of                            previous anesthesia was also reviewed. The risks                            and benefits of the procedure and the sedation                            options and risks were discussed with the patient.                            All questions were answered, and informed consent                            was obtained. Prior Anticoagulants: The patient has                            taken Eliquis (apixaban), last dose was 2 days  prior to procedure. ASA Grade Assessment: II - A                            patient with mild systemic disease. After reviewing                            the risks and benefits, the patient was deemed in                            satisfactory condition to undergo the procedure.                           After obtaining informed consent, the endoscope was                            passed  under direct vision. Throughout the                            procedure, the patient's blood pressure, pulse, and                            oxygen saturations were monitored continuously. The                            Endoscope was introduced through the mouth, and                            advanced to the lower third of esophagus. The upper                            GI endoscopy was accomplished without difficulty.                            The patient tolerated the procedure well. Scope In: Scope Out: Findings:      The larynx was normal.      One benign-appearing, intrinsic severe stenosis was found 33 cm from the       incisors. The stenosis was not able to be traversed despite using the       ultraslim scope. This was stented with a 23 mm x 10.5 cm WallFlex       covered stent under fluoroscopic guidance. We passed the Jagwire under       fluoroscopy guidance and the scope was removed keeping the wire in the       proper position in the stomach and then the stent was inserted under       fluoroscopy over the wire the scope was reinserted next to the stent and       the stent was released using both endoscopic and fluoroscopic guidance       and the wire and introducer was removed and there was a tight narrowing       in the middle of the stent and we could not easily advance the ultra       slim at this time the scope was removed and the procedure was terminated  and the patient tolerated the procedure well      The exam was otherwise without abnormality. Impression:               - Normal larynx.                           - Benign-appearing esophageal stenosis. Prosthesis                            placed.                           - The examination was otherwise normal.                           - No specimens collected. Recommendation:           - Clear liquid diet today. Soft solids tomorrow and                            will need to teach patient antireflux  measures as                            well as a post stent diet to include chewing food                            well drink plenty of liquids eating slowly                           - Resume Eliquis (apixaban) at prior dose tomorrow                            if doing well from a GI standpoint.                           - Return to GI clinic in 4 weeks. Stent removal in                            2 to 3 months depending on how he is doing                           - Telephone GI clinic if symptomatic PRN. Procedure Code(s):        --- Professional ---                           [id number], Esophagoscopy, flexible, transoral; with                            placement of endoscopic stent (includes pre- and                            post-dilation and guide wire passage, when                            performed) Diagnosis Code(s):        ---  Professional ---                           K22.2, Esophageal obstruction                           R13.10, Dysphagia, unspecified CPT copyright 2019 American Medical Association. All rights reserved. The codes documented in this report are preliminary and upon coder review may  be revised to meet current compliance requirements. Clarene Essex, MD 10/30/2019 1:47:54 PM This report has been signed electronically. Number of Addenda: 0

## 2019-10-30 NOTE — Transfer of Care (Signed)
Immediate Anesthesia Transfer of Care Note  Patient: George Wade.  Procedure(s) Performed: ESOPHAGOGASTRODUODENOSCOPY (EGD) WITH PROPOFOL (N/A )  Patient Location: Endoscopy Unit  Anesthesia Type:MAC  Level of Consciousness: drowsy and patient cooperative  Airway & Oxygen Therapy: Patient Spontanous Breathing and Patient connected to nasal cannula oxygen  Post-op Assessment: Report given to RN, Post -op Vital signs reviewed and stable and Patient moving all extremities  Post vital signs: Reviewed and stable  Last Vitals:  Vitals Value Taken Time  BP 97/62 10/30/19 1340  Temp 36.6 C 10/30/19 1340  Pulse 80 10/30/19 1340  Resp 10 10/30/19 1340  SpO2 99 % 10/30/19 1340    Last Pain:  Vitals:   10/30/19 1340  TempSrc: Oral  PainSc: 5       Patients Stated Pain Goal: 1 (56/21/30 8657)  Complications: No apparent anesthesia complications

## 2019-10-30 NOTE — Anesthesia Preprocedure Evaluation (Signed)
Anesthesia Evaluation  Patient identified by MRN, date of birth, ID band Patient awake    Reviewed: Allergy & Precautions, H&P , NPO status , Patient's Chart, lab work & pertinent test results  Airway Mallampati: II   Neck ROM: full    Dental   Pulmonary neg pulmonary ROS,    breath sounds clear to auscultation       Cardiovascular hypertension,  Rhythm:regular Rate:Normal     Neuro/Psych    GI/Hepatic (+)     substance abuse  alcohol use, dysphagia   Endo/Other    Renal/GU      Musculoskeletal   Abdominal   Peds  Hematology  (+) anemia ,   Anesthesia Other Findings   Reproductive/Obstetrics                             Anesthesia Physical Anesthesia Plan  ASA: III  Anesthesia Plan: MAC   Post-op Pain Management:    Induction: Intravenous  PONV Risk Score and Plan: 1 and Propofol infusion and Treatment may vary due to age or medical condition  Airway Management Planned: Nasal Cannula  Additional Equipment:   Intra-op Plan:   Post-operative Plan:   Informed Consent: I have reviewed the patients History and Physical, chart, labs and discussed the procedure including the risks, benefits and alternatives for the proposed anesthesia with the patient or authorized representative who has indicated his/her understanding and acceptance.       Plan Discussed with: CRNA, Anesthesiologist and Surgeon  Anesthesia Plan Comments:         Anesthesia Quick Evaluation

## 2019-10-30 NOTE — Progress Notes (Addendum)
11 Days Post-Op Procedure(s) (LRB): ESOPHAGOGASTRODUODENOSCOPY (EGD) WITH PROPOFOL (N/A) BALLOON DILATION (N/A) Subjective: No new problems or concerns.   Objective: Vital signs in last 24 hours: Temp:  [97.7 F (36.5 C)-98.1 F (36.7 C)] 98 F (36.7 C) (01/19 0807) Pulse Rate:  [84-103] 84 (01/19 0807) Cardiac Rhythm: Normal sinus rhythm (01/19 0734) Resp:  [12-25] 17 (01/19 0807) BP: (96-119)/(61-97) 96/61 (01/19 0807) SpO2:  [98 %-100 %] 98 % (01/19 0300)     Intake/Output from previous day: 01/18 0701 - 01/19 0700 In: 200 [P.O.:200] Out: -  Intake/Output this shift: No intake/output data recorded.  Physical Exam: General appearance:alert, cooperative and moderatedistress Neurologic:intact Heart:NSR  Lab Results: No results for input(s): WBC, HGB, HCT, PLT in the last 72 hours. BMET: No results for input(s): NA, K, CL, CO2, GLUCOSE, BUN, CREATININE, CALCIUM in the last 72 hours.  PT/INR: No results for input(s): LABPROT, INR in the last 72 hours. ABG    Component Value Date/Time   PHART 7.432 09/14/2019 1405   HCO3 24.1 09/14/2019 1405   TCO2 28 09/04/2019 1241   ACIDBASEDEF 6.0 (H) 09/04/2019 0522   O2SAT 95.4 09/14/2019 1405   CBG (last 3)  No results for input(s): GLUCAP in the last 72 hours.  Assessment/Plan: S/P Procedure(s) (LRB): ESOPHAGOGASTRODUODENOSCOPY (EGD) WITH PROPOFOL (N/A) BALLOON DILATION (N/A)  -POD-56right thoracotomy, repair of esophogeal perforation with intercostal muscle flap. -POD-34EGD with dilation of distal esophageal stricture to 70mm . -POD-20repeat EGD with dilation#2of distal esophageal stricture to 58mm. -POD-16EGD with dilation #3 to 54mm by Dr. Penelope Coop -POD-14EGD with dilation#4to 13.70mm by Dr. Therisa Doyne. -EGD-11EGDwith dilation #5to 2mm by Dr. Therisa Doyne.   Has recurring stricture and is unable to swallow anything except clear liquids.  Dilation and stenting planned for today per GI service. 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  Arranged outpatient visit with the colorectal service at [facility]. Appointment is on 1/29 at 9:30am with Dr. Salome Arnt.  -CM team working on disposition. Pt says he was contacted by the staff at [facility] who had assisted him with applying for Medicaid and was told he had been approved.   -h/o alcohol abuse   LOS: 43 days    George Wade [phone] 10/30/2019 Patient seen and examined, agree with above For stent today  [name], MD [facility] [phone]

## 2019-10-30 NOTE — Progress Notes (Signed)
Transferred -in  from  Endoscopy by wheelchair, awake and alert. Complained of heart burn , maalox 15 ml given . Continue to monitor.

## 2019-10-30 NOTE — TOC Progression Note (Addendum)
Transition of Care (TOC) - Progression Note    Patient Details  Name: George Wade. MRN: [id number] Date of Birth: [birth year]  Transition of Care [facility]) CM/SW Contact  [name], RN Phone Number: 10/30/2019, 6:23 PM  Clinical Narrative:    Patient will need to [facility] to shelter [facility] [place] , When NCM asked patient about Medicaid approval , he states that is what he was told but he does not have the number.   NCM will check with financial counselor tomorrow to see if he has a Medicaid number.   1/20 NCM spoke with Nita in financial counseling, she states she shows a medicaid pending number  [id number] Jerilynn Mages  and Jacklyn Shell in [facility] is his case worker [phone].  NCM contacted Amy, she states actually his Medicaid has been denied, she has requested an appeal a week ago and this can take up to 30 days.  They will have a hearing in 3 weeks.  So patient does not have a pending number because of denial.  Expected Discharge Plan: [place] Barriers to Discharge: Inadequate or no insurance, Continued Medical Work up  Expected Discharge Plan and Services Expected Discharge Plan: [place]       Living arrangements for the past 2 months: Hotel/Motel                                       Social Determinants of Health (SDOH) Interventions    Readmission Risk Interventions No flowsheet data found.

## 2019-10-30 NOTE — Progress Notes (Signed)
Transported to endoscopy dept. By wheelchair awake and alert .

## 2019-10-31 MED ORDER — APIXABAN 5 MG PO TABS
5.0000 mg | ORAL_TABLET | Freq: Two times a day (BID) | ORAL | Status: DC
  Administered 2019-10-31 – 2019-11-03 (×5): 5 mg via ORAL
  Filled 2019-10-31 (×7): qty 1

## 2019-10-31 MED ORDER — QUETIAPINE FUMARATE 50 MG PO TABS
50.0000 mg | ORAL_TABLET | Freq: Every day | ORAL | Status: DC
  Administered 2019-10-31 – 2019-11-02 (×3): 50 mg via JEJUNOSTOMY
  Filled 2019-10-31 (×3): qty 1

## 2019-10-31 NOTE — Progress Notes (Signed)
Claimed that his appetite is not good , felt nauseous and with constant right abdominal pain despite pain med. given regularly. Discussed with TCT PA. Pt. refused diet to be advanced to solid  as ordered by GI.

## 2019-10-31 NOTE — Progress Notes (Signed)
[facility] Gastroenterology Progress Note  George Wade. 64 y.o. 05/03/1956  CC: Esophageal perforation, s/p repair, esophageal stricture   Subjective: Patient seen and examined at bedside.  S/p esophageal stent placement yesterday for refractory esophageal stricture.  He is complaining of ongoing nausea and vomiting.  Complaining of epigastric discomfort.  ROS : Afebrile.  Negative for chest pain.  Somewhat anxious  Objective: Vital signs in last 24 hours: Vitals:   10/31/19 0735 10/31/19 1100  BP: 115/86 119/90  Pulse: 96 84  Resp: 16 17  Temp: 98.7 F (37.1 C) 98.3 F (36.8 C)  SpO2: 94% 97%    Physical Exam:  General.  Somewhat sleepy.  Not in acute distress Abdomen.  Epigastric tenderness to palpation, soft, nondistended, bowel sounds present.  No peritoneal signs Psych.  Mood and affect normal Neuro.  Alert/oriented x3  Lab Results: No results for input(s): NA, K, CL, CO2, GLUCOSE, BUN, CREATININE, CALCIUM, MG, PHOS in the last 72 hours. No results for input(s): AST, ALT, ALKPHOS, BILITOT, PROT, ALBUMIN in the last 72 hours. No results for input(s): WBC, NEUTROABS, HGB, HCT, MCV, PLT in the last 72 hours. No results for input(s): LABPROT, INR in the last 72 hours.    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   -Refractory esophageal stricture.  No improvement with multiple dilation.  S/p esophageal stent placement yesterday.  -History of pulmonary embolism.  Recommendations ------------------------ -His epigastric pain could be from stent expansion  -Continue IV twice daily PPI.  Continue Carafate. -Advance diet to full liquid.  Slowly advance diet as tolerated. -If ongoing epigastric discomfort, we will get abdominal x-ray tomorrow. -GI will follow   Otis Brace MD, [place] 10/31/2019, 2:33 PM  Contact #  [phone]

## 2019-10-31 NOTE — Plan of Care (Signed)
  Problem: Clinical Measurements: Goal: Will remain free from infection Outcome: Progressing Goal: Diagnostic test results will improve Outcome: Progressing Goal: Cardiovascular complication will be avoided Outcome: Progressing   Problem: Activity: Goal: Risk for activity intolerance will decrease Outcome: Progressing   Problem: Nutrition: Goal: Adequate nutrition will be maintained Outcome: Progressing   Problem: Coping: Goal: Level of anxiety will decrease Outcome: Progressing   Problem: Elimination: Goal: Will not experience complications related to bowel motility Outcome: Progressing Goal: Will not experience complications related to urinary retention Outcome: Progressing   Problem: Pain Managment: Goal: General experience of comfort will improve Outcome: Progressing   Problem: Safety: Goal: Ability to remain free from injury will improve Outcome: Progressing   Problem: Skin Integrity: Goal: Risk for impaired skin integrity will decrease Outcome: Progressing   Problem: Bowel/Gastric: Goal: Gastrointestinal status for postoperative course will improve Outcome: Progressing   Problem: Nutritional: Goal: Ability to achieve adequate nutritional intake will improve Outcome: Progressing   Problem: Clinical Measurements: Goal: Postoperative complications will be avoided or minimized Outcome: Progressing   Problem: Respiratory: Goal: Ability to maintain a clear airway will improve Outcome: Progressing   Problem: Skin Integrity: Goal: Demonstration of wound healing without infection will improve Outcome: Progressing

## 2019-10-31 NOTE — Progress Notes (Signed)
1 Day Post-Op Procedure(s) (LRB): ESOPHAGOGASTRODUODENOSCOPY (EGD) WITH PROPOFOL (N/A) Subjective: S/P placement of esophageal stent yesterday.  C/O nausea and epigastric discomfort this am. He has been able to swallow clear liquids, he has not attempted to advance diet yet.    Objective: Vital signs in last 24 hours: Temp:  [97.7 F (36.5 C)-98.7 F (37.1 C)] 98.7 F (37.1 C) (01/20 0735) Pulse Rate:  [77-96] 96 (01/20 0735) Cardiac Rhythm: Normal sinus rhythm (01/20 0757) Resp:  [10-19] 16 (01/20 0735) BP: (97-121)/(62-91) 115/86 (01/20 0735) SpO2:  [92 %-99 %] 94 % (01/20 0735) Weight:  [70.7 kg] 70.7 kg (01/20 0532)     Intake/Output from previous day: 01/19 0701 - 01/20 0700 In: 437 [P.O.:200; NG/GT:237] Out: -  Intake/Output this shift: No intake/output data recorded.  General appearance: alert, cooperative and moderate distress Neurologic: intact Heart: regular rate and rhythm Lungs: Breath sounds clear.  Abdomen: soft, mild discomfort with palpation over RUQ and epigastrum.   Lab Results: No results for input(s): WBC, HGB, HCT, PLT in the last 72 hours. BMET: No results for input(s): NA, K, CL, CO2, GLUCOSE, BUN, CREATININE, CALCIUM in the last 72 hours.  PT/INR: No results for input(s): LABPROT, INR in the last 72 hours. ABG    Component Value Date/Time   PHART 7.432 09/14/2019 1405   HCO3 24.1 09/14/2019 1405   TCO2 28 09/04/2019 1241   ACIDBASEDEF 6.0 (H) 09/04/2019 0522   O2SAT 95.4 09/14/2019 1405   CBG (last 3)  No results for input(s): GLUCAP in the last 72 hours.  Assessment/Plan: S/P Procedure(s) (LRB): ESOPHAGOGASTRODUODENOSCOPY (EGD) WITH PROPOFOL (N/A)  -POD-57right thoracotomy, repair of esophogeal perforation with intercostal muscle flap. -POD-35EGD with dilation of distal esophageal stricture to 41mm . -POD-21repeat EGD with dilation#2of distal esophageal stricture to 9mm. -POD-17EGD with dilation #3 to 11mm by Dr.  Penelope Coop -POD-15EGD with dilation#4to 13.64mm by Dr. Therisa Doyne. -POD-12EGDwith dilation #5to 71mm by Dr. Therisa Doyne.  -POD-1 EGD with placement of distal esophageal stent by Dr. Watt Climes.  Nausea and some vague upper abdominal discomfort this morning. Will defer eval. to GI service.  He has been able to swallow clear liquids but has not attempted advancing his diet.   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 Appointment is on 1/29 at 9:30amwith Dr. Salome Arnt.  -CM team states he will be discharged to a shelter. Pt says he was contacted by the staff at [facility] who had assisted him with applying for Medicaid and was told he had been approved.  -h/o alcohol abuse   LOS: 58 days    George Wade, [place] [phone] 10/31/2019

## 2019-11-01 LAB — CBC
HCT: 29.7 % — ABNORMAL LOW (ref 39.0–52.0)
Hemoglobin: 9 g/dL — ABNORMAL LOW (ref 13.0–17.0)
MCH: 24.9 pg — ABNORMAL LOW (ref 26.0–34.0)
MCHC: 30.3 g/dL (ref 30.0–36.0)
MCV: 82 fL (ref 80.0–100.0)
Platelets: 279 10*3/uL (ref 150–400)
RBC: 3.62 MIL/uL — ABNORMAL LOW (ref 4.22–5.81)
RDW: 16.3 % — ABNORMAL HIGH (ref 11.5–15.5)
WBC: 9.6 10*3/uL (ref 4.0–10.5)
nRBC: 0 % (ref 0.0–0.2)

## 2019-11-01 LAB — BASIC METABOLIC PANEL
Anion gap: 7 (ref 5–15)
BUN: 10 mg/dL (ref 8–23)
CO2: 26 mmol/L (ref 22–32)
Calcium: 9.3 mg/dL (ref 8.9–10.3)
Chloride: 101 mmol/L (ref 98–111)
Creatinine, Ser: 0.46 mg/dL — ABNORMAL LOW (ref 0.61–1.24)
GFR calc Af Amer: >60 mL/min (ref >60)
GFR calc non Af Amer: >60 mL/min (ref >60)
Glucose, Bld: 94 mg/dL (ref 70–99)
Potassium: 4.1 mmol/L (ref 3.5–5.1)
Sodium: 134 mmol/L — ABNORMAL LOW (ref 135–145)

## 2019-11-01 NOTE — Anesthesia Postprocedure Evaluation (Signed)
Anesthesia Post Note  Patient: George Wade.  Procedure(s) Performed: ESOPHAGOGASTRODUODENOSCOPY (EGD) WITH PROPOFOL (N/A ) ESOPHAGEAL STENT PLACEMENT (N/A )     Patient location during evaluation: Endoscopy Anesthesia Type: MAC Level of consciousness: awake and alert Pain management: pain level controlled Vital Signs Assessment: post-procedure vital signs reviewed and stable Respiratory status: spontaneous breathing, nonlabored ventilation, respiratory function stable and patient connected to nasal cannula oxygen Cardiovascular status: blood pressure returned to baseline and stable Postop Assessment: no apparent nausea or vomiting Anesthetic complications: no    Last Vitals:  Vitals:   11/01/19 0715 11/01/19 0851  BP:  106/80  Pulse:  100  Resp: 17 19  Temp:  36.8 C  SpO2:      Last Pain:  Vitals:   11/01/19 0851  TempSrc: Oral  PainSc:                  Anmoore S

## 2019-11-01 NOTE — Progress Notes (Signed)
Patient reports having rectal bleeding, history of anal lesions.  Expressed concern regarding going to a shelter where he will not have the facilities to properly care for himself

## 2019-11-01 NOTE — Plan of Care (Signed)
  Problem: Clinical Measurements: Goal: Will remain free from infection Outcome: Progressing Goal: Diagnostic test results will improve Outcome: Progressing Goal: Cardiovascular complication will be avoided Outcome: Progressing   Problem: Activity: Goal: Risk for activity intolerance will decrease Outcome: Progressing   Problem: Nutrition: Goal: Adequate nutrition will be maintained Outcome: Progressing   Problem: Coping: Goal: Level of anxiety will decrease Outcome: Progressing   Problem: Elimination: Goal: Will not experience complications related to bowel motility Outcome: Progressing Goal: Will not experience complications related to urinary retention Outcome: Progressing   Problem: Pain Managment: Goal: General experience of comfort will improve Outcome: Progressing   Problem: Safety: Goal: Ability to remain free from injury will improve Outcome: Progressing   Problem: Skin Integrity: Goal: Risk for impaired skin integrity will decrease Outcome: Progressing   Problem: Bowel/Gastric: Goal: Gastrointestinal status for postoperative course will improve Outcome: Progressing   Problem: Nutritional: Goal: Ability to achieve adequate nutritional intake will improve Outcome: Progressing   Problem: Clinical Measurements: Goal: Postoperative complications will be avoided or minimized Outcome: Progressing   Problem: Respiratory: Goal: Ability to maintain a clear airway will improve Outcome: Progressing   Problem: Skin Integrity: Goal: Demonstration of wound healing without infection will improve Outcome: Progressing

## 2019-11-01 NOTE — Progress Notes (Addendum)
[address]       [place] [address]             [phone]      2 Days Post-Op Procedure(s) (LRB): ESOPHAGOGASTRODUODENOSCOPY (EGD) WITH PROPOFOL (N/A) ESOPHAGEAL STENT PLACEMENT (N/A) Subjective: Feels okay this morning. Shares that he had two bouts of bloody diarrhea with a "significant" amount of blood in the commode.   Objective: Vital signs in last 24 hours: Temp:  [98 F (36.7 C)-98.3 F (36.8 C)] 98.3 F (36.8 C) (01/21 0851) Pulse Rate:  [74-100] 100 (01/21 0851) Cardiac Rhythm: Sinus tachycardia (01/21 0700) Resp:  [13-30] 19 (01/21 0851) BP: (92-119)/(69-90) 106/80 (01/21 0851) SpO2:  [97 %-99 %] 99 % (01/20 2032) Weight:  [70.6 kg] 70.6 kg (01/21 0351)     Intake/Output from previous day: 01/20 0701 - 01/21 0700 In: 624 [NG/GT:564] Out: -  Intake/Output this shift: No intake/output data recorded.  General appearance: alert, cooperative and no distress Heart: regular rate and rhythm, S1, S2 normal, no murmur, click, rub or gallop Lungs: clear to auscultation bilaterally Abdomen: soft, limited bowel sounds Extremities: extremities normal, atraumatic, no cyanosis or edema Wound: clean and dry  Lab Results: Recent Labs    11/01/19 0520  WBC 9.6  HGB 9.0*  HCT 29.7*  PLT 279   BMET:  Recent Labs    11/01/19 0520  NA 134*  K 4.1  CL 101  CO2 26  GLUCOSE 94  BUN 10  CREATININE 0.46*  CALCIUM 9.3    PT/INR: No results for input(s): LABPROT, INR in the last 72 hours. ABG    Component Value Date/Time   PHART 7.432 09/14/2019 1405   HCO3 24.1 09/14/2019 1405   TCO2 28 09/04/2019 1241   ACIDBASEDEF 6.0 (H) 09/04/2019 0522   O2SAT 95.4 09/14/2019 1405   CBG (last 3)  No results for input(s): GLUCAP in the last 72 hours.  Assessment/Plan: S/P Procedure(s) (LRB): ESOPHAGOGASTRODUODENOSCOPY (EGD) WITH PROPOFOL (N/A) ESOPHAGEAL STENT PLACEMENT (N/A)  -POD-57right thoracotomy, repair of esophogeal perforation with  intercostal muscle flap. -POD-35EGD with dilation of distal esophageal stricture to 19mm . -POD-21repeat EGD with dilation#2of distal esophageal stricture to 7mm. -POD-17EGD with dilation #3 to 58mm by Dr. Penelope Coop -POD-15EGD with dilation#4to 13.82mm by Dr. Therisa Doyne. -POD-12EGDwith dilation #5to 53mm by Dr. Therisa Doyne.  -POD-1 EGD with placement of distal esophageal stent by Dr. Watt Climes.  -Feels better and able to swallow grits this morning with his coffee. Some upper right quadrant abdominal pain which become more severe with drinking fluids but eventually subsides.   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 H and H 9.0/29.7 this morning. Bleeding appears to have stopped. New diapers ordered for his room. Will order another H and H for tomorrow morning.  [facility] on 1/[facility]   -CM working on placement. Patient is uneasy about being discharged to a shelter due to unsanitary conditions. Would prefer a hotel/motel on [facility] if possible so he may care for himself.   -Continue medical care. H and H for the morning. Continue to progress diet as tolerated. Able to tolerate grits this morning.    LOS: 53 days    George Wade 11/01/2019 Nausea is better and was able to eat breakfast. 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, MD [facility] [phone]

## 2019-11-01 NOTE — TOC Progression Note (Addendum)
Transition of Care (TOC) - Progression Note    Patient Details  Name: George Wade. MRN: [id number] Date of Birth: 24-Nov-1955  Transition of Care [facility]) CM/SW Contact  [name], RN Phone Number: 11/01/2019, 3:24 PM  Clinical Narrative:    NCM received a call from [place]  [phone].  She states she will try to help [place] with this patient with a hotel which will be at [place] off [address] and [address] but will only be for about 2 weeks, but he will have to be able to get his own food, because the hotel will not get the food for him.  They do not provide transportation.  She states NCM may also want to look into [facility] Letitia Libra [phone].  NCM called and left message for Hilda Blades to return call to see if they would be able to help with long term  Housing. Hilda Blades states they can not help with long term housing at this time. She told me to check back with [facility].  NCM contacted [facility] this am, and they said they are all full, but added his name to the list to call when a room becomes available.  NCM contacted Zach with [employer] about the tube feeds supplies.  Patient has rolling walker in his room and food supplies.  NCM contacted Mayanne , the congregational Nurse left vm to let her know patient is for dc toay. Awaiting call back. NCM contacted Butch Penny with with [facility] for [facility], she states the [facility] can make a couple of visits, once I get the address will give to Butch Penny with [facility] and Zach with [employer].  The address is [address], [name] [phone]. [phone].   Maryanne the Congregational Nurse will bring consent form for patient to sign and give him a $50.00 [employer] gift card for food and toiletry and gas. The hotel was full today 1/22, [name] will call tomorrow 1/23 in the morning to see if they have a room available and then she will call the Case Manager on the weekend to let them know .  Then the Case Manager will call [name] at  [phone] to let him know if patient can be discharged.  Patient will take a cab to [place] to get his truck which he will pay for and then drive to the hotel , he will have to go to hotel after 3 pm if room is available.    Expected Discharge Plan: [place] Barriers to Discharge: Inadequate or no insurance, Continued Medical Work up  Expected Discharge Plan and Services Expected Discharge Plan: [place] arrangements for the past 2 months: Hotel/Motel                                       Social Determinants of Health (SDOH) Interventions    Readmission Risk Interventions No flowsheet data found.

## 2019-11-01 NOTE — Progress Notes (Signed)
[facility] Progress Note  George Wade. 65 y.o. Mar 07, 1956  CC: Esophageal perforation, s/p repair, esophageal stricture   Subjective: Patient seen and examined at bedside.  Epigastric discomfort has improved.  Able to tolerate oral intake now.  Complaining of rectal bleeding.  ROS : Afebrile.  Negative for chest pain.  Somewhat anxious  Objective: Vital signs in last 24 hours: Vitals:   11/01/19 0851 11/01/19 1224  BP: 106/80 113/79  Pulse: 100 90  Resp: 19 (!) 21  Temp: 98.3 F (36.8 C) 98.3 F (36.8 C)  SpO2:      Physical Exam:  General.  Somewhat sleepy.  Not in acute distress Abdomen.  Epigastric discomfort to palpation, soft, nondistended, bowel sounds present.  No peritoneal signs Psych.  Mood and affect normal Neuro.  Alert/oriented x3  Lab Results: Recent Labs    11/01/19 0520  NA 134*  K 4.1  CL 101  CO2 26  GLUCOSE 94  BUN 10  CREATININE 0.46*  CALCIUM 9.3   No results for input(s): AST, ALT, ALKPHOS, BILITOT, PROT, ALBUMIN in the last 72 hours. Recent Labs    11/01/19 0520  WBC 9.6  HGB 9.0*  HCT 29.7*  MCV 82.0  PLT 279   No results for input(s): LABPROT, INR in the last 72 hours.    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   -Refractory esophageal stricture.  No improvement with multiple dilation.  S/p esophageal stent placement 10/31/2019  -History of pulmonary embolism.  -Rectal bleeding.  Patient with known rectal mass/condyloma.  Has a follow-up scheduled at [facility] on January 29.  Recommendations ------------------------ -Patient's hemoglobin is stable.  Tolerating oral diet now. -Continue current management -Okay to discharge from GI standpoint when placement is ready.  Recommend to discharge him on Protonix 40 mg twice a day and Carafate suspension 3 times a day for 4 weeks.    Otis Brace MD, [place] 11/01/2019, 12:51 PM  Contact #  [phone]

## 2019-11-02 LAB — HEMOGLOBIN AND HEMATOCRIT, BLOOD
HCT: 30.4 % — ABNORMAL LOW (ref 39.0–52.0)
Hemoglobin: 9.1 g/dL — ABNORMAL LOW (ref 13.0–17.0)

## 2019-11-02 MED ORDER — TRAMADOL HCL 50 MG PO TABS: 50.0000 mg | ORAL_TABLET | Freq: Four times a day (QID) | ORAL | 0 refills | Status: AC | PRN

## 2019-11-02 MED ORDER — SUCRALFATE 1 GM/10ML PO SUSP: 1.0000 g | Freq: Three times a day (TID) | ORAL | 2 refills | Status: AC

## 2019-11-02 MED ORDER — APIXABAN 5 MG PO TABS: 5.0000 mg | ORAL_TABLET | Freq: Two times a day (BID) | ORAL | 5 refills | Status: AC

## 2019-11-02 MED ORDER — LOPERAMIDE HCL 1 MG/7.5ML PO SUSP: 2.0000 mg | ORAL | 0 refills | Status: AC | PRN

## 2019-11-02 MED ORDER — FERROUS SULFATE 300 (60 FE) MG/5ML PO SYRP: 300.0000 mg | ORAL_SOLUTION | Freq: Three times a day (TID) | ORAL | 3 refills | Status: AC

## 2019-11-02 MED ORDER — QUETIAPINE FUMARATE 25 MG PO TABS: 25.0000 mg | ORAL_TABLET | Freq: Every day | ORAL | 0 refills | Status: AC

## 2019-11-02 MED ORDER — PANTOPRAZOLE SODIUM 40 MG PO TBEC: 40.0000 mg | DELAYED_RELEASE_TABLET | Freq: Two times a day (BID) | ORAL | 1 refills | Status: AC

## 2019-11-02 MED FILL — traMADol HCL 50 MG TABS: 50 | 7 days supply | Qty: 28 | Fill #0

## 2019-11-02 MED FILL — PANTOPRAZOLE SOD DR 40 MG T: 40 | 30 days supply | Qty: 60 | Fill #0

## 2019-11-02 MED FILL — QUETIAPINE FUMARATE 25 MG T: 25 | 7 days supply | Qty: 7 | Fill #0

## 2019-11-02 MED FILL — ELIQUIS 5 MG TABLET: 5 | 30 days supply | Qty: 60 | Fill #0

## 2019-11-02 MED FILL — SUCRALFATE 1 GM/10ML SUSP: 1 | 11 days supply | Qty: 420 | Fill #0

## 2019-11-02 NOTE — TOC Progression Note (Signed)
Transition of Care (TOC) - Progression Note    Patient Details  Name: George Wade. MRN: [id number] Date of Birth: [birth year]  Transition of Care [facility]) CM/SW [place], [place] Phone Number: [phone] 11/02/2019, 1:25 PM  Clinical Narrative:     CSW dropped off bag of clothes to patient's room including long sleeve shirt, sweat shirt, 2 pairs of socks, 1 pair of gloves, one winter beanie, and a scarf. Patient appreciative of items.   Expected Discharge Plan: [place] Barriers to Discharge: Inadequate or no insurance, Continued Medical Work up  Expected Discharge Plan and Services Expected Discharge Plan: [place] arrangements for the past 2 months: Hotel/Motel                                       Social Determinants of Health (SDOH) Interventions    Readmission Risk Interventions No flowsheet data found.

## 2019-11-02 NOTE — Discharge Instructions (Signed)
Dysphagia Eating Plan, Bite Size Food This diet plan is for people with moderate swallowing problems who have transitioned from pureed and minced foods. Bite size foods are soft and cut into small chunks so that they can be swallowed safely. On this eating plan, you may be instructed to drink liquids that are thickened. Work with your health care provider and your diet and nutrition specialist (dietitian) to make sure that you are following the diet safely and getting all the nutrients you need. What are tips for following this plan? General guidelines for foods   You may eat foods that are tender, soft, and moist.  Always test food texture before taking a bite. Poke food with a fork or spoon to make sure it is tender.  Food should be easy to cut and shew. Avoid large pieces of food that require a lot of chewing.  Take small bites. Each bite should be smaller than your thumb nail (about 65mm by 15 mm).  If you were on pureed and minced food diet plans, you may eat any of the foods included in those diets.  Avoid foods that are very dry, hard, sticky, chewy, coarse, or crunchy.  If instructed by your health care provider, thicken liquids. Follow your health care provider's instructions for what products to use, how to do this, and to what thickness. ? Your health care provider may recommend using a commercial thickener, rice cereal, or potato flakes. Ask your health care provider to recommend thickeners. ? Thickened liquids are usually a pudding-like consistency, or they may be as thick as honey or thick enough to eat with a spoon. Cooking  To moisten foods, you may add liquids while you are blending, mashing, or grinding your foods to the right consistency. These liquids include gravies, sauces, vegetable or fruit juice, milk, half and half, or water.  Strain extra liquid from foods before eating.  Reheat foods slowly to prevent a tough crust from forming.  Prepare foods in  advance. Meal planning  Eat a variety of foods to get all the nutrients you need.  Some foods may be tolerated better than others. Work with your dietitian to identify which foods are safest for you to eat.  Follow your meal plan as told by your dietitian. What foods are allowed? Grains Moist breads without nuts or seeds. Biscuits, muffins, pancakes, and waffles that are well-moistened with syrup, jelly, margarine, or butter. Cooked cereals. Moist bread stuffing. Moist rice. Well-moistened cold cereal with small chunks. Well-cooked pasta, noodles, rice, and bread dressing in small pieces and thick sauce. Soft dumplings or spaetzle in small pieces and butter or gravy. Vegetables Soft, well-cooked vegetables in small pieces. Soft-cooked, mashed potatoes. Thickened vegetable juice. Fruits Canned or cooked fruits that are soft or moist and do not have skin or seeds. Fresh, soft bananas. Thickened fruit juices. Meat and other protein foods Tender, moist meats or poultry in small pieces. Moist meatballs or meatloaf. Fish without bones. Eggs or egg substitutes in small pieces. Tofu. Tempeh and meat alternatives in small pieces. Well-cooked, tender beans, peas, baked beans, and other legumes. Dairy Thickened milk. Cream cheese. Yogurt. Cottage cheese. Sour cream. Small pieces of soft cheese. Fats and oils Butter. Oils. Margarine. Mayonnaise. Gravy. Spreads. Sweets and desserts Soft, smooth, moist desserts. Pudding. Custard. Moist cakes. Jam. Jelly. Honey. Preserves. Ask your health care provider whether you can have frozen desserts. Seasoning and other foods All seasonings and sweeteners. All sauces with small chunks. Prepared tuna, egg, or chicken  salad without raw fruits or vegetables. Moist casseroles with small, tender pieces of meat. Soups with tender meat. What foods are not allowed? Grains Coarse or dry cereals. Dry breads. Toast. Crackers. Tough, crusty breads, such as [place] bread and  baguettes. Dry pancakes, waffles, and muffins. Sticky rice. Dry bread stuffing. Granola. Popcorn. Chips. Vegetables All raw vegetables. Cooked corn. Rubbery or stiff cooked vegetables. Stringy vegetables, such as celery. Tough, crisp fried potatoes. Potato skins. Fruits Hard, crunchy, stringy, high-pulp, and juicy raw fruits such as apples, pineapple, papaya, and watermelon. Small, round fruits, such as grapes. Dried fruit and fruit leather. Meat and other protein foods Large pieces of meat. Dry, tough meats, such as bacon, sausage, and hot dogs. Chicken, [place], or fish with skin and bones. Crunchy peanut butter. Nuts. Seeds. Nut and seed butters. Dairy Yogurt with nuts, seeds, or large chunks. Large chunks of cheese. Frozen desserts and milk consistency not allowed by your dietitian. Sweets and desserts Dry cakes. Chewy or dry cookies. Any desserts with nuts, seeds, dry fruits, coconut, pineapple, or anything dry, sticky, or hard. Chewy caramel. Licorice. Taffy-type candies. Ask your health care provider whether you can have frozen desserts. Seasoning and other foods Soups with tough or large chunks of meats, poultry, or vegetables. Corn or clam chowder. Smoothies with large chunks of fruit. Summary  Bite size foods can be helpful for people with moderate swallowing problems.  On the dysphagia eating plan, you may eat foods that are soft, moist, and cut into pieces smaller than 57mm by 59mm.  You may be instructed to thicken liquids. Follow your health care provider's instructions about how to do this and to what consistency. This information is not intended to replace advice given to you by your health care provider. Make sure you discuss any questions you have with your health care provider. Document Revised: 01/18/2019 Document Reviewed: 01/07/2017 Elsevier Patient Education  [place].   Esophageal Dilatation Esophageal dilatation, also called esophageal dilation, is a  procedure to widen or open (dilate) a blocked or narrowed part of the esophagus. The esophagus is the part of the body that moves food and liquid from the mouth to the stomach. You may need this procedure if:  You have a buildup of scar tissue in your esophagus that makes it difficult, painful, or impossible to swallow. This can be caused by gastroesophageal reflux disease (GERD).  You have cancer of the esophagus.  There is a problem with how food moves through your esophagus. In some cases, you may need this procedure repeated at a later time to dilate the esophagus gradually. Tell a health care provider about:  Any allergies you have.  All medicines you are taking, including vitamins, herbs, eye drops, creams, and over-the-counter medicines.  Any problems you or family members have had with anesthetic medicines.  Any blood disorders you have.  Any surgeries you have had.  Any medical conditions you have.  Any antibiotic medicines you are required to take before dental procedures.  Whether you are pregnant or may be pregnant. What are the risks? Generally, this is a safe procedure. However, problems may occur, including:  Bleeding due to a tear in the lining of the esophagus.  A hole (perforation) in the esophagus. What happens before the procedure?  Follow instructions from your health care provider about eating or drinking restrictions.  Ask your health care provider about changing or stopping your regular medicines. This is especially important if you are taking diabetes medicines or  blood thinners.  Plan to have someone take you home from the hospital or clinic.  Plan to have a responsible adult care for you for at least 24 hours after you leave the hospital or clinic. This is important. What happens during the procedure?  You may be given a medicine to help you relax (sedative).  A numbing medicine may be sprayed into the back of your throat, or you may gargle the  medicine.  Your health care provider may perform the dilatation using various surgical instruments, such as: ? Simple dilators. This instrument is carefully placed in the esophagus to stretch it. ? Guided wire bougies. This involves using an endoscope to insert a wire into the esophagus. A dilator is passed over this wire to enlarge the esophagus. Then the wire is removed. ? Balloon dilators. An endoscope with a small balloon at the end is inserted into the esophagus. The balloon is inflated to stretch the esophagus and open it up. The procedure may vary among health care providers and hospitals. What happens after the procedure?  Your blood pressure, heart rate, breathing rate, and blood oxygen level will be monitored until the medicines you were given have worn off.  Your throat may feel slightly sore and numb. This will improve slowly over time.  You will not be allowed to eat or drink until your throat is no longer numb.  When you are able to drink, urinate, and sit on the edge of the bed without nausea or dizziness, you may be able to return home. Follow these instructions at home:  Take over-the-counter and prescription medicines only as told by your health care provider.  Do not drive for 24 hours if you were given a sedative during your procedure.  You should have a responsible adult with you for 24 hours after the procedure.  Follow instructions from your health care provider about any eating or drinking restrictions.  Do not use any products that contain nicotine or tobacco, such as cigarettes and e-cigarettes. If you need help quitting, ask your health care provider.  Keep all follow-up visits as told by your health care provider. This is important. Get help right away if you:  Have a fever.  Have chest pain.  Have pain that is not relieved by medication.  Have trouble breathing.  Have trouble swallowing.  Vomit blood. Summary  Esophageal dilatation, also called  esophageal dilation, is a procedure to widen or open (dilate) a blocked or narrowed part of the esophagus.  Plan to have someone take you home from the hospital or clinic.  For this procedure, a numbing medicine may be sprayed into the back of your throat, or you may gargle the medicine.  Do not drive for 24 hours if you were given a sedative during your procedure. This information is not intended to replace advice given to you by your health care provider. Make sure you discuss any questions you have with your health care provider. Document Revised: 07/25/2019 Document Reviewed: 08/02/2017 Elsevier Patient Education  2020 [employer].

## 2019-11-02 NOTE — Progress Notes (Addendum)
3 Days Post-Op Procedure(s) (LRB): ESOPHAGOGASTRODUODENOSCOPY (EGD) WITH PROPOFOL (N/A) ESOPHAGEAL STENT PLACEMENT (N/A) Subjective: Complains of some discomfort in his right upper abdomen.  Says he did well with eating solid food yesterday. He is having bowel movements with some bleeding with each episode.   Objective: Vital signs in last 24 hours: Temp:  [98.2 F (36.8 C)-98.6 F (37 C)] 98.2 F (36.8 C) (01/22 0824) Pulse Rate:  [80-90] 80 (01/22 0824) Cardiac Rhythm: Normal sinus rhythm (01/22 0701) Resp:  [17-23] 22 (01/22 0824) BP: (111-120)/(74-90) 111/79 (01/22 0824) SpO2:  [98 %] 98 % (01/21 1945) Weight:  [71.2 kg] 71.2 kg (01/22 0700)   Intake/Output from previous day: 01/21 0701 - 01/22 0700 In: 320 [P.O.:320] Out: -  Intake/Output this shift: No intake/output data recorded.  Physical Exam: General appearance: alert, cooperative and no distress Heart: regular rate and rhythm Lungs: clear to auscultation bilaterally Abdomen: soft, flat, active bowel sounds, no tenderness. The J tube is secure and the site is clean and dry.   Lab Results: Recent Labs    11/01/19 0520 11/02/19 0558  WBC 9.6  --   HGB 9.0* 9.1*  HCT 29.7* 30.4*  PLT 279  --    BMET:  Recent Labs    11/01/19 0520  NA 134*  K 4.1  CL 101  CO2 26  GLUCOSE 94  BUN 10  CREATININE 0.46*  CALCIUM 9.3    PT/INR: No results for input(s): LABPROT, INR in the last 72 hours. ABG    Component Value Date/Time   PHART 7.432 09/14/2019 1405   HCO3 24.1 09/14/2019 1405   TCO2 28 09/04/2019 1241   ACIDBASEDEF 6.0 (H) 09/04/2019 0522   O2SAT 95.4 09/14/2019 1405   CBG (last 3)  No results for input(s): GLUCAP in the last 72 hours.  Assessment/Plan: S/P Procedure(s) (LRB): ESOPHAGOGASTRODUODENOSCOPY (EGD) WITH PROPOFOL (N/A) ESOPHAGEAL STENT PLACEMENT (N/A)   -POD-59right thoracotomy, repair of esophogeal perforation with intercostal muscle flap. -POD-37EGD with dilation of distal  esophageal stricture to 108mm . -POD-23repeat EGD with dilation#2of distal esophageal stricture to 71mm. -POD-19EGD with dilation #3 to 54mm by Dr. Penelope Coop -POD-17EGD with dilation#4to 13.76mm by Dr. Therisa Doyne. -POD-14EGDwith dilation #5to 89mm by Dr. Therisa Doyne. -POD-3 EGD with placement of distal esophageal stent by Dr. Watt Climes.  -Feels better and was able to tolerate PO's yesterday.  Some upper right quadrant abdominal discomfort but exam is benign.   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 H and H  Have actuall improved in recent days.   Outpatient appointment on 1/29 at [facility] with a colo-rectal surgeon.   -CM working on placement. Patient is uneasy about being discharged to a shelter due to unsanitary conditions. Would prefer a hotel/motel on charity if possible so he may care for himself.   -Cleared for discharge from CTS standpoint as well as the GI service. Discussed D/C plans with Ms. Tomi Bamberger (case manager),  and Mr. Rudd this morning. Working on final arangements for a hotel and tube feeding supplies with discharge possible later today or in the morning.    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, MD [facility] [phone]

## 2019-11-02 NOTE — Progress Notes (Signed)
Liliane Channel is going to be discharged tomorrow.  He is ready.  His nurse tech, his nurse, his PA and I found ourselves in Rick's room at the same time.  We all congratulated him on the progress he's made (physically, mentally and spiritually).  We rejoiced with him for getting his truck back, for the gift of being able to go a motel for the first couple weeks and for the very real possibility of being accepted into a [facility] soon.  We all joined hands and offered God our praises and thanksgiving for His kindness and gifts to [place].   God is Good!  De Burrs Chaplain Resident

## 2019-11-02 NOTE — Plan of Care (Signed)
  Problem: Clinical Measurements: Goal: Will remain free from infection Outcome: Progressing Goal: Diagnostic test results will improve Outcome: Progressing Goal: Cardiovascular complication will be avoided Outcome: Progressing   Problem: Activity: Goal: Risk for activity intolerance will decrease Outcome: Progressing   Problem: Nutrition: Goal: Adequate nutrition will be maintained Outcome: Progressing   Problem: Coping: Goal: Level of anxiety will decrease Outcome: Progressing   Problem: Elimination: Goal: Will not experience complications related to bowel motility Outcome: Progressing Goal: Will not experience complications related to urinary retention Outcome: Progressing   Problem: Pain Managment: Goal: General experience of comfort will improve Outcome: Progressing   Problem: Safety: Goal: Ability to remain free from injury will improve Outcome: Progressing   Problem: Skin Integrity: Goal: Risk for impaired skin integrity will decrease Outcome: Progressing   Problem: Bowel/Gastric: Goal: Gastrointestinal status for postoperative course will improve Outcome: Progressing   Problem: Nutritional: Goal: Ability to achieve adequate nutritional intake will improve Outcome: Progressing   Problem: Clinical Measurements: Goal: Postoperative complications will be avoided or minimized Outcome: Progressing   Problem: Respiratory: Goal: Ability to maintain a clear airway will improve Outcome: Progressing   Problem: Skin Integrity: Goal: Demonstration of wound healing without infection will improve Outcome: Progressing

## 2019-11-03 MED ORDER — FOLIC ACID 1 MG PO TABS
1.0000 mg | ORAL_TABLET | Freq: Every day | ORAL | Status: DC
  Administered 2019-11-03: 1 mg via ORAL
  Filled 2019-11-03: qty 1

## 2019-11-03 MED ORDER — PANTOPRAZOLE SODIUM 40 MG PO TBEC
40.0000 mg | DELAYED_RELEASE_TABLET | Freq: Two times a day (BID) | ORAL | Status: DC
  Filled 2019-11-03: qty 1

## 2019-11-03 NOTE — Progress Notes (Addendum)
Pt got discharged to motel, discharge instructions provided and patient showed understanding to it, PICC line DC by IV team,Telemonitor DC,pt left unit in wheelchair with all of the belongings. RN dropped him to the [employer] to his Cab.  Neil Crouch, RN

## 2019-11-03 NOTE — Plan of Care (Signed)
  Problem: Clinical Measurements: Goal: Will remain free from infection Outcome: Completed/Met Goal: Diagnostic test results will improve Outcome: Completed/Met Goal: Cardiovascular complication will be avoided Outcome: Completed/Met   Problem: Activity: Goal: Risk for activity intolerance will decrease Outcome: Completed/Met   Problem: Nutrition: Goal: Adequate nutrition will be maintained Outcome: Completed/Met   Problem: Coping: Goal: Level of anxiety will decrease Outcome: Completed/Met   Problem: Elimination: Goal: Will not experience complications related to bowel motility Outcome: Completed/Met Goal: Will not experience complications related to urinary retention Outcome: Completed/Met   Problem: Pain Managment: Goal: General experience of comfort will improve Outcome: Completed/Met   Problem: Safety: Goal: Ability to remain free from injury will improve Outcome: Completed/Met   Problem: Skin Integrity: Goal: Risk for impaired skin integrity will decrease Outcome: Completed/Met   Problem: Bowel/Gastric: Goal: Gastrointestinal status for postoperative course will improve Outcome: Completed/Met   Problem: Nutritional: Goal: Ability to achieve adequate nutritional intake will improve Outcome: Completed/Met   Problem: Clinical Measurements: Goal: Postoperative complications will be avoided or minimized Outcome: Completed/Met   Problem: Respiratory: Goal: Ability to maintain a clear airway will improve Outcome: Completed/Met   Problem: Skin Integrity: Goal: Demonstration of wound healing without infection will improve Outcome: Completed/Met   Problem: Acute Rehab PT Goals(only PT should resolve) Goal: Pt Will Go Up/Down Stairs Outcome: Completed/Met   Problem: Acute Rehab OT Goals (only OT should resolve) Goal: Pt. Will Perform Upper Body Bathing Outcome: Completed/Met Goal: Pt. Will Perform Lower Body Bathing Outcome: Completed/Met Goal: Pt. Will  Perform Upper Body Dressing Outcome: Completed/Met Goal: Pt. Will Perform Lower Body Dressing Outcome: Completed/Met Goal: Pt. Will Transfer To Toilet Outcome: Completed/Met Goal: Pt. Will Perform Toileting-Clothing Manipulation Outcome: Completed/Met   Problem: Acute Rehab OT Goals (only OT should resolve) Goal: Pt. Will Perform Lower Body Bathing Outcome: Completed/Met   Problem: Acute Rehab OT Goals (only OT should resolve) Goal: Pt. Will Perform Upper Body Dressing Outcome: Completed/Met   Problem: Acute Rehab OT Goals (only OT should resolve) Goal: Pt. Will Perform Lower Body Dressing Outcome: Completed/Met   Problem: Acute Rehab OT Goals (only OT should resolve) Goal: Pt. Will Transfer To Toilet Outcome: Completed/Met   Problem: Acute Rehab OT Goals (only OT should resolve) Goal: Pt. Will Perform Toileting-Clothing Manipulation Outcome: Completed/Met

## 2019-11-03 NOTE — Plan of Care (Signed)
  Problem: Clinical Measurements: Goal: Will remain free from infection Outcome: Progressing Goal: Diagnostic test results will improve Outcome: Progressing Goal: Cardiovascular complication will be avoided Outcome: Progressing   Problem: Activity: Goal: Risk for activity intolerance will decrease Outcome: Progressing   Problem: Nutrition: Goal: Adequate nutrition will be maintained Outcome: Progressing   Problem: Coping: Goal: Level of anxiety will decrease Outcome: Progressing   Problem: Elimination: Goal: Will not experience complications related to bowel motility Outcome: Progressing Goal: Will not experience complications related to urinary retention Outcome: Progressing   Problem: Pain Managment: Goal: General experience of comfort will improve Outcome: Progressing   Problem: Safety: Goal: Ability to remain free from injury will improve Outcome: Progressing   Problem: Skin Integrity: Goal: Risk for impaired skin integrity will decrease Outcome: Progressing   Problem: Bowel/Gastric: Goal: Gastrointestinal status for postoperative course will improve Outcome: Progressing   Problem: Nutritional: Goal: Ability to achieve adequate nutritional intake will improve Outcome: Progressing   Problem: Clinical Measurements: Goal: Postoperative complications will be avoided or minimized Outcome: Progressing   Problem: Respiratory: Goal: Ability to maintain a clear airway will improve Outcome: Progressing   Problem: Skin Integrity: Goal: Demonstration of wound healing without infection will improve Outcome: Progressing

## 2019-11-03 NOTE — Progress Notes (Addendum)
[address]       [place] [address]             [phone]      4 Days Post-Op Procedure(s) (LRB): ESOPHAGOGASTRODUODENOSCOPY (EGD) WITH PROPOFOL (N/A) ESOPHAGEAL STENT PLACEMENT (N/A) Subjective: Feels pretty well overall , no new specificissues  Objective: Vital signs in last 24 hours: Temp:  [97.4 F (36.3 C)-98.4 F (36.9 C)] 98.4 F (36.9 C) (01/23 0816) Pulse Rate:  [76-95] 80 (01/23 0816) Cardiac Rhythm: Normal sinus rhythm (01/23 0700) Resp:  [13-20] 15 (01/23 0816) BP: (90-117)/(65-89) 113/80 (01/23 0816) SpO2:  [98 %] 98 % (01/23 0816) Weight:  [70.4 kg] 70.4 kg (01/23 0428)  Hemodynamic parameters for last 24 hours:    Intake/Output from previous day: 01/22 0701 - 01/23 0700 In: 550 [P.O.:550] Out: -  Intake/Output this shift: No intake/output data recorded.  General appearance: alert, cooperative and no distress Heart: regular rate and rhythm Lungs: clear to auscultation bilaterally Abdomen: benign Extremities: no edema Wound: healing well  Lab Results: Recent Labs    11/01/19 0520 11/02/19 0558  WBC 9.6  --   HGB 9.0* 9.1*  HCT 29.7* 30.4*  PLT 279  --    BMET:  Recent Labs    11/01/19 0520  NA 134*  K 4.1  CL 101  CO2 26  GLUCOSE 94  BUN 10  CREATININE 0.46*  CALCIUM 9.3    PT/INR: No results for input(s): LABPROT, INR in the last 72 hours. ABG    Component Value Date/Time   PHART 7.432 09/14/2019 1405   HCO3 24.1 09/14/2019 1405   TCO2 28 09/04/2019 1241   ACIDBASEDEF 6.0 (H) 09/04/2019 0522   O2SAT 95.4 09/14/2019 1405   CBG (last 3)  No results for input(s): GLUCAP in the last 72 hours.  Meds Scheduled Meds: . acetaminophen (TYLENOL) oral liquid 160 mg/5 mL  650 mg Per Tube Q6H  . apixaban  5 mg Oral BID  . chlorhexidine  15 mL Mouth Rinse BID  . Chlorhexidine Gluconate Cloth  6 each Topical Daily  . feeding supplement (ENSURE ENLIVE)  237 mL Oral TID BM  . feeding supplement (OSMOLITE  1.5 CAL)  237 mL Per Tube QID  . feeding supplement (PRO-STAT SUGAR FREE 64)  30 mL Per Tube BID  . ferrous sulfate  300 mg Per Tube TID WC  . folic acid  1 mg Oral Daily  . mouth rinse  15 mL Mouth Rinse q12n4p  . pantoprazole  40 mg Oral BID AC  . QUEtiapine  50 mg Per J Tube QHS  . sodium chloride flush  10-40 mL Intracatheter Q12H  . sucralfate  1 g Oral TID WC & HS   Continuous Infusions: . sodium chloride Stopped (10/11/19 1800)  . lactated ringers 150 mL/hr at 10/30/19 1333   PRN Meds:.sodium chloride, acetaminophen, alum & mag hydroxide-simeth, guaiFENesin, hydrocortisone cream, iohexol, iohexol, ipratropium-albuterol, lidocaine, loperamide HCl, LORazepam, ondansetron (ZOFRAN) IV, oxyCODONE, sodium chloride flush, traMADol  Xrays No results found.  Assessment/Plan: S/P Procedure(s) (LRB): ESOPHAGOGASTRODUODENOSCOPY (EGD) WITH PROPOFOL (N/A) ESOPHAGEAL STENT PLACEMENT (N/A)  1 conts to be stable overall on current management . Hemodyn stable in sinus rhythm 2 waits bed in motel to become available- not clear if that will occur today  LOS: 61 days    George [facility] 11/03/2019 Pager [phone]  Patient seen and examined, agree with above Dc later today Will see surgeon at [facility];  anal condyloma I will see him back in a couple of weeks  George [place] C. George Hockey, MD [facility] [phone]

## 2019-11-03 NOTE — Care Management (Signed)
Received call from [facility], she will have room for patient after 2pm. She will call patient to notify him of room number. Patient states he has a few days of tube feeding supplies. 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 He will arrange his own cab for transportation to [place] to get his truck. I have notified [facility] that he will DC today.

## 2019-11-06 ENCOUNTER — Ambulatory Visit: Payer: Self-pay | Admitting: Thoracic Surgery (Cardiothoracic Vascular Surgery)

## 2019-11-06 NOTE — Congregational Nurse Program (Signed)
  Dept: [facility] Nurse Program Note  Date of Encounter: 11/06/2019  Past Medical History: Past Medical History:  Diagnosis Date  . Alcoholism with psychosis with complication, with hallucinations ([place])   . ED (erectile dysfunction)   . Fatigue   . History of pneumonia   . Hypercholesteremia   . Hypertension   . Lymphoma ([place]) 2008   non hodgkins lymphoma  . Rectal bleeding   . 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[facility])     Encounter Details: CNP Questionnaire - 11/06/19 0930      Questionnaire   Patient Status  Not Applicable    Race  White or Caucasian    Location Patient Served At  [employer] patient    Insurance  Not Applicable    Uninsured  Uninsured (NEW 1x/quarter)    Food  No food insecurities    Housing/Utilities  No permanent housing    Transportation  No transportation needs    Interpersonal Safety  Yes, feel physically and emotionally safe where you currently live    Medication  No medication insecurities    Medical Provider  No    Referrals  Primary Care Provider/Clinic;Area Agency    ED Visit Averted  Not Applicable    Life-Saving Intervention Made  Not Applicable     Initial visit to discuss [facility] program.  Patient verbalized understanding of program with no current questions.   Complain of some fatigue/weakness. Complaining of some upper abdominal "discomfort". Has Tramadol for pain. States not very effective.  Current feeding regimen: 3-4 Osmolite/day per G-tube.  Ensure, water, apple juice by mouth. Denies feeling hungry. G-tube site dry, no drainage on dressing.  Patient states he has not changed dressing but is able to. Dressing materials present in room. Is having some rectal bleeding and diarrhea.  Diarrhea 8-9 times per day.   Has appt in [place] on January 29 to follow up on known rectal mass/condyloma. Patient states he will drive to appointment and familiar with location.  Patient states he hopes to go to Rehab  with [facility] Encouraged patient to contact [facility] for services/hotel program.  Given address and phone number. Will plan to follow patient by phone and visit as needed.     Jalene Mullet, RN

## 2019-11-08 ENCOUNTER — Telehealth: Payer: Self-pay

## 2019-11-08 NOTE — Telephone Encounter (Signed)
Patient aware of appointment tomorrow. States he will plan to drive there.  He is concerned about rectal bleeding and comfortable waiting for MD appointment tomorrow.  Encouraged to drink plenty of fluids.

## 2019-11-12 ENCOUNTER — Ambulatory Visit (INDEPENDENT_AMBULATORY_CARE_PROVIDER_SITE_OTHER): Payer: Self-pay | Admitting: Primary Care

## 2019-11-12 ENCOUNTER — Other Ambulatory Visit: Payer: Self-pay

## 2019-11-12 ENCOUNTER — Encounter (INDEPENDENT_AMBULATORY_CARE_PROVIDER_SITE_OTHER): Payer: Self-pay | Admitting: Primary Care

## 2019-11-12 ENCOUNTER — Telehealth: Payer: Self-pay

## 2019-11-12 DIAGNOSIS — Z5900 Homelessness unspecified: Secondary | ICD-10-CM

## 2019-11-12 DIAGNOSIS — R150 Incomplete defecation: Secondary | ICD-10-CM

## 2019-11-12 DIAGNOSIS — Z09 Encounter for follow-up examination after completed treatment for conditions other than malignant neoplasm: Secondary | ICD-10-CM

## 2019-11-12 DIAGNOSIS — Z7689 Persons encountering health services in other specified circumstances: Secondary | ICD-10-CM

## 2019-11-12 NOTE — Progress Notes (Signed)
Pt is having rectal issues, hurts to sit or lay down. He is seeing dr Arsenio Loader for this

## 2019-11-12 NOTE — Progress Notes (Signed)
Virtual Visit via Telephone Note  I connected with [name]. on 11/12/19 at  9:30 AM EST by telephone and verified that I am speaking with the correct person using two identifiers.   I discussed the limitations, risks, security and privacy concerns of performing an evaluation and management service by telephone and the availability of in person appointments. I also discussed with the patient that there may be a patient responsible charge related to this service. The patient expressed understanding and agreed to proceed.   History of Present Illness: George [place]. Is having a tele visit to establish care and hospital follow up. Patient is homeless presently arrange to stay in [facility]  until Saturday only other choice is to stay in his truck. The last time he was at a shelter residents was offered to use drugs and prefers not to be in that environment. Past Medical History:  Diagnosis Date  . Alcoholism with psychosis with complication, with hallucinations ([place])   . ED (erectile dysfunction)   . Fatigue   . History of pneumonia   . Hypercholesteremia   . Hypertension   . Lymphoma ([place]) 2008   non hodgkins lymphoma  . Rectal bleeding   . 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 ferrous sulfate 300 (60 Fe) MG/5ML syrup Place 5 mLs (300 mg total) into feeding tube 3 (three) times daily with meals. 150 mL 3  . loperamide HCl (IMODIUM) 1 MG/7.5ML suspension Place 15 mLs (2 mg total) into feeding tube as needed for diarrhea or loose stools. 120 mL 0  . pantoprazole (PROTONIX) 40 MG tablet Take 1 tablet (40 mg total) by mouth 2 (two) times daily. 60 tablet 1  . Water For Irrigation, Sterile (FREE WATER) SOLN Place 100 mLs into feeding tube every 4 (four) hours.    Marland Kitchen apixaban (ELIQUIS) 5 MG TABS tablet Take 1 tablet (5 mg total) by mouth 2 (two) times daily. Continue  this medication for 6 months. (Patient not taking: Reported on 11/12/2019) 60 tablet 5  . QUEtiapine (SEROQUEL) 25 MG tablet Take 1 tablet (25 mg total) by mouth at bedtime for 7 days. 7 tablet 0  . sucralfate (CARAFATE) 1 GM/10ML suspension Take 10 mLs (1 g total) by mouth 4 (four) times daily -  with meals and at bedtime. (Patient not taking: Reported on 11/12/2019) 420 mL 2   No current facility-administered medications on file prior to visit.   Observations/Objective: [name] was seen today for hospitalization follow-up.  Diagnoses and all orders for this visit:  Encounter to establish care Juluis Mire, NP-C will be your  (PCP) mastered prepared that is able to that will  diagnosed and treatment able to answer health concern as well as continuing care of varied medical conditions, not limited by cause, organ system, or diagnosis.   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   Assessment and Plan: Schedule follow up appointment with CSW and Congregational nurse  Follow Up Instructions:    I discussed the assessment and treatment plan with the patient. The patient was provided an opportunity to ask questions and all were answered. The patient agreed with the plan and demonstrated an understanding of the instructions.   The patient was advised to call back or seek an in-person evaluation if the symptoms worsen  or if the condition fails to improve as anticipated.  I provided 18 minutes of non-face-to-face time during this encounter. Includes reviewing notes , labs and imaging   Kerin Perna, NP

## 2019-11-12 NOTE — Telephone Encounter (Signed)
Call to patient to check on status.  Offered visit. Patient refused. Would prefer telephone communication. Received "bad news" at [facility] on Friday however, does not want to discuss at this time. Provided emotional support and active listening. Reminded patient of upcoming MD appointments.  Encouraged patient to go to [facility] to access services.   Reminded patient of urgency to find housing alternative.  Patient verbalized understanding.

## 2019-11-13 ENCOUNTER — Telehealth: Payer: Self-pay

## 2019-11-13 NOTE — Telephone Encounter (Signed)
Multiple phone calls today from patient regarding resources.  Has spoken with [place] in [place] today and plans to follow up with her.  Spoke with Therapist, music, Water engineer.  Continue to encourage him to go to [facility] to access resources.  Patient continues to refuse RN visit. Would like to proceed with surgical plan with [facility].  Encouraged him to call Nurse Coordinator Ada with his questions. Offered emotional support and supportive listening.

## 2019-11-14 ENCOUNTER — Telehealth: Payer: Self-pay

## 2019-11-14 ENCOUNTER — Other Ambulatory Visit: Payer: Self-pay

## 2019-11-14 ENCOUNTER — Ambulatory Visit: Payer: Self-pay | Attending: Family Medicine | Admitting: Licensed Clinical Social Worker

## 2019-11-14 DIAGNOSIS — Z599 Problem related to housing and economic circumstances, unspecified: Secondary | ICD-10-CM

## 2019-11-14 NOTE — Telephone Encounter (Signed)
Call placed to the patient with George See, LCSW.   Patient explained that he is currently staying at [facility] and he is not pleased with the environment there.  He is concerned about his medical issues and need for surgery but was focused on housing.  He said that he has an income but did not know how much he receives.  He also noted that he has his own transportation.  He has been caring for his PEG tube and has been using the osmolite that he received from the hospital.  Informed him that we could provide him with a listing of available housing through [email or web address]  He noted that his limit for housing expense is $600/month. He said that he does not have wifi where he is and would be able to stop by [facility] to pick up this information. Informed him that it would be left at the front desk.   The listings from [employer] as well as the phone numbers for [facility], [employer] and [employer] [facility]  - coordinated re-entry were also provided with this information.    If needed he provided his email: [email or web address] He also said that he can receive text messages on his phone.   Call placed to [facility] and provided her with an update from the call this morning.  She can remind the patient that he can pick up the housing information at [facility].  Also discussed the option of patient going to an extended stay motel as he has an income.

## 2019-11-14 NOTE — BH Specialist Note (Signed)
Integrated Behavioral Health Visit via Telemedicine (Telephone)  11/14/2019 George Wade. [id number]   Session Start time: 11:10 AM  Session End time: 11:40 AM Total time: 30  Referring Provider: NP Oletta Lamas Type of Visit: Telephonic Patient location: Home [facility] Provider location: Office All persons participating in visit: LCSW, Patient, and RN CM, George Wade  Confirmed patient's address: Yes  Confirmed patient's phone number: Yes  Any changes to demographics: No   Confirmed patient's insurance: Yes  Any changes to patient's insurance: No   Discussed confidentiality: Yes    The following statements were read to the patient and/or legal guardian that are established with the [facility] Provider.  "The purpose of this phone visit is to provide behavioral health care while limiting exposure to the coronavirus (COVID19).  There is a possibility of technology failure and discussed alternative modes of communication if that failure occurs."  "By engaging in this telephone visit, you consent to the provision of healthcare.  Additionally, you authorize for your insurance to be billed for the services provided during this telephone visit."   Patient and/or legal guardian consented to telephone visit: Yes   PRESENTING CONCERNS: Patient and/or family reports the following symptoms/concerns: Pt reports difficulty managing physical and mental health due to ongoing psychosocial stressors. Pt is currently residing at [facility] and describes it as poor living conditions (no clean sheets or hot water) Duration of problem: 10 days; Severity of problem: moderate  STRENGTHS (Protective Factors/Coping Skills): Pt has income Pt is receiving services through [employer] Nurse Program  GOALS ADDRESSED: Patient will: 1.  Reduce symptoms of: stress  2.  Increase knowledge and/or ability of: stress reduction  3.  Demonstrate ability to: Increase healthy adjustment to current life  circumstances and Increase adequate support systems for patient/family  INTERVENTIONS: Interventions utilized:  Solution-Focused Strategies Standardized Assessments completed: Not Needed  ASSESSMENT: Patient currently experiencing difficulty managing physical and mental health conditions triggered by financial strain and ongoing psychosocial stressors. Pt states living environment is not conducive to health.   Patient may benefit from linkage to community resources. Housing options were discussed and resources were provided for pick up at [employer]. RN CM agreed to send discussed resources to email, per pt request.   PLAN: 1. Follow up with behavioral health clinician on : Contact LCSW with any additional behavioral health and/or resource needs 2. Behavioral recommendations: Utilize resources provided to obtain safe and affordable housing 3. Referral(s): [place] ([place]) and [employer]:  Housing  George Wade, George Wade 11/23/2019 1:24 PM

## 2019-11-19 ENCOUNTER — Telehealth: Payer: Self-pay | Admitting: Oncology

## 2019-11-19 NOTE — Telephone Encounter (Signed)
Patient was scheduled for lab and to see MD on 11-20-19. Patient phoned on 11-19-19 and stated that he needed to cancel. Patient did not offer a reason and stated that he would phone and reschedule. MD aware.

## 2019-11-20 ENCOUNTER — Inpatient Hospital Stay: Payer: Self-pay | Admitting: Oncology

## 2019-11-20 ENCOUNTER — Inpatient Hospital Stay: Payer: Self-pay

## 2019-11-20 ENCOUNTER — Ambulatory Visit: Payer: Self-pay | Admitting: Thoracic Surgery (Cardiothoracic Vascular Surgery)

## 2019-11-22 ENCOUNTER — Telehealth: Payer: Self-pay

## 2019-11-22 NOTE — Telephone Encounter (Signed)
Call from [facility].  Patient was supposed to check out today.  Informed manager that hotel was covered through the [place] program until Monday 2/8. Patient paid himself for Monday night and Tuesday night.  Patient has been given resources for housing alternatives.  He stated he did pick up list of resources from [facility] last week.   Patient had been informed that [facility] program ended Monday 2/8.  He had stated he was going to [facility] on Tuesday 2/9.

## 2019-11-27 NOTE — Progress Notes (Signed)
11/27/19  CSW received call from [place] stating that patient had called, said his Peg tube was "ruptured" and was trying contact his home health agency nurse.   CSW Tawanna Solo with [facility] who reports when they attempted to initiate services at time of discharge patient reported he was fine and declined their services therefore they were never active with him. Butch Penny also states since they were offering charity services, and patient declined, it will be hard for him to potentially find home health services at a future time.   This information was relayed to Chaplain. CSW informed Chaplain that if patient needs medical attention he should go to emergency department.   [place], [place]

## 2019-11-29 ENCOUNTER — Encounter: Payer: Self-pay | Admitting: Oncology

## 2020-07-10 IMAGING — CR PORTABLE CHEST - 1 VIEW
1 series · 1 of 1 positions shown · non-contrast
Comparison: CT 12/09/2017

CLINICAL DATA: Weakness

EXAM:
PORTABLE CHEST 1 VIEW

[chest ap]
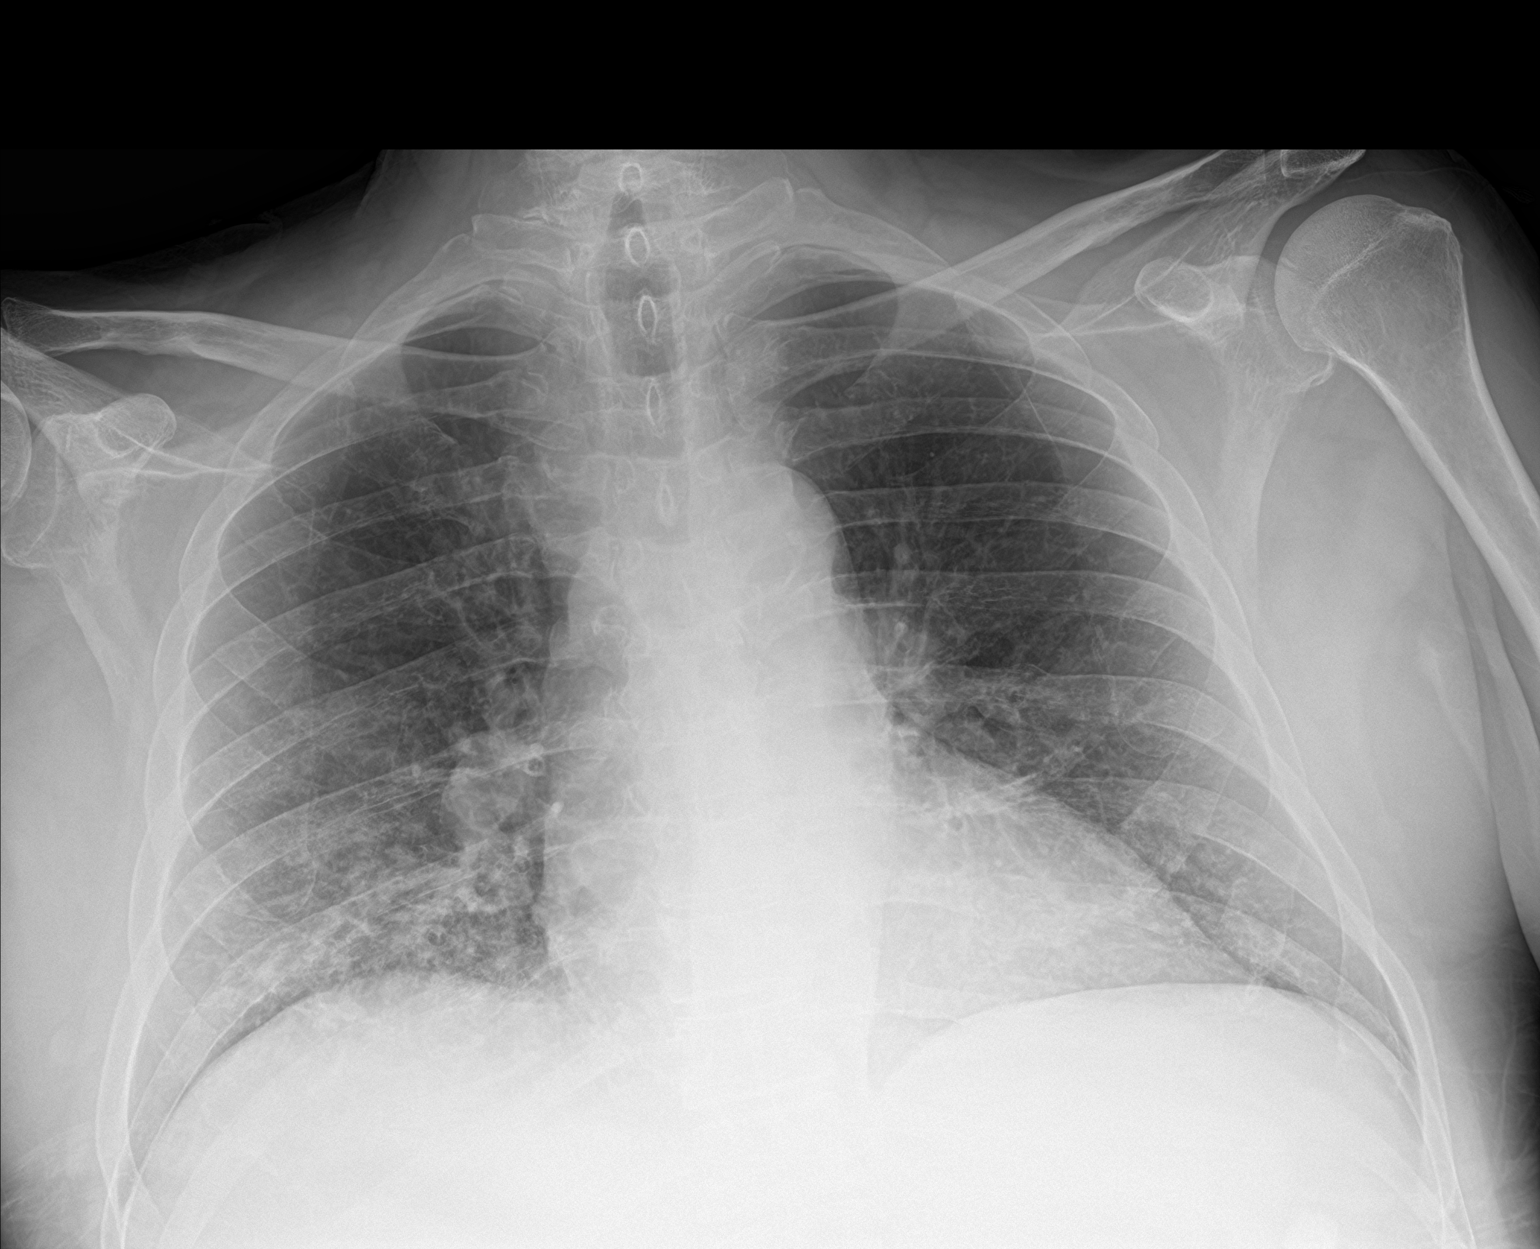

[1 of 1 positions shown; findings below may reference images not displayed]

FINDINGS: Mild reticular opacities at the bases. No consolidation or effusion.
Borderline heart size. No pneumothorax.
IMPRESSION: Mild reticular opacities in the right greater than left lung base,
could reflect atypical infection. Borderline cardiomegaly.

## 2020-07-10 IMAGING — CR LEFT KNEE - COMPLETE 4+ VIEW
4 series · 4 of 4 positions shown · non-contrast
Comparison: None.

CLINICAL DATA: Status post fall.

EXAM:
LEFT KNEE - COMPLETE 4+ VIEW

[knee ap]
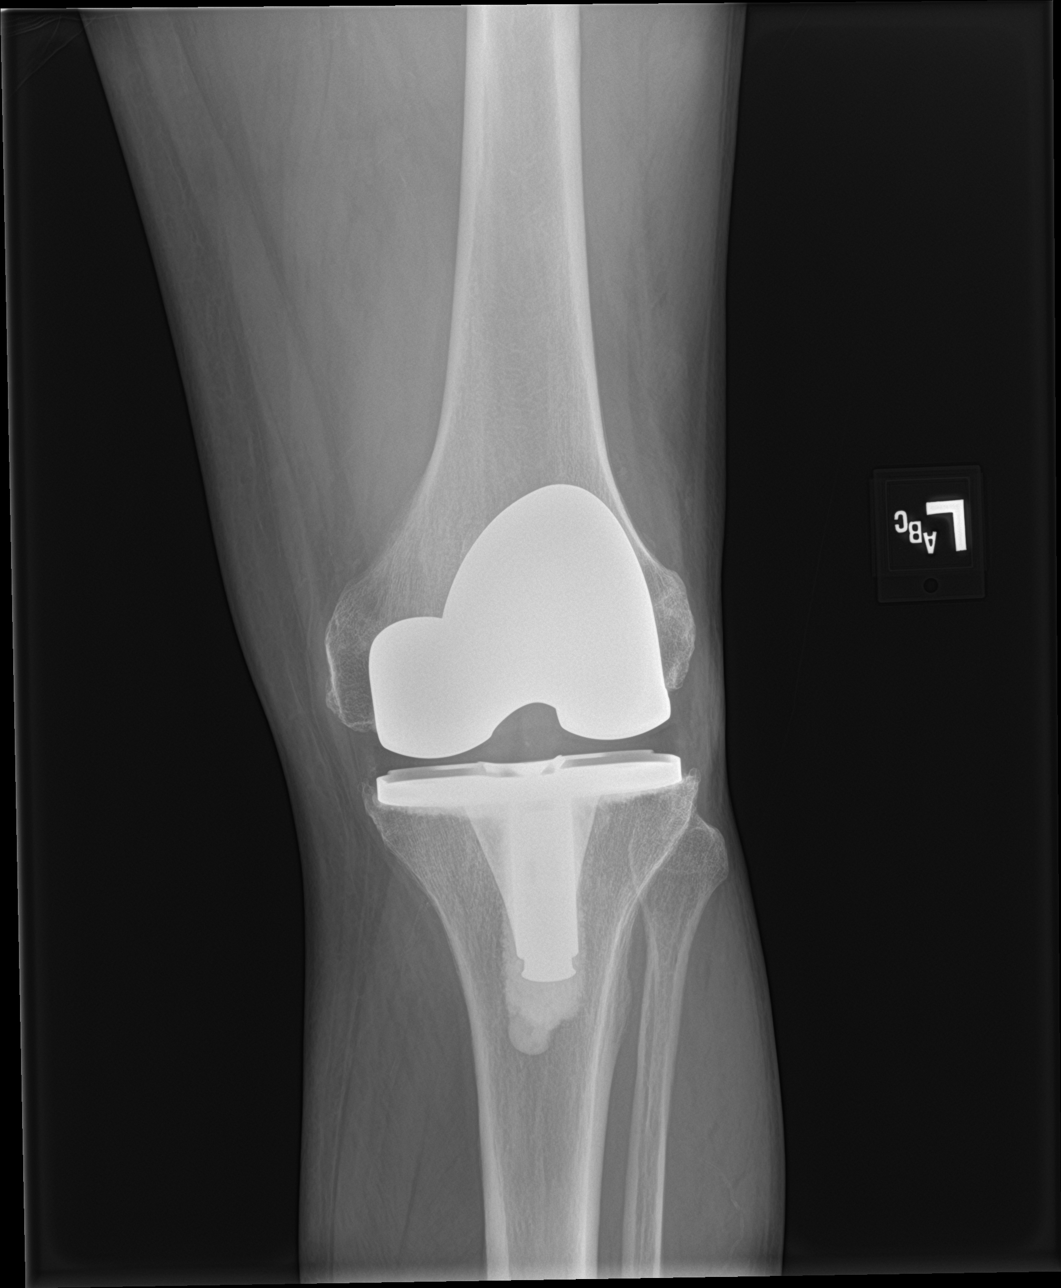

[knee obl (1 of 2)]
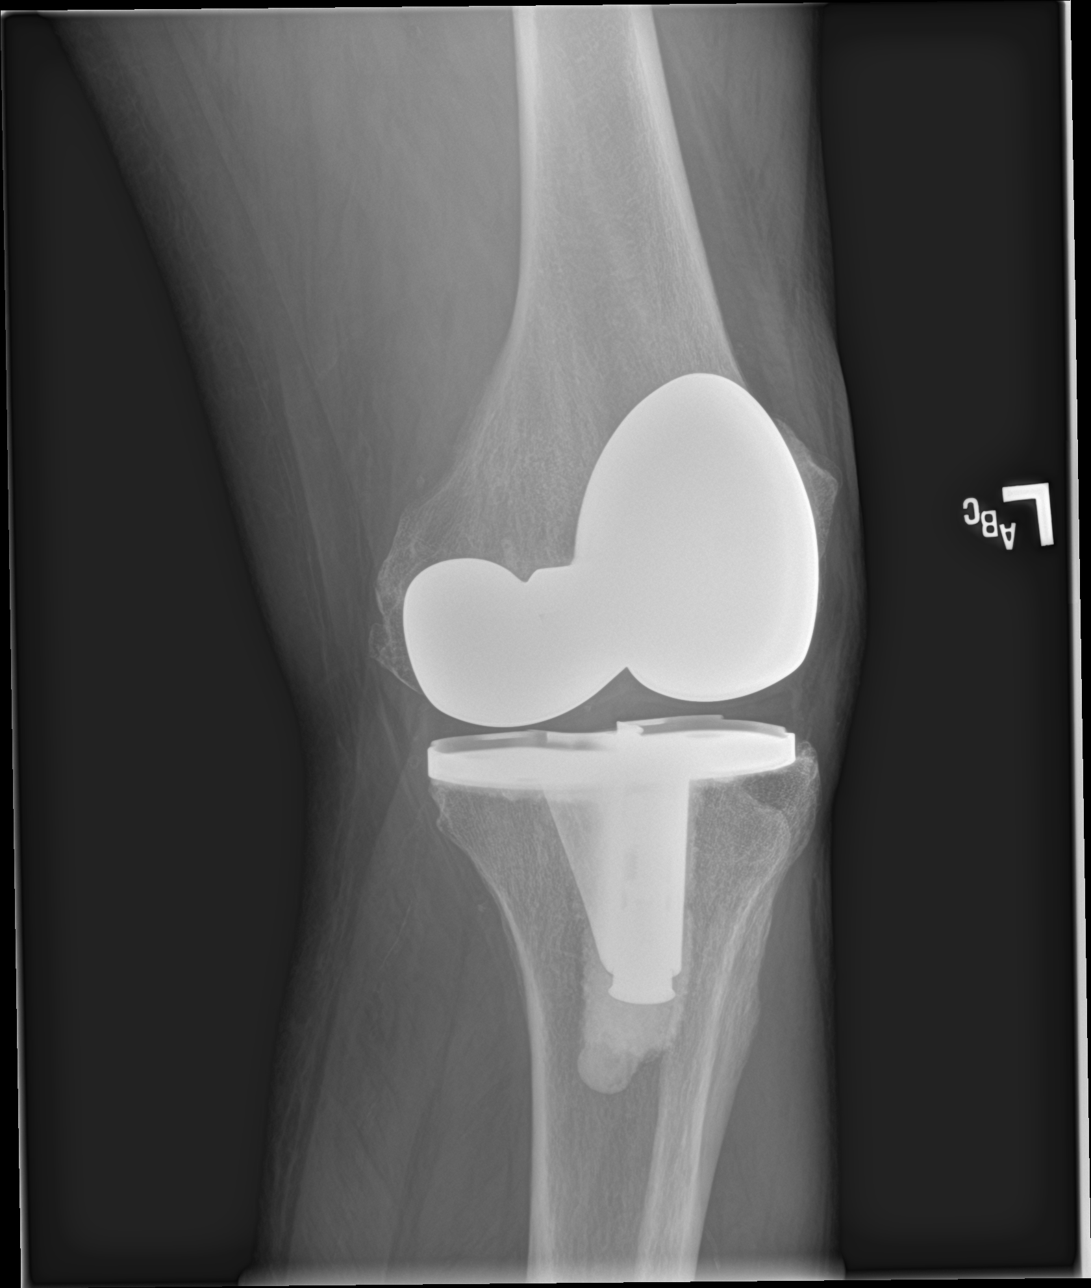

[knee obl (2 of 2)]
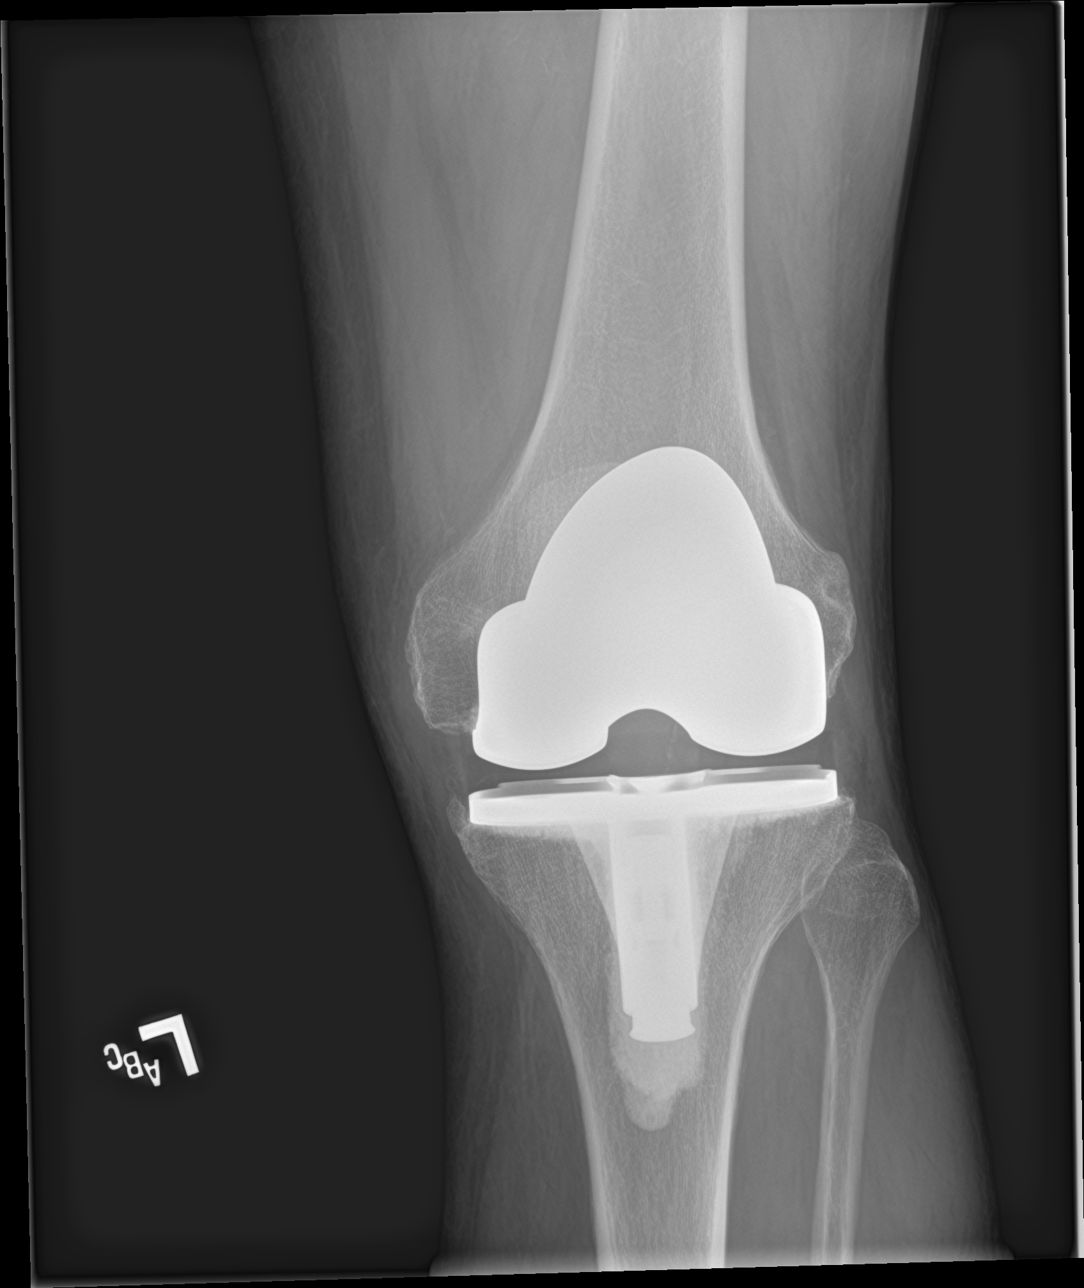

[knee lat]
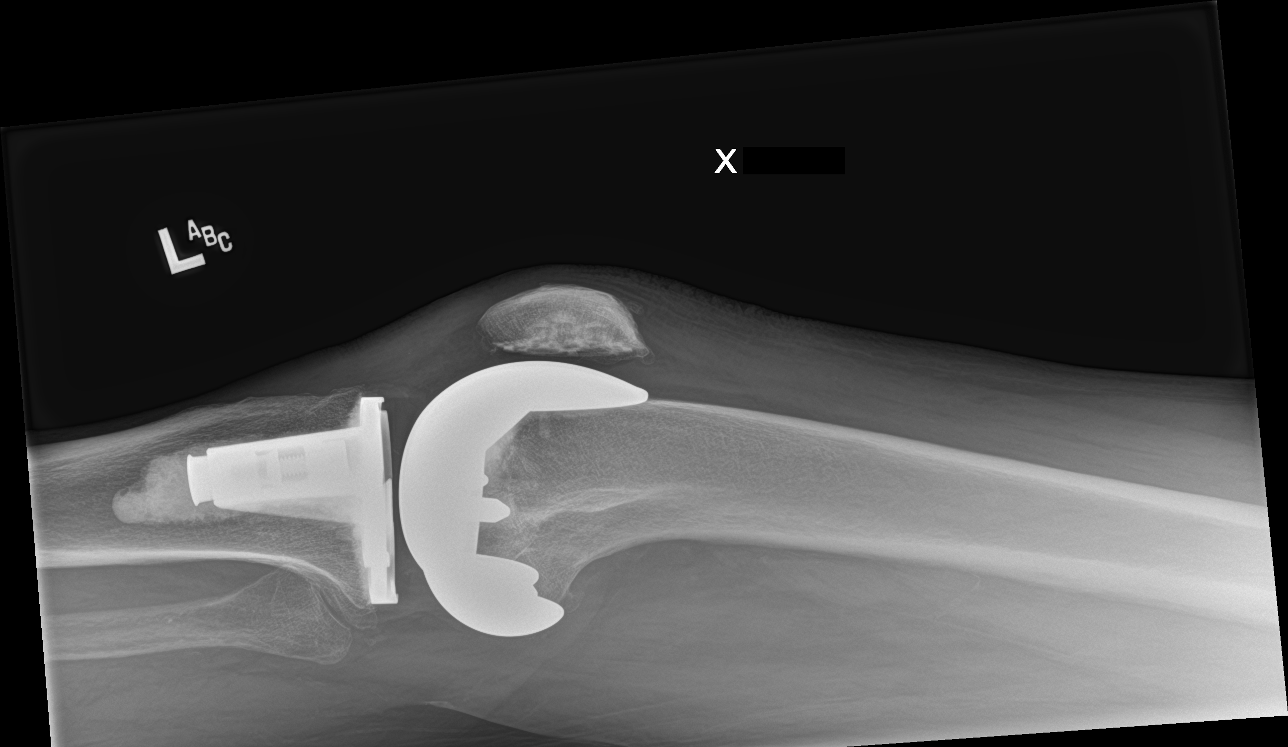

[4 of 4 positions shown; findings below may reference images not displayed]

FINDINGS: No evidence of fracture, or dislocation. Small suprapatellar joint
effusion. Post total left knee arthroplasty with normal alignment of
the orthopedic components and no evidence of loosening.

Mild suprapatellar soft tissue swelling.
IMPRESSION: 1. No acute fracture or dislocation identified about the left knee.
2. Small suprapatellar joint effusion.

## 2020-07-11 IMAGING — DX ABDOMEN - 1 VIEW
1 series · 1 of 1 positions shown · non-contrast
Comparison: None.

CLINICAL DATA: NG tube placement.

EXAM:
ABDOMEN - 1 VIEW

[abdomen supine]
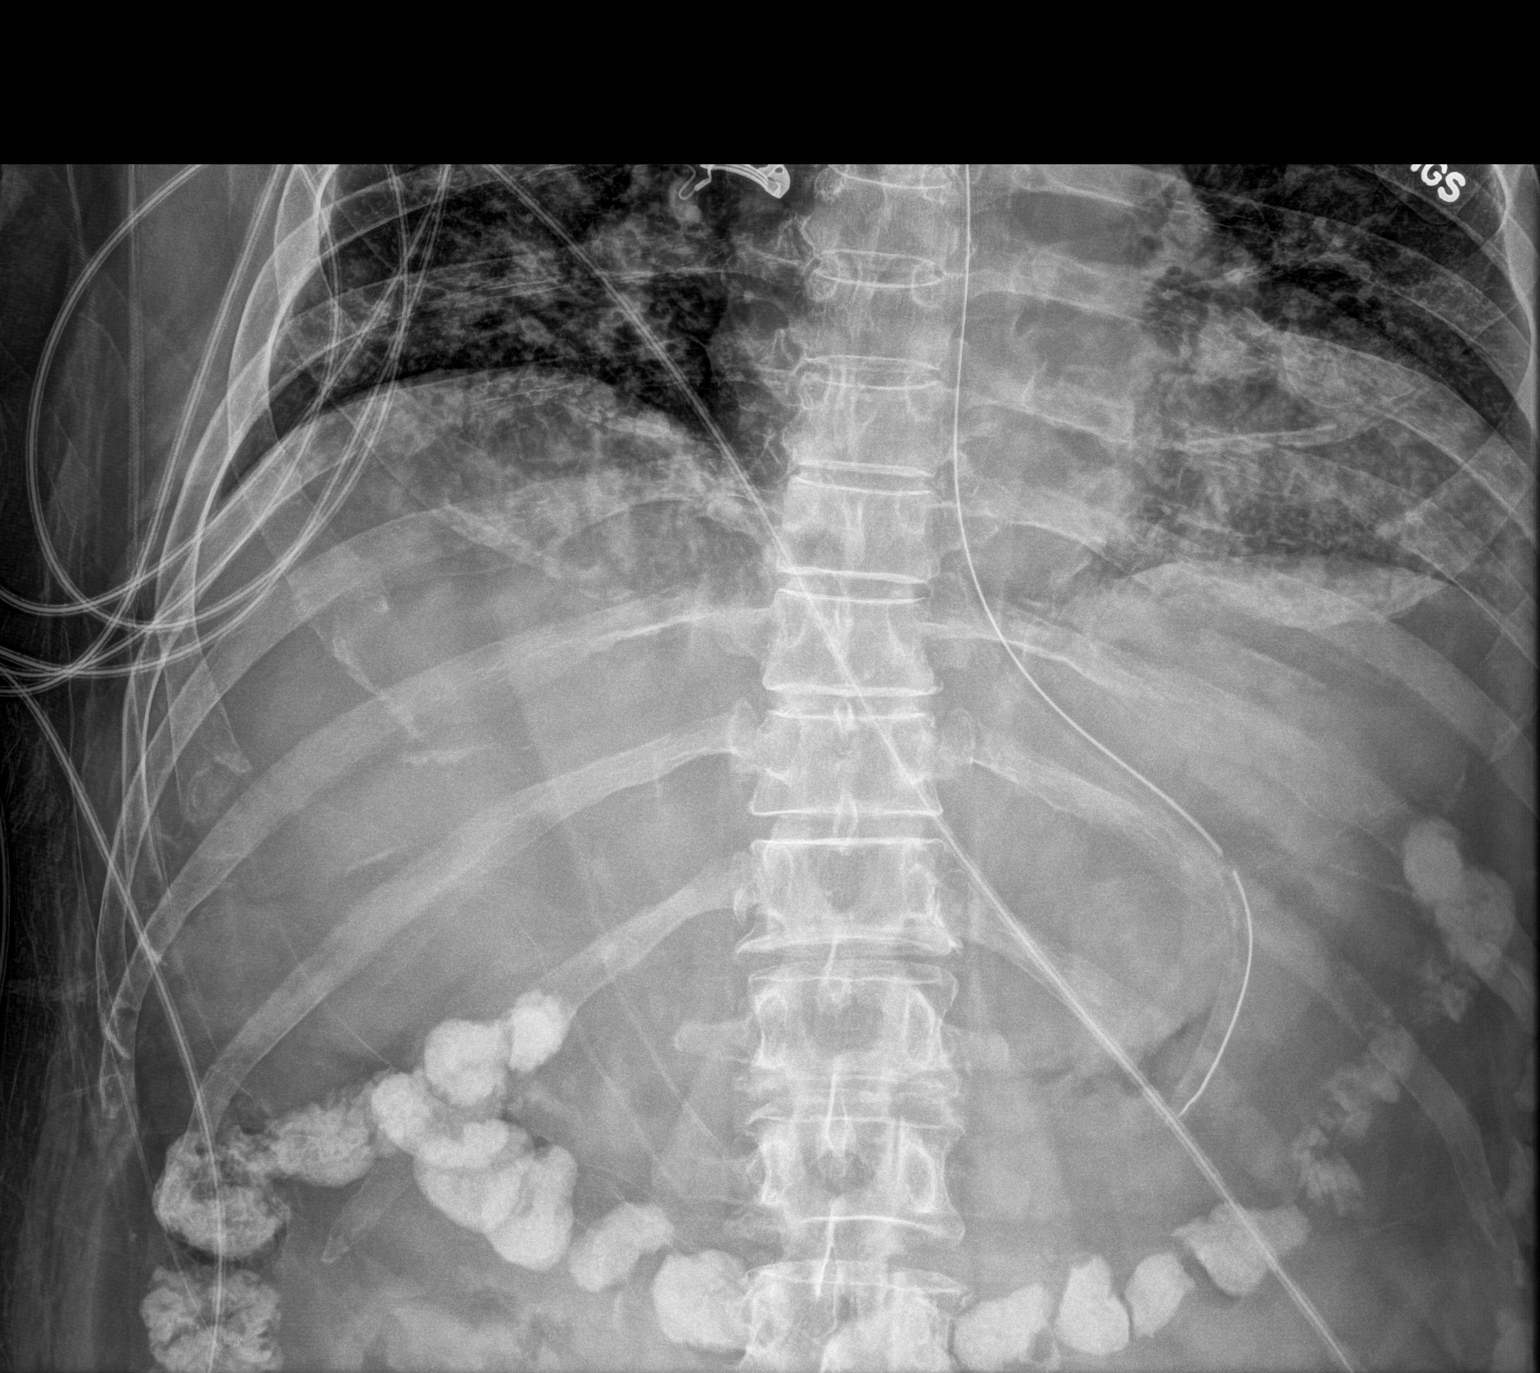

[1 of 1 positions shown; findings below may reference images not displayed]

FINDINGS: Tip and side port of the enteric tube below the diaphragm in the
stomach. High-density material in the colon suggestive of enteric
contrast. No bowel dilatation in the upper abdomen.
IMPRESSION: Tip and side port of the enteric tube below the diaphragm in the
stomach.

## 2021-07-27 ENCOUNTER — Encounter: Payer: Self-pay | Admitting: General Surgery
